# Patient Record
Sex: Female | Born: 1935 | Race: White | Hispanic: No | Smoking: Former smoker
Health system: Southern US, Community
[De-identification: ages and names within clinical notes are randomized; demographics above are authoritative.]

## PROBLEM LIST (undated history)

## (undated) DIAGNOSIS — Z9289 Personal history of other medical treatment: Secondary | ICD-10-CM

## (undated) DIAGNOSIS — G8929 Other chronic pain: Secondary | ICD-10-CM

## (undated) DIAGNOSIS — M1990 Unspecified osteoarthritis, unspecified site: Secondary | ICD-10-CM

## (undated) DIAGNOSIS — K219 Gastro-esophageal reflux disease without esophagitis: Secondary | ICD-10-CM

## (undated) DIAGNOSIS — I480 Paroxysmal atrial fibrillation: Secondary | ICD-10-CM

## (undated) DIAGNOSIS — I2510 Atherosclerotic heart disease of native coronary artery without angina pectoris: Secondary | ICD-10-CM

## (undated) DIAGNOSIS — A0472 Enterocolitis due to Clostridium difficile, not specified as recurrent: Secondary | ICD-10-CM

## (undated) DIAGNOSIS — Z9889 Other specified postprocedural states: Secondary | ICD-10-CM

## (undated) DIAGNOSIS — E785 Hyperlipidemia, unspecified: Secondary | ICD-10-CM

## (undated) DIAGNOSIS — J189 Pneumonia, unspecified organism: Secondary | ICD-10-CM

## (undated) DIAGNOSIS — M109 Gout, unspecified: Secondary | ICD-10-CM

## (undated) DIAGNOSIS — R112 Nausea with vomiting, unspecified: Secondary | ICD-10-CM

## (undated) DIAGNOSIS — D649 Anemia, unspecified: Secondary | ICD-10-CM

## (undated) DIAGNOSIS — I10 Essential (primary) hypertension: Secondary | ICD-10-CM

## (undated) DIAGNOSIS — I509 Heart failure, unspecified: Secondary | ICD-10-CM

## (undated) DIAGNOSIS — I6529 Occlusion and stenosis of unspecified carotid artery: Secondary | ICD-10-CM

## (undated) DIAGNOSIS — J449 Chronic obstructive pulmonary disease, unspecified: Secondary | ICD-10-CM

## (undated) DIAGNOSIS — A020 Salmonella enteritis: Secondary | ICD-10-CM

## (undated) DIAGNOSIS — Z8719 Personal history of other diseases of the digestive system: Secondary | ICD-10-CM

## (undated) DIAGNOSIS — I4891 Unspecified atrial fibrillation: Principal | ICD-10-CM

## (undated) DIAGNOSIS — N189 Chronic kidney disease, unspecified: Secondary | ICD-10-CM

## (undated) DIAGNOSIS — K759 Inflammatory liver disease, unspecified: Secondary | ICD-10-CM

## (undated) DIAGNOSIS — M549 Dorsalgia, unspecified: Secondary | ICD-10-CM

## (undated) DIAGNOSIS — M810 Age-related osteoporosis without current pathological fracture: Secondary | ICD-10-CM

## (undated) HISTORY — DX: Enterocolitis due to Clostridium difficile, not specified as recurrent: A04.72

## (undated) HISTORY — PX: CAROTID ENDARTERECTOMY: SUR193

## (undated) HISTORY — PX: EYE SURGERY: SHX253

## (undated) HISTORY — DX: Pneumonia, unspecified organism: J18.9

## (undated) HISTORY — DX: Paroxysmal atrial fibrillation: I48.0

## (undated) HISTORY — PX: CATARACT EXTRACTION, BILATERAL: SHX1313

## (undated) HISTORY — DX: Anemia, unspecified: D64.9

## (undated) HISTORY — DX: Occlusion and stenosis of unspecified carotid artery: I65.29

## (undated) HISTORY — DX: Salmonella enteritis: A02.0

## (undated) HISTORY — DX: Personal history of other medical treatment: Z92.89

## (undated) HISTORY — PX: OTHER SURGICAL HISTORY: SHX169

---

## 1998-03-09 ENCOUNTER — Encounter: Payer: Self-pay | Admitting: Emergency Medicine

## 1998-03-09 ENCOUNTER — Encounter: Payer: Self-pay | Admitting: Internal Medicine

## 1998-03-09 ENCOUNTER — Emergency Department (HOSPITAL_COMMUNITY): Admission: EM | Admit: 1998-03-09 | Discharge: 1998-03-09 | Payer: Self-pay | Admitting: Internal Medicine

## 1998-05-04 ENCOUNTER — Encounter: Admission: RE | Admit: 1998-05-04 | Discharge: 1998-05-26 | Payer: Self-pay | Admitting: Orthopaedic Surgery

## 1998-07-10 ENCOUNTER — Other Ambulatory Visit: Admission: RE | Admit: 1998-07-10 | Discharge: 1998-07-10 | Payer: Self-pay | Admitting: *Deleted

## 1999-01-12 ENCOUNTER — Ambulatory Visit (HOSPITAL_COMMUNITY): Admission: RE | Admit: 1999-01-12 | Discharge: 1999-01-12 | Payer: Self-pay | Admitting: *Deleted

## 1999-09-09 ENCOUNTER — Encounter: Payer: Self-pay | Admitting: *Deleted

## 1999-09-09 ENCOUNTER — Encounter: Admission: RE | Admit: 1999-09-09 | Discharge: 1999-09-09 | Payer: Self-pay | Admitting: *Deleted

## 1999-09-13 ENCOUNTER — Other Ambulatory Visit: Admission: RE | Admit: 1999-09-13 | Discharge: 1999-09-13 | Payer: Self-pay | Admitting: *Deleted

## 2000-04-12 ENCOUNTER — Encounter: Payer: Self-pay | Admitting: Internal Medicine

## 2000-04-12 ENCOUNTER — Encounter: Admission: RE | Admit: 2000-04-12 | Discharge: 2000-04-12 | Payer: Self-pay | Admitting: Internal Medicine

## 2001-02-16 ENCOUNTER — Ambulatory Visit (HOSPITAL_COMMUNITY): Admission: RE | Admit: 2001-02-16 | Discharge: 2001-02-16 | Payer: Self-pay | Admitting: Gastroenterology

## 2002-03-14 ENCOUNTER — Encounter: Payer: Self-pay | Admitting: Internal Medicine

## 2002-03-14 ENCOUNTER — Encounter: Admission: RE | Admit: 2002-03-14 | Discharge: 2002-03-14 | Payer: Self-pay | Admitting: Internal Medicine

## 2002-05-15 ENCOUNTER — Encounter: Admission: RE | Admit: 2002-05-15 | Discharge: 2002-05-15 | Payer: Self-pay | Admitting: Internal Medicine

## 2002-05-15 ENCOUNTER — Encounter: Payer: Self-pay | Admitting: Internal Medicine

## 2002-07-11 ENCOUNTER — Encounter: Admission: RE | Admit: 2002-07-11 | Discharge: 2002-07-11 | Payer: Self-pay | Admitting: Internal Medicine

## 2002-07-11 ENCOUNTER — Encounter: Payer: Self-pay | Admitting: Internal Medicine

## 2003-07-10 ENCOUNTER — Encounter: Admission: RE | Admit: 2003-07-10 | Discharge: 2003-07-10 | Payer: Self-pay | Admitting: Internal Medicine

## 2003-11-24 ENCOUNTER — Inpatient Hospital Stay (HOSPITAL_BASED_OUTPATIENT_CLINIC_OR_DEPARTMENT_OTHER): Admission: RE | Admit: 2003-11-24 | Discharge: 2003-11-24 | Payer: Self-pay | Admitting: Cardiology

## 2003-12-24 ENCOUNTER — Ambulatory Visit (HOSPITAL_COMMUNITY): Admission: RE | Admit: 2003-12-24 | Discharge: 2003-12-24 | Payer: Self-pay | Admitting: Internal Medicine

## 2004-04-07 ENCOUNTER — Encounter: Admission: RE | Admit: 2004-04-07 | Discharge: 2004-04-07 | Payer: Self-pay | Admitting: Internal Medicine

## 2004-04-10 ENCOUNTER — Encounter: Admission: RE | Admit: 2004-04-10 | Discharge: 2004-04-10 | Payer: Self-pay | Admitting: Internal Medicine

## 2004-04-21 ENCOUNTER — Inpatient Hospital Stay (HOSPITAL_COMMUNITY): Admission: RE | Admit: 2004-04-21 | Discharge: 2004-04-22 | Payer: Self-pay | Admitting: Otolaryngology

## 2004-04-22 ENCOUNTER — Encounter (INDEPENDENT_AMBULATORY_CARE_PROVIDER_SITE_OTHER): Payer: Self-pay | Admitting: Specialist

## 2004-12-14 ENCOUNTER — Encounter: Admission: RE | Admit: 2004-12-14 | Discharge: 2004-12-14 | Payer: Self-pay | Admitting: Internal Medicine

## 2005-05-20 ENCOUNTER — Encounter: Admission: RE | Admit: 2005-05-20 | Discharge: 2005-05-20 | Payer: Self-pay | Admitting: Internal Medicine

## 2006-03-07 HISTORY — PX: JOINT REPLACEMENT: SHX530

## 2007-01-04 ENCOUNTER — Ambulatory Visit: Payer: Self-pay | Admitting: *Deleted

## 2007-04-09 ENCOUNTER — Inpatient Hospital Stay (HOSPITAL_COMMUNITY): Admission: RE | Admit: 2007-04-09 | Discharge: 2007-04-13 | Payer: Self-pay | Admitting: Orthopedic Surgery

## 2007-04-18 ENCOUNTER — Emergency Department (HOSPITAL_COMMUNITY): Admission: EM | Admit: 2007-04-18 | Discharge: 2007-04-18 | Payer: Self-pay | Admitting: Emergency Medicine

## 2007-07-05 ENCOUNTER — Ambulatory Visit: Payer: Self-pay | Admitting: *Deleted

## 2007-12-27 ENCOUNTER — Ambulatory Visit: Payer: Self-pay | Admitting: *Deleted

## 2008-06-20 ENCOUNTER — Ambulatory Visit: Payer: Self-pay | Admitting: *Deleted

## 2008-12-25 ENCOUNTER — Ambulatory Visit: Payer: Self-pay | Admitting: Surgery

## 2008-12-29 ENCOUNTER — Ambulatory Visit: Payer: Self-pay | Admitting: Surgery

## 2009-01-06 ENCOUNTER — Ambulatory Visit: Payer: Self-pay | Admitting: Surgery

## 2009-01-06 ENCOUNTER — Ambulatory Visit (HOSPITAL_COMMUNITY): Admission: RE | Admit: 2009-01-06 | Discharge: 2009-01-06 | Payer: Self-pay | Admitting: Surgery

## 2009-01-19 ENCOUNTER — Ambulatory Visit: Payer: Self-pay | Admitting: Surgery

## 2009-02-06 ENCOUNTER — Inpatient Hospital Stay (HOSPITAL_COMMUNITY): Admission: RE | Admit: 2009-02-06 | Discharge: 2009-02-07 | Payer: Self-pay | Admitting: Surgery

## 2009-02-06 ENCOUNTER — Ambulatory Visit: Payer: Self-pay | Admitting: Surgery

## 2009-02-06 ENCOUNTER — Encounter: Payer: Self-pay | Admitting: Surgery

## 2009-02-23 ENCOUNTER — Ambulatory Visit: Payer: Self-pay | Admitting: Surgery

## 2009-08-31 ENCOUNTER — Ambulatory Visit: Payer: Self-pay | Admitting: Surgery

## 2010-02-15 ENCOUNTER — Ambulatory Visit: Payer: Self-pay | Admitting: Surgery

## 2010-06-08 LAB — TYPE AND SCREEN
ABO/RH(D): O POS
Antibody Screen: POSITIVE
DAT, IgG: NEGATIVE
Donor AG Type: NEGATIVE

## 2010-06-08 LAB — CBC
HCT: 28.3 % — ABNORMAL LOW (ref 36.0–46.0)
Hemoglobin: 11.4 g/dL — ABNORMAL LOW (ref 12.0–15.0)
Hemoglobin: 9.6 g/dL — ABNORMAL LOW (ref 12.0–15.0)
MCHC: 34.1 g/dL (ref 30.0–36.0)
MCV: 94.3 fL (ref 78.0–100.0)
RDW: 13.7 % (ref 11.5–15.5)
WBC: 7.8 10*3/uL (ref 4.0–10.5)

## 2010-06-08 LAB — COMPREHENSIVE METABOLIC PANEL
ALT: 12 U/L (ref 0–35)
AST: 25 U/L (ref 0–37)
Albumin: 3.6 g/dL (ref 3.5–5.2)
Alkaline Phosphatase: 69 U/L (ref 39–117)
Calcium: 9.1 mg/dL (ref 8.4–10.5)
GFR calc Af Amer: 47 mL/min — ABNORMAL LOW (ref >60)
Glucose, Bld: 142 mg/dL — ABNORMAL HIGH (ref 70–99)
Potassium: 4 mEq/L (ref 3.5–5.1)
Sodium: 143 mEq/L (ref 135–145)
Total Protein: 6.3 g/dL (ref 6.0–8.3)

## 2010-06-08 LAB — URINALYSIS, ROUTINE W REFLEX MICROSCOPIC
Glucose, UA: NEGATIVE mg/dL
Hgb urine dipstick: NEGATIVE
Specific Gravity, Urine: 1.013 (ref 1.005–1.030)
pH: 5 (ref 5.0–8.0)

## 2010-06-08 LAB — BLOOD GAS, ARTERIAL
Bicarbonate: 21.3 mEq/L (ref 20.0–24.0)
O2 Saturation: 95.5 %
Patient temperature: 98.6
TCO2: 22.1 mmol/L (ref 0–100)
pH, Arterial: 7.489 — ABNORMAL HIGH (ref 7.350–7.400)
pO2, Arterial: 81.5 mmHg (ref 80.0–100.0)

## 2010-06-08 LAB — BASIC METABOLIC PANEL
Chloride: 107 mEq/L (ref 96–112)
GFR calc non Af Amer: 41 mL/min — ABNORMAL LOW (ref >60)
Potassium: 4.6 mEq/L (ref 3.5–5.1)
Sodium: 137 mEq/L (ref 135–145)

## 2010-06-08 LAB — PROTIME-INR: Prothrombin Time: 12.9 seconds (ref 11.6–15.2)

## 2010-06-09 LAB — POCT I-STAT, CHEM 8
BUN: 24 mg/dL — ABNORMAL HIGH (ref 6–23)
Calcium, Ion: 1.18 mmol/L (ref 1.12–1.32)
Chloride: 114 mEq/L — ABNORMAL HIGH (ref 96–112)
Creatinine, Ser: 1.3 mg/dL — ABNORMAL HIGH (ref 0.4–1.2)
Glucose, Bld: 107 mg/dL — ABNORMAL HIGH (ref 70–99)
TCO2: 23 mmol/L (ref 0–100)

## 2010-07-20 NOTE — Procedures (Signed)
CAROTID DUPLEX EXAM   INDICATION:  Follow up carotid artery disease.   HISTORY:  Diabetes:  No.  Cardiac:  No.  Hypertension:  Yes.  Smoking:  No.  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  CV History:  Asymptomatic.  Amaurosis Fugax No, Paresthesias No, Hemiparesis No.                                       RIGHT             LEFT  Brachial systolic pressure:         168               170  Brachial Doppler waveforms:         Normal            Normal  Vertebral direction of flow:        Antegrade         Antegrade  DUPLEX VELOCITIES (cm/sec)  CCA peak systolic                   78                121  ECA peak systolic                   282               111  ICA peak systolic                   294               103  ICA end diastolic                   69                17  PLAQUE MORPHOLOGY:                  Mixed             Calcific  PLAQUE AMOUNT:                      Moderate/severe   Mild  PLAQUE LOCATION:                    ICA/ECA           ICA   IMPRESSION:  1. High-end 60-79% stenosis of the right internal carotid artery.  2. 1-39% stenosis of the left internal carotid artery.  3. No significant change noted when compared to the previous      examination on 07/05/2007.   ___________________________________________  P. Liliane Bade, M.D.   CH/MEDQ  D:  12/27/2007  T:  12/28/2007  Job:  [id number]

## 2010-07-20 NOTE — Procedures (Signed)
CAROTID DUPLEX EXAM   INDICATION:  Followup carotid artery disease.   HISTORY:  Diabetes:  No.  Cardiac:  No.  Hypertension:  Yes.  Smoking:  No.  Previous Surgery:  Bilateral carotid endarterectomies 1998, left neck  tumor removed February 2006.  CV History:  No.  Amaurosis Fugax No, Paresthesias No, Hemiparesis No                                       RIGHT               LEFT  Brachial systolic pressure:         180                 170  Brachial Doppler waveforms:         WNL                 WNL  Vertebral direction of flow:        Antegrade           Antegrade  DUPLEX VELOCITIES (cm/sec)  CCA peak systolic                   72                  97  ECA peak systolic                   521                 102  ICA peak systolic                   403                 100  ICA end diastolic                   101                 23  PLAQUE MORPHOLOGY:                  Mixed               Calcific  PLAQUE AMOUNT:                      Moderate/severe     Mild  PLAQUE LOCATION:                    ICA/CCA/bifurcation ICA   IMPRESSION:  1. Right ICA shows evidence of 60-79% stenosis (high end of range).  2. Velocities appear higher than previous study, however, no category      change.  3. Left ICA shows evidence of 1-39% stenosis.  4. Right ECA stenosis.   ___________________________________________  P. Liliane Bade, M.D.   AS/MEDQ  D:  06/20/2008  T:  06/20/2008  Job:  [id number]

## 2010-07-20 NOTE — Op Note (Signed)
NAME:  [name] [name], [name] [name]                ACCOUNT NO.:  [id number]   MEDICAL RECORD NO.:  [id number]          PATIENT TYPE:  INP   LOCATION:  NA                           FACILITY:  [facility]   PHYSICIAN:  Feliberto Gottron. Turner Daniels, M.D.   DATE OF BIRTH:  [birth year]   DATE OF PROCEDURE:  04/09/2007  DATE OF DISCHARGE:                               OPERATIVE REPORT   PREOPERATIVE DIAGNOSIS:  End-stage arthritis of right knee,  tricompartmental   POSTOPERATIVE DIAGNOSIS:  End-stage arthritis of right knee,  tricompartmental   PROCEDURE:  Right total knee arthroplasty, using DePuy Sigma RP  components, 2 femur, 3 tibia, 10 PF spacer, 35 mm patellar button, all  cemented, double batch of DePuy HV cement with 1500 mg of Zinacef.   SURGEON:  Feliberto Gottron.  Turner Daniels, M.D.   FIRST ASSISTANT:  Erskine Squibb B. Su Hilt, P.A.-C.   ANESTHETIC:  General endotracheal.   ESTIMATED BLOOD LOSS:  Minimal.   FLUID REPLACEMENT:  1 liter of crystalloid.   DRAINS PLACED:  Foley catheter, and 2 medium Hemovacs.   URINE OUTPUT:  200 mL.   TOURNIQUET TIME:  1 hour.   INDICATIONS FOR PROCEDURE:  An 77 year old woman with end-stage, bone-on-  bone arthritic changes of her right knee, who has failed conservative  treatment with antiinflammatory medicines, exercises, observation,  cortisone injections, and now desires elective total knee arthroplasty  to decrease pain and increase function.  Risks and benefits of surgery  were discussed, and questions answered.   DESCRIPTION OF PROCEDURE:  The patient was identified by armband and  taken to the operating room at [facility].  Appropriate  anesthetic monitors were attached, and general endotracheal anesthesia  was induced.  She did receive IV antibiotics in the preoperative area.  A Foley catheter was inserted.  Tourniquet applied high to the right  thigh.  Lateral post and foot positioner were applied to the table, and  the right lower extremity prepped and draped in the usual  sterile  fashion from the ankle to the tourniquet.  Limb wrapped with an Esmarch  bandage, tourniquet inflated to 350 mmHg.  We began the procedure by  making an anterior midline incision centered over the patella, starting  a handbreadth above the patella and going 1 cm medial to and 2 cm distal  to the tibial tubercle.  Small bleeders in the skin and subcutaneous  tissue were identified and cauterized, and a medial parapatellar  arthrotomy was accomplished.  The patella was everted.  The prepatellar  fat pad was resected.  Superficial medial collateral ligament was  elevated off the proximal tibia, going from anterior to posterior, and  leaving it intact distally about 5 cm down the metaphyseal flare.  The  knee was then hyperflexed, allowing [place] to remove the anterior one-half  of the menisci and to further peel the superficial medial collateral  ligament down back to the posteromedial corner.  At this point, a  posteromedial Z retractor was placed, a McHale through the notch and a  lateral Personal assistant.  This exposed the proximal tibia.  The  cruciate  ligaments were resected with the electrocautery, and we entered  the proximal tibia between the tibial spines with the DePuy step drill,  followed by the IM rod with a 0-degree posterior slope cutting guide.  This allowed resection of about 8-9 mm of bone medially and laterally,  since the arthritis was evenly distributed between the compartments,  using a 0-degree posterior slope cutting guide which was pinned in place  and protecting the posterior structures with the retractors.  We then  entered the distal femur with the step drill 2 mm anterior to the PCL  origin, followed by an IM rod and a 5-degree right distal femoral  cutting guide.  This was pinned in place along the epicondylar axis,  allowing [place] to remove 11 mm of the distal femur.  We then sized for a #2  femoral component using the posterior referencing cutting guide  and  pinned it in neutral rotation, and now applied the #2 chamfer cutting  guide and performed the anterior, posterior, and chamfer cuts without  difficulty, followed by the DePuy box cut.  The patella was measured at  24 mm and was felt to take either a 32 or a 35 button.  We set the  cutting guide at 16 and removed the posterior 8-8.5 mm of the patella  and drilled for a 35 button.  Once again, the knee was hyperflexed.  We  sized for a #3 tibial baseplate which was pinned into place, followed by  the smokestack reamer guide and the conical reaming.  We then hammered  into place the Delta fit keel and bullet tip and removed the pins from  the tibial baseplate trial.  A #2 right femoral component trial was then  hammered into place, and the lugs were drilled.  We inserted a 10 mm  sigma RP bearing, and the knee was reduced.  It came to full extension,  flexed to 130 degrees, with good ligamentous stability, and the patella  was noted to track with no thumb pressure whatsoever.  At this point,  the trial components were removed.  All bony surfaces were waterpiked  clean, dried with suction and sponges, and a double batch of DePuy HV  cement was mixed with 1500 mg Zinacef and applied to all bony and  metallic mating surfaces except for the posterior condyles of the femur  itself.  In order, we hammered into place a #3 tibial baseplate and  removed excess cement, a 2 right femoral component and removed excess  cement.  A 35 mm patellar button, and again removed excess cement.  The  10 mm PF spacer was then snapped into place and the knee once again  reduced, came to full extension, was held in 5 degrees of flexion, and  then pressure applied to the foot to compress the cement as it hardened.  Medium Hemovac drains were placed in the wound from a lateral approach.  and the wound was once again waterpiked clean.  After the cement had  cured, we took the knee through a range of motion with the  patella  reduced to make sure there were no tracking problems.  Again, no thumb  pressure was required.  The medial parapatellar arthrotomy was closed  with a running #1 Vicryl suture, the subcutaneous tissue with 0 and 2-0  undyed Vicryl suture, and the skin with skin staples.  A dressing of  Xeroform, 4 x 4 dressing, sponges, Webril, and Ace wrap were applied.  Tourniquet let down.  The patient was awakened and taken to the recovery  room without difficulty.      Feliberto Gottron. Turner Daniels, M.D.  Electronically Signed     FJR/MEDQ  D:  04/09/2007  T:  04/09/2007  Job:  [id number]

## 2010-07-20 NOTE — Procedures (Signed)
CAROTID DUPLEX EXAM   INDICATION:  Followup carotid artery disease.   HISTORY:  Diabetes:  No.  Cardiac:  No.  Hypertension:  Yes.  Smoking:  No.  Previous Surgery:  Right carotid endarterectomy in 1998, left carotid  endarterectomy in 1998, left neck tumor removed in February of 2006.  CV History:  No.  Amaurosis Fugax No, Paresthesias No, Hemiparesis No                                       RIGHT             LEFT  Brachial systolic pressure:         178               174  Brachial Doppler waveforms:         WNL               WNL  Vertebral direction of flow:        Antegrade         Antegrade  DUPLEX VELOCITIES (cm/sec)  CCA peak systolic                   64                77  ECA peak systolic                   182               75  ICA peak systolic                   358               84  ICA end diastolic                   92                21  PLAQUE MORPHOLOGY:                  Mixed             Calcific  PLAQUE AMOUNT:                      Large             Mild  PLAQUE LOCATION:                    ICA, ECA          ICA   IMPRESSION:  1. High end 60 to 79% stenosis of the right proximal ICA.  2. 20 to 39% stenosis of the left proximal ICA.  3. Right ECA stenosis noted.   ___________________________________________  P. Liliane Bade, M.D.   CH/MEDQ  D:  07/05/2007  T:  07/05/2007  Job:  [id number]

## 2010-07-20 NOTE — Procedures (Signed)
CAROTID DUPLEX EXAM   INDICATION:  Follow up carotid artery disease.   HISTORY:  Diabetes:  No.  Cardiac:  No.  Hypertension:  Yes.  Smoking:  No.  Previous Surgery: Redo  right carotid 02/06/2009. Bilateral carotid  endarterectomy in 1998.  Neck tumor removed February, 2006.  CV History:  No.  Amaurosis Fugax No, Paresthesias No, Hemiparesis No.                                       RIGHT             LEFT  Brachial systolic pressure:         128               130  Brachial Doppler waveforms:         WNL               WNL  Vertebral direction of flow:        Antegrade         Antegrade  DUPLEX VELOCITIES (cm/sec)  CCA peak systolic                   93                59  ECA peak systolic                   144               88  ICA peak systolic                   79                101  ICA end diastolic                   23                18  PLAQUE MORPHOLOGY:                                    Heterogenous  PLAQUE AMOUNT:                                        Mild  PLAQUE LOCATION:                                      ICA   IMPRESSION:  1. Patent right internal carotid artery status post carotid      endarterectomy with no evidence of restenosis.  2. Left internal carotid artery suggests 20% to 39% stenosis.  3. Antegrade flow in bilateral vertebrals.       ___________________________________________  V. Charlena Cross, MD   CB/MEDQ  D:  08/31/2009  T:  08/31/2009  Job:  [id number]

## 2010-07-20 NOTE — Assessment & Plan Note (Signed)
OFFICE VISIT   [name] [name], [name] [name]  DOB:  Jun 25, 1923                                       01/19/2009  ZOXWR#:[id number]   REASON FOR VISIT:  Follow up carotid stenosis.   HISTORY:  This is an 77 year old patient, former patient of Dr. Madilyn Fireman,  who is status post bilateral carotid endarterectomy in July and October  1998.  The right side required resection of a redundant internal carotid  artery.  Both arteries were repaired with a patch angioplasty, and they  were both done for asymptomatic disease.  She has had progression of  ultrasound on the right side.  She was taken for a carotid arteriogram  and found have greater than 90% stenosis on the right.  Her anatomy was  too tortuous to proceed with carotid stenting; therefore, she comes back  today for discussion of carotid endarterectomy.  She has had no new  neurological symptoms.   REVIEW OF SYSTEMS:  She does have shortness of breath with exertion,  pain in her legs with walking and some arthritis and joint pain as well  as wheezing.  Her other review of systems is negative.   PAST MEDICAL HISTORY:  Unchanged, including hypertension,  hypercholesterolemia, COPD.   SOCIAL HISTORY:  She continues to be a nonsmoker.  She quit 25 years  ago.   PHYSICAL EXAMINATION:  Heart rate 63, blood pressure 128/64, temperature  is 98.  She is well-appearing, in no distress.  Her neck incisions are  healed.  No carotid bruits.  Cardiovascular:  Regular rate and rhythm.  Abdomen is soft.  Neurologically she is intact without focal deficits.  Skin is without rash.  Psych:  She is alert and x3.  Musculoskeletal no  major deformities.   After again reviewing her carotid angiogram, we have had a long  discussion and have decided to proceed with a redo right carotid  endarterectomy.  We discussed the slightly-increased risk of nerve  injury and the risk of stroke with operation.  We have decided to  schedule this for  Friday, December 3.  All the patient's questions were  answered today.  She will continue her aspirin.  She is going to get a  Myoview for cardiac clearance.   Jorge Ny, MD  Electronically Signed   VWB/MEDQ  D:  01/19/2009  T:  01/20/2009  Job:  [id number]   cc:   Wilson Singer, M.D.  Dr. Ludwig Clarks

## 2010-07-20 NOTE — Assessment & Plan Note (Signed)
OFFICE VISIT   Bitton, Numa F  DOB:  September 06, 1923                                       02/23/2009  MVHQI#:[id number]   REASON FOR VISIT:  Followup.   HISTORY:  This is an 77 year old female who is status post bilateral  carotid endarterectomies in the late 1990s.  She has developed a  recurrence on the right side.  Based on her anatomy, she was not a  candidate for carotid stent.  Therefore, she underwent redo right  carotid endarterectomy with patch angioplasty.  The patient tolerated  her procedure well and was discharged home the following day.  She comes  back in today for follow-up.  She has not had any neurologic symptoms.   Her incision is well-healed.  The patient will be scheduled to come back  to the [facility] clinic in 6 months.  She will be placed back on our P scan  ultrasound protocol.  She has moderate disease on the left side, which  will need to be followed as well as the right.     Jorge Ny, MD  Electronically Signed   VWB/MEDQ  D:  02/23/2009  T:  02/25/2009  Job:  [id number]   cc:   Wilson Singer, M.D.  Dr. Ludwig Clarks

## 2010-07-20 NOTE — Procedures (Signed)
CAROTID DUPLEX EXAM   INDICATION:  Followup, carotid artery disease.   HISTORY:  Diabetes:  No.  Cardiac:  No.  Hypertension:  Yes.  Smoking:  No.  Previous Surgery:  Right carotid endarterectomy with DPA on 12/18/1996,  left carotid endarterectomy on 09/10/1996, both by Dr. Madilyn Fireman.  Patient  is also status post a removal of a left neck tumor in February, 2006.  CV History:  Amaurosis Fugax No, Paresthesias No, Hemiparesis No                                       RIGHT             LEFT  Brachial systolic pressure:         120               118  Brachial Doppler waveforms:         Biphasic          Biphasic  Vertebral direction of flow:        Antegrade         Antegrade  DUPLEX VELOCITIES (cm/sec)  CCA peak systolic                   64                95  ECA peak systolic                   218               86  ICA peak systolic                   402               99  ICA end diastolic                   89                26  PLAQUE MORPHOLOGY:                  Calcified         Calcified  PLAQUE AMOUNT:                      Large             Mild                      PLAQUE LOCATION:  ICA, ECA    ICA  Both ICAs are noted to be tortuous.   IMPRESSION:  1. 20-39% left internal carotid artery stenosis, status post      endarterectomy.  2. 60-79% right internal carotid artery stenosis (slight increase in      velocities from previous examination).  3. Right external carotid artery stenosis.  4. Study technically difficult due to tortuous internal carotid      arteries and high bifurcation bilaterally.   ___________________________________________  P. Liliane Bade, M.D.   DP/MEDQ  D:  01/04/2007  T:  01/04/2007  Job:  [id number]

## 2010-07-20 NOTE — Procedures (Signed)
CAROTID DUPLEX EXAM   INDICATION:  Followup carotid artery disease.   HISTORY:  Diabetes:  No  Cardiac:  No  Hypertension:  Yes  Smoking:  No  Previous Surgery:  Bilateral CEA 1998, neck tumor removed February 2006  CV History:  Asymptomatic  Amaurosis Fugax No, Paresthesias No, Hemiparesis No                                       RIGHT             LEFT  Brachial systolic pressure:         160               150  Brachial Doppler waveforms:         WNL               WNL  Vertebral direction of flow:        Antegrade         Antegrade  DUPLEX VELOCITIES (cm/sec)  CCA peak systolic                   38 (high resistance)                82  ECA peak systolic                   >558              93  ICA peak systolic                   >568              118  ICA end diastolic                   266               31  PLAQUE MORPHOLOGY:                  Mixed             Mixed  PLAQUE AMOUNT:                      Severe            Mild  PLAQUE LOCATION:                    ICA/ECA/CCA       ICA   IMPRESSION:  1. Right internal carotid artery shows evidence of 80% to 99%      stenosis, an increase from previous study.  2. Left internal carotid artery shows evidence of 40% to 59% stenosis.  3. Right external carotid artery high-grade stenosis.  4. I spoke with Dr. Edilia Bo concerning results.  An appointment was      scheduled to see Dr. Myra Gianotti for Monday.   ___________________________________________  V. Charlena Cross, MD   AS/MEDQ  D:  12/25/2008  T:  12/26/2008  Job:  [id number]

## 2010-07-20 NOTE — Procedures (Signed)
CAROTID DUPLEX EXAM   INDICATION:  Followup evaluation of carotid artery disease.   HISTORY:  Diabetes:  No  Cardiac:  No  Hypertension:  Yes  Smoking:  No  Previous Surgery:  Redo, right carotid 02/06/2009; bilateral carotid  endarterectomy in 1998  CV History:  No  Amaurosis Fugax No, Paresthesias No, Hemiparesis No                                       RIGHT             LEFT  Brachial systolic pressure:         133               127  Brachial Doppler waveforms:         Normal            Normal  Vertebral direction of flow:        Antegrade         Antegrade  DUPLEX VELOCITIES (cm/sec)  CCA peak systolic                   72                69  ECA peak systolic                   88                114  ICA peak systolic                   76                122  ICA end diastolic                   21                22  PLAQUE MORPHOLOGY:                                    Heterogeneous  PLAQUE AMOUNT:                      None              Mild  PLAQUE LOCATION:                                      Left ICA   IMPRESSION:  1. Patent right ICA status post carotid endarterectomy with no      evidence of restenosis.  2. Left ICA suggestive of a 1% to 39% stenosis.    ___________________________________________  V. Charlena Cross, MD   EM/MEDQ  D:  02/16/2010  T:  02/16/2010  Job:  [id number]

## 2010-07-20 NOTE — Assessment & Plan Note (Signed)
OFFICE VISIT   [name], [name] F  DOB:  October 13, 1923                                       12/29/2008  ZOXWR#:[id number]   REASON FOR VISIT:  Follow up carotid stenosis.   HISTORY:  This is an 77 year old seen.  She is a former patient of Dr.  Madilyn Fireman.  She has undergone bilateral carotid endarterectomy in July and  October of 1998.  The right side required resection of a redundant  internal carotid artery.  Both arteries were repaired with patch  angioplasty.  These were done in the setting of asymptomatic disease.  She recently had an ultrasound which showed progression.  She comes back  in today for discussion.  She remains asymptomatic.  She denies  numbness, weakness, amaurosis fugax, slurring of her speech.  She did  have an episode today of presyncope, which she described as vision in  both eyes going dark.  This lasted several seconds and then resolved.  Otherwise, she has been without incident since Dr. Madilyn Fireman last saw her.  She continues to suffer from hypertension, which is managed medically  and has been under good control.  Her cholesterol has been under good  control with medication.  She does have COPD.  This is from a history of  smoking.  She remains abstinent from cigarettes.   REVIEW OF SYSTEMS:  Positive for shortness breath with exertion,  wheezing, pain in her legs with walking, dizziness, blackouts,  arthritis, joint pain.  Otherwise all other review of systems is  negative as documented in the encounter form.   SOCIAL HISTORY:  She continues to be abstinent from cigarettes.   PHYSICAL EXAMINATION:  Heart rate 71, blood pressure 165/73,  respirations are 24.  General:  Well-appearing, well-nourished, in no  distress.  HEENT:  Normocephalic, atraumatic.  Pupils equal.  Sclerae  anicteric.  Neck:  Supple.  No JVD.  No carotid bruits.  Well healed  carotid incision.  Lungs are clear bilaterally.  Cardiovascular is a  regular rate and  rhythm.  Abdomen is soft, nontender.  Neurologic:  She  has a nonfocal exam.  Skin is without rash.  Psych:  She is alert and  oriented x3.   DIAGNOSTIC STUDIES:  Her carotid ultrasound was independently reviewed  by me.  Her right side has progressed to greater than 80% with peak  systolic pressure greater than 568 and end-diastolic velocities greater  than 66.  The left side shows 40% to 59% stenosis.   ASSESSMENT/PLAN:  Asymptomatic recurrent right carotid stenosis.  I had  a long discussion with the patient and her daughter about options at  this time.  We discussed that the data indicates that being greater than  80, she is not a favorable candidate for carotid stenting; however, she  is a redo candidate.  Therefore, this may be a more feasible option for  her.  Regardless, I think the next step of the management is to proceed  with a diagnostic cerebral angiogram.  This has been scheduled for  Tuesday, November 2.  Based on the findings of the arteriogram we will  make recommendations for the next step in management, whether that be  continued observation versus redo carotid endarterectomy versus carotid  stent.   Jorge Ny, MD  Electronically Signed   VWB/MEDQ  D:  12/29/2008  T:  12/30/2008  Job:  [id number]

## 2010-07-20 NOTE — Discharge Summary (Signed)
NAME:  [name] [name], [name] [name]                ACCOUNT NO.:  [id number]   MEDICAL RECORD NO.:  [id number]          PATIENT TYPE:  INP   LOCATION:  5036                         FACILITY:  [facility]   PHYSICIAN:  Feliberto Gottron. Turner Daniels, M.D.   DATE OF BIRTH:  February 27, 1924   DATE OF ADMISSION:  04/09/2007  DATE OF DISCHARGE:  04/13/2007                               DISCHARGE SUMMARY   The patient is an 77 year old woman who has had end-stage bone on bone  arthritic changes of her right knee, having failed conservative  treatment with anti-inflammatory medications, exercise, observation,  cortisone injections and physical therapy, now desires elective total  knee arthroplasty to decrease pain and increase function.  Risks and  benefits of the surgery were discussed and all questions were answered  prior to her admission.   ALLERGIES:  NO KNOWN DRUG ALLERGIES.   MEDICATIONS ON ADMISSION:  Evista, aspirin, Lipitor, Lasix, atenolol,  allopurinol, fish oil, folic acid, flax see and calcium.  She had been  taking Actonel until recently.   PAST MEDICAL HISTORY:  1. Usual childhood diseases.  2. Increased lipids.  3. Hypertension.  4. Peptic ulcer disease.  5. Osteoporosis.   PAST SURGICAL HISTORY:  Carotid endarterectomy.  No difficulties with  GET.   SOCIAL HISTORY:  No tobacco.  Positive ethanol.  One drink every six  months.  No IV drug abuse.  She is widowed and lives alone.   FAMILY HISTORY:  Mother died of hypertension.  Father's history unknown.   REVIEW OF SYSTEMS:  Positive for glasses, abnormal heart beat.  She  denies any shortness of breath, chest pain or recent illness.   PHYSICAL EXAMINATION:  VITAL SIGNS:  Temperature 97.6, pulse 56,  respirations 16, blood pressure 154/65.  GENERAL APPEARANCE:  Patient is a 5 feet, 380 pound female.  HEENT:  Head is normocephalic and atraumatic.  Ears:  TMs clear.  Eyes:  Pupils are equal, round, reactive to light and accommodation.  Nose  patent. Throat  benign.  NECK:  Supple.  Full range of motion.  LUNGS:  Clear to auscultation and percussion.  CARDIOVASCULAR:  Regular rate and rhythm.  ABDOMEN:  Soft and nontender.  EXTREMITIES:  Right knee range of motion 0-110 degrees, stable  ligamentously, 5 degrees of varus __________ .  Neurovascularly intact.  SKIN:  Intact.   X-rays show bone on bone changes with erosion of the medial compartment  of the right knee.   LABORATORY DATA:  Preoperative labs including CBC, CMET, chest x-ray,  EKG, PT and PTT were all within normal limits with the exception of  glucose of 129, BUN 24.  GPR of 48.  Of note, the patient also received  cardiac clearance, having undergone a cardiac catheterization in 2005  and updated with the physician to be considered at reasonable risk after  carotid Duplex exam as well.   HOSPITAL COURSE:  On the day of admission, the patient was taken to the  operating room at [facility] where she underwent a  right total knee arthroplasty using Dupuy Sigma RP components.  A #  3  tibia, 10 PF spacer, 35 mm patellar button all cemented with a double  batch of POHB cement with Zinacef imbedded.  Medium Hemovac double arm  was placed into the knee.  Foley catheter was placed perioperatively.  Patient was placed on postoperative Coumadin prophylaxis per pharmacy  protocol with Lovenox to bridge until she became therapeutic.  She was  placed on postoperative PCA Dilaudid for pain control.  Physical therapy  was begun in the recovery room with CPM.   On postoperative day #1, the patient was awake and alert, no nausea or  vomiting, taking p.o.'s well.  Vital signs were stable.  Hemoglobin was  9.5, wbc 8.  PT 13.5.  Dressing with scant drainage.  Hemovac was  quiescent and discontinued, otherwise stable.   On postoperative day #2, patient was complaining of moderate pain, T-max  99, INR 1.3.  Hemoglobin 8.9, wbc 7.1.  Dressing was changed.  Wound was  benign.   Physical therapy and occupational therapy evaluations felt the  patient would benefit from skilled rehab at a long-term facility so  skilled nursing facility bed was sought through proper channels.  PCA  was discontinued.   On postoperative day #3, patient was complaining of some itching.  She  was afebrile.  Vital signs were stable.  Hemoglobin 8.2 without any  symptoms or signs of anemia.  INR 1.6.  Dressing was dry.  Wound was  benign.  No obvious rashes but it was felt she might be irritated by the  Percocet so Vicodin was changed over for her pain medication with  apparently good results.   On postoperative day #4, the patient was without complaint.  She was  eating and drinking without difficulties, voiding without difficulties.  She was afebrile.  Vital signs were stable.  Hemoglobin 8.1, wbc 6.1.  INR 1.7.  Dressing was dry.  Wound was benign.  Calf was soft and  nontender.  She was otherwise medically stable and orthopedically  improved and was approved to be discharged to a skilled nursing bed at  [facility] for continued rehab.   ACTIVITY:  At the time of discharge her activities are weightbearing as  tolerated with total knee precautions using a walker.  She should  continue with CPM on current settings advancing by 10 degrees daily  until she is easily flexing to 90 degrees, no more than five hours per  day.   DISCHARGE MEDICATIONS:  1. Evista 60 mg one p.o. daily.  2. Lipitor 40 mg one p.o. daily.  3. Lasix 40 mg one p.o. daily.  4. Atenolol 50 mg one p.o. nightly.  5. Zyloprim 100 mg one p.o. daily.  6. Folic acid 0.5 mg one p.o. daily.  7. Os-Cal plus D one p.o. daily.  8. Pharmacy will continue Coumadin protocol with a target INR of 1.5-2      for two weeks postoperatively.  9. She may discontinue her Lovenox.  10.Colace 100 mg b.i.d.  11.Benadryl 25 mg p.o. p.r.n. itching.  12.Senokot or other laxative of choice as needed for constipation.   13.Fleets enema as needed.  14.Vicodin one to two q.4h. p.r.n. pain.  15.Tylenol one to two q.4h. p.r.n. temperature greater than 101.  16.Robaxin 500 mg one p.o. q.6h. p.r.n. spasm.  17.Restoril 15 mg one p.o. nightly p.r.n. sleep.   WOUND CARE:  Patient's dressing changes are daily or as needed with  simple gauze and Ace.   DIET:  Regular.   FOLLOW UP:  Patient should return to clinic in approximately one week's  time with Dr. Gean Birchwood, calling [phone] to make that appointment.  She should, of course, return sooner should she have any increased  drainage, any pain that is not well controlled by oral pain medication  or any temperature greater than 101.   DIAGNOSIS:  End-stage degenerative joint disease of the right knee.   PROCEDURE:  Right total knee arthroplasty.      Laural Benes. Jannet Mantis.      Feliberto Gottron. Turner Daniels, M.D.  Electronically Signed    JBR/MEDQ  D:  04/13/2007  T:  04/13/2007  Job:  [id number]

## 2010-07-23 NOTE — Cardiovascular Report (Signed)
NAMEELECTRA, PALADINO                          ACCOUNT NO.:  [id number]   MEDICAL RECORD NO.:  [id number]                   PATIENT TYPE:  OIB   LOCATION:  6501                                 FACILITY:  [facility]   PHYSICIAN:  Peter M. Swaziland, M.D.               DATE OF BIRTH:  03/29/1923   DATE OF PROCEDURE:  11/24/2003  DATE OF DISCHARGE:  11/24/2003                              CARDIAC CATHETERIZATION   INDICATION FOR PROCEDURE:  The patient is a 77 year old white female with  history of vascular disease status post bilateral carotid endarterectomy.  She has a history of hypertension, hyperlipidemia.  She presents with  atypical chest pain.  Adenosine Cardiolite study was suggestive of basal  lateral wall ischemia.   ACCESS:  Via the right femoral artery using standard Seldinger technique.   PROCEDURE:  Left heart catheterization, coronary and left ventricular  angiography.   EQUIPMENT:  4 French 4 cm right and left Judkins catheter, 4 French pigtail  catheter, 4 French arterial sheath.   MEDICATIONS:  Local anesthesia with 1% Xylocaine.   CONTRAST:  100 mL of Omnipaque.   HEMODYNAMIC DATA:  Aortic pressure 161/69 with mean of 105.  Left  ventricular pressure was 164 with EDP of 18 mmHg.   ANGIOGRAPHIC DATA:  The left coronary artery arises and distributes  normally.  The left main coronary artery is very short without significant  disease.   The left anterior descending artery has moderate disease proximally less  than 20%.  There is a small first diagonal branch which also has 20-30%  disease proximally.   The left circumflex coronary artery is normal.   The right coronary artery is a very large, dominant vessel.  At the ostium  there is focal severe calcification of the ostium with approximately 40-50%  ostial stenosis.  There is 20% narrowing in the proximal right coronary  artery.  The remainder of the vessel is without significant disease.   LEFT VENTRICULAR  ANGIOGRAPHY:  Performed in the RAO view demonstrates normal  left ventricular size and contractility with normal systolic function.  Ejection fraction is estimated at 60%.  There is no mitral regurgitation.   FINAL INTERPRETATION:  1.  Nonobstructive atherosclerotic coronary artery disease.  2.  Normal left ventricular function.   PLAN:  Would continue medical therapy and risk factor modification.      PMJ/MEDQ  D:  11/24/2003  T:  11/24/2003  Job:  [id number]   cc:   Wilson Singer, M.D.  [address]  [place]  [place] [address]  Fax: [phone]

## 2010-07-23 NOTE — H&P (Signed)
NAMELEATHIE, WEICH                          ACCOUNT NO.:  [id number]   MEDICAL RECORD NO.:  [id number]                   PATIENT TYPE:  AMB   LOCATION:                                       FACILITY:  [facility]   PHYSICIAN:  Peter M. Swaziland, M.D.               DATE OF BIRTH:  [birth year]   DATE OF ADMISSION:  11/24/2003  DATE OF DISCHARGE:                                HISTORY & PHYSICAL   HISTORY OF PRESENT ILLNESS:  [name] is a pleasant 77 year old white female  who presents with a 28-month history of progressive dyspnea.  She also  complains of a pain in her mid-back between her shoulder blades.  These  symptoms are exertional and are relieved with rest.  The patient has had  prior Cardiolite study in 2001 which was unremarkable.  She had a repeat  adenosine Cardiolite study on November 18, 2003.  This was abnormal,  showing evidence of lateral wall ischemia.  Her ejection fraction was normal  at 58%.  She is now admitted for cardiac catheterization.   PAST MEDICAL HISTORY:  1.  Hypertension.  2.  Hypercholesterolemia.  3.  Carotid arterial disease, status post bilateral carotid      endarterectomies.  4.  Osteopenia.  5.  Status post repair of rectal fistula.  6.  Status post wrist fracture.   ALLERGIES:  She has no known allergies.   MEDICATIONS:  Medications include:  1.  Atenolol daily.  2.  Lipitor 40 mg per day.  3.  Actonel weekly.  4.  Aspirin daily.   SOCIAL HISTORY:  The patient quit smoking 10 years ago.  She has 2 children.   FAMILY HISTORY:  One brother has had previous stent.  One sister died of  myocardial infarction.   REVIEW OF SYSTEMS:  Review of systems is as noted in HPI, otherwise,  negative.   PHYSICAL EXAM:  GENERAL:  On physical exam, the patient is an obese white  female in no apparent distress.  VITAL SIGNS:  Blood pressure is 130/70 and equal in both arms, pulse 72 and  regular, weight is 183, respirations are 20.  HEENT:  Pupils equal,  round, reactive to light and accommodation.  Extraocular movements are full.  Oropharynx is clear.  NECK:  Neck is supple without JVD, adenopathy or thyromegaly.  She has  bilateral carotid endarterectomy scars with bilateral carotid bruits.  She  also has a left subclavian bruit.  LUNGS:  Lungs are clear to auscultation and percussion.  CARDIAC:  Cardiac exam reveals regular rate and rhythm without murmur, rub,  gallop or click.  ABDOMEN:  Abdomen is soft, obese and nontender, without masses or  hepatosplenomegaly.  EXTREMITIES:  Femoral and pedal pulses are 2+ and symmetric.  There is no  edema.  NEUROLOGIC:  Exam is nonfocal.  SKIN:  Skin is warm and dry.   LABORATORY DATA:  A chest x-ray shows no active disease.   ECG is normal at rest.   Coagulations are normal.  White count is 5500, hemoglobin 11.8, hematocrit  36.4, platelets 207,000.  Sodium is 146, potassium 4.4, chloride 104, CO2  33, BUN is 18, creatinine 1.2, glucose of 116.   IMPRESSION:  1.  Progressive dyspnea.  2.  Abnormal Cardiolite study showing evidence of lateral wall ischemia.  3.  Status post bilateral carotid endarterectomies.  4.  Hypertension.  5.  Hypercholesterolemia.  6.  Family history of early coronary disease.   PLAN:  We will proceed with cardiac catheterization, with further therapy  pending these results.                                                 Peter M. Swaziland, M.D.    PMJ/MEDQ  D:  11/21/2003  T:  11/22/2003  Job:  [id number]   cc:   Wilson Singer, M.D.  [address]  [place]  [place] [address]  Fax: [phone]

## 2010-07-23 NOTE — Op Note (Signed)
NAME:  Feld, Marthann                ACCOUNT NO.:  [id number]   MEDICAL RECORD NO.:  [id number]          PATIENT TYPE:  INP   LOCATION:  NA                           FACILITY:  [facility]   PHYSICIAN:  Christopher E. Ezzard Standing, M.D.DATE OF BIRTH:  1923/08/01   DATE OF PROCEDURE:  04/21/2004  DATE OF DISCHARGE:                                 OPERATIVE REPORT   PREOPERATIVE DIAGNOSIS:  Left parotid mass with left neck lymph nodes.   POSTOPERATIVE DIAGNOSIS:  Left parotid mass with left neck lymph nodes.   OPERATION:  Left superficial parotidectomy with facial nerve dissection and  limited superior left neck dissection with frozen section.   SURGEON:  Kristine Garbe. Ezzard Standing, M.D.   ASSISTANT SURGEON:  Hermelinda Medicus, M.D.   ANESTHESIA:  General endotracheal anesthesia.   COMPLICATIONS:  None.   BRIEF CLINICAL NOTE:  [name] [name] is a 77 year old female who has noted  some nodules on the left side of her parotid region of left neck for the  last several months.  She had an MRI scan and on review of the MRI scan,  this was suggestive of a possible neoplasm with metastasis to left neck  nodes and paranormal invasion.  She is taken to the operating room at this  time for left parotidectomy, possible neck dissection, for diagnosis and  treatment of left parotid mass and left neck node enlargement.   DESCRIPTION OF PROCEDURE:  After adequate endotracheal anesthesia, left neck  was prepped with Betadine solution and draped out with sterile towels.  A  standard parotid incision was made which extended more inferiorly into the  neck.  the subcutaneous flaps were elevated over the parotid fascia and the  parotid nodularity.  There were several nodules within the parotid gland as  well as several nodules extended down into the upper jugular chain of lymph  nodes and over the submandibular region.  Prior to performing the neck  dissection, two of these nodules were removed and sent for frozen  section.  While awaiting the frozen section, dissection was then carried down to the  facial nerve trunk.  Frozen section reported findings consistent with  adenopathy or possible polymorphic  adenoma but no evidence of carcinoma.  A  separate frozen section was obtained from one of the lower nodules in the  neck and a similar diagnosis was obtained with a benign adenomatous lesion .  We subsequently dissected down the cartilage of the ear to find the trunk of  the facial nerve.  The trunk of the facial nerve was identified.  This was a  little bit on the small side and thin and dissection was carried out along  the trunk of the facial nerve out until it branched and the superficial lobe  of the parotid gland was dissected off the branches of the facial nerve.  Of  note, the marginal branch of the facial nerve was very thin and attenuated.  The superficial lobe of the parotid gland including the multiple nodules  were removed.  There were several nodules that appeared to be separate from  the parotid  gland that were likewise removed.  Dissection was then carried  down inferiorly in the submandibular region and in the region of the  superior jugular lymph nodes and these were dissected out and sent along  with the specimen of the parotid gland.  Hemostasis was obtained with  bipolar cautery and 3-0 silk ligatures.  Wound was irrigated with saline.  A  flat JP drain was brought through a separate stab incision.  After checking  for adequate hemostasis, wound was closed.  Of note, the facial nerve was  visually intact.  Wound was closed with 3-0 chromic sutures subcutaneously  and 5-0 nylon on the skin.  Patient was awoke from anesthesia and  transferred to the recovery room postoperatively doing well.  Of note, the  patient received 1 g Ancef IV preoperatively.   DISPOSITION:  Patient will be observed overnight in recovery in the hospital  and probable discharge home in the  morning.      CEN/MEDQ  D:  04/21/2004  T:  04/21/2004  Job:  [id number]   cc:   Hermelinda Medicus, M.D.  [address]  [place] [address]  Fax: [phone]   Wilson Singer, M.D.  [address]  [place]  [place] [address]  Fax: [phone]

## 2010-11-25 LAB — URINALYSIS, ROUTINE W REFLEX MICROSCOPIC
Bilirubin Urine: NEGATIVE
Glucose, UA: NEGATIVE
Hgb urine dipstick: NEGATIVE
Specific Gravity, Urine: 1.025
Urobilinogen, UA: 0.2
pH: 6

## 2010-11-25 LAB — CBC
MCHC: 33.7
RBC: 4.15
WBC: 6.8

## 2010-11-25 LAB — URINE MICROSCOPIC-ADD ON

## 2010-11-25 LAB — COMPREHENSIVE METABOLIC PANEL
ALT: 19
AST: 25
CO2: 27
Calcium: 9.5
GFR calc Af Amer: 58 — ABNORMAL LOW
GFR calc non Af Amer: 48 — ABNORMAL LOW
Potassium: 4.2
Sodium: 141
Total Protein: 7.2

## 2010-11-25 LAB — TYPE AND SCREEN
DAT, IgG: NEGATIVE
Donor AG Type: NEGATIVE

## 2010-11-25 LAB — DIFFERENTIAL
Eosinophils Absolute: 0.1
Eosinophils Relative: 1
Lymphs Abs: 2.4
Monocytes Relative: 8

## 2010-11-26 LAB — BASIC METABOLIC PANEL
Calcium: 8.8
Creatinine, Ser: 0.76
GFR calc Af Amer: >60
GFR calc non Af Amer: >60
Sodium: 139

## 2010-11-26 LAB — I-STAT 8, (EC8 V) (CONVERTED LAB)
Acid-Base Excess: 9 — ABNORMAL HIGH
BUN: 17
Bicarbonate: 34 — ABNORMAL HIGH
Bicarbonate: 34.1 — ABNORMAL HIGH
Chloride: 99
Glucose, Bld: 97
HCT: 26 — ABNORMAL LOW
HCT: 27 — ABNORMAL LOW
Operator id: 198171
TCO2: 36
pCO2, Ven: 51.8 — ABNORMAL HIGH
pCO2, Ven: 58.5 — ABNORMAL HIGH
pH, Ven: 7.373 — ABNORMAL HIGH
pH, Ven: 7.426 — ABNORMAL HIGH

## 2010-11-26 LAB — PROTIME-INR
INR: 1
INR: 1.3
INR: 1.7 — ABNORMAL HIGH
INR: 2.8 — ABNORMAL HIGH
Prothrombin Time: 13.5
Prothrombin Time: 19.7 — ABNORMAL HIGH
Prothrombin Time: 20.7 — ABNORMAL HIGH
Prothrombin Time: 30.8 — ABNORMAL HIGH

## 2010-11-26 LAB — CBC
HCT: 24.8 — ABNORMAL LOW
HCT: 26.4 — ABNORMAL LOW
Hemoglobin: 8.1 — ABNORMAL LOW
Hemoglobin: 8.9 — ABNORMAL LOW
Hemoglobin: 9.5 — ABNORMAL LOW
MCHC: 34.3
MCV: 89.2
Platelets: 161
Platelets: 161
RBC: 2.67 — ABNORMAL LOW
RBC: 2.78 — ABNORMAL LOW
RBC: 3.16 — ABNORMAL LOW
RDW: 14.5
WBC: 5.4
WBC: 6.1
WBC: 7.1
WBC: 8

## 2010-11-26 LAB — POCT I-STAT CREATININE
Creatinine, Ser: 1.5 — ABNORMAL HIGH
Operator id: 146091

## 2011-08-06 DIAGNOSIS — A020 Salmonella enteritis: Secondary | ICD-10-CM

## 2011-08-06 HISTORY — DX: Salmonella enteritis: A02.0

## 2011-08-30 ENCOUNTER — Emergency Department (HOSPITAL_COMMUNITY)
Admission: EM | Admit: 2011-08-30 | Discharge: 2011-08-31 | Disposition: A | Discharge status: Home / Self Care | Payer: Medicare Other | Attending: Emergency Medicine | Admitting: Emergency Medicine

## 2011-08-30 ENCOUNTER — Encounter (HOSPITAL_COMMUNITY): Payer: Self-pay | Admitting: *Deleted

## 2011-08-30 DIAGNOSIS — Z79899 Other long term (current) drug therapy: Secondary | ICD-10-CM | POA: Insufficient documentation

## 2011-08-30 DIAGNOSIS — E785 Hyperlipidemia, unspecified: Secondary | ICD-10-CM | POA: Insufficient documentation

## 2011-08-30 DIAGNOSIS — I10 Essential (primary) hypertension: Secondary | ICD-10-CM | POA: Insufficient documentation

## 2011-08-30 DIAGNOSIS — E860 Dehydration: Secondary | ICD-10-CM | POA: Insufficient documentation

## 2011-08-30 DIAGNOSIS — R197 Diarrhea, unspecified: Secondary | ICD-10-CM | POA: Insufficient documentation

## 2011-08-30 HISTORY — DX: Essential (primary) hypertension: I10

## 2011-08-30 HISTORY — DX: Hyperlipidemia, unspecified: E78.5

## 2011-08-30 LAB — URINALYSIS, ROUTINE W REFLEX MICROSCOPIC
Bilirubin Urine: NEGATIVE
Glucose, UA: NEGATIVE mg/dL
Hgb urine dipstick: NEGATIVE
Specific Gravity, Urine: 1.024 (ref 1.005–1.030)
pH: 5 (ref 5.0–8.0)

## 2011-08-30 LAB — CBC
Hemoglobin: 10.8 g/dL — ABNORMAL LOW (ref 12.0–15.0)
MCHC: 33.5 g/dL (ref 30.0–36.0)
RBC: 3.41 MIL/uL — ABNORMAL LOW (ref 3.87–5.11)
WBC: 10.4 10*3/uL (ref 4.0–10.5)

## 2011-08-30 LAB — COMPREHENSIVE METABOLIC PANEL
ALT: 13 U/L (ref 0–35)
AST: 18 U/L (ref 0–37)
Alkaline Phosphatase: 50 U/L (ref 39–117)
GFR calc Af Amer: 59 mL/min — ABNORMAL LOW (ref >90)
Glucose, Bld: 125 mg/dL — ABNORMAL HIGH (ref 70–99)
Potassium: 3.3 mEq/L — ABNORMAL LOW (ref 3.5–5.1)
Sodium: 135 mEq/L (ref 135–145)
Total Protein: 5.9 g/dL — ABNORMAL LOW (ref 6.0–8.3)

## 2011-08-30 LAB — DIFFERENTIAL
Basophils Relative: 0 % (ref 0–1)
Lymphocytes Relative: 4 % — ABNORMAL LOW (ref 12–46)
Monocytes Relative: 4 % (ref 3–12)
Neutro Abs: 9.5 10*3/uL — ABNORMAL HIGH (ref 1.7–7.7)
Neutrophils Relative %: 91 % — ABNORMAL HIGH (ref 43–77)

## 2011-08-30 LAB — URINE MICROSCOPIC-ADD ON

## 2011-08-30 MED ORDER — ONDANSETRON HCL 4 MG/2ML IJ SOLN
4.0000 mg | Freq: Once | INTRAMUSCULAR | Status: AC
  Administered 2011-08-30: 4 mg via INTRAVENOUS
  Filled 2011-08-30: qty 2

## 2011-08-30 MED ORDER — ONDANSETRON HCL 4 MG PO TABS: 4.0000 mg | ORAL_TABLET | Freq: Four times a day (QID) | ORAL | Status: AC

## 2011-08-30 MED ORDER — SODIUM CHLORIDE 0.9 % IV SOLN
Freq: Once | INTRAVENOUS | Status: AC
  Administered 2011-08-30: 21:00:00 via INTRAVENOUS
  Administered 2011-08-30: 500 mL via INTRAVENOUS

## 2011-08-30 MED ORDER — POTASSIUM CHLORIDE CRYS ER 20 MEQ PO TBCR
40.0000 meq | EXTENDED_RELEASE_TABLET | Freq: Once | ORAL | Status: AC
  Administered 2011-08-30: 40 meq via ORAL
  Filled 2011-08-30: qty 2

## 2011-08-30 MED ORDER — ACETAMINOPHEN 325 MG PO TABS
650.0000 mg | ORAL_TABLET | Freq: Once | ORAL | Status: AC
  Administered 2011-08-30: 650 mg via ORAL

## 2011-08-30 MED ORDER — DIPHENOXYLATE-ATROPINE 2.5-0.025 MG PO TABS: 1.0000 | ORAL_TABLET | Freq: Four times a day (QID) | ORAL | Status: AC | PRN

## 2011-08-30 MED ORDER — SODIUM CHLORIDE 0.9 % IV BOLUS (SEPSIS)
1000.0000 mL | Freq: Once | INTRAVENOUS | Status: AC
  Administered 2011-08-30: 1000 mL via INTRAVENOUS

## 2011-08-30 NOTE — ED Notes (Signed)
Per EMS: pt coming from home with c/o of n/v/d since this morning. Pt reports vomiting 4-5 times. Pt denies pain. VSS. Pt is a&o x4. Ambulatory to stretcher. Pt feels warm to the touch. No diaphoresis. Pt is in no apparent distress.

## 2011-08-30 NOTE — ED Provider Notes (Signed)
History     CSN: [id number]  Arrival date & time 08/30/11  1940   First MD Initiated Contact with Patient 08/30/11 2012      Chief Complaint  Patient presents with  . Nausea  . Emesis  . Diarrhea    (Consider location/radiation/quality/duration/timing/severity/associated sxs/prior treatment) Patient is a 78 y.o. female presenting with vomiting and diarrhea. The history is provided by the patient.  Emesis  Associated symptoms include diarrhea.  Diarrhea The primary symptoms include vomiting and diarrhea.   patient here with one-day history of nonbilious vomiting and loose stools. No recent fevers, abdominal pain, antibiotic use, travel. No known sick exposures. No meds taken prior to arrival. Nothing makes her symptoms better worse. No cough or dyspnea. Denies chest pain or rash  Past Medical History  Diagnosis Date  . Hypertension   . Hyperlipidemia     History reviewed. No pertinent past surgical history.  No family history on file.  History  Substance Use Topics  . Smoking status: Not on file  . Smokeless tobacco: Not on file  . Alcohol Use:     OB History    Grav Para Term Preterm Abortions TAB SAB Ect Mult Living                  Review of Systems  Gastrointestinal: Positive for vomiting and diarrhea.  All other systems reviewed and are negative.    Allergies  Demerol and Penicillins  Home Medications   Current Outpatient Rx  Name Route Sig Dispense Refill  . ALLOPURINOL 100 MG PO TABS Oral Take 100 mg by mouth 2 (two) times daily.    . ATENOLOL 50 MG PO TABS Oral Take 50 mg by mouth daily.    . ATORVASTATIN CALCIUM 40 MG PO TABS Oral Take 40 mg by mouth daily.    . COLESEVELAM HCL 625 MG PO TABS Oral Take 1,875 mg by mouth 2 (two) times daily with a meal.    . FUROSEMIDE 40 MG PO TABS Oral Take 40 mg by mouth daily.    . ADULT MULTIVITAMIN W/MINERALS CH Oral Take 1 tablet by mouth daily.    Marland Kitchen OLMESARTAN MEDOXOMIL 20 MG PO TABS Oral Take 20 mg by  mouth daily.    . OMEGA-3-ACID ETHYL ESTERS 1 G PO CAPS Oral Take 1 g by mouth 2 (two) times daily.    Marland Kitchen RALOXIFENE HCL 60 MG PO TABS Oral Take 60 mg by mouth daily.      BP 116/54  Pulse 89  Temp 101.6 F (38.7 C) (Rectal)  Resp 18  SpO2 94%  Physical Exam  Nursing note and vitals reviewed. Constitutional: She is oriented to person, place, and time. She appears well-developed and well-nourished.  Non-toxic appearance. No distress.  HENT:  Head: Normocephalic and atraumatic.  Eyes: Conjunctivae, EOM and lids are normal. Pupils are equal, round, and reactive to light.  Neck: Normal range of motion. Neck supple. No tracheal deviation present. No mass present.  Cardiovascular: Normal rate, regular rhythm and normal heart sounds.  Exam reveals no gallop.   No murmur heard. Pulmonary/Chest: Effort normal and breath sounds normal. No stridor. No respiratory distress. She has no decreased breath sounds. She has no wheezes. She has no rhonchi. She has no rales.  Abdominal: Soft. Normal appearance and bowel sounds are normal. She exhibits no distension. There is no tenderness. There is no rebound and no CVA tenderness.  Musculoskeletal: Normal range of motion. She exhibits no edema and no  tenderness.  Neurological: She is alert and oriented to person, place, and time. She has normal strength. No cranial nerve deficit or sensory deficit. GCS eye subscore is 4. GCS verbal subscore is 5. GCS motor subscore is 6.  Skin: Skin is warm and dry. No abrasion and no rash noted.  Psychiatric: She has a normal mood and affect. Her speech is normal and behavior is normal.    ED Course  Procedures (including critical care time)   Labs Reviewed  CBC  DIFFERENTIAL  COMPREHENSIVE METABOLIC PANEL  LIPASE, BLOOD  URINALYSIS, ROUTINE W REFLEX MICROSCOPIC  URINE CULTURE   No results found.   No diagnosis found.    MDM  Pt given iv fluids and feels better --suspect gastro, repeat abd exam benign,  pt stable for d/c        Toy Baker, MD 08/30/11 2319

## 2011-08-30 NOTE — Discharge Instructions (Signed)
Dehydration, Elderly  Dehydration is when you lose more fluids from the body than you take in. Vital organs such as the kidneys, brain, and heart cannot function without a proper amount of fluids and salt. Any loss of fluids from the body can cause dehydration.   Older adults are at a higher risk of dehydration than younger adults. As we age, our bodies are less able to conserve water and do not respond to temperature changes as well. Also, older adults do not become thirsty as easily or quickly. Because of this, older adults often do not realize they need to increase fluids to avoid dehydration.   CAUSES    Vomiting.   Diarrhea.   Excessive sweating.   Excessive urine output.   Fever.  SYMPTOMS   Mild dehydration   Thirst.   Dry lips.   Slightly dry mouth.  Moderate dehydration   Very dry mouth.   Sunken eyes.   Skin does not bounce back quickly when lightly pinched and released.   Dark urine and decreased urine production.   Decreased tear production.   Headache.  Severe dehydration   Very dry mouth.   Extreme thirst.   Rapid, weak pulse (more than 100 beats per minute at rest).   Cold hands and feet.   Not able to sweat in spite of heat.   Rapid breathing.   Blue lips.   Confusion and lethargy.   Difficulty being awakened.   Minimal urine production.   No tears.  DIAGNOSIS   Your caregiver will diagnose dehydration based on your symptoms and your exam. Blood and urine tests will help confirm the diagnosis. The diagnostic evaluation should also identify the cause of dehydration.  TREATMENT   Treatment of mild or moderate dehydration can often be done at home by increasing the amount of fluids that you drink. It is best to drink small amounts of fluid more often. Drinking too much at one time can make vomiting worse.   Severe dehydration needs to be treated at the hospital where you will probably be given intravenous (IV) fluids that contain water and electrolytes.  HOME CARE INSTRUCTIONS     Ask your caregiver about specific rehydration instructions.   Drink enough fluids to keep your urine clear or pale yellow.   Drink small amounts frequently if you have nausea and vomiting.   Eat as you normally do.   Avoid:   Foods or drinks high in sugar.   Carbonated drinks.   Juice.   Extremely hot or cold fluids.   Drinks with caffeine.   Fatty, greasy foods.   Alcohol.   Tobacco.   Overeating.   Gelatin desserts.   Wash your hands well to avoid spreading bacteria and viruses.   Only take over-the-counter or prescription medicines for pain, discomfort, or fever as directed by your caregiver.   Ask your caregiver if you should continue all prescribed and over-the-counter medicines.   Keep all follow-up appointments with your caregiver.  SEEK MEDICAL CARE IF:   You have abdominal pain and it increases or stays in one area (localizes).   You have a rash, stiff neck, or severe headache.   You are irritable, sleepy, or difficult to awaken.   You are weak, dizzy, or extremely thirsty.  SEEK IMMEDIATE MEDICAL CARE IF:    You are unable to keep fluids down, or you get worse despite treatment.   You have frequent episodes of vomiting or diarrhea.   You have blood or green   looks black and tarry.   You have not urinated in 6 to 8 hours, or you have only urinated a small amount of very dark urine.   You have a fever.   You faint.  MAKE SURE YOU:   Understand these instructions.   Will watch your condition.   Will get help right away if you are not doing well or get worse.  Document Released: 05/14/2003 Document Revised: 02/10/2011 Document Reviewed: 10/11/2010 [facility] Patient Information 2012 [place], [place]. Viral Gastroenteritis Viral gastroenteritis is also known as stomach flu. This condition affects the stomach and intestinal tract. It  can cause sudden diarrhea and vomiting. The illness typically lasts 3 to 8 days. Most people develop an immune response that eventually gets rid of the virus. While this natural response develops, the virus can make you quite ill. CAUSES  Many different viruses can cause gastroenteritis, such as rotavirus or noroviruses. You can catch one of these viruses by consuming contaminated food or water. You may also catch a virus by sharing utensils or other personal items with an infected person or by touching a contaminated surface. SYMPTOMS  The most common symptoms are diarrhea and vomiting. These problems can cause a severe loss of body fluids (dehydration) and a body salt (electrolyte) imbalance. Other symptoms may include:  Fever.   Headache.   Fatigue.   Abdominal pain.  DIAGNOSIS  Your caregiver can usually diagnose viral gastroenteritis based on your symptoms and a physical exam. A stool sample may also be taken to test for the presence of viruses or other infections. TREATMENT  This illness typically goes away on its own. Treatments are aimed at rehydration. The most serious cases of viral gastroenteritis involve vomiting so severely that you are not able to keep fluids down. In these cases, fluids must be given through an intravenous line (IV). HOME CARE INSTRUCTIONS   Drink enough fluids to keep your urine clear or pale yellow. Drink small amounts of fluids frequently and increase the amounts as tolerated.   Ask your caregiver for specific rehydration instructions.   Avoid:   Foods high in sugar.   Alcohol.   Carbonated drinks.   Tobacco.   Juice.   Caffeine drinks.   Extremely hot or cold fluids.   Fatty, greasy foods.   Too much intake of anything at one time.   Dairy products until 24 to 48 hours after diarrhea stops.   You may consume probiotics. Probiotics are active cultures of beneficial bacteria. They may lessen the amount and number of diarrheal stools in  adults. Probiotics can be found in yogurt with active cultures and in supplements.   Wash your hands well to avoid spreading the virus.   Only take over-the-counter or prescription medicines for pain, discomfort, or fever as directed by your caregiver. Do not give aspirin to children. Antidiarrheal medicines are not recommended.   Ask your caregiver if you should continue to take your regular prescribed and over-the-counter medicines.   Keep all follow-up appointments as directed by your caregiver.  SEEK IMMEDIATE MEDICAL CARE IF:   You are unable to keep fluids down.   You do not urinate at least once every 6 to 8 hours.   You develop shortness of breath.   You notice blood in your stool or vomit. This may look like coffee grounds.   You have abdominal pain that increases or is concentrated in one small area (localized).   You have persistent vomiting or diarrhea.   You have  a fever.   The patient is a child younger than 3 months, and he or she has a fever.   The patient is a child older than 3 months, and he or she has a fever and persistent symptoms.   The patient is a child older than 3 months, and he or she has a fever and symptoms suddenly get worse.   The patient is a baby, and he or she has no tears when crying.  MAKE SURE YOU:   Understand these instructions.   Will watch your condition.   Will get help right away if you are not doing well or get worse.  Document Released: 02/21/2005 Document Revised: 02/10/2011 Document Reviewed: 12/08/2010 [facility] [address] Patient Information 2012 [place], [place].

## 2011-08-30 NOTE — ED Notes (Signed)
Bed:WHALA<BR> Expected date:<BR> Expected time: 7:44 PM<BR> Means of arrival:<BR> Comments:<BR> M261 - 87yoF Flu s/s, *wheelchair-capable*

## 2011-08-30 NOTE — ED Notes (Signed)
ZOX:[id number]<AV> Expected date:08/30/11<BR> Expected time: 8:06 PM<BR> Means of arrival:<BR> Comments:<BR> From hallway A to 23

## 2011-08-31 ENCOUNTER — Inpatient Hospital Stay (HOSPITAL_COMMUNITY)
Admission: EM | Admit: 2011-08-31 | Discharge: 2011-09-15 | DRG: 871 | Disposition: A | Discharge status: Skilled Nursing Facility | Payer: Medicare Other | Attending: Internal Medicine | Admitting: Internal Medicine

## 2011-08-31 ENCOUNTER — Encounter (HOSPITAL_COMMUNITY): Payer: Self-pay | Admitting: Emergency Medicine

## 2011-08-31 ENCOUNTER — Emergency Department (HOSPITAL_COMMUNITY): Payer: Medicare Other

## 2011-08-31 DIAGNOSIS — E860 Dehydration: Secondary | ICD-10-CM

## 2011-08-31 DIAGNOSIS — J90 Pleural effusion, not elsewhere classified: Secondary | ICD-10-CM | POA: Diagnosis present

## 2011-08-31 DIAGNOSIS — R6521 Severe sepsis with septic shock: Secondary | ICD-10-CM | POA: Diagnosis not present

## 2011-08-31 DIAGNOSIS — K529 Noninfective gastroenteritis and colitis, unspecified: Secondary | ICD-10-CM | POA: Diagnosis present

## 2011-08-31 DIAGNOSIS — I959 Hypotension, unspecified: Secondary | ICD-10-CM

## 2011-08-31 DIAGNOSIS — J4489 Other specified chronic obstructive pulmonary disease: Secondary | ICD-10-CM | POA: Diagnosis present

## 2011-08-31 DIAGNOSIS — N179 Acute kidney failure, unspecified: Secondary | ICD-10-CM | POA: Diagnosis present

## 2011-08-31 DIAGNOSIS — E782 Mixed hyperlipidemia: Secondary | ICD-10-CM

## 2011-08-31 DIAGNOSIS — J189 Pneumonia, unspecified organism: Secondary | ICD-10-CM | POA: Diagnosis not present

## 2011-08-31 DIAGNOSIS — J449 Chronic obstructive pulmonary disease, unspecified: Secondary | ICD-10-CM | POA: Diagnosis present

## 2011-08-31 DIAGNOSIS — A0472 Enterocolitis due to Clostridium difficile, not specified as recurrent: Secondary | ICD-10-CM | POA: Diagnosis present

## 2011-08-31 DIAGNOSIS — N390 Urinary tract infection, site not specified: Secondary | ICD-10-CM | POA: Diagnosis present

## 2011-08-31 DIAGNOSIS — R0902 Hypoxemia: Secondary | ICD-10-CM | POA: Diagnosis present

## 2011-08-31 DIAGNOSIS — R1310 Dysphagia, unspecified: Secondary | ICD-10-CM | POA: Diagnosis present

## 2011-08-31 DIAGNOSIS — A020 Salmonella enteritis: Secondary | ICD-10-CM | POA: Diagnosis present

## 2011-08-31 DIAGNOSIS — I10 Essential (primary) hypertension: Secondary | ICD-10-CM | POA: Diagnosis present

## 2011-08-31 DIAGNOSIS — R6520 Severe sepsis without septic shock: Secondary | ICD-10-CM | POA: Diagnosis not present

## 2011-08-31 DIAGNOSIS — E785 Hyperlipidemia, unspecified: Secondary | ICD-10-CM | POA: Diagnosis present

## 2011-08-31 DIAGNOSIS — I4891 Unspecified atrial fibrillation: Secondary | ICD-10-CM | POA: Diagnosis present

## 2011-08-31 DIAGNOSIS — A419 Sepsis, unspecified organism: Principal | ICD-10-CM | POA: Diagnosis present

## 2011-08-31 DIAGNOSIS — R197 Diarrhea, unspecified: Secondary | ICD-10-CM

## 2011-08-31 DIAGNOSIS — R5383 Other fatigue: Secondary | ICD-10-CM

## 2011-08-31 DIAGNOSIS — R509 Fever, unspecified: Secondary | ICD-10-CM

## 2011-08-31 HISTORY — DX: Personal history of other diseases of the digestive system: Z87.19

## 2011-08-31 HISTORY — DX: Chronic obstructive pulmonary disease, unspecified: J44.9

## 2011-08-31 HISTORY — DX: Dorsalgia, unspecified: M54.9

## 2011-08-31 HISTORY — DX: Age-related osteoporosis without current pathological fracture: M81.0

## 2011-08-31 HISTORY — DX: Other chronic pain: G89.29

## 2011-08-31 HISTORY — DX: Unspecified osteoarthritis, unspecified site: M19.90

## 2011-08-31 LAB — URINALYSIS, ROUTINE W REFLEX MICROSCOPIC
Nitrite: POSITIVE — AB
Specific Gravity, Urine: 1.024 (ref 1.005–1.030)
Urobilinogen, UA: 0.2 mg/dL (ref 0.0–1.0)

## 2011-08-31 LAB — CBC WITH DIFFERENTIAL/PLATELET
Basophils Absolute: 0 10*3/uL (ref 0.0–0.1)
Basophils Relative: 1 % (ref 0–1)
HCT: 37.1 % (ref 36.0–46.0)
Hemoglobin: 12.1 g/dL (ref 12.0–15.0)
Lymphocytes Relative: 14 % (ref 12–46)
Monocytes Relative: 4 % (ref 3–12)
Neutro Abs: 3.3 10*3/uL (ref 1.7–7.7)
Neutrophils Relative %: 81 % — ABNORMAL HIGH (ref 43–77)
WBC: 4.1 10*3/uL (ref 4.0–10.5)

## 2011-08-31 LAB — TROPONIN I: Troponin I: <0.3 ng/mL (ref <0.30)

## 2011-08-31 LAB — URINE MICROSCOPIC-ADD ON

## 2011-08-31 LAB — COMPREHENSIVE METABOLIC PANEL
AST: 38 U/L — ABNORMAL HIGH (ref 0–37)
Calcium: 9.2 mg/dL (ref 8.4–10.5)
Chloride: 102 mEq/L (ref 96–112)
Creatinine, Ser: 1.67 mg/dL — ABNORMAL HIGH (ref 0.50–1.10)
GFR calc Af Amer: 31 mL/min — ABNORMAL LOW (ref >90)
GFR calc non Af Amer: 27 mL/min — ABNORMAL LOW (ref >90)
Glucose, Bld: 108 mg/dL — ABNORMAL HIGH (ref 70–99)
Potassium: 3.9 mEq/L (ref 3.5–5.1)
Sodium: 133 mEq/L — ABNORMAL LOW (ref 135–145)
Total Protein: 6.6 g/dL (ref 6.0–8.3)

## 2011-08-31 MED ORDER — SODIUM CHLORIDE 0.9 % IV BOLUS (SEPSIS): 1000.0000 mL | Freq: Once | INTRAVENOUS | Status: DC

## 2011-08-31 MED ORDER — ACETAMINOPHEN 650 MG RE SUPP
650.0000 mg | Freq: Once | RECTAL | Status: AC
Start: 2011-08-31 — End: 2011-08-31
  Administered 2011-08-31: 650 mg via RECTAL
  Filled 2011-08-31: qty 1

## 2011-08-31 MED ORDER — SODIUM CHLORIDE 0.9 % IV SOLN
INTRAVENOUS | Status: DC
  Administered 2011-08-31: 22:00:00 via INTRAVENOUS

## 2011-08-31 MED ORDER — SODIUM CHLORIDE 0.9 % IV BOLUS (SEPSIS)
500.0000 mL | Freq: Once | INTRAVENOUS | Status: AC
  Administered 2011-08-31: 500 mL via INTRAVENOUS

## 2011-08-31 MED ORDER — ONDANSETRON HCL 4 MG/2ML IJ SOLN
INTRAMUSCULAR | Status: AC
  Administered 2011-08-31: 4 mg via INTRAVENOUS
  Filled 2011-08-31: qty 2

## 2011-08-31 MED ORDER — ONDANSETRON HCL 4 MG/2ML IJ SOLN
4.0000 mg | Freq: Once | INTRAMUSCULAR | Status: AC
  Administered 2011-08-31: 4 mg via INTRAVENOUS

## 2011-08-31 MED ORDER — IOHEXOL 300 MG/ML  SOLN
100.0000 mL | Freq: Once | INTRAMUSCULAR | Status: AC | PRN
  Administered 2011-08-31: 100 mL via INTRAVENOUS

## 2011-08-31 MED ORDER — SODIUM CHLORIDE 0.9 % IV BOLUS (SEPSIS)
500.0000 mL | Freq: Once | INTRAVENOUS | Status: AC
  Administered 2011-08-31: 23:00:00 via INTRAVENOUS

## 2011-08-31 NOTE — ED Provider Notes (Addendum)
History     CSN: [id number]  Arrival date & time 08/31/11  2100   First MD Initiated Contact with Patient 08/31/11 2125      Chief Complaint  Patient presents with  . Nausea  . Emesis  . Diarrhea    HPI Pt was seen at 2145.  Per pt and family, c/o gradual onset and persistence of multiple intermittent episodes of N/V/D for the past 2 days.  Pt was eval in the ED yesterday for same, rx zofran and lomotil without relief.  States they called her PMD today, who rx imodium and phenergan.  Pt's family states pt became lethargic and "weaker" after taking phenergan today.  Has been unable to ambulate due to generalized weakness/lethargy. Pt describes her stools as "watery."  Denies black or blood in stools or emesis. Denies focal motor weakness, no tingling/numbness in extremities, no facial droop, no falls.  Denies home fevers, no abd pain, no rash, no back pain, no CP/SOB, no recent abx use, no sick contacts, no recent travel.      Past Medical History  Diagnosis Date  . Hypertension   . Hyperlipidemia   . COPD (chronic obstructive pulmonary disease)   . Osteoporosis   . Peptic ulcer disease     Past Surgical History  Procedure Date  . Carotid endarterectomy     bilateral    History  Substance Use Topics  . Smoking status: Former Games developer  . Smokeless tobacco: Not on file  . Alcohol Use: No    Review of Systems ROS: Statement: All systems negative except as marked or noted in the HPI; Constitutional: Negative for fever and chills. ; ; Eyes: Negative for eye pain, redness and discharge. ; ; ENMT: Negative for ear pain, hoarseness, nasal congestion, sinus pressure and sore throat. ; ; Cardiovascular: Negative for chest pain, palpitations, diaphoresis, dyspnea and peripheral edema. ; ; Respiratory: Negative for cough, wheezing and stridor. ; ; Gastrointestinal: +N/V/D. Negative for abdominal pain, blood in stool, hematemesis, jaundice and rectal bleeding. . ; ; Genitourinary: Negative  for dysuria, flank pain and hematuria. ; ; Musculoskeletal: Negative for back pain and neck pain. Negative for swelling and trauma.; ; Skin: Negative for pruritus, rash, abrasions, blisters, bruising and skin lesion.; ; Neuro: +generalized weakness/fatigue, lethargy.  Negative for headache, lightheadedness and neck stiffness. Negative for altered level of consciousness , altered mental status, extremity weakness, paresthesias, involuntary movement, seizure and syncope.     Allergies  Demerol and Penicillins  Home Medications   Current Outpatient Rx  Name Route Sig Dispense Refill  . ALLOPURINOL 100 MG PO TABS Oral Take 100 mg by mouth 2 (two) times daily.    . ATENOLOL 50 MG PO TABS Oral Take 50 mg by mouth daily.    . ATORVASTATIN CALCIUM 40 MG PO TABS Oral Take 40 mg by mouth daily.    . COLESEVELAM HCL 625 MG PO TABS Oral Take 1,875 mg by mouth 2 (two) times daily with a meal.    . DIPHENOXYLATE-ATROPINE 2.5-0.025 MG PO TABS Oral Take 1 tablet by mouth 4 (four) times daily as needed for diarrhea or loose stools. 30 tablet 0  . FUROSEMIDE 40 MG PO TABS Oral Take 40 mg by mouth daily.    . ADULT MULTIVITAMIN W/MINERALS CH Oral Take 1 tablet by mouth daily.    Marland Kitchen OLMESARTAN MEDOXOMIL 20 MG PO TABS Oral Take 20 mg by mouth daily.    . OMEGA-3-ACID ETHYL ESTERS 1 G PO CAPS Oral  Take 1 g by mouth 2 (two) times daily.    Marland Kitchen ONDANSETRON HCL 4 MG PO TABS Oral Take 1 tablet (4 mg total) by mouth every 6 (six) hours. 12 tablet 0  . RALOXIFENE HCL 60 MG PO TABS Oral Take 60 mg by mouth daily.      BP 80/60  Pulse 85  Temp 102.9 F (39.4 C) (Rectal)  Resp 24  SpO2 97%  Physical Exam 2145: Physical examination:  Nursing notes reviewed; Vital signs and O2 SAT reviewed;  Constitutional: Well developed, Well nourished, In no acute distress; Head:  Normocephalic, atraumatic; Eyes: EOMI, PERRL, No scleral icterus; ENMT: Mouth and pharynx normal, Mucous membranes dry; Neck: Supple, Full range of motion,  No lymphadenopathy; Cardiovascular: Regular rate and rhythm, No gallop; Respiratory: Breath sounds clear & equal bilaterally, No wheezes.  Speaking full sentences with ease, Normal respiratory effort/excursion; Chest: Nontender, Movement normal; Abdomen: Nontender, +softly distended and tympanitic to percussion, hyperactive bowel sounds;; Extremities: Pulses normal, No tenderness, No edema, No calf edema or asymmetry.; Neuro: Lethargic, but awakens easily to name, opens eyes and speaks with ED staff and family at bedside. Major CN grossly intact.  Speech slightly slurred.  No facial droop.  Moves all ext on stretcher without apparent gross focal motor deficits.; Skin: Color normal, Warm, Dry, no rash.    ED Course  Procedures   2150:  Pt lethargic, but arousable to A&O, resps easy.  Symptoms of lethargy and generalized weakness began shortly after taking phenergan; likely the cause.  No hx of falls per pt or family.  No focal neuro deficits.  Mildly acidotic on yesterday's labs, possibly due to N/V/D, but now having fever and distended abd.  Need to consider mesenteric ischemia, acute diverticulitis with possible perforation/abscess.  Also, since pt has no hx of previous abd surgeries, need to consider acute chole or appy.  EKG, CXR, UA/UC, BC x2, labs ordered.  IVF to be given for hypotension.  Will check CT A/P after today's Cr result comes back (+/- contrast/dose) to r/o acute abd process as cause for change in symptoms today.   2300:  Pt remains lethargic, but opens her eyes to her name and speaks with ED staff and family at bedside.  SBP improves with IVF, will continue.  APAP given for fever.  No N/V or stooling while in the ED.  Clinically appears dehydrated.  WBC and lactic acid normal.  No catastrophic findings on CT A/P (aorta normal, no FF or free air in abd, GB and appendix normal).  Possible UTI on Udip, UC pending.  BCx2 are pending.  Dx testing d/w pt and family.  Questions answered.  Verb  understanding, agreeable to admit.  T/C to [facility]. Conley Rolls, case discussed, including:  HPI, pertinent PM/SHx, VS/PE, dx testing, ED course and treatment:  Agreeable to admit, requests to obtain stepdown bed to team 1, also requests to hold abx at this time until he sees the pt.    MDM  MDM Reviewed: nursing note, vitals and previous chart Reviewed previous: labs and ECG Interpretation: labs, ECG, x-ray and CT scan Total time providing critical care: 30-74 minutes. This excludes time spent performing separately reportable procedures and services. Consults: admitting MD   CRITICAL CARE Performed by: Laray Anger Total critical care time: 35 Critical care time was exclusive of separately billable procedures and treating other patients. Critical care was necessary to treat or prevent imminent or life-threatening deterioration. Critical care was time spent personally by me on  the following activities: development of treatment plan with patient and/or surrogate as well as nursing, discussions with consultants, evaluation of patient's response to treatment, examination of patient, obtaining history from patient or surrogate, ordering and performing treatments and interventions, ordering and review of laboratory studies, ordering and review of radiographic studies, pulse oximetry and re-evaluation of patient's condition.      Date: 08/31/2011  Rate: 84  Rhythm: normal sinus rhythm and premature atrial contractions (PAC)  QRS Axis: normal  Intervals: normal  ST/T Wave abnormalities: normal  Conduction Disutrbances:none  Narrative Interpretation:   Old EKG Reviewed: unchanged; no significant changes from previous EKG dated 02/04/2009.   Results for orders placed during the hospital encounter of 08/31/11  CBC WITH DIFFERENTIAL      Component Value Range   WBC 4.1  4.0 - 10.5 K/uL   RBC 3.91  3.87 - 5.11 MIL/uL   Hemoglobin 12.1  12.0 - 15.0 g/dL   HCT 16.1  09.6 - 04.5 %   MCV 94.9   78.0 - 100.0 fL   MCH 30.9  26.0 - 34.0 pg   MCHC 32.6  30.0 - 36.0 g/dL   RDW 40.9  81.1 - 91.4 %   Platelets 132 (*) 150 - 400 K/uL   Neutrophils Relative 81 (*) 43 - 77 %   Lymphocytes Relative 14  12 - 46 %   Monocytes Relative 4  3 - 12 %   Eosinophils Relative 0  0 - 5 %   Basophils Relative 1  0 - 1 %   Neutro Abs 3.3  1.7 - 7.7 K/uL   Lymphs Abs 0.6 (*) 0.7 - 4.0 K/uL   Monocytes Absolute 0.2  0.1 - 1.0 K/uL   Eosinophils Absolute 0.0  0.0 - 0.7 K/uL   Basophils Absolute 0.0  0.0 - 0.1 K/uL   WBC Morphology INCREASED BANDS (>20% BANDS)    COMPREHENSIVE METABOLIC PANEL      Component Value Range   Sodium 133 (*) 135 - 145 mEq/L   Potassium 3.9  3.5 - 5.1 mEq/L   Chloride 102  96 - 112 mEq/L   CO2 16 (*) 19 - 32 mEq/L   Glucose, Bld 108 (*) 70 - 99 mg/dL   BUN 24 (*) 6 - 23 mg/dL   Creatinine, Ser 7.82 (*) 0.50 - 1.10 mg/dL   Calcium 9.2  8.4 - 95.6 mg/dL   Total Protein 6.6  6.0 - 8.3 g/dL   Albumin 3.1 (*) 3.5 - 5.2 g/dL   AST 38 (*) 0 - 37 U/L   ALT 16  0 - 35 U/L   Alkaline Phosphatase 52  39 - 117 U/L   Total Bilirubin 1.0  0.3 - 1.2 mg/dL   GFR calc non Af Amer 27 (*) >90 mL/min   GFR calc Af Amer 31 (*) >90 mL/min  URINALYSIS, ROUTINE W REFLEX MICROSCOPIC      Component Value Range   Color, Urine AMBER (*) YELLOW   APPearance CLOUDY (*) CLEAR   Specific Gravity, Urine 1.024  1.005 - 1.030   pH 5.5  5.0 - 8.0   Glucose, UA NEGATIVE  NEGATIVE mg/dL   Hgb urine dipstick LARGE (*) NEGATIVE   Bilirubin Urine SMALL (*) NEGATIVE   Ketones, ur TRACE (*) NEGATIVE mg/dL   Protein, ur 213 (*) NEGATIVE mg/dL   Urobilinogen, UA 0.2  0.0 - 1.0 mg/dL   Nitrite POSITIVE (*) NEGATIVE   Leukocytes, UA SMALL (*) NEGATIVE  TROPONIN I  Component Value Range   Troponin I <0.30  <0.30 ng/mL  LACTIC ACID, PLASMA      Component Value Range   Lactic Acid, Venous 1.8  0.5 - 2.2 mmol/L  LIPASE, BLOOD      Component Value Range   Lipase 15  11 - 59 U/L  URINE  MICROSCOPIC-ADD ON      Component Value Range   Squamous Epithelial / LPF FEW (*) RARE   WBC, UA 3-6  <3 WBC/hpf   RBC / HPF 0-2  <3 RBC/hpf   Bacteria, UA MANY (*) RARE   Ct Abdomen Pelvis W Contrast 08/31/2011  *RADIOLOGY REPORT*  Clinical Data: Lethargy, nausea, vomiting, diarrhea, and hypotension.  CT ABDOMEN AND PELVIS WITH CONTRAST  Technique:  Multidetector CT imaging of the abdomen and pelvis was performed following the standard protocol during bolus administration of intravenous contrast.  Contrast: OMNIPAQUE IOHEXOL 300 MG/ML  SOLN  Comparison: None.  Findings: Mild dependent atelectasis in the lung bases.  Probable cardiac enlargement.  The liver, spleen, gallbladder, pancreas, adrenal glands, kidneys, and retroperitoneal lymph nodes are unremarkable.  Diffuse calcification and tortuosity of the aorta without aneurysm.  Flow is demonstrated in the mesenteric and portal vessels.  There is a small amount of fluid around the posterior liver edge on the right. The stomach and small bowel are decompressed.  Prominent visceral adipose tissues.  No free fluid or free air in the abdomen.  The colon is not distended but is fluid-filled consistent with liquid stool.  There is suggestion of mild diffuse colonic wall thickening suggesting edema or colitis.  Infectious or inflammatory colitis could have this appearance.  Pelvis:  The uterus is anteverted.  Uterus and adnexal structures are not enlarged.  Minimal free fluid in the pelvis.  Foley catheter decompresses the bladder.  No loculated pelvic fluid collections.  Diverticula in the sigmoid colon without diverticulitis.  The appendix is normal.  Extensive degenerative changes throughout the lumbar spine with mild lumbar scoliosis. Mild anterior subluxation of L4 on L5.  IMPRESSION: Mild diffuse wall thickening and liquid stool in the colon suggesting infectious or inflammatory process.  Minimal fluid around the right liver edge and in the pelvis.  No  evidence of focal mass or abscess.  Original Report Authenticated By: Marlon Pel, M.D.   Dg Chest Port 1 View 08/31/2011  *RADIOLOGY REPORT*  Clinical Data: Cough.  Shortness of breath and weakness.  PORTABLE CHEST - 1 VIEW  Comparison: 02/04/2009  Findings: Shallow inspiration.  Cardiac enlargement with normal pulmonary vascularity.  No focal airspace consolidation or edema. No blunting of costophrenic angles.  No pneumothorax.  Surgical clips in the base of the neck.  Calcification of the aorta.  No significant change since previous study, allowing for technical differences.  IMPRESSION: Cardiac enlargement without significant vascular congestion or edema.  Original Report Authenticated By: Marlon Pel, M.D.       No midlevel was involved in the care of this patient. Neeko Pharo Allison Quarry, DO 09/02/11 1151

## 2011-08-31 NOTE — ED Notes (Signed)
Pt was seen here yesterday for same symptoms. Pt was given zofran and lomotil yesterday when discharged. Meds were not working so PCP prescribed phenergan over the phone and immodium. Family was concerned after pt took phenergan bc was weak and lethargic and still having nausea. CBG 105.

## 2011-08-31 NOTE — ED Notes (Signed)
ZOX:[id number]<AV> Expected date:08/31/11<BR> Expected time: 8:41 PM<BR> Means of arrival:Ambulance<BR> Comments:<BR> N/v/d

## 2011-09-01 ENCOUNTER — Encounter (HOSPITAL_COMMUNITY): Payer: Self-pay | Admitting: Internal Medicine

## 2011-09-01 DIAGNOSIS — I959 Hypotension, unspecified: Secondary | ICD-10-CM | POA: Insufficient documentation

## 2011-09-01 DIAGNOSIS — E785 Hyperlipidemia, unspecified: Secondary | ICD-10-CM | POA: Diagnosis present

## 2011-09-01 DIAGNOSIS — E782 Mixed hyperlipidemia: Secondary | ICD-10-CM

## 2011-09-01 DIAGNOSIS — R112 Nausea with vomiting, unspecified: Secondary | ICD-10-CM

## 2011-09-01 DIAGNOSIS — E860 Dehydration: Secondary | ICD-10-CM

## 2011-09-01 DIAGNOSIS — N179 Acute kidney failure, unspecified: Secondary | ICD-10-CM

## 2011-09-01 DIAGNOSIS — K529 Noninfective gastroenteritis and colitis, unspecified: Secondary | ICD-10-CM

## 2011-09-01 LAB — BASIC METABOLIC PANEL
Calcium: 8 mg/dL — ABNORMAL LOW (ref 8.4–10.5)
GFR calc Af Amer: 30 mL/min — ABNORMAL LOW (ref >90)
GFR calc non Af Amer: 26 mL/min — ABNORMAL LOW (ref >90)
Glucose, Bld: 111 mg/dL — ABNORMAL HIGH (ref 70–99)
Potassium: 4.1 mEq/L (ref 3.5–5.1)
Sodium: 134 mEq/L — ABNORMAL LOW (ref 135–145)

## 2011-09-01 LAB — CBC
Hemoglobin: 10.9 g/dL — ABNORMAL LOW (ref 12.0–15.0)
MCH: 31.5 pg (ref 26.0–34.0)
MCHC: 32.6 g/dL (ref 30.0–36.0)
Platelets: 136 10*3/uL — ABNORMAL LOW (ref 150–400)
RBC: 3.46 MIL/uL — ABNORMAL LOW (ref 3.87–5.11)

## 2011-09-01 LAB — LACTIC ACID, PLASMA: Lactic Acid, Venous: 1.3 mmol/L (ref 0.5–2.2)

## 2011-09-01 LAB — PROCALCITONIN: Procalcitonin: 5.54 ng/mL

## 2011-09-01 MED ORDER — ONDANSETRON HCL 4 MG PO TABS: 4.0000 mg | ORAL_TABLET | Freq: Four times a day (QID) | ORAL | Status: DC | PRN

## 2011-09-01 MED ORDER — CIPROFLOXACIN IN D5W 400 MG/200ML IV SOLN
400.0000 mg | Freq: Two times a day (BID) | INTRAVENOUS | Status: DC
  Administered 2011-09-01 – 2011-09-03 (×6): 400 mg via INTRAVENOUS
  Filled 2011-09-01 (×7): qty 200

## 2011-09-01 MED ORDER — SODIUM CHLORIDE 0.9 % IV BOLUS (SEPSIS)
500.0000 mL | Freq: Once | INTRAVENOUS | Status: AC
  Administered 2011-09-01: 500 mL via INTRAVENOUS

## 2011-09-01 MED ORDER — HYDROMORPHONE HCL PF 1 MG/ML IJ SOLN: 1.0000 mg | INTRAMUSCULAR | Status: DC | PRN

## 2011-09-01 MED ORDER — ATORVASTATIN CALCIUM 40 MG PO TABS
40.0000 mg | ORAL_TABLET | Freq: Every day | ORAL | Status: DC
  Administered 2011-09-01 – 2011-09-06 (×6): 40 mg via ORAL
  Filled 2011-09-01 (×7): qty 1

## 2011-09-01 MED ORDER — COLESEVELAM HCL 625 MG PO TABS
1875.0000 mg | ORAL_TABLET | Freq: Two times a day (BID) | ORAL | Status: DC
  Administered 2011-09-01 – 2011-09-06 (×11): 1875 mg via ORAL
  Filled 2011-09-01 (×15): qty 3

## 2011-09-01 MED ORDER — SODIUM CHLORIDE 0.9 % IV BOLUS (SEPSIS)
1000.0000 mL | Freq: Once | INTRAVENOUS | Status: AC
  Administered 2011-09-01: 1000 mL via INTRAVENOUS

## 2011-09-01 MED ORDER — METRONIDAZOLE IN NACL 5-0.79 MG/ML-% IV SOLN
500.0000 mg | Freq: Three times a day (TID) | INTRAVENOUS | Status: DC
  Administered 2011-09-01 – 2011-09-05 (×13): 500 mg via INTRAVENOUS
  Filled 2011-09-01 (×14): qty 100

## 2011-09-01 MED ORDER — ONDANSETRON HCL 4 MG/2ML IJ SOLN: 4.0000 mg | Freq: Four times a day (QID) | INTRAMUSCULAR | Status: DC | PRN

## 2011-09-01 MED ORDER — KCL IN DEXTROSE-NACL 20-5-0.9 MEQ/L-%-% IV SOLN
INTRAVENOUS | Status: DC
  Administered 2011-09-01 (×2): via INTRAVENOUS
  Administered 2011-09-02: 100 mL/h via INTRAVENOUS
  Administered 2011-09-02: 14:00:00 via INTRAVENOUS
  Administered 2011-09-02: 100 mL/h via INTRAVENOUS
  Filled 2011-09-01 (×8): qty 1000

## 2011-09-01 MED ORDER — ACETAMINOPHEN 325 MG PO TABS
650.0000 mg | ORAL_TABLET | Freq: Four times a day (QID) | ORAL | Status: DC | PRN
  Administered 2011-09-01 (×2): 650 mg via ORAL
  Filled 2011-09-01 (×4): qty 2

## 2011-09-01 MED ORDER — AMIODARONE IV BOLUS ONLY 150 MG/100ML
150.0000 mg | Freq: Once | INTRAVENOUS | Status: AC
  Administered 2011-09-02: 150 mg via INTRAVENOUS
  Filled 2011-09-01: qty 100

## 2011-09-01 MED ORDER — OMEGA-3-ACID ETHYL ESTERS 1 G PO CAPS
1.0000 g | ORAL_CAPSULE | Freq: Two times a day (BID) | ORAL | Status: DC
  Administered 2011-09-01 – 2011-09-06 (×12): 1 g via ORAL
  Filled 2011-09-01 (×14): qty 1

## 2011-09-01 MED ORDER — ADULT MULTIVITAMIN W/MINERALS CH
1.0000 | ORAL_TABLET | Freq: Every day | ORAL | Status: DC
  Administered 2011-09-01 – 2011-09-15 (×14): 1 via ORAL
  Filled 2011-09-01 (×15): qty 1

## 2011-09-01 MED ORDER — SODIUM CHLORIDE 0.9 % IJ SOLN
3.0000 mL | Freq: Two times a day (BID) | INTRAMUSCULAR | Status: DC
  Administered 2011-09-01 – 2011-09-06 (×8): 3 mL via INTRAVENOUS
  Administered 2011-09-06: 10:00:00 via INTRAVENOUS
  Administered 2011-09-07: 3 mL via INTRAVENOUS
  Administered 2011-09-07 (×2): 10 mL via INTRAVENOUS
  Administered 2011-09-08: 30 mL via INTRAVENOUS
  Administered 2011-09-08 – 2011-09-14 (×5): 3 mL via INTRAVENOUS

## 2011-09-01 MED ORDER — RALOXIFENE HCL 60 MG PO TABS
60.0000 mg | ORAL_TABLET | Freq: Every day | ORAL | Status: DC
  Administered 2011-09-01 – 2011-09-06 (×6): 60 mg via ORAL
  Filled 2011-09-01 (×7): qty 1

## 2011-09-01 NOTE — Progress Notes (Signed)
[facility] PROGRESS NOTE  [name] [name] ZOX:[id number] DOB: Oct 24, 1923 DOA: 08/31/2011 PCP: Henri Medal, MD  Assessment/Plan: 1. Diarrhea- infectious vs inflammatory on CT scan, GI consulted, cipro, flagyl started, c-diff and culture ordered 2. Hypotension- not responded to 4L IVF as well as I hoped, asked critical care to see, may need pressors for short time 3. AKI- IVF, ? From hypoperfusion with low BP, continue to trend 4. Dehydration- received 4L IVF 5. HLD  Code Status: full Family Communication:  Disposition Plan:   [name], D.O.  [facility] Pager [phone]  09/01/2011, 8:34 AM  LOS: 1 day    Consultants:  GI  Critical care   Subjective: Patient sleeping, easily awoken,  No CP, no SOB   Objective: Filed Vitals:   09/01/11 0530 09/01/11 0630 09/01/11 0715 09/01/11 0800  BP: 94/39 87/30 86/33    Pulse: 87 80 79   Temp: 101.3 F (38.5 C) 101.1 F (38.4 C) 100.6 F (38.1 C) 98.3 F (36.8 C)  TempSrc:    Oral  Resp: 21 23 23    Height:      Weight:      SpO2: 99% 96% 95%     Intake/Output Summary (Last 24 hours) at 09/01/11 0834 Last data filed at 09/01/11 0600  Gross per 24 hour  Intake 1553.33 ml  Output   1075 ml  Net 478.33 ml    Exam:  GEN: sleepy  HEENT: dry Mucous membranes PERRLA; EOM intact; no cervical lymphadenopathy nor thyromegaly or carotid bruit; no JVD;  CHEST: Normal respiration, clear to auscultation bilaterally  HEART: Regular rate and rhythm; no murmurs rubs or gallops  ABDOMEN: Obese, soft non-tender; no masses, no organomegaly, normal abdominal bowel sounds; no pannus; rectal foley inserted EXTREMITIES: No bone or joint deformity; age-appropriate arthropathy of the hands and knees; no edema; no ulcerations.  PULSES: 2+ and symmetric  SKIN:  no rash or ulceration  CNS: Cranial nerves 2-12 grossly intact no focal lateralizing neurologic deficit  Data Reviewed: Basic Metabolic  Panel:  Lab 09/01/11 0332 08/31/11 2207 08/30/11 2059  NA 134* 133* 135  K 4.1 3.9 --  CL 108 102 106  CO2 18* 16* 18*  GLUCOSE 111* 108* 125*  BUN 23 24* 21  CREATININE 1.70* 1.67* 0.99  CALCIUM 8.0* 9.2 8.5  MG -- -- --  PHOS -- -- --   Liver Function Tests:  Lab 08/31/11 2207 08/30/11 2059  AST 38* 18  ALT 16 13  ALKPHOS 52 50  BILITOT 1.0 0.7  PROT 6.6 5.9*  ALBUMIN 3.1* 3.1*    Lab 08/31/11 2207 08/30/11 2059  LIPASE 15 32  AMYLASE -- --   No results found for this basename: AMMONIA:5 in the last 168 hours CBC:  Lab 09/01/11 0332 08/31/11 2207 08/30/11 2059  WBC 4.3 4.1 10.4  NEUTROABS -- 3.3 9.5*  HGB 10.9* 12.1 10.8*  HCT 33.4* 37.1 32.2*  MCV 96.5 94.9 94.4  PLT 136* 132* 127*   Cardiac Enzymes:  Lab 08/31/11 2207  CKTOTAL --  CKMB --  CKMBINDEX --  TROPONINI <0.30   BNP: No components found with this basename: POCBNP:5 CBG: No results found for this basename: GLUCAP:5 in the last 168 hours  Recent Results (from the past 240 hour(s))  URINE CULTURE     Status: Normal (Preliminary result)   Collection Time   08/30/11  9:53 PM      Component Value Range Status Comment   Specimen Description URINE, CATHETERIZED  Final    Special Requests NONE   Final    Culture  Setup Time [id number]   Final    Colony Count PENDING   Incomplete    Culture Culture reincubated for better growth   Final    Report Status PENDING   Incomplete   MRSA PCR SCREENING     Status: Normal   Collection Time   09/01/11  1:49 AM      Component Value Range Status Comment   MRSA by PCR NEGATIVE  NEGATIVE Final      Studies: Ct Abdomen Pelvis W Contrast  08/31/2011  *RADIOLOGY REPORT*  Clinical Data: Lethargy, nausea, vomiting, diarrhea, and hypotension.  CT ABDOMEN AND PELVIS WITH CONTRAST  Technique:  Multidetector CT imaging of the abdomen and pelvis was performed following the standard protocol during bolus administration of intravenous contrast.  Contrast:  OMNIPAQUE IOHEXOL 300 MG/ML  SOLN  Comparison: None.  Findings: Mild dependent atelectasis in the lung bases.  Probable cardiac enlargement.  The liver, spleen, gallbladder, pancreas, adrenal glands, kidneys, and retroperitoneal lymph nodes are unremarkable.  Diffuse calcification and tortuosity of the aorta without aneurysm.  Flow is demonstrated in the mesenteric and portal vessels.  There is a small amount of fluid around the posterior liver edge on the right. The stomach and small bowel are decompressed.  Prominent visceral adipose tissues.  No free fluid or free air in the abdomen.  The colon is not distended but is fluid-filled consistent with liquid stool.  There is suggestion of mild diffuse colonic wall thickening suggesting edema or colitis.  Infectious or inflammatory colitis could have this appearance.  Pelvis:  The uterus is anteverted.  Uterus and adnexal structures are not enlarged.  Minimal free fluid in the pelvis.  Foley catheter decompresses the bladder.  No loculated pelvic fluid collections.  Diverticula in the sigmoid colon without diverticulitis.  The appendix is normal.  Extensive degenerative changes throughout the lumbar spine with mild lumbar scoliosis. Mild anterior subluxation of L4 on L5.  IMPRESSION: Mild diffuse wall thickening and liquid stool in the colon suggesting infectious or inflammatory process.  Minimal fluid around the right liver edge and in the pelvis.  No evidence of focal mass or abscess.  Original Report Authenticated By: Marlon Pel, M.D.   Dg Chest Port 1 View  08/31/2011  *RADIOLOGY REPORT*  Clinical Data: Cough.  Shortness of breath and weakness.  PORTABLE CHEST - 1 VIEW  Comparison: 02/04/2009  Findings: Shallow inspiration.  Cardiac enlargement with normal pulmonary vascularity.  No focal airspace consolidation or edema. No blunting of costophrenic angles.  No pneumothorax.  Surgical clips in the base of the neck.  Calcification of the aorta.  No  significant change since previous study, allowing for technical differences.  IMPRESSION: Cardiac enlargement without significant vascular congestion or edema.  Original Report Authenticated By: Marlon Pel, M.D.    Scheduled Meds:   . acetaminophen  650 mg Rectal Once  . atorvastatin  40 mg Oral Daily  . ciprofloxacin  400 mg Intravenous BID  . colesevelam  1,875 mg Oral BID WC  . metronidazole  500 mg Intravenous Q8H  . multivitamin with minerals  1 tablet Oral Daily  . omega-3 acid ethyl esters  1 g Oral BID  . raloxifene  60 mg Oral Daily  . sodium chloride  1,000 mL Intravenous Once  . sodium chloride  500 mL Intravenous Once  . sodium chloride  500 mL Intravenous Once  .  sodium chloride  500 mL Intravenous Once  . sodium chloride  3 mL Intravenous Q12H  . DISCONTD: sodium chloride  1,000 mL Intravenous Once   Continuous Infusions:   . dextrose 5 % and 0.9 % NaCl with KCl 20 mEq/L 100 mL/hr at 09/01/11 0600  . DISCONTD: sodium chloride 75 mL/hr at 08/31/11 2206

## 2011-09-01 NOTE — Progress Notes (Signed)
eLink Physician-Brief Progress Note Patient Name: [name] [name] DOB: Jan 19, 1924 MRN: [id number]  Date of Service  09/01/2011   HPI/Events of Note   Pt converted to A fib from NSR this pm, controlled rate. Suspect a response to hypovolemic shock state. No history of A fib per pt and notes. No indication to anticoagulate or rate control at this time but will follow.   eICU Interventions        Emylie Amster S. 09/01/2011, 10:02 PM

## 2011-09-01 NOTE — Progress Notes (Signed)
Name: [name] [name] MRN: [id number] DOB: 08-07-1923  ELECTRONIC ICU PHYSICIAN NOTE  Problem:  Shock   Lab 08/31/11 2207 08/30/11 2059  NA 133* 135  K 3.9 3.3*  CL 102 106  CO2 16* 18*  BUN 24* 21  CREATININE 1.67* 0.99  GLUCOSE 108* 125*    Lab 08/31/11 2207 08/30/11 2059  HGB 12.1 10.8*  HCT 37.1 32.2*  WBC 4.1 10.4  PLT 132* 127*    No intake or output data in the 24 hours ending 09/01/11 0337  Procalcitonin  5.54 6/26  Intervention:  One liter IV Fluid Check cortisol levels  Sandrea Hughs 09/01/2011, 3:36 AM

## 2011-09-01 NOTE — Consult Note (Signed)
Name: [name] [name] MRN: [id number] DOB: 1923-03-10    LOS: 1  Referring Provider:  Dr. Benjamine Mola / [facility]  Reason for Referral:  Hypotension / Concern for Sepsis  PULMONARY / CRITICAL CARE MEDICINE  HPI:  78 y/o F with PMH of HTN, HLD, PUD, COPD admitted to [facility] on 6/27 with a 4 day history of feeling weak, watery diarrhea, nausea and vomiting.  Had been seen by PCP and prescribed phenergan.  She was found lethargic and hypotensive after Phenergan.  Work up in ED demonstrated nml wbc, hbg, normal lipase / lactic acid.  Mild renal insufficiency, metabolic acidosis.  CT scan demonstrated diffuse colonic wall thickening consistent with colitis, either infectious or inflammatory. No local abscess.  BP responded to volume resuscitation.  PCCM consulted am of 6/27 for mild hypotension and concern for further decompensation.       Past Medical History  Diagnosis Date  . Hypertension   . Hyperlipidemia   . COPD (chronic obstructive pulmonary disease)   . Osteoporosis   . Peptic ulcer disease   . History of kidney stones   . Arthritis   . H/O hiatal hernia   . Chronic back pain    Past Surgical History  Procedure Date  . Carotid endarterectomy     bilateral  . Cataract extraction, bilateral   . Joint replacement 2008    Right Total Knee  . Anal fistula repair   . Left parotidectomy   . Eye surgery    Prior to Admission medications   Medication Sig Start Date End Date Taking? Authorizing Provider  allopurinol (ZYLOPRIM) 100 MG tablet Take 100 mg by mouth 2 (two) times daily.   Yes Historical Provider, MD  atenolol (TENORMIN) 50 MG tablet Take 50 mg by mouth daily.   Yes Historical Provider, MD  atorvastatin (LIPITOR) 40 MG tablet Take 40 mg by mouth daily.   Yes Historical Provider, MD  colesevelam (WELCHOL) 625 MG tablet Take 1,875 mg by mouth 2 (two) times daily with a meal.   Yes Historical Provider, MD  diphenoxylate-atropine (LOMOTIL) 2.5-0.025 MG per tablet Take 1 tablet by mouth 4  (four) times daily as needed for diarrhea or loose stools. 08/30/11 09/09/11 Yes Toy Baker, MD  furosemide (LASIX) 40 MG tablet Take 40 mg by mouth daily.   Yes Historical Provider, MD  Multiple Vitamin (MULTIVITAMIN WITH MINERALS) TABS Take 1 tablet by mouth daily.   Yes Historical Provider, MD  olmesartan (BENICAR) 20 MG tablet Take 20 mg by mouth daily.   Yes Historical Provider, MD  omega-3 acid ethyl esters (LOVAZA) 1 G capsule Take 1 g by mouth 2 (two) times daily.   Yes Historical Provider, MD  ondansetron (ZOFRAN) 4 MG tablet Take 1 tablet (4 mg total) by mouth every 6 (six) hours. 08/30/11 09/06/11 Yes Toy Baker, MD  raloxifene (EVISTA) 60 MG tablet Take 60 mg by mouth daily.   Yes Historical Provider, MD   Allergies Allergies  Allergen Reactions  . Demerol (Meperidine) Hives  . Penicillins Hives    Family History History reviewed. No pertinent family history. Social History  reports that she quit smoking about 28 years ago. Her smoking use included Cigarettes. She has a 20 pack-year smoking history. She has never used smokeless tobacco. She reports that she does not drink alcohol or use illicit drugs.  Review Of Systems:   Gen: Denies fever, chills, weight change, fatigue, night sweats HEENT: Denies blurred vision, double vision, hearing loss, tinnitus, sinus  congestion, rhinorrhea, sore throat, neck stiffness, dysphagia PULM: Denies shortness of breath, cough, sputum production, hemoptysis, wheezing CV: Denies chest pain, edema, orthopnea, paroxysmal nocturnal dyspnea, palpitations GI: Denies abdominal pain, hematochezia, melena, constipation, change in bowel habits.  Indicates nausea, vomiting, diarrhea.  GU: Denies dysuria, hematuria, polyuria, oliguria, urethral discharge Endocrine: Denies hot or cold intolerance, polyuria, polyphagia or appetite change Derm: Denies rash, dry skin, scaling or peeling skin change Heme: Denies easy bruising, bleeding, bleeding  gums Neuro: Denies headache, numbness, weakness, slurred speech, loss of memory or consciousness   Brief patient description:  78 y/o F admitted with watery diarrhea, nausea /vomiting.  Hypotensive in setting of volume depletion and phenergan.  BP responded to IVF.    Events Since Admission:   Current Status: Stable in ICU.  NAD.  Vital Signs: Temp:  [98.3 F (36.8 C)-102.9 F (39.4 C)] 101.5 F (38.6 C) (06/27 1300) Pulse Rate:  [32-89] 32  (06/27 1300) Resp:  [16-26] 20  (06/27 1300) BP: (62-110)/(27-60) 100/38 mmHg (06/27 1300) SpO2:  [85 %-100 %] 97 % (06/27 1300) Weight:  [187 lb 13.3 oz (85.2 kg)] 187 lb 13.3 oz (85.2 kg) (06/27 0200)  Physical Examination: General:  Chronically ill in NAD Neuro:  Awake/alert / oriented, speech clear, MAE HEENT:  Mm pink/moist, short neck Cardiovascular:  s1s2 rrr, no m/r/g Lungs:  resp's even/non-labored, lungs bilaterally clear Abdomen:   Obese / soft, bsx4 active Musculoskeletal:  No deformities Skin:  Warm/dry, no edema  Principal Problem:  *Colitis presumed infectious Active Problems:  Hypotension  Dehydration  Hyperlipidemia   ASSESSMENT AND PLAN  PULMONARY No results found for this basename: PHART:5,PCO2:5,PCO2ART:5,PO2ART:5,HCO3:5,O2SAT:5 in the last 168 hours Ventilator Settings:   CXR:  6/26 Cardiac enlargement without significant vascular congestion or edema. ETT:    A:   No acute process.  P:   -monitor / supportive care -pulmonary hygiene  CARDIOVASCULAR  Lab 08/31/11 2207  TROPONINI <0.30  LATICACIDVEN 1.8  PROBNP --   ECG:   Lines:   A:  Hypotension - resolving with IVF Hx of HTN Hx of Hypercholesterolemia  P:  -volume resuscitation -ICU admit -abx as below -hold home HTN regimen -if decompensates, will need TLC placement -continue lipitor, welchol  RENAL  Lab 09/01/11 0332 08/31/11 2207 08/30/11 2059  NA 134* 133* 135  K 4.1 3.9 --  CL 108 102 106  CO2 18* 16* 18*  BUN 23 24*  21  CREATININE 1.70* 1.67* 0.99  CALCIUM 8.0* 9.2 8.5  MG -- -- --  PHOS -- -- --   Intake/Output      06/26 0701 - 06/27 0700 06/27 0701 - 06/28 0700   I.V. (mL/kg) 353.3 (4.1) 600 (7)   IV Piggyback 1300 300   Total Intake(mL/kg) 1653.3 (19.4) 900 (10.6)   Urine (mL/kg/hr) 325 (0.2) 330 (0.5)   Stool 750    Total Output 1075 330   Net +578.3 +570         Foley:     A:   Acute renal failure P:   -trend sr cr -volume resuscitation -I/O's  GASTROINTESTINAL  Lab 08/31/11 2207 08/30/11 2059  AST 38* 18  ALT 16 13  ALKPHOS 52 50  BILITOT 1.0 0.7  PROT 6.6 5.9*  ALBUMIN 3.1* 3.1*    A:   Nausea / Vomiting CT concerning for Colitis Diarrhea  P:   -See ID section -PPI  HEMATOLOGIC  Lab 09/01/11 0332 08/31/11 2207 08/30/11 2059  HGB 10.9* 12.1 10.8*  HCT 33.4* 37.1 32.2*  PLT 136* 132* 127*  INR -- -- --  APTT -- -- --   A:   Mild Anemia Thrombocytopenia  P:  -no need for tx at this time -monitor h/h, platelets   - - PRBC for hgb </= 6.9gm%  As long as bp ok in gi bleed  - exceptions are   -  if ACS susepcted/confirmed then transfuse for hgb </= 8.0gm%,  or    -  If septic shock first 24h and scvo2 < 70% then transfuse for hgb </= 9.0gm%   - active bleeding with hemodynamic instability, then transfuse regardless of hemoglobin value   At at all times try to transfuse 1 unit prbc as possible with exception of active hemorrhage    INFECTIOUS  Lab 09/01/11 1620 09/01/11 0332 08/31/11 2207 08/30/11 2059  WBC -- 4.3 4.1 10.4  PROCALCITON 4.41 -- 5.54 --   Cultures: 6/27 MRSA PCR>>>neg 6/27 C-diff PCR>>>neg   Antibiotics: Flagyl 6/26>>> Cipro 6/26>>>  A:   Nausea / Vomiting Diarrhea Concern for Colitis r/o infectious process.   P:   -pan culture -abx as above -volume resuscitation -c-diff neg reassuring -1.5 liter positive, will give additional 1L bolus  -f/u lactate, PCT   ENDOCRINE No results found for this basename: GLUCAP:5 in  the last 168 hours A:   No acute issues  P:   -monitor glucose on BMP  NEUROLOGIC  A:   No acute issues  P:   -supportive care    BEST PRACTICE / DISPOSITION Level of Care:  ICU Primary Service:  [facility] Consultants:  PCCM Code Status:  Full Diet:  Per Primary DVT Px:  SCD's GI Px:  None indicated Skin Integrity:  Intact  Social / Family:  None available   Canary Brim, NP-C [facility] Pgr: [phone] or [phone]   09/01/2011, 2:36 PM   STAFF NOTE: I, Dr Lavinia Sharps have personally reviewed patient's available data, including medical history, events of note, physical examination and test results as part of my evaluation. I have discussed with resident/NP and other care providers such as pharmacist, RN and RRT.  In addition,  I personally evaluated patient and elicited key findings of  Colitis with resultant sepsis, hypotension and renal failure. Start aggressive volume resus, if not improving then needs pressors.  Rest per NP/medical resident whose note is outlined above and that I agree with  The patient is critically ill with multiple organ systems failure and requires high complexity decision making for assessment and support, frequent evaluation and titration of therapies, application of advanced monitoring technologies and extensive interpretation of multiple databases.   Critical Care Time devoted to patient care services described in this note is 31  Minutes.  Dr. Kalman Shan, M.D., [employer]C.P Pulmonary and Critical Care Medicine Staff Physician [facility] Pager: [phone], If no answer or between  15:00h - 7:00h: call [phone]  09/01/2011 5:32 PM

## 2011-09-01 NOTE — Consult Note (Signed)
Reason for Consult:Colitis Referring Physician: [facility]  [place] Ganc[place] HPI: This is an 78 year old female who is admitted with a diffuse colitis.  The patient reports that she was not feeling well and she was lethargic.  Her family members brought her into the ER and she was identified to have a diffuse colitis.  Accompanying her colitis was a complaint of diarrhea.  Her C. Diff PCR is negative and she denies any infectious contacts.  No abdominal pain.  She reports having a colonoscopy a few years back for routine purposes with negative results.  Past Medical History  Diagnosis Date  . Hypertension   . Hyperlipidemia   . COPD (chronic obstructive pulmonary disease)   . Osteoporosis   . Peptic ulcer disease   . History of kidney stones   . Arthritis   . H/O hiatal hernia   . Chronic back pain     Past Surgical History  Procedure Date  . Carotid endarterectomy     bilateral  . Cataract extraction, bilateral   . Joint replacement 2008    Right Total Knee  . Anal fistula repair   . Left parotidectomy   . Eye surgery     History reviewed. No pertinent family history.  Social History:  reports that she quit smoking about 28 years ago. Her smoking use included Cigarettes. She has a 20 pack-year smoking history. She has never used smokeless tobacco. She reports that she does not drink alcohol or use illicit drugs.  Allergies:  Allergies  Allergen Reactions  . Demerol (Meperidine) Hives  . Penicillins Hives    Medications:  Scheduled:   . acetaminophen  650 mg Rectal Once  . atorvastatin  40 mg Oral Daily  . ciprofloxacin  400 mg Intravenous BID  . colesevelam  1,875 mg Oral BID WC  . metronidazole  500 mg Intravenous Q8H  . multivitamin with minerals  1 tablet Oral Daily  . omega-3 acid ethyl esters  1 g Oral BID  . raloxifene  60 mg Oral Daily  . sodium chloride  1,000 mL Intravenous Once  . sodium chloride  1,000 mL Intravenous Once  . sodium chloride  500  mL Intravenous Once  . sodium chloride  500 mL Intravenous Once  . sodium chloride  500 mL Intravenous Once  . sodium chloride  3 mL Intravenous Q12H  . DISCONTD: sodium chloride  1,000 mL Intravenous Once   Continuous:   . dextrose 5 % and 0.9 % NaCl with KCl 20 mEq/L 100 mL/hr at 09/01/11 0600  . DISCONTD: sodium chloride 75 mL/hr at 08/31/11 2206    Results for orders placed during the hospital encounter of 08/31/11 (from the past 24 hour(s))  CBC WITH DIFFERENTIAL     Status: Abnormal   Collection Time   08/31/11 10:07 PM      Component Value Range   WBC 4.1  4.0 - 10.5 K/uL   RBC 3.91  3.87 - 5.11 MIL/uL   Hemoglobin 12.1  12.0 - 15.0 g/dL   HCT 96.0  45.4 - 09.8 %   MCV 94.9  78.0 - 100.0 fL   MCH 30.9  26.0 - 34.0 pg   MCHC 32.6  30.0 - 36.0 g/dL   RDW 11.9  14.7 - 82.9 %   Platelets 132 (*) 150 - 400 K/uL   Neutrophils Relative 81 (*) 43 - 77 %   Lymphocytes Relative 14  12 - 46 %   Monocytes Relative 4  3 - 12 %   Eosinophils Relative 0  0 - 5 %   Basophils Relative 1  0 - 1 %   Neutro Abs 3.3  1.7 - 7.7 K/uL   Lymphs Abs 0.6 (*) 0.7 - 4.0 K/uL   Monocytes Absolute 0.2  0.1 - 1.0 K/uL   Eosinophils Absolute 0.0  0.0 - 0.7 K/uL   Basophils Absolute 0.0  0.0 - 0.1 K/uL   WBC Morphology INCREASED BANDS (>20% BANDS)    COMPREHENSIVE METABOLIC PANEL     Status: Abnormal   Collection Time   08/31/11 10:07 PM      Component Value Range   Sodium 133 (*) 135 - 145 mEq/L   Potassium 3.9  3.5 - 5.1 mEq/L   Chloride 102  96 - 112 mEq/L   CO2 16 (*) 19 - 32 mEq/L   Glucose, Bld 108 (*) 70 - 99 mg/dL   BUN 24 (*) 6 - 23 mg/dL   Creatinine, Ser 1.61 (*) 0.50 - 1.10 mg/dL   Calcium 9.2  8.4 - 09.6 mg/dL   Total Protein 6.6  6.0 - 8.3 g/dL   Albumin 3.1 (*) 3.5 - 5.2 g/dL   AST 38 (*) 0 - 37 U/L   ALT 16  0 - 35 U/L   Alkaline Phosphatase 52  39 - 117 U/L   Total Bilirubin 1.0  0.3 - 1.2 mg/dL   GFR calc non Af Amer 27 (*) >90 mL/min   GFR calc Af Amer 31 (*) >90 mL/min    TROPONIN I     Status: Normal   Collection Time   08/31/11 10:07 PM      Component Value Range   Troponin I <0.30  <0.30 ng/mL  PROCALCITONIN     Status: Normal   Collection Time   08/31/11 10:07 PM      Component Value Range   Procalcitonin 5.54    LACTIC ACID, PLASMA     Status: Normal   Collection Time   08/31/11 10:07 PM      Component Value Range   Lactic Acid, Venous 1.8  0.5 - 2.2 mmol/L  LIPASE, BLOOD     Status: Normal   Collection Time   08/31/11 10:07 PM      Component Value Range   Lipase 15  11 - 59 U/L  CORTISOL     Status: Normal   Collection Time   08/31/11 10:07 PM      Component Value Range   Cortisol, Plasma 24.6    URINALYSIS, ROUTINE W REFLEX MICROSCOPIC     Status: Abnormal   Collection Time   08/31/11 10:37 PM      Component Value Range   Color, Urine AMBER (*) YELLOW   APPearance CLOUDY (*) CLEAR   Specific Gravity, Urine 1.024  1.005 - 1.030   pH 5.5  5.0 - 8.0   Glucose, UA NEGATIVE  NEGATIVE mg/dL   Hgb urine dipstick LARGE (*) NEGATIVE   Bilirubin Urine SMALL (*) NEGATIVE   Ketones, ur TRACE (*) NEGATIVE mg/dL   Protein, ur 045 (*) NEGATIVE mg/dL   Urobilinogen, UA 0.2  0.0 - 1.0 mg/dL   Nitrite POSITIVE (*) NEGATIVE   Leukocytes, UA SMALL (*) NEGATIVE  URINE MICROSCOPIC-ADD ON     Status: Abnormal   Collection Time   08/31/11 10:37 PM      Component Value Range   Squamous Epithelial / LPF FEW (*) RARE   WBC, UA 3-6  <3  WBC/hpf   RBC / HPF 0-2  <3 RBC/hpf   Bacteria, UA MANY (*) RARE  CLOSTRIDIUM DIFFICILE BY PCR     Status: Normal   Collection Time   09/01/11  1:49 AM      Component Value Range   C difficile by pcr NEGATIVE  NEGATIVE  MRSA PCR SCREENING     Status: Normal   Collection Time   09/01/11  1:49 AM      Component Value Range   MRSA by PCR NEGATIVE  NEGATIVE  BASIC METABOLIC PANEL     Status: Abnormal   Collection Time   09/01/11  3:32 AM      Component Value Range   Sodium 134 (*) 135 - 145 mEq/L   Potassium 4.1  3.5 -  5.1 mEq/L   Chloride 108  96 - 112 mEq/L   CO2 18 (*) 19 - 32 mEq/L   Glucose, Bld 111 (*) 70 - 99 mg/dL   BUN 23  6 - 23 mg/dL   Creatinine, Ser 6.21 (*) 0.50 - 1.10 mg/dL   Calcium 8.0 (*) 8.4 - 10.5 mg/dL   GFR calc non Af Amer 26 (*) >90 mL/min   GFR calc Af Amer 30 (*) >90 mL/min  CBC     Status: Abnormal   Collection Time   09/01/11  3:32 AM      Component Value Range   WBC 4.3  4.0 - 10.5 K/uL   RBC 3.46 (*) 3.87 - 5.11 MIL/uL   Hemoglobin 10.9 (*) 12.0 - 15.0 g/dL   HCT 30.8 (*) 65.7 - 84.6 %   MCV 96.5  78.0 - 100.0 fL   MCH 31.5  26.0 - 34.0 pg   MCHC 32.6  30.0 - 36.0 g/dL   RDW 96.2  95.2 - 84.1 %   Platelets 136 (*) 150 - 400 K/uL     Ct Abdomen Pelvis W Contrast  08/31/2011  *RADIOLOGY REPORT*  Clinical Data: Lethargy, nausea, vomiting, diarrhea, and hypotension.  CT ABDOMEN AND PELVIS WITH CONTRAST  Technique:  Multidetector CT imaging of the abdomen and pelvis was performed following the standard protocol during bolus administration of intravenous contrast.  Contrast: OMNIPAQUE IOHEXOL 300 MG/ML  SOLN  Comparison: None.  Findings: Mild dependent atelectasis in the lung bases.  Probable cardiac enlargement.  The liver, spleen, gallbladder, pancreas, adrenal glands, kidneys, and retroperitoneal lymph nodes are unremarkable.  Diffuse calcification and tortuosity of the aorta without aneurysm.  Flow is demonstrated in the mesenteric and portal vessels.  There is a small amount of fluid around the posterior liver edge on the right. The stomach and small bowel are decompressed.  Prominent visceral adipose tissues.  No free fluid or free air in the abdomen.  The colon is not distended but is fluid-filled consistent with liquid stool.  There is suggestion of mild diffuse colonic wall thickening suggesting edema or colitis.  Infectious or inflammatory colitis could have this appearance.  Pelvis:  The uterus is anteverted.  Uterus and adnexal structures are not enlarged.  Minimal  free fluid in the pelvis.  Foley catheter decompresses the bladder.  No loculated pelvic fluid collections.  Diverticula in the sigmoid colon without diverticulitis.  The appendix is normal.  Extensive degenerative changes throughout the lumbar spine with mild lumbar scoliosis. Mild anterior subluxation of L4 on L5.  IMPRESSION: Mild diffuse wall thickening and liquid stool in the colon suggesting infectious or inflammatory process.  Minimal fluid around the right liver  edge and in the pelvis.  No evidence of focal mass or abscess.  Original Report Authenticated By: Marlon Pel, M.D.   Dg Chest Port 1 View  08/31/2011  *RADIOLOGY REPORT*  Clinical Data: Cough.  Shortness of breath and weakness.  PORTABLE CHEST - 1 VIEW  Comparison: 02/04/2009  Findings: Shallow inspiration.  Cardiac enlargement with normal pulmonary vascularity.  No focal airspace consolidation or edema. No blunting of costophrenic angles.  No pneumothorax.  Surgical clips in the base of the neck.  Calcification of the aorta.  No significant change since previous study, allowing for technical differences.  IMPRESSION: Cardiac enlargement without significant vascular congestion or edema.  Original Report Authenticated By: Marlon Pel, M.D.    ROS:  As stated above in the HPI otherwise negative.  Blood pressure 100/38, pulse 32, temperature 101.5 F (38.6 C), temperature source Oral, resp. rate 20, height 5' (1.524 m), weight 85.2 kg (187 lb 13.3 oz), SpO2 97.00%.    PE: Gen: NAD, Alert and Oriented HEENT:  [place]/AT, EOMI Neck: Supple, no LAD Lungs: CTA Bilaterally CV: RRR without M/G/R ABM: Soft, NTND, +BS Ext: No C/C/E  Assessment/Plan: 1) Abnormal CT scan. 2) ? Colitis. 3) Diarrhea.   I reviewed the CT scan and further evaluation with a FFS is in order.    Plan: 1) FFS with biopsies tomorrow. 2) Continue with antibiotics for now.  Jaidon Sponsel D 09/01/2011, 3:18 PM

## 2011-09-01 NOTE — H&P (Signed)
PCP:   Henri Medal, MD   Chief Complaint: Persistent diarrhea, fever, hypotension   HPI: [name] [name] is an 78 y.o. female with history of hypertension on blood pressure medication and diuretic, hyperlipidemia on a statin, COPD, history of peptic ulcer disease, status post bilateral endarterectomy, return to the emergency room as she become more lethargic after taking the Phenergan prescribed by her PCP. She continued to have watery diarrhea, and has been feeling more weak.  She was found hypotensive with systolic blood pressure of 60-70. Further workup showed normal white count and hemoglobin, elevated BUN and creatinine to 24 and 1.67, with normal lipase and lactic acid. Her urinalysis was cloudy with bacteria and 3-6 WBCs. Her potassium is 3.6 and her serum sodium was 133. She has mild metabolic acidosis with bicarbonate of 16. She had a abdominal pelvic CT with contrast which showed diffuse colonic wall thickening consistent with colitis, either infectious or inflammatory. No local abscess. She was given IV fluid and did response. Hospitalist was asked to admit her for dehydration, hypotension, and colitis.  Rewiew of Systems:  The patient denies anorexia, fever, weight loss,, vision loss, decreased hearing, hoarseness, chest pain, syncope, dyspnea on exertion, peripheral edema, balance deficits, hemoptysis, abdominal pain, melena, hematochezia, severe indigestion/heartburn, hematuria, incontinence, genital sores, muscle weakness, suspicious skin lesions, transient blindness, difficulty walking, depression, unusual weight change, abnormal bleeding, enlarged lymph nodes, angioedema, and breast masses.   Past Medical History  Diagnosis Date  . Hypertension   . Hyperlipidemia   . COPD (chronic obstructive pulmonary disease)   . Osteoporosis   . Peptic ulcer disease     Past Surgical History  Procedure Date  . Carotid endarterectomy     bilateral    Medications:  HOME  MEDS: Prior to Admission medications   Medication Sig Start Date End Date Taking? Authorizing Provider  allopurinol (ZYLOPRIM) 100 MG tablet Take 100 mg by mouth 2 (two) times daily.   Yes Historical Provider, MD  atenolol (TENORMIN) 50 MG tablet Take 50 mg by mouth daily.   Yes Historical Provider, MD  atorvastatin (LIPITOR) 40 MG tablet Take 40 mg by mouth daily.   Yes Historical Provider, MD  colesevelam (WELCHOL) 625 MG tablet Take 1,875 mg by mouth 2 (two) times daily with a meal.   Yes Historical Provider, MD  diphenoxylate-atropine (LOMOTIL) 2.5-0.025 MG per tablet Take 1 tablet by mouth 4 (four) times daily as needed for diarrhea or loose stools. 08/30/11 09/09/11 Yes Toy Baker, MD  furosemide (LASIX) 40 MG tablet Take 40 mg by mouth daily.   Yes Historical Provider, MD  Multiple Vitamin (MULTIVITAMIN WITH MINERALS) TABS Take 1 tablet by mouth daily.   Yes Historical Provider, MD  olmesartan (BENICAR) 20 MG tablet Take 20 mg by mouth daily.   Yes Historical Provider, MD  omega-3 acid ethyl esters (LOVAZA) 1 G capsule Take 1 g by mouth 2 (two) times daily.   Yes Historical Provider, MD  ondansetron (ZOFRAN) 4 MG tablet Take 1 tablet (4 mg total) by mouth every 6 (six) hours. 08/30/11 09/06/11 Yes Toy Baker, MD  raloxifene (EVISTA) 60 MG tablet Take 60 mg by mouth daily.   Yes Historical Provider, MD     Allergies:  Allergies  Allergen Reactions  . Demerol (Meperidine) Hives  . Penicillins Hives    Social History:   reports that she has quit smoking. She does not have any smokeless tobacco history on file. She reports that she does not drink  alcohol or use illicit drugs.  Family History: History reviewed. No pertinent family history.   Physical Exam: Filed Vitals:   09/01/11 0045 09/01/11 0047 09/01/11 0052 09/01/11 0100  BP:  98/36 90/42 94/38   Pulse:  84  85  Temp: 101.1 F (38.4 C) 101 F (38.3 C)  100.8 F (38.2 C)  TempSrc:      Resp:  26  23  SpO2:  96%   93%   Blood pressure 94/38, pulse 85, temperature 100.8 F (38.2 C), temperature source Rectal, resp. rate 23, SpO2 93.00%.  GEN:  Pleasant  person lying in the stretcher in no acute distress; cooperative with exam PSYCH:  alert and oriented x4; does not appear anxious or depressed; affect is appropriate. HEENT: Mucous membranes pink and anicteric; PERRLA; EOM intact; no cervical lymphadenopathy nor thyromegaly or carotid bruit; no JVD; Breasts:: Not examined CHEST WALL: No tenderness CHEST: Normal respiration, clear to auscultation bilaterally HEART: Regular rate and rhythm; no murmurs rubs or gallops BACK: No kyphosis or scoliosis; no CVA tenderness ABDOMEN: Obese, soft non-tender; no masses, no organomegaly, normal abdominal bowel sounds; no pannus; no intertriginous candida. Rectal Exam: Not done EXTREMITIES: No bone or joint deformity; age-appropriate arthropathy of the hands and knees; no edema; no ulcerations. Genitalia: not examined PULSES: 2+ and symmetric SKIN: Normal hydration no rash or ulceration CNS: Cranial nerves 2-12 grossly intact no focal lateralizing neurologic deficit   Labs & Imaging Results for orders placed during the hospital encounter of 08/31/11 (from the past 48 hour(s))  CBC WITH DIFFERENTIAL     Status: Abnormal   Collection Time   08/31/11 10:07 PM      Component Value Range Comment   WBC 4.1  4.0 - 10.5 K/uL    RBC 3.91  3.87 - 5.11 MIL/uL    Hemoglobin 12.1  12.0 - 15.0 g/dL    HCT 16.1  09.6 - 04.5 %    MCV 94.9  78.0 - 100.0 fL    MCH 30.9  26.0 - 34.0 pg    MCHC 32.6  30.0 - 36.0 g/dL    RDW 40.9  81.1 - 91.4 %    Platelets 132 (*) 150 - 400 K/uL    Neutrophils Relative 81 (*) 43 - 77 %    Lymphocytes Relative 14  12 - 46 %    Monocytes Relative 4  3 - 12 %    Eosinophils Relative 0  0 - 5 %    Basophils Relative 1  0 - 1 %    Neutro Abs 3.3  1.7 - 7.7 K/uL    Lymphs Abs 0.6 (*) 0.7 - 4.0 K/uL    Monocytes Absolute 0.2  0.1 - 1.0 K/uL     Eosinophils Absolute 0.0  0.0 - 0.7 K/uL    Basophils Absolute 0.0  0.0 - 0.1 K/uL    WBC Morphology INCREASED BANDS (>20% BANDS)     COMPREHENSIVE METABOLIC PANEL     Status: Abnormal   Collection Time   08/31/11 10:07 PM      Component Value Range Comment   Sodium 133 (*) 135 - 145 mEq/L REPEATED TO VERIFY   Potassium 3.9  3.5 - 5.1 mEq/L REPEATED TO VERIFY   Chloride 102  96 - 112 mEq/L REPEATED TO VERIFY   CO2 16 (*) 19 - 32 mEq/L REPEATED TO VERIFY   Glucose, Bld 108 (*) 70 - 99 mg/dL REPEATED TO VERIFY   BUN 24 (*) 6 - 23 mg/dL  Creatinine, Ser 1.67 (*) 0.50 - 1.10 mg/dL    Calcium 9.2  8.4 - 40.9 mg/dL REPEATED TO VERIFY   Total Protein 6.6  6.0 - 8.3 g/dL REPEATED TO VERIFY   Albumin 3.1 (*) 3.5 - 5.2 g/dL REPEATED TO VERIFY   AST 38 (*) 0 - 37 U/L REPEATED TO VERIFY   ALT 16  0 - 35 U/L REPEATED TO VERIFY   Alkaline Phosphatase 52  39 - 117 U/L REPEATED TO VERIFY   Total Bilirubin 1.0  0.3 - 1.2 mg/dL REPEATED TO VERIFY   GFR calc non Af Amer 27 (*) >90 mL/min    GFR calc Af Amer 31 (*) >90 mL/min   TROPONIN I     Status: Normal   Collection Time   08/31/11 10:07 PM      Component Value Range Comment   Troponin I <0.30  <0.30 ng/mL   PROCALCITONIN     Status: Normal   Collection Time   08/31/11 10:07 PM      Component Value Range Comment   Procalcitonin 5.54     LACTIC ACID, PLASMA     Status: Normal   Collection Time   08/31/11 10:07 PM      Component Value Range Comment   Lactic Acid, Venous 1.8  0.5 - 2.2 mmol/L   LIPASE, BLOOD     Status: Normal   Collection Time   08/31/11 10:07 PM      Component Value Range Comment   Lipase 15  11 - 59 U/L REPEATED TO VERIFY  URINALYSIS, ROUTINE W REFLEX MICROSCOPIC     Status: Abnormal   Collection Time   08/31/11 10:37 PM      Component Value Range Comment   Color, Urine AMBER (*) YELLOW BIOCHEMICALS MAY BE AFFECTED BY COLOR   APPearance CLOUDY (*) CLEAR    Specific Gravity, Urine 1.024  1.005 - 1.030    pH 5.5  5.0 -  8.0    Glucose, UA NEGATIVE  NEGATIVE mg/dL    Hgb urine dipstick LARGE (*) NEGATIVE    Bilirubin Urine SMALL (*) NEGATIVE    Ketones, ur TRACE (*) NEGATIVE mg/dL    Protein, ur 811 (*) NEGATIVE mg/dL    Urobilinogen, UA 0.2  0.0 - 1.0 mg/dL    Nitrite POSITIVE (*) NEGATIVE    Leukocytes, UA SMALL (*) NEGATIVE   URINE MICROSCOPIC-ADD ON     Status: Abnormal   Collection Time   08/31/11 10:37 PM      Component Value Range Comment   Squamous Epithelial / LPF FEW (*) RARE    WBC, UA 3-6  <3 WBC/hpf    RBC / HPF 0-2  <3 RBC/hpf    Bacteria, UA MANY (*) RARE    Ct Abdomen Pelvis W Contrast  08/31/2011  *RADIOLOGY REPORT*  Clinical Data: Lethargy, nausea, vomiting, diarrhea, and hypotension.  CT ABDOMEN AND PELVIS WITH CONTRAST  Technique:  Multidetector CT imaging of the abdomen and pelvis was performed following the standard protocol during bolus administration of intravenous contrast.  Contrast: OMNIPAQUE IOHEXOL 300 MG/ML  SOLN  Comparison: None.  Findings: Mild dependent atelectasis in the lung bases.  Probable cardiac enlargement.  The liver, spleen, gallbladder, pancreas, adrenal glands, kidneys, and retroperitoneal lymph nodes are unremarkable.  Diffuse calcification and tortuosity of the aorta without aneurysm.  Flow is demonstrated in the mesenteric and portal vessels.  There is a small amount of fluid around the posterior liver edge on the right.  The stomach and small bowel are decompressed.  Prominent visceral adipose tissues.  No free fluid or free air in the abdomen.  The colon is not distended but is fluid-filled consistent with liquid stool.  There is suggestion of mild diffuse colonic wall thickening suggesting edema or colitis.  Infectious or inflammatory colitis could have this appearance.  Pelvis:  The uterus is anteverted.  Uterus and adnexal structures are not enlarged.  Minimal free fluid in the pelvis.  Foley catheter decompresses the bladder.  No loculated pelvic fluid  collections.  Diverticula in the sigmoid colon without diverticulitis.  The appendix is normal.  Extensive degenerative changes throughout the lumbar spine with mild lumbar scoliosis. Mild anterior subluxation of L4 on L5.  IMPRESSION: Mild diffuse wall thickening and liquid stool in the colon suggesting infectious or inflammatory process.  Minimal fluid around the right liver edge and in the pelvis.  No evidence of focal mass or abscess.  Original Report Authenticated By: Marlon Pel, M.D.   Dg Chest Port 1 View  08/31/2011  *RADIOLOGY REPORT*  Clinical Data: Cough.  Shortness of breath and weakness.  PORTABLE CHEST - 1 VIEW  Comparison: 02/04/2009  Findings: Shallow inspiration.  Cardiac enlargement with normal pulmonary vascularity.  No focal airspace consolidation or edema. No blunting of costophrenic angles.  No pneumothorax.  Surgical clips in the base of the neck.  Calcification of the aorta.  No significant change since previous study, allowing for technical differences.  IMPRESSION: Cardiac enlargement without significant vascular congestion or edema.  Original Report Authenticated By: Marlon Pel, M.D.      Assessment Present on Admission:  .Colitis presumed infectious .Hypotension .Dehydration .Hyperlipidemia Possible UTI Elevated BUN/creatinine  PLAN:  Patient presents with diarrhea and CT scan  is suggestive of colitis. I am suspicious that this may represent infectious colitis, but cannot exclude other causes. We'll send her stool for C. difficile and stool culture. I will start her on Flagyl and Cipro. This will cover her possible urinary tract infection as well. She is volume depleted with elevated BUN creatinine and hypotension, and she took her blood pressure pill and diuretic today as well. She would need IV fluid. The prior readings of her blood pressure may not be correct, as manually, it is markedly higher. She does have good radial pulses. Will continue her on IV  fluid. I will start her on clear liquid as she might need lower endoscopy. Please consult GI tomorrow morning. Her mild metabolic acidosis is most likely from her diarrhea. I was watching her snores, and I'm suspicious that she may have sleep apnea as well. I told her family that she should have a polysomnogram when she sees her primary care physician. She is full code, and will be admitted to step down under [facility] hospital[facility] service, because her blood pressure borderline. I suspect that they will be higher with IV fluids and holding of her antihypertensive medications.   Other plans as per orders.    [name] [name] 09/01/2011, 1:11 AM

## 2011-09-01 NOTE — ED Notes (Signed)
Pt's BP taken manually and BP cuff switched to other arm to obtain higher, more accurate reading. Dr. Conley Rolls notified.

## 2011-09-02 ENCOUNTER — Inpatient Hospital Stay (HOSPITAL_COMMUNITY): Payer: Medicare Other

## 2011-09-02 ENCOUNTER — Encounter (HOSPITAL_COMMUNITY): Payer: Self-pay | Admitting: *Deleted

## 2011-09-02 ENCOUNTER — Encounter (HOSPITAL_COMMUNITY)
Admission: EM | Disposition: A | Discharge status: Skilled Nursing Facility | Payer: Self-pay | Source: Home / Self Care | Attending: Internal Medicine

## 2011-09-02 DIAGNOSIS — E782 Mixed hyperlipidemia: Secondary | ICD-10-CM

## 2011-09-02 DIAGNOSIS — I959 Hypotension, unspecified: Secondary | ICD-10-CM

## 2011-09-02 DIAGNOSIS — A419 Sepsis, unspecified organism: Principal | ICD-10-CM

## 2011-09-02 DIAGNOSIS — E860 Dehydration: Secondary | ICD-10-CM

## 2011-09-02 DIAGNOSIS — I369 Nonrheumatic tricuspid valve disorder, unspecified: Secondary | ICD-10-CM

## 2011-09-02 DIAGNOSIS — K529 Noninfective gastroenteritis and colitis, unspecified: Secondary | ICD-10-CM

## 2011-09-02 LAB — BASIC METABOLIC PANEL
BUN: 21 mg/dL (ref 6–23)
Calcium: 7.4 mg/dL — ABNORMAL LOW (ref 8.4–10.5)
Creatinine, Ser: 1.57 mg/dL — ABNORMAL HIGH (ref 0.50–1.10)
GFR calc Af Amer: 35 mL/min — ABNORMAL LOW (ref >90)
GFR calc Af Amer: 34 mL/min — ABNORMAL LOW (ref >90)
GFR calc non Af Amer: 30 mL/min — ABNORMAL LOW (ref >90)
GFR calc non Af Amer: 29 mL/min — ABNORMAL LOW (ref >90)
Potassium: 3.8 mEq/L (ref 3.5–5.1)
Potassium: 3.6 mEq/L (ref 3.5–5.1)
Sodium: 133 mEq/L — ABNORMAL LOW (ref 135–145)

## 2011-09-02 LAB — CBC
MCH: 31.7 pg (ref 26.0–34.0)
MCHC: 32.7 g/dL (ref 30.0–36.0)
Platelets: 119 10*3/uL — ABNORMAL LOW (ref 150–400)
RDW: 14.5 % (ref 11.5–15.5)

## 2011-09-02 LAB — LACTIC ACID, PLASMA: Lactic Acid, Venous: 1.3 mmol/L (ref 0.5–2.2)

## 2011-09-02 LAB — CARDIAC PANEL(CRET KIN+CKTOT+MB+TROPI)
Relative Index: 0.6 (ref 0.0–2.5)
Total CK: 1745 U/L — ABNORMAL HIGH (ref 7–177)

## 2011-09-02 SURGERY — SIGMOIDOSCOPY, FLEXIBLE
Anesthesia: Moderate Sedation

## 2011-09-02 MED ORDER — SODIUM CHLORIDE 0.9 % IV BOLUS (SEPSIS): 1000.0000 mL | Freq: Once | INTRAVENOUS | Status: DC

## 2011-09-02 MED ORDER — SODIUM CHLORIDE 0.9 % IV BOLUS (SEPSIS)
1000.0000 mL | Freq: Once | INTRAVENOUS | Status: AC
  Administered 2011-09-02: 1000 mL via INTRAVENOUS

## 2011-09-02 MED ORDER — MAGNESIUM SULFATE 40 MG/ML IJ SOLN
2.0000 g | Freq: Once | INTRAMUSCULAR | Status: AC
  Administered 2011-09-02: 2 g via INTRAVENOUS
  Filled 2011-09-02: qty 50

## 2011-09-02 MED ORDER — FLEET ENEMA 7-19 GM/118ML RE ENEM
2.0000 | ENEMA | Freq: Once | RECTAL | Status: AC
  Administered 2011-09-02: 2 via RECTAL
  Filled 2011-09-02: qty 2

## 2011-09-02 MED ORDER — PHENYLEPHRINE HCL 10 MG/ML IJ SOLN
30.0000 ug/min | INTRAMUSCULAR | Status: DC
  Administered 2011-09-02: 75 ug/min via INTRAVENOUS
  Filled 2011-09-02: qty 4

## 2011-09-02 MED ORDER — SODIUM CHLORIDE 0.9 % IV SOLN
1.0000 g | Freq: Once | INTRAVENOUS | Status: AC
  Administered 2011-09-02: 1 g via INTRAVENOUS
  Filled 2011-09-02: qty 10

## 2011-09-02 MED ORDER — MAGNESIUM SULFATE 50 % IJ SOLN: 2.0000 g | Freq: Once | INTRAVENOUS | Status: DC

## 2011-09-02 MED ORDER — DEXTROSE 5 % IV SOLN
30.0000 ug/min | INTRAVENOUS | Status: DC
  Administered 2011-09-02: 75 ug/min via INTRAVENOUS
  Administered 2011-09-02: 50 ug/min via INTRAVENOUS
  Filled 2011-09-02 (×2): qty 1

## 2011-09-02 NOTE — Progress Notes (Signed)
CRITICAL VALUE ALERT  Critical value received:  CK-MB  Date of notification:  09/02/11  Time of notification:  0153  Critical value read back:yes  Nurse who received alert:  Daryl Eastern, RN  MD notified (1st page):  Elink  Time of first page:  0159  MD notified (2nd page):  Time of second page:  Responding MD:  Pola Corn MD, via [facility]   Time MD responded:  0159 via [facility]

## 2011-09-02 NOTE — Care Management Note (Unsigned)
    Page 1 of 2   09/12/2011     5:24:27 PM   CARE MANAGEMENT NOTE 09/12/2011  Patient:  [name] [name],[name] [name]   Account Number:  [id number]  Date Initiated:  09/02/2011  Documentation initiated by:  Lanier Clam  Subjective/Objective Assessment:   ADMITTED W/COLITIS     Action/Plan:   FROM HOME   Anticipated DC Date:  09/15/2011   Anticipated DC Plan:  SKILLED NURSING FACILITY  In-house referral  Clinical Social Worker      DC Planning Services  CM consult      Choice offered to / List presented to:             Status of service:  In process, will continue to follow Medicare Important Message given?   (If response is "NO", the following Medicare IM given date fields will be blank) Date Medicare IM given:   Date Additional Medicare IM given:    Discharge Disposition:    Per UR Regulation:  Reviewed for med. necessity/level of care/duration of stay  If discussed at Long Length of Stay Meetings, dates discussed:   09/07/2011    Comments:  09/12/11 Joel Mericle RN,BSN NCM [phone] [place],STOOLS SENT FOR STUDIES,?C DIFF.D/C PLANS SNF WHEN MED STABLE.  09/09/11 Lamarius Dirr RN,BSN  NCM [phone] TRANSFERRED TO TELE.D/C PLANS SNF.  09/07/11 Beryle Bagsby RN,BSN NCM [phone] CONTINUE TO MONITOR PROGRESS.  09/05/11 Channa Hazelett RN,BSN NCM [phone] CONTINUE TO MONITOR PROGRESS  09/02/11 Luella Gardenhire RN,BSN NCM [phone]

## 2011-09-02 NOTE — Consult Note (Signed)
Name: [name] [name] MRN: [id number] DOB: [birth year]    LOS: 2  Referring Provider:  Dr. Benjamine Mola / [facility]  Reason for Referral:  Hypotension / Concern for Sepsis  PULMONARY / CRITICAL CARE MEDICINE  HPI:  78 y/o F with PMH of HTN, HLD, PUD, COPD admitted to [facility] on 6/27 with a 4 day history of feeling weak, watery diarrhea, nausea and vomiting.  Had been seen by PCP and prescribed phenergan.  She was found lethargic and hypotensive after Phenergan.  Work up in ED demonstrated nml wbc, hbg, normal lipase / lactic acid.  Mild renal insufficiency, metabolic acidosis.  CT scan demonstrated diffuse colonic wall thickening consistent with colitis, either infectious or inflammatory. No local abscess.  BP responded to volume resuscitation.  PCCM consulted am of 6/27 for mild hypotension and concern for further decompensation.       Events Since Admission: 6/28 Remains hypotensive with good mentation (unclear if BP's accurate), continues to have watery diarrhea   Current Status:  Good mentation, low BP readings, watery diarrhea  Vital Signs: Temp:  [98.9 F (37.2 C)-101.5 F (38.6 C)] 100.2 F (37.9 C) (06/28 0800) Pulse Rate:  [28-111] 97  (06/28 0800) Resp:  [14-26] 22  (06/28 0800) BP: (70-100)/(29-52) 88/48 mmHg (06/28 1000) SpO2:  [97 %-100 %] 99 % (06/28 0800) Weight:  [205 lb 11 oz (93.3 kg)] 205 lb 11 oz (93.3 kg) (06/28 0109)  Physical Examination: General:  Chronically ill in NAD Neuro:  Awake/alert / oriented, speech clear, MAE HEENT:  Mm pink/moist, short neck Cardiovascular:  s1s2 rrr, no m/r/g Lungs:  resp's even/non-labored, lungs bilaterally clear Abdomen:   Obese / soft, bsx4 active Musculoskeletal:  No deformities Skin:  Warm/dry, no edema  Principal Problem:  *Colitis presumed infectious Active Problems:  Hypotension  Dehydration  Hyperlipidemia  Acute renal failure   ASSESSMENT AND PLAN  PULMONARY No results found for this basename:  PHART:5,PCO2:5,PCO2ART:5,PO2ART:5,HCO3:5,O2SAT:5 in the last 168 hours Ventilator Settings:   CXR:  6/26 Cardiac enlargement without significant vascular congestion or edema. ETT:    A:   No acute process.  P:   -monitor / supportive care -pulmonary hygiene  CARDIOVASCULAR  Lab 09/02/11 0015 09/01/11 1620 08/31/11 2207  TROPONINI <0.30 -- <0.30  LATICACIDVEN 1.3 1.3 1.8  PROBNP -- -- --    Lab 09/01/11 1620 08/31/11 2207  PROCALCITON 4.41 5.54     ECG:   Lines:   A:  Hypotension - initially mild improvement with IVF, now continues to have low bp and is in shock unclear if hypovolemic or septic Hx of HTN Hx of Hypercholesterolemia  P:  -volume resuscitation for cvp > 12 -place central line to assess cvp - goal >12 - , pressors for map > 65 - trend pct -abx as below -hold home HTN regimen -continue lipitor, welchol   RENAL  Lab 09/02/11 0333 09/02/11 0015 09/01/11 0332 08/31/11 2207 08/30/11 2059  NA 134* 133* 134* 133* 135  K 3.6 3.8 -- -- --  CL 116* 111 108 102 106  CO2 13* 13* 18* 16* 18*  BUN 21 23 23  24* 21  CREATININE 1.57* 1.52* 1.70* 1.67* 0.99  CALCIUM 6.8* 7.4* 8.0* 9.2 8.5  MG 1.5 1.6 -- -- --  PHOS -- -- -- -- --   Intake/Output      06/27 0701 - 06/28 0700 06/28 0701 - 06/29 0700   P.O. 60    I.V. (mL/kg) 2400 (25.7) 100 (1.1)   Other 1500  IV Piggyback 3801.7    Total Intake(mL/kg) 7761.7 (83.2) 100 (1.1)   Urine (mL/kg/hr) 795 (0.4) 20 (0)   Stool 1900    Total Output 2695 20   Net +5066.7 +80        Stool Occurrence 1 x     Foley:     A:   Acute renal failure Hypocalcemia Hypomagnesemia  P:   -trend sr cr -volume resuscitation -I/O's STAFF note: replete mag and calcium  GASTROINTESTINAL  Lab 08/31/11 2207 08/30/11 2059  AST 38* 18  ALT 16 13  ALKPHOS 52 50  BILITOT 1.0 0.7  PROT 6.6 5.9*  ALBUMIN 3.1* 3.1*    A:   Nausea / Vomiting CT concerning for Colitis Diarrhea  P:   -See ID  section -PPI  HEMATOLOGIC  Lab 09/02/11 0333 09/01/11 0332 08/31/11 2207 08/30/11 2059  HGB 9.9* 10.9* 12.1 10.8*  HCT 30.3* 33.4* 37.1 32.2*  PLT 119* 136* 132* 127*  INR -- -- -- --  APTT -- -- -- --   A:   Mild Anemia Thrombocytopenia  P:  -no need for tx at this time -monitor h/h, platelets   - - PRBC for hgb </= 6.9gm%  As long as bp ok in gi bleed  - exceptions are   -  if ACS susepcted/confirmed then transfuse for hgb </= 8.0gm%,  or    -  If septic shock first 24h and scvo2 < 70% then transfuse for hgb </= 9.0gm%   - active bleeding with hemodynamic instability, then transfuse regardless of hemoglobin value   At at all times try to transfuse 1 unit prbc as possible with exception of active hemorrhage    INFECTIOUS  Lab 09/02/11 0333 09/01/11 1620 09/01/11 0332 08/31/11 2207 08/30/11 2059  WBC 4.5 -- 4.3 4.1 10.4  PROCALCITON -- 4.41 -- 5.54 --   Cultures: 6/27 MRSA PCR>>>neg 6/27 C-diff PCR>>>neg   Antibiotics: Flagyl 6/26>>> Cipro 6/26>>>  A:   Nausea / Vomiting Diarrhea Concern for Colitis r/o infectious process -cdiff negative  P:   -pan culture -abx as above -volume resuscitation -c-diff neg reassuring -1.5 liter positive, will give additional 1L bolus  -f/u lactate, PCT   ENDOCRINE No results found for this basename: GLUCAP:5 in the last 168 hours A:   No acute issues  P:   -monitor glucose on BMP  NEUROLOGIC  A:   No acute issues  P:   -supportive care    BEST PRACTICE / DISPOSITION Level of Care:  ICU Primary Service:  [facility] Consultants:  PCCM Code Status:  Full Diet:  Per Primary DVT Px:  SCD's GI Px:  None indicated Skin Integrity:  Intact  Social / Family:  None available   Canary Brim, NP-C [facility] Pgr: [phone] or [phone]    09/02/2011 11:42 AM    STAFF NOTE: I, Dr Lavinia Sharps have personally reviewed patient's available data, including medical history, events of note,  physical examination and test results as part of my evaluation. I have discussed with resident/NP and other care providers such as pharmacist, RN and RRT.  In addition,  I personally evaluated patient and elicited key findings of shock due to hypovolemia v sepsis. Agree with cvl to guide Rx with fluids for cvp > 12 and pressors for map > 65. Will correct calcium and mag.  Rest per NP/medical resident whose note is outlined above and that I agree with  The patient is critically ill with multiple organ  systems failure and requires high complexity decision making for assessment and support, frequent evaluation and titration of therapies, application of advanced monitoring technologies and extensive interpretation of multiple databases.   Critical Care Time devoted to patient care services described in this note is  45  Minutes.  Dr. Kalman Shan, M.D., [facility].C.P Pulmonary and Critical Care Medicine Staff Physician [facility] [place] [facility] Pager: [phone], If no answer or between  15:00h - 7:00h: call [phone]  09/02/2011 7:03 PM

## 2011-09-02 NOTE — Progress Notes (Signed)
Bedside RN notified of low BP of 69.  Bedside RN said that manual cuff was usually more accurate, and tends to be higher than the NIBP cuff.  Critical care has also been consulted for continued low BP.  Will continue to monitor.

## 2011-09-02 NOTE — Progress Notes (Signed)
[facility] PROGRESS NOTE  [name] [name] NUU:[id number] DOB: [birth year] DOA: 08/31/2011 PCP: Henri Medal, MD  Assessment/Plan: 1. Diarrhea leading to sepsis- infectious vs inflammatory on CT scan, GI consulted- plan to do endoscopy, cipro, flagyl started, c-diff negtive and culture ordered 2. Hypotension- not responded to 4L IVF as well as I hoped, asked critical care to see, may need pressors for short time (septic shock) 3. AKI- IVF, ? From hypoperfusion with low BP, continue to trend 4. Dehydration- received 4L IVF 5. HLD 6. Increased CKs- IVF 7. UTI- on IV cipro- await cx results   Code Status: full Family Communication:  Disposition Plan: stillin ICU  [name], D.O.  [facility] Pager [phone]  09/02/2011, 8:31 AM  LOS: 2 days    Consultants:  GI  Critical care   Subjective: Patient sleeping, easily awoken,  Still having lots of output in rectal foley- more green sediment and fluid..    Objective: Filed Vitals:   09/02/11 0451 09/02/11 0500 09/02/11 0546 09/02/11 0600  BP: 88/42 78/37 92/46  81/43  Pulse: 92 93 100 102  Temp: 100.8 F (38.2 C) 100.8 F (38.2 C) 100.6 F (38.1 C) 100.4 F (38 C)  TempSrc:      Resp: 21 21 21 25   Height:      Weight:      SpO2: 99% 99% 100% 99%    Intake/Output Summary (Last 24 hours) at 09/02/11 0831 Last data filed at 09/02/11 0600  Gross per 24 hour  Intake 7561.67 ml  Output   2615 ml  Net 4946.67 ml    Exam:  GEN: sleepy  HEENT: dry Mucous membranes PERRLA; EOM intact; no cervical lymphadenopathy nor thyromegaly or carotid bruit; no JVD;  CHEST: Normal respiration, clear to auscultation bilaterally  HEART: Regular rate and rhythm; no murmurs rubs or gallops  ABDOMEN: Obese, soft non-tender; no masses, no organomegaly, normal abdominal bowel sounds; no pannus; rectal foley inserted EXTREMITIES: No bone or joint deformity; age-appropriate arthropathy of the hands and  knees; no edema; no ulcerations.  PULSES: 2+ and symmetric  SKIN:  no rash or ulceration  CNS: Cranial nerves 2-12 grossly intact no focal lateralizing neurologic deficit  Data Reviewed: Basic Metabolic Panel:  Lab 09/02/11 5638 09/02/11 0015 09/01/11 0332 08/31/11 2207 08/30/11 2059  NA 134* 133* 134* 133* 135  K 3.6 3.8 -- -- --  CL 116* 111 108 102 106  CO2 13* 13* 18* 16* 18*  GLUCOSE 156* 113* 111* 108* 125*  BUN 21 23 23  24* 21  CREATININE 1.57* 1.52* 1.70* 1.67* 0.99  CALCIUM 6.8* 7.4* 8.0* 9.2 8.5  MG 1.5 1.6 -- -- --  PHOS -- -- -- -- --   Liver Function Tests:  Lab 08/31/11 2207 08/30/11 2059  AST 38* 18  ALT 16 13  ALKPHOS 52 50  BILITOT 1.0 0.7  PROT 6.6 5.9*  ALBUMIN 3.1* 3.1*    Lab 08/31/11 2207 08/30/11 2059  LIPASE 15 32  AMYLASE -- --   No results found for this basename: AMMONIA:5 in the last 168 hours CBC:  Lab 09/02/11 0333 09/01/11 0332 08/31/11 2207 08/30/11 2059  WBC 4.5 4.3 4.1 10.4  NEUTROABS -- -- 3.3 9.5*  HGB 9.9* 10.9* 12.1 10.8*  HCT 30.3* 33.4* 37.1 32.2*  MCV 97.1 96.5 94.9 94.4  PLT 119* 136* 132* 127*   Cardiac Enzymes:  Lab 09/02/11 0015 08/31/11 2207  CKTOTAL 1745* --  CKMB 10.8* --  CKMBINDEX -- --  TROPONINI <0.30 <0.30   BNP: No components found with this basename: POCBNP:5 CBG: No results found for this basename: GLUCAP:5 in the last 168 hours  Recent Results (from the past 240 hour(s))  URINE CULTURE     Status: Normal (Preliminary result)   Collection Time   08/30/11  9:53 PM      Component Value Range Status Comment   Specimen Description URINE, CATHETERIZED   Final    Special Requests NONE   Final    Culture  Setup Time [id number]   Final    Colony Count >=100,000 COLONIES/ML   Final    Culture GRAM NEGATIVE RODS   Final    Report Status PENDING   Incomplete   URINE CULTURE     Status: Normal (Preliminary result)   Collection Time   08/31/11 10:37 PM      Component Value Range Status Comment    Specimen Description URINE, CATHETERIZED   Final    Special Requests NONE   Final    Culture  Setup Time [id number]   Final    Colony Count >=100,000 COLONIES/ML   Final    Culture GRAM NEGATIVE RODS   Final    Report Status PENDING   Incomplete   CULTURE, BLOOD (ROUTINE X 2)     Status: Normal (Preliminary result)   Collection Time   09/01/11 12:10 AM      Component Value Range Status Comment   Specimen Description BLOOD LEFT ANTECUBITAL   Final    Special Requests BOTTLES DRAWN AEROBIC AND ANAEROBIC 5CC   Final    Culture  Setup Time [id number]   Final    Culture     Final    Value:        BLOOD CULTURE RECEIVED NO GROWTH TO DATE CULTURE WILL BE HELD FOR 5 DAYS BEFORE ISSUING A FINAL NEGATIVE REPORT   Report Status PENDING   Incomplete   CULTURE, BLOOD (ROUTINE X 2)     Status: Normal (Preliminary result)   Collection Time   09/01/11 12:15 AM      Component Value Range Status Comment   Specimen Description BLOOD R FOOT   Final    Special Requests BOTTLES DRAWN AEROBIC AND ANAEROBIC   Final    Culture  Setup Time [id number]   Final    Culture     Final    Value:        BLOOD CULTURE RECEIVED NO GROWTH TO DATE CULTURE WILL BE HELD FOR 5 DAYS BEFORE ISSUING A FINAL NEGATIVE REPORT   Report Status PENDING   Incomplete   CLOSTRIDIUM DIFFICILE BY PCR     Status: Normal   Collection Time   09/01/11  1:49 AM      Component Value Range Status Comment   C difficile by pcr NEGATIVE  NEGATIVE Final   MRSA PCR SCREENING     Status: Normal   Collection Time   09/01/11  1:49 AM      Component Value Range Status Comment   MRSA by PCR NEGATIVE  NEGATIVE Final      Studies: Ct Abdomen Pelvis W Contrast  08/31/2011  *RADIOLOGY REPORT*  Clinical Data: Lethargy, nausea, vomiting, diarrhea, and hypotension.  CT ABDOMEN AND PELVIS WITH CONTRAST  Technique:  Multidetector CT imaging of the abdomen and pelvis was performed following the standard protocol during bolus administration of  intravenous contrast.  Contrast: OMNIPAQUE IOHEXOL 300 MG/ML  SOLN  Comparison: None.  Findings: Mild  dependent atelectasis in the lung bases.  Probable cardiac enlargement.  The liver, spleen, gallbladder, pancreas, adrenal glands, kidneys, and retroperitoneal lymph nodes are unremarkable.  Diffuse calcification and tortuosity of the aorta without aneurysm.  Flow is demonstrated in the mesenteric and portal vessels.  There is a small amount of fluid around the posterior liver edge on the right. The stomach and small bowel are decompressed.  Prominent visceral adipose tissues.  No free fluid or free air in the abdomen.  The colon is not distended but is fluid-filled consistent with liquid stool.  There is suggestion of mild diffuse colonic wall thickening suggesting edema or colitis.  Infectious or inflammatory colitis could have this appearance.  Pelvis:  The uterus is anteverted.  Uterus and adnexal structures are not enlarged.  Minimal free fluid in the pelvis.  Foley catheter decompresses the bladder.  No loculated pelvic fluid collections.  Diverticula in the sigmoid colon without diverticulitis.  The appendix is normal.  Extensive degenerative changes throughout the lumbar spine with mild lumbar scoliosis. Mild anterior subluxation of L4 on L5.  IMPRESSION: Mild diffuse wall thickening and liquid stool in the colon suggesting infectious or inflammatory process.  Minimal fluid around the right liver edge and in the pelvis.  No evidence of focal mass or abscess.  Original Report Authenticated By: Marlon Pel, M.D.   Dg Chest Port 1 View  08/31/2011  *RADIOLOGY REPORT*  Clinical Data: Cough.  Shortness of breath and weakness.  PORTABLE CHEST - 1 VIEW  Comparison: 02/04/2009  Findings: Shallow inspiration.  Cardiac enlargement with normal pulmonary vascularity.  No focal airspace consolidation or edema. No blunting of costophrenic angles.  No pneumothorax.  Surgical clips in the base of the neck.   Calcification of the aorta.  No significant change since previous study, allowing for technical differences.  IMPRESSION: Cardiac enlargement without significant vascular congestion or edema.  Original Report Authenticated By: Marlon Pel, M.D.    Scheduled Meds:    . amiodarone  150 mg Intravenous Once  . atorvastatin  40 mg Oral Daily  . ciprofloxacin  400 mg Intravenous BID  . colesevelam  1,875 mg Oral BID WC  . magnesium sulfate 1 - 4 g bolus IVPB  2 g Intravenous Once  . metronidazole  500 mg Intravenous Q8H  . multivitamin with minerals  1 tablet Oral Daily  . omega-3 acid ethyl esters  1 g Oral BID  . raloxifene  60 mg Oral Daily  . sodium chloride  1,000 mL Intravenous Once  . sodium chloride  1,000 mL Intravenous Once  . sodium chloride  1,000 mL Intravenous Once  . sodium chloride  1,000 mL Intravenous Once  . sodium chloride  500 mL Intravenous Once  . sodium chloride  3 mL Intravenous Q12H   Continuous Infusions:    . dextrose 5 % and 0.9 % NaCl with KCl 20 mEq/L 100 mL/hr (09/02/11 0249)

## 2011-09-02 NOTE — Progress Notes (Signed)
As the patient was being wheeled back into the endo unit her SBP was noted to be in the 70's.  Overall she appears to be stable.  Nursing informed me that she was to be placed on Levophed.  At this time I will cancel her FFS and perform it tomorrow at bedside in the interest of safety.

## 2011-09-02 NOTE — Progress Notes (Signed)
*  PRELIMINARY RESULTS* Echocardiogram 2D Echocardiogram has been performed.  Orange Hilligoss 09/02/2011, 8:43 AM

## 2011-09-03 ENCOUNTER — Inpatient Hospital Stay (HOSPITAL_COMMUNITY): Payer: Medicare Other

## 2011-09-03 ENCOUNTER — Encounter (HOSPITAL_COMMUNITY)
Admission: EM | Disposition: A | Discharge status: Skilled Nursing Facility | Payer: Self-pay | Source: Home / Self Care | Attending: Internal Medicine

## 2011-09-03 ENCOUNTER — Encounter (HOSPITAL_COMMUNITY): Payer: Self-pay | Admitting: *Deleted

## 2011-09-03 DIAGNOSIS — E860 Dehydration: Secondary | ICD-10-CM

## 2011-09-03 DIAGNOSIS — E782 Mixed hyperlipidemia: Secondary | ICD-10-CM

## 2011-09-03 DIAGNOSIS — A419 Sepsis, unspecified organism: Secondary | ICD-10-CM | POA: Diagnosis present

## 2011-09-03 DIAGNOSIS — I959 Hypotension, unspecified: Secondary | ICD-10-CM

## 2011-09-03 DIAGNOSIS — R6521 Severe sepsis with septic shock: Secondary | ICD-10-CM | POA: Diagnosis present

## 2011-09-03 DIAGNOSIS — K529 Noninfective gastroenteritis and colitis, unspecified: Secondary | ICD-10-CM

## 2011-09-03 HISTORY — PX: FLEXIBLE SIGMOIDOSCOPY: SHX5431

## 2011-09-03 LAB — BASIC METABOLIC PANEL
CO2: 13 mEq/L — ABNORMAL LOW (ref 19–32)
Chloride: 117 mEq/L — ABNORMAL HIGH (ref 96–112)
Creatinine, Ser: 2.2 mg/dL — ABNORMAL HIGH (ref 0.50–1.10)
Glucose, Bld: 126 mg/dL — ABNORMAL HIGH (ref 70–99)

## 2011-09-03 LAB — CBC
HCT: 32.3 % — ABNORMAL LOW (ref 36.0–46.0)
Hemoglobin: 10.5 g/dL — ABNORMAL LOW (ref 12.0–15.0)
MCH: 30.8 pg (ref 26.0–34.0)
MCV: 94.7 fL (ref 78.0–100.0)
Platelets: 167 10*3/uL (ref 150–400)
RBC: 3.41 MIL/uL — ABNORMAL LOW (ref 3.87–5.11)
WBC: 7.2 10*3/uL (ref 4.0–10.5)

## 2011-09-03 LAB — MAGNESIUM: Magnesium: 2.5 mg/dL (ref 1.5–2.5)

## 2011-09-03 LAB — PHOSPHORUS: Phosphorus: 2.3 mg/dL (ref 2.3–4.6)

## 2011-09-03 LAB — PROCALCITONIN: Procalcitonin: 2.33 ng/mL

## 2011-09-03 SURGERY — SIGMOIDOSCOPY, FLEXIBLE
Anesthesia: Moderate Sedation

## 2011-09-03 MED ORDER — BIOTENE DRY MOUTH MT LIQD
15.0000 mL | Freq: Two times a day (BID) | OROMUCOSAL | Status: DC
  Administered 2011-09-03 – 2011-09-06 (×7): 15 mL via OROMUCOSAL

## 2011-09-03 MED ORDER — HYDROCORTISONE SOD SUCCINATE 100 MG IJ SOLR
50.0000 mg | Freq: Four times a day (QID) | INTRAMUSCULAR | Status: DC
  Administered 2011-09-03 – 2011-09-04 (×4): 50 mg via INTRAVENOUS
  Filled 2011-09-03 (×3): qty 1
  Filled 2011-09-03 (×3): qty 2
  Filled 2011-09-03: qty 1
  Filled 2011-09-03: qty 2

## 2011-09-03 MED ORDER — FUROSEMIDE 10 MG/ML IJ SOLN
20.0000 mg | Freq: Once | INTRAMUSCULAR | Status: AC
  Administered 2011-09-03: 20 mg via INTRAVENOUS
  Filled 2011-09-03: qty 2

## 2011-09-03 MED ORDER — CIPROFLOXACIN IN D5W 400 MG/200ML IV SOLN
400.0000 mg | INTRAVENOUS | Status: DC
  Administered 2011-09-04 – 2011-09-05 (×2): 400 mg via INTRAVENOUS
  Filled 2011-09-03 (×3): qty 200

## 2011-09-03 MED ORDER — FENTANYL CITRATE 0.05 MG/ML IJ SOLN
INTRAMUSCULAR | Status: AC
  Filled 2011-09-03: qty 2

## 2011-09-03 MED ORDER — MIDAZOLAM HCL 10 MG/2ML IJ SOLN
INTRAMUSCULAR | Status: AC
  Filled 2011-09-03: qty 2

## 2011-09-03 MED ORDER — DIPHENHYDRAMINE HCL 50 MG/ML IJ SOLN
INTRAMUSCULAR | Status: AC
  Filled 2011-09-03: qty 1

## 2011-09-03 MED ORDER — LEVALBUTEROL HCL 0.63 MG/3ML IN NEBU
0.6300 mg | INHALATION_SOLUTION | Freq: Four times a day (QID) | RESPIRATORY_TRACT | Status: DC | PRN
  Administered 2011-09-03 (×2): 0.63 mg via RESPIRATORY_TRACT
  Filled 2011-09-03 (×2): qty 3

## 2011-09-03 NOTE — Progress Notes (Signed)
09/03/11 0250: Notified Gretchen ([facility]) of cortisol level <25.  Cortisol level 14.7.  Lab was drawn at 2000 09/02/11 from the patient's central line.  Daryl Eastern, RN

## 2011-09-03 NOTE — Progress Notes (Signed)
Pharmacy: Cipro  Cipro changed to Q24h dosing because on current CrCl < 30 ml/min.    Annis Lagoy, [place] Messing PharmD 2:11 PM 09/03/2011

## 2011-09-03 NOTE — Progress Notes (Signed)
Patient's A-line catheter pulled out, no waveform showing on monitor.  Notified MD.  Will D/C A-line as ordered.  Will continue to monitor.

## 2011-09-03 NOTE — H&P (View-Only) (Signed)
As the patient was being wheeled back into the endo unit her SBP was noted to be in the 70's.  Overall she appears to be stable.  Nursing informed me that she was to be placed on Levophed.  At this time I will cancel her FFS and perform it tomorrow at bedside in the interest of safety. 

## 2011-09-03 NOTE — Progress Notes (Signed)
[facility] PROGRESS NOTE  Kuulei Kleier Sondgeroth VOZ:[id number] DOB: January 16, 1924 DOA: 08/31/2011 PCP: Henri Medal, MD  Assessment/Plan: 1. Diarrhea leading to sepsis- infectious vs inflammatory on CT scan, GI consulted- flex sig done, biopsy s taken, cipro, flagyl started, c-diff negtive and culture ordered 2. Hypotension/septic shock- started on neo last PM- BP improved 3. AKI- IVF, ? From hypoperfusion with low BP, continue to trend 4. Wheezing- hold IVF for now, nebs ordered 5. Dehydration- received 4L IVF 6. HLD 7. Increased CKs- IVF 8. UTI- on IV cipro- e coli/enterobacter- sensitive to cipro   Code Status: full Family Communication:  Disposition Plan: still in ICU  [name], D.O.  [facility] Pager [phone]  09/03/2011, 10:00 AM  LOS: 3 days    Consultants:  GI  Critical care   Subjective: Patient sleeping, easily awoken,  Still having lots of output in rectal foley- more green sediment and fluid..    Objective: Filed Vitals:   09/03/11 0800 09/03/11 0830 09/03/11 0837 09/03/11 0900  BP: 159/51 173/67 183/59   Pulse: 97 92  99  Temp: 99 F (37.2 C)   97.7 F (36.5 C)  TempSrc: Core (Comment)     Resp: 20 16 24 11   Height:      Weight:      SpO2: 98% 99% 100% 99%    Intake/Output Summary (Last 24 hours) at 09/03/11 1000 Last data filed at 09/03/11 0900  Gross per 24 hour  Intake 3992.3 ml  Output   1710 ml  Net 2282.3 ml    Exam:  GEN: awake, getting bath CHEST: B/L wheezing, coarse BS HEART: tachy rate and rhythm; no murmurs rubs or gallops  ABDOMEN: Obese, soft non-tender; no masses, no organomegaly, normal abdominal bowel sounds; no pannus; rectal foley inserted EXTREMITIES: No bone or joint deformity; age-appropriate arthropathy of the hands and knees; no edema; no ulcerations.  PULSES: 2+ and symmetric  SKIN:  no rash or ulceration  CNS: Cranial nerves 2-12 grossly intact no focal lateralizing  neurologic deficit  Data Reviewed: Basic Metabolic Panel:  Lab 09/03/11 8756 09/02/11 0333 09/02/11 0015 09/01/11 0332 08/31/11 2207  NA 135 134* 133* 134* 133*  K 3.8 3.6 -- -- --  CL 117* 116* 111 108 102  CO2 13* 13* 13* 18* 16*  GLUCOSE 126* 156* 113* 111* 108*  BUN 23 21 23 23  24*  CREATININE 2.20* 1.57* 1.52* 1.70* 1.67*  CALCIUM 7.6* 6.8* 7.4* 8.0* 9.2  MG 2.5 1.5 1.6 -- --  PHOS 2.3 -- -- -- --   Liver Function Tests:  Lab 08/31/11 2207 08/30/11 2059  AST 38* 18  ALT 16 13  ALKPHOS 52 50  BILITOT 1.0 0.7  PROT 6.6 5.9*  ALBUMIN 3.1* 3.1*    Lab 08/31/11 2207 08/30/11 2059  LIPASE 15 32  AMYLASE -- --   No results found for this basename: AMMONIA:5 in the last 168 hours CBC:  Lab 09/03/11 0432 09/02/11 0333 09/01/11 0332 08/31/11 2207 08/30/11 2059  WBC 7.2 4.5 4.3 4.1 10.4  NEUTROABS -- -- -- 3.3 9.5*  HGB 10.5* 9.9* 10.9* 12.1 10.8*  HCT 32.3* 30.3* 33.4* 37.1 32.2*  MCV 94.7 97.1 96.5 94.9 94.4  PLT 167 119* 136* 132* 127*   Cardiac Enzymes:  Lab 09/02/11 0015 08/31/11 2207  CKTOTAL 1745* --  CKMB 10.8* --  CKMBINDEX -- --  TROPONINI <0.30 <0.30   BNP: No components found with this basename: POCBNP:5 CBG:  Lab 09/02/11 1620  GLUCAP 100*    Recent Results (from the past 240 hour(s))  URINE CULTURE     Status: Normal   Collection Time   08/30/11  9:53 PM      Component Value Range Status Comment   Specimen Description URINE, CATHETERIZED   Final    Special Requests NONE   Final    Culture  Setup Time 08/31/2011 02:49   Final    Colony Count >=100,000 COLONIES/ML   Final    Culture     Final    Value: ENTEROBACTER AEROGENES     ESCHERICHIA COLI   Report Status 09/03/2011 FINAL   Final    Organism ID, Bacteria ENTEROBACTER AEROGENES   Final    Organism ID, Bacteria ESCHERICHIA COLI   Final   URINE CULTURE     Status: Normal   Collection Time   08/31/11 10:37 PM      Component Value Range Status Comment   Specimen Description URINE,  CATHETERIZED   Final    Special Requests NONE   Final    Culture  Setup Time 09/01/2011 04:03   Final    Colony Count >=100,000 COLONIES/ML   Final    Culture ENTEROBACTER AEROGENES   Final    Report Status 09/03/2011 FINAL   Final    Organism ID, Bacteria ENTEROBACTER AEROGENES   Final   CULTURE, BLOOD (ROUTINE X 2)     Status: Normal (Preliminary result)   Collection Time   09/01/11 12:10 AM      Component Value Range Status Comment   Specimen Description BLOOD LEFT ANTECUBITAL   Final    Special Requests BOTTLES DRAWN AEROBIC AND ANAEROBIC 5CC   Final    Culture  Setup Time [id number]   Final    Culture     Final    Value:        BLOOD CULTURE RECEIVED NO GROWTH TO DATE CULTURE WILL BE HELD FOR 5 DAYS BEFORE ISSUING A FINAL NEGATIVE REPORT   Report Status PENDING   Incomplete   CULTURE, BLOOD (ROUTINE X 2)     Status: Normal (Preliminary result)   Collection Time   09/01/11 12:15 AM      Component Value Range Status Comment   Specimen Description BLOOD R FOOT   Final    Special Requests BOTTLES DRAWN AEROBIC AND ANAEROBIC   Final    Culture  Setup Time [id number]   Final    Culture     Final    Value:        BLOOD CULTURE RECEIVED NO GROWTH TO DATE CULTURE WILL BE HELD FOR 5 DAYS BEFORE ISSUING A FINAL NEGATIVE REPORT   Report Status PENDING   Incomplete   STOOL CULTURE     Status: Normal (Preliminary result)   Collection Time   09/01/11  1:48 AM      Component Value Range Status Comment   Specimen Description STOOL   Final    Special Requests NONE   Final    Culture Culture reincubated for better growth   Final    Report Status PENDING   Incomplete   CLOSTRIDIUM DIFFICILE BY PCR     Status: Normal   Collection Time   09/01/11  1:49 AM      Component Value Range Status Comment   C difficile by pcr NEGATIVE  NEGATIVE Final   MRSA PCR SCREENING     Status: Normal   Collection Time   09/01/11  1:49 AM  Component Value Range Status Comment   MRSA by PCR NEGATIVE   NEGATIVE Final      Studies: Ct Abdomen Pelvis W Contrast  08/31/2011  *RADIOLOGY REPORT*  Clinical Data: Lethargy, nausea, vomiting, diarrhea, and hypotension.  CT ABDOMEN AND PELVIS WITH CONTRAST  Technique:  Multidetector CT imaging of the abdomen and pelvis was performed following the standard protocol during bolus administration of intravenous contrast.  Contrast: OMNIPAQUE IOHEXOL 300 MG/ML  SOLN  Comparison: None.  Findings: Mild dependent atelectasis in the lung bases.  Probable cardiac enlargement.  The liver, spleen, gallbladder, pancreas, adrenal glands, kidneys, and retroperitoneal lymph nodes are unremarkable.  Diffuse calcification and tortuosity of the aorta without aneurysm.  Flow is demonstrated in the mesenteric and portal vessels.  There is a small amount of fluid around the posterior liver edge on the right. The stomach and small bowel are decompressed.  Prominent visceral adipose tissues.  No free fluid or free air in the abdomen.  The colon is not distended but is fluid-filled consistent with liquid stool.  There is suggestion of mild diffuse colonic wall thickening suggesting edema or colitis.  Infectious or inflammatory colitis could have this appearance.  Pelvis:  The uterus is anteverted.  Uterus and adnexal structures are not enlarged.  Minimal free fluid in the pelvis.  Foley catheter decompresses the bladder.  No loculated pelvic fluid collections.  Diverticula in the sigmoid colon without diverticulitis.  The appendix is normal.  Extensive degenerative changes throughout the lumbar spine with mild lumbar scoliosis. Mild anterior subluxation of L4 on L5.  IMPRESSION: Mild diffuse wall thickening and liquid stool in the colon suggesting infectious or inflammatory process.  Minimal fluid around the right liver edge and in the pelvis.  No evidence of focal mass or abscess.  Original Report Authenticated By: Marlon Pel, M.D.   Dg Chest Port 1 View  08/31/2011   *RADIOLOGY REPORT*  Clinical Data: Cough.  Shortness of breath and weakness.  PORTABLE CHEST - 1 VIEW  Comparison: 02/04/2009  Findings: Shallow inspiration.  Cardiac enlargement with normal pulmonary vascularity.  No focal airspace consolidation or edema. No blunting of costophrenic angles.  No pneumothorax.  Surgical clips in the base of the neck.  Calcification of the aorta.  No significant change since previous study, allowing for technical differences.  IMPRESSION: Cardiac enlargement without significant vascular congestion or edema.  Original Report Authenticated By: Marlon Pel, M.D.    Scheduled Meds:    . antiseptic oral rinse  15 mL Mouth Rinse BID  . atorvastatin  40 mg Oral Daily  . calcium gluconate  1 g Intravenous Once  . ciprofloxacin  400 mg Intravenous BID  . colesevelam  1,875 mg Oral BID WC  . hydrocortisone sod succinate (SOLU-CORTEF) injection  50 mg Intravenous Q6H  . magnesium sulfate 1 - 4 g bolus IVPB  2 g Intravenous Once  . magnesium sulfate 1 - 4 g bolus IVPB  2 g Intravenous Once  . metronidazole  500 mg Intravenous Q8H  . multivitamin with minerals  1 tablet Oral Daily  . omega-3 acid ethyl esters  1 g Oral BID  . raloxifene  60 mg Oral Daily  . sodium chloride  1,000 mL Intravenous Once  . sodium chloride  3 mL Intravenous Q12H  . sodium phosphate  2 enema Rectal Once  . DISCONTD: magnesium sulfate LVP 250-500 ml  2 g Intravenous Once  . DISCONTD: sodium chloride  1,000 mL Intravenous Once  Continuous Infusions:    . phenylephrine (NEO-SYNEPHRINE) Adult infusion 40 mcg/min (09/03/11 0900)  . DISCONTD: dextrose 5 % and 0.9 % NaCl with KCl 20 mEq/L 100 mL/hr (09/02/11 2329)  . DISCONTD: phenylephrine (NEO-SYNEPHRINE) Adult infusion 75 mcg/min (09/02/11 1919)

## 2011-09-03 NOTE — Op Note (Signed)
[facility] [address] [place], [place]  [address]  FLEXIBLE SIGMOIDOSCOPY PROCEDURE REPORT  PATIENT:  [name] [name], [name] [name]  MR#:  [id number] BIRTHDATE:  Mar 02, 1924, 87 yrs. old  GENDER:  female ENDOSCOPIST:  Jeani Hawking, MD PROCEDURE DATE:  09/03/2011 PROCEDURE:  Flexible Sigmoidoscopy with biopsy ASA CLASS:  Class III INDICATIONS:  abnormal imaging MEDICATIONS:  None  DESCRIPTION OF PROCEDURE:   After the risks benefits and alternatives of the procedure were thoroughly explained, informed consent was obtained.  Digital rectal exam was performed and revealed no abnormalities.   The  endoscope was introduced through the anus and advanced to the transverse colon, without limitations.  The quality of the prep was good.  The instrument was then slowly withdrawn as the mucosa was fully examined. <<PROCEDUREIMAGES>>  FINDINGS:  A mild colitis was identified in the mid transverse and proximal descending colon. Cold biospies were obtained. A few scattered sigmoid diverticula were identified. No other abnormalities noted.   Retroflexion was not performed.  The scope was then withdrawn from the patient and the procedure terminated.  COMPLICATIONS:  None  ENDOSCOPIC IMPRESSION: 1) Colitis 2) Diverticula RECOMMENDATIONS: 1) Continue with supportive care. 2) Await biopsy results.  ______________________________ Jeani Hawking, MD  n. Rosalie DoctorJeani Hawking at 09/03/2011 09:41 AM  Pall, Malena Catholic, [phone]

## 2011-09-03 NOTE — Progress Notes (Signed)
Name: [name] [name] MRN: [id number] DOB: February 01, 1924    LOS: 3  Referring Provider:  Dr. Benjamine Mola / [facility]  Reason for Referral:  Hypotension / Concern for Sepsis  PULMONARY / CRITICAL CARE MEDICINE  HPI:  78 y/o F with PMH of HTN, HLD, PUD, COPD admitted to [facility] on 6/27 with a 4 day history of feeling weak, watery diarrhea, nausea and vomiting.  Had been seen by PCP and prescribed phenergan.  She was found lethargic and hypotensive after Phenergan.  Work up in ED demonstrated nml wbc, hbg, normal lipase / lactic acid.  Mild renal insufficiency, metabolic acidosis.  CT scan demonstrated diffuse colonic wall thickening consistent with colitis, either infectious or inflammatory. No local abscess.  BP responded to volume resuscitation.  PCCM consulted am of 6/27 for mild hypotension and concern for further decompensation.       Events Since Admission: 6/28 Remains hypotensive with good mentation (unclear if BP's accurate), continues to have watery diarrhea   Current Status:    needing pressors, CVP 14, diarrhea +.  Ur OP 35cc/h   Vital Signs: Temp:  [97.7 F (36.5 C)-99.9 F (37.7 C)] 97.7 F (36.5 C) (06/29 0900) Pulse Rate:  [25-106] 99  (06/29 0900) Resp:  [11-25] 11  (06/29 0900) BP: (88-183)/(42-67) 183/59 mmHg (06/29 0837) SpO2:  [96 %-100 %] 99 % (06/29 0900) Arterial Line BP: (106-169)/(31-52) 169/52 mmHg (06/29 0900) Weight:  [94.9 kg (209 lb 3.5 oz)] 94.9 kg (209 lb 3.5 oz) (06/29 0439)  Physical Examination: General:  Chronically ill in NAD Neuro:  Awake/alert / oriented, speech clear, MAE - on 6/29 rounds sleeping  HEENT:  Mm pink/moist, short neck Cardiovascular:  s1s2 rrr, no m/r/g Lungs:  resp's even/non-labored, lungs bilaterally clear Abdomen:   Obese / soft, bsx4 active Musculoskeletal:  No deformities Skin:  Warm/dry, no edema  Principal Problem:  *Septic shock Active Problems:  Colitis presumed infectious  Acute renal failure  Hyperlipidemia   ASSESSMENT AND  PLAN  PULMONARY No results found for this basename: PHART:5,PCO2:5,PCO2ART:5,PO2ART:5,HCO3:5,O2SAT:5 in the last 168 hours Ventilator Settings:   CXR:  6/26 Cardiac enlargement without significant vascular congestion or edema. ETT:    A:   No acute process.  P:   -monitor / supportive care -pulmonary hygiene  CARDIOVASCULAR  Lab 09/02/11 0015 09/01/11 1620 08/31/11 2207  TROPONINI <0.30 -- <0.30  LATICACIDVEN 1.3 1.3 1.8  PROBNP -- -- --    Lab 09/03/11 0432 09/01/11 1620 08/31/11 2207  PROCALCITON 2.33 4.41 5.54     ECG:   Lines:   A:  Hypotension - initially mild improvement with IVF, now continues to have low bp and is in shock unclear if hypovolemic or septic Hx of HTN Hx of Hypercholesterolemia   -pn 6/29: pressor dependent despite CVP optimization. Cortisol 14.PCT high - all c.w septic shock  P:  -volume resuscitation for cvp > 12 - , pressors for map > 65 - start hydrocortison 6/29 x 5days - trend pct -abx as below -hold home HTN regimen -continue lipitor, welchol   RENAL  Lab 09/03/11 0432 09/02/11 0333 09/02/11 0015 09/01/11 0332 08/31/11 2207  NA 135 134* 133* 134* 133*  K 3.8 3.6 -- -- --  CL 117* 116* 111 108 102  CO2 13* 13* 13* 18* 16*  BUN 23 21 23 23  24*  CREATININE 2.20* 1.57* 1.52* 1.70* 1.67*  CALCIUM 7.6* 6.8* 7.4* 8.0* 9.2  MG 2.5 1.5 1.6 -- --  PHOS 2.3 -- -- -- --  Intake/Output      06/28 0701 - 06/29 0700 06/29 0701 - 06/30 0700   P.O. 240    I.V. (mL/kg) 3218.5 (33.9) 273.8 (2.9)   Other     IV Piggyback 860    Total Intake(mL/kg) 4318.5 (45.5) 273.8 (2.9)   Urine (mL/kg/hr) 755 (0.3) 100   Stool 875    Total Output 1630 100   Net +2688.5 +173.8         Foley:     A:   Acute renal failure - g   -on 6/29: ATN appears to be getting worse  P:   -trend sr cr -volume resuscitation with cvp and map goals -I/O's  GASTROINTESTINAL  Lab 08/31/11 2207 08/30/11 2059  AST 38* 18  ALT 16 13  ALKPHOS 52 50    BILITOT 1.0 0.7  PROT 6.6 5.9*  ALBUMIN 3.1* 3.1*    A:   Nausea / Vomiting CT concerning for Colitis Diarrhea  C diff negative    -on 6/29: still some diarrhea  P:   -See ID section -PPI  HEMATOLOGIC  Lab 09/03/11 0432 09/02/11 0333 09/01/11 0332 08/31/11 2207 08/30/11 2059  HGB 10.5* 9.9* 10.9* 12.1 10.8*  HCT 32.3* 30.3* 33.4* 37.1 32.2*  PLT 167 119* 136* 132* 127*  INR -- -- -- -- --  APTT -- -- -- -- --   A:   Mild Anemia Thrombocytopenia  P:  -no need for tx at this time -monitor h/h, platelets   - - PRBC for hgb </= 6.9gm%  As long as bp ok in gi bleed  - exceptions are   -  if ACS susepcted/confirmed then transfuse for hgb </= 8.0gm%,  or    -  If septic shock first 24h and scvo2 < 70% then transfuse for hgb </= 9.0gm%   - active bleeding with hemodynamic instability, then transfuse regardless of hemoglobin value   At at all times try to transfuse 1 unit prbc as possible with exception of active hemorrhage    INFECTIOUS  Lab 09/03/11 0432 09/02/11 0333 09/01/11 1620 09/01/11 0332 08/31/11 2207 08/30/11 2059  WBC 7.2 4.5 -- 4.3 4.1 10.4  PROCALCITON 2.33 -- 4.41 -- 5.54 --   Cultures: 6/27 MRSA PCR>>>neg 6/27 C-diff PCR>>>neg   Antibiotics: Flagyl 6/26>>> Cipro 6/26>>>  A:   Nausea / Vomiting Diarrhea Concern for Colitis r/o infectious process -cdiff negative  P:   --abx as above -volume resuscitation --f/u lactate, PCT   ENDOCRINE  Lab 09/02/11 1620  GLUCAP 100*   A:   No acute issues  P:   -monitor glucose on BMP  NEUROLOGIC  A:   No acute issues  P:   -supportive care    BEST PRACTICE / DISPOSITION Level of Care:  ICU Primary Service:  [facility] Consultants:  PCCM Code Status:  Full Diet:  Per Primary DVT Px:  SCD's GI Px:  None indicated Skin Integrity:  Intact  Social / Family:  None available     The patient is critically ill with multiple organ systems failure and requires high complexity decision making  for assessment and support, frequent evaluation and titration of therapies, application of advanced monitoring technologies and extensive interpretation of multiple databases.   Critical Care Time devoted to patient care services described in this note is  31  Minutes.  Dr. Kalman Shan, M.D., [facility].C.P Pulmonary and Critical Care Medicine Staff Physician [facility] Pager: [phone], If no answer  or between  15:00h - 7:00h: call [phone]  09/03/2011 9:39 AM

## 2011-09-03 NOTE — Interval H&P Note (Signed)
History and Physical Interval Note:  09/03/2011 8:15 AM  [name] [name]  has presented today for surgery, with the diagnosis of G I Bleeding  The various methods of treatment have been discussed with the patient and family. After consideration of risks, benefits and other options for treatment, the patient has consented to  Procedure(s) (LRB): FLEXIBLE SIGMOIDOSCOPY (N/A) as a surgical intervention .  The patient's history has been reviewed, patient examined, no change in status, stable for surgery.  I have reviewed the patients' chart and labs.  Questions were answered to the patient's satisfaction.     [name] D  Blood pressure much improved on Levophed.

## 2011-09-03 NOTE — Procedures (Signed)
Arterial Catheter Insertion Procedure Note [name] [name] [id number] [birth year]  Procedure: Insertion of Arterial Catheter  Indications: Blood pressure monitoring  Procedure Details Consent: Unable to obtain consent because of emergent medical necessity. Time Out: Verified patient identification, verified procedure, site/side was marked, verified correct patient position, special equipment/implants available, medications/allergies/relevent history reviewed, required imaging and test results available.  Performed  Maximum sterile technique was used including antiseptics, cap, gloves, gown, hand hygiene, mask and sheet. Skin prep: Chlorhexidine; local anesthetic administered 22 gauge catheter was inserted into right radial artery using the Seldinger technique.  Evaluation Blood flow good; BP tracing good. Complications: No apparent complications.   [name] [name] 09/03/2011

## 2011-09-04 DIAGNOSIS — K529 Noninfective gastroenteritis and colitis, unspecified: Secondary | ICD-10-CM

## 2011-09-04 DIAGNOSIS — I959 Hypotension, unspecified: Secondary | ICD-10-CM

## 2011-09-04 DIAGNOSIS — E782 Mixed hyperlipidemia: Secondary | ICD-10-CM

## 2011-09-04 DIAGNOSIS — E860 Dehydration: Secondary | ICD-10-CM

## 2011-09-04 LAB — BASIC METABOLIC PANEL
CO2: 13 mEq/L — ABNORMAL LOW (ref 19–32)
Calcium: 8.3 mg/dL — ABNORMAL LOW (ref 8.4–10.5)
Chloride: 116 mEq/L — ABNORMAL HIGH (ref 96–112)
GFR calc Af Amer: 16 mL/min — ABNORMAL LOW (ref >90)
Sodium: 137 mEq/L (ref 135–145)

## 2011-09-04 MED ORDER — PANTOPRAZOLE SODIUM 40 MG PO TBEC
40.0000 mg | DELAYED_RELEASE_TABLET | Freq: Every day | ORAL | Status: DC
  Administered 2011-09-04 – 2011-09-05 (×2): 40 mg via ORAL
  Filled 2011-09-04 (×4): qty 1

## 2011-09-04 MED ORDER — LEVALBUTEROL HCL 0.63 MG/3ML IN NEBU
0.6300 mg | INHALATION_SOLUTION | RESPIRATORY_TRACT | Status: DC | PRN
  Administered 2011-09-04: 0.63 mg via RESPIRATORY_TRACT
  Filled 2011-09-04: qty 3

## 2011-09-04 MED ORDER — LEVALBUTEROL HCL 0.63 MG/3ML IN NEBU
0.6300 mg | INHALATION_SOLUTION | Freq: Four times a day (QID) | RESPIRATORY_TRACT | Status: DC
  Administered 2011-09-04 – 2011-09-08 (×18): 0.63 mg via RESPIRATORY_TRACT
  Filled 2011-09-04 (×21): qty 3

## 2011-09-04 MED ORDER — FUROSEMIDE 20 MG PO TABS
20.0000 mg | ORAL_TABLET | Freq: Every day | ORAL | Status: DC
  Administered 2011-09-04 – 2011-09-05 (×2): 20 mg via ORAL
  Filled 2011-09-04 (×2): qty 1

## 2011-09-04 MED ORDER — METOPROLOL TARTRATE 12.5 MG HALF TABLET
12.5000 mg | ORAL_TABLET | Freq: Two times a day (BID) | ORAL | Status: DC
  Administered 2011-09-04 (×2): 12.5 mg via ORAL
  Filled 2011-09-04 (×4): qty 1

## 2011-09-04 NOTE — Progress Notes (Signed)
Name: [name] [name] MRN: [id number] DOB: 05-17-1923    LOS: 4  Referring Provider:  Dr. Benjamine Mola / [facility]  Reason for Referral:  Hypotension / Concern for Sepsis  PULMONARY / CRITICAL CARE MEDICINE  HPI:  78 y/o F with PMH of HTN, HLD, PUD, COPD admitted to [facility] on 6/27 with a 4 day history of feeling weak, watery diarrhea, nausea and vomiting.  Had been seen by PCP and prescribed phenergan.  She was found lethargic and hypotensive after Phenergan.  Work up in ED demonstrated nml wbc, hbg, normal lipase / lactic acid.  Mild renal insufficiency, metabolic acidosis.  CT scan demonstrated diffuse colonic wall thickening consistent with colitis, either infectious or inflammatory. No local abscess.  BP responded to volume resuscitation.  PCCM consulted am of 6/27 for mild hypotension and concern for further decompensation.       Events Since Admission: 6/28 Remains hypotensive with good mentation (unclear if BP's accurate), continues to have watery diarrhea 6/29 needing pressors, CVP 14, diarrhea +.  Ur OP 35cc/h   Current Status:     6/30 - off pressors, eating. GI show transverse and prox descending colon colitis.   RN says new A Fib since 6/27. Eating  Vital Signs: Temp:  [97.7 F (36.5 C)-99 F (37.2 C)] 98.4 F (36.9 C) (06/30 0800) Pulse Rate:  [53-120] 107  (06/30 0800) Resp:  [11-28] 22  (06/30 0800) BP: (90-143)/(34-96) 143/51 mmHg (06/30 0800) SpO2:  [94 %-100 %] 97 % (06/30 0800) Arterial Line BP: (102-174)/(45-97) 102/97 mmHg (06/29 1600) Weight:  [96.6 kg (212 lb 15.4 oz)] 96.6 kg (212 lb 15.4 oz) (06/30 0400)  Physical Examination: General:  Chronically ill in NAD Neuro:  Awake/alert / oriented, speech clear, HEENT:  Mm pink/moist, short neck Cardiovascular:  s1s2 rrr, no m/r/g Lungs:  resp's even/non-labored, lungs bilaterally clear Abdomen:   Obese / soft, bsx4 active Musculoskeletal:  No deformities Skin:  Warm/dry, no edema  Principal Problem:  *Septic shock Active  Problems:  Colitis presumed infectious  Acute renal failure  Hyperlipidemia   ASSESSMENT AND PLAN  PULMONARY No results found for this basename: PHART:5,PCO2:5,PCO2ART:5,PO2ART:5,HCO3:5,O2SAT:5 in the last 168 hours Ventilator Settings:   CXR:  6/26 Cardiac enlargement without significant vascular congestion or edema. ETT:    A:   No acute process.  P:   -monitor / supportive care -pulmonary hygiene  CARDIOVASCULAR  Lab 09/02/11 0015 09/01/11 1620 08/31/11 2207  TROPONINI <0.30 -- <0.30  LATICACIDVEN 1.3 1.3 1.8  PROBNP -- -- --    Lab 09/03/11 0432 09/01/11 1620 08/31/11 2207  PROCALCITON 2.33 4.41 5.54     ECG:   Lines:   A:  Hypotension - initially mild improvement with IVF, now continues to have low bp and is in shock unclear if hypovolemic or septic Hx of HTN Hx of Hypercholesterolemia   -pn 6/29: pressor dependent despite CVP optimization. Cortisol 14.PCT high - all c.w septic shock - on 6/30 - off pressors since hydrocort 6/29. EKG with new A Fib since 6/27  P:  - dc pressor orders  - dc hydrocort sarted 09/03/11 on 09/05/11 -dc cvp monitoring  -monitor MAP for goal > 65 -hold home HTN regimen -continue lipitor, welchol - [facility] to address A FIB  RENAL  Lab 09/03/11 0432 09/02/11 0333 09/02/11 0015 09/01/11 0332 08/31/11 2207  NA 135 134* 133* 134* 133*  K 3.8 3.6 -- -- --  CL 117* 116* 111 108 102  CO2 13* 13* 13* 18* 16*  BUN 23 21 23 23  24*  CREATININE 2.20* 1.57* 1.52* 1.70* 1.67*  CALCIUM 7.6* 6.8* 7.4* 8.0* 9.2  MG 2.5 1.5 1.6 -- --  PHOS 2.3 -- -- -- --   Intake/Output      06/29 0701 - 06/30 0700 06/30 0701 - 07/01 0700   P.O.     I.V. (mL/kg) 859.4 (8.9) 20 (0.2)   IV Piggyback 502    Total Intake(mL/kg) 1361.4 (14.1) 20 (0.2)   Urine (mL/kg/hr) 1160 (0.5) 60   Stool 0    Total Output 1160 60   Net +201.4 -40        Stool Occurrence      Foley:     A:   Acute renal failure -    -on 6/29: ATN appears to be getting  worse  P:   -trend sr cr; recheck 6/20 -volume resuscitation with cvp and map goals -I/O's  GASTROINTESTINAL  Lab 08/31/11 2207 08/30/11 2059  AST 38* 18  ALT 16 13  ALKPHOS 52 50  BILITOT 1.0 0.7  PROT 6.6 5.9*  ALBUMIN 3.1* 3.1*    A:   Nausea / Vomiting CT concerning for Colitis Diarrhea  C diff negative    -on 6/29: still some diarrhea. Scope showed transversed colon and prox descending colon colitis  - 6/30 - no more diarrhea  P:   -See ID section -PPI  HEMATOLOGIC  Lab 09/03/11 0432 09/02/11 0333 09/01/11 0332 08/31/11 2207 08/30/11 2059  HGB 10.5* 9.9* 10.9* 12.1 10.8*  HCT 32.3* 30.3* 33.4* 37.1 32.2*  PLT 167 119* 136* 132* 127*  INR -- -- -- -- --  APTT -- -- -- -- --   A:   Mild Anemia Thrombocytopenia - improved on 6/30  P:  -no need for tx at this time -monitor h/h, platelets   - - PRBC for hgb </= 6.9gm%  As long as bp ok in gi bleed  - exceptions are   -  if ACS susepcted/confirmed then transfuse for hgb </= 8.0gm%,  or    -  If septic shock first 24h and scvo2 < 70% then transfuse for hgb </= 9.0gm%   - active bleeding with hemodynamic instability, then transfuse regardless of hemoglobin value   At at all times try to transfuse 1 unit prbc as possible with exception of active hemorrhage    INFECTIOUS  Lab 09/03/11 0432 09/02/11 0333 09/01/11 1620 09/01/11 0332 08/31/11 2207 08/30/11 2059  WBC 7.2 4.5 -- 4.3 4.1 10.4  PROCALCITON 2.33 -- 4.41 -- 5.54 --   Cultures: 6/27 MRSA PCR>>>neg 6/27 C-diff PCR>>>neg   Antibiotics: Flagyl 6/26>>> Cipro 6/26>>>  A:   Nausea / Vomiting Diarrhea Concern for Colitis r/o infectious process -cdiff negative  P:   --abx as above -volume resuscitation --f/u lactate, PCT - [facility] to taper antibiptics   ENDOCRINE  Lab 09/02/11 1620  GLUCAP 100*   A:   No acute issues  P:   -monitor glucose on BMP  NEUROLOGIC  A:   No acute issues  P:   -supportive care    BEST PRACTICE /  DISPOSITION Level of Care:  ICU -> changed to SDU 09/04/11, OOB to chair Primary Service:  [facility] Consultants:  PCCM will sign off 09/04/11 Code Status:  Full Diet:  Per Primary DVT Px:  SCD's GI Px:  None indicated Skin Integrity:  Intact  Social / Family:  None available    Dr. Kalman Shan, M.D., [facility].C.P Pulmonary and Critical Care Medicine  Staff Physician [facility] [place] [facility] Pager: [phone], If no answer or between  15:00h - 7:00h: call [phone]  09/04/2011 8:58 AM

## 2011-09-04 NOTE — Progress Notes (Signed)
Subjective: No acute events.  No further diarrhea.  Objective: Vital signs in last 24 hours: Temp:  [97.7 F (36.5 C)-99 F (37.2 C)] 98.4 F (36.9 C) (06/30 0800) Pulse Rate:  [53-120] 107  (06/30 0800) Resp:  [11-28] 22  (06/30 0800) BP: (90-183)/(34-96) 143/51 mmHg (06/30 0800) SpO2:  [94 %-100 %] 97 % (06/30 0800) Arterial Line BP: (102-174)/(45-97) 102/97 mmHg (06/29 1600) Weight:  [96.6 kg (212 lb 15.4 oz)] 96.6 kg (212 lb 15.4 oz) (06/30 0400) Last BM Date: 09/03/11  Intake/Output from previous day: 06/29 0701 - 06/30 0700 In: 1361.4 [I.V.:859.4; IV Piggyback:502] Out: 1160 [Urine:1160] Intake/Output this shift: Total I/O In: 20 [I.V.:20] Out: 60 [Urine:60]  General appearance: alert and no distress GI: soft, non-tender; bowel sounds normal; no masses,  no organomegaly  Lab Results:  [facility] 09/03/11 0432 09/02/11 0333  WBC 7.2 4.5  HGB 10.5* 9.9*  HCT 32.3* 30.3*  PLT 167 119*   BMET  Basename 09/03/11 0432 09/02/11 0333 09/02/11 0015  NA 135 134* 133*  K 3.8 3.6 3.8  CL 117* 116* 111  CO2 13* 13* 13*  GLUCOSE 126* 156* 113*  BUN 23 21 23   CREATININE 2.20* 1.57* 1.52*  CALCIUM 7.6* 6.8* 7.4*   LFT No results found for this basename: PROT,ALBUMIN,AST,ALT,ALKPHOS,BILITOT,BILIDIR,IBILI in the last 72 hours PT/INR No results found for this basename: LABPROT:2,INR:2 in the last 72 hours Hepatitis Panel No results found for this basename: HEPBSAG,HCVAB,HEPAIGM,HEPBIGM in the last 72 hours C-Diff No results found for this basename: CDIFFTOX:3 in the last 72 hours Fecal Lactopherrin No results found for this basename: FECLLACTOFRN in the last 72 hours  Studies/Results: Dg Chest 1 View  09/03/2011  *RADIOLOGY REPORT*  Clinical Data: Shortness of breath, wheezing  CHEST - 1 VIEW  Comparison: 09/02/2011  Findings: Cardiomegaly.  Pulmonary vascular congestion without frank interstitial edema.  Mild bilateral lower lobe opacities, possibly atelectasis, left  lower lobe pneumonia not entirely excluded.  Stable right IJ venous catheter.  Surgical clips in the right neck.  IMPRESSION: Cardiomegaly.  Pulmonary vascular congestion without frank interstitial edema.  Mild bilateral lower lobe opacities, possibly atelectasis, left lower lobe pneumonia not entirely excluded.  Original Report Authenticated By: Charline Bills, M.D.   Dg Chest Port 1 View  09/02/2011  *RADIOLOGY REPORT*  Clinical Data: Central line placement.  PORTABLE CHEST - 1 VIEW  Comparison: 08/31/2011  Findings: Right central line tip at the cavoatrial junction.  No pneumothorax.  Cardiomegaly.  Left base atelectasis or infiltrate. Right lung is clear.  Heart is normal size.  No overt edema.  IMPRESSION: Right central line tip at the cavoatrial junction.  No pneumothorax.  Left lower lobe atelectasis or infiltrate.  Original Report Authenticated By: Cyndie Chime, M.D.    Medications:  Scheduled:   . antiseptic oral rinse  15 mL Mouth Rinse BID  . atorvastatin  40 mg Oral Daily  . ciprofloxacin  400 mg Intravenous Q24H  . colesevelam  1,875 mg Oral BID WC  . furosemide  20 mg Intravenous Once  . metronidazole  500 mg Intravenous Q8H  . multivitamin with minerals  1 tablet Oral Daily  . omega-3 acid ethyl esters  1 g Oral BID  . raloxifene  60 mg Oral Daily  . sodium chloride  1,000 mL Intravenous Once  . sodium chloride  3 mL Intravenous Q12H  . DISCONTD: ciprofloxacin  400 mg Intravenous BID  . DISCONTD: hydrocortisone sod succinate (SOLU-CORTEF) injection  50 mg Intravenous Q6H  Continuous:   . DISCONTD: dextrose 5 % and 0.9 % NaCl with KCl 20 mEq/L 100 mL/hr (09/02/11 2329)  . DISCONTD: phenylephrine (NEO-SYNEPHRINE) Adult infusion 30 mcg/min (09/03/11 1100)    Assessment/Plan: 1) Colitis of ? etiology. 2) Diarrhea - resolved at this time.   Since the FFS the patient's diarrhea has resolved.  Also she is off pressors and she was taken off of the medication during the  day yesterday.  Tolerating PO.  Plan: 1) Continue with supportive care. 2) Await biopsy results.  LOS: 4 days   [name] [name] D 09/04/2011, 8:33 AM

## 2011-09-04 NOTE — Progress Notes (Signed)
[facility] PROGRESS NOTE  [name] [name] WUJ:[id number] DOB: 1923-07-02 DOA: 08/31/2011 PCP: Henri Medal, MD  Assessment/Plan: 1. Diarrhea leading to sepsis/colitis- infectious vs inflammatory on CT scan, GI consulted- flex sig done, biopsy s taken, cipro/flagyl started, c-diff negtive and culture ordered- diarrhea resolved 2. Hypotension/septic shock- neo weaned off 3. A fib- daughter says patient has a history of this, start metoprolol 4. AKI- trend- consider renal if Cr not improving with lasix 5. Wheezing- lasix, nebs ordered change to ATC 6. Dehydration- received 4+L IVF 7. HLD 8. Increased CKs- IVF, recheck in AM 9. UTI- on IV cipro- e coli/enterobacter- sensitive to cipro   Code Status: full Family Communication: updated at bedside Disposition Plan: step down, PT consulted  [name], D.O.  [facility] Pager [phone]  09/04/2011, 9:31 AM  LOS: 4 days    Consultants:  GI  Critical care   Subjective: Patient eating a muffin.   SOB  Diarrhea resolved    Objective: Filed Vitals:   09/04/11 0600 09/04/11 0700 09/04/11 0800 09/04/11 0900  BP: 127/51 140/64 143/51 126/42  Pulse: 114 80 107 113  Temp: 98.6 F (37 C) 98.4 F (36.9 C) 98.4 F (36.9 C) 98.6 F (37 C)  TempSrc:   Core (Comment)   Resp: 19 19 22 19   Height:      Weight:      SpO2: 97% 97% 97% 98%    Intake/Output Summary (Last 24 hours) at 09/04/11 0931 Last data filed at 09/04/11 0900  Gross per 24 hour  Intake 1127.6 ml  Output   1120 ml  Net    7.6 ml    Exam:  GEN: awake, getting bath CHEST: B/L wheezing, coarse BS HEART: tachy rate and ir rhythm; no murmurs rubs or gallops  ABDOMEN: Obese, soft non-tender; no masses, no organomegaly, normal abdominal bowel sounds; no pannus; rectal foley inserted EXTREMITIES: No bone or joint deformity; age-appropriate arthropathy of the hands and knees; + edema; no ulcerations.  PULSES: 2+ and symmetric   SKIN:  no rash or ulceration  CNS: Cranial nerves 2-12 grossly intact no focal lateralizing neurologic deficit  Data Reviewed: Basic Metabolic Panel:  Lab 09/03/11 8657 09/02/11 0333 09/02/11 0015 09/01/11 0332 08/31/11 2207  NA 135 134* 133* 134* 133*  K 3.8 3.6 -- -- --  CL 117* 116* 111 108 102  CO2 13* 13* 13* 18* 16*  GLUCOSE 126* 156* 113* 111* 108*  BUN 23 21 23 23  24*  CREATININE 2.20* 1.57* 1.52* 1.70* 1.67*  CALCIUM 7.6* 6.8* 7.4* 8.0* 9.2  MG 2.5 1.5 1.6 -- --  PHOS 2.3 -- -- -- --   Liver Function Tests:  Lab 08/31/11 2207 08/30/11 2059  AST 38* 18  ALT 16 13  ALKPHOS 52 50  BILITOT 1.0 0.7  PROT 6.6 5.9*  ALBUMIN 3.1* 3.1*    Lab 08/31/11 2207 08/30/11 2059  LIPASE 15 32  AMYLASE -- --   No results found for this basename: AMMONIA:5 in the last 168 hours CBC:  Lab 09/03/11 0432 09/02/11 0333 09/01/11 0332 08/31/11 2207 08/30/11 2059  WBC 7.2 4.5 4.3 4.1 10.4  NEUTROABS -- -- -- 3.3 9.5*  HGB 10.5* 9.9* 10.9* 12.1 10.8*  HCT 32.3* 30.3* 33.4* 37.1 32.2*  MCV 94.7 97.1 96.5 94.9 94.4  PLT 167 119* 136* 132* 127*   Cardiac Enzymes:  Lab 09/02/11 0015 08/31/11 2207  CKTOTAL 1745* --  CKMB 10.8* --  CKMBINDEX -- --  TROPONINI <0.30 <  0.30   BNP: No components found with this basename: POCBNP:5 CBG:  Lab 09/02/11 1620  GLUCAP 100*    Recent Results (from the past 240 hour(s))  URINE CULTURE     Status: Normal   Collection Time   08/30/11  9:53 PM      Component Value Range Status Comment   Specimen Description URINE, CATHETERIZED   Final    Special Requests NONE   Final    Culture  Setup Time 08/31/2011 02:49   Final    Colony Count >=100,000 COLONIES/ML   Final    Culture     Final    Value: ENTEROBACTER AEROGENES     ESCHERICHIA COLI   Report Status 09/03/2011 FINAL   Final    Organism ID, Bacteria ENTEROBACTER AEROGENES   Final    Organism ID, Bacteria ESCHERICHIA COLI   Final   URINE CULTURE     Status: Normal   Collection Time    08/31/11 10:37 PM      Component Value Range Status Comment   Specimen Description URINE, CATHETERIZED   Final    Special Requests NONE   Final    Culture  Setup Time 09/01/2011 04:03   Final    Colony Count >=100,000 COLONIES/ML   Final    Culture ENTEROBACTER AEROGENES   Final    Report Status 09/03/2011 FINAL   Final    Organism ID, Bacteria ENTEROBACTER AEROGENES   Final   CULTURE, BLOOD (ROUTINE X 2)     Status: Normal (Preliminary result)   Collection Time   09/01/11 12:10 AM      Component Value Range Status Comment   Specimen Description BLOOD LEFT ANTECUBITAL   Final    Special Requests BOTTLES DRAWN AEROBIC AND ANAEROBIC 5CC   Final    Culture  Setup Time [id number]   Final    Culture     Final    Value:        BLOOD CULTURE RECEIVED NO GROWTH TO DATE CULTURE WILL BE HELD FOR 5 DAYS BEFORE ISSUING A FINAL NEGATIVE REPORT   Report Status PENDING   Incomplete   CULTURE, BLOOD (ROUTINE X 2)     Status: Normal (Preliminary result)   Collection Time   09/01/11 12:15 AM      Component Value Range Status Comment   Specimen Description BLOOD R FOOT   Final    Special Requests BOTTLES DRAWN AEROBIC AND ANAEROBIC   Final    Culture  Setup Time [id number]   Final    Culture     Final    Value:        BLOOD CULTURE RECEIVED NO GROWTH TO DATE CULTURE WILL BE HELD FOR 5 DAYS BEFORE ISSUING A FINAL NEGATIVE REPORT   Report Status PENDING   Incomplete   STOOL CULTURE     Status: Normal (Preliminary result)   Collection Time   09/01/11  1:48 AM      Component Value Range Status Comment   Specimen Description STOOL   Final    Special Requests NONE   Final    Culture Culture reincubated for better growth   Final    Report Status PENDING   Incomplete   CLOSTRIDIUM DIFFICILE BY PCR     Status: Normal   Collection Time   09/01/11  1:49 AM      Component Value Range Status Comment   C difficile by pcr NEGATIVE  NEGATIVE Final   MRSA PCR  SCREENING     Status: Normal   Collection Time     09/01/11  1:49 AM      Component Value Range Status Comment   MRSA by PCR NEGATIVE  NEGATIVE Final      Studies: Ct Abdomen Pelvis W Contrast  08/31/2011  *RADIOLOGY REPORT*  Clinical Data: Lethargy, nausea, vomiting, diarrhea, and hypotension.  CT ABDOMEN AND PELVIS WITH CONTRAST  Technique:  Multidetector CT imaging of the abdomen and pelvis was performed following the standard protocol during bolus administration of intravenous contrast.  Contrast: OMNIPAQUE IOHEXOL 300 MG/ML  SOLN  Comparison: None.  Findings: Mild dependent atelectasis in the lung bases.  Probable cardiac enlargement.  The liver, spleen, gallbladder, pancreas, adrenal glands, kidneys, and retroperitoneal lymph nodes are unremarkable.  Diffuse calcification and tortuosity of the aorta without aneurysm.  Flow is demonstrated in the mesenteric and portal vessels.  There is a small amount of fluid around the posterior liver edge on the right. The stomach and small bowel are decompressed.  Prominent visceral adipose tissues.  No free fluid or free air in the abdomen.  The colon is not distended but is fluid-filled consistent with liquid stool.  There is suggestion of mild diffuse colonic wall thickening suggesting edema or colitis.  Infectious or inflammatory colitis could have this appearance.  Pelvis:  The uterus is anteverted.  Uterus and adnexal structures are not enlarged.  Minimal free fluid in the pelvis.  Foley catheter decompresses the bladder.  No loculated pelvic fluid collections.  Diverticula in the sigmoid colon without diverticulitis.  The appendix is normal.  Extensive degenerative changes throughout the lumbar spine with mild lumbar scoliosis. Mild anterior subluxation of L4 on L5.  IMPRESSION: Mild diffuse wall thickening and liquid stool in the colon suggesting infectious or inflammatory process.  Minimal fluid around the right liver edge and in the pelvis.  No evidence of focal mass or abscess.  Original Report  Authenticated By: Marlon Pel, M.D.   Dg Chest Port 1 View  08/31/2011  *RADIOLOGY REPORT*  Clinical Data: Cough.  Shortness of breath and weakness.  PORTABLE CHEST - 1 VIEW  Comparison: 02/04/2009  Findings: Shallow inspiration.  Cardiac enlargement with normal pulmonary vascularity.  No focal airspace consolidation or edema. No blunting of costophrenic angles.  No pneumothorax.  Surgical clips in the base of the neck.  Calcification of the aorta.  No significant change since previous study, allowing for technical differences.  IMPRESSION: Cardiac enlargement without significant vascular congestion or edema.  Original Report Authenticated By: Marlon Pel, M.D.    Scheduled Meds:    . antiseptic oral rinse  15 mL Mouth Rinse BID  . atorvastatin  40 mg Oral Daily  . ciprofloxacin  400 mg Intravenous Q24H  . colesevelam  1,875 mg Oral BID WC  . furosemide  20 mg Intravenous Once  . furosemide  20 mg Oral Daily  . levalbuterol  0.63 mg Nebulization Q6H  . metoprolol tartrate  12.5 mg Oral BID  . metronidazole  500 mg Intravenous Q8H  . multivitamin with minerals  1 tablet Oral Daily  . omega-3 acid ethyl esters  1 g Oral BID  . pantoprazole  40 mg Oral Q1200  . raloxifene  60 mg Oral Daily  . sodium chloride  1,000 mL Intravenous Once  . sodium chloride  3 mL Intravenous Q12H  . DISCONTD: ciprofloxacin  400 mg Intravenous BID  . DISCONTD: hydrocortisone sod succinate (SOLU-CORTEF) injection  50 mg  Intravenous Q6H   Continuous Infusions:    . DISCONTD: dextrose 5 % and 0.9 % NaCl with KCl 20 mEq/L 100 mL/hr (09/02/11 2329)  . DISCONTD: phenylephrine (NEO-SYNEPHRINE) Adult infusion 30 mcg/min (09/03/11 1100)

## 2011-09-04 NOTE — Progress Notes (Signed)
Patient's stool positive for Salmonella per [employer].  MD notified.  Infection prevention notified as well, and they will notify the Health Dept.  Patient has had no diarrhea since yesterday morning prior to flex sig procedure.  Instructed that patient does not need special precautions at this time, universal precautions continued.  Will continue to monitor.

## 2011-09-04 NOTE — Progress Notes (Signed)
CRITICAL VALUE ALERT  Critical value received:  Stool positive for Salmonella  Date of notification:  09/04/11  Time of notification:  1730  Critical value read back: yes  Nurse who received alert:  [place] Ouch  MD notified (1st page):  Dr. Benjamine Mola  Time of first page:  1745  MD notified (2nd page):  Time of second page:  Responding MD:  Dr. Benjamine Mola  Time MD responded:  [phone]

## 2011-09-04 NOTE — Evaluation (Signed)
Physical Therapy Evaluation Patient Details Name: [name] [name] MRN: [id number] DOB: 1923-03-21 Today's Date: 09/04/2011 Time: 4401-0272 PT Time Calculation (min): 29 min  PT Assessment / Plan / Recommendation Clinical Impression  pt presents with Sepsis.  pt globally deconditioned and DOE.  pt was very independent PTA per RN.  pt would benefit from CIR at D/C to maximize Independence prior to D/C home.      PT Assessment  Patient needs continued PT services    Follow Up Recommendations  Inpatient Rehab    Barriers to Discharge None      Equipment Recommendations  Defer to next venue    Recommendations for Other Services OT consult;Rehab consult   Frequency Min 3X/week    Precautions / Restrictions Precautions Precautions: Fall (HR) Restrictions Weight Bearing Restrictions: No   Pertinent Vitals/Pain No C/O pain.        Mobility  Bed Mobility Bed Mobility: Supine to Sit;Sitting - Scoot to Edge of Bed Supine to Sit: 1: +2 Total assist Supine to Sit: Patient Percentage: 30% Sitting - Scoot to Edge of Bed: 1: +2 Total assist Sitting - Scoot to Edge of Bed: Patient Percentage: 10% Details for Bed Mobility Assistance: cues for safe technique, step-by-step through mobility, cues to attend to task.   Transfers Transfers: Sit to Stand;Stand to Sit;Stand Pivot Transfers Sit to Stand: 1: +2 Total assist;With upper extremity assist;From bed Sit to Stand: Patient Percentage: 50% Stand to Sit: 1: +2 Total assist;With upper extremity assist;With armrests;To chair/3-in-1 Stand to Sit: Patient Percentage: 60% Stand Pivot Transfers: 1: +2 Total assist Stand Pivot Transfers: Patient Percentage: 60% Details for Transfer Assistance: cues for step-by-step through transfer, attending to task, use of RW, A to move RW.  pt's HR increased to 130's nonsustained during transfer.  pt fatigues quickly.   Ambulation/Gait Ambulation/Gait Assistance: Not tested (comment) Stairs: No Wheelchair  Mobility Wheelchair Mobility: No    Exercises     PT Diagnosis: Difficulty walking  PT Problem List: Decreased strength;Decreased activity tolerance;Decreased balance;Decreased mobility;Decreased cognition;Decreased knowledge of use of DME;Decreased safety awareness;Cardiopulmonary status limiting activity PT Treatment Interventions: DME instruction;Gait training;Stair training;Functional mobility training;Therapeutic activities;Therapeutic exercise;Balance training;Neuromuscular re-education;Cognitive remediation;Patient/family education   PT Goals Acute Rehab PT Goals PT Goal Formulation: Patient unable to participate in goal setting Time For Goal Achievement: 09/18/11 Potential to Achieve Goals: Good Pt will go Supine/Side to Sit: Independently PT Goal: Supine/Side to Sit - Progress: Goal set today Pt will go Sit to Supine/Side: Independently PT Goal: Sit to Supine/Side - Progress: Goal set today Pt will go Sit to Stand: with modified independence PT Goal: Sit to Stand - Progress: Goal set today Pt will go Stand to Sit: with modified independence PT Goal: Stand to Sit - Progress: Goal set today Pt will Ambulate: >150 feet;with modified independence;with least restrictive assistive device PT Goal: Ambulate - Progress: Goal set today  Visit Information  Last PT Received On: 09/04/11 Assistance Needed: +2    Subjective Data  Subjective: pt with minimal verbalizations. Patient Stated Goal: None Stated.     Prior Functioning  Home Living Lives With: Alone Available Help at Discharge: Family (Potentially near 24hr.  ) Type of Home:  (pt just moving into townhome close to Dtr's home.  ) Additional Comments: Unclear home set-up as pt needs max cueing to respond to questions and at times inconsistent responses.   Prior Function Level of Independence: Independent Able to Take Stairs?: Yes Driving: Yes Vocation: Retired Comments: Per [employer] fund[employer] pt was very Independent  and walks at the  [place] regularly.   Communication Communication: No difficulties    Cognition  Overall Cognitive Status: Impaired Area of Impairment: Attention;Memory;Following commands;Safety/judgement;Awareness of deficits;Problem solving Arousal/Alertness:  (Flat Affect, Drowsy) Orientation Level: Disoriented to;Place;Time;Situation Behavior During Session: Flat affect Current Attention Level: Sustained Memory Deficits: pt stating "I don't know" to some home questions and orientation questions. Following Commands: Follows one step commands inconsistently Safety/Judgement: Decreased safety judgement for tasks assessed;Decreased awareness of need for assistance Problem Solving: pt slow to process and requires frequent cues to attend to task.      Extremity/Trunk Assessment Right Lower Extremity Assessment RLE ROM/Strength/Tone: Deficits;Unable to fully assess;Due to impaired cognition RLE ROM/Strength/Tone Deficits: AROM appears grossly WFL, strength unable to assess secondary to attention.   RLE Sensation: WFL - Light Touch Left Lower Extremity Assessment LLE ROM/Strength/Tone: Deficits;Unable to fully assess;Due to impaired cognition LLE ROM/Strength/Tone Deficits: AROM appears grossly WFL, however unable to assess strength secondary to decreased attention.   LLE Sensation: WFL - Light Touch   Balance Balance Balance Assessed: No  End of Session PT - End of Session Equipment Utilized During Treatment: Gait belt;Oxygen Activity Tolerance: Patient limited by fatigue (Limited by cognition.  ) Patient left: in chair;with call bell/phone within reach Nurse Communication: Mobility status  GP     Sunny Schlein, [place] [phone] 09/04/2011, 2:51 PM

## 2011-09-05 ENCOUNTER — Inpatient Hospital Stay (HOSPITAL_COMMUNITY): Payer: Medicare Other

## 2011-09-05 ENCOUNTER — Encounter (HOSPITAL_COMMUNITY): Payer: Self-pay | Admitting: Gastroenterology

## 2011-09-05 DIAGNOSIS — A4189 Other specified sepsis: Secondary | ICD-10-CM

## 2011-09-05 DIAGNOSIS — A020 Salmonella enteritis: Secondary | ICD-10-CM | POA: Diagnosis present

## 2011-09-05 DIAGNOSIS — R5381 Other malaise: Secondary | ICD-10-CM

## 2011-09-05 LAB — URINALYSIS, ROUTINE W REFLEX MICROSCOPIC
Glucose, UA: NEGATIVE mg/dL
Protein, ur: 30 mg/dL — AB
Specific Gravity, Urine: 1.017 (ref 1.005–1.030)
pH: 5 (ref 5.0–8.0)

## 2011-09-05 LAB — BASIC METABOLIC PANEL
CO2: 14 mEq/L — ABNORMAL LOW (ref 19–32)
Chloride: 117 mEq/L — ABNORMAL HIGH (ref 96–112)
Creatinine, Ser: 3.25 mg/dL — ABNORMAL HIGH (ref 0.50–1.10)
GFR calc Af Amer: 14 mL/min — ABNORMAL LOW (ref >90)
Potassium: 3.7 mEq/L (ref 3.5–5.1)
Sodium: 137 mEq/L (ref 135–145)

## 2011-09-05 LAB — CBC
MCV: 94.3 fL (ref 78.0–100.0)
Platelets: 172 10*3/uL (ref 150–400)
RBC: 3.35 MIL/uL — ABNORMAL LOW (ref 3.87–5.11)
RDW: 15 % (ref 11.5–15.5)
WBC: 11.5 10*3/uL — ABNORMAL HIGH (ref 4.0–10.5)

## 2011-09-05 LAB — URINE MICROSCOPIC-ADD ON

## 2011-09-05 LAB — CK: Total CK: 160 U/L (ref 7–177)

## 2011-09-05 MED ORDER — ASPIRIN 325 MG PO TABS
325.0000 mg | ORAL_TABLET | Freq: Every day | ORAL | Status: DC
  Administered 2011-09-05 – 2011-09-15 (×11): 325 mg via ORAL
  Filled 2011-09-05 (×11): qty 1

## 2011-09-05 MED ORDER — FUROSEMIDE 10 MG/ML IJ SOLN
80.0000 mg | Freq: Three times a day (TID) | INTRAMUSCULAR | Status: DC
  Administered 2011-09-05 – 2011-09-07 (×4): 80 mg via INTRAVENOUS
  Filled 2011-09-05 (×8): qty 8

## 2011-09-05 MED ORDER — METOPROLOL TARTRATE 1 MG/ML IV SOLN
5.0000 mg | Freq: Four times a day (QID) | INTRAVENOUS | Status: DC | PRN
  Administered 2011-09-07: 5 mg via INTRAVENOUS
  Filled 2011-09-05: qty 5

## 2011-09-05 MED ORDER — METOPROLOL TARTRATE 25 MG PO TABS
25.0000 mg | ORAL_TABLET | Freq: Two times a day (BID) | ORAL | Status: DC
  Administered 2011-09-05: 25 mg via ORAL
  Filled 2011-09-05 (×2): qty 1

## 2011-09-05 MED ORDER — METOPROLOL TARTRATE 50 MG PO TABS
50.0000 mg | ORAL_TABLET | Freq: Two times a day (BID) | ORAL | Status: DC
  Administered 2011-09-05 – 2011-09-15 (×19): 50 mg via ORAL
  Filled 2011-09-05 (×21): qty 1

## 2011-09-05 NOTE — Progress Notes (Addendum)
Name: [name] [name] MRN: [id number] DOB: 1924-01-12    LOS: 5  Referring Provider:  Dr. Benjamine Mola / [facility]  Reason for Referral:  Hypotension / Concern for Sepsis  PULMONARY / CRITICAL CARE MEDICINE  HPI:   78 yo female admitted on 08/31/2011 with weakness, waterry diarrhea, nausea/vomiting, lethargy, hypotension.  Found to have diffuse colitis on CT abd/pelvis.  PCCM consulted 6/27 due to hypotension and concern for sepsis.  Stool reported to be positive for Salmonella.  Also with Enterobacter in urine. PMH of HTN, HLD, PUD, COPD     Events Since Admission: 6/28 Remains hypotensive with good mentation (unclear if BP's accurate), continues to have watery diarrhea 6/29 needing pressors, CVP 14, diarrhea +.  Ur OP 35cc/h  Current Status:   Sleepy, but arousable.  Has some wheezing.  Denies chest pain, abdominal pain.  No further episodes of diarrhea.  Vital Signs: Temp:  [98.2 F (36.8 C)-99 F (37.2 C)] 98.6 F (37 C) (07/01 1000) Pulse Rate:  [68-131] 78  (07/01 1000) Resp:  [17-27] 19  (07/01 1000) BP: (75-151)/(39-97) 124/63 mmHg (07/01 1000) SpO2:  [96 %-100 %] 97 % (07/01 1000) Weight:  [214 lb 4.6 oz (97.2 kg)] 214 lb 4.6 oz (97.2 kg) (07/01 0000)   Intake/Output Summary (Last 24 hours) at 09/05/11 1024 Last data filed at 09/05/11 1000  Gross per 24 hour  Intake   1323 ml  Output    745 ml  Net    578 ml    Physical Examination: General: Ill appearing Neuro: Somnolent, easily arousable, follows commands HEENT:  No sinus tenderness Cardiovascular:  s1s2 regular, no murmur Lungs:  Faint wheeze over lung bases, and anterior neck Abdomen:  Soft, nontender Musculoskeletal:  1+ edema Skin: no rashes  Dg Chest 1 View  09/03/2011  *RADIOLOGY REPORT*  Clinical Data: Shortness of breath, wheezing  CHEST - 1 VIEW  Comparison: 09/02/2011  Findings: Cardiomegaly.  Pulmonary vascular congestion without frank interstitial edema.  Mild bilateral lower lobe opacities, possibly  atelectasis, left lower lobe pneumonia not entirely excluded.  Stable right IJ venous catheter.  Surgical clips in the right neck.  IMPRESSION: Cardiomegaly.  Pulmonary vascular congestion without frank interstitial edema.  Mild bilateral lower lobe opacities, possibly atelectasis, left lower lobe pneumonia not entirely excluded.  Original Report Authenticated By: Charline Bills, M.D.     ASSESSMENT AND PLAN  PULMONARY   A:  Mild dyspnea 7/01 likely from volume overload.  Hx of COPD. P:   Agree with diuresis per primary team Continue xopenex Titrate oxygen to keep SpO2 > 92%  CARDIOVASCULAR  Lines: Rt IJ CVL 6/28>>  A: Hypotension 2nd to diarrhea, and volume depletion>>resolved.  A fib with RVR.  Hx of HTN, hypercholesterolemia P:  Per primary team   RENAL  Lab 09/05/11 0518 09/04/11 1039 09/03/11 0432 09/02/11 0333 09/02/11 0015  NA 137 137 135 134* 133*  K 3.7 4.2 -- -- --  CL 117* 116* 117* 116* 111  CO2 14* 13* 13* 13* 13*  BUN 38* 32* 23 21 23   CREATININE 3.25* 2.86* 2.20* 1.57* 1.52*  CALCIUM 8.6 8.3* 7.6* 6.8* 7.4*  MG -- -- 2.5 1.5 1.6  PHOS -- -- 2.3 -- --    A:  Acute renal failure 2nd to volume depletion. P:   Monitor renal function while getting lasix   GASTROINTESTINAL  Lab 08/31/11 2207 08/30/11 2059  AST 38* 18  ALT 16 13  ALKPHOS 52 50  BILITOT 1.0 0.7  PROT 6.6 5.9*  ALBUMIN 3.1* 3.1*    A:  Colitis 2nd to Salmonella P:   GI signed off 7/01  HEMATOLOGIC  Lab 09/05/11 0518 09/03/11 0432 09/02/11 0333 09/01/11 0332 08/31/11 2207  HGB 10.5* 10.5* 9.9* 10.9* 12.1  HCT 31.6* 32.3* 30.3* 33.4* 37.1  PLT 172 167 119* 136* 132*  INR -- -- -- -- --  APTT -- -- -- -- --   A:  Mild Anemia.  Thrombocytopenia - improved on 6/30 P:  F/u CBC   INFECTIOUS  Lab 09/05/11 0518 09/03/11 0432 09/02/11 0333 09/01/11 1620 09/01/11 0332 08/31/11 2207  WBC 11.5* 7.2 4.5 -- 4.3 4.1  PROCALCITON -- 2.33 -- 4.41 -- 5.54   Cultures: 6/27 MRSA  PCR>>>neg 6/27 C-diff PCR>>>neg 6/27 Blood>>> 6/27 Urine>>>Enterobacter 6/27 Stool>>>Salmonella  Antibiotics: Flagyl 6/26>>> Cipro 6/26>>>  A:  Salmonella colitis.  Enterobacter UTI.  Both sensitive to cipro. P:   Abx per primary team  ENDOCRINE  Lab 09/02/11 1620  GLUCAP 100*   A:  No acute issues P:   -monitor glucose on BMP  NEUROLOGIC  A:  No acute issues P:   -supportive care    BEST PRACTICE / DISPOSITION  DVT Px:  SCD's GI Px:  Protonix  PCCM will sign off.  Please call if additional help needed.  Coralyn Helling, MD [facility] Pulmonary/Critical Care 09/05/2011, 10:20 AM Pager:  [phone] After 3pm call: [phone]

## 2011-09-05 NOTE — Progress Notes (Signed)
Subjective: I hurt all.  No diarrhea.  Objective: Vital signs in last 24 hours: Temp:  [98.2 F (36.8 C)-99 F (37.2 C)] 98.6 F (37 C) (07/01 0800) Pulse Rate:  [68-131] 115  (07/01 0800) Resp:  [17-27] 21  (07/01 0800) BP: (75-151)/(39-97) 127/48 mmHg (07/01 0800) SpO2:  [96 %-100 %] 98 % (07/01 0800) Weight:  [97.2 kg (214 lb 4.6 oz)] 97.2 kg (214 lb 4.6 oz) (07/01 0000) Last BM Date: 09/03/11  Intake/Output from previous day: 06/30 0701 - 07/01 0700 In: 1243 [P.O.:300; I.V.:443; IV Piggyback:500] Out: 645 [Urine:645] Intake/Output this shift:    General appearance: moderate distress GI: soft, non-tender; bowel sounds normal; no masses,  no organomegaly  Lab Results:  Basename 09/05/11 0518 09/03/11 0432  WBC 11.5* 7.2  HGB 10.5* 10.5*  HCT 31.6* 32.3*  PLT 172 167   BMET  Basename 09/05/11 0518 09/04/11 1039 09/03/11 0432  NA 137 137 135  K 3.7 4.2 3.8  CL 117* 116* 117*  CO2 14* 13* 13*  GLUCOSE 120* 159* 126*  BUN 38* 32* 23  CREATININE 3.25* 2.86* 2.20*  CALCIUM 8.6 8.3* 7.6*   LFT No results found for this basename: PROT,ALBUMIN,AST,ALT,ALKPHOS,BILITOT,BILIDIR,IBILI in the last 72 hours PT/INR No results found for this basename: LABPROT:2,INR:2 in the last 72 hours Hepatitis Panel No results found for this basename: HEPBSAG,HCVAB,HEPAIGM,HEPBIGM in the last 72 hours C-Diff No results found for this basename: CDIFFTOX:3 in the last 72 hours Fecal Lactopherrin No results found for this basename: FECLLACTOFRN in the last 72 hours  Studies/Results: Dg Chest 1 View  09/03/2011  *RADIOLOGY REPORT*  Clinical Data: Shortness of breath, wheezing  CHEST - 1 VIEW  Comparison: 09/02/2011  Findings: Cardiomegaly.  Pulmonary vascular congestion without frank interstitial edema.  Mild bilateral lower lobe opacities, possibly atelectasis, left lower lobe pneumonia not entirely excluded.  Stable right IJ venous catheter.  Surgical clips in the right neck.   IMPRESSION: Cardiomegaly.  Pulmonary vascular congestion without frank interstitial edema.  Mild bilateral lower lobe opacities, possibly atelectasis, left lower lobe pneumonia not entirely excluded.  Original Report Authenticated By: Charline Bills, M.D.    Medications:  Scheduled:   . antiseptic oral rinse  15 mL Mouth Rinse BID  . aspirin  325 mg Oral Daily  . atorvastatin  40 mg Oral Daily  . ciprofloxacin  400 mg Intravenous Q24H  . colesevelam  1,875 mg Oral BID WC  . furosemide  20 mg Oral Daily  . levalbuterol  0.63 mg Nebulization Q6H  . metoprolol tartrate  25 mg Oral BID  . metronidazole  500 mg Intravenous Q8H  . multivitamin with minerals  1 tablet Oral Daily  . omega-3 acid ethyl esters  1 g Oral BID  . pantoprazole  40 mg Oral Q1200  . raloxifene  60 mg Oral Daily  . sodium chloride  1,000 mL Intravenous Once  . sodium chloride  3 mL Intravenous Q12H  . DISCONTD: metoprolol tartrate  12.5 mg Oral BID   Continuous:   Assessment/Plan: 1) Salmonella colitis.   Dr. Benjamine Mola relayed to me that she has salmonella colitis.  At this time, since her diarrhea has stopped and she appears stable from the GI standpoint antibiotics can be stopped.  At least Flagyl can be stopped as it is not effective for Salmonella.  Plan: 1) Continue supportive care. 2) Signing off.  LOS: 5 days   Jaydis Duchene D 09/05/2011, 8:17 AM

## 2011-09-05 NOTE — Consult Note (Signed)
Physical Medicine and Rehabilitation Consult Reason for Consult: Deconditioning/septic shock Referring Physician: Dr. [name]   HPI: [name] [name] is a 78 y.o. right-handed female with history of hypertension and COPD. Admitted 09/01/2011 with persistent diarrhea, fever and hypotension. Workup revealed mildly elevated creatinine 1.67 and urinalysis with WBCs 3-6. Abdominal pelvic CT scan showed diffuse colonic wall thickening consistent with colitis. She was placed on intravenous fluids. Gastroenterology consulted Dr. Elnoria Howard. Underwent flexible sigmoidoscopy 09/03/2011 showing mild colitis in the mid transverse and proximal descending colon and advised conservative care. C. difficile specimen negative. Blood pressures have responded nicely to intravenous fluids. Close monitoring of renal function elevated to 3.25 with followup per critical care medicine. Physical therapy evaluation completed 09/04/2011 with recommendations for physical medicine rehabilitation consult due to deconditioning to consider inpatient rehabilitation services   Review of Systems  Constitutional: Positive for malaise/fatigue.  Gastrointestinal: Positive for diarrhea.  Neurological: Positive for weakness.  All other systems reviewed and are negative.   Past Medical History  Diagnosis Date  . Hypertension   . Hyperlipidemia   . COPD (chronic obstructive pulmonary disease)   . Osteoporosis   . Peptic ulcer disease   . History of kidney stones   . Arthritis   . H/O hiatal hernia   . Chronic back pain    Past Surgical History  Procedure Date  . Carotid endarterectomy     bilateral  . Cataract extraction, bilateral   . Joint replacement 2008    Right Total Knee  . Anal fistula repair   . Left parotidectomy   . Eye surgery   . Flexible sigmoidoscopy 09/02/2011    Procedure: FLEXIBLE SIGMOIDOSCOPY;  Surgeon: Theda Belfast, MD;  Location: [facility];  Service: Endoscopy;  Laterality: N/A;   History  reviewed. No pertinent family history. Social History:  reports that she quit smoking about 28 years ago. Her smoking use included Cigarettes. She has a 20 pack-year smoking history. She has never used smokeless tobacco. She reports that she does not drink alcohol or use illicit drugs. Allergies:  Allergies  Allergen Reactions  . Demerol (Meperidine) Hives  . Penicillins Hives   Medications Prior to Admission  Medication Sig Dispense Refill  . allopurinol (ZYLOPRIM) 100 MG tablet Take 100 mg by mouth 2 (two) times daily.      Marland Kitchen atenolol (TENORMIN) 50 MG tablet Take 50 mg by mouth daily.      Marland Kitchen atorvastatin (LIPITOR) 40 MG tablet Take 40 mg by mouth daily.      . colesevelam (WELCHOL) 625 MG tablet Take 1,875 mg by mouth 2 (two) times daily with a meal.      . diphenoxylate-atropine (LOMOTIL) 2.5-0.025 MG per tablet Take 1 tablet by mouth 4 (four) times daily as needed for diarrhea or loose stools.  30 tablet  0  . furosemide (LASIX) 40 MG tablet Take 40 mg by mouth daily.      . Multiple Vitamin (MULTIVITAMIN WITH MINERALS) TABS Take 1 tablet by mouth daily.      Marland Kitchen olmesartan (BENICAR) 20 MG tablet Take 20 mg by mouth daily.      Marland Kitchen omega-3 acid ethyl esters (LOVAZA) 1 G capsule Take 1 g by mouth 2 (two) times daily.      . ondansetron (ZOFRAN) 4 MG tablet Take 1 tablet (4 mg total) by mouth every 6 (six) hours.  12 tablet  0  . raloxifene (EVISTA) 60 MG tablet Take 60 mg by mouth daily.  Home: Home Living Lives With: Alone Available Help at Discharge: Family (Potentially near 24hr.  ) Type of Home:  (pt just moving into townhome close to Dtr's home.  ) Additional Comments: Unclear home set-up as pt needs max cueing to respond to questions and at times inconsistent responses.    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   Functional Status:   Mobility: Bed Mobility Bed Mobility: Supine to Sit;Sitting - Scoot to Edge of Bed Supine to Sit: 1: +2 Total assist Supine to Sit: Patient Percentage: 30% Sitting - Scoot to Edge of Bed: 1: +2 Total assist Sitting - Scoot to Edge of Bed: Patient Percentage: 10% Transfers Transfers: Sit to Stand;Stand to [employer] Transfers Sit to Stand: 1: +2 Total assist;With upper extremity assist;From bed Sit to Stand: Patient Percentage: 50% Stand to Sit: 1: +2 Total assist;With upper extremity assist;With armrests;To chair/3-in-1 Stand to Sit: Patient Percentage: 60% Stand Pivot Transfers: 1: +2 Total assist Stand Pivot Transfers: Patient Percentage: 60% Ambulation/Gait Ambulation/Gait Assistance: Not tested (comment) Stairs: No Wheelchair Mobility Wheelchair Mobility: No  ADL:    Cognition: Cognition Arousal/Alertness:  (Flat Affect, Drowsy) Orientation Level: Oriented to person;Oriented to place;Oriented to time Cognition Overall Cognitive Status: Impaired Area of Impairment: Attention;Memory;Following commands;Safety/judgement;Awareness of deficits;Problem solving Arousal/Alertness:  (Flat Affect, Drowsy) Orientation Level: Disoriented to;Place;Time;Situation Behavior During Session: Flat affect Current Attention Level: Sustained Memory Deficits: pt stating "I don't know" to some home questions and orientation questions. Following Commands: Follows one step commands inconsistently Safety/Judgement: Decreased safety judgement for tasks assessed;Decreased awareness of need for assistance Problem Solving: pt slow to process and requires frequent cues to attend to task.    Blood pressure 113/39, pulse 118, temperature 98.6 F (37 C), temperature source Core (Comment), resp. rate 21, height 5' (1.524 m), weight 97.2 kg (214 lb 4.6 oz), SpO2 96.00%. Physical Exam  Vitals reviewed. Constitutional:       Frail-appearing elderly lady.  HENT:  Head: Normocephalic.  Neck: Neck  supple. No thyromegaly present.  Cardiovascular: Normal rate and regular rhythm.   Pulmonary/Chest: Breath sounds normal. She has no wheezes.  Abdominal: Bowel sounds are normal. She exhibits no distension. There is no tenderness.  Musculoskeletal: She exhibits no edema.  Neurological:  Reflex Scores:      Tricep reflexes are 1+ on the right side and 1+ on the left side.      Bicep reflexes are 1+ on the right side.      Brachioradialis reflexes are 1+ on the right side and 1+ on the left side.      Patellar reflexes are 1+ on the right side and 1+ on the left side.      Achilles reflexes are 1+ on the right side and 1+ on the left side.      Patient is alert and appears to be somewhat hard of hearing. She is appropriate for stating her age but with some delayed response. She needed some cueing for her living situation. She could not name the year without cues. She followed simple commands. Moves all 4 limbs grossly 3/5 proximally to 3/5 distally. No gross sensory deficits. Phonation.  Skin: Skin is warm and dry.    Results for orders placed during the hospital encounter of 08/31/11 (from the past 24 hour(s))  BASIC METABOLIC PANEL     Status: Abnormal   Collection Time   09/04/11 10:39  AM      Component Value Range   Sodium 137  135 - 145 mEq/L   Potassium 4.2  3.5 - 5.1 mEq/L   Chloride 116 (*) 96 - 112 mEq/L   CO2 13 (*) 19 - 32 mEq/L   Glucose, Bld 159 (*) 70 - 99 mg/dL   BUN 32 (*) 6 - 23 mg/dL   Creatinine, Ser 4.09 (*) 0.50 - 1.10 mg/dL   Calcium 8.3 (*) 8.4 - 10.5 mg/dL   GFR calc non Af Amer 14 (*) >90 mL/min   GFR calc Af Amer 16 (*) >90 mL/min  CBC     Status: Abnormal   Collection Time   09/05/11  5:18 AM      Component Value Range   WBC 11.5 (*) 4.0 - 10.5 K/uL   RBC 3.35 (*) 3.87 - 5.11 MIL/uL   Hemoglobin 10.5 (*) 12.0 - 15.0 g/dL   HCT 81.1 (*) 91.4 - 78.2 %   MCV 94.3  78.0 - 100.0 fL   MCH 31.3  26.0 - 34.0 pg   MCHC 33.2  30.0 - 36.0 g/dL   RDW 95.6  21.3 -  08.6 %   Platelets 172  150 - 400 K/uL  BASIC METABOLIC PANEL     Status: Abnormal   Collection Time   09/05/11  5:18 AM      Component Value Range   Sodium 137  135 - 145 mEq/L   Potassium 3.7  3.5 - 5.1 mEq/L   Chloride 117 (*) 96 - 112 mEq/L   CO2 14 (*) 19 - 32 mEq/L   Glucose, Bld 120 (*) 70 - 99 mg/dL   BUN 38 (*) 6 - 23 mg/dL   Creatinine, Ser 5.78 (*) 0.50 - 1.10 mg/dL   Calcium 8.6  8.4 - 46.9 mg/dL   GFR calc non Af Amer 12 (*) >90 mL/min   GFR calc Af Amer 14 (*) >90 mL/min  CK     Status: Normal   Collection Time   09/05/11  5:18 AM      Component Value Range   Total CK 160  7 - 177 U/L   Dg Chest 1 View  09/03/2011  *RADIOLOGY REPORT*  Clinical Data: Shortness of breath, wheezing  CHEST - 1 VIEW  Comparison: 09/02/2011  Findings: Cardiomegaly.  Pulmonary vascular congestion without frank interstitial edema.  Mild bilateral lower lobe opacities, possibly atelectasis, left lower lobe pneumonia not entirely excluded.  Stable right IJ venous catheter.  Surgical clips in the right neck.  IMPRESSION: Cardiomegaly.  Pulmonary vascular congestion without frank interstitial edema.  Mild bilateral lower lobe opacities, possibly atelectasis, left lower lobe pneumonia not entirely excluded.  Original Report Authenticated By: Charline Bills, M.D.    Assessment/Plan: Diagnosis: Colitis/sepsis/deconditioning 1. Does the need for close, 24 hr/day medical supervision in concert with the patient's rehab needs make it unreasonable for this patient to be served in a less intensive setting? Potentially 2. Co-Morbidities requiring supervision/potential complications: Hypotension, acute renal failure 3. Due to bladder management, bowel management, safety, skin/wound care, disease management, medication administration, pain management and patient education, does the patient require 24 hr/day rehab nursing? Potentially 4. Does the patient require coordinated care of a physician, rehab nurse, PT (1-2  hrs/day, 5 days/week) and OT (1-2 hrs/day, 5 days/week) to address physical and functional deficits in the context of the above medical diagnosis(es)? Potentially Addressing deficits in the following areas: balance, endurance, locomotion, strength, transferring, bowel/bladder control, bathing, dressing, feeding,  grooming and psychosocial support 5. Can the patient actively participate in an intensive therapy program of at least 3 hrs of therapy per day at least 5 days per week? Potentially 6. The potential for patient to make measurable gains while on inpatient rehab is good and fair 7. Anticipated functional outcomes upon discharge from inpatient rehab are supervision to minimal assistance with PT, supervision to moderate assist with OT, . 8. Estimated rehab length of stay to reach the above functional goals is: To be determined 9. Does the patient have adequate social supports to accommodate these discharge functional goals? No 10. Anticipated D/C setting: SNF 11. Anticipated post D/C treatments: HH therapy 12. Overall Rehab/Functional Prognosis: fair  RECOMMENDATIONS: This patient's condition is appropriate for continued rehabilitative care in the following setting: SNF Patient has agreed to participate in recommended program. Potentially Note that insurance prior authorization may be required for reimbursement for recommended care.  Comment: All of her functional and medical gains. Given her social support system and current physical level I don't see inpatient rehabilitation being a good fit, but I will rule it out.  Ranelle Oyster M.D.  09/05/2011

## 2011-09-05 NOTE — Progress Notes (Addendum)
[facility] PROGRESS NOTE  Annamary Buschman Matthew NWG:[id number] DOB: 07/28/1923 DOA: 08/31/2011 PCP: Henri Medal, MD   Assessment/Plan: 1. Diarrhea leading to sepsis/colitis found to be secondary to salmonella- GI consulted- flex sig done, biopsy s taken- pending, cipro/flagyl started, c-diff negtive  2. Hypotension/septic shock- neo weaned off 3. A fib- daughter says patient has a history of this, start metoprolol for rate control, and increase, and an ASA 4. AKI- prob ATN (hypotension) trend- consult renal, renal u/s ordered, UOP good 5. Wheezing- lasix, nebs ordered change to ATC- improved- check am chst xray 6. Dehydration secondary to diarrhea- received 4+L IVF 7. HLD 8. Increased CKs- IVF, recheck in AM- resolved 9. UTI- on IV cipro- e coli/enterobacter- sensitive to cipro day #5-d/c recheck U/a 10. Generalized weakness- PT- consult CIR  Code Status: full Family Communication: updated at bedside-daughter Disposition Plan: step down, PT consulted- recommend CIR   Patient is very sick  Still- poor overall prognosis   [name], D.O.  [facility] Pager [phone]  09/05/2011, 8:00 AM  LOS: 5 days    Consultants:  GI  Critical care   Subjective:  Diarrhea resolved Still with occasional SOB     Objective: Filed Vitals:   09/05/11 0407 09/05/11 0500 09/05/11 0600 09/05/11 0700  BP: 126/48 135/46 134/54 113/39  Pulse: 118 131 112 118  Temp: 98.8 F (37.1 C) 98.8 F (37.1 C) 98.6 F (37 C) 98.6 F (37 C)  TempSrc:    Core (Comment)  Resp: 23 21 20 21   Height:      Weight:      SpO2: 97% 97% 97% 96%    Intake/Output Summary (Last 24 hours) at 09/05/11 0800 Last data filed at 09/05/11 0700  Gross per 24 hour  Intake   1223 ml  Output    585 ml  Net    638 ml    Exam:  GEN: awake, ill appearing CHEST: B/L wheezing, coarse BS HEART: tachy rate and ir rhythm; no murmurs rubs or gallops  ABDOMEN: Obese, soft  non-tender; no masses, no organomegaly, normal abdominal bowel sounds; no pannus EXTREMITIES: No bone or joint deformity; age-appropriate arthropathy of the hands and knees; + edema hands > legs; no ulcerations.  PULSES: 2+ and symmetric  SKIN:  no rash or ulceration  CNS: Cranial nerves 2-12 grossly intact no focal lateralizing neurologic deficit- but generalized weakness  Data Reviewed: Basic Metabolic Panel:  Lab 09/05/11 2952 09/04/11 1039 09/03/11 0432 09/02/11 0333 09/02/11 0015  NA 137 137 135 134* 133*  K 3.7 4.2 -- -- --  CL 117* 116* 117* 116* 111  CO2 14* 13* 13* 13* 13*  GLUCOSE 120* 159* 126* 156* 113*  BUN 38* 32* 23 21 23   CREATININE 3.25* 2.86* 2.20* 1.57* 1.52*  CALCIUM 8.6 8.3* 7.6* 6.8* 7.4*  MG -- -- 2.5 1.5 1.6  PHOS -- -- 2.3 -- --   Liver Function Tests:  Lab 08/31/11 2207 08/30/11 2059  AST 38* 18  ALT 16 13  ALKPHOS 52 50  BILITOT 1.0 0.7  PROT 6.6 5.9*  ALBUMIN 3.1* 3.1*    Lab 08/31/11 2207 08/30/11 2059  LIPASE 15 32  AMYLASE -- --   No results found for this basename: AMMONIA:5 in the last 168 hours CBC:  Lab 09/05/11 0518 09/03/11 0432 09/02/11 0333 09/01/11 0332 08/31/11 2207 08/30/11 2059  WBC 11.5* 7.2 4.5 4.3 4.1 --  NEUTROABS -- -- -- -- 3.3 9.5*  HGB 10.5* 10.5* 9.9*  10.9* 12.1 --  HCT 31.6* 32.3* 30.3* 33.4* 37.1 --  MCV 94.3 94.7 97.1 96.5 94.9 --  PLT 172 167 119* 136* 132* --   Cardiac Enzymes:  Lab 09/05/11 0518 09/02/11 0015 08/31/11 2207  CKTOTAL 160 1745* --  CKMB -- 10.8* --  CKMBINDEX -- -- --  TROPONINI -- <0.30 <0.30   BNP: No components found with this basename: POCBNP:5 CBG:  Lab 09/02/11 1620  GLUCAP 100*    Recent Results (from the past 240 hour(s))  URINE CULTURE     Status: Normal   Collection Time   08/30/11  9:53 PM      Component Value Range Status Comment   Specimen Description URINE, CATHETERIZED   Final    Special Requests NONE   Final    Culture  Setup Time 08/31/2011 02:49   Final     Colony Count >=100,000 COLONIES/ML   Final    Culture     Final    Value: ENTEROBACTER AEROGENES     ESCHERICHIA COLI   Report Status 09/03/2011 FINAL   Final    Organism ID, Bacteria ENTEROBACTER AEROGENES   Final    Organism ID, Bacteria ESCHERICHIA COLI   Final   URINE CULTURE     Status: Normal   Collection Time   08/31/11 10:37 PM      Component Value Range Status Comment   Specimen Description URINE, CATHETERIZED   Final    Special Requests NONE   Final    Culture  Setup Time 09/01/2011 04:03   Final    Colony Count >=100,000 COLONIES/ML   Final    Culture ENTEROBACTER AEROGENES   Final    Report Status 09/03/2011 FINAL   Final    Organism ID, Bacteria ENTEROBACTER AEROGENES   Final   CULTURE, BLOOD (ROUTINE X 2)     Status: Normal (Preliminary result)   Collection Time   09/01/11 12:10 AM      Component Value Range Status Comment   Specimen Description BLOOD LEFT ANTECUBITAL   Final    Special Requests BOTTLES DRAWN AEROBIC AND ANAEROBIC 5CC   Final    Culture  Setup Time [id number]   Final    Culture     Final    Value:        BLOOD CULTURE RECEIVED NO GROWTH TO DATE CULTURE WILL BE HELD FOR 5 DAYS BEFORE ISSUING A FINAL NEGATIVE REPORT   Report Status PENDING   Incomplete   CULTURE, BLOOD (ROUTINE X 2)     Status: Normal (Preliminary result)   Collection Time   09/01/11 12:15 AM      Component Value Range Status Comment   Specimen Description BLOOD R FOOT   Final    Special Requests BOTTLES DRAWN AEROBIC AND ANAEROBIC   Final    Culture  Setup Time [id number]   Final    Culture     Final    Value:        BLOOD CULTURE RECEIVED NO GROWTH TO DATE CULTURE WILL BE HELD FOR 5 DAYS BEFORE ISSUING A FINAL NEGATIVE REPORT   Report Status PENDING   Incomplete   STOOL CULTURE     Status: Normal (Preliminary result)   Collection Time   09/01/11  1:48 AM      Component Value Range Status Comment   Specimen Description STOOL   Final    Special Requests NONE   Final     Culture Culture reincubated for better  growth   Final    Report Status PENDING   Incomplete   CLOSTRIDIUM DIFFICILE BY PCR     Status: Normal   Collection Time   09/01/11  1:49 AM      Component Value Range Status Comment   C difficile by pcr NEGATIVE  NEGATIVE Final   MRSA PCR SCREENING     Status: Normal   Collection Time   09/01/11  1:49 AM      Component Value Range Status Comment   MRSA by PCR NEGATIVE  NEGATIVE Final      Studies: Ct Abdomen Pelvis W Contrast  08/31/2011  *RADIOLOGY REPORT*  Clinical Data: Lethargy, nausea, vomiting, diarrhea, and hypotension.  CT ABDOMEN AND PELVIS WITH CONTRAST  Technique:  Multidetector CT imaging of the abdomen and pelvis was performed following the standard protocol during bolus administration of intravenous contrast.  Contrast: OMNIPAQUE IOHEXOL 300 MG/ML  SOLN  Comparison: None.  Findings: Mild dependent atelectasis in the lung bases.  Probable cardiac enlargement.  The liver, spleen, gallbladder, pancreas, adrenal glands, kidneys, and retroperitoneal lymph nodes are unremarkable.  Diffuse calcification and tortuosity of the aorta without aneurysm.  Flow is demonstrated in the mesenteric and portal vessels.  There is a small amount of fluid around the posterior liver edge on the right. The stomach and small bowel are decompressed.  Prominent visceral adipose tissues.  No free fluid or free air in the abdomen.  The colon is not distended but is fluid-filled consistent with liquid stool.  There is suggestion of mild diffuse colonic wall thickening suggesting edema or colitis.  Infectious or inflammatory colitis could have this appearance.  Pelvis:  The uterus is anteverted.  Uterus and adnexal structures are not enlarged.  Minimal free fluid in the pelvis.  Foley catheter decompresses the bladder.  No loculated pelvic fluid collections.  Diverticula in the sigmoid colon without diverticulitis.  The appendix is normal.  Extensive degenerative changes  throughout the lumbar spine with mild lumbar scoliosis. Mild anterior subluxation of L4 on L5.  IMPRESSION: Mild diffuse wall thickening and liquid stool in the colon suggesting infectious or inflammatory process.  Minimal fluid around the right liver edge and in the pelvis.  No evidence of focal mass or abscess.  Original Report Authenticated By: Marlon Pel, M.D.   Dg Chest Port 1 View  08/31/2011  *RADIOLOGY REPORT*  Clinical Data: Cough.  Shortness of breath and weakness.  PORTABLE CHEST - 1 VIEW  Comparison: 02/04/2009  Findings: Shallow inspiration.  Cardiac enlargement with normal pulmonary vascularity.  No focal airspace consolidation or edema. No blunting of costophrenic angles.  No pneumothorax.  Surgical clips in the base of the neck.  Calcification of the aorta.  No significant change since previous study, allowing for technical differences.  IMPRESSION: Cardiac enlargement without significant vascular congestion or edema.  Original Report Authenticated By: Marlon Pel, M.D.    Scheduled Meds:    . antiseptic oral rinse  15 mL Mouth Rinse BID  . aspirin  325 mg Oral Daily  . atorvastatin  40 mg Oral Daily  . ciprofloxacin  400 mg Intravenous Q24H  . colesevelam  1,875 mg Oral BID WC  . furosemide  20 mg Oral Daily  . levalbuterol  0.63 mg Nebulization Q6H  . metoprolol tartrate  25 mg Oral BID  . metronidazole  500 mg Intravenous Q8H  . multivitamin with minerals  1 tablet Oral Daily  . omega-3 acid ethyl esters  1  g Oral BID  . pantoprazole  40 mg Oral Q1200  . raloxifene  60 mg Oral Daily  . sodium chloride  1,000 mL Intravenous Once  . sodium chloride  3 mL Intravenous Q12H  . DISCONTD: metoprolol tartrate  12.5 mg Oral BID   Continuous Infusions:

## 2011-09-05 NOTE — Consult Note (Signed)
[name] [name] 09/05/2011 [name] [name] D Requesting Physician:  Dr. [name]  Reason for Consult:  AKI HPI: The patient is a 78 y.o. year-old w hx of HTN, HL, COPD, PUD, ASCVD w bilat CEA presented 09/01/11 with watery diarrhea, gen weakness and hypotension. BP was 60-70's systolic in ED.  Urine was cloudy. CT of abdomen showed diffuse colonic wall thickening c/w colitis. Pt was admitted and treated w empiric IV cipro and flagyl. Creatinine was 0.99 on admission 6/25 , but rose to 1.70 on on 6/26. Then down to 1.57 on 6/27, and up to 2.2 yest and 3.25 today.  She did receive IV contrast on 6/26, 100 mL x 1.  BCx;s have been negative, stool + for salmonella, urine + for enterobacter and stool neg for Cdif.     Patient is lethargic and provides no history. Daughter says she has been getting worse the last 2-3 days, increasing lethargy, can't cough up her phlegm, weak.  Not eating much. Weight is up 85 > 97 kg (= 12kg increase). UOP [phone] cc/d on 20 iv or po lasix the last 3 days.   She was on an ARB (olmesartan) at home but this was stopped on admission.  No ACEI or NSAID's.    Date  Creat 2009  0.7-1.5 2010  1.2-1.3 Admit 6/26 0.99 >> 3.25 today (7/1)  UA- 100 prot, large hgb, 3-6wbc, 0-2rbc, many bact UCx 6/26  + enterobacter aerogenes Stool cx 6/27 + salmonella species Blood cx's 6/27 neg x 2 Stool Cdif (-) CXR 6/26, 6/27 w CM only. 6/29 cxr w new vasc congestion and LLL opacity ECHO- normal EF  AbxMeds Cipro 6/27 > 7/1 stopped Flagyl 6/27 > 7/1 stopped Protonix 6/30 > current Omnipaque (iohexol) 6/26 x 1   Creatinine, Ser  Date/Time Value Range Status  09/05/2011  5:18 AM 3.25* 0.50 - 1.10 mg/dL Final  06/13/8117 14:78 AM 2.86* 0.50 - 1.10 mg/dL Final  2/95/6213  0:86 AM 2.20* 0.50 - 1.10 mg/dL Final  5/78/4696  2:95 AM 1.57* 0.50 - 1.10 mg/dL Final  2/84/1324 40:10 AM 1.52* 0.50 - 1.10 mg/dL Final  2/72/5366  4:40 AM 1.70* 0.50 - 1.10 mg/dL Final  3/47/4259 56:38  PM 1.67* 0.50 - 1.10 mg/dL Final     REPEATED TO VERIFY     DELTA CHECK NOTED     RESULT CHECKED  08/30/2011  8:59 PM 0.99  0.50 - 1.10 mg/dL Final  75/08/4330  9:51 AM 1.24* 0.4 - 1.2 mg/dL Final  88/06/1658 63:01 PM 1.30* 0.4 - 1.2 mg/dL Final  60/03/930  3:55 AM 1.3* 0.4 - 1.2 mg/dL Final  7/32/2025 42:70 PM 1.5*  Final  04/10/2007  4:25 AM 0.76   Final  04/03/2007  5:27 PM 1.09   Final    Past Medical History:  Past Medical History  Diagnosis Date  . Hypertension   . Hyperlipidemia   . COPD (chronic obstructive pulmonary disease)   . Osteoporosis   . Peptic ulcer disease   . History of kidney stones   . Arthritis   . H/O hiatal hernia   . Chronic back pain     Past Surgical History:  Past Surgical History  Procedure Date  . Carotid endarterectomy     bilateral  . Cataract extraction, bilateral   . Joint replacement 2008    Right Total Knee  . Anal fistula repair   . Left parotidectomy   . Eye surgery   . Flexible sigmoidoscopy 09/02/2011  Procedure: FLEXIBLE SIGMOIDOSCOPY;  Surgeon: Theda Belfast, MD;  Location: [facility];  Service: Endoscopy;  Laterality: N/A;  . Flexible sigmoidoscopy 09/03/2011    Procedure: FLEXIBLE SIGMOIDOSCOPY;  Surgeon: Theda Belfast, MD;  Location: [facility];  Service: Endoscopy;  Laterality: N/A;    Family History: History reviewed. No pertinent family history. Social History:  reports that she quit smoking about 28 years ago. Her smoking use included Cigarettes. She has a 20 pack-year smoking history. She has never used smokeless tobacco. She reports that she does not drink alcohol or use illicit drugs.  Allergies:  Allergies  Allergen Reactions  . Demerol (Meperidine) Hives  . Penicillins Hives    Home medications: Prior to Admission medications   Medication Sig Start Date End Date Taking? Authorizing Provider  allopurinol (ZYLOPRIM) 100 MG tablet Take 100 mg by mouth 2 (two) times daily.   Yes Historical Provider, MD    atenolol (TENORMIN) 50 MG tablet Take 50 mg by mouth daily.   Yes Historical Provider, MD  atorvastatin (LIPITOR) 40 MG tablet Take 40 mg by mouth daily.   Yes Historical Provider, MD  colesevelam (WELCHOL) 625 MG tablet Take 1,875 mg by mouth 2 (two) times daily with a meal.   Yes Historical Provider, MD  diphenoxylate-atropine (LOMOTIL) 2.5-0.025 MG per tablet Take 1 tablet by mouth 4 (four) times daily as needed for diarrhea or loose stools. 08/30/11 09/09/11 Yes Toy Baker, MD  furosemide (LASIX) 40 MG tablet Take 40 mg by mouth daily.   Yes Historical Provider, MD  Multiple Vitamin (MULTIVITAMIN WITH MINERALS) TABS Take 1 tablet by mouth daily.   Yes Historical Provider, MD  olmesartan (BENICAR) 20 MG tablet Take 20 mg by mouth daily.   Yes Historical Provider, MD  omega-3 acid ethyl esters (LOVAZA) 1 G capsule Take 1 g by mouth 2 (two) times daily.   Yes Historical Provider, MD  ondansetron (ZOFRAN) 4 MG tablet Take 1 tablet (4 mg total) by mouth every 6 (six) hours. 08/30/11 09/06/11 Yes Toy Baker, MD  raloxifene (EVISTA) 60 MG tablet Take 60 mg by mouth daily.   Yes Historical Provider, MD    Inpatient medications:    . antiseptic oral rinse  15 mL Mouth Rinse BID  . aspirin  325 mg Oral Daily  . atorvastatin  40 mg Oral Daily  . colesevelam  1,875 mg Oral BID WC  . furosemide  20 mg Oral Daily  . levalbuterol  0.63 mg Nebulization Q6H  . metoprolol tartrate  50 mg Oral BID  . multivitamin with minerals  1 tablet Oral Daily  . omega-3 acid ethyl esters  1 g Oral BID  . pantoprazole  40 mg Oral Q1200  . raloxifene  60 mg Oral Daily  . sodium chloride  1,000 mL Intravenous Once  . sodium chloride  3 mL Intravenous Q12H  . DISCONTD: ciprofloxacin  400 mg Intravenous Q24H  . DISCONTD: metoprolol tartrate  12.5 mg Oral BID  . DISCONTD: metoprolol tartrate  25 mg Oral BID  . DISCONTD: metronidazole  500 mg Intravenous Q8H    Review of Systems Not possible due to  AMS  Labs: Basic Metabolic Panel:  Lab 09/05/11 1610 09/04/11 1039 09/03/11 0432 09/02/11 0333 09/02/11 0015 09/01/11 0332 08/31/11 2207  NA 137 137 135 134* 133* 134* 133*  K 3.7 4.2 3.8 3.6 3.8 4.1 3.9  CL 117* 116* 117* 116* 111 108 102  CO2 14* 13* 13* 13* 13* 18* 16*  GLUCOSE 120* 159* 126* 156* 113* 111* 108*  BUN 38* 32* 23 21 23 23  24*  CREATININE 3.25* 2.86* 2.20* 1.57* 1.52* 1.70* 1.67*  ALB -- -- -- -- -- -- --  CALCIUM 8.6 8.3* 7.6* 6.8* 7.4* 8.0* 9.2  PHOS -- -- 2.3 -- -- -- --   Liver Function Tests:  Lab 08/31/11 2207 08/30/11 2059  AST 38* 18  ALT 16 13  ALKPHOS 52 50  BILITOT 1.0 0.7  PROT 6.6 5.9*  ALBUMIN 3.1* 3.1*    Lab 08/31/11 2207 08/30/11 2059  LIPASE 15 32  AMYLASE -- --   No results found for this basename: AMMONIA:3 in the last 168 hours CBC:  Lab 09/05/11 0518 09/03/11 0432 09/02/11 0333 09/01/11 0332 08/31/11 2207 08/30/11 2059  WBC 11.5* 7.2 4.5 4.3 -- --  NEUTROABS -- -- -- -- 3.3 9.5*  HGB 10.5* 10.5* 9.9* 10.9* -- --  HCT 31.6* 32.3* 30.3* 33.4* -- --  MCV 94.3 94.7 97.1 96.5 -- --  PLT 172 167 119* 136* -- --   PT/INR: @labrcntip (inr:5) Cardiac Enzymes:  Lab 09/05/11 0518 09/02/11 0015 08/31/11 2207  CKTOTAL 160 1745* --  CKMB -- 10.8* --  CKMBINDEX -- -- --  TROPONINI -- <0.30 <0.30   CBG:  Lab 09/02/11 1620  GLUCAP 100*    Iron Studies: No results found for this basename: IRON:30,TIBC:30,TRANSFERRIN:30,FERRITIN:30 in the last 168 hours  Xrays/Other Studies: No results found.  Physical Exam:  Blood pressure 140/48, pulse 110, temperature 98.8 F (37.1 C), temperature source Core (Comment), resp. rate 18, height 5' (1.524 m), weight 97.2 kg (214 lb 4.6 oz), SpO2 97.00%.  Gen: good color, lethargic [name], arouseable, +occ gurgling lung sounds grossly heard Skin: no rash, cyanosis HEENT:  EOMI, sclera anicteric, throat clear Neck: + JVD, no bruits or LAN Chest: faint bibasilar crackles Heart: regular, no rub  or gallop, no murmur Abdomen: soft, obese, nontender, no HSM, no ascites Ext: 2-3+ pitting edema of LE's and UE's bilat Neuro: does not answer questions well, no focal deficit, no asterixis,  Heme/Lymph: as above  Impression 1. Acute kidney injury in patient with diarrhea and salmonella enterocolitis - due to ATN from IV contrast exposure on 6/26. Creatinine is rising, may need RRT if not improving soon. Another possibility is AIN due to meds (cipro, PPI). Less likely.  No acute indication for HD.  2. Volume - vol overloaded w diffuse edema, will start tid IV lasix at 80mg . Up 12kg by weight. Suspect she already has early pulm edema.  3. AMS - could be early uremia. Not getting any narcotics for pain 4. HTN- olmesartan 20/d, atenolo 50/d and lasix 40/d at home.  All of these are on hold currently. 5. Hx COPD 6. Hx HL- on statin and Welchol  Thank you, will follow.   Vinson Moselle  MD [facility] [phone] pgr    [phone] cell 09/05/2011, 5:49 PM

## 2011-09-06 ENCOUNTER — Inpatient Hospital Stay (HOSPITAL_COMMUNITY): Payer: Medicare Other

## 2011-09-06 DIAGNOSIS — R5383 Other fatigue: Secondary | ICD-10-CM

## 2011-09-06 LAB — CBC
HCT: 31.3 % — ABNORMAL LOW (ref 36.0–46.0)
Hemoglobin: 10.4 g/dL — ABNORMAL LOW (ref 12.0–15.0)
RBC: 3.38 MIL/uL — ABNORMAL LOW (ref 3.87–5.11)
WBC: 9.4 10*3/uL (ref 4.0–10.5)

## 2011-09-06 LAB — RENAL FUNCTION PANEL
BUN: 45 mg/dL — ABNORMAL HIGH (ref 6–23)
Calcium: 8.7 mg/dL (ref 8.4–10.5)
Chloride: 114 mEq/L — ABNORMAL HIGH (ref 96–112)
Glucose, Bld: 97 mg/dL (ref 70–99)
Phosphorus: 4.6 mg/dL (ref 2.3–4.6)
Sodium: 138 mEq/L (ref 135–145)

## 2011-09-06 LAB — BLOOD GAS, ARTERIAL
Bicarbonate: 14.4 mEq/L — ABNORMAL LOW (ref 20.0–24.0)
TCO2: 13.8 mmol/L (ref 0–100)
pCO2 arterial: 35.7 mmHg (ref 35.0–45.0)
pH, Arterial: 7.229 — ABNORMAL LOW (ref 7.350–7.400)

## 2011-09-06 LAB — CK: Total CK: 70 U/L (ref 7–177)

## 2011-09-06 LAB — BASIC METABOLIC PANEL
Chloride: 112 mEq/L (ref 96–112)
Creatinine, Ser: 3.41 mg/dL — ABNORMAL HIGH (ref 0.50–1.10)
GFR calc Af Amer: 13 mL/min — ABNORMAL LOW (ref >90)
Potassium: 3.5 mEq/L (ref 3.5–5.1)
Sodium: 140 mEq/L (ref 135–145)

## 2011-09-06 MED ORDER — PANTOPRAZOLE SODIUM 40 MG IV SOLR
40.0000 mg | Freq: Two times a day (BID) | INTRAVENOUS | Status: DC
  Administered 2011-09-06: 40 mg via INTRAVENOUS
  Filled 2011-09-06: qty 40

## 2011-09-06 MED ORDER — BIOTENE DRY MOUTH MT LIQD
15.0000 mL | Freq: Two times a day (BID) | OROMUCOSAL | Status: DC
  Administered 2011-09-07 – 2011-09-14 (×16): 15 mL via OROMUCOSAL

## 2011-09-06 MED ORDER — SODIUM CHLORIDE 0.9 % IV BOLUS (SEPSIS)
250.0000 mL | Freq: Once | INTRAVENOUS | Status: AC
  Administered 2011-09-06: 250 mL via INTRAVENOUS

## 2011-09-06 MED ORDER — CHLORHEXIDINE GLUCONATE 0.12 % MT SOLN
15.0000 mL | Freq: Two times a day (BID) | OROMUCOSAL | Status: DC
  Administered 2011-09-06 – 2011-09-15 (×18): 15 mL via OROMUCOSAL
  Filled 2011-09-06 (×22): qty 15

## 2011-09-06 NOTE — Progress Notes (Signed)
[facility] PROGRESS NOTE  Channel [name] [name] ZOX:[id number] DOB: 1923-08-06 DOA: 08/31/2011 PCP: [name] Medal, MD   Assessment/Plan: 1. Diarrhea leading to sepsis/colitis found to be secondary to salmonella- GI consulted- flex sig done, biopsy s taken- pending, cipro/flagyl started- now d/c'd, c-diff negtive  2. Hypotension/septic shock- neo weaned off 3. A fib- daughter says patient has a history of this, start metoprolol for rate control, and increase, and an ASA 4. AKI- prob ATN (hypotension) trend- consult renal, renal u/s ordered, UOP good, IV lasix 5. Wheezing- lasix, nebs ordered change to ATC- improved-  am chst xray show pulm edema (IV lasix, c-pap) 6. Dehydration secondary to diarrhea- received 4+L IVF 7. HLD 8. Increased CKs- IVF, recheck in AM- resolved 9. UTI- on IV cipro- e coli/enterobacter- sensitive to cipro day #5-d/c recheck U/a ok 10. Generalized weakness- PT- will need SNF 11. Increase work of breathing/osa suspicion- get ABG and add cpap for now  Code Status: full Family Communication: updated at bedside-daughter Disposition Plan: step down   Patient is very sick  Still- poor overall prognosis   [name] [name], D.O.  [facility] Pager [phone]  09/06/2011, 11:30 AM  LOS: 6 days    Consultants:  GI  Critical care  Interim note 78 yo admitted with profuse diarrhea- salmonella.  Septic shock- off pressors but now fluid overload secondary to resuscitation efforts.  AKI. Still very ill.     Subjective:  Diarrhea resolved Seems more lethargic today +SOB     Objective: Filed Vitals:   09/06/11 0000 09/06/11 0135 09/06/11 0400 09/06/11 0939  BP:   127/56 133/74  Pulse:   100 102  Temp:   99 F (37.2 C)   TempSrc: Core (Comment)  Core (Comment)   Resp:   21   Height:      Weight: 95.2 kg (209 lb 14.1 oz)     SpO2:  98% 98%     Intake/Output Summary (Last 24 hours) at 09/06/11 1130 Last data filed at  09/06/11 0400  Gross per 24 hour  Intake    260 ml  Output   3225 ml  Net  -2965 ml    Exam:  GEN: awake, ill appearing CHEST: B/L wheezing, coarse BS HEART: tachy rate and ir rhythm; no murmurs rubs or gallops  ABDOMEN: Obese, soft non-tender; no masses, no organomegaly, normal abdominal bowel sounds; no pannus EXTREMITIES: No bone or joint deformity; age-appropriate arthropathy of the hands and knees; + edema hands > legs; no ulcerations.  PULSES: 2+ and symmetric  SKIN:  no rash or ulceration  CNS: Cranial nerves 2-12 grossly intact no focal lateralizing neurologic deficit- but generalized weakness  Data Reviewed: Basic Metabolic Panel:  Lab 09/06/11 8295 09/05/11 0518 09/04/11 1039 09/03/11 0432 09/02/11 0333 09/02/11 0015  NA 138 137 137 135 134* --  K 3.6 3.7 -- -- -- --  CL 114* 117* 116* 117* 116* --  CO2 15* 14* 13* 13* 13* --  GLUCOSE 97 120* 159* 126* 156* --  BUN 45* 38* 32* 23 21 --  CREATININE 3.45* 3.25* 2.86* 2.20* 1.57* --  CALCIUM 8.7 8.6 8.3* 7.6* 6.8* --  MG -- -- -- 2.5 1.5 1.6  PHOS 4.6 -- -- 2.3 -- --   Liver Function Tests:  Lab 09/06/11 0530 08/31/11 2207 08/30/11 2059  AST -- 38* 18  ALT -- 16 13  ALKPHOS -- 52 50  BILITOT -- 1.0 0.7  PROT -- 6.6 5.9*  ALBUMIN 2.4* 3.1*  3.1*    Lab 08/31/11 2207 08/30/11 2059  LIPASE 15 32  AMYLASE -- --   No results found for this basename: AMMONIA:5 in the last 168 hours CBC:  Lab 09/06/11 0530 09/05/11 0518 09/03/11 0432 09/02/11 0333 09/01/11 0332 08/31/11 2207 08/30/11 2059  WBC 9.4 11.5* 7.2 4.5 4.3 -- --  NEUTROABS -- -- -- -- -- 3.3 9.5*  HGB 10.4* 10.5* 10.5* 9.9* 10.9* -- --  HCT 31.3* 31.6* 32.3* 30.3* 33.4* -- --  MCV 92.6 94.3 94.7 97.1 96.5 -- --  PLT 165 172 167 119* 136* -- --   Cardiac Enzymes:  Lab 09/06/11 0530 09/05/11 0518 09/02/11 0015 08/31/11 2207  CKTOTAL 70 160 1745* --  CKMB -- -- 10.8* --  CKMBINDEX -- -- -- --  TROPONINI -- -- <0.30 <0.30   BNP: No components  found with this basename: POCBNP:5 CBG:  Lab 09/02/11 1620  GLUCAP 100*    Recent Results (from the past 240 hour(s))  URINE CULTURE     Status: Normal   Collection Time   08/30/11  9:53 PM      Component Value Range Status Comment   Specimen Description URINE, CATHETERIZED   Final    Special Requests NONE   Final    Culture  Setup Time 08/31/2011 02:49   Final    Colony Count >=100,000 COLONIES/ML   Final    Culture     Final    Value: ENTEROBACTER AEROGENES     ESCHERICHIA COLI   Report Status 09/03/2011 FINAL   Final    Organism ID, Bacteria ENTEROBACTER AEROGENES   Final    Organism ID, Bacteria ESCHERICHIA COLI   Final   URINE CULTURE     Status: Normal   Collection Time   08/31/11 10:37 PM      Component Value Range Status Comment   Specimen Description URINE, CATHETERIZED   Final    Special Requests NONE   Final    Culture  Setup Time 09/01/2011 04:03   Final    Colony Count >=100,000 COLONIES/ML   Final    Culture ENTEROBACTER AEROGENES   Final    Report Status 09/03/2011 FINAL   Final    Organism ID, Bacteria ENTEROBACTER AEROGENES   Final   CULTURE, BLOOD (ROUTINE X 2)     Status: Normal (Preliminary result)   Collection Time   09/01/11 12:10 AM      Component Value Range Status Comment   Specimen Description BLOOD LEFT ANTECUBITAL   Final    Special Requests BOTTLES DRAWN AEROBIC AND ANAEROBIC 5CC   Final    Culture  Setup Time [id number]   Final    Culture     Final    Value:        BLOOD CULTURE RECEIVED NO GROWTH TO DATE CULTURE WILL BE HELD FOR 5 DAYS BEFORE ISSUING A FINAL NEGATIVE REPORT   Report Status PENDING   Incomplete   CULTURE, BLOOD (ROUTINE X 2)     Status: Normal (Preliminary result)   Collection Time   09/01/11 12:15 AM      Component Value Range Status Comment   Specimen Description BLOOD R FOOT   Final    Special Requests BOTTLES DRAWN AEROBIC AND ANAEROBIC   Final    Culture  Setup Time [id number]   Final    Culture     Final     Value:        BLOOD CULTURE RECEIVED NO  GROWTH TO DATE CULTURE WILL BE HELD FOR 5 DAYS BEFORE ISSUING A FINAL NEGATIVE REPORT   Report Status PENDING   Incomplete   STOOL CULTURE     Status: Normal (Preliminary result)   Collection Time   09/01/11  1:48 AM      Component Value Range Status Comment   Specimen Description STOOL   Final    Special Requests NONE   Final    Culture SALMONELLA SPECIES   Final    Report Status PENDING   Incomplete    Organism ID, Bacteria SALMONELLA SPECIES   Final   CLOSTRIDIUM DIFFICILE BY PCR     Status: Normal   Collection Time   09/01/11  1:49 AM      Component Value Range Status Comment   C difficile by pcr NEGATIVE  NEGATIVE Final   MRSA PCR SCREENING     Status: Normal   Collection Time   09/01/11  1:49 AM      Component Value Range Status Comment   MRSA by PCR NEGATIVE  NEGATIVE Final      Studies: Ct Abdomen Pelvis W Contrast  08/31/2011  *RADIOLOGY REPORT*  Clinical Data: Lethargy, nausea, vomiting, diarrhea, and hypotension.  CT ABDOMEN AND PELVIS WITH CONTRAST  Technique:  Multidetector CT imaging of the abdomen and pelvis was performed following the standard protocol during bolus administration of intravenous contrast.  Contrast: OMNIPAQUE IOHEXOL 300 MG/ML  SOLN  Comparison: None.  Findings: Mild dependent atelectasis in the lung bases.  Probable cardiac enlargement.  The liver, spleen, gallbladder, pancreas, adrenal glands, kidneys, and retroperitoneal lymph nodes are unremarkable.  Diffuse calcification and tortuosity of the aorta without aneurysm.  Flow is demonstrated in the mesenteric and portal vessels.  There is a small amount of fluid around the posterior liver edge on the right. The stomach and small bowel are decompressed.  Prominent visceral adipose tissues.  No free fluid or free air in the abdomen.  The colon is not distended but is fluid-filled consistent with liquid stool.  There is suggestion of mild diffuse colonic wall thickening  suggesting edema or colitis.  Infectious or inflammatory colitis could have this appearance.  Pelvis:  The uterus is anteverted.  Uterus and adnexal structures are not enlarged.  Minimal free fluid in the pelvis.  Foley catheter decompresses the bladder.  No loculated pelvic fluid collections.  Diverticula in the sigmoid colon without diverticulitis.  The appendix is normal.  Extensive degenerative changes throughout the lumbar spine with mild lumbar scoliosis. Mild anterior subluxation of L4 on L5.  IMPRESSION: Mild diffuse wall thickening and liquid stool in the colon suggesting infectious or inflammatory process.  Minimal fluid around the right liver edge and in the pelvis.  No evidence of focal mass or abscess.  Original Report Authenticated By: Marlon Pel, M.D.   Dg Chest Port 1 View  08/31/2011  *RADIOLOGY REPORT*  Clinical Data: Cough.  Shortness of breath and weakness.  PORTABLE CHEST - 1 VIEW  Comparison: 02/04/2009  Findings: Shallow inspiration.  Cardiac enlargement with normal pulmonary vascularity.  No focal airspace consolidation or edema. No blunting of costophrenic angles.  No pneumothorax.  Surgical clips in the base of the neck.  Calcification of the aorta.  No significant change since previous study, allowing for technical differences.  IMPRESSION: Cardiac enlargement without significant vascular congestion or edema.  Original Report Authenticated By: Marlon Pel, M.D.    Scheduled Meds:    . antiseptic oral rinse  15 mL Mouth Rinse BID  . aspirin  325 mg Oral Daily  . atorvastatin  40 mg Oral Daily  . colesevelam  1,875 mg Oral BID WC  . furosemide  80 mg Intravenous TID  . levalbuterol  0.63 mg Nebulization Q6H  . metoprolol tartrate  50 mg Oral BID  . multivitamin with minerals  1 tablet Oral Daily  . omega-3 acid ethyl esters  1 g Oral BID  . pantoprazole (PROTONIX) IV  40 mg Intravenous Q12H  . raloxifene  60 mg Oral Daily  . sodium chloride  1,000 mL  Intravenous Once  . sodium chloride  3 mL Intravenous Q12H  . DISCONTD: ciprofloxacin  400 mg Intravenous Q24H  . DISCONTD: furosemide  20 mg Oral Daily  . DISCONTD: metoprolol tartrate  25 mg Oral BID  . DISCONTD: pantoprazole  40 mg Oral Q1200   Continuous Infusions:

## 2011-09-06 NOTE — Progress Notes (Signed)
PT Cancellation Note  ___Treatment cancelled today due to medical issues with patient which prohibited   therapy  ___ Treatment cancelled today due to patient receiving procedure or test   ___ Treatment cancelled today due to patient's refusal to participate   _x__ Treatment cancelled today due to respiratory issues/placed on BiPAP.

## 2011-09-06 NOTE — Progress Notes (Signed)
Rehab admissions - Evaluated for possible admission.  Please see rehab consult done 07/01 recommending SNF.  Likely patient cannot tolerate 3 hours of therapy a day.  Additional issue is that patient has [employer] and they are unlikely to approve an inpatient rehab stay based on current diagnosis.  Recommend SNF level therapies.  Call me for questions.  #[phone]

## 2011-09-06 NOTE — Evaluation (Signed)
Clinical/Bedside Swallow Evaluation Patient Details  Name: [name] [name] MRN: [id number] Date of Birth: December 13, 1923  Today's Date: 09/06/2011 Time: 9147-8295 SLP Time Calculation (min): 34 min  Past Medical History:  Past Medical History  Diagnosis Date  . Hypertension   . Hyperlipidemia   . COPD (chronic obstructive pulmonary disease)   . Osteoporosis   . Peptic ulcer disease   . History of kidney stones   . Arthritis   . H/O hiatal hernia   . Chronic back pain    Past Surgical History:  Past Surgical History  Procedure Date  . Carotid endarterectomy     bilateral  . Cataract extraction, bilateral   . Joint replacement 2008    Right Total Knee  . Anal fistula repair   . Left parotidectomy   . Eye surgery   . Flexible sigmoidoscopy 09/02/2011    Procedure: FLEXIBLE SIGMOIDOSCOPY;  Surgeon: Theda Belfast, MD;  Location: [facility];  Service: Endoscopy;  Laterality: N/A;  . Flexible sigmoidoscopy 09/03/2011    Procedure: FLEXIBLE SIGMOIDOSCOPY;  Surgeon: Theda Belfast, MD;  Location: [facility];  Service: Endoscopy;  Laterality: N/A;   HPI:  78 yo female adm to [facility] 08/31/11 with diarrhea and nausea, diagnosed with colitis - salmonella with sepsis.  Pt PMH + for COPD, kidney stones, HTN, HLD, chronic pain, HH, PUD, h/o neck mass s/p parotidectomy.  Pt has been coughing during meals in the last two days.  Daughter reports pt with h/o LPR causing chronic throat clearing but denies pt overtly coughing with intake prior to admission.  SLP heard pt from outside of the room consuming breakfast with overt coughing after two swallows of grits with expectoration of secretions.     Assessment / Plan / Recommendation Clinical Impression  Pt currently is lethargic requiring maximum verbal stimulation to awaken to participate in bedside swallow evaluation that is likely biggest factor for asp risk.  Upon questioning daughter reports pt with LPR prior to admission resulting in chronic  throat clearing, but denies pt overtly choking during meals.  ? focal facial nerve deficit CN 7 - slight right labial droop - daughter states may be new. but uncertain.    There were no clinical s/s of aspiration with thin water, icecream and swallow appeared timely.  Decr labial closure on straw noted, which daughter states noted in the middle of night - ? d/t lethargy and /or component of weakness.  Did not test solids due to asp risk.  At this time, educated daughter to multiple risk factors for aspiration and asp pna and recommend to defer MBS to when pt with consistent improved LOA ot obtain clinical value from MBS.    Daughter reported desire for pt to continue full liquid diet with accepted asp risks and strategies in place to maximize airway protection.   Do not recommend patient consume grits or solids.  SlP to follow up next date.  Thanks for the order.     Aspiration Risk  Severe primarily d/t poor LOA   Diet Recommendation     Liquid Administration via: Cup;Straw Medication Administration: Crushed with puree (whole if small) Supervision: Staff feed patient Compensations: Slow rate;Small sips/bites Postural Changes and/or Swallow Maneuvers: Upright 30-60 min after meal;Seated upright 90 degrees    Other  Recommendations Recommended Consults: MBS (when consistently alert) Oral Care Recommendations: Oral care QID   Follow Up Recommendations  Other (comment) (TBD)    Frequency and Duration min 2x/week  2 weeks   Pertinent  Vitals/Pain Febrile to 99 max, congested    SLP Swallow Goals Patient will utilize recommended strategies during swallow to increase swallowing safety with: Total assistance   Swallow Study Prior Functional Status       General Date of Onset: 09/06/11 HPI: 78 yo female adm to [facility] 08/31/11 with diarrhea and nausea, diagnosed with colitis - salmonella with sepsis.  Pt PMH + for COPD, kidney stones, HTN, HLD, chronic pain, HH, PUD, h/o neck mass s/p  parotidectomy.  Pt has been coughing during meals in the last two days.  Daughter reports pt with h/o LPR causing chronic throat clearing but denies pt overtly coughing with intake prior to admission.  SLP heard pt from outside of the room consuming breakfast with overt coughing after two swallows of grits with expectoration of secretions.   Previous Swallow Assessment: none Diet Prior to this Study: Thin liquids;Other (Comment) (full liquid) Temperature Spikes Noted: Yes Respiratory Status: Supplemental O2 delivered via (comment) (2 liters) Behavior/Cognition: Lethargic;Distractible;Requires cueing;Doesn't follow directions;Decreased sustained attention Oral Cavity - Dentition: Adequate natural dentition Self-Feeding Abilities: Total assist Patient Positioning: Upright in bed Baseline Vocal Quality: Low vocal intensity Volitional Cough: Congested (productive after presumed aspiration of grits) Volitional Swallow: Unable to elicit    Oral/Motor/Sensory Function Overall Oral Motor/Sensory Function: Impaired (gross weakness) Labial ROM: Reduced right Labial Strength: Reduced Lingual Strength: Reduced Facial Symmetry: Right droop Velum: Within Functional Limits Mandible:  (DNT)   Ice Chips Ice chips: Impaired Oral Phase Functional Implications: Prolonged oral transit Pharyngeal Phase Impairments: Suspected delayed Swallow   Thin Liquid Thin Liquid: Impaired Oral Phase Impairments: Reduced labial seal (difficulty forming suction on straw d/t gross weakness)    Nectar Thick Nectar Thick Liquid: Not tested   Honey Thick Honey Thick Liquid: Not tested   Puree Presentation: Spoon Other Comments: icecream:  tsp   Solid Solid: Not tested Other Comments: due to aspiration risk    Donavan Burnet, MS [facility] SLP [phone]

## 2011-09-06 NOTE — Progress Notes (Signed)
**Note De-Identified  Obfuscation** Patient placed on BIPAP 12/6 rr of 10 and 40%; ABG done pre BIPAP, patient tolerating BIPAP well. VS WNL, RT to cont. To monitor

## 2011-09-06 NOTE — Clinical Social Work Psychosocial (Signed)
Clinical Social Work Department BRIEF PSYCHOSOCIAL ASSESSMENT 09/06/2011  Patient:  [name] [name],[name] [name]     Account Number:  [id number]     Admit date:  08/31/2011  Clinical Social Worker:  Jodelle Red  Date/Time:  09/06/2011 11:57 AM  Referred by:  CSW  Date Referred:  09/06/2011 Referred for  Other - See comment   Other Referral:   ADJUSTMENT TO ILLNESS, POSSIBLE SNF   Interview type:  Family Other interview type:   CHART REVIEW, DISCUSSION WITH MD AND RN    PSYCHOSOCIAL DATA Living Status:  ALONE Admitted from facility:   Level of care:   Primary support name:  [name] [name] Primary support relationship to patient:  CHILD, ADULT Degree of support available:   GOOD FROM DAUGHTER    CURRENT CONCERNS Current Concerns  Adjustment to Illness  Post-Acute Placement   Other Concerns:    SOCIAL WORK ASSESSMENT / PLAN CSW SPOKE WITH DAUGHTER TO OFFER SUPPORT AND TO CHECK IN AS PT WILL MOST LIKELY NEED SNF IF PT SURVIVES HOSPITALIZATION. CIR ASSESSED PT AND PT IS NOT A CANDIDATE. DAUGHTER EXPLAINED SHE HAD JUST MOVED PT TO AN INDEPENDENT APARTMENT AT A SENIOR COMMUNITY. THIS COMMUNITY DOES NOT HAVE ANY RESOURCES FOR THERAPIES. DAUGHTER APPEARS TO UNDERSTAND THAT HER MOTHER WILL NEED MORE THAN HER APT AT D/C AND THAT "SHE MAY HAVE A NEW BASELINE". DAUGHTER STRUGGLING WITH LENGTHY HOSPITALIZATION, BUT HAS GOOD SUPPORTS TO ASSIST HER.   Assessment/plan status:  Psychosocial Support/Ongoing Assessment of Needs Other assessment/ plan:   SNF PLACEMENT DEPENDING ON OUTCOMES   Information/referral to community resources:    PATIENT'S/FAMILY'S RESPONSE TO PLAN OF CARE: DAUGHTER COPING ADEQUATELY. HAS GOOD SUPPORTS. CSW WILL FOLLOW FOR POSSIBLE PLACEMENT AND TO ASSIST WITH COPING. DAUGHTER APPRECIATIVE. CSW TO FOLLOW.   Vennie Homans, [place] 09/06/2011 12:07 PM [phone]

## 2011-09-06 NOTE — Progress Notes (Signed)
Subjective: bipap started this afternoon, excellent response to IV lasix, 3700 UOP yest, so far 12 hrs 2500 cc out today  Objective Vital signs in last 24 hours: Filed Vitals:   09/06/11 1000 09/06/11 1200 09/06/11 1400 09/06/11 1401  BP: 151/78 136/75 125/63 145/68  Pulse: 109 95 96 98  Temp: 99.1 F (37.3 C) 99.5 F (37.5 C) 99.5 F (37.5 C)   TempSrc:      Resp: 24 22 23 22   Height:      Weight:      SpO2: 98% 96% 95% 96%   Weight change: -2 kg (-4 lb 6.5 oz)  Intake/Output Summary (Last 24 hours) at 09/06/11 1551 Last data filed at 09/06/11 1500  Gross per 24 hour  Intake    376 ml  Output   5775 ml  Net  -5399 ml   Labs: Basic Metabolic Panel:  Lab 09/06/11 4098 09/05/11 0518 09/04/11 1039 09/03/11 0432 09/02/11 0333 09/02/11 0015 09/01/11 0332  NA 138 137 137 135 134* 133* 134*  K 3.6 3.7 4.2 3.8 3.6 3.8 4.1  CL 114* 117* 116* 117* 116* 111 108  CO2 15* 14* 13* 13* 13* 13* 18*  GLUCOSE 97 120* 159* 126* 156* 113* 111*  BUN 45* 38* 32* 23 21 23 23   CREATININE 3.45* 3.25* 2.86* 2.20* 1.57* 1.52* 1.70*  ALB -- -- -- -- -- -- --  CALCIUM 8.7 8.6 8.3* 7.6* 6.8* 7.4* 8.0*  PHOS 4.6 -- -- 2.3 -- -- --   Liver Function Tests:  Lab 09/06/11 0530 08/31/11 2207 08/30/11 2059  AST -- 38* 18  ALT -- 16 13  ALKPHOS -- 52 50  BILITOT -- 1.0 0.7  PROT -- 6.6 5.9*  ALBUMIN 2.4* 3.1* 3.1*    Lab 08/31/11 2207 08/30/11 2059  LIPASE 15 32  AMYLASE -- --   No results found for this basename: AMMONIA:3 in the last 168 hours CBC:  Lab 09/06/11 0530 09/05/11 0518 09/03/11 0432 09/02/11 0333 08/31/11 2207 08/30/11 2059  WBC 9.4 11.5* 7.2 4.5 -- --  NEUTROABS -- -- -- -- 3.3 9.5*  HGB 10.4* 10.5* 10.5* 9.9* -- --  HCT 31.3* 31.6* 32.3* 30.3* -- --  MCV 92.6 94.3 94.7 97.1 -- --  PLT 165 172 167 119* -- --   PT/INR: @labrcntip (inr:5) Cardiac Enzymes:  Lab 09/06/11 0530 09/05/11 0518 09/02/11 0015 08/31/11 2207  CKTOTAL 70 160 1745* --  CKMB -- -- 10.8* --    CKMBINDEX -- -- -- --  TROPONINI -- -- <0.30 <0.30   CBG:  Lab 09/02/11 1620  GLUCAP 100*    Iron Studies: No results found for this basename: IRON:30,TIBC:30,TRANSFERRIN:30,FERRITIN:30 in the last 168 hours  Physical Exam:  Blood pressure 145/68, pulse 98, temperature 99.5 F (37.5 C), temperature source Core (Comment), resp. rate 22, height 5' (1.524 m), weight 95.2 kg (209 lb 14.1 oz), SpO2 96.00%.  Gen: on bipap, more alert than yesterday Skin: no rash, cyanosis  HEENT: EOMI, sclera anicteric, throat clear  Neck: + JVD, no bruits or LAN  Chest: faint bibasilar crackles  Heart: regular, no rub or gallop, no murmur  Abdomen: soft, obese, nontender, no HSM, no ascites  Ext: 2+ pitting edema of LE's and UE's bilat  Neuro: does not answer questions well, no focal deficit, no asterixis,  Heme/Lymph: as above   Date   Creat  2009   0.7-1.5  2010   1.2-1.3  Admit 6/26  0.99 >> 3.25 on 7/1  UA- 100 prot, large hgb, 3-6wbc, 0-2rbc, many bact  UCx 6/26 + enterobacter aerogenes  Stool cx 6/27 + salmonella species  Blood cx's 6/27 neg x 2  Stool Cdif (-)  CXR 6/26, 6/27 w CM only. 6/29 cxr w new vasc congestion and LLL opacity  ECHO- normal EF   AbxMeds  Cipro 6/27 > 7/1 stopped  Flagyl 6/27 > 7/1 stopped  Protonix 6/30 > current  Omnipaque (iohexol) 6/26 x 1  Impression  1. AKI-  due to ATN from IV contrast exposure on 6/26. Another possibility is AIN due to meds (cipro, PPI- both d/c'd). Creat up again today (3.4) but not as much. Suspect she may be starting to recover.  Excellent UOP with IV lasix.  Will continue IV lasix until pulm edema has resolved.    2. Salmonella enterocolitis 3. Enterobacter UTI 4. Pulm edema/hypoxemia- on bipap now, would continue.  I think we can effect the resp situation positively with continuing current diuretics, given what she has put out in the last  18 hrs.   5. AMS - could be early uremia. Not on any sedating meds. Observe.   6. HTN- olmesartan 20/d, atenolo 50/d and lasix 40/d at home. All of these are on hold currently. 7. Hx COPD 8. Hx HL- on statin and Memory Dance  MD [facility] [phone] pgr    [phone] cell 09/06/2011, 3:51 PM

## 2011-09-07 LAB — BLOOD GAS, ARTERIAL
Acid-base deficit: 6.5 mmol/L — ABNORMAL HIGH (ref 0.0–2.0)
Bicarbonate: 18.5 meq/L — ABNORMAL LOW (ref 20.0–24.0)
Drawn by: 103701
O2 Content: 2 L/min
O2 Saturation: 96.4 %
Patient temperature: 98.6
Patient temperature: 98.6
TCO2: 15.3 mmol/L (ref 0–100)
TCO2: 17 mmol/L (ref 0–100)
pCO2 arterial: 37.1 mmHg (ref 35.0–45.0)
pH, Arterial: 7.283 — ABNORMAL LOW (ref 7.350–7.400)
pH, Arterial: 7.318 — ABNORMAL LOW (ref 7.350–7.400)
pO2, Arterial: 85.7 mmHg (ref 80.0–100.0)

## 2011-09-07 LAB — MAGNESIUM: Magnesium: 2.3 mg/dL (ref 1.5–2.5)

## 2011-09-07 LAB — RENAL FUNCTION PANEL
Albumin: 2.5 g/dL — ABNORMAL LOW (ref 3.5–5.2)
BUN: 53 mg/dL — ABNORMAL HIGH (ref 6–23)
CO2: 18 meq/L — ABNORMAL LOW (ref 19–32)
Calcium: 8.8 mg/dL (ref 8.4–10.5)
Chloride: 111 meq/L (ref 96–112)
Creatinine, Ser: 3.28 mg/dL — ABNORMAL HIGH (ref 0.50–1.10)
GFR calc Af Amer: 14 mL/min — ABNORMAL LOW
GFR calc non Af Amer: 12 mL/min — ABNORMAL LOW
Glucose, Bld: 93 mg/dL (ref 70–99)
Phosphorus: 5 mg/dL — ABNORMAL HIGH (ref 2.3–4.6)
Potassium: 3 meq/L — ABNORMAL LOW (ref 3.5–5.1)
Sodium: 139 meq/L (ref 135–145)

## 2011-09-07 MED ORDER — POTASSIUM CHLORIDE CRYS ER 20 MEQ PO TBCR
20.0000 meq | EXTENDED_RELEASE_TABLET | Freq: Two times a day (BID) | ORAL | Status: AC
  Administered 2011-09-07 (×2): 20 meq via ORAL
  Filled 2011-09-07 (×2): qty 1

## 2011-09-07 MED ORDER — POTASSIUM CHLORIDE 10 MEQ/100ML IV SOLN
10.0000 meq | INTRAVENOUS | Status: AC
  Administered 2011-09-07 (×4): 10 meq via INTRAVENOUS
  Filled 2011-09-07 (×4): qty 100

## 2011-09-07 MED ORDER — SODIUM CHLORIDE 0.9 % IV BOLUS (SEPSIS)
500.0000 mL | Freq: Once | INTRAVENOUS | Status: AC
  Administered 2011-09-07: 500 mL via INTRAVENOUS

## 2011-09-07 NOTE — Progress Notes (Signed)
Physical Therapy Treatment Patient Details Name: [name] [name] MRN: [id number] DOB: 11/02/1923 Today's Date: 09/07/2011 Time: 4098-1191 PT Time Calculation (min): 24 min  PT Assessment / Plan / Recommendation Comments on Treatment Session  pt aroused w/ stimulation for mobility and was able to participate w/ stand pivot transfer and a second trial to stand from chair w/ 2 person asssit. Pt did not appear to have any respiratory distress. RN in room.    Follow Up Recommendations  Skilled nursing facility    Barriers to Discharge        Equipment Recommendations  Defer to next venue    Recommendations for Other Services    Frequency Min 3X/week   Plan Discharge plan remains appropriate;Frequency remains appropriate    Precautions / Restrictions Precautions Precautions: Fall Precaution Comments: monitor VS   Pertinent Vitals/Pain VS PRE HR92 sats 100% 2 l  RR 20 -- during RX.sats 94% 2 l HR 104  RR 27. No acute distress noted    Mobility  Bed Mobility Supine to Sit: 1: +2 Total assist Supine to Sit: Patient Percentage: 20% Sitting - Scoot to Edge of Bed: 1: +2 Total assist Sitting - Scoot to Edge of Bed: Patient Percentage: 20% Details for Bed Mobility Assistance: TC to reach w/ UE, attempt to pull self over. Transfers Transfers: Sit to Stand;Stand to Sit Sit to Stand: 1: +2 Total assist;From chair/3-in-1;From bed Sit to Stand: Patient Percentage: 40% Stand to Sit: 1: +2 Total assist Stand to Sit: Patient Percentage: 40% Stand Pivot Transfers: 1: +2 Total assist Stand Pivot Transfers: Patient Percentage: 40% Details for Transfer Assistance: pt did get weight on Legs but did not attempt to take a step. pt stood a second time w/ +2 for bottom to be cleaned.    Exercises     PT Diagnosis:    PT Problem List:   PT Treatment Interventions:     PT Goals Acute Rehab PT Goals Pt will go Supine/Side to Sit: Independently PT Goal: Supine/Side to Sit - Progress: Progressing  toward goal Pt will go Sit to Stand: with modified independence PT Goal: Sit to Stand - Progress: Progressing toward goal Pt will go Stand to Sit: with modified independence PT Goal: Stand to Sit - Progress: Progressing toward goal Pt will Transfer Bed to Chair/Chair to Bed: with min assist PT Transfer Goal: Bed to Chair/Chair to Bed - Progress: Goal set today  Visit Information  Last PT Received On: 09/07/11 Assistance Needed: +2    Subjective Data  Subjective: i'm OK   Cognition  Overall Cognitive Status: Difficult to assess Area of Impairment: Attention Arousal/Alertness: Lethargic Orientation Level: Other (comment);Place;Time;Situation (oriented to "[place]") Behavior During Session: Flat affect Current Attention Level: Alternating Following Commands: Follows one step commands inconsistently Cognition - Other Comments: pt was very asleep initially. w/ stimulation of sitting up, pt stayed awake. sluggish w/ movements.    Balance  Balance Balance Assessed: Yes Static Sitting Balance Static Sitting - Balance Support: Bilateral upper extremity supported (arms tend to slip ogg of bed from support) Static Sitting - Level of Assistance: 3: Mod assist Static Sitting - Comment/# of Minutes: sat x 5 minutes w/ support on bed the sat x 4 minutes w/ supervision on edge of chair  End of Session PT - End of Session Activity Tolerance: Patient limited by fatigue Patient left: in chair;with call bell/phone within reach;with nursing in room Nurse Communication: Mobility status;Need for lift equipment   GP     Ahlaya Ende,  Jobe Igo 09/07/2011, 2:44 PM

## 2011-09-07 NOTE — Progress Notes (Addendum)
Subjective: Weaned off BiPAP   Objective: Vital signs in last 24 hours: Filed Vitals:   09/07/11 0600 09/07/11 0800 09/07/11 0900 09/07/11 1025  BP: 150/62  132/64 105/64  Pulse: 101 55 61 127  Temp: 98.4 F (36.9 C) 98.6 F (37 C) 99 F (37.2 C)   TempSrc:      Resp: 20 21 21    Height:      Weight:      SpO2: 96% 92% 98%     Intake/Output Summary (Last 24 hours) at 09/07/11 1121 Last data filed at 09/07/11 0836  Gross per 24 hour  Intake    980 ml  Output   6061 ml  Net  -5081 ml    Weight change: -6 kg (-13 lb 3.6 oz)  Gen: on bipap, more alert than yesterday  Skin: no rash, cyanosis  HEENT: EOMI, sclera anicteric, throat clear  Neck: + JVD, no bruits or LAN  Chest: faint bibasilar crackles  Heart: regular, no rub or gallop, no murmur  Abdomen: soft, obese, nontender, no HSM, no ascites  Ext: 2+ pitting edema of LE's and UE's bilat  Neuro: does not answer questions well, no focal deficit, no asterixis,  Heme/Lymph: as above    Lab Results: Results for orders placed during the hospital encounter of 08/31/11 (from the past 24 hour(s))  BLOOD GAS, ARTERIAL     Status: Abnormal   Collection Time   09/06/11  1:50 PM      Component Value Range   FIO2 0.50     Delivery systems V     pH, Arterial 7.229 (*) 7.350 - 7.400   pCO2 arterial 35.7  35.0 - 45.0 mmHg   pO2, Arterial 62.8 (*) 80.0 - 100.0 mmHg   Bicarbonate 14.4 (*) 20.0 - 24.0 mEq/L   TCO2 13.8  0 - 100 mmol/L   Acid-base deficit 12.0 (*) 0.0 - 2.0 mmol/L   O2 Saturation 91.7     Patient temperature 98.6     Collection site RIGHT RADIAL     Drawn by [phone]     Sample type ARTERIAL     Allens test (pass/fail) PASS  PASS  BASIC METABOLIC PANEL     Status: Abnormal   Collection Time   09/06/11  8:46 PM      Component Value Range   Sodium 140  135 - 145 mEq/L   Potassium 3.5  3.5 - 5.1 mEq/L   Chloride 112  96 - 112 mEq/L   CO2 17 (*) 19 - 32 mEq/L   Glucose, Bld 98  70 - 99 mg/dL   BUN 50 (*) 6 - 23  mg/dL   Creatinine, Ser 1.91 (*) 0.50 - 1.10 mg/dL   Calcium 9.1  8.4 - 47.8 mg/dL   GFR calc non Af Amer 11 (*) >90 mL/min   GFR calc Af Amer 13 (*) >90 mL/min  RENAL FUNCTION PANEL     Status: Abnormal   Collection Time   09/07/11  5:00 AM      Component Value Range   Sodium 139  135 - 145 mEq/L   Potassium 3.0 (*) 3.5 - 5.1 mEq/L   Chloride 111  96 - 112 mEq/L   CO2 18 (*) 19 - 32 mEq/L   Glucose, Bld 93  70 - 99 mg/dL   BUN 53 (*) 6 - 23 mg/dL   Creatinine, Ser 2.95 (*) 0.50 - 1.10 mg/dL   Calcium 8.8  8.4 - 62.1 mg/dL  Phosphorus 5.0 (*) 2.3 - 4.6 mg/dL   Albumin 2.5 (*) 3.5 - 5.2 g/dL   GFR calc non Af Amer 12 (*) >90 mL/min   GFR calc Af Amer 14 (*) >90 mL/min  MAGNESIUM     Status: Normal   Collection Time   09/07/11  5:00 AM      Component Value Range   Magnesium 2.3  1.5 - 2.5 mg/dL  BLOOD GAS, ARTERIAL     Status: Abnormal   Collection Time   09/07/11  8:36 AM      Component Value Range   FIO2 0.21     Delivery systems ROOM AIR     pH, Arterial 7.283 (*) 7.350 - 7.400   pCO2 arterial 35.4  35.0 - 45.0 mmHg   pO2, Arterial 68.3 (*) 80.0 - 100.0 mmHg   Bicarbonate 16.2 (*) 20.0 - 24.0 mEq/L   TCO2 15.3  0 - 100 mmol/L   Acid-base deficit 9.2 (*) 0.0 - 2.0 mmol/L   O2 Saturation 92.8     Patient temperature 98.6     Collection site BRACHIAL ARTERY     Drawn by [id number]     Sample type ARTERIAL       Micro: Recent Results (from the past 240 hour(s))  URINE CULTURE     Status: Normal   Collection Time   08/30/11  9:53 PM      Component Value Range Status Comment   Specimen Description URINE, CATHETERIZED   Final    Special Requests NONE   Final    Culture  Setup Time 08/31/2011 02:49   Final    Colony Count >=100,000 COLONIES/ML   Final    Culture     Final    Value: ENTEROBACTER AEROGENES     ESCHERICHIA COLI   Report Status 09/03/2011 FINAL   Final    Organism ID, Bacteria ENTEROBACTER AEROGENES   Final    Organism ID, Bacteria ESCHERICHIA COLI   Final     URINE CULTURE     Status: Normal   Collection Time   08/31/11 10:37 PM      Component Value Range Status Comment   Specimen Description URINE, CATHETERIZED   Final    Special Requests NONE   Final    Culture  Setup Time 09/01/2011 04:03   Final    Colony Count >=100,000 COLONIES/ML   Final    Culture ENTEROBACTER AEROGENES   Final    Report Status 09/03/2011 FINAL   Final    Organism ID, Bacteria ENTEROBACTER AEROGENES   Final   CULTURE, BLOOD (ROUTINE X 2)     Status: Normal   Collection Time   09/01/11 12:10 AM      Component Value Range Status Comment   Specimen Description BLOOD LEFT ANTECUBITAL   Final    Special Requests BOTTLES DRAWN AEROBIC AND ANAEROBIC 5CC   Final    Culture  Setup Time 09/01/2011 04:01   Final    Culture NO GROWTH 5 DAYS   Final    Report Status 09/07/2011 FINAL   Final   CULTURE, BLOOD (ROUTINE X 2)     Status: Normal   Collection Time   09/01/11 12:15 AM      Component Value Range Status Comment   Specimen Description BLOOD R FOOT   Final    Special Requests BOTTLES DRAWN AEROBIC AND ANAEROBIC   Final    Culture  Setup Time 09/01/2011 04:01   Final  Culture NO GROWTH 5 DAYS   Final    Report Status 09/07/2011 FINAL   Final   STOOL CULTURE     Status: Normal (Preliminary result)   Collection Time   09/01/11  1:48 AM      Component Value Range Status Comment   Specimen Description STOOL   Final    Special Requests NONE   Final    Culture SALMONELLA SPECIES   Final    Report Status PENDING   Incomplete    Organism ID, Bacteria SALMONELLA SPECIES   Final   CLOSTRIDIUM DIFFICILE BY PCR     Status: Normal   Collection Time   09/01/11  1:49 AM      Component Value Range Status Comment   C difficile by pcr NEGATIVE  NEGATIVE Final   MRSA PCR SCREENING     Status: Normal   Collection Time   09/01/11  1:49 AM      Component Value Range Status Comment   MRSA by PCR NEGATIVE  NEGATIVE Final     Studies/Results: US Renal Port  09/05/2011   *RADIOLOGY REPORT*  Clinical Data:  Acute renal insufficiency  RENAL/URINARY TRACT ULTRASOUND COMPLETE  Comparison:  08/31/2011  Findings:  Right Kidney:  Measures 10.4 cm. Normal echogenicity.  There is mild scarring and volume loss involving the inferior pole.  No evidence of mass or hydronephrosis.  Left Kidney:  Measures 11.1 cm.  Normal in size and parenchymal echogenicity.  No evidence of mass or hydronephrosis.  Bladder:  Appears normal for degree of bladder distention.  IMPRESSION:  1.  No evidence for obstructive uropathy. 2.  Right renal scarring  Original Report Authenticated By: Rosealee Albee, M.D.   Dg Chest Port 1 View  09/06/2011  *RADIOLOGY REPORT*  Clinical Data: Congestive heart failure  PORTABLE CHEST - 1 VIEW  Comparison: Chest radiograph 09/03/2011  Findings: Right central venous line is in place.  Stable cardiac silhouette.  Central venous pulmonary congestion is increased compared to prior. Interstitial edema is present.  Lung bases are excluded.  No pneumothorax.  IMPRESSION: Increased central venous congestion and interstitial edema.  Original Report Authenticated By: Genevive Bi, M.D.    Medications:  Scheduled Meds:   . antiseptic oral rinse  15 mL Mouth Rinse q12n4p  . aspirin  325 mg Oral Daily  . chlorhexidine  15 mL Mouth Rinse BID  . furosemide  80 mg Intravenous TID  . levalbuterol  0.63 mg Nebulization Q6H  . metoprolol tartrate  50 mg Oral BID  . multivitamin with minerals  1 tablet Oral Daily  . potassium chloride  10 mEq Intravenous Q1 Hr x 4  . potassium chloride  20 mEq Oral BID  . sodium chloride  1,000 mL Intravenous Once  . sodium chloride  250 mL Intravenous Once  . sodium chloride  500 mL Intravenous Once  . sodium chloride  3 mL Intravenous Q12H  . DISCONTD: antiseptic oral rinse  15 mL Mouth Rinse BID  . DISCONTD: atorvastatin  40 mg Oral Daily  . DISCONTD: colesevelam  1,875 mg Oral BID WC  . DISCONTD: omega-3 acid ethyl esters  1 g Oral  BID  . DISCONTD: pantoprazole  40 mg Oral Q1200  . DISCONTD: pantoprazole (PROTONIX) IV  40 mg Intravenous Q12H  . DISCONTD: raloxifene  60 mg Oral Daily   Continuous Infusions:  PRN Meds:.acetaminophen, metoprolol   Assessment: Principal Problem:  *Septic shock Active Problems:  Colitis presumed infectious  Hyperlipidemia  Acute renal failure  Salmonella enteritis   Plan:  1. AKI- due to ATN from IV contrast exposure on 6/26. Another possibility is AIN due to meds (cipro, PPI- both d/c'd). Creat up again today (3.4) but not as much. Creatinine is improving Excellent UOP with IV lasix. Will continue IV lasix until pulm edema has resolved.  2. Salmonella enterocolitis status post flexible sigmoidoscopy, biopsies taken pending at this time, ciprofloxacin and Flagyl have been discontinued 3. Enterobacter UTI completed a 5 day course of ciprofloxacin 4. Pulm edema/hypoxemia- of bipap now, would continue. Stable on nasal cannula. Wheezing has improved. AMS - could be early uremia. Not on any sedating meds. Observe.  5. HTN- olmesartan 20/d, atenolo 50/d and lasix 40/d at home. All of these are on hold currently. 6. Hx COPD 7. Hx HL- on statin and Welchol 8. Hypotension/septic shock- neo weaned off   LOS: 7 days   Morey Andonian 09/07/2011, 11:21 AM

## 2011-09-07 NOTE — Progress Notes (Signed)
Subjective: I/O negative 5.7 liters yesterday, off of BiPap and creatinine down at 3.28 today.  Getting out of bed w assistance.   Objective Vital signs in last 24 hours: Filed Vitals:   09/07/11 0800 09/07/11 0900 09/07/11 1025 09/07/11 1200  BP:  132/64 105/64 128/69  Pulse: 55 61 127 74  Temp: 98.6 F (37 C) 99 F (37.2 C)  99.1 F (37.3 C)  TempSrc:      Resp: 21 21  19   Height:      Weight:      SpO2: 92% 98%  100%   Weight change: -6 kg (-13 lb 3.6 oz)  Intake/Output Summary (Last 24 hours) at 09/07/11 1416 Last data filed at 09/07/11 1232  Gross per 24 hour  Intake   1280 ml  Output   5711 ml  Net  -4431 ml   Labs: Basic Metabolic Panel:  Lab 09/07/11 1610 09/06/11 2046 09/06/11 0530 09/05/11 0518 09/04/11 1039 09/03/11 0432 09/02/11 0333  NA 139 140 138 137 137 135 134*  K 3.0* 3.5 3.6 3.7 4.2 3.8 3.6  CL 111 112 114* 117* 116* 117* 116*  CO2 18* 17* 15* 14* 13* 13* 13*  GLUCOSE 93 98 97 120* 159* 126* 156*  BUN 53* 50* 45* 38* 32* 23 21  CREATININE 3.28* 3.41* 3.45* 3.25* 2.86* 2.20* 1.57*  ALB -- -- -- -- -- -- --  CALCIUM 8.8 9.1 8.7 8.6 8.3* 7.6* 6.8*  PHOS 5.0* -- 4.6 -- -- 2.3 --   Liver Function Tests:  Lab 09/07/11 0500 09/06/11 0530 08/31/11 2207  AST -- -- 38*  ALT -- -- 16  ALKPHOS -- -- 52  BILITOT -- -- 1.0  PROT -- -- 6.6  ALBUMIN 2.5* 2.4* 3.1*    Lab 08/31/11 2207  LIPASE 15  AMYLASE --   No results found for this basename: AMMONIA:3 in the last 168 hours CBC:  Lab 09/06/11 0530 09/05/11 0518 09/03/11 0432 09/02/11 0333 08/31/11 2207  WBC 9.4 11.5* 7.2 4.5 --  NEUTROABS -- -- -- -- 3.3  HGB 10.4* 10.5* 10.5* 9.9* --  HCT 31.3* 31.6* 32.3* 30.3* --  MCV 92.6 94.3 94.7 97.1 --  PLT 165 172 167 119* --   PT/INR: @labrcntip (inr:5) Cardiac Enzymes:  Lab 09/06/11 0530 09/05/11 0518 09/02/11 0015 08/31/11 2207  CKTOTAL 70 160 1745* --  CKMB -- -- 10.8* --  CKMBINDEX -- -- -- --  TROPONINI -- -- <0.30 <0.30   CBG:  Lab  09/02/11 1620  GLUCAP 100*    Iron Studies: No results found for this basename: IRON:30,TIBC:30,TRANSFERRIN:30,FERRITIN:30 in the last 168 hours  Physical Exam:  Blood pressure 128/69, pulse 74, temperature 99.1 F (37.3 C), temperature source Core (Comment), resp. rate 19, height 5' (1.524 m), weight 89.2 kg (196 lb 10.4 oz), SpO2 100.00%.  Gen: on nasal cannula oxygen, much more alert. Sitting up on side of bed with assistance. Skin: no rash, cyanosis  HEENT: EOMI, sclera anicteric, throat clear  Neck: + JVD, no bruits or LAN  Chest: clear Heart: regular, no rub or gallop, no murmur  Abdomen: soft, obese, nontender, no HSM, no ascites  Ext: improved edema of extremities Neuro: nonfocal  Date   Creat  2009   0.7-1.5  2010   1.2-1.3  Admit 6/26  0.99 >> 3.25 on 7/1   UA- 100 prot, large hgb, 3-6wbc, 0-2rbc, many bact  UCx 6/26 + enterobacter aerogenes  Stool cx 6/27 + salmonella species  Blood cx's  6/27 neg x 2  Stool Cdif (-)  CXR 6/26, 6/27 w CM only. 6/29 cxr w new vasc congestion and LLL opacity  ECHO- normal EF   Impression  1. AKI due to contrast nephropathy- resolving. Creat down today first time.  2. Pulm edema- much better, resolved. 3. AMS- much better.  4. Salmonella enterocolitis/Enterobacter UTI- per primary 5. HTN- olmesartan 20/d, atenolo 50/d and lasix 40/d at home. On metoprolol 50 bid here  Plan- Stop lasix, watch creatinine, d/c fluid restriction.    Vinson Moselle  MD [facility] [employer] [phone] pgr    [phone] cell 09/07/2011, 2:16 PM

## 2011-09-07 NOTE — Progress Notes (Signed)
SLP Cancellation Note    Treatment cancelled today due to pt currently working with PT.  Will follow up 09/08/11 to assess tolerance and aid in dietary recommendations.   Per discussion with RN, pt took down medicine with icecream and had a subtle throat clear after swallow.  Per RN pt remains lethargic and therefore this clinician does not recommend advancement to solids due to asp risk. Note pt is on fluid restriction.   SLP to return next date.  Thanks.     Donavan Burnet, MS [facility] SLP [phone]

## 2011-09-08 LAB — RENAL FUNCTION PANEL
BUN: 52 mg/dL — ABNORMAL HIGH (ref 6–23)
CO2: 22 mEq/L (ref 19–32)
Chloride: 110 mEq/L (ref 96–112)
Creatinine, Ser: 2.7 mg/dL — ABNORMAL HIGH (ref 0.50–1.10)
Glucose, Bld: 95 mg/dL (ref 70–99)

## 2011-09-08 LAB — BLOOD GAS, ARTERIAL
Bicarbonate: 20.9 mEq/L (ref 20.0–24.0)
pCO2 arterial: 39.8 mmHg (ref 35.0–45.0)
pH, Arterial: 7.34 — ABNORMAL LOW (ref 7.350–7.450)
pO2, Arterial: 71 mmHg — ABNORMAL LOW (ref 80.0–100.0)

## 2011-09-08 MED ORDER — LEVALBUTEROL HCL 0.63 MG/3ML IN NEBU
0.6300 mg | INHALATION_SOLUTION | Freq: Four times a day (QID) | RESPIRATORY_TRACT | Status: DC
  Administered 2011-09-09 – 2011-09-15 (×22): 0.63 mg via RESPIRATORY_TRACT
  Filled 2011-09-08 (×29): qty 3

## 2011-09-08 MED ORDER — POTASSIUM CHLORIDE 10 MEQ/100ML IV SOLN
10.0000 meq | INTRAVENOUS | Status: AC
  Administered 2011-09-08 (×4): 10 meq via INTRAVENOUS
  Filled 2011-09-08 (×4): qty 100

## 2011-09-08 MED ORDER — POTASSIUM CHLORIDE CRYS ER 20 MEQ PO TBCR
40.0000 meq | EXTENDED_RELEASE_TABLET | Freq: Two times a day (BID) | ORAL | Status: AC
  Administered 2011-09-08 (×2): 40 meq via ORAL
  Filled 2011-09-08: qty 1
  Filled 2011-09-08: qty 2
  Filled 2011-09-08: qty 1

## 2011-09-08 MED ORDER — SODIUM CHLORIDE 0.9 % IJ SOLN
10.0000 mL | INTRAMUSCULAR | Status: DC | PRN
  Administered 2011-09-09 (×3): 10 mL
  Administered 2011-09-10 – 2011-09-11 (×3): 30 mL
  Administered 2011-09-11 – 2011-09-14 (×5): 10 mL

## 2011-09-08 MED ORDER — POTASSIUM CHLORIDE CRYS ER 20 MEQ PO TBCR: 20.0000 meq | EXTENDED_RELEASE_TABLET | Freq: Two times a day (BID) | ORAL | Status: DC

## 2011-09-08 MED ORDER — DILTIAZEM HCL ER COATED BEADS 240 MG PO CP24
240.0000 mg | ORAL_CAPSULE | Freq: Every day | ORAL | Status: DC
  Administered 2011-09-08 – 2011-09-15 (×8): 240 mg via ORAL
  Filled 2011-09-08 (×8): qty 1

## 2011-09-08 NOTE — Progress Notes (Signed)
Pt arrived to 1416 in stable condition.  Alert and oriented, denies pain or concerns at this time.  Oriented to floor, staff, heart monitor and room equipment.  Dinner tray accompanied pt to floor from stepdown - pt set up for meal, reminded of aspiration precautions.  Currently eating dinner.  Will monitor.  Ardyth Gal, RN 09/08/2011

## 2011-09-08 NOTE — Progress Notes (Signed)
Subjective: Feels much better, on medical floor now, up in chair, no SOB. Good UOP, off lasix - 3450 last 24 hrs. Creat down 2.7 Objective Vital signs in last 24 hours: Filed Vitals:   09/08/11 1430 09/08/11 1500 09/08/11 1700 09/08/11 1755  BP:  124/54 114/52 108/66  Pulse:  101 91 98  Temp:  99.9 F (37.7 C) 100 F (37.8 C) 98 F (36.7 C)  TempSrc:    Oral  Resp:  24 36 20  Height:      Weight:      SpO2: 99% 100% 100% 95%   Weight change: -3.9 kg (-8 lb 9.6 oz)  Intake/Output Summary (Last 24 hours) at 09/08/11 1832 Last data filed at 09/08/11 1813  Gross per 24 hour  Intake   1853 ml  Output   1975 ml  Net   -122 ml   Labs: Basic Metabolic Panel:  Lab 09/08/11 9562 09/07/11 0500 09/06/11 2046 09/06/11 0530 09/05/11 0518 09/04/11 1039 09/03/11 0432  NA 141 139 140 138 137 137 135  K 3.3* 3.0* 3.5 3.6 3.7 4.2 3.8  CL 110 111 112 114* 117* 116* 117*  CO2 22 18* 17* 15* 14* 13* 13*  GLUCOSE 95 93 98 97 120* 159* 126*  BUN 52* 53* 50* 45* 38* 32* 23  CREATININE 2.70* 3.28* 3.41* 3.45* 3.25* 2.86* 2.20*  ALB -- -- -- -- -- -- --  CALCIUM 8.4 8.8 9.1 8.7 8.6 8.3* 7.6*  PHOS 3.8 5.0* -- 4.6 -- -- 2.3   Liver Function Tests:  Lab 09/08/11 0518 09/07/11 0500 09/06/11 0530  AST -- -- --  ALT -- -- --  ALKPHOS -- -- --  BILITOT -- -- --  PROT -- -- --  ALBUMIN 2.5* 2.5* 2.4*   No results found for this basename: LIPASE:3,AMYLASE:3 in the last 168 hours No results found for this basename: AMMONIA:3 in the last 168 hours CBC:  Lab 09/06/11 0530 09/05/11 0518 09/03/11 0432 09/02/11 0333  WBC 9.4 11.5* 7.2 4.5  NEUTROABS -- -- -- --  HGB 10.4* 10.5* 10.5* 9.9*  HCT 31.3* 31.6* 32.3* 30.3*  MCV 92.6 94.3 94.7 97.1  PLT 165 172 167 119*   PT/INR: @labrcntip (inr:5) Cardiac Enzymes:  Lab 09/06/11 0530 09/05/11 0518 09/02/11 0015  CKTOTAL 70 160 1745*  CKMB -- -- 10.8*  CKMBINDEX -- -- --  TROPONINI -- -- <0.30   CBG:  Lab 09/02/11 1620  GLUCAP 100*     Iron Studies: No results found for this basename: IRON:30,TIBC:30,TRANSFERRIN:30,FERRITIN:30 in the last 168 hours  Physical Exam:  Blood pressure 108/66, pulse 98, temperature 98 F (36.7 C), temperature source Oral, resp. rate 20, height 5' (1.524 m), weight 85.3 kg (188 lb 0.8 oz), SpO2 95.00%.  Gen: on nasal cannula oxygen, much more alert. Sitting up on side of bed with assistance. Skin: no rash, cyanosis  HEENT: EOMI, sclera anicteric, throat clear  Neck: + JVD, no bruits or LAN  Chest: clear Heart: regular, no rub or gallop, no murmur  Abdomen: soft, obese, nontender, no HSM, no ascites  Ext: improved edema of extremities Neuro: nonfocal  Date   Creat  2009   0.7-1.5  2010   1.2-1.3  Admit 6/26  0.99 >> 3.25 on 7/1   UA- 100 prot, large hgb, 3-6wbc, 0-2rbc, many bact  UCx 6/26 + enterobacter aerogenes  Stool cx 6/27 + salmonella species  Blood cx's 6/27 neg x 2  Stool Cdif (-)  CXR 6/26, 6/27 w  CM only. 6/29 cxr w new vasc congestion and LLL opacity  ECHO- normal EF   Impression  1. AKI due to contrast nephropathy- resolving, creat down 2.7 (0.9 on admission). Expect further improvement. Volume excess resolved. 2. Pulm edema- resolved. 3. AMS- resolved 4. Salmonella enterocolitis/Enterobacter UTI- per primary; better.  5. HTN- olmesartan 20/d, atenolo 50/d and lasix 40/d at home. On metoprolol 50 bid here. Would not restart ARB until creatinine back to baseline (prob as outpt).   Plan- Continue to hold diuretics, reintroduce BP meds as needed (except ARB), expect continued improvement in renal function. No other suggestions, will sign off. Please call as needed.    [name] Moselle  MD [facility] [phone] pgr    [phone] cell 09/08/2011, 6:32 PM

## 2011-09-08 NOTE — Progress Notes (Addendum)
Subjective: Soft blood pressure overnight However hemodynamically stable otherwise Urine output 3450 cc in the last 24 hours   Objective: Vital signs in last 24 hours: Filed Vitals:   09/08/11 0106 09/08/11 0200 09/08/11 0300 09/08/11 0500  BP:   109/74 118/53  Pulse: 111 101 107 105  Temp:  99 F (37.2 C) 99 F (37.2 C) 99 F (37.2 C)  TempSrc:      Resp:  21 23 22   Height:      Weight:    85.3 kg (188 lb 0.8 oz)  SpO2:  95% 92% 98%    Intake/Output Summary (Last 24 hours) at 09/08/11 0745 Last data filed at 09/08/11 0523  Gross per 24 hour  Intake   1563 ml  Output   3450 ml  Net  -1887 ml    Weight change: -3.9 kg (-8 lb 9.6 oz)  Gen: on nasal cannula oxygen, much more alert. Sitting up on side of bed with assistance.  Skin: no rash, cyanosis  HEENT: EOMI, sclera anicteric, throat clear  Neck: + JVD, no bruits or LAN  Chest: clear  Heart: regular, no rub or gallop, no murmur  Abdomen: soft, obese, nontender, no HSM, no ascites  Ext: improved edema of extremities  Neuro: nonfocal   Lab Results: Results for orders placed during the hospital encounter of 08/31/11 (from the past 24 hour(s))  BLOOD GAS, ARTERIAL     Status: Abnormal   Collection Time   09/07/11  8:36 AM      Component Value Range   FIO2 0.21     Delivery systems ROOM AIR     pH, Arterial 7.283 (*) 7.350 - 7.400   pCO2 arterial 35.4  35.0 - 45.0 mmHg   pO2, Arterial 68.3 (*) 80.0 - 100.0 mmHg   Bicarbonate 16.2 (*) 20.0 - 24.0 mEq/L   TCO2 15.3  0 - 100 mmol/L   Acid-base deficit 9.2 (*) 0.0 - 2.0 mmol/L   O2 Saturation 92.8     Patient temperature 98.6     Collection site BRACHIAL ARTERY     Drawn by [id number]     Sample type ARTERIAL    BLOOD GAS, ARTERIAL     Status: Abnormal   Collection Time   09/07/11  3:25 PM      Component Value Range   O2 Content 2.0     Delivery systems NASAL CANNULA     pH, Arterial 7.318 (*) 7.350 - 7.400   pCO2 arterial 37.1  35.0 - 45.0 mmHg   pO2, Arterial  85.7  80.0 - 100.0 mmHg   Bicarbonate 18.5 (*) 20.0 - 24.0 mEq/L   TCO2 17.0  0 - 100 mmol/L   Acid-base deficit 6.5 (*) 0.0 - 2.0 mmol/L   O2 Saturation 96.4     Patient temperature 98.6     Collection site BRACHIAL ARTERY     Drawn by [id number]     Sample type ARTERIAL    RENAL FUNCTION PANEL     Status: Abnormal   Collection Time   09/08/11  5:18 AM      Component Value Range   Sodium 141  135 - 145 mEq/L   Potassium 3.3 (*) 3.5 - 5.1 mEq/L   Chloride 110  96 - 112 mEq/L   CO2 22  19 - 32 mEq/L   Glucose, Bld 95  70 - 99 mg/dL   BUN 52 (*) 6 - 23 mg/dL   Creatinine, Ser 9.56 (*) 0.50 -  1.10 mg/dL   Calcium 8.4  8.4 - 16.1 mg/dL   Phosphorus 3.8  2.3 - 4.6 mg/dL   Albumin 2.5 (*) 3.5 - 5.2 g/dL   GFR calc non Af Amer 15 (*) >90 mL/min   GFR calc Af Amer 17 (*) >90 mL/min     Micro: Recent Results (from the past 240 hour(s))  URINE CULTURE     Status: Normal   Collection Time   08/30/11  9:53 PM      Component Value Range Status Comment   Specimen Description URINE, CATHETERIZED   Final    Special Requests NONE   Final    Culture  Setup Time 08/31/2011 02:49   Final    Colony Count >=100,000 COLONIES/ML   Final    Culture     Final    Value: ENTEROBACTER AEROGENES     ESCHERICHIA COLI   Report Status 09/03/2011 FINAL   Final    Organism ID, Bacteria ENTEROBACTER AEROGENES   Final    Organism ID, Bacteria ESCHERICHIA COLI   Final   URINE CULTURE     Status: Normal   Collection Time   08/31/11 10:37 PM      Component Value Range Status Comment   Specimen Description URINE, CATHETERIZED   Final    Special Requests NONE   Final    Culture  Setup Time 09/01/2011 04:03   Final    Colony Count >=100,000 COLONIES/ML   Final    Culture ENTEROBACTER AEROGENES   Final    Report Status 09/03/2011 FINAL   Final    Organism ID, Bacteria ENTEROBACTER AEROGENES   Final   CULTURE, BLOOD (ROUTINE X 2)     Status: Normal   Collection Time   09/01/11 12:10 AM      Component Value Range  Status Comment   Specimen Description BLOOD LEFT ANTECUBITAL   Final    Special Requests BOTTLES DRAWN AEROBIC AND ANAEROBIC 5CC   Final    Culture  Setup Time 09/01/2011 04:01   Final    Culture NO GROWTH 5 DAYS   Final    Report Status 09/07/2011 FINAL   Final   CULTURE, BLOOD (ROUTINE X 2)     Status: Normal   Collection Time   09/01/11 12:15 AM      Component Value Range Status Comment   Specimen Description BLOOD R FOOT   Final    Special Requests BOTTLES DRAWN AEROBIC AND ANAEROBIC   Final    Culture  Setup Time 09/01/2011 04:01   Final    Culture NO GROWTH 5 DAYS   Final    Report Status 09/07/2011 FINAL   Final   STOOL CULTURE     Status: Normal (Preliminary result)   Collection Time   09/01/11  1:48 AM      Component Value Range Status Comment   Specimen Description STOOL   Final    Special Requests NONE   Final    Culture     Final    Value: SALMONELLA SPECIES     Note: Referred to [facility] in [place] Ser[place]. CRITICAL RESULT CALLED TO, READ BACK BY AND VERIFIED WITH: STEPHANIE RN@ 5:30pm [id number] BY VINCJ FAXED TO [facility] BETTY ROGERS [id number] BY LYONK   Report Status PENDING   Incomplete    Organism ID, Bacteria SALMONELLA SPECIES   Final   CLOSTRIDIUM DIFFICILE BY PCR     Status: Normal  Collection Time   09/01/11  1:49 AM      Component Value Range Status Comment   C difficile by pcr NEGATIVE  NEGATIVE Final   MRSA PCR SCREENING     Status: Normal   Collection Time   09/01/11  1:49 AM      Component Value Range Status Comment   MRSA by PCR NEGATIVE  NEGATIVE Final     Studies/Results: No results found.  Medications: Scheduled Meds:   . antiseptic oral rinse  15 mL Mouth Rinse q12n4p  . aspirin  325 mg Oral Daily  . chlorhexidine  15 mL Mouth Rinse BID  . levalbuterol  0.63 mg Nebulization Q6H  . metoprolol tartrate  50 mg Oral BID  . multivitamin with minerals  1 tablet Oral Daily  . potassium chloride   10 mEq Intravenous Q1 Hr x 4  . potassium chloride  10 mEq Intravenous Q1 Hr x 4  . potassium chloride  20 mEq Oral BID  . potassium chloride  20 mEq Oral BID  . sodium chloride  1,000 mL Intravenous Once  . sodium chloride  3 mL Intravenous Q12H  . DISCONTD: atorvastatin  40 mg Oral Daily  . DISCONTD: colesevelam  1,875 mg Oral BID WC  . DISCONTD: furosemide  80 mg Intravenous TID  . DISCONTD: omega-3 acid ethyl esters  1 g Oral BID  . DISCONTD: raloxifene  60 mg Oral Daily   Continuous Infusions:  PRN Meds:.acetaminophen, metoprolol   Assessment: Principal Problem:  *Septic shock Active Problems:  Colitis presumed infectious  Hyperlipidemia  Acute renal failure  Salmonella enteritis   Plan: 1. AKI- due to ATN from IV contrast exposure on 6/26. Another possibility is AIN due to meds (cipro, PPI- both d/c'd). Creatinine is improving down to 2.7, Excellent UOP with IV lasix. Per nephrology discontinue Lasix and monitor 2. Salmonella enterocolitis status post flexible sigmoidoscopy, biopsies taken pending at this time, ciprofloxacin and Flagyl have been discontinued, stool culture shows that the Salmonella is pansensitive to ampicillin, Rocephin, ciprofloxacin, Bactrim 3. Enterobacter UTI completed a 5 day course of ciprofloxacin 4. Pulm edema/hypoxemia- of bipap now, would continue. Stable on nasal cannula. Wheezing has improved. AMS - could be early uremia. Not on any sedating meds. Observe.  5. HTN- olmesartan 20/d, atenolo 50/d and lasix 40/d at home. All of these are on hold currently. 6. Hx COPD 7. Hx HL- on statin and Welchol, discontinue these for now 8. Hypotension/septic shock- neo weaned off 9. A fib uncontrolled ,add dialtiazem, continue metoprolol    LOS: 8 days   [facility] 09/08/2011, 7:45 AM

## 2011-09-08 NOTE — Progress Notes (Signed)
Clinical Social Work Department CLINICAL SOCIAL WORK PLACEMENT NOTE 09/08/2011  Patient:  [name] [name],[name] [name]  Account Number:  [id number] Admit date:  08/31/2011  Clinical Social Worker:  Orpah Greek  Date/time:  09/08/2011 09:55 AM  Clinical Social Work is seeking post-discharge placement for this patient at the following level of care:   SKILLED NURSING   (*CSW will update this form in Epic as items are completed)   09/08/2011  Patient/family provided with [facility] Department of Clinical Social Work's list of facilities offering this level of care within the geographic area requested by the patient (or if unable, by the patient's family).  09/08/2011  Patient/family informed of their freedom to choose among providers that offer the needed level of care, that participate in Medicare, Medicaid or managed care program needed by the patient, have an available bed and are willing to accept the patient.  09/08/2011  Patient/family informed of [facility]' ownership interest in [facility], as well as of the fact that they are under no obligation to receive care at this facility.  PASARR submitted to EDS on 09/08/2011 PASARR number received from EDS on 09/08/2011  FL2 transmitted to all facilities in geographic area requested by pt/family on  09/08/2011 FL2 transmitted to all facilities within larger geographic area on   Patient informed that his/her managed care company has contracts with or will negotiate with  certain facilities, including the following:     Patient/family informed of bed offers received:   Patient chooses bed at  Physician recommends and patient chooses bed at    Patient to be transferred to  on   Patient to be transferred to facility by   The following physician request were entered in Epic:   Additional Comments:    Unice Bailey, LCSW [facility] Clinical Social Worker cell #: [phone]

## 2011-09-08 NOTE — Progress Notes (Signed)
Speech Language Pathology Dysphagia Treatment Patient Details Name: [name] [name] MRN: [id number] DOB: 06-27-1923 Today's Date: 09/08/2011 Time: 7829-5621 SLP Time Calculation (min): 32 min  Assessment / Plan / Recommendation Clinical Impression  Much improved LOA today, pt actually self feeding when SLP places cracker, icecream spoon in hand.  Intermittent throat clearing continues but pt does have known h/o LPR and this may be source.  Pt reports swallow function to be currently at baseline.  SLP did note increased work of breathing with po intake.  Educated family and pt  to aspiration precautions.  Rec soft diet with thin liquid and strict aspiration precautions.      Diet Recommendation  Initiate / Change Diet: Dysphagia 3 (mechanical soft);Thin liquid    SLP Plan Continue with current plan of care   Pertinent Vitals/Pain Afebrile, decreased lung sounds   Swallowing Goals  SLP Swallowing Goals Swallow Study Goal #2 - Progress: Progressing toward goal  General Temperature Spikes Noted: Yes Respiratory Status: Supplemental O2 delivered via (comment) (2 liters) Behavior/Cognition: Alert;Cooperative Oral Cavity - Dentition: Adequate natural dentition  Oral Cavity - Oral Hygiene     Dysphagia Treatment Treatment focused on: Skilled observation of diet tolerance;Upgraded PO texture trials Treatment Methods/Modalities: Skilled observation Feeding: Needs assist (self fed cracker and icecream when placed in her hand) Liquids provided via: Straw Pharyngeal Phase Signs & Symptoms: Delayed throat clear (pt with known LPR) Type of cueing: Verbal Amount of cueing: Moderate (to take small single boluses)      Donavan Burnet, MS [facility] SLP [phone]

## 2011-09-09 ENCOUNTER — Inpatient Hospital Stay (HOSPITAL_COMMUNITY): Payer: Medicare Other

## 2011-09-09 LAB — CBC
HCT: 29 % — ABNORMAL LOW (ref 36.0–46.0)
Hemoglobin: 9.7 g/dL — ABNORMAL LOW (ref 12.0–15.0)
MCV: 92.7 fL (ref 78.0–100.0)
RDW: 14.2 % (ref 11.5–15.5)
WBC: 15.3 10*3/uL — ABNORMAL HIGH (ref 4.0–10.5)

## 2011-09-09 LAB — COMPREHENSIVE METABOLIC PANEL
ALT: 14 U/L (ref 0–35)
Albumin: 2.4 g/dL — ABNORMAL LOW (ref 3.5–5.2)
Alkaline Phosphatase: 55 U/L (ref 39–117)
BUN: 40 mg/dL — ABNORMAL HIGH (ref 6–23)
Calcium: 8.4 mg/dL (ref 8.4–10.5)
Potassium: 4.2 mEq/L (ref 3.5–5.1)
Sodium: 140 mEq/L (ref 135–145)
Total Protein: 5.5 g/dL — ABNORMAL LOW (ref 6.0–8.3)

## 2011-09-09 NOTE — Progress Notes (Signed)
Speech Language Pathology Dysphagia Treatment Patient Details Name: [name] [name] MRN: [id number] DOB: 27-Feb-1924 Today's Date: 09/09/2011 Time: 4098-1191 SLP Time Calculation (min): 21 min  Assessment / Plan / Recommendation Clinical Impression  Pt continues to demonstrate improvement with swallowing, LOA, ability to self feed today.  PT/OT helped pt to chair and pt was feeding herself breakfast.  Pt required mild verbal cues to take small bites/sips but did not demonstrate s/s of aspiration.  Intermittent throat clearing after swallow noted, which daughter had previously stated was chronic secondary to LPR.  SLP to follow up next week, one more time to assure tolerance and education completed.  Set up oral suction, provided tissues, and written precautions to pt.      Diet Recommendation  Continue with Current Diet: Thin liquid;Dysphagia 3 (mechanical soft)    SLP Plan     Pertinent Vitals/Pain Afebrile, decr lung sounds, intake of dinner 50% last pm   Swallowing Goals  SLP Swallowing Goals Swallow Study Goal #2 - Progress: Progressing toward goal  General Respiratory Status: Supplemental O2 delivered via (comment) (2 liters) Behavior/Cognition: Alert;Cooperative Oral Cavity - Dentition: Adequate natural dentition Patient Positioning: Upright in chair  Oral Cavity - Oral Hygiene   Oral cavity clean  Dysphagia Treatment Treatment focused on: Skilled observation of diet tolerance;Patient/family/caregiver education Family/Caregiver Educated: pt Treatment Methods/Modalities: Skilled observation Feeding: Able to feed self Liquids provided via: Straw Oral Phase Signs & Symptoms: Prolonged oral phase Pharyngeal Phase Signs & Symptoms: Delayed throat clear (daughter states pt has LPR and throat clear is chronic) Type of cueing: Verbal Amount of cueing: Minimal (to take small boluses)   Donavan Burnet, MS [facility] SLP [phone]

## 2011-09-09 NOTE — Evaluation (Signed)
Occupational Therapy Evaluation Patient Details Name: [name] [name] MRN: [id number] DOB: 1924-02-17 Today's Date: 09/09/2011 Time: 4098-1191 OT Time Calculation (min): 16 min  OT Assessment / Plan / Recommendation Clinical Impression  Pt is an 78 yo female who presents with septic shock. Skilled OT indicated to maximize I w/BADLs to min-mod A level in prep for d/c to next venue of care.    OT Assessment  Patient needs continued OT Services    Follow Up Recommendations  Skilled nursing facility;Inpatient Rehab    Barriers to Discharge Inaccessible home environment;Decreased caregiver support    Equipment Recommendations  Defer to next venue    Recommendations for Other Services Rehab consult  Frequency  Min 2X/week    Precautions / Restrictions Precautions Precautions: Fall Precaution Comments: monitor O2 sats Restrictions Weight Bearing Restrictions: No   Pertinent Vitals/Pain O2 saturation 96% on 2L following activity.    ADL  Grooming: Simulated;Set up Where Assessed - Grooming: Unsupported sitting Upper Body Bathing: Simulated;Minimal assistance Where Assessed - Upper Body Bathing: Unsupported sitting Lower Body Bathing: Simulated;+1 Total assistance Where Assessed - Lower Body Bathing: Supported sit to stand Upper Body Dressing: Simulated;Minimal assistance Where Assessed - Upper Body Dressing: Unsupported sitting Lower Body Dressing: Simulated;+1 Total assistance Where Assessed - Lower Body Dressing: Supported sit to stand Toilet Transfer: Simulated;+2 Total assistance Toilet Transfer: Patient Percentage: 60% Statistician Method: Sit to Barista: Other (comment) Nurse, children's) Toileting - Clothing Manipulation and Hygiene: Simulated;+1 Total assistance Where Assessed - Glass blower/designer Manipulation and Hygiene: Standing Equipment Used: Rolling walker;Gait belt ADL Comments: Pt fatigues very quickly. Max time and effort needed to completed  all functional tasks.    OT Diagnosis: Generalized weakness  OT Problem List: Decreased strength;Decreased cognition;Decreased activity tolerance;Decreased safety awareness;Decreased knowledge of use of DME or AE;Impaired balance (sitting and/or standing) OT Treatment Interventions: Self-care/ADL training;Therapeutic activities;DME and/or AE instruction;Patient/family education   OT Goals Acute Rehab OT Goals OT Goal Formulation: With patient Time For Goal Achievement: 09/23/11 Potential to Achieve Goals: Good ADL Goals Pt Will Perform Grooming: with min assist;Sitting, edge of bed;Sitting, chair;Unsupported ADL Goal: Grooming - Progress: Goal set today Pt Will Perform Upper Body Bathing: with set-up;Sitting, edge of bed;Sitting, chair;Unsupported ADL Goal: Upper Body Bathing - Progress: Goal set today Pt Will Perform Lower Body Bathing: with mod assist;Sit to stand from chair;Sit to stand from bed ADL Goal: Lower Body Bathing - Progress: Goal set today Pt Will Perform Upper Body Dressing: with set-up;Sitting, bed;Sitting, chair;Unsupported ADL Goal: Upper Body Dressing - Progress: Goal set today Pt Will Perform Lower Body Dressing: with mod assist;Sit to stand from bed;Sit to stand from chair ADL Goal: Lower Body Dressing - Progress: Goal set today Pt Will Transfer to Toilet: with min assist;3-in-1;Stand pivot transfer;Ambulation;Comfort height toilet ADL Goal: Toilet Transfer - Progress: Goal set today Pt Will Perform Toileting - Clothing Manipulation: with min assist;Standing ADL Goal: Toileting - Clothing Manipulation - Progress: Goal set today Pt Will Perform Toileting - Hygiene: with min assist;Sit to stand from 3-in-1/toilet ADL Goal: Toileting - Hygiene - Progress: Goal set today  Visit Information  Last OT Received On: 09/09/11 Assistance Needed: +2 PT/OT Co-Evaluation/Treatment: Yes    Subjective Data  Subjective: I would like to eat my breakfast sitting up.  Patient  Stated Goal: Not asked.   Prior Functioning  Home Living Lives With: Alone Available Help at Discharge: Family Type of Home: Apartment Bathroom Shower/Tub: Walk-in Soil scientist Toilet: Handicapped height Home Adaptive Equipment: Grab bars  around toilet;Grab bars in shower;Shower chair with back Prior Function Level of Independence: Independent Able to Take Stairs?: Yes Driving: Yes Vocation: Retired Musician: No difficulties Dominant Hand: Right    Cognition  Overall Cognitive Status: No family/caregiver present to determine baseline cognitive functioning Area of Impairment: Safety/judgement;Awareness of errors Arousal/Alertness: Awake/alert Orientation Level: Disoriented to;Time Behavior During Session: [facility] for tasks performed Current Attention Level: Alternating Following Commands: Follows multi-step commands inconsistently Safety/Judgement: Decreased awareness of safety precautions;Decreased safety judgement for tasks assessed;Impulsive Awareness of Errors: Assistance required to identify errors made;Assistance required to correct errors made Problem Solving: pt slow to process info, questions and safety cues.    Extremity/Trunk Assessment Right Upper Extremity Assessment RUE ROM/Strength/Tone: Deficits RUE ROM/Strength/Tone Deficits: ROM WFL Strength 4-/5 grossly Left Upper Extremity Assessment LUE ROM/Strength/Tone: Deficits LUE ROM/Strength/Tone Deficits: ROM WFL Strength 4-/5 grossly   Mobility Bed Mobility Bed Mobility: Supine to Sit;Sitting - Scoot to Edge of Bed Supine to Sit: 1: +2 Total assist Supine to Sit: Patient Percentage: 30% Sitting - Scoot to Edge of Bed: 1: +2 Total assist Sitting - Scoot to Edge of Bed: Patient Percentage: 50% Details for Bed Mobility Assistance: Physical A needed for trunk and LEs. max cues for hand placement and technique. Transfers Sit to Stand: 1: +2 Total assist;From bed;With upper extremity assist Sit to  Stand: Patient Percentage: 60% Stand to Sit: 1: +2 Total assist;To chair/3-in-1;With armrests;With upper extremity assist Stand to Sit: Patient Percentage: 60% Details for Transfer Assistance: Pt slightly impulsive, attempting to sit prematurely. Max cues for hand placement, technique.   Exercise    Balance Static Sitting Balance Static Sitting - Balance Support: Bilateral upper extremity supported;Feet supported Static Sitting - Level of Assistance: 4: Min assist  End of Session OT - End of Session Equipment Utilized During Treatment: Gait belt Activity Tolerance: Patient limited by fatigue Patient left: in chair;with call bell/phone within reach  GO     Chelesea Weiand A OTR/L [phone] 09/09/2011, 9:50 AM

## 2011-09-09 NOTE — Progress Notes (Signed)
CSW met with patient and family at bedside. CSW gave list of bed offers. Patient will discuss with family and get back to CSW.  [name] [name] C. [name] [name] MSW, LCSW [phone]

## 2011-09-09 NOTE — Progress Notes (Signed)
Subjective: Hemodynamically stable overnight Blood pressure soft but asymptomatic Urine output of 1475 cc  Objective: Vital signs in last 24 hours: Filed Vitals:   09/08/11 1755 09/08/11 1953 09/08/11 2055 09/09/11 0457  BP: 108/66  103/69 98/64  Pulse: 98  100 103  Temp: 98 F (36.7 C)  97.5 F (36.4 C) 98.1 F (36.7 C)  TempSrc: Oral  Oral Oral  Resp: 20  20 20   Height:      Weight:    83.689 kg (184 lb 8 oz)  SpO2: 95% 97% 96% 92%    Intake/Output Summary (Last 24 hours) at 09/09/11 0916 Last data filed at 09/09/11 0458  Gross per 24 hour  Intake   1450 ml  Output   1475 ml  Net    -25 ml    Weight change: -1.611 kg (-3 lb 8.8 oz)  Gen: on nasal cannula oxygen, much more alert. Sitting up on side of bed with assistance.  Skin: no rash, cyanosis  HEENT: EOMI, sclera anicteric, throat clear  Neck: + JVD, no bruits or LAN  Chest: clear  Heart: regular, no rub or gallop, no murmur  Abdomen: soft, obese, nontender, no HSM, no ascites  Ext: improved edema of extremities  Neuro: nonfocal      Lab Results: Results for orders placed during the hospital encounter of 08/31/11 (from the past 24 hour(s))  TSH     Status: Normal   Collection Time   09/08/11 10:44 AM      Component Value Range   TSH 1.110  0.350 - 4.500 uIU/mL  BLOOD GAS, ARTERIAL     Status: Abnormal   Collection Time   09/08/11 10:48 AM      Component Value Range   O2 Content 2.0     Delivery systems NASAL CANNULA     pH, Arterial 7.340 (*) 7.350 - 7.450   pCO2 arterial 39.8  35.0 - 45.0 mmHg   pO2, Arterial 71.0 (*) 80.0 - 100.0 mmHg   Bicarbonate 20.9  20.0 - 24.0 mEq/L   TCO2 19.5  0 - 100 mmol/L   Acid-base deficit 4.0 (*) 0.0 - 2.0 mmol/L   O2 Saturation 94.4     Patient temperature 98.6     Collection site LEFT RADIAL     Drawn by COLLECTED BY RT     Sample type ARTERIAL DRAW     Allens test (pass/fail) PASS  PASS  COMPREHENSIVE METABOLIC PANEL     Status: Abnormal   Collection Time   09/09/11  5:05 AM      Component Value Range   Sodium 140  135 - 145 mEq/L   Potassium 4.2  3.5 - 5.1 mEq/L   Chloride 110  96 - 112 mEq/L   CO2 23  19 - 32 mEq/L   Glucose, Bld 111 (*) 70 - 99 mg/dL   BUN 40 (*) 6 - 23 mg/dL   Creatinine, Ser 9.14 (*) 0.50 - 1.10 mg/dL   Calcium 8.4  8.4 - 78.2 mg/dL   Total Protein 5.5 (*) 6.0 - 8.3 g/dL   Albumin 2.4 (*) 3.5 - 5.2 g/dL   AST 15  0 - 37 U/L   ALT 14  0 - 35 U/L   Alkaline Phosphatase 55  39 - 117 U/L   Total Bilirubin 0.5  0.3 - 1.2 mg/dL   GFR calc non Af Amer 23 (*) >90 mL/min   GFR calc Af Amer 27 (*) >90 mL/min  CBC  Status: Abnormal   Collection Time   09/09/11  5:05 AM      Component Value Range   WBC 15.3 (*) 4.0 - 10.5 K/uL   RBC 3.13 (*) 3.87 - 5.11 MIL/uL   Hemoglobin 9.7 (*) 12.0 - 15.0 g/dL   HCT 16.1 (*) 09.6 - 04.5 %   MCV 92.7  78.0 - 100.0 fL   MCH 31.0  26.0 - 34.0 pg   MCHC 33.4  30.0 - 36.0 g/dL   RDW 40.9  81.1 - 91.4 %   Platelets 209  150 - 400 K/uL     Micro: Recent Results (from the past 240 hour(s))  URINE CULTURE     Status: Normal   Collection Time   08/30/11  9:53 PM      Component Value Range Status Comment   Specimen Description URINE, CATHETERIZED   Final    Special Requests NONE   Final    Culture  Setup Time 08/31/2011 02:49   Final    Colony Count >=100,000 COLONIES/ML   Final    Culture     Final    Value: ENTEROBACTER AEROGENES     ESCHERICHIA COLI   Report Status 09/03/2011 FINAL   Final    Organism ID, Bacteria ENTEROBACTER AEROGENES   Final    Organism ID, Bacteria ESCHERICHIA COLI   Final   URINE CULTURE     Status: Normal   Collection Time   08/31/11 10:37 PM      Component Value Range Status Comment   Specimen Description URINE, CATHETERIZED   Final    Special Requests NONE   Final    Culture  Setup Time 09/01/2011 04:03   Final    Colony Count >=100,000 COLONIES/ML   Final    Culture ENTEROBACTER AEROGENES   Final    Report Status 09/03/2011 FINAL   Final     Organism ID, Bacteria ENTEROBACTER AEROGENES   Final   CULTURE, BLOOD (ROUTINE X 2)     Status: Normal   Collection Time   09/01/11 12:10 AM      Component Value Range Status Comment   Specimen Description BLOOD LEFT ANTECUBITAL   Final    Special Requests BOTTLES DRAWN AEROBIC AND ANAEROBIC 5CC   Final    Culture  Setup Time 09/01/2011 04:01   Final    Culture NO GROWTH 5 DAYS   Final    Report Status 09/07/2011 FINAL   Final   CULTURE, BLOOD (ROUTINE X 2)     Status: Normal   Collection Time   09/01/11 12:15 AM      Component Value Range Status Comment   Specimen Description BLOOD R FOOT   Final    Special Requests BOTTLES DRAWN AEROBIC AND ANAEROBIC   Final    Culture  Setup Time 09/01/2011 04:01   Final    Culture NO GROWTH 5 DAYS   Final    Report Status 09/07/2011 FINAL   Final   STOOL CULTURE     Status: Normal (Preliminary result)   Collection Time   09/01/11  1:48 AM      Component Value Range Status Comment   Specimen Description STOOL   Final    Special Requests NONE   Final    Culture     Final    Value: SALMONELLA SPECIES     Note: Referred to [facility] in [place] for Ser[place]. CRITICAL RESULT CALLED TO, READ BACK BY  AND VERIFIED WITH: STEPHANIE RN@ 5:30pm [id number] BY VINCJ FAXED TO [facility] ROGERS [id number] BY LYONK   Report Status PENDING   Incomplete    Organism ID, Bacteria SALMONELLA SPECIES   Final   CLOSTRIDIUM DIFFICILE BY PCR     Status: Normal   Collection Time   09/01/11  1:49 AM      Component Value Range Status Comment   C difficile by pcr NEGATIVE  NEGATIVE Final   MRSA PCR SCREENING     Status: Normal   Collection Time   09/01/11  1:49 AM      Component Value Range Status Comment   MRSA by PCR NEGATIVE  NEGATIVE Final     Studies/Results: No results found.  Medications:  Scheduled Meds:   . antiseptic oral rinse  15 mL Mouth Rinse q12n4p  . aspirin  325 mg Oral Daily  . chlorhexidine  15 mL  Mouth Rinse BID  . diltiazem  240 mg Oral Daily  . levalbuterol  0.63 mg Nebulization Q6H WA  . metoprolol tartrate  50 mg Oral BID  . multivitamin with minerals  1 tablet Oral Daily  . potassium chloride  10 mEq Intravenous Q1 Hr x 4  . potassium chloride  40 mEq Oral BID  . sodium chloride  1,000 mL Intravenous Once  . sodium chloride  3 mL Intravenous Q12H  . DISCONTD: levalbuterol  0.63 mg Nebulization Q6H  . DISCONTD: potassium chloride  20 mEq Oral BID   Continuous Infusions:  PRN Meds:.acetaminophen, metoprolol, sodium chloride   Assessment: Principal Problem:  *Septic shock Active Problems:  Colitis presumed infectious  Hyperlipidemia  Acute renal failure  Salmonella enteritis   Plan: 1. AKI- due to ATN from IV contrast exposure on 6/26. Another possibility is AIN due to meds (cipro, PPI- both d/c'd). Creatinine is improving down to 1.87, Excellent UOP with IV lasix. Per nephrology discontinue Lasix and monitor 2. Salmonella enterocolitis status post flexible sigmoidoscopy, biopsies taken pending at this time, ciprofloxacin and Flagyl have been discontinued, stool culture shows that the Salmonella is pansensitive to ampicillin, Rocephin, ciprofloxacin, Bactrim 3. Enterobacter UTI completed a 5 day course of ciprofloxacin 4. Pulm edema/hypoxemia- of bipap now, would continue. Stable on nasal cannula. Wheezing has improved. AMS - could be early uremia. Not on any sedating meds. Observe.  5. HTN- olmesartan 20/d, atenolo 50/d and lasix 40/d at home. All of these are on hold currently. 6. Hx COPD 7. Hx HL- on statin and Welchol, discontinue these for now 8. Hypotension/septic shock- neo weaned off  9. A fib uncontrolled ,add dialtiazem, continue metoprolol Disposition dc to snf ,social work   LOS: 9 days   [facility] 09/09/2011, 9:16 AM

## 2011-09-09 NOTE — Progress Notes (Signed)
Physical Therapy Treatment Patient Details Name: [name] [name] MRN: [id number] DOB: [birth year] Today's Date: 09/09/2011 Time: 9147-8295 PT Time Calculation (min): 16 min  PT Assessment / Plan / Recommendation Comments on Treatment Session  Pt continues to require +2 assist, however progressing with most mobility.  Very hesitant about ambulating and wants to sit impulsively, however provided max encouragement to ambulate.     Follow Up Recommendations  Skilled nursing facility;Inpatient Rehab    Barriers to Discharge        Equipment Recommendations  Defer to next venue    Recommendations for Other Services Rehab consult  Frequency Min 3X/week   Plan Discharge plan remains appropriate;Frequency remains appropriate    Precautions / Restrictions Precautions Precautions: Fall Precaution Comments: monitor O2 sats Restrictions Weight Bearing Restrictions: No   Pertinent Vitals/Pain     Mobility  Bed Mobility Bed Mobility: Supine to Sit;Sitting - Scoot to Edge of Bed Supine to Sit: 1: +2 Total assist Supine to Sit: Patient Percentage: 30% Sitting - Scoot to Edge of Bed: 1: +2 Total assist Sitting - Scoot to Edge of Bed: Patient Percentage: 50% Details for Bed Mobility Assistance: Physical A needed for trunk and LEs. max cues for hand placement and technique. Transfers Transfers: Sit to Stand;Stand to Sit Sit to Stand: 1: +2 Total assist;From bed;With upper extremity assist Sit to Stand: Patient Percentage: 60% Stand to Sit: 1: +2 Total assist;To chair/3-in-1;With armrests;With upper extremity assist Stand to Sit: Patient Percentage: 60% Stand Pivot Transfers: 1: +2 Total assist Stand Pivot Transfers: Patient Percentage: 60% Details for Transfer Assistance: Pt slightly impulsive, attempting to sit prematurely. Max cues for hand placement, technique. Ambulation/Gait Ambulation/Gait Assistance: 1: +2 Total assist Ambulation/Gait: Patient Percentage: 60% Ambulation Distance  (Feet): 4 Feet Assistive device: Rolling walker Ambulation/Gait Assistance Details: Requires manual facilitation for weight shifting in order to advance LE (esp RLE).  Also requires cues for sequencing/technique with RW.  Pt very hesitant to take steps, provided max encouragement to ambulate.  Gait Pattern: Step-to pattern;Decreased stride length;Decreased step length - right;Decreased weight shift to right;Decreased weight shift to left;Narrow base of support;Trunk flexed    Exercises     PT Diagnosis:    PT Problem List:   PT Treatment Interventions:     PT Goals Acute Rehab PT Goals PT Goal Formulation: Patient unable to participate in goal setting Time For Goal Achievement: 09/18/11 Potential to Achieve Goals: Good Pt will go Supine/Side to Sit: Independently PT Goal: Supine/Side to Sit - Progress: Progressing toward goal Pt will go Sit to Stand: with modified independence PT Goal: Sit to Stand - Progress: Progressing toward goal Pt will go Stand to Sit: with modified independence PT Goal: Stand to Sit - Progress: Progressing toward goal Pt will Transfer Bed to Chair/Chair to Bed: with min assist PT Transfer Goal: Bed to Chair/Chair to Bed - Progress: Progressing toward goal Pt will Ambulate: >150 feet;with modified independence;with least restrictive assistive device PT Goal: Ambulate - Progress: Progressing toward goal  Visit Information  Last PT Received On: 09/09/11 Assistance Needed: +2    Subjective Data  Subjective: I'm doing alright Patient Stated Goal: None Stated.     Cognition  Overall Cognitive Status: No family/caregiver present to determine baseline cognitive functioning Area of Impairment: Safety/judgement;Awareness of errors Arousal/Alertness: Awake/alert Orientation Level: Disoriented to;Time Behavior During Session: [facility] for tasks performed Current Attention Level: Alternating Following Commands: Follows multi-step commands  inconsistently Safety/Judgement: Decreased awareness of safety precautions;Decreased safety judgement for tasks assessed;Impulsive Awareness of  Errors: Assistance required to identify errors made;Assistance required to correct errors made Problem Solving: pt slow to process info, questions and safety cues.    Balance  Static Sitting Balance Static Sitting - Balance Support: Bilateral upper extremity supported;Feet supported Static Sitting - Level of Assistance: 4: Min assist  End of Session PT - End of Session Equipment Utilized During Treatment: Oxygen Activity Tolerance: Patient limited by fatigue Patient left: in chair;with call bell/phone within reach Nurse Communication: Mobility status   GP     Page, Meribeth Mattes 09/09/2011, 9:37 AM

## 2011-09-10 LAB — BASIC METABOLIC PANEL
BUN: 33 mg/dL — ABNORMAL HIGH (ref 6–23)
CO2: 24 mEq/L (ref 19–32)
Calcium: 8.3 mg/dL — ABNORMAL LOW (ref 8.4–10.5)
Chloride: 108 mEq/L (ref 96–112)
Chloride: 107 mEq/L (ref 96–112)
Creatinine, Ser: 1.64 mg/dL — ABNORMAL HIGH (ref 0.50–1.10)
Creatinine, Ser: 1.57 mg/dL — ABNORMAL HIGH (ref 0.50–1.10)
GFR calc Af Amer: 32 mL/min — ABNORMAL LOW (ref >90)
GFR calc Af Amer: 34 mL/min — ABNORMAL LOW (ref >90)
GFR calc non Af Amer: 27 mL/min — ABNORMAL LOW (ref >90)
GFR calc non Af Amer: 29 mL/min — ABNORMAL LOW (ref >90)
Glucose, Bld: 118 mg/dL — ABNORMAL HIGH (ref 70–99)
Potassium: 4.1 mEq/L (ref 3.5–5.1)
Sodium: 137 mEq/L (ref 135–145)

## 2011-09-10 LAB — CBC
HCT: 28 % — ABNORMAL LOW (ref 36.0–46.0)
Platelets: 193 10*3/uL (ref 150–400)
RDW: 14.2 % (ref 11.5–15.5)
WBC: 12.9 10*3/uL — ABNORMAL HIGH (ref 4.0–10.5)

## 2011-09-10 MED ORDER — MOXIFLOXACIN HCL IN NACL 400 MG/250ML IV SOLN
400.0000 mg | INTRAVENOUS | Status: DC
  Administered 2011-09-10 – 2011-09-11 (×2): 400 mg via INTRAVENOUS
  Filled 2011-09-10 (×3): qty 250

## 2011-09-10 NOTE — Progress Notes (Signed)
CSW spoke with [facility] to inform facility that Pt has accepted bed offer and is waiting availability of female bed.   No weekend d/c is possible.  Pt to d/c Monday depending on bed availability.  Weekday CSW to f/u.  Leron Croak, LCSWA [employer] [phone]

## 2011-09-10 NOTE — Progress Notes (Signed)
Pt has met criteria for tele monitoring. MD notified and asked if pt could be D/Cd. New order given to do same.

## 2011-09-10 NOTE — Progress Notes (Addendum)
Subjective: Had a mild cough yesterday Chest x-ray suggestive of possible atelectasis versus infiltrate Patient has been afebrile Speech therapy has been following the patient   Objective: Vital signs in last 24 hours: Filed Vitals:   09/09/11 1440 09/09/11 2105 09/10/11 0603 09/10/11 0749  BP: 119/71 129/68 117/64   Pulse: 88 66 89   Temp: 97.7 F (36.5 C) 98.1 F (36.7 C) 97.9 F (36.6 C)   TempSrc: Oral Oral Oral   Resp: 21 20 20    Height:      Weight:   82.2 kg (181 lb 3.5 oz)   SpO2: 96% 97% 97% 96%    Intake/Output Summary (Last 24 hours) at 09/10/11 0906 Last data filed at 09/10/11 0826  Gross per 24 hour  Intake    600 ml  Output   1175 ml  Net   -575 ml    Weight change: -1.489 kg (-3 lb 4.5 oz)  Gen: on nasal cannula oxygen, much more alert. Sitting up on side of bed with assistance.  Skin: no rash, cyanosis  HEENT: EOMI, sclera anicteric, throat clear  Neck: + JVD, no bruits or LAN  Chest: clear  Heart: regular, no rub or gallop, no murmur  Abdomen: soft, obese, nontender, no HSM, no ascites  Ext: improved edema of extremities  Neuro: nonfocal      Lab Results: Results for orders placed during the hospital encounter of 08/31/11 (from the past 24 hour(s))  BASIC METABOLIC PANEL     Status: Abnormal   Collection Time   09/10/11  4:15 AM      Component Value Range   Sodium 139  135 - 145 mEq/L   Potassium 4.1  3.5 - 5.1 mEq/L   Chloride 108  96 - 112 mEq/L   CO2 23  19 - 32 mEq/L   Glucose, Bld 99  70 - 99 mg/dL   BUN 34 (*) 6 - 23 mg/dL   Creatinine, Ser 9.60 (*) 0.50 - 1.10 mg/dL   Calcium 8.1 (*) 8.4 - 10.5 mg/dL   GFR calc non Af Amer 27 (*) >90 mL/min   GFR calc Af Amer 32 (*) >90 mL/min  CBC     Status: Abnormal   Collection Time   09/10/11  4:15 AM      Component Value Range   WBC 12.9 (*) 4.0 - 10.5 K/uL   RBC 2.99 (*) 3.87 - 5.11 MIL/uL   Hemoglobin 9.2 (*) 12.0 - 15.0 g/dL   HCT 45.4 (*) 09.8 - 11.9 %   MCV 93.6  78.0 - 100.0 fL   MCH 30.8  26.0 - 34.0 pg   MCHC 32.9  30.0 - 36.0 g/dL   RDW 14.7  82.9 - 56.2 %   Platelets 193  150 - 400 K/uL    Micro: Recent Results (from the past 240 hour(s))  URINE CULTURE     Status: Normal   Collection Time   08/31/11 10:37 PM      Component Value Range Status Comment   Specimen Description URINE, CATHETERIZED   Final    Special Requests NONE   Final    Culture  Setup Time 09/01/2011 04:03   Final    Colony Count >=100,000 COLONIES/ML   Final    Culture ENTEROBACTER AEROGENES   Final    Report Status 09/03/2011 FINAL   Final    Organism ID, Bacteria ENTEROBACTER AEROGENES   Final   CULTURE, BLOOD (ROUTINE X 2)     Status:  Normal   Collection Time   09/01/11 12:10 AM      Component Value Range Status Comment   Specimen Description BLOOD LEFT ANTECUBITAL   Final    Special Requests BOTTLES DRAWN AEROBIC AND ANAEROBIC 5CC   Final    Culture  Setup Time 09/01/2011 04:01   Final    Culture NO GROWTH 5 DAYS   Final    Report Status 09/07/2011 FINAL   Final   CULTURE, BLOOD (ROUTINE X 2)     Status: Normal   Collection Time   09/01/11 12:15 AM      Component Value Range Status Comment   Specimen Description BLOOD R FOOT   Final    Special Requests BOTTLES DRAWN AEROBIC AND ANAEROBIC   Final    Culture  Setup Time 09/01/2011 04:01   Final    Culture NO GROWTH 5 DAYS   Final    Report Status 09/07/2011 FINAL   Final   STOOL CULTURE     Status: Normal (Preliminary result)   Collection Time   09/01/11  1:48 AM      Component Value Range Status Comment   Specimen Description STOOL   Final    Special Requests NONE   Final    Culture     Final    Value: SALMONELLA SPECIES     Note: Referred to [facility] in [place], [place] for Ser[place]. CRITICAL RESULT CALLED TO, READ BACK BY AND VERIFIED WITH: STEPHANIE RN@ 5:30pm [id number] BY VINCJ FAXED TO [facility] ROGERS [id number] BY LYONK   Report Status PENDING   Incomplete    Organism ID,  Bacteria SALMONELLA SPECIES   Final   CLOSTRIDIUM DIFFICILE BY PCR     Status: Normal   Collection Time   09/01/11  1:49 AM      Component Value Range Status Comment   C difficile by pcr NEGATIVE  NEGATIVE Final   MRSA PCR SCREENING     Status: Normal   Collection Time   09/01/11  1:49 AM      Component Value Range Status Comment   MRSA by PCR NEGATIVE  NEGATIVE Final     Studies/Results: Dg Chest 1 View  09/09/2011  *RADIOLOGY REPORT*  Clinical Data: Cough.  Possible aspiration.  CHEST - 1 VIEW  Comparison: 09/06/2011.  09/03/2011.  Findings: Artifact overlies chest.  Right internal jugular central line has its tip in the SVC just above the right atrium.  There is patchy density in both lower lobes left worse than right.  This could be atelectasis or pneumonia.  Findings are similar to the study of 06/29.  Upper lobes remain clear.  IMPRESSION: Patchy atelectasis and/or infiltrate at both lung bases left more than right.  Similar appearance to the study of 06/29.  Original Report Authenticated By: Thomasenia Sales, M.D.    Medications: Scheduled Meds:   . antiseptic oral rinse  15 mL Mouth Rinse q12n4p  . aspirin  325 mg Oral Daily  . chlorhexidine  15 mL Mouth Rinse BID  . diltiazem  240 mg Oral Daily  . levalbuterol  0.63 mg Nebulization Q6H WA  . metoprolol tartrate  50 mg Oral BID  . moxifloxacin  400 mg Intravenous Q24H  . multivitamin with minerals  1 tablet Oral Daily  . sodium chloride  1,000 mL Intravenous Once  . sodium chloride  3 mL Intravenous Q12H   Continuous Infusions:  PRN Meds:.acetaminophen, metoprolol, sodium chloride  Assessment: Principal Problem:  *Septic shock Active Problems:  Colitis presumed infectious  Hyperlipidemia  Acute renal failure  Salmonella enteritis   Plan: #1 acute kidney injury continues to improve #2 possible pneumonia. Given the patient's elevated white count and coughing. It is quite possible the patient has developed a  pneumonia, given side effects of medications and concern about interstitial nephritis. Will simply use Avelox for 5 days #3 enterococcal UTI completed a five-day course of ciprofloxacin #4 hypoxemia/pulmonary edema resolved, stable on nasal cannula #5 hypertension hemodynamically stable, holding olmesartan, atenolol, Lasix #6 atrial fibrillation on diltiazem metoprolol stable, not a candidate for long-term anticoagulation given multiple comorbidities and fall risk #7 disposition anticipate discharge on Monday to SNF #8 dysphagia H. therapy following continue dysphagia 3 diet   LOS: 10 days   Toney Lizaola 09/10/2011, 9:06 AM

## 2011-09-10 NOTE — Progress Notes (Signed)
Occupational Therapy Treatment Patient Details Name: AIVY AKTER MRN: [id number] DOB: 03-29-1923 Today's Date: 09/10/2011 Time: 1015-1029 OT Time Calculation (min): 14 min  OT Assessment / Plan / Recommendation    Follow Up Recommendations  Skilled nursing facility                Plan Discharge plan remains appropriate    Precautions / Restrictions Precautions Precautions: Fall       ADL  Grooming: Performed;Brushing hair;Set up;Minimal assistance (encouraged getting to back of hair and using both hands) Where Assessed - Grooming: Supported sitting Upper Body Bathing: Simulated;Maximal assistance Where Assessed - Upper Body Bathing: Unsupported sitting Lower Body Bathing: Simulated;Maximal assistance Where Assessed - Lower Body Bathing: Supported sit to stand Upper Body Dressing: Simulated;Moderate assistance Where Assessed - Upper Body Dressing: Supported sit to stand Lower Body Dressing: Maximal assistance Toilet Transfer: Simulated;Maximal assistance;Other (comment) (bed to chair) Toilet Transfer Method: Sit to stand;Stand pivot Toileting - Clothing Manipulation and Hygiene: Simulated;Maximal assistance Where Assessed - Toileting Clothing Manipulation and Hygiene: Standing Transfers/Ambulation Related to ADLs: Pt very unsteadt on feel. REccoemended nurse use BSC for toileting, rather than walking to BR.      OT Goals ADL Goals ADL Goal: Grooming - Progress: Progressing toward goals ADL Goal: Toilet Transfer - Progress: Progressing toward goals  Visit Information  Last OT Received On: 09/10/11    Subjective Data  Subjective: i would like to get up as my foley is taken out      Cognition  Overall Cognitive Status: Appears within functional limits for tasks assessed/performed    Mobility Bed Mobility Bed Mobility: Supine to Sit Supine to Sit: 2: Max assist Sitting - Scoot to [place] of Bed: 2: Max assist Transfers Transfers: Sit to Stand;Stand to [employer] to  Stand: 2: Max assist;With upper extremity assist Stand to Sit: With upper extremity assist;2: Max assist         End of Session OT - End of Session Activity Tolerance: Patient tolerated treatment well Patient left: in chair Nurse Communication: Mobility status       Alba Cory 09/10/2011, 10:36 AM

## 2011-09-11 MED ORDER — IPRATROPIUM BROMIDE 0.02 % IN SOLN
0.5000 mg | Freq: Four times a day (QID) | RESPIRATORY_TRACT | Status: DC
  Administered 2011-09-11 – 2011-09-13 (×7): 0.5 mg via RESPIRATORY_TRACT
  Filled 2011-09-11 (×7): qty 2.5

## 2011-09-11 MED ORDER — IPRATROPIUM BROMIDE 0.02 % IN SOLN: 0.5000 mg | Freq: Four times a day (QID) | RESPIRATORY_TRACT | Status: DC

## 2011-09-11 MED ORDER — FLUTICASONE-SALMETEROL 250-50 MCG/DOSE IN AEPB
1.0000 | INHALATION_SPRAY | Freq: Two times a day (BID) | RESPIRATORY_TRACT | Status: DC
  Administered 2011-09-11 – 2011-09-15 (×9): 1 via RESPIRATORY_TRACT
  Filled 2011-09-11: qty 14

## 2011-09-11 MED ORDER — PREDNISONE 20 MG PO TABS
20.0000 mg | ORAL_TABLET | Freq: Every day | ORAL | Status: DC
  Administered 2011-09-11 – 2011-09-12 (×2): 20 mg via ORAL
  Filled 2011-09-11 (×3): qty 1

## 2011-09-11 NOTE — Progress Notes (Signed)
Subjective: No complaints Resting comfortably  Objective: Vital signs in last 24 hours: Filed Vitals:   09/10/11 1944 09/10/11 2116 09/11/11 0445 09/11/11 0900  BP:  119/69 125/73   Pulse:  100 104   Temp:  97.8 F (36.6 C) 97.4 F (36.3 C)   TempSrc:  Oral Oral   Resp:  20 20   Height:      Weight:   84.006 kg (185 lb 3.2 oz)   SpO2: 93% 95% 97% 92%    Intake/Output Summary (Last 24 hours) at 09/11/11 0905 Last data filed at 09/11/11 0800  Gross per 24 hour  Intake 965.55 ml  Output    800 ml  Net 165.55 ml    Weight change: 1.806 kg (3 lb 15.7 oz)   Gen: on nasal cannula oxygen, much more alert. Sitting up on side of bed with assistance.  Skin: no rash, cyanosis  HEENT: EOMI, sclera anicteric, throat clear  Neck: + JVD, no bruits or LAN  Chest: clear  Heart: regular, no rub or gallop, no murmur  Abdomen: soft, obese, nontender, no HSM, no ascites  Ext: improved edema of extremities  Neuro: nonfocal   Lab Results: Results for orders placed during the hospital encounter of 08/31/11 (from the past 24 hour(s))  BASIC METABOLIC PANEL     Status: Abnormal   Collection Time   09/10/11 10:20 AM      Component Value Range   Sodium 137  135 - 145 mEq/L   Potassium 4.1  3.5 - 5.1 mEq/L   Chloride 107  96 - 112 mEq/L   CO2 24  19 - 32 mEq/L   Glucose, Bld 118 (*) 70 - 99 mg/dL   BUN 33 (*) 6 - 23 mg/dL   Creatinine, Ser 5.78 (*) 0.50 - 1.10 mg/dL   Calcium 8.3 (*) 8.4 - 10.5 mg/dL   GFR calc non Af Amer 29 (*) >90 mL/min   GFR calc Af Amer 34 (*) >90 mL/min     Micro: No results found for this or any previous visit (from the past 240 hour(s)).  Studies/Results: Dg Chest 1 View  09/09/2011  *RADIOLOGY REPORT*  Clinical Data: Cough.  Possible aspiration.  CHEST - 1 VIEW  Comparison: 09/06/2011.  09/03/2011.  Findings: Artifact overlies chest.  Right internal jugular central line has its tip in the SVC just above the right atrium.  There is patchy density in both  lower lobes left worse than right.  This could be atelectasis or pneumonia.  Findings are similar to the study of 06/29.  Upper lobes remain clear.  IMPRESSION: Patchy atelectasis and/or infiltrate at both lung bases left more than right.  Similar appearance to the study of 06/29.  Original Report Authenticated By: Thomasenia Sales, M.D.    Medications: Scheduled Meds:   . antiseptic oral rinse  15 mL Mouth Rinse q12n4p  . aspirin  325 mg Oral Daily  . chlorhexidine  15 mL Mouth Rinse BID  . diltiazem  240 mg Oral Daily  . levalbuterol  0.63 mg Nebulization Q6H WA  . metoprolol tartrate  50 mg Oral BID  . moxifloxacin  400 mg Intravenous Q24H  . multivitamin with minerals  1 tablet Oral Daily  . sodium chloride  1,000 mL Intravenous Once  . sodium chloride  3 mL Intravenous Q12H   Continuous Infusions:  PRN Meds:.acetaminophen, metoprolol, sodium chloride   Assessment: Principal Problem:  *Septic shock Active Problems:  Colitis presumed infectious  Hyperlipidemia  Acute renal failure  Salmonella enteritis   Plan:  #1 acute kidney injury continues to improve  #2 possible pneumonia. Given the patient's elevated white count and coughing. It is quite possible the patient has developed a pneumonia, given side effects of medications and concern about interstitial nephritis. Will simply use Avelox for 5 days , continue #3 enterococcal UTI completed a five-day course of ciprofloxacin , currently stable #4 hypoxemia/pulmonary edema resolved, stable on nasal cannula , continue to wean oxygen #5 hypertension hemodynamically stable, holding olmesartan, atenolol, Lasix  #6 atrial fibrillation on diltiazem metoprolol stable, not a candidate for long-term anticoagulation given multiple comorbidities and fall risk , discontinue telemetry #7 disposition anticipate discharge on Monday to SNF  #8 dysphagia H. therapy following continue dysphagia 3 diet   LOS: 11 days   [name] [name] 09/11/2011,  9:05 AM

## 2011-09-11 NOTE — Progress Notes (Signed)
O2 sat decreased to 87% on R/A after amb. A short distance.  O2 placed back on at 2.5L N/C with sats increasing back to 93-94%

## 2011-09-12 ENCOUNTER — Inpatient Hospital Stay (HOSPITAL_COMMUNITY): Payer: Medicare Other

## 2011-09-12 LAB — CBC
HCT: 28.4 % — ABNORMAL LOW (ref 36.0–46.0)
Hemoglobin: 9.4 g/dL — ABNORMAL LOW (ref 12.0–15.0)
RBC: 3.01 MIL/uL — ABNORMAL LOW (ref 3.87–5.11)
WBC: 27.7 10*3/uL — ABNORMAL HIGH (ref 4.0–10.5)

## 2011-09-12 MED ORDER — ENOXAPARIN SODIUM 40 MG/0.4ML ~~LOC~~ SOLN
40.0000 mg | SUBCUTANEOUS | Status: DC
  Administered 2011-09-12 – 2011-09-14 (×3): 40 mg via SUBCUTANEOUS
  Filled 2011-09-12 (×4): qty 0.4

## 2011-09-12 MED ORDER — METRONIDAZOLE 500 MG PO TABS
500.0000 mg | ORAL_TABLET | Freq: Three times a day (TID) | ORAL | Status: DC
  Administered 2011-09-12 – 2011-09-15 (×10): 500 mg via ORAL
  Filled 2011-09-12 (×12): qty 1

## 2011-09-12 MED ORDER — SACCHAROMYCES BOULARDII 250 MG PO CAPS
250.0000 mg | ORAL_CAPSULE | Freq: Two times a day (BID) | ORAL | Status: DC
  Administered 2011-09-12 – 2011-09-15 (×7): 250 mg via ORAL
  Filled 2011-09-12 (×8): qty 1

## 2011-09-12 MED ORDER — LEVOFLOXACIN IN D5W 750 MG/150ML IV SOLN
750.0000 mg | INTRAVENOUS | Status: DC
  Administered 2011-09-12 – 2011-09-14 (×2): 750 mg via INTRAVENOUS
  Filled 2011-09-12 (×2): qty 150

## 2011-09-12 MED ORDER — PREDNISONE 20 MG PO TABS
40.0000 mg | ORAL_TABLET | Freq: Every day | ORAL | Status: DC
  Administered 2011-09-13 – 2011-09-15 (×3): 40 mg via ORAL
  Filled 2011-09-12 (×4): qty 2

## 2011-09-12 NOTE — Progress Notes (Signed)
Physical Therapy Treatment Patient Details Name: [name] [name] MRN: [id number] DOB: 31-Jan-1924 Today's Date: 09/12/2011 Time: 0802-0828 PT Time Calculation (min): 26 min  PT Assessment / Plan / Recommendation Comments on Treatment Session  pt presents with Septic Shock.  pt moving much better today and very motivated to improve mobility.  pt and Dtr noting that they anticipate D/C to SNF for rehab later today.      Follow Up Recommendations  Skilled nursing facility    Barriers to Discharge        Equipment Recommendations  Defer to next venue    Recommendations for Other Services    Frequency Min 3X/week   Plan Discharge plan remains appropriate;Frequency remains appropriate    Precautions / Restrictions Precautions Precautions: Fall Precaution Comments: monitor O2 sats Restrictions Weight Bearing Restrictions: No   Pertinent Vitals/Pain Denies pain, but notes stiffness in L hip.      Mobility  Bed Mobility Bed Mobility: Supine to Sit;Sitting - Scoot to Edge of Bed Supine to Sit: 3: Mod assist;With rails Sitting - Scoot to Edge of Bed: 2: Max assist Details for Bed Mobility Assistance: A needed for trunk and cueing for safe technique.   Transfers Transfers: Sit to Stand;Stand to Sit;Stand Pivot Transfers Sit to Stand: 3: Mod assist;With upper extremity assist;From bed Stand to Sit: 3: Mod assist;With upper extremity assist;With armrests;To chair/3-in-1 Stand Pivot Transfers: 3: Mod assist;With armrests Details for Transfer Assistance: pt able to SPT with ModA and no AD.  Mildly impulsive as pt starting to initiate transfer and didn't wait for PT to grab RW.  Cues for use of UEs and to get closer to chair prior to sitting.   Ambulation/Gait Ambulation/Gait Assistance: 4: Min assist Ambulation Distance (Feet): 35 Feet Assistive device: Rolling walker Ambulation/Gait Assistance Details: cues for use of RW, positioning in RW, upright posture.   Gait Pattern: Step-through  pattern;Decreased stride length;Trunk flexed Stairs: No Wheelchair Mobility Wheelchair Mobility: No    Exercises     PT Diagnosis:    PT Problem List:   PT Treatment Interventions:     PT Goals Acute Rehab PT Goals Time For Goal Achievement: 09/18/11 PT Goal: Supine/Side to Sit - Progress: Progressing toward goal PT Goal: Sit to Stand - Progress: Progressing toward goal PT Goal: Stand to Sit - Progress: Progressing toward goal PT Transfer Goal: Bed to Chair/Chair to Bed - Progress: Progressing toward goal PT Goal: Ambulate - Progress: Progressing toward goal  Visit Information  Last PT Received On: 09/12/11 Assistance Needed: +2 (helpful, but not necessary.  )    Subjective Data  Subjective: I really have to use the bathroom.     Cognition  Overall Cognitive Status: Impaired Area of Impairment: Safety/judgement;Awareness of deficits;Problem solving Arousal/Alertness: Awake/alert Orientation Level: Disoriented to;Time Behavior During Session: WFL for tasks performed Safety/Judgement: Decreased safety judgement for tasks assessed;Impulsive;Decreased awareness of need for assistance Awareness of Deficits: pt unaware of balance deficits and thinks she would be fine at home by herself.   Problem Solving: pt slow to respond and at times needs repeated question or cue.      Balance  Balance Balance Assessed: Yes Static Standing Balance Static Standing - Balance Support: Bilateral upper extremity supported;During functional activity Static Standing - Level of Assistance: 4: Min assist Static Standing - Comment/# of Minutes: pt able to stand with Bil UE supported during peri hygiene.    End of Session PT - End of Session Equipment Utilized During Treatment: Gait belt Activity Tolerance:  Patient tolerated treatment well Patient left: in chair;with call bell/phone within reach;with family/visitor present Nurse Communication: Mobility status   GP     Sunny Schlein,   [phone] 09/12/2011, 8:35 AM

## 2011-09-12 NOTE — Progress Notes (Signed)
Speech Language Pathology Dysphagia Treatment Patient Details Name: [name] [name] MRN: [id number] DOB: June 24, 1923 Today's Date: 09/12/2011 Time: 4098-1191 SLP Time Calculation (min): 35 min  Assessment / Plan / Recommendation Clinical Impression  Pt's swallow appears to be the same as when seen on 09/09/11, but note results of her CXR with ongoing right lung collapse - suspicious for pna.  Pt has chronically been clearing her throat but it does appear more prominent during po intake.  Daughter has reported pt has known LPR  during initial evalaution.    Given  CXR continuing to be worrisome for pna, MBS may be helpful to rule out overt aspiration.  Although SLP does suspect this pt may episodically aspirate given her COPD, known PUD, hiatal hernia & LPR.    MD please order MBS if you agree.      Diet Recommendation  Continue with Current Diet: Dysphagia 3 (mechanical soft);Thin liquid (consider MBS to r/o overt aspiration)    SLP Plan MBS   Pertinent Vitals/Pain WBC increasing, 2 liters oxygen, afebrile, congested lung sounds   Swallowing Goals  SLP Swallowing Goals Swallow Study Goal #2 - Progress: Progressing toward goal Goal #3: Pt, family and MD will determine if desire for pt to have MBS study to instrumentally evaluate swallow function and rule out overt silent aspiration.   General Temperature Spikes Noted: No (but WBC elevated) Respiratory Status: Supplemental O2 delivered via (comment) (2 liters) Behavior/Cognition: Alert;Cooperative Oral Cavity - Dentition: Adequate natural dentition Patient Positioning: Upright in bed  Dysphagia Treatment Treatment focused on: Skilled observation of diet tolerance;Patient/family/caregiver education Family/Caregiver Educated: pt Treatment Methods/Modalities: Skilled observation Patient observed directly with PO's: Yes Type of PO's observed: Dysphagia 3 (soft);Thin liquids Feeding: Able to feed self Oral Phase Signs & Symptoms: Prolonged  mastication Pharyngeal Phase Signs & Symptoms: Delayed throat clear;Delayed cough (pt with known LPR), throat clearing most noted with thin liquids Type of cueing: Verbal Amount of cueing: Moderate (to stop intake if dyspneic and consume water with meals)       Donavan Burnet, MS [facility] SLP [phone]

## 2011-09-12 NOTE — Progress Notes (Signed)
Subjective: Per hour and had some diarrhea overnight Patient had 4 BMs this morning.  Objective: Vital signs in last 24 hours: Filed Vitals:   09/11/11 2018 09/11/11 2143 09/12/11 0520 09/12/11 0845  BP:  126/68 127/69   Pulse:  109 106   Temp:  97.9 F (36.6 C) 98.1 F (36.7 C)   TempSrc:  Oral Oral   Resp:  16 16   Height:      Weight:   84.1 kg (185 lb 6.5 oz)   SpO2: 92% 94% 88% 87%    Intake/Output Summary (Last 24 hours) at 09/12/11 0851 Last data filed at 09/11/11 2214  Gross per 24 hour  Intake    970 ml  Output    602 ml  Net    368 ml    Weight change: 0.094 kg (3.3 oz)  Gen: on nasal cannula oxygen, much more alert. Sitting up on side of bed with assistance.  Skin: no rash, cyanosis  HEENT: EOMI, sclera anicteric, throat clear  Neck: + JVD, no bruits or LAN  Chest: clear  Heart: regular, no rub or gallop, no murmur  Abdomen: soft, obese, nontender, no HSM, no ascites  Ext: improved edema of extremities  Neuro: nonfocal    Lab Results: Results for orders placed during the hospital encounter of 08/31/11 (from the past 24 hour(s))  CBC     Status: Abnormal   Collection Time   09/12/11  5:05 AM      Component Value Range   WBC 27.7 (*) 4.0 - 10.5 K/uL   RBC 3.01 (*) 3.87 - 5.11 MIL/uL   Hemoglobin 9.4 (*) 12.0 - 15.0 g/dL   HCT 13.0 (*) 86.5 - 78.4 %   MCV 94.4  78.0 - 100.0 fL   MCH 31.2  26.0 - 34.0 pg   MCHC 33.1  30.0 - 36.0 g/dL   RDW 69.6  29.5 - 28.4 %   Platelets 257  150 - 400 K/uL     Micro: No results found for this or any previous visit (from the past 240 hour(s)).  Studies/Results: No results found.  Medications:  Scheduled Meds:   . antiseptic oral rinse  15 mL Mouth Rinse q12n4p  . aspirin  325 mg Oral Daily  . chlorhexidine  15 mL Mouth Rinse BID  . diltiazem  240 mg Oral Daily  . Fluticasone-Salmeterol  1 puff Inhalation BID  . ipratropium  0.5 mg Nebulization Q6H  . levalbuterol  0.63 mg Nebulization Q6H WA  .  metoprolol tartrate  50 mg Oral BID  . metroNIDAZOLE  500 mg Oral Q8H  . multivitamin with minerals  1 tablet Oral Daily  . predniSONE  20 mg Oral Q breakfast  . sodium chloride  1,000 mL Intravenous Once  . sodium chloride  3 mL Intravenous Q12H  . DISCONTD: ipratropium  0.5 mg Nebulization QID  . DISCONTD: moxifloxacin  400 mg Intravenous Q24H   Continuous Infusions:  PRN Meds:.acetaminophen, metoprolol, sodium chloride   Assessment: Principal Problem:  *Septic shock Active Problems:  Colitis presumed infectious  Hyperlipidemia  Acute renal failure  Salmonella enteritis   Plan: #1 acute kidney injury continues to improve   #2 possible pneumonia Will discontinue Avelox today because of patient diarrhea, concern for C. difficile, this was discussed with the patient and family. It is quite possible the patient has developed a pneumonia, given side effects of medications and concern about interstitial nephritis. She is on received 3 days of  Avelox   #  3 enterococcal UTI completed a five-day course of ciprofloxacin , currently stable   #4 hypoxemia/pulmonary edema/wheezing resolved, stable on nasal cannula , continue to wean oxygen , improved after diuresing as well as initiation of Advair/Atrovent/by mouth prednisone  #5 hypertension hemodynamically stable, holding olmesartan, atenolol, Lasix  #6 atrial fibrillation on diltiazem metoprolol stable, not a candidate for long-term anticoagulation given multiple comorbidities and fall risk , discontinue telemetry   #7 disposition anticipate discharge tomorrow to SNF, if C. difficile is ruled out  #8 dysphagia H. therapy following continue dysphagia 3 diet    #9 leukocytosis patient's elevated white count could be secondary to C. difficile versus initiation of steroids for her wheezing,  #10 diarrhea C. difficile PCR pending at this time, empiric Flagyl to see the diarrhea improves   LOS: 12 days   [facility] 09/12/2011, 8:51  AM

## 2011-09-13 ENCOUNTER — Inpatient Hospital Stay (HOSPITAL_COMMUNITY): Payer: Medicare Other

## 2011-09-13 LAB — CBC
HCT: 27.2 % — ABNORMAL LOW (ref 36.0–46.0)
Hemoglobin: 8.8 g/dL — ABNORMAL LOW (ref 12.0–15.0)
MCV: 95.1 fL (ref 78.0–100.0)
RBC: 2.86 MIL/uL — ABNORMAL LOW (ref 3.87–5.11)
WBC: 16.6 10*3/uL — ABNORMAL HIGH (ref 4.0–10.5)

## 2011-09-13 LAB — BASIC METABOLIC PANEL
BUN: 24 mg/dL — ABNORMAL HIGH (ref 6–23)
CO2: 23 mEq/L (ref 19–32)
Chloride: 105 mEq/L (ref 96–112)
Creatinine, Ser: 1.28 mg/dL — ABNORMAL HIGH (ref 0.50–1.10)
GFR calc Af Amer: 43 mL/min — ABNORMAL LOW (ref >90)
Potassium: 4.4 mEq/L (ref 3.5–5.1)

## 2011-09-13 MED ORDER — IPRATROPIUM BROMIDE 0.02 % IN SOLN
0.5000 mg | Freq: Four times a day (QID) | RESPIRATORY_TRACT | Status: DC
  Administered 2011-09-13 – 2011-09-15 (×8): 0.5 mg via RESPIRATORY_TRACT
  Filled 2011-09-13 (×9): qty 2.5

## 2011-09-13 MED ORDER — FUROSEMIDE 40 MG PO TABS
40.0000 mg | ORAL_TABLET | Freq: Every day | ORAL | Status: DC
  Administered 2011-09-13 – 2011-09-15 (×3): 40 mg via ORAL
  Filled 2011-09-13 (×3): qty 1

## 2011-09-13 MED ORDER — LEVALBUTEROL HCL 0.63 MG/3ML IN NEBU
0.6300 mg | INHALATION_SOLUTION | Freq: Four times a day (QID) | RESPIRATORY_TRACT | Status: DC | PRN
  Filled 2011-09-13: qty 3

## 2011-09-13 NOTE — Progress Notes (Signed)
Subjective: Diarrhea has improved Still somewhat short of breath  78 yo female admitted on 08/31/2011 with weakness, waterry diarrhea, nausea/vomiting, lethargy, hypotension. Found to have diffuse colitis on CT abd/pelvis. PCCM consulted 6/27 due to hypotension and concern for sepsis. Stool reported to be positive for Salmonella. Also with Enterobacter in urine.  PMH of HTN, HLD, PUD, COPD   Patient was transferred to the ICU for sepsis/enterocolitis/developed hypotension requiring vasopressors. Had a right-sided central venous catheter placed on the 28th. Subsequently developed atrial fibrillation with rapid ventricular response. She also developed acute renal failure with a creatinine of 3.25. Nephrology was consulted. Her renal failure was thought to be a combination of ATN from IV contrast exposure versus acute interstitial nephritis due to ciprofloxacin/Protonix. Creatinine has improved steadily.  Patient also developed pulmonary edema/profound hypoxemia/metabolic acidosis requiring BiPAP support. This resolved and the patient was moved out of step down 4 days ago  Since the patient has been on the floor she has improved steadily however she was noted to be more short of breath. And wheezing. Chest x-ray confirmed bilateral pleural effusions possible right lung base collapse/consolidation  Objective: Vital signs in last 24 hours: Filed Vitals:   09/13/11 0500 09/13/11 0515 09/13/11 0907 09/13/11 1056  BP:  110/74  110/72  Pulse:  116  132  Temp:  98 F (36.7 C)    TempSrc:  Oral    Resp:  16    Height:      Weight: 84.3 kg (185 lb 13.6 oz)     SpO2:  94% 93%     Intake/Output Summary (Last 24 hours) at 09/13/11 1110 Last data filed at 09/13/11 0900  Gross per 24 hour  Intake    950 ml  Output    100 ml  Net    850 ml    Weight change: 0.2 kg (7.1 oz)  Gen: on nasal cannula oxygen, much more alert. Sitting up on side of bed with assistance.  Skin: no rash, cyanosis  HEENT:  EOMI, sclera anicteric, throat clear  Neck: + JVD, no bruits or LAN  Chest: clear  Heart: regular, no rub or gallop, no murmur  Abdomen: soft, obese, nontender, no HSM, no ascites  Ext: improved edema of extremities  Neuro: nonfocal      Lab Results: Results for orders placed during the hospital encounter of 08/31/11 (from the past 24 hour(s))  CBC     Status: Abnormal   Collection Time   09/13/11  3:00 AM      Component Value Range   WBC 16.6 (*) 4.0 - 10.5 K/uL   RBC 2.86 (*) 3.87 - 5.11 MIL/uL   Hemoglobin 8.8 (*) 12.0 - 15.0 g/dL   HCT 19.1 (*) 47.8 - 29.5 %   MCV 95.1  78.0 - 100.0 fL   MCH 30.8  26.0 - 34.0 pg   MCHC 32.4  30.0 - 36.0 g/dL   RDW 62.1  30.8 - 65.7 %   Platelets 266  150 - 400 K/uL  BASIC METABOLIC PANEL     Status: Abnormal   Collection Time   09/13/11  3:00 AM      Component Value Range   Sodium 137  135 - 145 mEq/L   Potassium 4.4  3.5 - 5.1 mEq/L   Chloride 105  96 - 112 mEq/L   CO2 23  19 - 32 mEq/L   Glucose, Bld 88  70 - 99 mg/dL   BUN 24 (*) 6 - 23 mg/dL  Creatinine, Ser 1.28 (*) 0.50 - 1.10 mg/dL   Calcium 8.0 (*) 8.4 - 10.5 mg/dL   GFR calc non Af Amer 37 (*) >90 mL/min   GFR calc Af Amer 43 (*) >90 mL/min     Micro: No results found for this or any previous visit (from the past 240 hour(s)).  Studies/Results: Dg Chest 2 View  09/12/2011  *RADIOLOGY REPORT*  Clinical Data: 78 year old female with cough.  Renal failure, septic shock.  CHEST - 2 VIEW  Comparison: 09/09/2011 and earlier.  Findings: Upright AP and lateral views of the chest.  Stable right IJ central line.  Stable right neck surgical clips.  Stable lung volumes.  Small to moderate bilateral pleural effusions.  Continued volume loss at the right base and confluent right retrocardiac opacity.  No pneumothorax or edema.  Stable cardiac size and mediastinal contours.  Stable visualized osseous structures.  IMPRESSION: Bilateral pleural effusions and ongoing right lung base collapse or  consolidation.  Appearance suspicious for right lower lobe pneumonia.  Original Report Authenticated By: Harley Hallmark, M.D.   Ct Chest Wo Contrast  09/13/2011  *RADIOLOGY REPORT*  Clinical Data: Bilateral pleural effusions and right lung collapse or consolidation on chest radiograph yesterday.  CT CHEST WITHOUT CONTRAST  Technique:  Multidetector CT imaging of the chest was performed following the standard protocol without IV contrast.  Comparison: Chest radiographs obtained yesterday.  Findings: Right jugular catheter tip at the junction of the superior vena cava and right atrium.  Small to moderate-sized right pleural effusion.  Adjacent right lower lobe airspace consolidation with an appearance most compatible with atelectasis.  Minimal left pleural effusion with adjacent left lower lobe atelectasis. Enlarged heart.  Coronary artery calcifications.  Thoracic spine degenerative changes.  Unremarkable upper abdomen.  IMPRESSION:  1.  Bilateral pleural effusions with associated bilateral lower lobe atelectasis, greater on the right. 2.  Cardiomegaly. 3.  Atheromatous coronary artery calcifications.  Original Report Authenticated By: Darrol Angel, M.D.    Medications:  Scheduled Meds:   . antiseptic oral rinse  15 mL Mouth Rinse q12n4p  . aspirin  325 mg Oral Daily  . chlorhexidine  15 mL Mouth Rinse BID  . diltiazem  240 mg Oral Daily  . enoxaparin (LOVENOX) injection  40 mg Subcutaneous Q24H  . Fluticasone-Salmeterol  1 puff Inhalation BID  . ipratropium  0.5 mg Nebulization Q6H WA  . levalbuterol  0.63 mg Nebulization Q6H WA  . levofloxacin (LEVAQUIN) IV  750 mg Intravenous Q48H  . metoprolol tartrate  50 mg Oral BID  . metroNIDAZOLE  500 mg Oral Q8H  . multivitamin with minerals  1 tablet Oral Daily  . predniSONE  40 mg Oral Q breakfast  . saccharomyces boulardii  250 mg Oral BID  . sodium chloride  1,000 mL Intravenous Once  . sodium chloride  3 mL Intravenous Q12H  . DISCONTD:  ipratropium  0.5 mg Nebulization Q6H   Continuous Infusions:  PRN Meds:.acetaminophen, levalbuterol, metoprolol, sodium chloride   Assessment: Principal Problem:  *Septic shock Active Problems:  Colitis presumed infectious  Hyperlipidemia  Acute renal failure  Salmonella enteritis   Plan: #1 acute kidney injury continues to improve   #2 shortness of breath, multifactorial due to a possible pneumonia/bilateral pleural effusions  The patient was treated with Avelox for 3 days. Then discontinued due to concern for C. difficile, this was discussed with the patient and family. However because of confirmation on the chest x-ray, CT scan patient was reinitiated  on Levaquin IV for right lower lobe pneumonia, the patient also has bilateral pleural effusions in addition to right lower lobe consolidation. She will be initiated on low dose Lasix, with close monitoring of her kidney function  #3 enterococcal UTI completed a five-day course of ciprofloxacin , currently stable   #4 hypoxemia/pulmonary edema/wheezing resolved, stable on nasal cannula , continue to wean oxygen , improved after diuresing as well as initiation of Advair/Atrovent/by mouth prednisone   #5 hypertension hemodynamically stable, holding olmesartan, atenolol, and reinitiate Lasix   #6 atrial fibrillation on diltiazem metoprolol stable, not a candidate for long-term anticoagulation given multiple comorbidities and fall risk , discontinue telemetry   #7 disposition anticipate discharge tomorrow to SNF, if C. difficile is ruled out   #8 dysphagia H. therapy following continue dysphagia 3 diet   #9 leukocytosis patient's elevated white count could be secondary to C. difficile versus initiation of steroids for her wheezing, patient's stool more formed, however given elevated white count will rule out C. difficile prior to discharge   #10 diarrhea C. difficile PCR pending at this time, empiric Flagyl to be continued as her  diarrhea has improved      LOS: 13 days   Trichelle Lehan 09/13/2011, 11:10 AM

## 2011-09-13 NOTE — Progress Notes (Signed)
Pt had two Bowel Movements during night shift. BM's were soft and brown/greenish in color and had no offensive odor. Unfortunately, despite best efforts, sample unable to be sent d/t contamination with urine. Will continue to use "hats" and try to obtain usable sample.  Earnest Conroy. Clelia Croft, RN

## 2011-09-13 NOTE — Progress Notes (Signed)
Day-shft RN and IV RN unable to insert peripheral IV, d/t poor veins. Right IJ left in for access at this time.   Earnest Conroy. Clelia Croft, RN

## 2011-09-14 LAB — CLOSTRIDIUM DIFFICILE BY PCR: Toxigenic C. Difficile by PCR: POSITIVE — AB

## 2011-09-14 LAB — BASIC METABOLIC PANEL
CO2: 25 mEq/L (ref 19–32)
Calcium: 8.4 mg/dL (ref 8.4–10.5)
Chloride: 104 mEq/L (ref 96–112)
Creatinine, Ser: 1.35 mg/dL — ABNORMAL HIGH (ref 0.50–1.10)
Glucose, Bld: 104 mg/dL — ABNORMAL HIGH (ref 70–99)

## 2011-09-14 NOTE — Progress Notes (Signed)
SLP Cancellation Note  Treatment cancelled today due to pt receiving breathing tx.  Note pt now Cdif +.  SLP to follow up next date for family ed and diet tolerance and will sign off.  Thanks.  Donavan Burnet, MS [facility] SLP [phone]

## 2011-09-14 NOTE — Progress Notes (Signed)
OT Cancellation Note  Treatment cancelled today due to patient's refusal to participate. Will check back another time.  Devanee Pomplun A OTR/L [phone] 09/14/2011, 2:34 PM

## 2011-09-14 NOTE — Progress Notes (Signed)
Physical Therapy Treatment Patient Details Name: [name] [name] MRN: [id number] DOB: 04-29-1923 Today's Date: 09/14/2011 Time: 4098-1191 PT Time Calculation (min): 15 min  PT Assessment / Plan / Recommendation Comments on Treatment Session  Pt wants to go home vs CIR/SNF and plans to have 24/7 assist/care with her.    Follow Up Recommendations  Supervision/Assistance - 24 hour;Skilled nursing facility    Barriers to Discharge        Equipment Recommendations  Rolling walker with 5" wheels    Recommendations for Other Services    Frequency Min 3X/week   Plan Discharge plan remains appropriate    Precautions / Restrictions     Pertinent Vitals/Pain RA during amb 89 - 94%    Mobility  Bed Mobility Bed Mobility: Not assessed Details for Bed Mobility Assistance: Pt OOB in recliner Transfers Transfers: Sit to Stand;Stand to Sit Sit to Stand: 4: Min assist;From chair/3-in-1;With armrests Stand to Sit: 4: Min assist;To chair/3-in-1;With armrests Details for Transfer Assistance: 25% VC's on proper tech using B UE to push up from chair and reach back proir to sit Ambulation/Gait Ambulation/Gait Assistance: 4: Min assist Ambulation Distance (Feet): 65 Feet Assistive device: Rolling walker Ambulation/Gait Assistance Details: 25% VC's to increase posture and proper walker to self distance.  Amb on RA range 89 - 94% encouraging deep breathing and coughing.  Pt c/o med fatigue after activity and demon 3/4 DOE. Gait Pattern: Step-through pattern;Decreased stride length;Trunk flexed Gait velocity: decreased Stairs: No     PT Goals   progressing    Visit Information  Last PT Received On: 09/14/11 Assistance Needed: +1                   End of Session PT - End of Session Equipment Utilized During Treatment: Gait belt Activity Tolerance: Patient limited by fatigue Patient left: in chair;with call bell/phone within reach;with family/visitor present   Felecia Shelling  PTA [facility]   Acute  Rehab Pager     [phone]

## 2011-09-14 NOTE — Progress Notes (Signed)
[facility] PROGRESS NOTE  [name] [name] WRU:[id number] DOB: [birth year] DOA: 08/31/2011 PCP: Henri Medal, MD  Assessment/Plan: Principal Problem:  *Septic shock Active Problems:  Colitis presumed infectious  Hyperlipidemia  Acute renal failure  Salmonella enteritis  #1 acute kidney injury - Cr beginning to trend up slightly on her outpt lasix, will recheck in am and if continuing upward trend will hold lasix and follow and restart at lower dose  #2 shortness of breath, multifactorial due to a possible pneumonia/bilateral pleural effusions  The patient was treated with Avelox for 3 days. Then discontinued due to concern for C. difficile, this was discussed with the patient and family. However because of confirmation on the chest x-ray, CT scan patient was reinitiated on Levaquin IV for right lower lobe pneumonia, the patient also has bilateral pleural effusions in addition to right lower lobe consolidation. -as above slight upward trend in Cr noted- follow as above -continue levaquin - obtain O2 sats on room air in am #3 enterococcal UTI completed a five-day course of ciprofloxacin , currently stable  #4 hypoxemia/pulmonary edema/wheezing resolved, stable on nasal cannula , continue to wean oxygen , improved after diuresing as well as initiation of Advair/Atrovent/by mouth prednisone-follow and taper  -on sat on RA in am, continue abx #5 hypertension hemodynamically stable, holding olmesartan, atenolol, and lasix  #6 atrial fibrillation on diltiazem metoprolol stable, not a candidate for long-term anticoagulation given multiple comorbidities and fall risk #7 C. difficile -continue flagly and florastor #8 dysphagia H. therapy following continue dysphagia 3 diet   #9 leukocytosis patient's elevated white count could be secondary to C. difficile versus initiation of steroids for her wheezing, patient's stool more formed  Code Status: full code Brief narrative: Pt is an 78 yo  female admitted on 08/31/2011 with weakness, waterry diarrhea, nausea/vomiting, lethargy, hypotension. Found to have diffuse colitis on CT abd/pelvis. PCCM consulted 6/27 due to hypotension and concern for sepsis. Stool reported to be positive for Salmonella. Also with Enterobacter in urine.  PMH of HTN, HLD, PUD, COPD  Patient was transferred to the ICU for sepsis/enterocolitis/developed hypotension requiring vasopressors. Had a right-sided central venous catheter placed on the 28th. Subsequently developed atrial fibrillation with rapid ventricular response. She also developed acute renal failure with a creatinine of 3.25. Nephrology was consulted. Her renal failure was thought to be a combination of ATN from IV contrast exposure versus acute interstitial nephritis due to ciprofloxacin/Protonix. Creatinine has improved steadily.  Patient also developed pulmonary edema/profound hypoxemia/metabolic acidosis requiring BiPAP support. This resolved and the patient was moved out of step down on 7/4. Since the patient has been on the floor she has improved steadily however she was noted to be more short of breath. And wheezing. Chest x-ray confirmed bilateral pleural effusions possible right lung base collapse/consolidation and empiric abx for PNA were started   Consultants:  GI - Dr Elnoria Howard  Renal - Dr Arlean Hopping  Procedures:  Flex sig on 6/29 - colitis and diverticular  Antibiotics:  levaquin - started 7/8  flagyl - started 7/8  HPI/Subjective: Pt  Has had 2 loose BMs so far this am, tolerating po well. Per daughter at bedside pt no longer wants to go to SNF and she will stay with her initially. Chart reviewed  Objective: Filed Vitals:   09/14/11 0213 09/14/11 0515 09/14/11 0951 09/14/11 1340  BP:  123/76  106/64  Pulse:  105  125  Temp:  98.4 F (36.9 C)  97.9 F (36.6 C)  TempSrc:  Oral  Oral  Resp:  19  20  Height:      Weight:  83 kg (182 lb 15.7 oz)    SpO2: 96% 94% 94% 95%     Intake/Output Summary (Last 24 hours) at 09/14/11 1507 Last data filed at 09/14/11 1300  Gross per 24 hour  Intake    680 ml  Output    800 ml  Net   -120 ml    Exam:   General:  [name], sitting up in   Chair, in NAD with [place] o2 on  Cardiovascular: RRR, nl S1 S2  Respiratory: decreased BS at bases, no wheezes, no crackles  Abdomen: soft, BS present NT/ND  EXT: no cyanosis, no edema  Data Reviewed: Basic Metabolic Panel:  Lab 09/14/11 7829 09/13/11 0300 09/10/11 1020 09/10/11 0415 09/09/11 0505 09/08/11 0518  NA 138 137 137 139 140 --  K 4.0 4.4 4.1 4.1 4.2 --  CL 104 105 107 108 110 --  CO2 25 23 24 23 23  --  GLUCOSE 104* 88 118* 99 111* --  BUN 27* 24* 33* 34* 40* --  CREATININE 1.35* 1.28* 1.57* 1.64* 1.87* --  CALCIUM 8.4 8.0* 8.3* 8.1* 8.4 --  MG -- -- -- -- -- --  PHOS -- -- -- -- -- 3.8   Liver Function Tests:  Lab 09/09/11 0505 09/08/11 0518  AST 15 --  ALT 14 --  ALKPHOS 55 --  BILITOT 0.5 --  PROT 5.5* --  ALBUMIN 2.4* 2.5*   No results found for this basename: LIPASE:5,AMYLASE:5 in the last 168 hours No results found for this basename: AMMONIA:5 in the last 168 hours CBC:  Lab 09/13/11 0300 09/12/11 0505 09/10/11 0415 09/09/11 0505  WBC 16.6* 27.7* 12.9* 15.3*  NEUTROABS -- -- -- --  HGB 8.8* 9.4* 9.2* 9.7*  HCT 27.2* 28.4* 28.0* 29.0*  MCV 95.1 94.4 93.6 92.7  PLT 266 257 193 209   Cardiac Enzymes: No results found for this basename: CKTOTAL:5,CKMB:5,CKMBINDEX:5,TROPONINI:5 in the last 168 hours BNP (last 3 results) No results found for this basename: PROBNP:3 in the last 8760 hours CBG: No results found for this basename: GLUCAP:5 in the last 168 hours  Recent Results (from the past 240 hour(s))  CLOSTRIDIUM DIFFICILE BY PCR     Status: Abnormal   Collection Time   09/13/11 10:05 PM      Component Value Range Status Comment   C difficile by pcr POSITIVE (*) NEGATIVE Final      Studies: Dg Chest 1 View  09/09/2011  *RADIOLOGY  REPORT*  Clinical Data: Cough.  Possible aspiration.  CHEST - 1 VIEW  Comparison: 09/06/2011.  09/03/2011.  Findings: Artifact overlies chest.  Right internal jugular central line has its tip in the SVC just above the right atrium.  There is patchy density in both lower lobes left worse than right.  This could be atelectasis or pneumonia.  Findings are similar to the study of 06/29.  Upper lobes remain clear.  IMPRESSION: Patchy atelectasis and/or infiltrate at both lung bases left more than right.  Similar appearance to the study of 06/29.  Original Report Authenticated By: Thomasenia Sales, M.D.   Dg Chest 1 View  09/03/2011  *RADIOLOGY REPORT*  Clinical Data: Shortness of breath, wheezing  CHEST - 1 VIEW  Comparison: 09/02/2011  Findings: Cardiomegaly.  Pulmonary vascular congestion without frank interstitial edema.  Mild bilateral lower lobe opacities, possibly atelectasis, left lower lobe pneumonia not entirely excluded.  Stable  right IJ venous catheter.  Surgical clips in the right neck.  IMPRESSION: Cardiomegaly.  Pulmonary vascular congestion without frank interstitial edema.  Mild bilateral lower lobe opacities, possibly atelectasis, left lower lobe pneumonia not entirely excluded.  Original Report Authenticated By: Charline Bills, M.D.   Dg Chest 2 View  09/12/2011  *RADIOLOGY REPORT*  Clinical Data: 78 year old female with cough.  Renal failure, septic shock.  CHEST - 2 VIEW  Comparison: 09/09/2011 and earlier.  Findings: Upright AP and lateral views of the chest.  Stable right IJ central line.  Stable right neck surgical clips.  Stable lung volumes.  Small to moderate bilateral pleural effusions.  Continued volume loss at the right base and confluent right retrocardiac opacity.  No pneumothorax or edema.  Stable cardiac size and mediastinal contours.  Stable visualized osseous structures.  IMPRESSION: Bilateral pleural effusions and ongoing right lung base collapse or consolidation.  Appearance  suspicious for right lower lobe pneumonia.  Original Report Authenticated By: Harley Hallmark, M.D.   Ct Chest Wo Contrast  09/13/2011  *RADIOLOGY REPORT*  Clinical Data: Bilateral pleural effusions and right lung collapse or consolidation on chest radiograph yesterday.  CT CHEST WITHOUT CONTRAST  Technique:  Multidetector CT imaging of the chest was performed following the standard protocol without IV contrast.  Comparison: Chest radiographs obtained yesterday.  Findings: Right jugular catheter tip at the junction of the superior vena cava and right atrium.  Small to moderate-sized right pleural effusion.  Adjacent right lower lobe airspace consolidation with an appearance most compatible with atelectasis.  Minimal left pleural effusion with adjacent left lower lobe atelectasis. Enlarged heart.  Coronary artery calcifications.  Thoracic spine degenerative changes.  Unremarkable upper abdomen.  IMPRESSION:  1.  Bilateral pleural effusions with associated bilateral lower lobe atelectasis, greater on the right. 2.  Cardiomegaly. 3.  Atheromatous coronary artery calcifications.  Original Report Authenticated By: Darrol Angel, M.D.   Ct Abdomen Pelvis W Contrast  08/31/2011  *RADIOLOGY REPORT*  Clinical Data: Lethargy, nausea, vomiting, diarrhea, and hypotension.  CT ABDOMEN AND PELVIS WITH CONTRAST  Technique:  Multidetector CT imaging of the abdomen and pelvis was performed following the standard protocol during bolus administration of intravenous contrast.  Contrast: OMNIPAQUE IOHEXOL 300 MG/ML  SOLN  Comparison: None.  Findings: Mild dependent atelectasis in the lung bases.  Probable cardiac enlargement.  The liver, spleen, gallbladder, pancreas, adrenal glands, kidneys, and retroperitoneal lymph nodes are unremarkable.  Diffuse calcification and tortuosity of the aorta without aneurysm.  Flow is demonstrated in the mesenteric and portal vessels.  There is a small amount of fluid around the posterior  liver edge on the right. The stomach and small bowel are decompressed.  Prominent visceral adipose tissues.  No free fluid or free air in the abdomen.  The colon is not distended but is fluid-filled consistent with liquid stool.  There is suggestion of mild diffuse colonic wall thickening suggesting edema or colitis.  Infectious or inflammatory colitis could have this appearance.  Pelvis:  The uterus is anteverted.  Uterus and adnexal structures are not enlarged.  Minimal free fluid in the pelvis.  Foley catheter decompresses the bladder.  No loculated pelvic fluid collections.  Diverticula in the sigmoid colon without diverticulitis.  The appendix is normal.  Extensive degenerative changes throughout the lumbar spine with mild lumbar scoliosis. Mild anterior subluxation of L4 on L5.  IMPRESSION: Mild diffuse wall thickening and liquid stool in the colon suggesting infectious or inflammatory process.  Minimal fluid  around the right liver edge and in the pelvis.  No evidence of focal mass or abscess.  Original Report Authenticated By: Marlon Pel, M.D.   US Renal Port  09/05/2011  *RADIOLOGY REPORT*  Clinical Data:  Acute renal insufficiency  RENAL/URINARY TRACT ULTRASOUND COMPLETE  Comparison:  08/31/2011  Findings:  Right Kidney:  Measures 10.4 cm. Normal echogenicity.  There is mild scarring and volume loss involving the inferior pole.  No evidence of mass or hydronephrosis.  Left Kidney:  Measures 11.1 cm.  Normal in size and parenchymal echogenicity.  No evidence of mass or hydronephrosis.  Bladder:  Appears normal for degree of bladder distention.  IMPRESSION:  1.  No evidence for obstructive uropathy. 2.  Right renal scarring  Original Report Authenticated By: Rosealee Albee, M.D.   Dg Chest Port 1 View  09/06/2011  *RADIOLOGY REPORT*  Clinical Data: Congestive heart failure  PORTABLE CHEST - 1 VIEW  Comparison: Chest radiograph 09/03/2011  Findings: Right central venous line is in place.  Stable  cardiac silhouette.  Central venous pulmonary congestion is increased compared to prior. Interstitial edema is present.  Lung bases are excluded.  No pneumothorax.  IMPRESSION: Increased central venous congestion and interstitial edema.  Original Report Authenticated By: Genevive Bi, M.D.   Dg Chest Port 1 View  09/02/2011  *RADIOLOGY REPORT*  Clinical Data: Central line placement.  PORTABLE CHEST - 1 VIEW  Comparison: 08/31/2011  Findings: Right central line tip at the cavoatrial junction.  No pneumothorax.  Cardiomegaly.  Left base atelectasis or infiltrate. Right lung is clear.  Heart is normal size.  No overt edema.  IMPRESSION: Right central line tip at the cavoatrial junction.  No pneumothorax.  Left lower lobe atelectasis or infiltrate.  Original Report Authenticated By: Cyndie Chime, M.D.   Dg Chest Port 1 View  08/31/2011  *RADIOLOGY REPORT*  Clinical Data: Cough.  Shortness of breath and weakness.  PORTABLE CHEST - 1 VIEW  Comparison: 02/04/2009  Findings: Shallow inspiration.  Cardiac enlargement with normal pulmonary vascularity.  No focal airspace consolidation or edema. No blunting of costophrenic angles.  No pneumothorax.  Surgical clips in the base of the neck.  Calcification of the aorta.  No significant change since previous study, allowing for technical differences.  IMPRESSION: Cardiac enlargement without significant vascular congestion or edema.  Original Report Authenticated By: Marlon Pel, M.D.    Scheduled Meds:   . antiseptic oral rinse  15 mL Mouth Rinse q12n4p  . aspirin  325 mg Oral Daily  . chlorhexidine  15 mL Mouth Rinse BID  . diltiazem  240 mg Oral Daily  . enoxaparin (LOVENOX) injection  40 mg Subcutaneous Q24H  . Fluticasone-Salmeterol  1 puff Inhalation BID  . furosemide  40 mg Oral Daily  . ipratropium  0.5 mg Nebulization Q6H WA  . levalbuterol  0.63 mg Nebulization Q6H WA  . levofloxacin (LEVAQUIN) IV  750 mg Intravenous Q48H  . metoprolol  tartrate  50 mg Oral BID  . metroNIDAZOLE  500 mg Oral Q8H  . multivitamin with minerals  1 tablet Oral Daily  . predniSONE  40 mg Oral Q breakfast  . saccharomyces boulardii  250 mg Oral BID  . sodium chloride  1,000 mL Intravenous Once  . sodium chloride  3 mL Intravenous Q12H   Continuous Infusions:    Kela Millin [facility] Pager [phone]  Dispo - pt/family now want to go home when dc'ed- not to SNF, will plan d/c  soon if diarrhea continues to improve(more formed and less frquent)  If 7PM-7AM, please contact night-coverage [email or web address] Password [email or web address] 09/14/2011, 3:07 PM   LOS: 14 days

## 2011-09-14 NOTE — Clinical Documentation Improvement (Signed)
RESPIRATORY FAILURE DOCUMENTATION CLARIFICATION QUERY   THIS DOCUMENT IS NOT A PERMANENT PART OF THE MEDICAL RECORD  TO RESPOND TO THE THIS QUERY, FOLLOW THE INSTRUCTIONS BELOW:  1. If needed, update documentation for the patient's encounter via the notes activity.  2. Access this query again and click edit on the In [employer].  3. After updating, or not, click F2 to complete all highlighted (required) fields concerning your review. Select "additional documentation in the medical record" OR "no additional documentation provided".  4. Click Sign note button.  5. The deficiency will fall out of your In Basket *Please let us know if you are not able to complete this workflow by phone or e-mail (listed below).  Please update your documentation within the medical record to reflect your response to this query.                                                                                    09/14/11  Dear Dr. Suanne Marker, A or Richarda Overlie,  Marton Redwood, Re-assigned to Dr. Suanne Marker  In a better effort to capture your patient's severity of illness, reflect appropriate length of stay and utilization of resources, a review of the patient medical record has revealed the following indicators.    Based on your clinical judgment, please clarify and document in a progress note and/or discharge summary the clinical condition associated with the following supporting information:  In responding to this query please exercise your independent judgment.  The fact that a query is asked, does not imply that any particular answer is desired or expected.   Pt with Sepsis, enterocolitis  According to pn on 09/13/11 pt with pulmonary edema and profound hypoxemia requiring BIPAP support in setting of sepsis/enterocolitis   Please clarify if the pulmonary edema and profound hypoxemia can qualify as one of the diagnoses listed below and document in pn or d/c summary.     Possible Clinical Conditions? (Plese  document all diagnoses that apply)  _______Acute Respiratory Failure _______Acute on Chronic Respiratory Failure _______Acute Pulmonary Edema  _______Other Condition________________ _______Cannot Clinically Determine    Supporting Information:  Risk Factors: Sepsis, Salmonella enteritis, enterococcal UTI, mild metabolic acidosis,  Signs&Symptoms: PN 09/09/11 Wheezing has improved. AMS - could be early uremia PN 09/12/11 hypoxemia/pulmonary edema/wheezing resolved, stable on nasal cannula , continue to wean oxygen , improved after diuresing as well as initiation of Advair/Atrovent/by mouth prednisone PN 7//9/13 Since the patient has been on the floor she has improved steadily however she was noted to be more short of breath. And wheezing. Chest x-ray confirmed bilateral pleural effusions possible right lung base collapse/consolidation  PN 09/13/11 Patient also developed pulmonary edema/profound hypoxemia/metabolic acidosis requiring BiPAP support. This resolved and the patient was moved out of step down 4 days ago   Diagnostics:  Lab:  Radiology: 08/31/11 IMPRESSION:  Cardiac enlargement without significant vascular congestion or  edema.  Treatment: Oxygen: 2L/[place]  Treatment:  Monitoring     BiPAP levalbuterol (XOPENEX) nebulizer solution 0.63 mg               You may use possible, probable, or suspect with inpatient documentation. possible, probable, suspected diagnoses MUST be documented  at the time of discharge  Reviewed:  no additional documentation provided ljh  Thank You,  Enis Slipper  RN, BSN, CCDS Clinical Documentation Specialist [facility] Dept Pager: [phone] / E-mail: [email or web address]  Health Information Management [place]

## 2011-09-15 LAB — BASIC METABOLIC PANEL
BUN: 27 mg/dL — ABNORMAL HIGH (ref 6–23)
Calcium: 8.5 mg/dL (ref 8.4–10.5)
GFR calc Af Amer: 42 mL/min — ABNORMAL LOW (ref >90)
GFR calc non Af Amer: 36 mL/min — ABNORMAL LOW (ref >90)
Potassium: 3.4 mEq/L — ABNORMAL LOW (ref 3.5–5.1)

## 2011-09-15 LAB — CBC
Hemoglobin: 9.5 g/dL — ABNORMAL LOW (ref 12.0–15.0)
MCHC: 32.8 g/dL (ref 30.0–36.0)
Platelets: 297 10*3/uL (ref 150–400)
RDW: 13.8 % (ref 11.5–15.5)

## 2011-09-15 MED ORDER — POTASSIUM CHLORIDE CRYS ER 20 MEQ PO TBCR
40.0000 meq | EXTENDED_RELEASE_TABLET | Freq: Once | ORAL | Status: DC
  Filled 2011-09-15: qty 2

## 2011-09-15 MED ORDER — PREDNISONE 20 MG PO TABS: 40.0000 mg | ORAL_TABLET | Freq: Every day | ORAL | Status: AC

## 2011-09-15 MED ORDER — LEVALBUTEROL HCL 0.63 MG/3ML IN NEBU
0.6300 mg | INHALATION_SOLUTION | Freq: Three times a day (TID) | RESPIRATORY_TRACT | Status: DC
  Administered 2011-09-15: 0.63 mg via RESPIRATORY_TRACT
  Filled 2011-09-15 (×3): qty 3

## 2011-09-15 MED ORDER — IPRATROPIUM BROMIDE 0.02 % IN SOLN
0.5000 mg | Freq: Three times a day (TID) | RESPIRATORY_TRACT | Status: DC
  Administered 2011-09-15: 0.5 mg via RESPIRATORY_TRACT
  Filled 2011-09-15: qty 2.5

## 2011-09-15 MED ORDER — LEVALBUTEROL TARTRATE 45 MCG/ACT IN AERO: 1.0000 | INHALATION_SPRAY | RESPIRATORY_TRACT | Status: DC | PRN

## 2011-09-15 MED ORDER — FLUTICASONE-SALMETEROL 250-50 MCG/DOSE IN AEPB: 1.0000 | INHALATION_SPRAY | Freq: Two times a day (BID) | RESPIRATORY_TRACT | Status: DC

## 2011-09-15 MED ORDER — DILTIAZEM HCL ER COATED BEADS 240 MG PO CP24: 240.0000 mg | ORAL_CAPSULE | Freq: Every day | ORAL | Status: DC

## 2011-09-15 MED ORDER — METRONIDAZOLE 500 MG PO TABS: 500.0000 mg | ORAL_TABLET | Freq: Three times a day (TID) | ORAL | Status: AC

## 2011-09-15 MED ORDER — SACCHAROMYCES BOULARDII 250 MG PO CAPS: 250.0000 mg | ORAL_CAPSULE | Freq: Two times a day (BID) | ORAL | Status: AC

## 2011-09-15 MED ORDER — LEVOFLOXACIN 500 MG PO TABS: 500.0000 mg | ORAL_TABLET | Freq: Every day | ORAL | Status: AC

## 2011-09-15 NOTE — Discharge Summary (Addendum)
Physician Discharge Summary  [name] [name] ZOX:[id number] DOB: 1923/05/02 DOA: 08/31/2011  PCP: Henri Medal, MD  Admit date: 08/31/2011 Discharge date: 09/15/2011  Recommendations for Outpatient Follow-up:  Follow-up Information    Follow up with Henri Medal, MD. (in 1-2weeks, call for appt upon discharge)    Contact information:   [facility] [address] [place] [address] [phone]          Discharge Diagnoses:  Principal Problem:  *Septic shock Active Problems:  Colitis presumed infectious  Hyperlipidemia  Acute renal failure  Salmonella enteritis clostridium difficile Pneumonia Atrial fibrillation Enterococcus UTI Dysphagia   Discharge Condition: improved/stable  Diet recommendation: low sodium heart healthy  History of present illness:  The pt is an 78 y.o. female with history of hypertension on blood pressure medication and diuretic, hyperlipidemia on a statin, COPD, history of peptic ulcer disease, status post bilateral endarterectomy, returned to the emergency room as she became more lethargic after taking the Phenergan prescribed by her PCP. She continued to have watery diarrhea, and has been feeling more weak. She was found hypotensive with systolic blood pressure of 60-70. Further workup showed normal white count and hemoglobin, elevated BUN and creatinine to 24 and 1.67, with normal lipase and lactic acid. Her urinalysis was cloudy with bacteria and 3-6 WBCs, mild metabolic acidosis with bicarbonate of 16. She had a abdominal pelvic CT with contrast which showed diffuse colonic wall thickening consistent with colitis, either infectious or inflammatory. No local abscess. She was given IV fluid and did respond. She was admitted to the Hospitalist was service further evaluation and management.  Hospital Course by problem list:  #1 septic shock As discussed above pt was admitted on 08/31/2011 with weakness, watery  diarrhea, nausea/vomiting, lethargy, hypotension. Found to have diffuse colitis on CT abd/pelvis. PCCM consulted 6/27 due to hypotension and concern for sepsis. She was transferred to the ICU for sepsis/enterocolitis/developed required vasopressors for the hypotension. Pt was also treated with empiric stress dose steroids x 5days and then tapered off. She was placed on appropriate empiric anitbiotics.  Subsequently Stool was reported to be positive for Salmonella; The urine grew  Enterobacter and both were sensitive to cipro and her antibiotics were changed accordingly. GI was consulted and Dr Elnoria Howard saw pt and flex sig was done on 6/29 and shoved colitis and diverticular. Subsequently  Pt developed atrial fibrillation with rapid ventricular response and was treated with metoprolol and cardizem, and  pt was noted not to be a candidate for longterm anticoagulation due to fall risk  Patient also developed pulmonary edema and profound hypoxemia/metabolic acidosis requiring BiPAP support. This resolved and the patient was moved out of step down on 7/4.   Following transfer to the floor she seemed to be improving initially however she was noted to be more short of breath and with wheezing and cough. Chest x-ray confirmed bilateral pleural effusions possible right lung base collapse/consolidation and empiric abx for PNA were started. She is clinically improved at this time , breathing better and hemdynamically stable and will be discharged on oral levaquin to complete the antibiotic course  #2 C. difficile - Pt subsequently had diarrhea again following transfer to floor and C. Diff done on 7/9  Came back positive for c.diff.  She was treated with flagly and florastor. She is clinically improved with stools more formed at this time and she will be dicharged on flagyl and is to follow up outpt.  #3 enterococcal UTI  -as above  this was sensitive to cipro and she completed adequate antibiotics course for this prior to  d/c #4 acute kidney injury -  She also developed acute renal failure with a creatinine of 3.25. Nephrology was consulted. Her renal failure was thought to be a combination of ATN from IV contrast exposure versus acute interstitial nephritis due to ciprofloxacin. Creatinine has improved steadily, nephrotoxins were avoided or dosed as renally appropriate. With these interventions her cr improved to 1.3 prior to discharge.  #5shortness of breath, multifactorial due to a possible pneumonia/bilateral pleural effusions/hypoxemia/pulmonary edema/  As discussed above chest x-ray showed findings c/w pneumonia and CT scan right lower lobe airspace consolidation and appropriate antibiotics were initiated. She was also diuresed with lasix, and received bronchodilators, and clinically improved as above.  #6 atrial fibrillation on diltiazem metoprolol stable, not a candidate for long-term anticoagulation given multiple comorbidities and fall risk  #7dysphagia : speech therapy followed pt and recommended continuing continue dysphagia 3 diet.   #8leukocytosis patient's elevated white count could be secondary to above infections as discussed versus steroids. It improved with treatment With abx as above to WBC of 13 prior to discharge and she was clinically improved, hemodynamically stable .  Consultants:  GI - Dr Elnoria Howard  Renal - Dr Arlean Hopping Procedures:  Flex sig on 6/29 - colitis and diverticular Antibiotics:  levaquin - started 7/8  flagyl - started 7/8  Discharge Exam: Filed Vitals:   09/15/11 0611  BP: 137/81  Pulse: 124  Temp: 97.7 F (36.5 C)  Resp: 18   Filed Vitals:   09/15/11 0611 09/15/11 0912 09/15/11 1227 09/15/11 1311  BP: 137/81     Pulse: 124     Temp: 97.7 F (36.5 C)     TempSrc: Oral     Resp: 18     Height:      Weight:      SpO2: 94% 95% 96% 93%   Exam:  General: [name], sitting up in Chair, in NAD with [place] o2 on  Cardiovascular: RRR, nl S1 S2  Respiratory: decreased BS at  bases, no wheezes, no crackles  Abdomen: soft, BS present NT/ND  EXT: no cyanosis, no edema  Discharge Instructions  Discharge Orders    Future Orders Please Complete By Expires   DIET DYS 3      Increase activity slowly        Medication List  As of 09/15/2011  3:03 PM   STOP taking these medications         diphenoxylate-atropine 2.5-0.025 MG per tablet      olmesartan 20 MG tablet      ondansetron 4 MG tablet         TAKE these medications         allopurinol 100 MG tablet   Commonly known as: ZYLOPRIM   Take 100 mg by mouth 2 (two) times daily.      atenolol 50 MG tablet   Commonly known as: TENORMIN   Take 50 mg by mouth daily.      atorvastatin 40 MG tablet   Commonly known as: LIPITOR   Take 40 mg by mouth daily.      colesevelam 625 MG tablet   Commonly known as: WELCHOL   Take 1,875 mg by mouth 2 (two) times daily with a meal.      diltiazem 240 MG 24 hr capsule   Commonly known as: CARDIZEM CD   Take 1 capsule (240 mg total) by mouth daily.  Fluticasone-Salmeterol 250-50 MCG/DOSE Aepb   Commonly known as: ADVAIR   Inhale 1 puff into the lungs 2 (two) times daily.      furosemide 40 MG tablet   Commonly known as: LASIX   Take 40 mg by mouth daily.      levalbuterol 45 MCG/ACT inhaler   Commonly known as: XOPENEX HFA   Inhale 1-2 puffs into the lungs every 4 (four) hours as needed for wheezing.      levofloxacin 500 MG tablet   Commonly known as: LEVAQUIN   Take 1 tablet (500 mg total) by mouth daily.      metroNIDAZOLE 500 MG tablet   Commonly known as: FLAGYL   Take 1 tablet (500 mg total) by mouth every 8 (eight) hours.      multivitamin with minerals Tabs   Take 1 tablet by mouth daily.      omega-3 acid ethyl esters 1 G capsule   Commonly known as: LOVAZA   Take 1 g by mouth 2 (two) times daily.      predniSONE 20 MG tablet   Commonly known as: DELTASONE   Take 2 tablets (40 mg total) by mouth daily with breakfast.       raloxifene 60 MG tablet   Commonly known as: EVISTA   Take 60 mg by mouth daily.      saccharomyces boulardii 250 MG capsule   Commonly known as: FLORASTOR   Take 1 capsule (250 mg total) by mouth 2 (two) times daily.           Follow-up Information    Follow up with Henri Medal, MD. (in 1-2weeks, call for appt upon discharge)    Contact information:   [facility] [address] [place] [address] [phone]           The results of significant diagnostics from this hospitalization (including imaging, microbiology, ancillary and laboratory) are listed below for reference.    Significant Diagnostic Studies: Dg Chest 1 View  09/09/2011  *RADIOLOGY REPORT*  Clinical Data: Cough.  Possible aspiration.  CHEST - 1 VIEW  Comparison: 09/06/2011.  09/03/2011.  Findings: Artifact overlies chest.  Right internal jugular central line has its tip in the SVC just above the right atrium.  There is patchy density in both lower lobes left worse than right.  This could be atelectasis or pneumonia.  Findings are similar to the study of 06/29.  Upper lobes remain clear.  IMPRESSION: Patchy atelectasis and/or infiltrate at both lung bases left more than right.  Similar appearance to the study of 06/29.  Original Report Authenticated By: Thomasenia Sales, M.D.   Dg Chest 1 View  09/03/2011  *RADIOLOGY REPORT*  Clinical Data: Shortness of breath, wheezing  CHEST - 1 VIEW  Comparison: 09/02/2011  Findings: Cardiomegaly.  Pulmonary vascular congestion without frank interstitial edema.  Mild bilateral lower lobe opacities, possibly atelectasis, left lower lobe pneumonia not entirely excluded.  Stable right IJ venous catheter.  Surgical clips in the right neck.  IMPRESSION: Cardiomegaly.  Pulmonary vascular congestion without frank interstitial edema.  Mild bilateral lower lobe opacities, possibly atelectasis, left lower lobe pneumonia not entirely excluded.  Original  Report Authenticated By: Charline Bills, M.D.   Dg Chest 2 View  09/12/2011  *RADIOLOGY REPORT*  Clinical Data: 78 year old female with cough.  Renal failure, septic shock.  CHEST - 2 VIEW  Comparison: 09/09/2011 and earlier.  Findings: Upright AP and lateral views of the chest.  Stable  right IJ central line.  Stable right neck surgical clips.  Stable lung volumes.  Small to moderate bilateral pleural effusions.  Continued volume loss at the right base and confluent right retrocardiac opacity.  No pneumothorax or edema.  Stable cardiac size and mediastinal contours.  Stable visualized osseous structures.  IMPRESSION: Bilateral pleural effusions and ongoing right lung base collapse or consolidation.  Appearance suspicious for right lower lobe pneumonia.  Original Report Authenticated By: Harley Hallmark, M.D.   Ct Chest Wo Contrast  09/13/2011  *RADIOLOGY REPORT*  Clinical Data: Bilateral pleural effusions and right lung collapse or consolidation on chest radiograph yesterday.  CT CHEST WITHOUT CONTRAST  Technique:  Multidetector CT imaging of the chest was performed following the standard protocol without IV contrast.  Comparison: Chest radiographs obtained yesterday.  Findings: Right jugular catheter tip at the junction of the superior vena cava and right atrium.  Small to moderate-sized right pleural effusion.  Adjacent right lower lobe airspace consolidation with an appearance most compatible with atelectasis.  Minimal left pleural effusion with adjacent left lower lobe atelectasis. Enlarged heart.  Coronary artery calcifications.  Thoracic spine degenerative changes.  Unremarkable upper abdomen.  IMPRESSION:  1.  Bilateral pleural effusions with associated bilateral lower lobe atelectasis, greater on the right. 2.  Cardiomegaly. 3.  Atheromatous coronary artery calcifications.  Original Report Authenticated By: Darrol Angel, M.D.   Ct Abdomen Pelvis W Contrast  08/31/2011  *RADIOLOGY REPORT*  Clinical  Data: Lethargy, nausea, vomiting, diarrhea, and hypotension.  CT ABDOMEN AND PELVIS WITH CONTRAST  Technique:  Multidetector CT imaging of the abdomen and pelvis was performed following the standard protocol during bolus administration of intravenous contrast.  Contrast: OMNIPAQUE IOHEXOL 300 MG/ML  SOLN  Comparison: None.  Findings: Mild dependent atelectasis in the lung bases.  Probable cardiac enlargement.  The liver, spleen, gallbladder, pancreas, adrenal glands, kidneys, and retroperitoneal lymph nodes are unremarkable.  Diffuse calcification and tortuosity of the aorta without aneurysm.  Flow is demonstrated in the mesenteric and portal vessels.  There is a small amount of fluid around the posterior liver edge on the right. The stomach and small bowel are decompressed.  Prominent visceral adipose tissues.  No free fluid or free air in the abdomen.  The colon is not distended but is fluid-filled consistent with liquid stool.  There is suggestion of mild diffuse colonic wall thickening suggesting edema or colitis.  Infectious or inflammatory colitis could have this appearance.  Pelvis:  The uterus is anteverted.  Uterus and adnexal structures are not enlarged.  Minimal free fluid in the pelvis.  Foley catheter decompresses the bladder.  No loculated pelvic fluid collections.  Diverticula in the sigmoid colon without diverticulitis.  The appendix is normal.  Extensive degenerative changes throughout the lumbar spine with mild lumbar scoliosis. Mild anterior subluxation of L4 on L5.  IMPRESSION: Mild diffuse wall thickening and liquid stool in the colon suggesting infectious or inflammatory process.  Minimal fluid around the right liver edge and in the pelvis.  No evidence of focal mass or abscess.  Original Report Authenticated By: Marlon Pel, M.D.   US Renal Port  09/05/2011  *RADIOLOGY REPORT*  Clinical Data:  Acute renal insufficiency  RENAL/URINARY TRACT ULTRASOUND COMPLETE  Comparison:   08/31/2011  Findings:  Right Kidney:  Measures 10.4 cm. Normal echogenicity.  There is mild scarring and volume loss involving the inferior pole.  No evidence of mass or hydronephrosis.  Left Kidney:  Measures 11.1 cm.  Normal in size and parenchymal echogenicity.  No evidence of mass or hydronephrosis.  Bladder:  Appears normal for degree of bladder distention.  IMPRESSION:  1.  No evidence for obstructive uropathy. 2.  Right renal scarring  Original Report Authenticated By: Rosealee Albee, M.D.   Dg Chest Port 1 View  09/06/2011  *RADIOLOGY REPORT*  Clinical Data: Congestive heart failure  PORTABLE CHEST - 1 VIEW  Comparison: Chest radiograph 09/03/2011  Findings: Right central venous line is in place.  Stable cardiac silhouette.  Central venous pulmonary congestion is increased compared to prior. Interstitial edema is present.  Lung bases are excluded.  No pneumothorax.  IMPRESSION: Increased central venous congestion and interstitial edema.  Original Report Authenticated By: Genevive Bi, M.D.   Dg Chest Port 1 View  09/02/2011  *RADIOLOGY REPORT*  Clinical Data: Central line placement.  PORTABLE CHEST - 1 VIEW  Comparison: 08/31/2011  Findings: Right central line tip at the cavoatrial junction.  No pneumothorax.  Cardiomegaly.  Left base atelectasis or infiltrate. Right lung is clear.  Heart is normal size.  No overt edema.  IMPRESSION: Right central line tip at the cavoatrial junction.  No pneumothorax.  Left lower lobe atelectasis or infiltrate.  Original Report Authenticated By: Cyndie Chime, M.D.   Dg Chest Port 1 View  08/31/2011  *RADIOLOGY REPORT*  Clinical Data: Cough.  Shortness of breath and weakness.  PORTABLE CHEST - 1 VIEW  Comparison: 02/04/2009  Findings: Shallow inspiration.  Cardiac enlargement with normal pulmonary vascularity.  No focal airspace consolidation or edema. No blunting of costophrenic angles.  No pneumothorax.  Surgical clips in the base of the neck.  Calcification  of the aorta.  No significant change since previous study, allowing for technical differences.  IMPRESSION: Cardiac enlargement without significant vascular congestion or edema.  Original Report Authenticated By: Marlon Pel, M.D.    Microbiology: Recent Results (from the past 240 hour(s))  CLOSTRIDIUM DIFFICILE BY PCR     Status: Abnormal   Collection Time   09/13/11 10:05 PM      Component Value Range Status Comment   C difficile by pcr POSITIVE (*) NEGATIVE Final      Labs: Basic Metabolic Panel:  Lab 09/15/11 1610 09/14/11 0540 09/13/11 0300 09/10/11 1020 09/10/11 0415  NA 137 138 137 137 139  K 3.4* 4.0 4.4 4.1 4.1  CL 102 104 105 107 108  CO2 27 25 23 24 23   GLUCOSE 91 104* 88 118* 99  BUN 27* 27* 24* 33* 34*  CREATININE 1.30* 1.35* 1.28* 1.57* 1.64*  CALCIUM 8.5 8.4 8.0* 8.3* 8.1*  MG -- -- -- -- --  PHOS -- -- -- -- --   Liver Function Tests:  Lab 09/09/11 0505  AST 15  ALT 14  ALKPHOS 55  BILITOT 0.5  PROT 5.5*  ALBUMIN 2.4*   No results found for this basename: LIPASE:5,AMYLASE:5 in the last 168 hours No results found for this basename: AMMONIA:5 in the last 168 hours CBC:  Lab 09/15/11 0530 09/13/11 0300 09/12/11 0505 09/10/11 0415 09/09/11 0505  WBC 13.1* 16.6* 27.7* 12.9* 15.3*  NEUTROABS -- -- -- -- --  HGB 9.5* 8.8* 9.4* 9.2* 9.7*  HCT 29.0* 27.2* 28.4* 28.0* 29.0*  MCV 94.8 95.1 94.4 93.6 92.7  PLT 297 266 257 193 209   Cardiac Enzymes: No results found for this basename: CKTOTAL:5,CKMB:5,CKMBINDEX:5,TROPONINI:5 in the last 168 hours BNP: BNP (last 3 results) No results found for this basename: PROBNP:3 in the  last 8760 hours CBG: No results found for this basename: GLUCAP:5 in the last 168 hours  Time coordinating discharge: >76mins  Signed:  Coline Calkin C  [facility] 09/15/2011, 3:03 PM

## 2011-09-15 NOTE — Progress Notes (Signed)
Speech Language Pathology Dysphagia Treatment Patient Details Name: [name] [name] MRN: [id number] DOB: 06/13/1923 Today's Date: 09/15/2011 Time: 4098-1191 SLP Time Calculation (min): 14 min  Assessment / Plan / Recommendation Clinical Impression  As SLP walked into room, pt stated "I have been doing what you told me, taking small bites/sips and drinking water."  SLP reviewed aspiration precautions with pt/daughter especially with pt's COPD and provided in writing with SLP contact number.   Also provided written reflux/LPR precautions per daughter request.  Pt's swallow ability appears back to baseline.    No further slp indicated at this point as all education completed and pt is following precautions.     Diet Recommendation  Continue with Current Diet: Dysphagia 3 (mechanical soft);Thin liquid (water with meals)    SLP Plan All goals met   Pertinent Vitals/Pain Afebrile, decreased   Swallowing Goals  SLP Swallowing Goals Swallow Study Goal #2 - Progress: Met Swallow Study Goal #3 - Progress: Met  General Temperature Spikes Noted: No Respiratory Status: Room air Behavior/Cognition: Alert;Cooperative;Pleasant mood Oral Cavity - Dentition: Adequate natural dentition Patient Positioning: Upright in chair   Dysphagia Treatment Treatment focused on: Patient/family/caregiver education Family/Caregiver Educated: daughter and pt Treatment Methods/Modalities: Other (comment) Patient observed directly with PO's: Yes (with oral rinse to swish and swallow) Type of PO's observed:  (thin medicine to swish and swallow) Feeding: Able to feed self Liquids provided via: Cup Pharyngeal Phase Signs & Symptoms: Delayed throat clear Type of cueing: Verbal Amount of cueing: Minimal    Donavan Burnet, MS [facility] SLP [phone]

## 2011-09-15 NOTE — Progress Notes (Signed)
Physical Therapy Treatment Patient Details Name: [name] [name] MRN: [id number] DOB: October 19, 1923 Today's Date: 09/15/2011 Time: 4098-1191 PT Time Calculation (min): 25 min  PT Assessment / Plan / Recommendation Comments on Treatment Session  Pt hopeful to go home after hospital stay vs SNF.     Follow Up Recommendations  Supervision/Assistance - 24 hour;Skilled nursing facility    Barriers to Discharge        Equipment Recommendations  Rolling walker with 5" wheels    Recommendations for Other Services    Frequency Min 3X/week   Plan Discharge plan remains appropriate    Precautions / Restrictions     Pertinent Vitals/Pain     Mobility  Bed Mobility Bed Mobility: Not assessed Details for Bed Mobility Assistance: Pt OOB in recliner Transfers Transfers: Sit to Stand;Stand to Sit Sit to Stand: 4: Min guard;From chair/3-in-1;With armrests Stand to Sit: 4: Min guard;To chair/3-in-1;With armrests Details for Transfer Assistance: 25% VC's on proper tech using B UE to push up from chair and reach back proir to sit  Ambulation/Gait Ambulation/Gait Assistance: 4: Min guard Ambulation Distance (Feet): 185 Feet Assistive device: Rolling walker Ambulation/Gait Assistance Details: 25% VC's to increase posture and safety during turns and negociating around obsticles.  RA range 91 - 93%.  HR with act highest 144 with good recover to low 100's with rest. Gait Pattern: Step-through pattern;Decreased stride length;Trunk flexed     PT Goals  progressing    Visit Information  Last PT Received On: 09/15/11 Assistance Needed: +1                   End of Session PT - End of Session Equipment Utilized During Treatment: Gait belt Activity Tolerance: Patient tolerated treatment well Patient left: in chair;with call bell/phone within reach   Felecia Shelling  PTA [facility]  Acute  Rehab Pager     [phone]

## 2011-09-28 ENCOUNTER — Emergency Department (HOSPITAL_COMMUNITY): Payer: Medicare Other

## 2011-09-28 ENCOUNTER — Inpatient Hospital Stay (HOSPITAL_COMMUNITY)
Admission: EM | Admit: 2011-09-28 | Discharge: 2011-10-06 | DRG: 308 | Disposition: A | Discharge status: Home Health | Payer: Medicare Other | Attending: Internal Medicine | Admitting: Internal Medicine

## 2011-09-28 ENCOUNTER — Encounter (HOSPITAL_COMMUNITY): Payer: Self-pay | Admitting: Emergency Medicine

## 2011-09-28 DIAGNOSIS — I509 Heart failure, unspecified: Secondary | ICD-10-CM | POA: Diagnosis present

## 2011-09-28 DIAGNOSIS — J449 Chronic obstructive pulmonary disease, unspecified: Secondary | ICD-10-CM | POA: Diagnosis present

## 2011-09-28 DIAGNOSIS — D72829 Elevated white blood cell count, unspecified: Secondary | ICD-10-CM | POA: Diagnosis present

## 2011-09-28 DIAGNOSIS — D638 Anemia in other chronic diseases classified elsewhere: Secondary | ICD-10-CM | POA: Diagnosis present

## 2011-09-28 DIAGNOSIS — Z87442 Personal history of urinary calculi: Secondary | ICD-10-CM

## 2011-09-28 DIAGNOSIS — R0602 Shortness of breath: Secondary | ICD-10-CM | POA: Diagnosis present

## 2011-09-28 DIAGNOSIS — E860 Dehydration: Secondary | ICD-10-CM

## 2011-09-28 DIAGNOSIS — I10 Essential (primary) hypertension: Secondary | ICD-10-CM | POA: Diagnosis present

## 2011-09-28 DIAGNOSIS — L02419 Cutaneous abscess of limb, unspecified: Secondary | ICD-10-CM | POA: Diagnosis present

## 2011-09-28 DIAGNOSIS — N179 Acute kidney failure, unspecified: Secondary | ICD-10-CM | POA: Diagnosis present

## 2011-09-28 DIAGNOSIS — J4489 Other specified chronic obstructive pulmonary disease: Secondary | ICD-10-CM | POA: Diagnosis present

## 2011-09-28 DIAGNOSIS — L03119 Cellulitis of unspecified part of limb: Secondary | ICD-10-CM | POA: Diagnosis present

## 2011-09-28 DIAGNOSIS — Z8711 Personal history of peptic ulcer disease: Secondary | ICD-10-CM

## 2011-09-28 DIAGNOSIS — I959 Hypotension, unspecified: Secondary | ICD-10-CM

## 2011-09-28 DIAGNOSIS — M810 Age-related osteoporosis without current pathological fracture: Secondary | ICD-10-CM | POA: Diagnosis present

## 2011-09-28 DIAGNOSIS — Z79899 Other long term (current) drug therapy: Secondary | ICD-10-CM

## 2011-09-28 DIAGNOSIS — I4891 Unspecified atrial fibrillation: Principal | ICD-10-CM | POA: Diagnosis present

## 2011-09-28 DIAGNOSIS — I2510 Atherosclerotic heart disease of native coronary artery without angina pectoris: Secondary | ICD-10-CM | POA: Diagnosis present

## 2011-09-28 DIAGNOSIS — L03116 Cellulitis of left lower limb: Secondary | ICD-10-CM | POA: Diagnosis present

## 2011-09-28 DIAGNOSIS — E785 Hyperlipidemia, unspecified: Secondary | ICD-10-CM | POA: Diagnosis present

## 2011-09-28 DIAGNOSIS — M7989 Other specified soft tissue disorders: Secondary | ICD-10-CM

## 2011-09-28 DIAGNOSIS — E8770 Fluid overload, unspecified: Secondary | ICD-10-CM

## 2011-09-28 DIAGNOSIS — R7309 Other abnormal glucose: Secondary | ICD-10-CM | POA: Diagnosis present

## 2011-09-28 DIAGNOSIS — I5042 Chronic combined systolic (congestive) and diastolic (congestive) heart failure: Secondary | ICD-10-CM | POA: Diagnosis present

## 2011-09-28 DIAGNOSIS — I5043 Acute on chronic combined systolic (congestive) and diastolic (congestive) heart failure: Secondary | ICD-10-CM | POA: Diagnosis present

## 2011-09-28 DIAGNOSIS — Z96659 Presence of unspecified artificial knee joint: Secondary | ICD-10-CM

## 2011-09-28 DIAGNOSIS — E876 Hypokalemia: Secondary | ICD-10-CM | POA: Diagnosis present

## 2011-09-28 HISTORY — DX: Unspecified atrial fibrillation: I48.91

## 2011-09-28 HISTORY — DX: Atherosclerotic heart disease of native coronary artery without angina pectoris: I25.10

## 2011-09-28 LAB — PRO B NATRIURETIC PEPTIDE: Pro B Natriuretic peptide (BNP): 2255 pg/mL — ABNORMAL HIGH (ref 0–450)

## 2011-09-28 LAB — CBC
Platelets: 211 10*3/uL (ref 150–400)
RBC: 2.96 MIL/uL — ABNORMAL LOW (ref 3.87–5.11)
RDW: 14.5 % (ref 11.5–15.5)
WBC: 12.3 10*3/uL — ABNORMAL HIGH (ref 4.0–10.5)

## 2011-09-28 LAB — POCT I-STAT TROPONIN I: Troponin i, poc: 0.02 ng/mL (ref 0.00–0.08)

## 2011-09-28 LAB — BASIC METABOLIC PANEL
CO2: 25 mEq/L (ref 19–32)
Chloride: 101 mEq/L (ref 96–112)
GFR calc Af Amer: 49 mL/min — ABNORMAL LOW (ref >90)
Potassium: 3.2 mEq/L — ABNORMAL LOW (ref 3.5–5.1)
Sodium: 136 mEq/L (ref 135–145)

## 2011-09-28 MED ORDER — FUROSEMIDE 40 MG PO TABS
40.0000 mg | ORAL_TABLET | Freq: Every day | ORAL | Status: DC
  Administered 2011-09-29: 40 mg via ORAL
  Filled 2011-09-28: qty 1

## 2011-09-28 MED ORDER — ONDANSETRON HCL 4 MG/2ML IJ SOLN: 4.0000 mg | Freq: Four times a day (QID) | INTRAMUSCULAR | Status: DC | PRN

## 2011-09-28 MED ORDER — DILTIAZEM HCL 50 MG/10ML IV SOLN: 10.0000 mg | Freq: Once | INTRAVENOUS | Status: DC

## 2011-09-28 MED ORDER — FLUTICASONE-SALMETEROL 250-50 MCG/DOSE IN AEPB
1.0000 | INHALATION_SPRAY | Freq: Two times a day (BID) | RESPIRATORY_TRACT | Status: DC
  Administered 2011-09-29 – 2011-10-05 (×13): 1 via RESPIRATORY_TRACT
  Filled 2011-09-28: qty 14

## 2011-09-28 MED ORDER — COLESEVELAM HCL 625 MG PO TABS
1875.0000 mg | ORAL_TABLET | Freq: Two times a day (BID) | ORAL | Status: DC
  Administered 2011-09-29 – 2011-10-06 (×14): 1875 mg via ORAL
  Filled 2011-09-28 (×17): qty 3

## 2011-09-28 MED ORDER — RALOXIFENE HCL 60 MG PO TABS
60.0000 mg | ORAL_TABLET | Freq: Every day | ORAL | Status: DC
  Administered 2011-09-29 – 2011-10-06 (×7): 60 mg via ORAL
  Filled 2011-09-28 (×9): qty 1

## 2011-09-28 MED ORDER — ENOXAPARIN SODIUM 30 MG/0.3ML ~~LOC~~ SOLN
30.0000 mg | SUBCUTANEOUS | Status: DC
  Administered 2011-09-29: 30 mg via SUBCUTANEOUS
  Filled 2011-09-28: qty 0.3

## 2011-09-28 MED ORDER — TECHNETIUM TO 99M ALBUMIN AGGREGATED
3.3000 | Freq: Once | INTRAVENOUS | Status: AC | PRN
  Administered 2011-09-28: 3 via INTRAVENOUS

## 2011-09-28 MED ORDER — ATORVASTATIN CALCIUM 40 MG PO TABS
40.0000 mg | ORAL_TABLET | Freq: Every day | ORAL | Status: DC
  Administered 2011-09-29 – 2011-10-05 (×7): 40 mg via ORAL
  Filled 2011-09-28 (×8): qty 1

## 2011-09-28 MED ORDER — LEVALBUTEROL TARTRATE 45 MCG/ACT IN AERO: 1.0000 | INHALATION_SPRAY | RESPIRATORY_TRACT | Status: DC | PRN

## 2011-09-28 MED ORDER — DILTIAZEM HCL 100 MG IV SOLR
5.0000 mg/h | INTRAVENOUS | Status: DC
  Administered 2011-09-29: 5 mg/h via INTRAVENOUS
  Filled 2011-09-28: qty 100

## 2011-09-28 MED ORDER — DILTIAZEM HCL ER COATED BEADS 240 MG PO CP24
240.0000 mg | ORAL_CAPSULE | Freq: Every day | ORAL | Status: DC
  Administered 2011-09-29: 240 mg via ORAL
  Filled 2011-09-28 (×2): qty 1

## 2011-09-28 MED ORDER — DILTIAZEM LOAD VIA INFUSION
10.0000 mg | Freq: Once | INTRAVENOUS | Status: AC
  Administered 2011-09-29: 10 mg via INTRAVENOUS
  Filled 2011-09-28: qty 10

## 2011-09-28 MED ORDER — ALLOPURINOL 100 MG PO TABS
100.0000 mg | ORAL_TABLET | Freq: Every day | ORAL | Status: DC
  Administered 2011-09-29 – 2011-10-06 (×7): 100 mg via ORAL
  Filled 2011-09-28 (×9): qty 1

## 2011-09-28 MED ORDER — TRAZODONE 25 MG HALF TABLET
25.0000 mg | ORAL_TABLET | Freq: Every day | ORAL | Status: DC
  Administered 2011-09-29 – 2011-10-05 (×7): 25 mg via ORAL
  Filled 2011-09-28 (×9): qty 1

## 2011-09-28 MED ORDER — HYDROCODONE-ACETAMINOPHEN 5-325 MG PO TABS
1.0000 | ORAL_TABLET | ORAL | Status: DC | PRN
  Administered 2011-10-04: 2 via ORAL
  Filled 2011-09-28: qty 2

## 2011-09-28 MED ORDER — ONDANSETRON HCL 4 MG PO TABS: 4.0000 mg | ORAL_TABLET | Freq: Four times a day (QID) | ORAL | Status: DC | PRN

## 2011-09-28 NOTE — ED Notes (Signed)
Unsuccessfully attempted to obtain blood for labs x2.RN Taquita made aware 

## 2011-09-28 NOTE — ED Provider Notes (Signed)
History     CSN: [id number]  Arrival date & time 09/28/11  1537   First MD Initiated Contact with Patient 09/28/11 1658      Chief Complaint  Patient presents with  . Shortness of Breath    HPI This patient with multiple medical complaints, and a recent hospitalization for salmonella complicated by renal failure now presents with dyspnea, lower extremity edema, generalized discomfort and palpitations.  She notes that since discharge 2 weeks ago she was initially improving.  These new symptoms are gradually began several days ago, and are now prominent, uncomfortable.  She denies focal chest pain, vomiting, abdominal pain, but does have mild intermittent diarrhea.  She is also mildly nauseous. With these new symptoms patient saw her primary care physician, had blood work performed.  An ECG there was treated atrial fibrillation.  Labs were notable for a positive d-dimer. Past Medical History  Diagnosis Date  . Hypertension   . Hyperlipidemia   . COPD (chronic obstructive pulmonary disease)   . Osteoporosis   . Peptic ulcer disease   . History of kidney stones   . Arthritis   . H/O hiatal hernia   . Chronic back pain     Past Surgical History  Procedure Date  . Carotid endarterectomy     bilateral  . Cataract extraction, bilateral   . Joint replacement 2008    Right Total Knee  . Anal fistula repair   . Left parotidectomy   . Eye surgery   . Flexible sigmoidoscopy 09/02/2011    Procedure: FLEXIBLE SIGMOIDOSCOPY;  Surgeon: Theda Belfast, MD;  Location: [facility];  Service: Endoscopy;  Laterality: N/A;  . Flexible sigmoidoscopy 09/03/2011    Procedure: FLEXIBLE SIGMOIDOSCOPY;  Surgeon: Theda Belfast, MD;  Location: [facility];  Service: Endoscopy;  Laterality: N/A;    No family history on file.  History  Substance Use Topics  . Smoking status: Former Smoker -- 1.0 packs/day for 20 years    Types: Cigarettes    Quit date: 09/01/1983  . Smokeless tobacco: Never Used    . Alcohol Use: No    OB History    Grav Para Term Preterm Abortions TAB SAB Ect Mult Living                  Review of Systems  Constitutional:       HPI  HENT:       HPI otherwise negative  Eyes: Negative.   Respiratory:       HPI, otherwise negative  Cardiovascular:       HPI, otherwise nmegative  Gastrointestinal: Negative for vomiting.  Genitourinary:       HPI, otherwise negative  Musculoskeletal:       HPI, otherwise negative  Skin: Negative.   Neurological: Negative for syncope.    Allergies  Demerol and Penicillins  Home Medications   Current Outpatient Rx  Name Route Sig Dispense Refill  . ALLOPURINOL 100 MG PO TABS Oral Take 100 mg by mouth daily.     . ATORVASTATIN CALCIUM 40 MG PO TABS Oral Take 40 mg by mouth daily.    . COLESEVELAM HCL 625 MG PO TABS Oral Take 1,875 mg by mouth 2 (two) times daily with a meal.    . DILTIAZEM HCL ER COATED BEADS 240 MG PO CP24 Oral Take 1 capsule (240 mg total) by mouth daily. 30 capsule 0  . FLUTICASONE-SALMETEROL 250-50 MCG/DOSE IN AEPB Inhalation Inhale 1 puff into the lungs 2 (two)  times daily. 60 each 0  . FUROSEMIDE 40 MG PO TABS Oral Take 40 mg by mouth daily.    Marland Kitchen LEVALBUTEROL TARTRATE 45 MCG/ACT IN AERO Inhalation Inhale 1-2 puffs into the lungs every 4 (four) hours as needed for wheezing. 1 Inhaler 12  . OMEGA-3-ACID ETHYL ESTERS 1 G PO CAPS Oral Take 1 g by mouth 2 (two) times daily.    Marland Kitchen RALOXIFENE HCL 60 MG PO TABS Oral Take 60 mg by mouth daily.    . TRAZODONE HCL 50 MG PO TABS Oral Take 25 mg by mouth at bedtime. Take 1/2 tablet at bedtime for sleep      BP 119/63  Pulse 80  Temp 99.4 F (37.4 C) (Oral)  Resp 18  SpO2 94%  Physical Exam  Nursing note and vitals reviewed. Constitutional: She is oriented to person, place, and time. She appears well-developed and well-nourished. No distress.  HENT:  Head: Normocephalic and atraumatic.  Eyes: Conjunctivae and EOM are normal.  Cardiovascular: Normal  rate and regular rhythm.   Pulmonary/Chest: Effort normal. No stridor. No respiratory distress. She has decreased breath sounds.  Abdominal: She exhibits no distension.  Musculoskeletal: She exhibits no edema.       Lower extremity edema, nonpitting, left greater than right. Pulses are 2+ in both distal extremities. And no upper extremity lesions.   Neurological: She is alert and oriented to person, place, and time. No cranial nerve deficit.  Skin: Skin is warm and dry.  Psychiatric: She has a normal mood and affect.    ED Course  Procedures (including critical care time)   Labs Reviewed  CBC  BASIC METABOLIC PANEL   No results found.   No diagnosis found.  Cardiac 80 sinus rhythm normal Pulse ox 94% on room air abnormal   Date: 09/28/2011  Rate: 111  Rhythm: atrial fibrillation  QRS Axis: normal  Intervals: AFIB  ST/T Wave abnormalities: nonspecific ST/T changes  Conduction Disutrbances:none  Narrative Interpretation:   Old EKG Reviewed: changes noted  AFIB, abnormal   12:00 AM The patient has been intermittently sleeping, but notes that she generally better.  She remains tachycardic into the 120/130 range  Angiocath insertion Performed by: Gerhard Munch  Consent: Verbal consent obtained. Risks and benefits: risks, benefits and alternatives were discussed Time out: Immediately prior to procedure a "time out" was called to verify the correct patient, procedure, equipment, support staff and site/side marked as required.  Preparation: Patient was prepped and draped in the usual sterile fashion.  Vein Location: Right a.c.  Yes Ultrasound Guided  Gauge: 20  Normal blood return and flush without difficulty Patient tolerance: Patient tolerated the procedure well with no immediate complications.  A review of the patient's recent hospitalization demonstrates echocardiogram one month ago with appropriate ejection fraction. She was noted to have intermittent  episodes of atrial fibrillation during that hospitalization  MDM  This pleasant elderly female presents after a recent hospitalization for salmonella bacteremia, now with dyspnea, lower extremity edema.  Notably, prior to arrival the patient had seen her primary care physician, had labs sent.  These were notable for a positive D. dimer.  Given this finding, the patient's endorsement of new dyspnea, new extremity edema, there suspicion for ongoing thromboembolic process.  Additionally, the patient was in atrial fibrillation on initial exam.  The patient's rib x-rays did not demonstrate blood clot, though there was mild suggestion of pulmonary congestion.  The patient also has a newly elevated BNP.  Given the persistent  A. fib with RVR she required initiation of a Cardizem drip.  This was dilated to to lack of available equipment in the emergency department, but the patient remained otherwise hemodynamically stable awaiting this intervention. The patient was admitted for further evaluation and management  CRITICAL CARE Performed by: Gerhard Munch   Total critical care time: 35  Critical care time was exclusive of separately billable procedures and treating other patients.  Critical care was necessary to treat or prevent imminent or life-threatening deterioration.  Critical care was time spent personally by me on the following activities: development of treatment plan with patient and/or surrogate as well as nursing, discussions with consultants, evaluation of patient's response to treatment, examination of patient, obtaining history from patient or surrogate, ordering and performing treatments and interventions, ordering and review of laboratory studies, ordering and review of radiographic studies, pulse oximetry and re-evaluation of patient's condition.   Gerhard Munch, MD 09/29/11 0003

## 2011-09-28 NOTE — ED Notes (Addendum)
Pt presenting to ed with c/o shortness of breath and swelling to extremities. Pt states she has also been tachycardic. Pt daughter states that she had blood work and ekg done yesterday and received a call from her doctor that she had a positive D-dimer and she needed to come to the er. Per daughter pt became dizzy this morning and almost fell. Pt denies chest pain at this time. Pt states some nausea no vomiting

## 2011-09-28 NOTE — ED Notes (Signed)
Respiratory unsuccessful at arterial blood gas.

## 2011-09-28 NOTE — H&P (Signed)
[facility] History and Physical  [name] [name] BJY:[id number] DOB: 12/14/1923 DOA: 09/28/2011  Referring physician: ED physician PCP: [name], MD   Chief Complaint: Shortness of breath  HPI:  Patient is 78 yo female who presents with main concern of progressively worsening shortness of breath that started several days prior to admission and have been associated with poor oral intake, generalized fatigue, lower extremity swelling.Pt saw her PCP one day prior to admission for the symptoms and blood work done showed slightly worsening renal failure and elevated d- dimer, as well atrial fibrillation on 12 lead EKG. Pt denies any chest pain at this time, no specific associated abdominal or urinary concerns,   Assessment and Plan:  Principal Problem:  *Shortness of breath - unclear etiology at this time and possibly related to atrial fibrillation - will admit the pt to step down unit for closer observation and will start her in cardizem drip - will also continue home medication regimen with Cardizem and may need to readjust the PO dose as indicated - follow upon 12 lead EKG, CE's - check TSH  Active Problems:  Atrial fibrillation - pt has no history of atrial fib - family would like for pt to get established with cardiologist - continue cardizem drip   Leukocytosis - unclear etiology but possibly related to left lower extremity cellulitis - will start clindamycin IV - change to PO once pt clinically improving   Anemia due to chronic illness - Hg and Hct are at pt's baseline   Hypokalemia - will supplement - BMP in AM   Hyperlipidemia - continue statin   Acute renal failure - hold of on IVF - creatinine is around pt's baseline - BMP in AM   Code Status: Full Family Communication: Pt at bedside Disposition Plan: PT evaluation    Review of Systems:  Constitutional: Negative for fever, chills and malaise/fatigue. Negative for diaphoresis.  HENT: Negative for  hearing loss, ear pain, nosebleeds, congestion, sore throat, neck pain, tinnitus and ear discharge.   Eyes: Negative for blurred vision, double vision, photophobia, pain, discharge and redness.  Respiratory: Negative for cough, hemoptysis, sputum production, positive for shortness of breath, negative for wheezing and stridor.   Cardiovascular: Negative for chest pain, palpitations, orthopnea, claudication and positive for left leg swelling.  Gastrointestinal: Negative for nausea, vomiting and abdominal pain. Negative for heartburn, constipation, blood in stool and melena.  Genitourinary: Negative for dysuria, urgency, frequency, hematuria and flank pain.  Musculoskeletal: Negative for myalgias, back pain, joint pain and falls.  Skin: Negative for itching and rash.  Neurological: Negative for dizziness and weakness. Negative for tingling, tremors, sensory change, speech change, focal weakness, loss of consciousness and headaches.  Endo/Heme/Allergies: Negative for environmental allergies and polydipsia. Does not bruise/bleed easily.  Psychiatric/Behavioral: Negative for suicidal ideas. The patient is not nervous/anxious.      Past Medical History  Diagnosis Date  . Hypertension   . Hyperlipidemia   . COPD (chronic obstructive pulmonary disease)   . Osteoporosis   . Peptic ulcer disease   . History of kidney stones   . Arthritis   . H/O hiatal hernia   . Chronic back pain     Past Surgical History  Procedure Date  . Carotid endarterectomy     bilateral  . Cataract extraction, bilateral   . Joint replacement 2008    Right Total Knee  . Anal fistula repair   . Left parotidectomy   . Eye surgery   . Flexible sigmoidoscopy 09/02/2011  Procedure: FLEXIBLE SIGMOIDOSCOPY;  Surgeon: Theda Belfast, MD;  Location: [facility];  Service: Endoscopy;  Laterality: N/A;  . Flexible sigmoidoscopy 09/03/2011    Procedure: FLEXIBLE SIGMOIDOSCOPY;  Surgeon: Theda Belfast, MD;  Location: [facility];  Service: Endoscopy;  Laterality: N/A;    Social History:  reports that she quit smoking about 28 years ago. Her smoking use included Cigarettes. She has a 20 pack-year smoking history. She has never used smokeless tobacco. She reports that she does not drink alcohol or use illicit drugs.  Allergies  Allergen Reactions  . Demerol (Meperidine) Hives  . Penicillins Hives    Family history: No history of cancers and no history of heart diseases  Prior to Admission medications   Medication Sig Start Date End Date Taking? Authorizing Provider  allopurinol (ZYLOPRIM) 100 MG tablet Take 100 mg by mouth daily.    Yes Historical Provider, MD  atorvastatin (LIPITOR) 40 MG tablet Take 40 mg by mouth daily.   Yes Historical Provider, MD  colesevelam (WELCHOL) 625 MG tablet Take 1,875 mg by mouth 2 (two) times daily with a meal.   Yes Historical Provider, MD  diltiazem (CARDIZEM CD) 240 MG 24 hr capsule Take 1 capsule (240 mg total) by mouth daily. 09/15/11 09/14/12 Yes Adeline C Viyuoh, MD  Fluticasone-Salmeterol (ADVAIR) 250-50 MCG/DOSE AEPB Inhale 1 puff into the lungs 2 (two) times daily. 09/15/11  Yes Adeline C Viyuoh, MD  furosemide (LASIX) 40 MG tablet Take 40 mg by mouth daily.   Yes Historical Provider, MD  levalbuterol [facility] HFA) 45 MCG/ACT inhaler Inhale 1-2 puffs into the lungs every 4 (four) hours as needed for wheezing. 09/15/11 09/14/12 Yes Adeline Joselyn Glassman, MD  omega-3 acid ethyl esters (LOVAZA) 1 G capsule Take 1 g by mouth 2 (two) times daily.   Yes Historical Provider, MD  raloxifene (EVISTA) 60 MG tablet Take 60 mg by mouth daily.   Yes Historical Provider, MD  traZODone (DESYREL) 50 MG tablet Take 25 mg by mouth at bedtime. Take 1/2 tablet at bedtime for sleep   Yes Historical Provider, MD    Physical Exam: Filed Vitals:   09/28/11 1644 09/28/11 1815  BP: 119/63 131/72  Pulse: 80 145  Temp: 99.4 F (37.4 C)   TempSrc: Oral   Resp: 18 28  SpO2: 94% 93%     Physical Exam  Constitutional: Appears well-developed and well-nourished. No distress.  HENT: Normocephalic. External right and left ear normal. Oropharynx is clear and moist.  Eyes: Conjunctivae and EOM are normal. PERRLA, no scleral icterus.  Neck: Normal ROM. Neck supple. No JVD. No tracheal deviation. No thyromegaly.  CVS: IRRR, no murmurs, no gallops, no carotid bruit.  Pulmonary: Effort and breath sounds normal, no stridor, rhonchi, wheezes, rales.  Abdominal: Soft. BS +,  no distension, tenderness, rebound or guarding.  Musculoskeletal: Normal range of motion. Left lower extremity swelling and tenderness to palpation with warmth to touch  Lymphadenopathy: No lymphadenopathy noted, cervical, inguinal. Neuro: Alert. Normal reflexes, muscle tone coordination. No cranial nerve deficit. Skin: Skin is warm and dry. No rash noted. Not diaphoretic. No erythema. No pallor.  Psychiatric: Normal mood and affect. Behavior, judgment, thought content normal.   Labs on Admission:  Basic Metabolic Panel:  Lab 09/28/11 4540  NA 136  K 3.2*  CL 101  CO2 25  GLUCOSE 93  BUN 10  CREATININE 1.14*  CALCIUM 9.2  MG --  PHOS --   CBC:  Lab 09/28/11 2200  WBC  12.3*  NEUTROABS --  HGB 8.9*  HCT 27.8*  MCV 93.9  PLT 211    Radiological Exams on Admission: Dg Chest 2 View  09/28/2011  *RADIOLOGY REPORT*  Clinical Data: Shortness of breath and bilateral lower extremity edema.  CHEST - 2 VIEW  Comparison: 09/12/2011  Findings: There is a component of chronic lung disease.  Mild interstitial edema may be present.  Small bilateral pleural effusions are present which appears smaller than on the chest x-ray on July 8.  Stable cardiomegaly.  No focal pulmonary consolidation.  IMPRESSION: Chronic lung disease with potential overlying mild interstitial edema.  Decrease in bilateral pleural effusions since the prior chest x-ray.  Original Report Authenticated By: Reola Calkins, M.D.   Nm  Pulmonary Per & Vent  09/28/2011  *RADIOLOGY REPORT*  Clinical Data:  Short of breath.  Rule out PE.  NUCLEAR MEDICINE PERFUSION LUNG SCAN  Technique:  Perfusion images were obtained in multiple projections after intravenous injection of radiopharmaceutical.  Radiopharmaceutical:  3.3 mCi technetium 80m MAA mCi Tc-32m MAA.  Comparison:  None.  Findings: Cardiomegaly is noted.  There is slight patchy perfusion within the left lung.  No definite segmental perfusion defects are identified.  IMPRESSION: No segmental perfusion defects.  The study is negative for pulmonary thromboembolism.  Original Report Authenticated By: Donavan Burnet, M.D.    EKG: Normal sinus rhythm, no ST/T wave changes  Debbora Presto, MD  [facility] Pager [phone]  If 7PM-7AM, please contact night-coverage [email or web address] Password [facility] 09/28/2011, 11:38 PM

## 2011-09-28 NOTE — ED Notes (Signed)
Pt in Afib HR 120s-140s. MD notified.

## 2011-09-28 NOTE — ED Notes (Signed)
Unable to start medication. Pump and channel not available. Charge, MD and pt and family made aware. Will continue to monitor pt.

## 2011-09-28 NOTE — ED Notes (Signed)
Venous study lower bilateral is negative per [place].

## 2011-09-28 NOTE — Progress Notes (Signed)
VASCULAR LAB PRELIMINARY  PRELIMINARY  PRELIMINARY  PRELIMINARY  Bilateral lower extremity venous duplex completed.    Preliminary report:  Bilateral:  No evidence of DVT, superficial thrombosis, or Baker's Cyst.   Debborah Alonge, RVS 09/28/2011, 7:20 PM

## 2011-09-29 ENCOUNTER — Encounter (HOSPITAL_COMMUNITY): Payer: Self-pay | Admitting: Cardiovascular Disease

## 2011-09-29 DIAGNOSIS — E785 Hyperlipidemia, unspecified: Secondary | ICD-10-CM

## 2011-09-29 DIAGNOSIS — I4891 Unspecified atrial fibrillation: Principal | ICD-10-CM

## 2011-09-29 DIAGNOSIS — E8770 Fluid overload, unspecified: Secondary | ICD-10-CM | POA: Insufficient documentation

## 2011-09-29 DIAGNOSIS — E8779 Other fluid overload: Secondary | ICD-10-CM

## 2011-09-29 DIAGNOSIS — I359 Nonrheumatic aortic valve disorder, unspecified: Secondary | ICD-10-CM

## 2011-09-29 DIAGNOSIS — I2510 Atherosclerotic heart disease of native coronary artery without angina pectoris: Secondary | ICD-10-CM | POA: Diagnosis present

## 2011-09-29 LAB — GLUCOSE, CAPILLARY: Glucose-Capillary: 85 mg/dL (ref 70–99)

## 2011-09-29 LAB — PRO B NATRIURETIC PEPTIDE: Pro B Natriuretic peptide (BNP): 2283 pg/mL — ABNORMAL HIGH (ref 0–450)

## 2011-09-29 LAB — BASIC METABOLIC PANEL
BUN: 10 mg/dL (ref 6–23)
CO2: 26 mEq/L (ref 19–32)
Chloride: 107 mEq/L (ref 96–112)
GFR calc non Af Amer: 43 mL/min — ABNORMAL LOW (ref >90)
Glucose, Bld: 91 mg/dL (ref 70–99)
Potassium: 4.2 mEq/L (ref 3.5–5.1)

## 2011-09-29 LAB — CBC
HCT: 27.4 % — ABNORMAL LOW (ref 36.0–46.0)
Hemoglobin: 9 g/dL — ABNORMAL LOW (ref 12.0–15.0)
MCH: 31.1 pg (ref 26.0–34.0)
MCHC: 32.8 g/dL (ref 30.0–36.0)
MCV: 94.8 fL (ref 78.0–100.0)

## 2011-09-29 LAB — URINE CULTURE: Colony Count: >=100000

## 2011-09-29 LAB — STOOL CULTURE

## 2011-09-29 LAB — CARDIAC PANEL(CRET KIN+CKTOT+MB+TROPI)
CK, MB: 2.4 ng/mL (ref 0.3–4.0)
CK, MB: 2.5 ng/mL (ref 0.3–4.0)
Relative Index: INVALID (ref 0.0–2.5)
Relative Index: INVALID (ref 0.0–2.5)
Total CK: 33 U/L (ref 7–177)

## 2011-09-29 LAB — MRSA PCR SCREENING: MRSA by PCR: NEGATIVE

## 2011-09-29 LAB — CULTURE, BLOOD (ROUTINE X 2)

## 2011-09-29 LAB — CORTISOL
Cortisol, Plasma: 18.4 ug/dL
Cortisol, Plasma: 24.6 ug/dL

## 2011-09-29 LAB — MAGNESIUM: Magnesium: 1.6 mg/dL (ref 1.5–2.5)

## 2011-09-29 LAB — SODIUM, URINE, RANDOM: Sodium, Ur: 59 mEq/L

## 2011-09-29 MED ORDER — RIVAROXABAN 15 MG PO TABS
15.0000 mg | ORAL_TABLET | Freq: Every day | ORAL | Status: DC
  Administered 2011-09-29 – 2011-09-30 (×2): 15 mg via ORAL
  Filled 2011-09-29 (×3): qty 1

## 2011-09-29 MED ORDER — ENOXAPARIN SODIUM 40 MG/0.4ML ~~LOC~~ SOLN: 40.0000 mg | Freq: Every day | SUBCUTANEOUS | Status: DC

## 2011-09-29 MED ORDER — POTASSIUM CHLORIDE CRYS ER 20 MEQ PO TBCR
40.0000 meq | EXTENDED_RELEASE_TABLET | Freq: Once | ORAL | Status: AC
  Administered 2011-09-29: 40 meq via ORAL
  Filled 2011-09-29: qty 2

## 2011-09-29 MED ORDER — BIOTENE DRY MOUTH MT LIQD
15.0000 mL | Freq: Two times a day (BID) | OROMUCOSAL | Status: DC
  Administered 2011-09-29 – 2011-10-06 (×11): 15 mL via OROMUCOSAL

## 2011-09-29 MED ORDER — FUROSEMIDE 10 MG/ML IJ SOLN
40.0000 mg | Freq: Once | INTRAMUSCULAR | Status: AC
  Administered 2011-09-29: 40 mg via INTRAVENOUS
  Filled 2011-09-29: qty 4

## 2011-09-29 MED ORDER — CLINDAMYCIN PHOSPHATE 300 MG/50ML IV SOLN
300.0000 mg | Freq: Two times a day (BID) | INTRAVENOUS | Status: DC
  Administered 2011-09-29 – 2011-10-04 (×11): 300 mg via INTRAVENOUS
  Filled 2011-09-29 (×16): qty 50

## 2011-09-29 MED ORDER — IPRATROPIUM BROMIDE 0.02 % IN SOLN: 0.5000 mg | Freq: Four times a day (QID) | RESPIRATORY_TRACT | Status: DC | PRN

## 2011-09-29 MED ORDER — DILTIAZEM HCL 100 MG IV SOLR
5.0000 mg/h | INTRAVENOUS | Status: DC
  Administered 2011-09-29 – 2011-09-30 (×2): 5 mg/h via INTRAVENOUS
  Filled 2011-09-29: qty 100

## 2011-09-29 MED ORDER — BIOTENE DRY MOUTH MT LIQD
15.0000 mL | Freq: Two times a day (BID) | OROMUCOSAL | Status: DC
  Administered 2011-09-29 – 2011-10-06 (×14): 15 mL via OROMUCOSAL

## 2011-09-29 NOTE — Consult Note (Addendum)
Patient ID: EDY MCBANE MRN: [id number] DOB/AGE: [id number] 76 y.o.  Admit date: 09/28/2011 Primary Physician: Ludwig Clarks Primary Cardiologist: She has been seen in the past by Dr. Peter Sw[place]and, cath per Dr. Swaziland 2005 Reason for Consultation:  Atrial fib with RVR  HPI:  78 yo female with history of CAD, HTN, HLD, COPD, bilateral carotid artery disease s/p bilateral CEA,  who is admitted with c/o progressively worsening shortness of breath that started several days prior to admission, poor oral intake, generalized fatigue, lower extremity swelling. She was found to be in atrial fibrillation. She was seen by primary care the day before admission for the symptoms and blood work done showed slightly worsening renal failure and elevated d- dimer, as well atrial fibrillation. V/Q scan 09/28/11 negative for PE. She has been managed with IV Cardizem. Heart rate this am is 115-120 with continued atrial fibrillation. Renal function is stable. Cardiac cath in 2005 per Dr. Peter [place]aziland with mild to moderate CAD. She had a recent prolonged hospitalization June 2013 for salmonella enteritis with septic shock.   She tells me that she feels better today. She is on IV cardizem at 5mg /hour. She has been given her home dose of Lasix 40 mg po Qdaily. She notes LE edema at home for one week. Dyspnea worsened over last week. NO chest pain or awareness of palpitations.   Tele: A. Fib with rate 110-120   Past Medical History  Diagnosis Date  . Hypertension   . Hyperlipidemia   . COPD (chronic obstructive pulmonary disease)   . Osteoporosis   . Arthritis   . H/O hiatal hernia   . Chronic back pain   . CAD (coronary artery disease)     Cath 2005 with moderate CAD, Dr. Swaziland  . Atrial fibrillation     No family history on file.  History   Social History  . Marital Status: Widowed    Spouse Name: N/A    Number of Children: N/A  . Years of Education: N/A   Occupational History  . Not on file.    Social History Main Topics  . Smoking status: Former Smoker -- 1.0 packs/day for 20 years    Types: Cigarettes    Quit date: 09/01/1983  . Smokeless tobacco: Never Used  . Alcohol Use: No  . Drug Use: No  . Sexually Active: No   Other Topics Concern  . Not on file   Social History Narrative  . No narrative on file    Past Surgical History  Procedure Date  . Carotid endarterectomy     bilateral  . Cataract extraction, bilateral   . Joint replacement 2008    Right Total Knee  . Anal fistula repair   . Left parotidectomy   . Eye surgery   . Flexible sigmoidoscopy 09/02/2011    Procedure: FLEXIBLE SIGMOIDOSCOPY;  Surgeon: Theda Belfast, MD;  Location: [facility];  Service: Endoscopy;  Laterality: N/A;  . Flexible sigmoidoscopy 09/03/2011    Procedure: FLEXIBLE SIGMOIDOSCOPY;  Surgeon: Theda Belfast, MD;  Location: [facility];  Service: Endoscopy;  Laterality: N/A;    Allergies  Allergen Reactions  . Demerol (Meperidine) Hives  . Penicillins Hives    Prescriptions prior to admission  Medication Sig Dispense Refill  . allopurinol (ZYLOPRIM) 100 MG tablet Take 100 mg by mouth daily.       Marland Kitchen atorvastatin (LIPITOR) 40 MG tablet Take 40 mg by mouth daily.      . colesevelam [facility]) 625  MG tablet Take 1,875 mg by mouth 2 (two) times daily with a meal.      . diltiazem (CARDIZEM CD) 240 MG 24 hr capsule Take 1 capsule (240 mg total) by mouth daily.  30 capsule  0  . Fluticasone-Salmeterol (ADVAIR) 250-50 MCG/DOSE AEPB Inhale 1 puff into the lungs 2 (two) times daily.  60 each  0  . furosemide (LASIX) 40 MG tablet Take 40 mg by mouth daily.      Marland Kitchen levalbuterol (XOPENEX HFA) 45 MCG/ACT inhaler Inhale 1-2 puffs into the lungs every 4 (four) hours as needed for wheezing.  1 Inhaler  12  . omega-3 acid ethyl esters (LOVAZA) 1 G capsule Take 1 g by mouth 2 (two) times daily.      . raloxifene (EVISTA) 60 MG tablet Take 60 mg by mouth daily.      . traZODone (DESYREL) 50 MG  tablet Take 25 mg by mouth at bedtime. Take 1/2 tablet at bedtime for sleep        Review of systems complete and found to be negative unless listed above    Physical Exam: Blood pressure 100/53, pulse 99, temperature 97.6 F (36.4 C), temperature source Oral, resp. rate 20, height 5\' 1"  (1.549 m), weight 181 lb 10.5 oz (82.4 kg), SpO2 98.00%.    General: Well developed, well nourished, NAD  HEENT: OP clear, mucus membranes moist  SKIN: warm, dry. No rashes.  Neuro: No focal deficits  Musculoskeletal: Muscle strength 5/5 all ext  Psychiatric: Mood and affect normal  Neck: No JVD, no carotid bruits, no thyromegaly, no lymphadenopathy.  Lungs:Clear bilaterally, no wheezes, rhonci, crackles  Cardiovascular: Irregular, tachy No murmurs, gallops or rubs.  Abdomen:Soft. Bowel sounds present. Non-tender.  Extremities: No lower extremity edema. Pulses are 2 + in the bilateral DP/PT.   Labs:   Lab Results  Component Value Date   WBC 8.4 09/29/2011   HGB 9.0* 09/29/2011   HCT 27.4* 09/29/2011   MCV 94.8 09/29/2011   PLT 200 09/29/2011    Lab 09/29/11 0818  NA 139  K 4.2  CL 107  CO2 26  BUN 10  CREATININE 1.13*  CALCIUM 8.7  PROT --  BILITOT --  ALKPHOS --  ALT --  AST --  GLUCOSE 91   Lab Results  Component Value Date   CKTOTAL 33 09/29/2011   CKMB 2.5 09/29/2011   TROPONINI <0.30 09/29/2011       Radiology: CXR 09/28/11:  Chronic lung disease with potential overlying mild interstitial  edema. Decrease in bilateral pleural effusions since the prior  chest x-ray.  EKG: Atrial fib, rate 129 bpm. NSSTTW changes  Cardiac Cath 11/24/03: Dr. Swaziland  The left coronary artery arises and distributes  normally.  The left main coronary artery is very short without significant  disease.   The left anterior descending artery has moderate disease proximally less  than 20%.  There is a small first diagonal branch which also has 20-30%  disease proximally.   The left circumflex  coronary artery is normal.   The right coronary artery is a very large, dominant vessel.  At the ostium  there is focal severe calcification of the ostium with approximately 40-50%  ostial stenosis.  There is 20% narrowing in the proximal right coronary  artery.  The remainder of the vessel is without significant disease.   LEFT VENTRICULAR ANGIOGRAPHY:  Performed in the RAO view demonstrates normal  left ventricular size and contractility with normal systolic function.  Ejection fraction is estimated at 60%.  There is no mitral regurgitation.  ASSESSMENT AND PLAN:   1. Atrial fibrillation with RVR: She has been switched to po Cardizem this am by primary team. Heart rate is still 120. Would continue IV cardizem as well. If her heart is consistently below 100, can d/c drip.  She seems to be a good candidate for long term anti-coagulation as she is very functional. Have discussed anti-coagulation with the patient and her daughter and they are in agreement. Will start Xarelto 15 mg po QDaily. (Creatinine clearance is 27)  2. Volume overload: ? CHF. Mild pulmonary edema on CXR yesterday. BNP elevated. No JVD. Trace to 1+ bilateral lower extremity edema. Will give one dose of IV lasix today and reassess in am. Check echo today to assess LV function. LVEF normal by cath in 2005.   3. Anemia: At baseline.   4. CAD: stable.  We will follow with you and will arrange f/u with Dr. Swaziland after discharge.    Arlan Birks  09/29/2011, 10:30 AM

## 2011-09-29 NOTE — Progress Notes (Signed)
CARE MANAGEMENT NOTE 09/29/2011  Patient:  [name] [name],[name] [name]   Account Number:  [id number]  Date Initiated:  09/29/2011  Documentation initiated by:  Torre Pikus  Subjective/Objective Assessment:   pt with new onset of a. fib. iv cardizem drip     Action/Plan:   from home   Anticipated DC Date:  10/02/2011   Anticipated DC Plan:  HOME/SELF CARE  In-house referral  NA      DC Planning Services  NA      [facility] Choice  NA   Choice offered to / List presented to:  NA   DME arranged  NA      DME agency  NA     HH arranged  NA      HH agency  NA   Status of service:  In process, will continue to follow Medicare Important Message given?  NA - LOS <3 / Initial given by admissions (If response is "NO", the following Medicare IM given date fields will be blank) Date Medicare IM given:   Date Additional Medicare IM given:    Discharge Disposition:    Per UR Regulation:  Reviewed for med. necessity/level of care/duration of stay  If discussed at Long Length of Stay Meetings, dates discussed:    Comments:  [id number]/Tyannah Sane Earlene Plater, RN, BSN, CCM: CHART REVIEWED AND UPDATED. NO DISCHARGE NEEDS PRESENT AT THIS TIME. CASE MANAGEMENT [phone]

## 2011-09-29 NOTE — Progress Notes (Signed)
  Echocardiogram 2D Echocardiogram has been performed.  [name] [name] 09/29/2011, 2:44 PM

## 2011-09-29 NOTE — Progress Notes (Signed)
Patient ID: [name] [name], female   DOB: 13-Jun-1923, 78 y.o.   MRN: [id number]  [facility] PROGRESS NOTE  [name] [name] [id number]:[id number] DOB: 07/16/1923 DOA: 09/28/2011 PCP: [name] Ok, MD  Brief narrative: Patient is 78 yo female who presented with main concern of progressively worsening shortness of breath that started several days prior to admission and have been associated with poor oral intake, generalized fatigue, lower extremity swelling. She was admitted 09/28/2011 for evaluation and management of shortness of breath and new onset atrial fibrillation . Assessment and Plan:  Principal Problem:  *Shortness of breath  - unclear etiology at this time and possibly related to atrial fibrillation in combination to mild diastolic CHF exacerbation (last 2D ECHO 08/2011 -> grade I diastolic CHF) - pt clinically improving and is maintaining oxygen saturation > 99% on 2L [place] - still need cardizem drip as HR above the target goal, plan on consulting cardiology today - will also continue home medication regimen with Cardizem PO - CE's x 2 sets are within normal limits, and TSH is also within normal limits  Active Problems:  Atrial fibrillation  - pt has no history of atrial fib  - continue cardizem drip  - cardiology consultation for further management  Leukocytosis  - unclear etiology but possibly related to left lower extremity cellulitis  - clinically improving - will continue clindamycin IV  - change to PO today or in AM  Anemia due to chronic illness  - Hg and Hct are at pt's baseline   Hypokalemia  - supplemented and within normal limits this AM - BMP in AM   Hyperlipidemia  - continue statin   Acute kidney injury - hold of on IVF ad mild congestion noted on CXR, pt had recent 2 d ECHO done 08/2011, diastolic grade I CHF - creatinine is around pt's baseline, please note upon recent discharge 07/11 creatinine was ~ 1.3 - This is likely pt's new baseline, 1.1 - 1.3 -  BMP in AM    Hyperglycemia - A1C 6.5 - dietary restrictions discussed  Consultants:  Cardiology   Procedures/Studies:  Dg Chest 2 View 09/28/2011   IMPRESSION:  Chronic lung disease with potential overlying mild interstitial edema.   Decrease in bilateral pleural effusions since the prior chest x-ray.     Nm Pulmonary Per & Vent 09/28/2011   IMPRESSION:  No segmental perfusion defects.   The study is negative for pulmonary thromboembolism.    Antibiotics:  Clindamycin IV 09/29/2011 -->  Code Status: Full Family Communication: Pt at bedside Disposition Plan: Home when medically stable  HPI/Subjective: No events overnight. Patient still feels some palpitations but overall feels better.  Objective: Filed Vitals:   09/29/11 0630 09/29/11 0645 09/29/11 0711 09/29/11 0800  BP: 109/45 95/52 95/51  100/53  Pulse: 118 105 89 99  Temp:    97.6 F (36.4 C)  TempSrc:    Oral  Resp: 18 21 23 20   Height:      Weight:      SpO2: 99% 99% 99% 98%    Intake/Output Summary (Last 24 hours) at 09/29/11 0950 Last data filed at 09/29/11 0600  Gross per 24 hour  Intake 311.59 ml  Output      0 ml  Net 311.59 ml    Exam:   General:  Pt is alert, follows commands appropriately, not in acute distress  Cardiovascular: Irregular rate and rhythm, tachycardic, no murmurs, no rubs, no gallops  Respiratory: Clear to auscultation bilaterally but slightly diminished sounds  at bases, no wheezing, no crackles, no rhonchi  Abdomen: Soft, non tender, non distended, bowel sounds present, no guarding  Extremities: No edema in the right lower extremity, pulses DP and PT palpable bilaterally, left lower extremity erythema and edema much improved and no warmth tot touch  Neuro: Grossly nonfocal  Data Reviewed: Basic Metabolic Panel:  Lab 09/29/11 0865 09/29/11 0008 09/28/11 2200  NA 139 -- 136  K 4.2 -- 3.2*  CL 107 -- 101  CO2 26 -- 25  GLUCOSE 91 -- 93  BUN 10 -- 10  CREATININE  1.13* -- 1.14*  CALCIUM 8.7 -- 9.2  MG -- 1.6 --  PHOS -- 3.5 --   CBC:  Lab 09/29/11 0818 09/28/11 2200  WBC 8.4 12.3*  NEUTROABS -- --  HGB 9.0* 8.9*  HCT 27.4* 27.8*  MCV 94.8 93.9  PLT 200 211   Cardiac Enzymes:  Lab 09/29/11 0818 09/29/11 0008  CKTOTAL 33 42  CKMB 2.5 2.4  CKMBINDEX -- --  TROPONINI <0.30 <0.30   CBG:  Lab 09/29/11 0731  GLUCAP 85    Recent Results (from the past 240 hour(s))  MRSA PCR SCREENING     Status: Normal   Collection Time   09/29/11  3:28 AM      Component Value Range Status Comment   MRSA by PCR NEGATIVE  NEGATIVE Final      Scheduled Meds:   . allopurinol  100 mg Oral Daily  . atorvastatin  40 mg Oral Daily  . Clindamycin IV  300 mg Intravenous Q12H  . colesevelam  1,875 mg Oral BID WC  . diltiazem  240 mg Oral Daily  . diltiazem  10 mg Intravenous Once  . enoxaparINE injection  30 mg Subcutaneous Q24H  . Fluticasone-Salmeterol  1 puff Inhalation BID  . furosemide  40 mg Oral Daily  . potassium chloride  40 mEq Oral Once  . raloxifene  60 mg Oral Daily  . traZODone  25 mg Oral QHS   Continuous Infusions:   . diltiazem (CARDIZEM) infusion 5 mg/hr (09/29/11 0800)     Manson Passey, MD  [facility] Pager [phone]  If 7PM-7AM, please contact night-coverage [email or web address] Password [email or web address] 09/29/2011, 9:50 AM   LOS: 1 day

## 2011-09-30 DIAGNOSIS — E876 Hypokalemia: Secondary | ICD-10-CM

## 2011-09-30 LAB — GLUCOSE, CAPILLARY: Glucose-Capillary: 79 mg/dL (ref 70–99)

## 2011-09-30 MED ORDER — DIPHENHYDRAMINE-ZINC ACETATE 2-0.1 % EX CREA
TOPICAL_CREAM | Freq: Two times a day (BID) | CUTANEOUS | Status: DC | PRN
  Administered 2011-09-30: 1 via TOPICAL
  Filled 2011-09-30: qty 28

## 2011-09-30 MED ORDER — DILTIAZEM HCL ER COATED BEADS 300 MG PO CP24
300.0000 mg | ORAL_CAPSULE | Freq: Every day | ORAL | Status: DC
  Administered 2011-09-30 – 2011-10-06 (×7): 300 mg via ORAL
  Filled 2011-09-30 (×8): qty 1

## 2011-09-30 NOTE — Evaluation (Signed)
Physical Therapy Evaluation Patient Details Name: [name] [name] MRN: [id number] DOB: [birth year] Today's Date: 09/30/2011 Time: 7829-5621 PT Time Calculation (min): 17 min  PT Assessment / Plan / Recommendation Clinical Impression  78 yo female admitted with Afib with RVR, SOB. HR 115-147 with ativity. Plan is for pt to return to daughter's home at discharge. Recommend pt resume HHPT at discharge.    PT Assessment  Patient needs continued PT services    Follow Up Recommendations  Home health PT    Barriers to Discharge        Equipment Recommendations  None recommended by PT    Recommendations for Other Services OT consult   Frequency Min 3X/week    Precautions / Restrictions Precautions Precautions: Fall Restrictions Weight Bearing Restrictions: No   Pertinent Vitals/Pain       Mobility  Bed Mobility Bed Mobility: Not assessed Transfers Transfers: Sit to Stand;Stand to Sit Sit to Stand: 4: Min guard;With upper extremity assist;From chair/3-in-1;With armrests Stand to Sit: 4: Min guard;With armrests;With upper extremity assist Details for Transfer Assistance: VCs safety, technique, hand placement. Assist to rise, stabilize, control descent Ambulation/Gait Ambulation/Gait Assistance: 4: Min guard Ambulation Distance (Feet): 100 Feet Assistive device: Rolling walker Ambulation/Gait Assistance Details: HR between 115-147 during ambulation. Gait Pattern: Step-through pattern    Exercises     PT Diagnosis: Difficulty walking  PT Problem List: Decreased activity tolerance;Decreased mobility;Cardiopulmonary status limiting activity PT Treatment Interventions: Gait training;Functional mobility training;Therapeutic activities;Patient/family education   PT Goals Acute Rehab PT Goals PT Goal Formulation: With patient/family Time For Goal Achievement: 10/07/11 Potential to Achieve Goals: Good Pt will go Supine/Side to Sit: with modified independence PT Goal:  Supine/Side to Sit - Progress: Goal set today Pt will go Sit to Supine/Side: with modified independence PT Goal: Sit to Supine/Side - Progress: Goal set today Pt will go Sit to Stand: with modified independence PT Goal: Sit to Stand - Progress: Goal set today Pt will Ambulate: 51 - 150 feet;with supervision;with least restrictive assistive device PT Goal: Ambulate - Progress: Goal set today  Visit Information  Last PT Received On: 09/30/11 Assistance Needed: +1    Subjective Data  Subjective: "I'm ready to walk" Patient Stated Goal: Back home.    Prior Functioning  Home Living Lives With: Daughter Available Help at Discharge: Family Type of Home: House Home Access: Stairs to enter Secretary/administrator of Steps: 4 Home Layout: One level Home Adaptive Equipment: Walker - rolling;Straight cane;Bedside commode/3-in-1;Shower chair with back Prior Function Level of Independence: Needs assistance Needs Assistance: Bathing;Dressing Bath: Minimal Dressing: Minimal Able to Take Stairs?: Yes Communication Communication: No difficulties    Cognition  Overall Cognitive Status: Appears within functional limits for tasks assessed/performed Arousal/Alertness: Awake/alert Orientation Level: Appears intact for tasks assessed Behavior During Session: [facility] for tasks performed    Extremity/Trunk Assessment Right Lower Extremity Assessment RLE ROM/Strength/Tone: [facility] for tasks assessed Left Lower Extremity Assessment LLE ROM/Strength/Tone: [facility] for tasks assessed   Balance    End of Session PT - End of Session Equipment Utilized During Treatment: Gait belt;Oxygen Activity Tolerance: Patient tolerated treatment well Patient left: in chair;with call bell/phone within reach;with family/visitor present  GP     Rebeca Alert [facility] 09/30/2011, 1:32 PM [phone]

## 2011-09-30 NOTE — Progress Notes (Signed)
While sleeping, sats drop to 90-91% on 3L [place]

## 2011-09-30 NOTE — Progress Notes (Signed)
Patient ID: [name] [name], female   DOB: 1923/10/11, 78 y.o.   MRN: [id number]  [facility] PROGRESS NOTE  [name] [name] [id number] DOB: 06-06-1923 DOA: 09/28/2011 PCP: [name], MD  Brief narrative:  Patient is 78 yo female who presented with main concern of progressively worsening shortness of breath that started several days prior to admission and have been associated with poor oral intake, generalized fatigue, lower extremity swelling. She was admitted 09/28/2011 for evaluation and management of shortness of breath and new onset atrial fibrillation. .  Assessment and Plan:  Principal Problem:  *Shortness of breath, secondary to atrial fibrillation and acute on chronic systolic and diastolic dysfunction - related to atrial fibrillation in combination to mild diastolic CHF exacerbation (last 2D ECHO 08/2011 -> grade I diastolic CHF)  - new 2 D ECHO indicative of slightly lower EF ~ 45 - 50% - pt clinically improving and is maintaining oxygen saturation > 99% on 2L [place]  - appreciate cardiology input, will plant to transfer to telemetry today if able to taper Cardizem drip - increase the dose of Cardizem PO to 30 mg PO - CE's x 3 sets are within normal limits, and TSH is also within normal limits   Active Problems:  Atrial fibrillation  - pt has no history of atrial fib  - plan to transition off cardizem drip and increase the PO dose to 300 mg - transfer to telemetry  Leukocytosis  - unclear etiology but possibly related to left lower extremity cellulitis  - clinically improving  - will continue clindamycin IV   Anemia due to chronic illness  - Hg and Hct are at pt's baseline  - CBC in AM  Hypokalemia  - supplemented and within normal limits this AM  - BMP in AM   Hyperlipidemia  - continue statin   Acute kidney injury  - hold of on IVF ad mild congestion noted on CXR, pt had recent 2 d ECHO done 08/2011, diastolic grade I CHF - creatinine is around pt's  baseline, please note upon recent discharge 07/11 creatinine was ~ 1.3  - This is likely pt's new baseline, 1.1 - 1.3  - pt maintaining good urine output - BMP in AM   Hyperglycemia  - A1C 6.5  - dietary restrictions discussed   Consultants:  Cardiology   Procedures/Studies:   Dg Chest 2 View  09/28/2011   IMPRESSION:   Chronic lung disease with potential overlying mild interstitial edema.   Decrease in bilateral pleural effusions since the prior chest x-ray.   Nm Pulmonary Per & Vent  09/28/2011   IMPRESSION:   No segmental perfusion defects.   The study is negative for pulmonary thromboembolism.    2 D ECHO 09/28/2011 -->  Normal LV size with mild LV hypertrophy.   EF 45-50% with mild global hypokinesis.   Mild to moderate AS. .  Mild pulmonary hypertension.  The subcostal images raise concern for a small secundum ASD. Would consider TEE to confirm or refute.  Antibiotics:  Clindamycin IV 09/29/2011 -->  Code Status: Full  Family Communication: Updated daughter [name] at bedside 09/29/2011 Disposition Plan: Home when medically stable   HPI/Subjective:  No events overnight. Patient still feels some palpitations but overall feels better.  Objective: Filed Vitals:   09/30/11 0500 09/30/11 0600 09/30/11 0800 09/30/11 0813  BP:  101/48 108/54   Pulse:  112 108   Temp:   97.9 F (36.6 C)   TempSrc:   Oral   Resp:  20 24   Height:      Weight: 180 lb 5.4 oz (81.8 kg)     SpO2:  99% 99% 98%    Intake/Output Summary (Last 24 hours) at 09/30/11 1020 Last data filed at 09/30/11 0800  Gross per 24 hour  Intake    635 ml  Output   2002 ml  Net  -1367 ml    Exam:   General:  Pt is alert, follows commands appropriately, not in acute distress  Cardiovascular: Irregular rate and rhythm, soft SEM, no rubs, no gallops  Respiratory: Clear to auscultation bilaterally, no wheezing, no crackles, no rhonchi  Abdomen: Soft, non tender, non distended, bowel sounds  present, no guarding  Extremities: trace bilateral pitting edema, pulses DP and PT palpable bilaterally  Neuro: Grossly nonfocal  Data Reviewed: Basic Metabolic Panel:  Lab 09/29/11 4540 09/29/11 0008  NA 139 --  K 4.2 --  CL 107 --  CO2 26 --  GLUCOSE 91 --  BUN 10 --  CREATININE 1.13* --  CALCIUM 8.7 --  MG -- 1.6  PHOS -- 3.5   CBC:  Lab 09/29/11 0818 09/28/11 2200  WBC 8.4 12.3*  NEUTROABS -- --  HGB 9.0* 8.9*  HCT 27.4* 27.8*  MCV 94.8 93.9  PLT 200 211   Cardiac Enzymes:  Lab 09/29/11 1620 09/29/11 0818 09/29/11 0008  CKTOTAL 32 33 42  CKMB 2.4 2.5 2.4  CKMBINDEX -- -- --  TROPONINI <0.30 <0.30 <0.30   CBG:  Lab 09/30/11 0735 09/29/11 0731  GLUCAP 79 85    Recent Results (from the past 240 hour(s))  MRSA PCR SCREENING     Status: Normal   Collection Time   09/29/11  3:28 AM      Component Value Range Status Comment   MRSA by PCR NEGATIVE  NEGATIVE Final      Scheduled Meds:   . allopurinol  100 mg Oral Daily  . atorvastatin  40 mg Oral Daily  . clindamycin IV  300 mg Intravenous Q12H  . colesevelam  1,875 mg Oral BID WC  . diltiazem  300 mg Oral Daily  . Fluticasone-Salmeterol  1 puff Inhalation BID  . furosemide  40 mg Intravenous Once  . raloxifene  60 mg Oral Daily  . rivaroxaban  15 mg Oral Q supper  . traZODone  25 mg Oral QHS   Continuous Infusions:   . diltiazem (CARDIZEM) infusion 5 mg (09/30/11 0800)     Manson Passey, MD  [facility] Pager [phone]  If 7PM-7AM, please contact night-coverage [email or web address] Password [email or web address] 09/30/2011, 10:20 AM   LOS: 2 days

## 2011-09-30 NOTE — Progress Notes (Signed)
Subjective:  Patient feels better this am.  No chest pain or dyspnea. Edema is resolved. Remains in atrial fib with VR not adequately controlled yet.  Objective:  Vital Signs in the last 24 hours: Temp:  [97.5 F (36.4 C)-98.8 F (37.1 C)] 97.5 F (36.4 C) (07/26 0400) Pulse Rate:  [60-131] 112  (07/26 0600) Resp:  [18-29] 20  (07/26 0600) BP: (78-115)/(41-72) 101/48 mmHg (07/26 0600) SpO2:  [95 %-100 %] 99 % (07/26 0600) FiO2 (%):  [4 %] 4 % (07/25 1400) Weight:  [180 lb 5.4 oz (81.8 kg)] 180 lb 5.4 oz (81.8 kg) (07/26 0500)  Intake/Output from previous day: 07/25 0701 - 07/26 0700 In: 320 [P.O.:100; I.V.:120; IV Piggyback:100] Out: 1702 [Urine:1701; Stool:1] Intake/Output from this shift:       . allopurinol  100 mg Oral Daily  . antiseptic oral rinse  15 mL Mouth Rinse q12n4p  . antiseptic oral rinse  15 mL Mouth Rinse BID  . atorvastatin  40 mg Oral Daily  . clindamycin (CLEOCIN) IV  300 mg Intravenous Q12H  . colesevelam  1,875 mg Oral BID WC  . diltiazem  240 mg Oral Daily  . Fluticasone-Salmeterol  1 puff Inhalation BID  . furosemide  40 mg Intravenous Once  . raloxifene  60 mg Oral Daily  . rivaroxaban  15 mg Oral Q supper  . traZODone  25 mg Oral QHS  . DISCONTD: enoxaparin (LOVENOX) injection  30 mg Subcutaneous Q24H  . DISCONTD: enoxaparin (LOVENOX) injection  40 mg Subcutaneous Daily  . DISCONTD: furosemide  40 mg Oral Daily      . diltiazem (CARDIZEM) infusion 5 mg/hr (09/30/11 9604)  . DISCONTD: diltiazem (CARDIZEM) infusion 5 mg/hr (09/29/11 0800)    Physical Exam: The patient appears to be in no distress.  Head and neck exam reveals that the pupils are equal and reactive.  The extraocular movements are full.  There is no scleral icterus.  Mouth and pharynx are benign.  No lymphadenopathy.  No carotid bruits.  The jugular venous pressure is normal.  Thyroid is not enlarged or tender.  Chest is clear to percussion and auscultation.  No rales or  rhonchi.  Expansion of the chest is symmetrical.  Heart reveals soft basilar systolic murmur. No diastolic murmur or gallop.  The abdomen is soft and nontender.  Bowel sounds are normoactive.  There is no hepatosplenomegaly or mass.  There are no abdominal bruits.  Extremities reveal no phlebitis or edema.  Pedal pulses are good.  There is no cyanosis or clubbing.  Neurologic exam is normal strength and no lateralizing weakness.  No sensory deficits.  Integument reveals no rash  Lab Results:  Basename 09/29/11 0818 09/28/11 2200  WBC 8.4 12.3*  HGB 9.0* 8.9*  PLT 200 211    Basename 09/29/11 0818 09/28/11 2200  NA 139 136  K 4.2 3.2*  CL 107 101  CO2 26 25  GLUCOSE 91 93  BUN 10 10  CREATININE 1.13* 1.14*    Basename 09/29/11 1620 09/29/11 0818  TROPONINI <0.30 <0.30   Hepatic Function Panel No results found for this basename: PROT,ALBUMIN,AST,ALT,ALKPHOS,BILITOT,BILIDIR,IBILI in the last 72 hours No results found for this basename: CHOL in the last 72 hours No results found for this basename: PROTIME in the last 72 hours  Imaging: Dg Chest 2 View  09/28/2011  *RADIOLOGY REPORT*  Clinical Data: Shortness of breath and bilateral lower extremity edema.  CHEST - 2 VIEW  Comparison: 09/12/2011  Findings: There  is a component of chronic lung disease.  Mild interstitial edema may be present.  Small bilateral pleural effusions are present which appears smaller than on the chest x-ray on July 8.  Stable cardiomegaly.  No focal pulmonary consolidation.  IMPRESSION: Chronic lung disease with potential overlying mild interstitial edema.  Decrease in bilateral pleural effusions since the prior chest x-ray.  Original Report Authenticated By: Reola Calkins, M.D.   Nm Pulmonary Per & Vent  09/28/2011  *RADIOLOGY REPORT*  Clinical Data:  Short of breath.  Rule out PE.  NUCLEAR MEDICINE PERFUSION LUNG SCAN  Technique:  Perfusion images were obtained in multiple projections after  intravenous injection of radiopharmaceutical.  Radiopharmaceutical:  3.3 mCi technetium 21m MAA mCi Tc-41m MAA.  Comparison:  None.  Findings: Cardiomegaly is noted.  There is slight patchy perfusion within the left lung.  No definite segmental perfusion defects are identified.  IMPRESSION: No segmental perfusion defects.  The study is negative for pulmonary thromboembolism.  Original Report Authenticated By: Donavan Burnet, M.D.    Cardiac Studies: 2D echo shows EF 45-50 % with mild diffuse hypokinesis. Mild to moderate AS. Bi-atrial enlargement. Telemetry shows atrial fib with rates 115 Assessment/Plan:   Atrial fibrillation (09/28/2011)   Assessment: Heart rate not adequately controlled yet.  Plan will be rate control and anticoagulation with Xarelto.  Will increase diltiazem to 300 mg daily. Should be able to DC IV cardizem later today after oral dose kicks in. OK to transfer to tele from cardiology standpoint       LOS: 2 days    Cassell Clement 09/30/2011, 7:28 AM

## 2011-10-01 ENCOUNTER — Inpatient Hospital Stay (HOSPITAL_COMMUNITY): Payer: Medicare Other

## 2011-10-01 LAB — CBC
HCT: 25.9 % — ABNORMAL LOW (ref 36.0–46.0)
MCH: 30.3 pg (ref 26.0–34.0)
MCHC: 32.1 g/dL (ref 30.0–36.0)
MCV: 94.5 fL (ref 78.0–100.0)
MCV: 95.7 fL (ref 78.0–100.0)
Platelets: 221 10*3/uL (ref 150–400)
Platelets: 265 10*3/uL (ref 150–400)
RDW: 14.3 % (ref 11.5–15.5)
RDW: 14.2 % (ref 11.5–15.5)
WBC: 8.6 10*3/uL (ref 4.0–10.5)

## 2011-10-01 LAB — BASIC METABOLIC PANEL
BUN: 9 mg/dL (ref 6–23)
CO2: 25 mEq/L (ref 19–32)
Calcium: 8.2 mg/dL — ABNORMAL LOW (ref 8.4–10.5)
Creatinine, Ser: 1.01 mg/dL (ref 0.50–1.10)
GFR calc Af Amer: 57 mL/min — ABNORMAL LOW (ref >90)

## 2011-10-01 LAB — OCCULT BLOOD X 1 CARD TO LAB, STOOL: Fecal Occult Bld: NEGATIVE

## 2011-10-01 LAB — PRO B NATRIURETIC PEPTIDE: Pro B Natriuretic peptide (BNP): 1563 pg/mL — ABNORMAL HIGH (ref 0–450)

## 2011-10-01 LAB — GLUCOSE, CAPILLARY

## 2011-10-01 LAB — TYPE AND SCREEN

## 2011-10-01 MED ORDER — SALINE SPRAY 0.65 % NA SOLN
1.0000 | NASAL | Status: DC | PRN
  Filled 2011-10-01: qty 44

## 2011-10-01 MED ORDER — FUROSEMIDE 10 MG/ML IJ SOLN
40.0000 mg | Freq: Every day | INTRAMUSCULAR | Status: DC
  Administered 2011-10-01 – 2011-10-03 (×3): 40 mg via INTRAVENOUS
  Filled 2011-10-01 (×3): qty 4

## 2011-10-01 MED ORDER — DIGOXIN 250 MCG PO TABS
0.2500 mg | ORAL_TABLET | Freq: Two times a day (BID) | ORAL | Status: AC
  Administered 2011-10-01 (×2): 0.25 mg via ORAL
  Filled 2011-10-01 (×2): qty 1

## 2011-10-01 MED ORDER — VITAMINS A & D EX OINT
TOPICAL_OINTMENT | CUTANEOUS | Status: AC
  Administered 2011-10-01: 5
  Filled 2011-10-01: qty 5

## 2011-10-01 MED ORDER — DIGOXIN 250 MCG PO TABS
0.2500 mg | ORAL_TABLET | Freq: Every day | ORAL | Status: AC
  Administered 2011-10-02: 0.25 mg via ORAL
  Filled 2011-10-01: qty 1

## 2011-10-01 MED ORDER — DIGOXIN 125 MCG PO TABS
0.1250 mg | ORAL_TABLET | Freq: Every day | ORAL | Status: DC
  Administered 2011-10-03 – 2011-10-04 (×2): 0.125 mg via ORAL
  Filled 2011-10-01 (×3): qty 1

## 2011-10-01 NOTE — Progress Notes (Signed)
SUBJECTIVE:  Feels ok.  HR increased this AM around 7:30  OBJECTIVE:   Vitals:   Filed Vitals:   09/30/11 2122 09/30/11 2327 10/01/11 0628 10/01/11 0800  BP:  134/66 115/74 126/84  Pulse:   120 125  Temp:  97.9 F (36.6 C) 97.5 F (36.4 C)   TempSrc:  Oral Oral   Resp: 22  20 22   Height:      Weight:   82.8 kg (182 lb 8.7 oz)   SpO2:  97% 96% 98%   I&O's:   Intake/Output Summary (Last 24 hours) at 10/01/11 0847 Last data filed at 10/01/11 0600  Gross per 24 hour  Intake    845 ml  Output      0 ml  Net    845 ml   TELEMETRY: Reviewed telemetry pt in AFib with RVR:     PHYSICAL EXAM General: Well developed, well nourished, in no acute distress Head:   Normal cephalic and atramatic  Lungs:   Clear bilaterally to auscultation Heart:   Tachycardic, irregularly irregular             Abdomen: obese Msk:  Back normal, normal gait. Normal strength and tone for age. Extremities:   No edema.   Neuro: Alert and oriented X 3. Psych:  Normal affect, responds appropriately   LABS: Basic Metabolic Panel:  Basename 10/01/11 0718 09/29/11 0818 09/29/11 0008  NA 137 139 --  K 4.3 4.2 --  CL 105 107 --  CO2 25 26 --  GLUCOSE 93 91 --  BUN 9 10 --  CREATININE 1.01 1.13* --  CALCIUM 8.2* 8.7 --  MG -- -- 1.6  PHOS -- -- 3.5   Liver Function Tests: No results found for this basename: AST:2,ALT:2,ALKPHOS:2,BILITOT:2,PROT:2,ALBUMIN:2 in the last 72 hours No results found for this basename: LIPASE:2,AMYLASE:2 in the last 72 hours CBC:  Basename 10/01/11 0718 09/29/11 0818  WBC 6.4 8.4  NEUTROABS -- --  HGB 8.3* 9.0*  HCT 25.9* 27.4*  MCV 94.5 94.8  PLT 221 200   Cardiac Enzymes:  Basename 09/29/11 1620 09/29/11 0818 09/29/11 0008  CKTOTAL 32 33 42  CKMB 2.4 2.5 2.4  CKMBINDEX -- -- --  TROPONINI <0.30 <0.30 <0.30   BNP: No components found with this basename: POCBNP:3 D-Dimer: No results found for this basename: DDIMER:2 in the last 72 hours Hemoglobin  A1C:  Basename 09/29/11 0008  HGBA1C 6.4*   Fasting Lipid Panel: No results found for this basename: CHOL,HDL,LDLCALC,TRIG,CHOLHDL,LDLDIRECT in the last 72 hours Thyroid Function Tests:  Basename 09/29/11 0008  TSH 1.026  T4TOTAL --  T3FREE --  THYROIDAB --   Anemia Panel: No results found for this basename: VITAMINB12,FOLATE,FERRITIN,TIBC,IRON,RETICCTPCT in the last 72 hours Coag Panel:   Lab Results  Component Value Date   INR 0.98 02/04/2009   INR 2.8* 04/18/2007   INR 1.7* 04/13/2007    RADIOLOGY: Dg Chest 1 View  09/09/2011  *RADIOLOGY REPORT*  Clinical Data: Cough.  Possible aspiration.  CHEST - 1 VIEW  Comparison: 09/06/2011.  09/03/2011.  Findings: Artifact overlies chest.  Right internal jugular central line has its tip in the SVC just above the right atrium.  There is patchy density in both lower lobes left worse than right.  This could be atelectasis or pneumonia.  Findings are similar to the study of 06/29.  Upper lobes remain clear.  IMPRESSION: Patchy atelectasis and/or infiltrate at both lung bases left more than right.  Similar appearance to the study of  06/29.  Original Report Authenticated By: Thomasenia Sales, M.D.   Dg Chest 1 View  09/03/2011  *RADIOLOGY REPORT*  Clinical Data: Shortness of breath, wheezing  CHEST - 1 VIEW  Comparison: 09/02/2011  Findings: Cardiomegaly.  Pulmonary vascular congestion without frank interstitial edema.  Mild bilateral lower lobe opacities, possibly atelectasis, left lower lobe pneumonia not entirely excluded.  Stable right IJ venous catheter.  Surgical clips in the right neck.  IMPRESSION: Cardiomegaly.  Pulmonary vascular congestion without frank interstitial edema.  Mild bilateral lower lobe opacities, possibly atelectasis, left lower lobe pneumonia not entirely excluded.  Original Report Authenticated By: Charline Bills, M.D.   Dg Chest 2 View  09/28/2011  *RADIOLOGY REPORT*  Clinical Data: Shortness of breath and bilateral lower  extremity edema.  CHEST - 2 VIEW  Comparison: 09/12/2011  Findings: There is a component of chronic lung disease.  Mild interstitial edema may be present.  Small bilateral pleural effusions are present which appears smaller than on the chest x-ray on July 8.  Stable cardiomegaly.  No focal pulmonary consolidation.  IMPRESSION: Chronic lung disease with potential overlying mild interstitial edema.  Decrease in bilateral pleural effusions since the prior chest x-ray.  Original Report Authenticated By: Reola Calkins, M.D.   Dg Chest 2 View  09/12/2011  *RADIOLOGY REPORT*  Clinical Data: 78 year old female with cough.  Renal failure, septic shock.  CHEST - 2 VIEW  Comparison: 09/09/2011 and earlier.  Findings: Upright AP and lateral views of the chest.  Stable right IJ central line.  Stable right neck surgical clips.  Stable lung volumes.  Small to moderate bilateral pleural effusions.  Continued volume loss at the right base and confluent right retrocardiac opacity.  No pneumothorax or edema.  Stable cardiac size and mediastinal contours.  Stable visualized osseous structures.  IMPRESSION: Bilateral pleural effusions and ongoing right lung base collapse or consolidation.  Appearance suspicious for right lower lobe pneumonia.  Original Report Authenticated By: Harley Hallmark, M.D.   Ct Chest Wo Contrast  09/13/2011  *RADIOLOGY REPORT*  Clinical Data: Bilateral pleural effusions and right lung collapse or consolidation on chest radiograph yesterday.  CT CHEST WITHOUT CONTRAST  Technique:  Multidetector CT imaging of the chest was performed following the standard protocol without IV contrast.  Comparison: Chest radiographs obtained yesterday.  Findings: Right jugular catheter tip at the junction of the superior vena cava and right atrium.  Small to moderate-sized right pleural effusion.  Adjacent right lower lobe airspace consolidation with an appearance most compatible with atelectasis.  Minimal left pleural  effusion with adjacent left lower lobe atelectasis. Enlarged heart.  Coronary artery calcifications.  Thoracic spine degenerative changes.  Unremarkable upper abdomen.  IMPRESSION:  1.  Bilateral pleural effusions with associated bilateral lower lobe atelectasis, greater on the right. 2.  Cardiomegaly. 3.  Atheromatous coronary artery calcifications.  Original Report Authenticated By: Darrol Angel, M.D.   Nm Pulmonary Per & Vent  09/28/2011  *RADIOLOGY REPORT*  Clinical Data:  Short of breath.  Rule out PE.  NUCLEAR MEDICINE PERFUSION LUNG SCAN  Technique:  Perfusion images were obtained in multiple projections after intravenous injection of radiopharmaceutical.  Radiopharmaceutical:  3.3 mCi technetium 86m MAA mCi Tc-60m MAA.  Comparison:  None.  Findings: Cardiomegaly is noted.  There is slight patchy perfusion within the left lung.  No definite segmental perfusion defects are identified.  IMPRESSION: No segmental perfusion defects.  The study is negative for pulmonary thromboembolism.  Original Report Authenticated By: Marlowe Aschoff  HOSS, M.D.   US Renal Port  09/05/2011  *RADIOLOGY REPORT*  Clinical Data:  Acute renal insufficiency  RENAL/URINARY TRACT ULTRASOUND COMPLETE  Comparison:  08/31/2011  Findings:  Right Kidney:  Measures 10.4 cm. Normal echogenicity.  There is mild scarring and volume loss involving the inferior pole.  No evidence of mass or hydronephrosis.  Left Kidney:  Measures 11.1 cm.  Normal in size and parenchymal echogenicity.  No evidence of mass or hydronephrosis.  Bladder:  Appears normal for degree of bladder distention.  IMPRESSION:  1.  No evidence for obstructive uropathy. 2.  Right renal scarring  Original Report Authenticated By: Rosealee Albee, M.D.   Dg Chest Port 1 View  09/06/2011  *RADIOLOGY REPORT*  Clinical Data: Congestive heart failure  PORTABLE CHEST - 1 VIEW  Comparison: Chest radiograph 09/03/2011  Findings: Right central venous line is in place.  Stable cardiac  silhouette.  Central venous pulmonary congestion is increased compared to prior. Interstitial edema is present.  Lung bases are excluded.  No pneumothorax.  IMPRESSION: Increased central venous congestion and interstitial edema.  Original Report Authenticated By: Genevive Bi, M.D.   Dg Chest Port 1 View  09/02/2011  *RADIOLOGY REPORT*  Clinical Data: Central line placement.  PORTABLE CHEST - 1 VIEW  Comparison: 08/31/2011  Findings: Right central line tip at the cavoatrial junction.  No pneumothorax.  Cardiomegaly.  Left base atelectasis or infiltrate. Right lung is clear.  Heart is normal size.  No overt edema.  IMPRESSION: Right central line tip at the cavoatrial junction.  No pneumothorax.  Left lower lobe atelectasis or infiltrate.  Original Report Authenticated By: Cyndie Chime, M.D.      ASSESSMENT: AFib with RVR, anemia  PLAN:  HR has increased this AM despite Cardizem.  HR likely exacerbated by worsening anemia.  She reports some dark stool.  Check for blood.  Stop Xarelto at this time until blood count stabilizes.  Could consider digoxin in addition to cardizem for rate control.    Mild LV dysfunction.  WIll have to watch for pulmonary edema with fast heart rate in the setting of lower EF.  Corky Crafts., MD  10/01/2011  8:47 AM

## 2011-10-01 NOTE — Progress Notes (Signed)
Patient heart rate  fluctuating between120-150s, patient denies any chest pain/distress, in bed resting. Patient has 7beats of V-tach last night.  Vitals 126/84 HR-125 resp.22, Dr Eldridge Dace notified on the unit to assess patient, Dr. Elisabeth Pigeon informed as well. Will continue to asses patient.

## 2011-10-01 NOTE — Progress Notes (Signed)
[facility] PROGRESS NOTE  OMEGA DURANTE ZOX:[id number] DOB: [birth year] DOA: 09/28/2011 PCP: [name], MD  Brief narrative:  Patient is 78 yo female who presented with main concern of progressively worsening shortness of breath that started several days prior to admission and have been associated with poor oral intake, generalized fatigue, lower extremity swelling. She was admitted 09/28/2011 for evaluation and management of shortness of breath and new onset atrial fibrillation.  .  Assessment and Plan:   Principal Problem:  *Shortness of breath, secondary to atrial fibrillation and acute on chronic systolic and diastolic dysfunction  - related to atrial fibrillation in combination to diastolic and systolic CHF, acute on chronic - based on this admission's 2 D ECHO EF 40-45% and elevated BNP - will start lasix 40 mg IV daily - daily weigth and strict intake and output - CE's x 3 sets are within normal limits, and TSH is also within normal limits  - due to more pronounced shortness of breath today we have obtained CXR with findings of possible interstitial edema; as mentioned above we will start daily IV lasix; monitor renal function  Active Problems:  Atrial fibrillation  - pt has no history of atrial fib  - now solely on cardizem 300 mg PO daily, per cardiology recommendation patient may require digoxin for better rate control  Leukocytosis  - secondary to left lower extremity cellulitis  - clinically improving  - continue clindamycin IV   Anemia due to chronic illness  - Hgb dropped to 8.3 today which is lower than patient's baseline - while this drop does not necessitate blood transfusion we will D/C anticoagulation (Xarelto) until blood count improves  Hypokalemia  - supplemented  - WNL  Hyperlipidemia  - continue statin   Acute kidney injury  - this admission 2 D ECHO still with reduced EF 45-50% so we will hold of on fluids - BMP this morning shows creatinine  to be WNL at 1.01 - we will start lasix IV 40 mg daily due to elevated BNP - daily weight and strict intake and output  Hyperglycemia  - A1C 6.5  - dietary restrictions discussed with the patient and patient's daughter  Consultants:  Cardiology Corinda Gubler)  Procedures/Studies:   Dg Chest 2 View 09/28/2011   Chronic lung disease with potential overlying mild interstitial edema.  Decrease in bilateral pleural effusions since the prior chest x-ray.   Nm Pulmonary Per & Vent 09/28/2011  No segmental perfusion defects.  The study is negative for pulmonary thromboembolism.   2 D ECHO 09/28/2011  Normal LV size with mild LV hypertrophy.  EF 45-50% with mild global hypokinesis.  Mild to moderate AS. .  Mild pulmonary hypertension. The subcostal images raise concern for a small secundum ASD. Would consider TEE to confirm or refute.  Antibiotics:  Clindamycin IV 09/29/2011 -->  Code Status: Full  Family Communication: Updated daughter [name] at bedside 09/29/2011 and 10/01/2011 Disposition Plan: Home when medically stable    Manson Passey, MD  [facility] Pager [phone]  If 7PM-7AM, please contact night-coverage [email or web address] Password [facility] 10/01/2011, 7:35 AM   LOS: 3 days   HPI/Subjective: No acute evens overnight. Patient reports feeling better overall however does say she feels more short of breath since admission.  Objective: Filed Vitals:   09/30/11 1848 09/30/11 2122 09/30/11 2327 10/01/11 0628  BP: 144/81  134/66 115/74  Pulse: 109   120  Temp: 97.8 F (36.6 C)  97.9 F (36.6 C) 97.5 F (36.4 C)  TempSrc: Oral  Oral Oral  Resp: 20 22  20   Height: 5\' 1"  (1.549 m)     Weight: 182 lb 15.7 oz (83 kg)   182 lb 8.7 oz (82.8 kg)  SpO2: 93%  97% 96%    Intake/Output Summary (Last 24 hours) at 10/01/11 0735 Last data filed at 09/30/11 2330  Gross per 24 hour  Intake   1060 ml  Output    300 ml  Net    760 ml    Exam:   General:  Pt is alert,  follows commands appropriately, appears short of breath  Cardiovascular: tachycardic, irregular rhythm, S1/S2, no murmurs, no rubs, no gallops  Respiratory: Clear to auscultation bilaterally,basilar crackles bilaterally appreciated  Abdomen: Soft, non tender, non distended, bowel sounds present, no guarding  Extremities: (+1) left lower extremity edema and trace right lower extremity pitting edema, pulses DP and PT palpable bilaterally  Neuro: Grossly non focal, sensation intact throughout  Data Reviewed: Basic Metabolic Panel:  Lab 09/29/11 1610 09/29/11 0008 09/28/11 2200  NA 139 -- 136  K 4.2 -- 3.2*  CL 107 -- 101  CO2 26 -- 25  GLUCOSE 91 -- 93  BUN 10 -- 10  CREATININE 1.13* -- 1.14*  CALCIUM 8.7 -- 9.2  MG -- 1.6 --  PHOS -- 3.5 --   Liver Function Tests: No results found for this basename: AST:5,ALT:5,ALKPHOS:5,BILITOT:5,PROT:5,ALBUMIN:5 in the last 168 hours No results found for this basename: LIPASE:5,AMYLASE:5 in the last 168 hours No results found for this basename: AMMONIA:5 in the last 168 hours CBC:  Lab 10/01/11 0718 09/29/11 0818 09/28/11 2200  WBC 6.4 8.4 12.3*  NEUTROABS -- -- --  HGB 8.3* 9.0* 8.9*  HCT 25.9* 27.4* 27.8*  MCV 94.5 94.8 93.9  PLT 221 200 211   Cardiac Enzymes:  Lab 09/29/11 1620 09/29/11 0818 09/29/11 0008  CKTOTAL 32 33 42  CKMB 2.4 2.5 2.4  TROPONINI <0.30 <0.30 <0.30   BNP: No components found with this basename: POCBNP:5 CBG:  Lab 09/30/11 0735 09/29/11 0731  GLUCAP 79 85    Recent Results (from the past 240 hour(s))  MRSA PCR SCREENING     Status: Normal   Collection Time   09/29/11  3:28 AM      Component Value Range Status Comment   MRSA by PCR NEGATIVE  NEGATIVE Final      Studies: No results found.  Scheduled Meds:   . allopurinol  100 mg Oral Daily  . antiseptic oral rinse  15 mL Mouth Rinse q12n4p  . antiseptic oral rinse  15 mL Mouth Rinse BID  . atorvastatin  40 mg Oral Daily  . clindamycin  (CLEOCIN) IV  300 mg Intravenous Q12H  . colesevelam  1,875 mg Oral BID WC  . diltiazem  300 mg Oral Daily  . Fluticasone-Salmeterol  1 puff Inhalation BID  . raloxifene  60 mg Oral Daily  . rivaroxaban  15 mg Oral Q supper  . traZODone  25 mg Oral QHS  . DISCONTD: diltiazem  240 mg Oral Daily   Continuous Infusions:   . diltiazem (CARDIZEM) infusion Stopped (09/30/11 1923)

## 2011-10-02 LAB — GLUCOSE, CAPILLARY

## 2011-10-02 MED ORDER — RIVAROXABAN 15 MG PO TABS
15.0000 mg | ORAL_TABLET | Freq: Every day | ORAL | Status: DC
  Administered 2011-10-02 – 2011-10-03 (×2): 15 mg via ORAL
  Filled 2011-10-02 (×2): qty 1

## 2011-10-02 NOTE — Progress Notes (Signed)
SUBJECTIVE:  Feels ok.  HR slightly decreased from yesterday.  OBJECTIVE:   Vitals:   Filed Vitals:   10/01/11 2020 10/01/11 2334 10/02/11 0506 10/02/11 0834  BP:  117/59 115/67   Pulse:  112 110   Temp:  97.7 F (36.5 C) 98.2 F (36.8 C)   TempSrc:  Oral Oral   Resp:  20 18   Height:      Weight:   80.6 kg (177 lb 11.1 oz)   SpO2: 95% 94% 95% 96%   I&O's:    Intake/Output Summary (Last 24 hours) at 10/02/11 0840 Last data filed at 10/02/11 0500  Gross per 24 hour  Intake    250 ml  Output   1050 ml  Net   -800 ml   TELEMETRY: Reviewed telemetry pt in AFib with RVR:     PHYSICAL EXAM General: Well developed, well nourished, in no acute distress Head:   Normal cephalic and atramatic  Lungs:   Clear bilaterally to auscultation Heart:   Tachycardic, irregularly irregular             Abdomen: obese Msk:  Back normal, Normal strength and tone for age. Extremities:   No edema.   Neuro: Alert and oriented X 3. Psych:  Normal affect, responds appropriately   LABS: Basic Metabolic Panel:  Basename 10/01/11 0718  NA 137  K 4.3  CL 105  CO2 25  GLUCOSE 93  BUN 9  CREATININE 1.01  CALCIUM 8.2*  MG --  PHOS --   Liver Function Tests: No results found for this basename: AST:2,ALT:2,ALKPHOS:2,BILITOT:2,PROT:2,ALBUMIN:2 in the last 72 hours No results found for this basename: LIPASE:2,AMYLASE:2 in the last 72 hours CBC:  Basename 10/01/11 1755 10/01/11 0718  WBC 8.6 6.4  NEUTROABS -- --  HGB 9.2* 8.3*  HCT 28.7* 25.9*  MCV 95.7 94.5  PLT 265 221   Cardiac Enzymes:  Basename 09/29/11 1620  CKTOTAL 32  CKMB 2.4  CKMBINDEX --  TROPONINI <0.30   BNP: No components found with this basename: POCBNP:3 D-Dimer: No results found for this basename: DDIMER:2 in the last 72 hours Hemoglobin A1C: No results found for this basename: HGBA1C in the last 72 hours Fasting Lipid Panel: No results found for this basename: CHOL,HDL,LDLCALC,TRIG,CHOLHDL,LDLDIRECT in  the last 72 hours Thyroid Function Tests: No results found for this basename: TSH,T4TOTAL,FREET3,T3FREE,THYROIDAB in the last 72 hours Anemia Panel: No results found for this basename: VITAMINB12,FOLATE,FERRITIN,TIBC,IRON,RETICCTPCT in the last 72 hours Coag Panel:   Lab Results  Component Value Date   INR 0.98 02/04/2009   INR 2.8* 04/18/2007   INR 1.7* 04/13/2007    RADIOLOGY: Dg Chest 1 View  09/09/2011  *RADIOLOGY REPORT*  Clinical Data: Cough.  Possible aspiration.  CHEST - 1 VIEW  Comparison: 09/06/2011.  09/03/2011.  Findings: Artifact overlies chest.  Right internal jugular central line has its tip in the SVC just above the right atrium.  There is patchy density in both lower lobes left worse than right.  This could be atelectasis or pneumonia.  Findings are similar to the study of 06/29.  Upper lobes remain clear.  IMPRESSION: Patchy atelectasis and/or infiltrate at both lung bases left more than right.  Similar appearance to the study of 06/29.  Original Report Authenticated By: Thomasenia Sales, M.D.   Dg Chest 1 View  09/03/2011  *RADIOLOGY REPORT*  Clinical Data: Shortness of breath, wheezing  CHEST - 1 VIEW  Comparison: 09/02/2011  Findings: Cardiomegaly.  Pulmonary vascular congestion without frank  interstitial edema.  Mild bilateral lower lobe opacities, possibly atelectasis, left lower lobe pneumonia not entirely excluded.  Stable right IJ venous catheter.  Surgical clips in the right neck.  IMPRESSION: Cardiomegaly.  Pulmonary vascular congestion without frank interstitial edema.  Mild bilateral lower lobe opacities, possibly atelectasis, left lower lobe pneumonia not entirely excluded.  Original Report Authenticated By: Charline Bills, M.D.   Dg Chest 2 View  09/28/2011  *RADIOLOGY REPORT*  Clinical Data: Shortness of breath and bilateral lower extremity edema.  CHEST - 2 VIEW  Comparison: 09/12/2011  Findings: There is a component of chronic lung disease.  Mild interstitial edema  may be present.  Small bilateral pleural effusions are present which appears smaller than on the chest x-ray on July 8.  Stable cardiomegaly.  No focal pulmonary consolidation.  IMPRESSION: Chronic lung disease with potential overlying mild interstitial edema.  Decrease in bilateral pleural effusions since the prior chest x-ray.  Original Report Authenticated By: Reola Calkins, M.D.   Dg Chest 2 View  09/12/2011  *RADIOLOGY REPORT*  Clinical Data: 78 year old female with cough.  Renal failure, septic shock.  CHEST - 2 VIEW  Comparison: 09/09/2011 and earlier.  Findings: Upright AP and lateral views of the chest.  Stable right IJ central line.  Stable right neck surgical clips.  Stable lung volumes.  Small to moderate bilateral pleural effusions.  Continued volume loss at the right base and confluent right retrocardiac opacity.  No pneumothorax or edema.  Stable cardiac size and mediastinal contours.  Stable visualized osseous structures.  IMPRESSION: Bilateral pleural effusions and ongoing right lung base collapse or consolidation.  Appearance suspicious for right lower lobe pneumonia.  Original Report Authenticated By: Harley Hallmark, M.D.   Ct Chest Wo Contrast  09/13/2011  *RADIOLOGY REPORT*  Clinical Data: Bilateral pleural effusions and right lung collapse or consolidation on chest radiograph yesterday.  CT CHEST WITHOUT CONTRAST  Technique:  Multidetector CT imaging of the chest was performed following the standard protocol without IV contrast.  Comparison: Chest radiographs obtained yesterday.  Findings: Right jugular catheter tip at the junction of the superior vena cava and right atrium.  Small to moderate-sized right pleural effusion.  Adjacent right lower lobe airspace consolidation with an appearance most compatible with atelectasis.  Minimal left pleural effusion with adjacent left lower lobe atelectasis. Enlarged heart.  Coronary artery calcifications.  Thoracic spine degenerative changes.   Unremarkable upper abdomen.  IMPRESSION:  1.  Bilateral pleural effusions with associated bilateral lower lobe atelectasis, greater on the right. 2.  Cardiomegaly. 3.  Atheromatous coronary artery calcifications.  Original Report Authenticated By: Darrol Angel, M.D.   Nm Pulmonary Per & Vent  09/28/2011  *RADIOLOGY REPORT*  Clinical Data:  Short of breath.  Rule out PE.  NUCLEAR MEDICINE PERFUSION LUNG SCAN  Technique:  Perfusion images were obtained in multiple projections after intravenous injection of radiopharmaceutical.  Radiopharmaceutical:  3.3 mCi technetium 109m MAA mCi Tc-70m MAA.  Comparison:  None.  Findings: Cardiomegaly is noted.  There is slight patchy perfusion within the left lung.  No definite segmental perfusion defects are identified.  IMPRESSION: No segmental perfusion defects.  The study is negative for pulmonary thromboembolism.  Original Report Authenticated By: Donavan Burnet, M.D.   US Renal Port  09/05/2011  *RADIOLOGY REPORT*  Clinical Data:  Acute renal insufficiency  RENAL/URINARY TRACT ULTRASOUND COMPLETE  Comparison:  08/31/2011  Findings:  Right Kidney:  Measures 10.4 cm. Normal echogenicity.  There is mild scarring and  volume loss involving the inferior pole.  No evidence of mass or hydronephrosis.  Left Kidney:  Measures 11.1 cm.  Normal in size and parenchymal echogenicity.  No evidence of mass or hydronephrosis.  Bladder:  Appears normal for degree of bladder distention.  IMPRESSION:  1.  No evidence for obstructive uropathy. 2.  Right renal scarring  Original Report Authenticated By: Rosealee Albee, M.D.   Dg Chest Port 1 View  09/06/2011  *RADIOLOGY REPORT*  Clinical Data: Congestive heart failure  PORTABLE CHEST - 1 VIEW  Comparison: Chest radiograph 09/03/2011  Findings: Right central venous line is in place.  Stable cardiac silhouette.  Central venous pulmonary congestion is increased compared to prior. Interstitial edema is present.  Lung bases are excluded.  No  pneumothorax.  IMPRESSION: Increased central venous congestion and interstitial edema.  Original Report Authenticated By: Genevive Bi, M.D.   Dg Chest Port 1 View  09/02/2011  *RADIOLOGY REPORT*  Clinical Data: Central line placement.  PORTABLE CHEST - 1 VIEW  Comparison: 08/31/2011  Findings: Right central line tip at the cavoatrial junction.  No pneumothorax.  Cardiomegaly.  Left base atelectasis or infiltrate. Right lung is clear.  Heart is normal size.  No overt edema.  IMPRESSION: Right central line tip at the cavoatrial junction.  No pneumothorax.  Left lower lobe atelectasis or infiltrate.  Original Report Authenticated By: Cyndie Chime, M.D.      ASSESSMENT: AFib with RVR, anemia  PLAN:  HR has increased despite Cardizem.  Digoxin started yesterday.  Slow load. If HR does not improve, would add low dose beta blocker.  She does have COPD and uses advair, so would have to watch for wheezing.  Anemia. Repeat CBC yesterday better.  Hemoccult negative. Restart Xarelto for stroke prevention.    Mild LV dysfunction.  WIll have to watch for pulmonary edema with fast heart rate in the setting of lower EF.  Corky Crafts., MD  10/02/2011  8:40 AM

## 2011-10-02 NOTE — Progress Notes (Signed)
[facility] PROGRESS NOTE  GRIER CZERWINSKI ZOX:[id number] DOB: [birth year] DOA: 09/28/2011 PCP: [name], MD  Brief narrative:  Patient is 78 yo female who presented with main concern of progressively worsening shortness of breath that started several days prior to admission and have been associated with poor oral intake, generalized fatigue, lower extremity swelling. She was admitted 09/28/2011 for evaluation and management of new onset atrial fibrillation.   Assessment and Plan:   Principal Problem:  *Shortness of breath, secondary to atrial fibrillation and acute on chronic systolic and diastolic dysfunction  - related to atrial fibrillation in combination to acute on chronic diastolic and systolic CHF   - 2 D ECHO EF 40-45% and elevated BNP 1,563 - continue lasix 40 mg daily IV - daily weigth and strict intake and output  - CE's x 3 sets are within normal limits, and TSH is also within normal limits   Active Problems:  Atrial fibrillation  - pt has no history of atrial fib  - now on cardizem 300 mg PO daily and digoxin (started 10/01/2011) - HR still elevated at 110  Leukocytosis  - secondary to left lower extremity cellulitis  - improving  - continue clindamycin IV   Anemia due to chronic illness  - Hgb 9.2 today  - Xarelto started again today 10/02/2011   Hypokalemia  - supplemented  - WNL   Hyperlipidemia  - continue statin   Acute kidney injury  - 2 D ECHO on this admission EF 45-50%  - on 10/01/2011 Creatinine within normal limits - continue lasix 40 mg IV daily - in past 24 hours ~ 800 cc negative fluid balance - daily weight and strict intake and output   Hyperglycemia  - dietary restrictions discussed with the patient and patient's daughter   Consultants:  Cardiology Corinda Gubler)  Procedures/Studies:  Dg Chest 2 View 09/28/2011  Chronic lung disease with potential overlying mild interstitial edema.  Decrease in bilateral pleural effusions since the  prior chest x-ray.  Nm Pulmonary Per & Vent 09/28/2011  No segmental perfusion defects.  The study is negative for pulmonary thromboembolism.  2 D ECHO 09/28/2011  Normal LV size with mild LV hypertrophy.  EF 45-50% with mild global hypokinesis.  Mild to moderate AS. .  Mild pulmonary hypertension. The subcostal images raise concern for a small secundum ASD. Would consider TEE to confirm or refute.  Antibiotics:  Clindamycin IV 09/29/2011 -->  Code Status: Full  Family Communication: Updated daughter [name] at bedside 09/29/2011 and 10/01/2011  Disposition Plan: Home when medically stable   Manson Passey, MD  [facility] Pager [phone]  If 7PM-7AM, please contact night-coverage [email or web address] Password [email or web address] 10/02/2011, 9:08 AM   LOS: 4 days   HPI/Subjective: No acute events overnight. Patient reports she still feels short of breath but improved since yesterday  Objective: Filed Vitals:   10/01/11 2020 10/01/11 2334 10/02/11 0506 10/02/11 0834  BP:  117/59 115/67   Pulse:  112 110   Temp:  97.7 F (36.5 C) 98.2 F (36.8 C)   TempSrc:  Oral Oral   Resp:  20 18   Height:      Weight:   177 lb 11.1 oz (80.6 kg)   SpO2: 95% 94% 95% 96%    Intake/Output Summary (Last 24 hours) at 10/02/11 0908 Last data filed at 10/02/11 0500  Gross per 24 hour  Intake    250 ml  Output   1050 ml  Net   -800 ml  Exam:   General:  Pt is alert, follows commands appropriately, not in acute distress  Cardiovascular: irregular rhythm, tachycardic, S1/S2, no murmurs, no rubs, no gallops  Respiratory: Clear to auscultation bilaterally, no wheezing, no crackles, no rhonchi  Abdomen: Soft, non tender, non distended, bowel sounds present, no guarding  Extremities: LLE (+1) edema, pulses DP and PT palpable bilaterally  Neuro: Grossly nonfocal  Data Reviewed: Basic Metabolic Panel:  Lab 10/01/11 1610 09/29/11 0818 09/28/11 2200  NA 137 139 136  K 4.3 4.2 3.2*  CL  105 107 101  CO2 25 26 25   GLUCOSE 93 91 93  BUN 9 10 10   CREATININE 1.01 1.13* 1.14*  CALCIUM 8.2* 8.7 9.2   CBC:  Lab 10/01/11 1755 10/01/11 0718 09/29/11 0818 09/28/11 2200  WBC 8.6 6.4 8.4 12.3*  NEUTROABS -- -- -- --  HGB 9.2* 8.3* 9.0* 8.9*  HCT 28.7* 25.9* 27.4* 27.8*  MCV 95.7 94.5 94.8 93.9  PLT 265 221 200 211   Cardiac Enzymes:  Lab 09/29/11 1620 09/29/11 0818 09/29/11 0008  CKTOTAL 32 33 42  CKMB 2.4 2.5 2.4  CKMBINDEX -- -- --  TROPONINI <0.30 <0.30 <0.30   CBG:  Lab 10/02/11 0736 10/01/11 0818 09/30/11 0735 09/29/11 0731  GLUCAP 84 92 79 85    Recent Results (from the past 240 hour(s))  MRSA PCR SCREENING     Status: Normal   Collection Time   09/29/11  3:28 AM      Component Value Range Status Comment   MRSA by PCR NEGATIVE  NEGATIVE Final      Studies: Dg Chest Port 1 View 10/01/2011  * IMPRESSION:  Probable small left pleural effusion with left basilar atelectasis or infiltrate.  Slight worsening interstitial prominence. Superimposed early interstitial edema on chronic interstitial lung disease cannot be excluded.    Scheduled Meds:   . allopurinol  100 mg Oral Daily  . atorvastatin  40 mg Oral Daily  . clindamycin (CLEOCIN) IV  300 mg Intravenous Q12H  . colesevelam  1,875 mg Oral BID WC  . digoxin  0.125 mg Oral Daily  . diltiazem  300 mg Oral Daily  . Fluticasone-Salmeterol  1 puff Inhalation BID  . furosemide  40 mg Intravenous Daily  . raloxifene  60 mg Oral Daily  . rivaroxaban  15 mg Oral Daily  . traZODone  25 mg Oral QHS  . vitamin A & D       Continuous Infusions:   . diltiazem (CARDIZEM) infusion Stopped (09/30/11 1923)

## 2011-10-03 ENCOUNTER — Inpatient Hospital Stay (HOSPITAL_COMMUNITY): Payer: Medicare Other

## 2011-10-03 DIAGNOSIS — R0602 Shortness of breath: Secondary | ICD-10-CM

## 2011-10-03 DIAGNOSIS — E860 Dehydration: Secondary | ICD-10-CM

## 2011-10-03 LAB — CBC
HCT: 29.3 % — ABNORMAL LOW (ref 36.0–46.0)
Hemoglobin: 9.3 g/dL — ABNORMAL LOW (ref 12.0–15.0)
RBC: 3.05 MIL/uL — ABNORMAL LOW (ref 3.87–5.11)
WBC: 5.9 10*3/uL (ref 4.0–10.5)

## 2011-10-03 LAB — BASIC METABOLIC PANEL
BUN: 10 mg/dL (ref 6–23)
CO2: 27 mEq/L (ref 19–32)
Chloride: 103 mEq/L (ref 96–112)
GFR calc non Af Amer: 40 mL/min — ABNORMAL LOW (ref >90)
Glucose, Bld: 103 mg/dL — ABNORMAL HIGH (ref 70–99)
Potassium: 4 mEq/L (ref 3.5–5.1)
Sodium: 140 mEq/L (ref 135–145)

## 2011-10-03 MED ORDER — FUROSEMIDE 40 MG PO TABS
40.0000 mg | ORAL_TABLET | Freq: Every day | ORAL | Status: DC
  Administered 2011-10-03 – 2011-10-06 (×3): 40 mg via ORAL
  Filled 2011-10-03 (×4): qty 1

## 2011-10-03 MED ORDER — SODIUM CHLORIDE 0.9 % IJ SOLN
10.0000 mL | INTRAMUSCULAR | Status: DC | PRN
  Administered 2011-10-04: 10 mL

## 2011-10-03 MED ORDER — RIVAROXABAN 20 MG PO TABS
20.0000 mg | ORAL_TABLET | Freq: Every day | ORAL | Status: DC
  Administered 2011-10-04 – 2011-10-06 (×3): 20 mg via ORAL
  Filled 2011-10-03 (×3): qty 1

## 2011-10-03 MED ORDER — DM-GUAIFENESIN ER 30-600 MG PO TB12
1.0000 | ORAL_TABLET | Freq: Two times a day (BID) | ORAL | Status: DC
  Administered 2011-10-03 – 2011-10-06 (×6): 1 via ORAL
  Filled 2011-10-03 (×8): qty 1

## 2011-10-03 NOTE — Progress Notes (Signed)
Patient ID: [name] [name], female   DOB: [birth year], 78 y.o.   MRN: [id number]  [facility] PROGRESS NOTE  Salima Rumer Kaffenberger OZH:[id number] DOB: 16-May-1923 DOA: 09/28/2011 PCP: [name], MD  Brief narrative:  Patient is 78 yo female who presented with main concern of progressively worsening shortness of breath that started several days prior to admission and have been associated with poor oral intake, generalized fatigue, lower extremity swelling. She was admitted 09/28/2011 for evaluation and management of new onset atrial fibrillation. Plan is to proceed with TEE cardioversion 09/24/2011 as per cardiology recommendations.   Assessment and Plan:  Principal Problem:  *Shortness of breath, secondary to atrial fibrillation and acute on chronic systolic and diastolic dysfunction  - related to atrial fibrillation in combination to acute on chronic diastolic and systolic CHF  - 2 D ECHO EF 40-45% and elevated BNP 1,563  - daily weigths and strict intake and output  - CE's x 3 sets are within normal limits, and TSH is also within normal limits   Active Problems:  Atrial fibrillation  - now on cardizem 300 mg PO daily and digoxin (started 10/01/2011)  - HR still rather difficult to control - plan is to proceed with TEE 07/30 per cardio recommendations - Amiodarone will be started today 10/03/2011  Leukocytosis  - secondary to left lower extremity cellulitis  - improving and WBC within normal limits this AM - continue clindamycin IV   Anemia due to chronic illness  - Hgb 9.3 today  - Xarelto started 10/02/2011  - CBC in AM  Hypokalemia  - supplemented  - WNL   Hyperlipidemia  - continue statin   Acute kidney injury  - 2 D ECHO on this admission EF 45-50%  - on 10/01/2011 Creatinine within normal limits  - in past 24 hours ~ 800 cc negative fluid balance  - daily weight and strict intake and output  - BMP in AM  Hyperglycemia  - dietary restrictions discussed with the  patient and patient's daughter   Consultants:  Cardiology Corinda Gubler)  Procedures/Studies:  Dg Chest 2 View 09/28/2011  Chronic lung disease with potential overlying mild interstitial edema.  Decrease in bilateral pleural effusions since the prior chest x-ray.   Nm Pulmonary Per & Vent 09/28/2011  No segmental perfusion defects.  The study is negative for pulmonary thromboembolism.   2 D ECHO 09/28/2011  Normal LV size with mild LV hypertrophy.  EF 45-50% with mild global hypokinesis.  Mild to moderate AS. .  Mild pulmonary hypertension. The subcostal images raise concern for a small secundum ASD. Would consider TEE to confirm or refute.  Antibiotics:  Clindamycin IV 09/29/2011 -->  Code Status: Full  Family Communication: Updated daughter [name] at bedside 09/29/2011 and 10/01/2011  Disposition Plan: Home when medically stable   HPI/Subjective: No events overnight.   Objective: Filed Vitals:   10/02/11 1434 10/02/11 2052 10/03/11 0545 10/03/11 0920  BP: 109/76 101/58 110/66   Pulse: 90 104 102   Temp: 98.5 F (36.9 C) 98.6 F (37 C) 98.4 F (36.9 C)   TempSrc: Oral Oral Oral   Resp: 20 20 18    Height:      Weight:   177 lb 11.1 oz (80.6 kg)   SpO2: 96% 96% 96% 97%    Intake/Output Summary (Last 24 hours) at 10/03/11 1124 Last data filed at 10/03/11 0500  Gross per 24 hour  Intake    290 ml  Output    540 ml  Net   -  250 ml    Exam:   General:  Pt is alert, follows commands appropriately, not in acute distress  Cardiovascular: Irregular rate and rhythm, S1/S2, no murmurs, no rubs, no gallops  Respiratory: Clear to auscultation bilaterally, no wheezing, no crackles, no rhonchi  Abdomen: Soft, non tender, non distended, bowel sounds present, no guarding  Extremities: No edema, pulses DP and PT palpable bilaterally  Neuro: Grossly nonfocal  Data Reviewed: Basic Metabolic Panel:  Lab 10/03/11 5621 10/01/11 0718 09/29/11 0818 09/29/11 0008 09/28/11 2200    NA 140 137 139 -- 136  K 4.0 4.3 4.2 -- 3.2*  CL 103 105 107 -- 101  CO2 27 25 26  -- 25  GLUCOSE 103* 93 91 -- 93  BUN 10 9 10  -- 10  CREATININE 1.19* 1.01 1.13* -- 1.14*  CALCIUM 8.5 8.2* 8.7 -- 9.2  MG -- -- -- 1.6 --  PHOS -- -- -- 3.5 --   CBC:  Lab 10/03/11 0815 10/01/11 1755 10/01/11 0718 09/29/11 0818 09/28/11 2200  WBC 5.9 8.6 6.4 8.4 12.3*  NEUTROABS -- -- -- -- --  HGB 9.3* 9.2* 8.3* 9.0* 8.9*  HCT 29.3* 28.7* 25.9* 27.4* 27.8*  MCV 96.1 95.7 94.5 94.8 93.9  PLT 282 265 221 200 211   Cardiac Enzymes:  Lab 09/29/11 1620 09/29/11 0818 09/29/11 0008  CKTOTAL 32 33 42  CKMB 2.4 2.5 2.4  CKMBINDEX -- -- --  TROPONINI <0.30 <0.30 <0.30   BNP: No components found with this basename: POCBNP:5 CBG:  Lab 10/03/11 0739 10/02/11 0736 10/01/11 0818 09/30/11 0735 09/29/11 0731  GLUCAP 86 84 92 79 85    Recent Results (from the past 240 hour(s))  MRSA PCR SCREENING     Status: Normal   Collection Time   09/29/11  3:28 AM      Component Value Range Status Comment   MRSA by PCR NEGATIVE  NEGATIVE Final      Scheduled Meds:   . allopurinol  100 mg Oral Daily  . antiseptic oral rinse  15 mL Mouth Rinse q12n4p  . antiseptic oral rinse  15 mL Mouth Rinse BID  . atorvastatin  40 mg Oral Daily  . clindamycin (CLEOCIN) IV  300 mg Intravenous Q12H  . colesevelam  1,875 mg Oral BID WC  . dextromethorphan-guaiFENesin  1 tablet Oral BID  . digoxin  0.125 mg Oral Daily  . diltiazem  300 mg Oral Daily  . Fluticasone-Salmeterol  1 puff Inhalation BID  . furosemide  40 mg Intravenous Daily  . raloxifene  60 mg Oral Daily  . rivaroxaban  20 mg Oral Daily  . traZODone  25 mg Oral QHS  . DISCONTD: rivaroxaban  15 mg Oral Daily   Continuous Infusions:   . diltiazem (CARDIZEM) infusion Stopped (09/30/11 1923)     Manson Passey, MD  [facility] Pager [phone]  If 7PM-7AM, please contact night-coverage [email or web address] Password [email or web address] 10/03/2011, 11:24 AM    LOS: 5 days

## 2011-10-03 NOTE — Care Management Note (Unsigned)
    Page 1 of 2   10/04/2011     2:42:56 PM   CARE MANAGEMENT NOTE 10/04/2011  Patient:  Moultrie,Shanequa F   Account Number:  [id number]  Date Initiated:  09/29/2011  Documentation initiated by:  DAVIS,RHONDA  Subjective/Objective Assessment:   pt with new onset of a. fib. iv cardizem drip     Action/Plan:   from home   Anticipated DC Date:  10/07/2011   Anticipated DC Plan:  HOME W HOME HEALTH SERVICES  In-house referral  NA         [facility] Choice  Resumption Of Svcs/PTA Provider   Choice offered to / List presented to:     DME arranged  NA      DME agency  NA     HH arranged  NA      HH agency  NA   Status of service:  In process, will continue to follow Medicare Important Message given?  NA - LOS <3 / Initial given by admissions (If response is "NO", the following Medicare IM given date fields will be blank) Date Medicare IM given:   Date Additional Medicare IM given:    Discharge Disposition:    Per UR Regulation:  Reviewed for med. necessity/level of care/duration of stay  If discussed at Long Length of Stay Meetings, dates discussed:   10/04/2011    Comments:  10/04/11 Lleyton Byers RN,BSN NCM [phone] ACTIVE W/[facility]-HHRN/PT/OT/NURSE'S AIDE,[facility] INFOMRED & FOLLOWING.IF MD AGREE PLEASE PUT IN [facility] ORDERS FOR RESUMPTION OF SERVICES.  10/03/11 Peaches Vanoverbeke RN,BSN NCM [phone] ACTIVE W/[facility] CONTACTED [facility] TO FOLLOW FOR RESUMPTION OF HHC.  [id number] Earlene Plater, RN, BSN, CCM: CHART REVIEWED AND UPDATED. NO DISCHARGE NEEDS PRESENT AT THIS TIME. CASE MANAGEMENT [phone]

## 2011-10-03 NOTE — Progress Notes (Signed)
@   Subjective:  Denies CP; dyspnea improving   Objective:  Filed Vitals:   10/02/11 1051 10/02/11 1434 10/02/11 2052 10/03/11 0545  BP: 106/69 109/76 101/58 110/66  Pulse:  90 104 102  Temp:  98.5 F (36.9 C) 98.6 F (37 C) 98.4 F (36.9 C)  TempSrc:  Oral Oral Oral  Resp:  20 20 18   Height:      Weight:    80.6 kg (177 lb 11.1 oz)  SpO2:  96% 96% 96%    Intake/Output from previous day:  Intake/Output Summary (Last 24 hours) at 10/03/11 0655 Last data filed at 10/03/11 0500  Gross per 24 hour  Intake    340 ml  Output    540 ml  Net   -200 ml    Physical Exam: Physical exam: Well-developed well-nourished in no acute distress.  Skin is warm and dry.  HEENT is normal.  Neck is supple.  Chest is clear to auscultation with normal expansion.  Cardiovascular exam is irregular Abdominal exam nontender or distended. No masses palpated. Extremities show no edema. neuro grossly intact    Lab Results: Basic Metabolic Panel:  [facility] 10/01/11 0718  NA 137  K 4.3  CL 105  CO2 25  GLUCOSE 93  BUN 9  CREATININE 1.01  CALCIUM 8.2*  MG --  PHOS --   CBC:  Basename 10/01/11 1755 10/01/11 0718  WBC 8.6 6.4  NEUTROABS -- --  HGB 9.2* 8.3*  HCT 28.7* 25.9*  MCV 95.7 94.5  PLT 265 221     Assessment/Plan:  #1-atrial fibrillation-the patient remains in atrial fibrillation and her heart rate is elevated. Will continue Cardizem and digoxin. Her blood pressure is borderline and we therefore cannot advance her rate controlling medications. Given difficulties with rate control and the fact that the patient is symptomatic presenting with CHF, feel reestablishing sinus rhythm would be beneficial. Plan to continue xeralto (GFR > 50 now; change to 20 mg po daily). Proceed with TEE guided cardioversion on July 30. Add amiodarone at that time once she is in sinus rhythm and left atrial appendage thrombus excluded. It should be noted that she had atrial fibrillation during her  last hospitalization as well. However she was in sinus rhythm when she presented in June. Continue gentle diuresis. Follow renal function. #2-recent colitis-continue to follow hemoglobin. No evidence of active bleeding. #3-question atrial septal defect-this can be evaluated at time of TEE. #4-hyperlipidemia-continue statin.  Olga Millers 10/03/2011, 6:55 AM

## 2011-10-03 NOTE — Progress Notes (Signed)
Physical Therapy Treatment Patient Details Name: ALETHA ALLEBACH MRN: [id number] DOB: Jul 19, 1923 Today's Date: 10/03/2011 Time: 9629-5284 PT Time Calculation (min): 13 min  PT Assessment / Plan / Recommendation Comments on Treatment Session  Pt/daughter state she will be going over to [facility] tomorrow for cardioversion procedure then back to [facility].  Tolerated ambulation well with RW, however noted that HR was 109-135 after 80' amb and then 102-116 following rest once back in room.  O2 sats at 90-91% on RA following 80' amb and 93% once in recliner in room.     Follow Up Recommendations  Home health PT    Barriers to Discharge        Equipment Recommendations  None recommended by PT    Recommendations for Other Services    Frequency Min 3X/week   Plan Discharge plan remains appropriate    Precautions / Restrictions Precautions Precautions: Fall Precaution Comments: monitor O2 sats and HR Restrictions Weight Bearing Restrictions: No   Pertinent Vitals/Pain No pain    Mobility  Bed Mobility Bed Mobility: Not assessed Transfers Transfers: Sit to Stand;Stand to Sit Sit to Stand: 5: Supervision;With armrests;From chair/3-in-1 Stand to Sit: 5: Supervision;With armrests;To chair/3-in-1 Details for Transfer Assistance: Supervision and cues for safety and hand placement.  Ambulation/Gait Ambulation/Gait Assistance: 4: Min guard Ambulation Distance (Feet): 80 Feet (then another 80') Assistive device: Rolling walker Ambulation/Gait Assistance Details: Min/guard for safety with no noted LOB.  Cues for safety with gait speed.  Gait Pattern: Step-through pattern Gait velocity: decreased Stairs: No    Exercises     PT Diagnosis:    PT Problem List:   PT Treatment Interventions:     PT Goals Acute Rehab PT Goals PT Goal Formulation: With patient/family Time For Goal Achievement: 10/07/11 Potential to Achieve Goals: Good Pt will go Sit to Stand: with modified independence PT Goal: Sit  to Stand - Progress: Progressing toward goal Pt will go Stand to Sit: with modified independence PT Goal: Stand to Sit - Progress: Progressing toward goal Pt will Ambulate: 51 - 150 feet;with supervision;with least restrictive assistive device PT Goal: Ambulate - Progress: Progressing toward goal  Visit Information  Last PT Received On: 10/03/11 Assistance Needed: +1    Subjective Data  Subjective: I don't think I'm supposed to walk Patient Stated Goal: Back home.    Cognition  Overall Cognitive Status: Appears within functional limits for tasks assessed/performed Arousal/Alertness: Awake/alert Orientation Level: Appears intact for tasks assessed Behavior During Session: [facility] for tasks performed    Balance     End of Session PT - End of Session Activity Tolerance: Patient tolerated treatment well Patient left: in chair;with call bell/phone within reach;with family/visitor present   GP     Page, Meribeth Mattes 10/03/2011, 11:44 AM

## 2011-10-03 NOTE — Progress Notes (Signed)
Peripherally Inserted Central Catheter/Midline Placement  The IV Nurse has discussed with the patient and/or persons authorized to consent for the patient, the purpose of this procedure and the potential benefits and risks involved with this procedure.  The benefits include less needle sticks, lab draws from the catheter and patient may be discharged home with the catheter.  Risks include, but not limited to, infection, bleeding, blood clot (thrombus formation), and puncture of an artery; nerve damage and irregular heat beat.  Alternatives to this procedure were also discussed.  PICC/Midline Placement Documentation        Mellissa Kohut 10/03/2011, 2:25 PM

## 2011-10-04 ENCOUNTER — Encounter (HOSPITAL_COMMUNITY): Payer: Self-pay | Admitting: Anesthesiology

## 2011-10-04 ENCOUNTER — Inpatient Hospital Stay (HOSPITAL_COMMUNITY): Payer: Medicare Other | Admitting: Anesthesiology

## 2011-10-04 ENCOUNTER — Encounter (HOSPITAL_COMMUNITY): Payer: Self-pay | Admitting: Gastroenterology

## 2011-10-04 ENCOUNTER — Encounter (HOSPITAL_COMMUNITY)
Admission: EM | Disposition: A | Discharge status: Home Health | Payer: Self-pay | Source: Home / Self Care | Attending: Internal Medicine

## 2011-10-04 DIAGNOSIS — I959 Hypotension, unspecified: Secondary | ICD-10-CM

## 2011-10-04 DIAGNOSIS — I4891 Unspecified atrial fibrillation: Secondary | ICD-10-CM

## 2011-10-04 HISTORY — PX: CARDIOVERSION: SHX1299

## 2011-10-04 HISTORY — PX: TEE WITHOUT CARDIOVERSION: SHX5443

## 2011-10-04 LAB — BASIC METABOLIC PANEL
CO2: 29 mEq/L (ref 19–32)
Chloride: 103 mEq/L (ref 96–112)
GFR calc non Af Amer: 47 mL/min — ABNORMAL LOW (ref >90)
Glucose, Bld: 98 mg/dL (ref 70–99)
Potassium: 3.6 mEq/L (ref 3.5–5.1)
Sodium: 140 mEq/L (ref 135–145)

## 2011-10-04 LAB — CBC
HCT: 25.9 % — ABNORMAL LOW (ref 36.0–46.0)
Hemoglobin: 8.3 g/dL — ABNORMAL LOW (ref 12.0–15.0)
MCH: 30.4 pg (ref 26.0–34.0)
RBC: 2.73 MIL/uL — ABNORMAL LOW (ref 3.87–5.11)

## 2011-10-04 LAB — MAGNESIUM: Magnesium: 1.5 mg/dL (ref 1.5–2.5)

## 2011-10-04 LAB — GLUCOSE, CAPILLARY: Glucose-Capillary: 83 mg/dL (ref 70–99)

## 2011-10-04 SURGERY — ECHOCARDIOGRAM, TRANSESOPHAGEAL
Anesthesia: Moderate Sedation

## 2011-10-04 MED ORDER — DOXYCYCLINE HYCLATE 100 MG PO CAPS
100.0000 mg | ORAL_CAPSULE | Freq: Two times a day (BID) | ORAL | Status: DC
  Administered 2011-10-04 – 2011-10-06 (×4): 100 mg via ORAL
  Filled 2011-10-04 (×5): qty 1

## 2011-10-04 MED ORDER — SODIUM CHLORIDE 0.9 % IV SOLN
250.0000 mL | INTRAVENOUS | Status: DC | PRN
  Administered 2011-10-04: 250 mL via INTRAVENOUS

## 2011-10-04 MED ORDER — FENTANYL CITRATE 0.05 MG/ML IJ SOLN
INTRAMUSCULAR | Status: DC | PRN
  Administered 2011-10-04: 25 ug via INTRAVENOUS

## 2011-10-04 MED ORDER — AMIODARONE HCL 200 MG PO TABS
400.0000 mg | ORAL_TABLET | Freq: Two times a day (BID) | ORAL | Status: DC
  Administered 2011-10-04 – 2011-10-06 (×4): 400 mg via ORAL
  Filled 2011-10-04 (×6): qty 2

## 2011-10-04 MED ORDER — PROPOFOL 10 MG/ML IV EMUL
INTRAVENOUS | Status: DC | PRN
  Administered 2011-10-04: 40 mg via INTRAVENOUS

## 2011-10-04 MED ORDER — MIDAZOLAM HCL 10 MG/2ML IJ SOLN
INTRAMUSCULAR | Status: AC
  Filled 2011-10-04: qty 2

## 2011-10-04 MED ORDER — BUTAMBEN-TETRACAINE-BENZOCAINE 2-2-14 % EX AERO
INHALATION_SPRAY | CUTANEOUS | Status: DC | PRN
  Administered 2011-10-04: 2 via TOPICAL

## 2011-10-04 MED ORDER — SODIUM CHLORIDE 0.9 % IJ SOLN: 3.0000 mL | Freq: Two times a day (BID) | INTRAMUSCULAR | Status: DC

## 2011-10-04 MED ORDER — DOXYCYCLINE HYCLATE 100 MG PO TABS
100.0000 mg | ORAL_TABLET | Freq: Two times a day (BID) | ORAL | Status: DC
  Filled 2011-10-04 (×3): qty 1

## 2011-10-04 MED ORDER — FLUMAZENIL 0.5 MG/5ML IV SOLN
INTRAVENOUS | Status: AC
  Filled 2011-10-04: qty 5

## 2011-10-04 MED ORDER — SODIUM CHLORIDE 0.9 % IJ SOLN: 3.0000 mL | INTRAMUSCULAR | Status: DC | PRN

## 2011-10-04 MED ORDER — SODIUM CHLORIDE 0.45 % IV SOLN: INTRAVENOUS | Status: DC

## 2011-10-04 MED ORDER — FENTANYL CITRATE 0.05 MG/ML IJ SOLN
INTRAMUSCULAR | Status: AC
  Filled 2011-10-04: qty 2

## 2011-10-04 MED ORDER — MIDAZOLAM HCL 10 MG/2ML IJ SOLN
INTRAMUSCULAR | Status: DC | PRN
  Administered 2011-10-04: 2 mg via INTRAVENOUS

## 2011-10-04 NOTE — Procedures (Addendum)
Electrical Cardioversion Procedure Note JAMEKA IVIE [id number] [birth year]  Procedure: Electrical Cardioversion Indications:  Atrial Fibrillation. She has had Xeralto for 3 doses prior to presentation.  TEE showed no LAA appendage thrombus.   Procedure Details Consent: Risks of procedure as well as the alternatives and risks of each were explained to the (patient/caregiver).  Consent for procedure obtained. Time Out: Verified patient identification, verified procedure, site/side was marked, verified correct patient position, special equipment/implants available, medications/allergies/relevent history reviewed, required imaging and test results available.  Performed  Patient placed on cardiac monitor, pulse oximetry, supplemental oxygen as necessary.  Sedation given: Propofol 40 mg IV Pacer pads placed anterior and posterior chest.  Cardioverted 1 time(s).  Cardioverted at 200J.  Evaluation Findings: Post procedure EKG shows: NSR Complications: None Patient did tolerate procedure well. Amiodarone 400 mg bid started.   Marca Ancona 10/04/2011, 2:59 PM

## 2011-10-04 NOTE — Progress Notes (Signed)
  Echocardiogram Echocardiogram Transesophageal has been performed.  Enijah Furr 10/04/2011, 3:04 PM

## 2011-10-04 NOTE — Progress Notes (Signed)
[facility] PROGRESS NOTE  [name] [name] AVW:[id number] DOB: 02-Jun-1923 DOA: 09/28/2011 PCP: [name] Ok, MD  Brief narrative:  Patient is 78 yo very pleasant female who presented 09/28/2011 with main concern of progressively worsening shortness of breath started 3 days prior to this admission. Patient was found to be in Atrial Fibrillation (of note - patient was in A fib even on previous admissions but on her last admission she was in sinus rhythm). Per cardiology, due to her CHF it was felt her heart rate should be better controlled and while definitely better since the admission it was still inadequately controlled despite being on Cardizem 300 mg daily and digoxin. Plan is for TEE today and then amiodarone PO 400 mg BID once in sinus rhythm. Additionally, patient is also on xarelto.  Assessment and Plan:   Principal Problem:  *Shortness of breath, secondary to atrial fibrillation and acute on chronic systolic and diastolic dysfunction  - related to atrial fibrillation in combination to acute on chronic diastolic and systolic CHF  - plan for TEE today and once in sinus rhythm to start amiodarone 400 mg PO BID - 2 D ECHO on this admission EF 40-45% and elevated BNP 1,563  - daily weigths and strict intake and output  - lasix 40 mg IV daily is now changed to PO regimen 40 mg PO daily - CE's x 3 sets are within normal limits, and TSH is also within normal limits  - appreciate cardiology following  Active Problems:  Atrial fibrillation  - continue cardizem 300 mg PO daily and digoxin (started 10/01/2011)  - HR in past 24 hours 94-104 - plan is to proceed with TEE 07/30 per cardio recommendations  - Amiodarone will be started once patient is sinus rhythm  Leukocytosis  - secondary to left lower extremity cellulitis; almost completely resolved - WBC count within normal limits -  Will D/C clindamycin and start doxycycline po for additional 5 days  Anemia due to chronic illness  -  Hgb 8.3 today - on xarelto; please watch for bleed - follow up  CBC in AM   Hypokalemia  - supplemented  - WNL   Hyperlipidemia  - continue statin   Acute kidney injury  - 2 D ECHO on this admission EF 45-50%  - creatinine today within normal limits  Hyperglycemia  - dietary restrictions discussed with the patient and patient's daughter [name] [name]  Medical Consultants:   Cardiology Corinda Gubler)  Other consultants:  Physical therapy  Procedures/Studies:   TEE (10/04/2011)  2 D ECHO 09/28/2011 - Normal LV size with mild LV hypertrophy  Antibiotics:  Clindamycin IV 09/29/2011 -->10/04/2011 Doxycycline 100 mg Q 12 hours for next 5 days  Code Status: Full  Family Communication: Updated daughter [name] [name] at bedside on daily basis; both patient and daughter are aware of plan of care Disposition Plan: Home when medically stable    Manson Passey, MD  [facility] Pager [phone]  If 7PM-7AM, please contact night-coverage [email or web address] Password [email or web address] 10/04/2011, 10:36 AM   LOS: 6 days   HPI/Subjective: No acute overnight events. Patient reports feeling bette today, says she is prepared for TEE planned for today.  Objective: Filed Vitals:   10/03/11 1347 10/03/11 2045 10/04/11 0544 10/04/11 0917  BP: 95/57 130/81 120/67   Pulse: 94 96 104   Temp: 98.1 F (36.7 C) 98.4 F (36.9 C) 98.1 F (36.7 C)   TempSrc: Oral Oral Oral   Resp: 20 18 18    Height:  Weight:      SpO2: 93%  92% 93%    Intake/Output Summary (Last 24 hours) at 10/04/11 1036 Last data filed at 10/04/11 0545  Gross per 24 hour  Intake    240 ml  Output   1700 ml  Net  -1460 ml    Exam:   General:  Pt is alert, follows commands appropriately, not in acute distress  Cardiovascular: tachycardic, irregular rhythm  Respiratory: Clear to auscultation bilaterally, no wheezing, no crackles, no rhonchi  Abdomen: Soft, non tender, non distended, bowel sounds present, no  guarding  Extremities: Left lower extremity edema edema, pulses DP and PT palpable bilaterally  Neuro: Grossly nonfocal  Data Reviewed: Basic Metabolic Panel:  Lab 10/04/11 9562 10/03/11 0815 10/01/11 0718 09/29/11 0818 09/28/11 2200  NA 140 140 137 139 136  K 3.6 4.0 4.3 4.2 3.2*  CL 103 103 105 107 101  CO2 29 27 25 26 25   GLUCOSE 98 103* 93 91 93  BUN 9 10 9 10 10   CREATININE 1.06 1.19* 1.01 1.13* 1.14*  CALCIUM 8.5 8.5 8.2* 8.7 9.2   CBC:  Lab 10/04/11 0350 10/03/11 0815 10/01/11 1755 10/01/11 0718 09/29/11 0818  WBC 5.7 5.9 8.6 6.4 8.4  HGB 8.3* 9.3* 9.2* 8.3* 9.0*  HCT 25.9* 29.3* 28.7* 25.9* 27.4*  MCV 94.9 96.1 95.7 94.5 94.8  PLT 258 282 265 221 200   Cardiac Enzymes:  Lab 09/29/11 1620 09/29/11 0818 09/29/11 0008  CKTOTAL 32 33 42  CKMB 2.4 2.5 2.4  CKMBINDEX -- -- --  TROPONINI <0.30 <0.30 <0.30   BNP: No components found with this basename: POCBNP:5 CBG:  Lab 10/04/11 0743 10/03/11 0739 10/02/11 0736 10/01/11 0818 09/30/11 0735  GLUCAP 83 86 84 92 79    Recent Results (from the past 240 hour(s))  MRSA PCR SCREENING     Status: Normal   Collection Time   09/29/11  3:28 AM      Component Value Range Status Comment   MRSA by PCR NEGATIVE  NEGATIVE Final      Studies: Dg Chest Port 1 View 10/03/2011   IMPRESSION: 1.  Right upper extremity approach PICC line tip overlies the superior aspect of the right atrium, approximately 2.2 cm caudal to the superior cavoatrial junction.  2. Grossly unchanged findings most suggestive of mild pulmonary edema     Scheduled Meds:   . allopurinol  100 mg Oral Daily  . atorvastatin  40 mg Oral Daily  . colesevelam  1,875 mg Oral BID WC  . mucinex  1 tablet Oral BID  . digoxin  0.125 mg Oral Daily  . diltiazem  300 mg Oral Daily  . Advair  1 puff Inh BID  . furosemide  40 mg Oral Daily  . raloxifene  60 mg Oral Daily  . rivaroxaban  20 mg Oral Daily  . traZODone  25 mg Oral QHS

## 2011-10-04 NOTE — CV Procedure (Signed)
Procedure: TEE  Indication: Atrial fibrillation, pre-cardioversion.  She has been on Xarelto for 3 doses.   Sedation: Versed 2 mg IV, Fentanyl 25 mcg  Findings: Please see echo section of chart for full report.  Normal LV size with mild LV hypertrophy.  EF 55% (45-50% on TTE but looks normal range on today's study).  Mild MR.  No LAA thrombus.  Normal RV size and systolic function.  No ASD or PFO.  Grade IV plaque in descending thoracic aorta.   No complications.   May proceed with DCCV.   Marca Ancona 10/04/2011 2:52 PM

## 2011-10-04 NOTE — Preoperative (Signed)
Beta Blockers   Reason not to administer Beta Blockers:Not Applicable 

## 2011-10-04 NOTE — H&P (View-Only) (Signed)
@   Subjective:  Denies CP; dyspnea improved   Objective:  Filed Vitals:   10/03/11 0920 10/03/11 1347 10/03/11 2045 10/04/11 0544  BP:  95/57 130/81 120/67  Pulse:  94 96 104  Temp:  98.1 F (36.7 C) 98.4 F (36.9 C) 98.1 F (36.7 C)  TempSrc:  Oral Oral Oral  Resp:  20 18 18   Height:      Weight:      SpO2: 97% 93%  92%    Intake/Output from previous day:  Intake/Output Summary (Last 24 hours) at 10/04/11 0865 Last data filed at 10/04/11 0545  Gross per 24 hour  Intake    240 ml  Output   1700 ml  Net  -1460 ml    Physical Exam: Physical exam: Well-developed well-nourished in no acute distress.  Skin is warm and dry.  HEENT is normal.  Neck is supple.  Chest is clear to auscultation with normal expansion.  Cardiovascular exam is irregular, 2/6 systolic murmur Abdominal exam nontender or distended. No masses palpated. Extremities show no edema. neuro grossly intact    Lab Results: Basic Metabolic Panel:  Basename 10/04/11 0350 10/03/11 0815  NA 140 140  K 3.6 4.0  CL 103 103  CO2 29 27  GLUCOSE 98 103*  BUN 9 10  CREATININE 1.06 1.19*  CALCIUM 8.5 8.5  MG 1.5 --  PHOS -- --   CBC:  Basename 10/04/11 0350 10/03/11 0815  WBC 5.7 5.9  NEUTROABS -- --  HGB 8.3* 9.3*  HCT 25.9* 29.3*  MCV 94.9 96.1  PLT 258 282     Assessment/Plan:  #1-atrial fibrillation-the patient remains in atrial fibrillation and her heart rate is mildly elevated. Will continue Cardizem and digoxin. Her blood pressure has been borderline and we therefore will not advance her rate controlling medications. Given difficulties with rate control and the fact that the patient is symptomatic presenting with CHF, feel reestablishing sinus rhythm would be beneficial. Plan to continue xeralto (patient will receive third dose today). Proceed with TEE guided cardioversion today. Add amiodarone (400 Mg PO BID) once she is in sinus rhythm and left atrial appendage thrombus excluded. It should  be noted that she had atrial fibrillation during her last hospitalization as well. However she was in sinus rhythm when she presented in June. Continue gentle diuresis. Follow renal function. #2-recent colitis-continue to follow hemoglobin. No evidence of active bleeding. #3-question atrial septal defect-this can be evaluated at time of TEE. #4-hyperlipidemia-continue statin.  [name] [name] 10/04/2011, 6:21 AM

## 2011-10-04 NOTE — Progress Notes (Signed)
Cardioversion performed 200 joules x 1 at 1459. Converted to NSR.

## 2011-10-04 NOTE — Transfer of Care (Signed)
Immediate Anesthesia Transfer of Care Note  Patient: [name] [name]  Procedure(s) Performed: Procedure(s) (LRB): TRANSESOPHAGEAL ECHOCARDIOGRAM (TEE) (N/A) CARDIOVERSION (N/A)  Patient Location: Endoscopy Unit  Anesthesia Type: General  Level of Consciousness: awake, alert  and oriented  Airway & Oxygen Therapy: Patient Spontanous Breathing and Patient connected to nasal cannula oxygen  Post-op Assessment: Report given to PACU RN and Post -op Vital signs reviewed and stable  Post vital signs: Reviewed and stable  Complications: No apparent anesthesia complications

## 2011-10-04 NOTE — Anesthesia Preprocedure Evaluation (Addendum)
Anesthesia Evaluation  Patient identified by MRN, date of birth, ID band Patient awake    Reviewed: Allergy & Precautions, H&P , NPO status , Patient's Chart, lab work & pertinent test results  History of Anesthesia Complications Negative for: history of anesthetic complications  Airway Mallampati: I TM Distance: >3 FB Neck ROM: full    Dental   Pulmonary shortness of breath and at rest, COPD         Cardiovascular hypertension, Pt. on medications + CAD + dysrhythmias Atrial Fibrillation Rhythm:irregular Rate:Normal     Neuro/Psych    GI/Hepatic Neg liver ROS, hiatal hernia,   Endo/Other  negative endocrine ROS  Renal/GU negative Renal ROS     Musculoskeletal negative musculoskeletal ROS (+)   Abdominal   Peds  Hematology negative hematology ROS (+)   Anesthesia Other Findings   Reproductive/Obstetrics negative OB ROS                          Anesthesia Physical Anesthesia Plan  ASA: III  Anesthesia Plan: General   Post-op Pain Management:    Induction: Intravenous  Airway Management Planned: Mask  Additional Equipment:   Intra-op Plan:   Post-operative Plan:   Informed Consent: I have reviewed the patients History and Physical, chart, labs and discussed the procedure including the risks, benefits and alternatives for the proposed anesthesia with the patient or authorized representative who has indicated his/her understanding and acceptance.     Plan Discussed with: CRNA, Anesthesiologist and Surgeon  Anesthesia Plan Comments:         Anesthesia Quick Evaluation

## 2011-10-04 NOTE — Progress Notes (Signed)
@   Subjective:  Denies CP; dyspnea improved   Objective:  Filed Vitals:   10/03/11 0920 10/03/11 1347 10/03/11 2045 10/04/11 0544  BP:  95/57 130/81 120/67  Pulse:  94 96 104  Temp:  98.1 F (36.7 C) 98.4 F (36.9 C) 98.1 F (36.7 C)  TempSrc:  Oral Oral Oral  Resp:  20 18 18  Height:      Weight:      SpO2: 97% 93%  92%    Intake/Output from previous day:  Intake/Output Summary (Last 24 hours) at 10/04/11 0621 Last data filed at 10/04/11 0545  Gross per 24 hour  Intake    240 ml  Output   1700 ml  Net  -1460 ml    Physical Exam: Physical exam: Well-developed well-nourished in no acute distress.  Skin is warm and dry.  HEENT is normal.  Neck is supple.  Chest is clear to auscultation with normal expansion.  Cardiovascular exam is irregular, 2/6 systolic murmur Abdominal exam nontender or distended. No masses palpated. Extremities show no edema. neuro grossly intact    Lab Results: Basic Metabolic Panel:  Basename 10/04/11 0350 10/03/11 0815  NA 140 140  K 3.6 4.0  CL 103 103  CO2 29 27  GLUCOSE 98 103*  BUN 9 10  CREATININE 1.06 1.19*  CALCIUM 8.5 8.5  MG 1.5 --  PHOS -- --   CBC:  Basename 10/04/11 0350 10/03/11 0815  WBC 5.7 5.9  NEUTROABS -- --  HGB 8.3* 9.3*  HCT 25.9* 29.3*  MCV 94.9 96.1  PLT 258 282     Assessment/Plan:  #1-atrial fibrillation-the patient remains in atrial fibrillation and her heart rate is mildly elevated. [name] continue Cardizem and digoxin. Her blood pressure has been borderline and we therefore [name] not advance her rate controlling medications. Given difficulties with rate control and the fact that the patient is symptomatic presenting with CHF, feel reestablishing sinus rhythm would be beneficial. Plan to continue xeralto (patient [name] receive third dose today). Proceed with TEE guided cardioversion today. Add amiodarone (400 Mg PO BID) once she is in sinus rhythm and left atrial appendage thrombus excluded. It should  be noted that she had atrial fibrillation during her last hospitalization as well. However she was in sinus rhythm when she presented in June. Continue gentle diuresis. Follow renal function. #2-recent colitis-continue to follow hemoglobin. No evidence of active bleeding. #3-question atrial septal defect-this can be evaluated at time of TEE. #4-hyperlipidemia-continue statin.  [name] [name] 10/04/2011, 6:21 AM    

## 2011-10-04 NOTE — Interval H&P Note (Signed)
History and Physical Interval Note:  10/04/2011 2:34 PM  [name] [name]  has presented today for surgery, with the diagnosis of a-fib  The various methods of treatment have been discussed with the patient and family. After consideration of risks, benefits and other options for treatment, the patient has consented to  Procedure(s) (LRB): TRANSESOPHAGEAL ECHOCARDIOGRAM (TEE) (N/A) CARDIOVERSION (N/A) as a surgical intervention .  The patient's history has been reviewed, patient examined, no change in status, stable for surgery.  I have reviewed the patient's chart and labs.  Questions were answered to the patient's satisfaction.     Cindia Hustead [employer]

## 2011-10-04 NOTE — Anesthesia Postprocedure Evaluation (Signed)
  Anesthesia Post-op Note  Patient: [name] [name]  Procedure(s) Performed: Procedure(s) (LRB): TRANSESOPHAGEAL ECHOCARDIOGRAM (TEE) (N/A) CARDIOVERSION (N/A)  Patient Location: PACU  Anesthesia Type: General  Level of Consciousness: awake, alert , oriented and patient cooperative  Airway and Oxygen Therapy: Patient Spontanous Breathing and Patient connected to nasal cannula oxygen  Post-op Pain: none  Post-op Assessment: Post-op Vital signs reviewed, Patient's Cardiovascular Status Stable, Respiratory Function Stable, Patent Airway and No signs of Nausea or vomiting  Post-op Vital Signs: stable  Complications: No apparent anesthesia complications

## 2011-10-05 ENCOUNTER — Encounter (HOSPITAL_COMMUNITY): Payer: Self-pay | Admitting: Cardiology

## 2011-10-05 LAB — CBC
HCT: 25 % — ABNORMAL LOW (ref 36.0–46.0)
MCH: 30.8 pg (ref 26.0–34.0)
MCHC: 32 g/dL (ref 30.0–36.0)
MCV: 96.2 fL (ref 78.0–100.0)
Platelets: 272 10*3/uL (ref 150–400)
RDW: 14 % (ref 11.5–15.5)

## 2011-10-05 LAB — BASIC METABOLIC PANEL
BUN: 8 mg/dL (ref 6–23)
CO2: 29 mEq/L (ref 19–32)
Calcium: 8.5 mg/dL (ref 8.4–10.5)
Chloride: 103 mEq/L (ref 96–112)
Creatinine, Ser: 1.01 mg/dL (ref 0.50–1.10)

## 2011-10-05 MED ORDER — ONDANSETRON HCL 4 MG/2ML IJ SOLN: 4.0000 mg | Freq: Once | INTRAMUSCULAR | Status: AC | PRN

## 2011-10-05 MED ORDER — HYDROMORPHONE HCL PF 1 MG/ML IJ SOLN: 0.2500 mg | INTRAMUSCULAR | Status: DC | PRN

## 2011-10-05 NOTE — Progress Notes (Signed)
@   Subjective:  Denies CP or dyspnea   Objective:  Filed Vitals:   10/04/11 1641 10/04/11 1943 10/04/11 2102 10/05/11 0513  BP: 130/69  99/53 105/67  Pulse: 102  84 85  Temp: 98.2 F (36.8 C)  98.6 F (37 C) 98.6 F (37 C)  TempSrc: Oral  Oral Oral  Resp: 20  21 20   Height:      Weight:    81.1 kg (178 lb 12.7 oz)  SpO2: 94% 94% 97% 94%    Intake/Output from previous day:  Intake/Output Summary (Last 24 hours) at 10/05/11 0727 Last data filed at 10/05/11 0500  Gross per 24 hour  Intake 1173.33 ml  Output   1076 ml  Net  97.33 ml    Physical Exam: Physical exam: Well-developed well-nourished in no acute distress.  Skin is warm and dry.  HEENT is normal.  Neck is supple.  Chest is clear to auscultation with normal expansion.  Cardiovascular exam is irregular, 2/6 systolic murmur Abdominal exam nontender or distended. No masses palpated. Extremities show no edema. neuro grossly intact    Lab Results: Basic Metabolic Panel:  Basename 10/05/11 0420 10/04/11 0350  NA 139 140  K 3.8 3.6  CL 103 103  CO2 29 29  GLUCOSE 89 98  BUN 8 9  CREATININE 1.01 1.06  CALCIUM 8.5 8.5  MG -- 1.5  PHOS -- --   CBC:  Basename 10/05/11 0420 10/04/11 0350  WBC 6.3 5.7  NEUTROABS -- --  HGB 8.0* 8.3*  HCT 25.0* 25.9*  MCV 96.2 94.9  PLT 272 258     Assessment/Plan:  #1-atrial fibrillation-Patient is s/p TEE guided DCCV but reverted to atrial fibrillation; HR is improved; would DC O2 and ambulate; watch HR; continue cardizem but DC digoxin given potential interaction with amiodarone. If HR controlled, could DC on cardizem and amiodarone (400 mg PO BID for 1 week, then 400 mg po daily for additional 3 weeks and then 200 mg po daily). Continue xeralto. Repeat DCCV in 4 weeks after amiodarone loaded which hopefully would help patient maintain sinus rhythm. If rate not controlled, would continue amiodarone load in hosptial. Patient should FU with Dr Clifton James 1 week after DC.  If patient has any dyspnea or elevated HR, would keep in hospital until amiodarone more fully loaded. #2-recent colitis-patient remains anemic and stool heme + most likely from recent colitis. Would repeat CBC in AM in office if she is DCed today. Would check BMET and CBC in one week as well. Note GFR is 53. Continue xeralto at 20 mg daily; if GFR decreases to less than 50, decrease to 15 mg po daily. #3-hyperlipidemia-continue statin.  Olga Millers 10/05/2011, 7:27 AM

## 2011-10-05 NOTE — Progress Notes (Signed)
10-05-11 0215  NSG:  Was reported to me at change of shift  report at 1930 that pt came back from cardio-conversion at 1630 and had been SR c pvc and pacs --- but as look into it further pt returned from procedure was placed back on monitor at 1630 and was SR pac/pvc for a couple of minutes but after 1633 pt converted back  to Afib rate controlled 80's and 90's and has been asymptomatic for me.  Will continue to monitor

## 2011-10-05 NOTE — Progress Notes (Signed)
10-05-11 nsg:  Guiac of stool reveals stool is POSITIVE for Hemacult - pt is on xeralto - JC in pharmacy notified he states dose had been decreased b/c of renal funcion.  Of note.... hemaglobin has been low, pt sob, and in afib.

## 2011-10-05 NOTE — Progress Notes (Signed)
10-05-11 NSG:  Pt had a soft small medium brown bowel movement for me and it tested GUIAC POSITIVE +

## 2011-10-05 NOTE — Progress Notes (Signed)
[facility] PROGRESS NOTE  [name] [name] ZOX:[id number] DOB: [birth year] DOA: 09/28/2011 PCP: [name], MD   Brief narrative: 78 yo  female who presented 09/28/2011 with  progressively worsening shortness of breath started 3 days prior to this admission. Patient was found to be in Atrial Fibrillation.  Patient placed on cardizem and digoxin. Patient had TEE with cardioversion on 7/30, and loaded wth amiodarone and placed on xeralto. patient back to afib but rate controlled.   Assessment/Plan:  Principal Problem:   *Shortness of breath, secondary to atrial fibrillation and acute on chronic systolic and diastolic dysfunction  - related to atrial fibrillation in combination to acute on chronic diastolic and systolic CHF  -TEE with cardioversion done on 7/30.patient back to afib. Rate controlled - 2 D ECHO on this admission EF 40-45% and elevated BNP 1,563  - daily weigths and strict intake and output  - lasix  40 mg PO daily  -  TSH is also within normal limits  - appreciate cardiology recs  Active Problems:  Atrial fibrillation  - continue cardizem 300 mg PO daily , dced digoxin - HR in past 24 hours in high 90s - TEE with DCCV on 7/30, patient back to afib but rate controlled. Cardiology recommends amiodarone (400 mg PO BID for 1 week, then 400 mg po daily for additional 3 weeks and then 200 mg po daily). Continue xeralto. Plan to Repeat DCCV in 4 weeks after amiodarone loaded .  Leukocytosis  - secondary to left lower extremity cellulitis; resolved  - WBC count within normal limits  -D/C clindamycin and ondoxycycline po for additional 5 days   Anemia due to chronic illness  - Hgb 8. today  - on xarelto; monitor in am  Hypokalemia  - supplemented  -  Hyperlipidemia  - continue statin   Acute kidney injury  - creatinine today within normal limits   Hyperglycemia  - dietary restrictions discussed   Code Status: full  Family Communication: none  Disposition  Plan: home with PT      Consultants:  [facility]  Procedures:  TEE on 7/30  Antibiotics:  On doxycycline  HPI/Subjective: Patient feels better today .feels her breathing to be improved.  Objective: Filed Vitals:   10/05/11 0513 10/05/11 0852 10/05/11 0957 10/05/11 1409  BP: 105/67  115/66 106/64  Pulse: 85   83  Temp: 98.6 F (37 C)   97.9 F (36.6 C)  TempSrc: Oral   Oral  Resp: 20   18  Height:      Weight: 81.1 kg (178 lb 12.7 oz)     SpO2: 94% 94%  93%    Intake/Output Summary (Last 24 hours) at 10/05/11 1728 Last data filed at 10/05/11 1700  Gross per 24 hour  Intake   1080 ml  Output   1400 ml  Net   -320 ml    Exam:   Elderly female in NAD  HEENT: no pallor, moist oral mucosa  Chest; clear b/l , no added sounds  CVS: S1 &S2 irregular, non focal  Abd: soft, NT, ND, BS+  EXT: warm, no edema  CNS: AAOX 3 , non focal  Data Reviewed: Basic Metabolic Panel:  Lab 10/05/11 8119 10/04/11 0350 10/03/11 0815 10/01/11 0718 09/29/11 0818 09/29/11 0008  NA 139 140 140 137 139 --  K 3.8 3.6 4.0 4.3 4.2 --  CL 103 103 103 105 107 --  CO2 29 29 27 25 26  --  GLUCOSE 89 98 103* 93 91 --  BUN 8 9 10 9 10  --  CREATININE 1.01 1.06 1.19* 1.01 1.13* --  CALCIUM 8.5 8.5 8.5 8.2* 8.7 --  MG -- 1.5 -- -- -- 1.6  PHOS -- -- -- -- -- 3.5   Liver Function Tests: No results found for this basename: AST:5,ALT:5,ALKPHOS:5,BILITOT:5,PROT:5,ALBUMIN:5 in the last 168 hours No results found for this basename: LIPASE:5,AMYLASE:5 in the last 168 hours No results found for this basename: AMMONIA:5 in the last 168 hours CBC:  Lab 10/05/11 0420 10/04/11 0350 10/03/11 0815 10/01/11 1755 10/01/11 0718  WBC 6.3 5.7 5.9 8.6 6.4  NEUTROABS -- -- -- -- --  HGB 8.0* 8.3* 9.3* 9.2* 8.3*  HCT 25.0* 25.9* 29.3* 28.7* 25.9*  MCV 96.2 94.9 96.1 95.7 94.5  PLT 272 258 282 265 221   Cardiac Enzymes:  Lab 09/29/11 1620 09/29/11 0818 09/29/11 0008  CKTOTAL 32 33 42    CKMB 2.4 2.5 2.4  CKMBINDEX -- -- --  TROPONINI <0.30 <0.30 <0.30   BNP (last 3 results)  Basename 10/01/11 0718 09/29/11 0008 09/28/11 2200  PROBNP 1563.0* 2283.0* 2255.0*   CBG:  Lab 10/05/11 0727 10/04/11 0743 10/03/11 0739 10/02/11 0736 10/01/11 0818  GLUCAP 89 83 86 84 92    Recent Results (from the past 240 hour(s))  MRSA PCR SCREENING     Status: Normal   Collection Time   09/29/11  3:28 AM      Component Value Range Status Comment   MRSA by PCR NEGATIVE  NEGATIVE Final      Studies: Dg Chest 1 View  09/09/2011  *RADIOLOGY REPORT*  Clinical Data: Cough.  Possible aspiration.  CHEST - 1 VIEW  Comparison: 09/06/2011.  09/03/2011.  Findings: Artifact overlies chest.  Right internal jugular central line has its tip in the SVC just above the right atrium.  There is patchy density in both lower lobes left worse than right.  This could be atelectasis or pneumonia.  Findings are similar to the study of 06/29.  Upper lobes remain clear.  IMPRESSION: Patchy atelectasis and/or infiltrate at both lung bases left more than right.  Similar appearance to the study of 06/29.  Original Report Authenticated By: Thomasenia Sales, M.D.   Dg Chest 2 View  09/28/2011  *RADIOLOGY REPORT*  Clinical Data: Shortness of breath and bilateral lower extremity edema.  CHEST - 2 VIEW  Comparison: 09/12/2011  Findings: There is a component of chronic lung disease.  Mild interstitial edema may be present.  Small bilateral pleural effusions are present which appears smaller than on the chest x-ray on July 8.  Stable cardiomegaly.  No focal pulmonary consolidation.  IMPRESSION: Chronic lung disease with potential overlying mild interstitial edema.  Decrease in bilateral pleural effusions since the prior chest x-ray.  Original Report Authenticated By: Reola Calkins, M.D.   Dg Chest 2 View  09/12/2011  *RADIOLOGY REPORT*  Clinical Data: 78 year old female with cough.  Renal failure, septic shock.  CHEST - 2 VIEW   Comparison: 09/09/2011 and earlier.  Findings: Upright AP and lateral views of the chest.  Stable right IJ central line.  Stable right neck surgical clips.  Stable lung volumes.  Small to moderate bilateral pleural effusions.  Continued volume loss at the right base and confluent right retrocardiac opacity.  No pneumothorax or edema.  Stable cardiac size and mediastinal contours.  Stable visualized osseous structures.  IMPRESSION: Bilateral pleural effusions and ongoing right lung base collapse or consolidation.  Appearance suspicious for right lower lobe pneumonia.  Original Report Authenticated By: Harley Hallmark, M.D.   Ct Chest Wo Contrast  09/13/2011  *RADIOLOGY REPORT*  Clinical Data: Bilateral pleural effusions and right lung collapse or consolidation on chest radiograph yesterday.  CT CHEST WITHOUT CONTRAST  Technique:  Multidetector CT imaging of the chest was performed following the standard protocol without IV contrast.  Comparison: Chest radiographs obtained yesterday.  Findings: Right jugular catheter tip at the junction of the superior vena cava and right atrium.  Small to moderate-sized right pleural effusion.  Adjacent right lower lobe airspace consolidation with an appearance most compatible with atelectasis.  Minimal left pleural effusion with adjacent left lower lobe atelectasis. Enlarged heart.  Coronary artery calcifications.  Thoracic spine degenerative changes.  Unremarkable upper abdomen.  IMPRESSION:  1.  Bilateral pleural effusions with associated bilateral lower lobe atelectasis, greater on the right. 2.  Cardiomegaly. 3.  Atheromatous coronary artery calcifications.  Original Report Authenticated By: Darrol Angel, M.D.   Nm Pulmonary Per & Vent  09/28/2011  *RADIOLOGY REPORT*  Clinical Data:  Short of breath.  Rule out PE.  NUCLEAR MEDICINE PERFUSION LUNG SCAN  Technique:  Perfusion images were obtained in multiple projections after intravenous injection of radiopharmaceutical.   Radiopharmaceutical:  3.3 mCi technetium 71m MAA mCi Tc-69m MAA.  Comparison:  None.  Findings: Cardiomegaly is noted.  There is slight patchy perfusion within the left lung.  No definite segmental perfusion defects are identified.  IMPRESSION: No segmental perfusion defects.  The study is negative for pulmonary thromboembolism.  Original Report Authenticated By: Donavan Burnet, M.D.   US Renal Port  09/05/2011  *RADIOLOGY REPORT*  Clinical Data:  Acute renal insufficiency  RENAL/URINARY TRACT ULTRASOUND COMPLETE  Comparison:  08/31/2011  Findings:  Right Kidney:  Measures 10.4 cm. Normal echogenicity.  There is mild scarring and volume loss involving the inferior pole.  No evidence of mass or hydronephrosis.  Left Kidney:  Measures 11.1 cm.  Normal in size and parenchymal echogenicity.  No evidence of mass or hydronephrosis.  Bladder:  Appears normal for degree of bladder distention.  IMPRESSION:  1.  No evidence for obstructive uropathy. 2.  Right renal scarring  Original Report Authenticated By: Rosealee Albee, M.D.   Dg Chest Port 1 View  10/03/2011  *RADIOLOGY REPORT*  Clinical Data: Evaluate PICC line placement  PORTABLE CHEST - 1 VIEW  Comparison: 10/01/2011; 09/28/2011; 09/12/2011; chest CT - 09/13/2011  Findings: Grossly unchanged enlarged cardiac silhouette and mediastinal contours.  Interval placement of right upper extremity approach PICC line with tip projecting over the superior aspect of the right atrium, approximately 2.2 cm caudal to the superior cavoatrial junction.  There is grossly unchanged mild diffuse thickening of the pulmonary interstitium.  No new focal airspace opacities.  No definite pleural effusion or pneumothorax.  Grossly unchanged bones.  Surgical clips overlying the inferior aspect of the right neck.  IMPRESSION: 1.  Right upper extremity approach PICC line tip overlies the superior aspect of the right atrium, approximately 2.2 cm caudal to the superior cavoatrial junction.   2. Grossly unchanged findings most suggestive of mild pulmonary edema  Original Report Authenticated By: Waynard Reeds, M.D.   Dg Chest Port 1 View  10/01/2011  *RADIOLOGY REPORT*  Clinical Data: Congestive heart failure  PORTABLE CHEST - 1 VIEW  Comparison: 09/28/2011  Findings: Cardiomegaly again noted.  Probable small left pleural effusion with left basilar atelectasis or infiltrate.  Slight worsening interstitial prominence.  Superimposed early interstitial edema on  chronic interstitial lung disease cannot be excluded.  IMPRESSION:  Probable small left pleural effusion with left basilar atelectasis or infiltrate.  Slight worsening interstitial prominence. Superimposed early interstitial edema on chronic interstitial lung disease cannot be excluded.  Original Report Authenticated By: Natasha Mead, M.D.   Dg Chest Port 1 View  09/06/2011  *RADIOLOGY REPORT*  Clinical Data: Congestive heart failure  PORTABLE CHEST - 1 VIEW  Comparison: Chest radiograph 09/03/2011  Findings: Right central venous line is in place.  Stable cardiac silhouette.  Central venous pulmonary congestion is increased compared to prior. Interstitial edema is present.  Lung bases are excluded.  No pneumothorax.  IMPRESSION: Increased central venous congestion and interstitial edema.  Original Report Authenticated By: Genevive Bi, M.D.    Scheduled Meds:   . allopurinol  100 mg Oral Daily  . amiodarone  400 mg Oral BID  . antiseptic oral rinse  15 mL Mouth Rinse q12n4p  . antiseptic oral rinse  15 mL Mouth Rinse BID  . atorvastatin  40 mg Oral Daily  . colesevelam  1,875 mg Oral BID WC  . dextromethorphan-guaiFENesin  1 tablet Oral BID  . diltiazem  300 mg Oral Daily  . doxycycline  100 mg Oral Q12H  . Fluticasone-Salmeterol  1 puff Inhalation BID  . furosemide  40 mg Oral Daily  . raloxifene  60 mg Oral Daily  . rivaroxaban  20 mg Oral Daily  . sodium chloride  3 mL Intravenous Q12H  . traZODone  25 mg Oral QHS  .  DISCONTD: digoxin  0.125 mg Oral Daily  . DISCONTD: doxycycline  100 mg Oral Q12H   Continuous Infusions:   . sodium chloride    . DISCONTD: diltiazem (CARDIZEM) infusion Stopped (09/30/11 1923)       Time spent: 35 mins    [name] [name]  [facility] Pager [phone]. If 8PM-8AM, please contact night-coverage at [email or web address], password [facility] 10/05/2011, 5:28 PM  LOS: 7 days

## 2011-10-05 NOTE — Progress Notes (Signed)
Physical Therapy Treatment Patient Details Name: [name] [name] MRN: [id number] DOB: 30-Dec-1923 Today's Date: 10/05/2011 Time: 8469-6295 PT Time Calculation (min): 13 min  PT Assessment / Plan / Recommendation Comments on Treatment Session  Pt. ambulated without O2 w. SATS ranging from 90% to 94%. HR max 115 per monitor clerk. Pt states she may DC to home. Recommend HHPT.    Follow Up Recommendations  Home health PT    Barriers to Discharge        Equipment Recommendations  None recommended by PT    Recommendations for Other Services    Frequency Min 3X/week   Plan Discharge plan remains appropriate;Frequency remains appropriate    Precautions / Restrictions Precautions Precaution Comments: Monitor HR/ sats   Pertinent Vitals/Pain PRE HR89: sats 98%RA During 115: sats 90-94% ra    Mobility  Transfers Sit to Stand: 5: Supervision;With upper extremity assist Ambulation/Gait Ambulation/Gait Assistance: 4: Min guard Ambulation Distance (Feet): 90 Feet Assistive device: Rolling walker Ambulation/Gait Assistance Details: mild dyspnea. Gait Pattern: Step-through pattern Gait velocity: decreased as  became more sob, instructed in pursed lip breathing    Exercises     PT Diagnosis:    PT Problem List:   PT Treatment Interventions:     PT Goals Acute Rehab PT Goals Pt will go Sit to Stand: with modified independence PT Goal: Sit to Stand - Progress: Progressing toward goal Pt will go Stand to Sit: with modified independence PT Goal: Stand to Sit - Progress: Progressing toward goal Pt will Ambulate: 51 - 150 feet;with supervision;with least restrictive assistive device PT Goal: Ambulate - Progress: Progressing toward goal  Visit Information  Last PT Received On: 10/05/11 Assistance Needed: +1    Subjective Data  Subjective: I am supposed to walk w/o O2. It didn't work yesterday.   Cognition  Overall Cognitive Status: Appears within functional limits for tasks  assessed/performed    Balance     End of Session PT - End of Session Activity Tolerance: Patient tolerated treatment well Patient left: with nursing in room (going into BR)   GP     Rada Hay 10/05/2011, 9:43 AM [phone]

## 2011-10-06 DIAGNOSIS — I509 Heart failure, unspecified: Secondary | ICD-10-CM

## 2011-10-06 DIAGNOSIS — L03119 Cellulitis of unspecified part of limb: Secondary | ICD-10-CM

## 2011-10-06 DIAGNOSIS — L03116 Cellulitis of left lower limb: Secondary | ICD-10-CM | POA: Diagnosis present

## 2011-10-06 DIAGNOSIS — I5042 Chronic combined systolic (congestive) and diastolic (congestive) heart failure: Secondary | ICD-10-CM | POA: Diagnosis present

## 2011-10-06 LAB — CBC
HCT: 25.3 % — ABNORMAL LOW (ref 36.0–46.0)
Hemoglobin: 8 g/dL — ABNORMAL LOW (ref 12.0–15.0)
MCH: 30.4 pg (ref 26.0–34.0)
MCHC: 31.6 g/dL (ref 30.0–36.0)
MCV: 96.2 fL (ref 78.0–100.0)
RDW: 13.9 % (ref 11.5–15.5)

## 2011-10-06 MED ORDER — AMIODARONE HCL 400 MG PO TABS: 400.0000 mg | ORAL_TABLET | Freq: Two times a day (BID) | ORAL | Status: DC

## 2011-10-06 MED ORDER — DM-GUAIFENESIN ER 30-600 MG PO TB12: 1.0000 | ORAL_TABLET | Freq: Two times a day (BID) | ORAL | Status: AC

## 2011-10-06 MED ORDER — RIVAROXABAN 20 MG PO TABS: 20.0000 mg | ORAL_TABLET | Freq: Every day | ORAL | Status: DC

## 2011-10-06 NOTE — Progress Notes (Signed)
@   Subjective:  Denies CP or dyspnea   Objective:  Filed Vitals:   10/05/11 2038 10/05/11 2044 10/06/11 0500 10/06/11 0510  BP:  120/64  108/66  Pulse:  92  85  Temp:  98 F (36.7 C)  98.2 F (36.8 C)  TempSrc:  Oral  Oral  Resp:  18  18  Height:      Weight:   79.2 kg (174 lb 9.7 oz)   SpO2: 93% 93%  91%    Intake/Output from previous day:  Intake/Output Summary (Last 24 hours) at 10/06/11 0700 Last data filed at 10/06/11 1610  Gross per 24 hour  Intake    120 ml  Output   1650 ml  Net  -1530 ml    Physical Exam: Physical exam: Well-developed well-nourished in no acute distress.  Skin is warm and dry.  HEENT is normal.  Neck is supple.  Chest is clear to auscultation with normal expansion.  Cardiovascular exam is irregular, 2/6 systolic murmur Abdominal exam nontender or distended. No masses palpated. Extremities show no edema. neuro grossly intact    Lab Results: Basic Metabolic Panel:  Basename 10/05/11 0420 10/04/11 0350  NA 139 140  K 3.8 3.6  CL 103 103  CO2 29 29  GLUCOSE 89 98  BUN 8 9  CREATININE 1.01 1.06  CALCIUM 8.5 8.5  MG -- 1.5  PHOS -- --   CBC:  Basename 10/06/11 0427 10/05/11 0420  WBC 4.7 6.3  NEUTROABS -- --  HGB 8.0* 8.0*  HCT 25.3* 25.0*  MCV 96.2 96.2  PLT 281 272     Assessment/Plan:  #1-atrial fibrillation-Patient is s/p TEE guided DCCV but reverted to atrial fibrillation; HR is improved; would DC on cardizem and amiodarone (400 mg PO BID for 1 week, then 400 mg po daily for additional 3 weeks and then 200 mg po daily). Continue xeralto. Repeat DCCV in 4 weeks after amiodarone loaded which hopefully would help patient maintain sinus rhythm. Patient should FU with Dr Clifton James 1 week after DC.  #2-recent colitis-patient remains anemic and stool heme + most likely from recent colitis. Blood draws also most likely contributing to anemia; no active bleeding and HGB unchanged since yesterday. Continue xeralto at 20 mg daily; if  GFR decreases to less than 50, decrease to 15 mg po daily. Would check CBC and BMET 8/5. #3-hyperlipidemia-continue statin. We will sign off. Please call with questions Olga Millers 10/06/2011, 7:00 AM

## 2011-10-06 NOTE — Discharge Summary (Signed)
Physician Discharge Summary  [name] [name] NWG:[id number] DOB: [birth year] DOA: 09/28/2011  PCP: [name], MD  Admit date: 09/28/2011 Discharge date: 10/06/2011  Recommendations for Outpatient Follow-up:  1. Needs cbc and BMET to check for renal function early next week at PCP office. If GFR <50, her xeralto dose should be reduced to 15 mg daily. 2. 2. Follow up scheduled with Dr Mortimer Fries at [facility] cards on 8/9 at 11:30 am  Discharge Diagnoses:   Principal Problem:  acute CHF ( SYSTOLIC AND DIASTOLIC) Afib with RVR  Active Problems:  Acute renal failure  Anemia due to chronic illness and recent ischemic colitis  Cellulitis of leg, left  Hyperlipidemia  Leukocytosis  Shortness of breath  Hypokalemia  CAD (coronary artery disease)   Discharge Condition: fair  Diet recommendation: cardiac  Wt Readings from Last 3 Encounters:  10/06/11 79.2 kg (174 lb 9.7 oz)  10/06/11 79.2 kg (174 lb 9.7 oz)  09/15/11 82.8 kg (182 lb 8.7 oz)    History of present illness:   78 yo female who presented 09/28/2011 with progressively worsening shortness of breath started 3 days prior to this admission. Patient was found to be in Atrial Fibrillation. Patient placed on cardizem and digoxin. Patient had TEE with cardioversion on 7/30, and loaded wth amiodarone and placed on xeralto. patient back to afib but rate controlled.  Hospital Course:   *Shortness of breath, secondary to atrial fibrillation and acute on chronic systolic and diastolic dysfunction  - related to atrial fibrillation in combination to acute on chronic diastolic and systolic CHF  -TEE with cardioversion done on 7/30.patient back to afib. Rate controlled  - 2 D ECHO on this admission EF 40-45% and elevated BNP 1,563  - daily weigths and strict intake and output  - lasix 40 mg PO daily  - TSH is also within normal limits  -patient now euvolemic. Will continue po lasix on discharge. She should be started on low dose ACEi if  renal function and BP stable on follow up as outpatient given her systolic HF.   Atrial fibrillation with RVR -patient placed on cardizem drip and digoxin  - TEE with DCCV on 7/30, patient back to afib but rate controlled. Cardiology recommends amiodarone (400 mg PO BID for 1 week, then 400 mg po daily for additional 3 weeks and then 200 mg po daily). Continue xeralto. Plan to Repeat DCCV in 4 weeks after amiodarone loaded .  Repeat BMET in PCP office next week . If GFR less than 50 , dose of xetralto to be reduced to 15 mg daily. -she still has irregular rhythm but rate well controlled.   left lower extremity cellulitis;  - WBC count within normal limits  -completed antibiotics course and now resolved  Anemia due to chronic illness  - Hgb 8.on discharge. Her FOBT is positive. Likely in the setting of recent ischemic colitis  - patient on xarelto and needs repeat cbc as outpatient.   Hypokalemia  - supplemented  -  Hyperlipidemia  - continue statin   Acute kidney injury  mild on presentation and resolved subsequently. No clear etiology ( possibly related to diuretics) - creatinine today within normal limits     Procedures:  TEE with DC cardioversion on 7/31  Consultations:  [facility])  Discharge Exam: Filed Vitals:   10/06/11 0510  BP: 108/66  Pulse: 85  Temp: 98.2 F (36.8 C)  Resp: 18   Filed Vitals:   10/05/11 2038 10/05/11 2044 10/06/11 0500 10/06/11  0510  BP:  120/64  108/66  Pulse:  92  85  Temp:  98 F (36.7 C)  98.2 F (36.8 C)  TempSrc:  Oral  Oral  Resp:  18  18  Height:      Weight:   79.2 kg (174 lb 9.7 oz)   SpO2: 93% 93%  91%    Elderly female in NAD  HEENT: no pallor, moist oral mucosa  Chest; clear b/l , no added sounds  CVS: S1 &S2 irregular, non focal  Abd: soft, NT, ND, BS+  EXT: warm, no edema , cellulitis over rt leg resolved CNS: AAOX 3 , non focal   Discharge Instructions  Discharge Orders    Future  Appointments: Provider: Department: Dept Phone: Center:   10/14/2011 11:30 AM Beatrice Lecher, PA [facility] [phone] [facility]     Medication List  As of 10/06/2011 10:53 AM   TAKE these medications         allopurinol 100 MG tablet   Commonly known as: ZYLOPRIM   Take 100 mg by mouth daily.      amiodarone 400 MG tablet   Commonly known as: PACERONE   Take 1 tablet (400 mg total) by mouth 2 (two) times daily.      atorvastatin 40 MG tablet   Commonly known as: LIPITOR   Take 40 mg by mouth daily.      colesevelam 625 MG tablet   Commonly known as: WELCHOL   Take 1,875 mg by mouth 2 (two) times daily with a meal.      dextromethorphan-guaiFENesin 30-600 MG per 12 hr tablet   Commonly known as: MUCINEX DM   Take 1 tablet by mouth 2 (two) times daily.      diltiazem 240 MG 24 hr capsule   Commonly known as: CARDIZEM CD   Take 1 capsule (240 mg total) by mouth daily.      Fluticasone-Salmeterol 250-50 MCG/DOSE Aepb   Commonly known as: ADVAIR   Inhale 1 puff into the lungs 2 (two) times daily.      furosemide 40 MG tablet   Commonly known as: LASIX   Take 40 mg by mouth daily.      levalbuterol 45 MCG/ACT inhaler   Commonly known as: XOPENEX HFA   Inhale 1-2 puffs into the lungs every 4 (four) hours as needed for wheezing.      omega-3 acid ethyl esters 1 G capsule   Commonly known as: LOVAZA   Take 1 g by mouth 2 (two) times daily.      raloxifene 60 MG tablet   Commonly known as: EVISTA   Take 60 mg by mouth daily.      Rivaroxaban 20 MG Tabs   Commonly known as: XARELTO   Take 1 tablet (20 mg total) by mouth daily.      traZODone 50 MG tablet   Commonly known as: DESYREL   Take 25 mg by mouth at bedtime. Take 1/2 tablet at bedtime for sleep           Follow-up Information    Follow up with [name], MD. (please check cbc and BMET to evalaute renal function on 8/5. if GFR less than 50, the dose of xeralto should be reduced to 15 mg daily)      Contact information:   [address] [place] [address] [phone]       Follow up with Verne Carrow, MD on 10/14/2011. (at 11:30 am)  Contact information:   [facility] [address] [place] [address] [phone]           The results of significant diagnostics from this hospitalization (including imaging, microbiology, ancillary and laboratory) are listed below for reference.    Significant Diagnostic Studies: Dg Chest 1 View  09/09/2011  *RADIOLOGY REPORT*  Clinical Data: Cough.  Possible aspiration.  CHEST - 1 VIEW  Comparison: 09/06/2011.  09/03/2011.  Findings: Artifact overlies chest.  Right internal jugular central line has its tip in the SVC just above the right atrium.  There is patchy density in both lower lobes left worse than right.  This could be atelectasis or pneumonia.  Findings are similar to the study of 06/29.  Upper lobes remain clear.  IMPRESSION: Patchy atelectasis and/or infiltrate at both lung bases left more than right.  Similar appearance to the study of 06/29.  Original Report Authenticated By: Thomasenia Sales, M.D.   Dg Chest 2 View  09/28/2011  *RADIOLOGY REPORT*  Clinical Data: Shortness of breath and bilateral lower extremity edema.  CHEST - 2 VIEW  Comparison: 09/12/2011  Findings: There is a component of chronic lung disease.  Mild interstitial edema may be present.  Small bilateral pleural effusions are present which appears smaller than on the chest x-ray on July 8.  Stable cardiomegaly.  No focal pulmonary consolidation.  IMPRESSION: Chronic lung disease with potential overlying mild interstitial edema.  Decrease in bilateral pleural effusions since the prior chest x-ray.  Original Report Authenticated By: Reola Calkins, M.D.   Dg Chest 2 View  09/12/2011  *RADIOLOGY REPORT*  Clinical Data: 78 year old female with cough.  Renal failure, septic shock.  CHEST - 2 VIEW  Comparison:  09/09/2011 and earlier.  Findings: Upright AP and lateral views of the chest.  Stable right IJ central line.  Stable right neck surgical clips.  Stable lung volumes.  Small to moderate bilateral pleural effusions.  Continued volume loss at the right base and confluent right retrocardiac opacity.  No pneumothorax or edema.  Stable cardiac size and mediastinal contours.  Stable visualized osseous structures.  IMPRESSION: Bilateral pleural effusions and ongoing right lung base collapse or consolidation.  Appearance suspicious for right lower lobe pneumonia.  Original Report Authenticated By: Harley Hallmark, M.D.   Ct Chest Wo Contrast  09/13/2011  *RADIOLOGY REPORT*  Clinical Data: Bilateral pleural effusions and right lung collapse or consolidation on chest radiograph yesterday.  CT CHEST WITHOUT CONTRAST  Technique:  Multidetector CT imaging of the chest was performed following the standard protocol without IV contrast.  Comparison: Chest radiographs obtained yesterday.  Findings: Right jugular catheter tip at the junction of the superior vena cava and right atrium.  Small to moderate-sized right pleural effusion.  Adjacent right lower lobe airspace consolidation with an appearance most compatible with atelectasis.  Minimal left pleural effusion with adjacent left lower lobe atelectasis. Enlarged heart.  Coronary artery calcifications.  Thoracic spine degenerative changes.  Unremarkable upper abdomen.  IMPRESSION:  1.  Bilateral pleural effusions with associated bilateral lower lobe atelectasis, greater on the right. 2.  Cardiomegaly. 3.  Atheromatous coronary artery calcifications.  Original Report Authenticated By: Darrol Angel, M.D.   Nm Pulmonary Per & Vent  09/28/2011  *RADIOLOGY REPORT*  Clinical Data:  Short of breath.  Rule out PE.  NUCLEAR MEDICINE PERFUSION LUNG SCAN  Technique:  Perfusion images were obtained in multiple projections after intravenous injection of radiopharmaceutical.   Radiopharmaceutical:  3.3 mCi technetium 26m MAA mCi Tc-53m MAA.  Comparison:  None.  Findings: Cardiomegaly is noted.  There is slight patchy perfusion within the left lung.  No definite segmental perfusion defects are identified.  IMPRESSION: No segmental perfusion defects.  The study is negative for pulmonary thromboembolism.  Original Report Authenticated By: Donavan Burnet, M.D.   Dg Chest Port 1 View  10/03/2011  *RADIOLOGY REPORT*  Clinical Data: Evaluate PICC line placement  PORTABLE CHEST - 1 VIEW  Comparison: 10/01/2011; 09/28/2011; 09/12/2011; chest CT - 09/13/2011  Findings: Grossly unchanged enlarged cardiac silhouette and mediastinal contours.  Interval placement of right upper extremity approach PICC line with tip projecting over the superior aspect of the right atrium, approximately 2.2 cm caudal to the superior cavoatrial junction.  There is grossly unchanged mild diffuse thickening of the pulmonary interstitium.  No new focal airspace opacities.  No definite pleural effusion or pneumothorax.  Grossly unchanged bones.  Surgical clips overlying the inferior aspect of the right neck.  IMPRESSION: 1.  Right upper extremity approach PICC line tip overlies the superior aspect of the right atrium, approximately 2.2 cm caudal to the superior cavoatrial junction.  2. Grossly unchanged findings most suggestive of mild pulmonary edema  Original Report Authenticated By: Waynard Reeds, M.D.   Dg Chest Port 1 View  10/01/2011  *RADIOLOGY REPORT*  Clinical Data: Congestive heart failure  PORTABLE CHEST - 1 VIEW  Comparison: 09/28/2011  Findings: Cardiomegaly again noted.  Probable small left pleural effusion with left basilar atelectasis or infiltrate.  Slight worsening interstitial prominence.  Superimposed early interstitial edema on chronic interstitial lung disease cannot be excluded.  IMPRESSION:  Probable small left pleural effusion with left basilar atelectasis or infiltrate.  Slight worsening  interstitial prominence. Superimposed early interstitial edema on chronic interstitial lung disease cannot be excluded.  Original Report Authenticated By: Natasha Mead, M.D.    Microbiology: Recent Results (from the past 240 hour(s))  MRSA PCR SCREENING     Status: Normal   Collection Time   09/29/11  3:28 AM      Component Value Range Status Comment   MRSA by PCR NEGATIVE  NEGATIVE Final      Labs: Basic Metabolic Panel:  Lab 10/05/11 1610 10/04/11 0350 10/03/11 0815 10/01/11 0718  NA 139 140 140 137  K 3.8 3.6 4.0 4.3  CL 103 103 103 105  CO2 29 29 27 25   GLUCOSE 89 98 103* 93  BUN 8 9 10 9   CREATININE 1.01 1.06 1.19* 1.01  CALCIUM 8.5 8.5 8.5 8.2*  MG -- 1.5 -- --  PHOS -- -- -- --   Liver Function Tests: No results found for this basename: AST:5,ALT:5,ALKPHOS:5,BILITOT:5,PROT:5,ALBUMIN:5 in the last 168 hours No results found for this basename: LIPASE:5,AMYLASE:5 in the last 168 hours No results found for this basename: AMMONIA:5 in the last 168 hours CBC:  Lab 10/06/11 0427 10/05/11 0420 10/04/11 0350 10/03/11 0815 10/01/11 1755  WBC 4.7 6.3 5.7 5.9 8.6  NEUTROABS -- -- -- -- --  HGB 8.0* 8.0* 8.3* 9.3* 9.2*  HCT 25.3* 25.0* 25.9* 29.3* 28.7*  MCV 96.2 96.2 94.9 96.1 95.7  PLT 281 272 258 282 265   Cardiac Enzymes:  Lab 09/29/11 1620  CKTOTAL 32  CKMB 2.4  CKMBINDEX --  TROPONINI <0.30   BNP: BNP (last 3 results)  Basename 10/01/11 0718 09/29/11 0008 09/28/11 2200  PROBNP 1563.0* 2283.0* 2255.0*   CBG:  Lab 10/06/11 0759 10/05/11 0727 10/04/11 0743 10/03/11  0739 10/02/11 0736  GLUCAP 102* 89 83 86 84    Time coordinating discharge: 45 minutes  Signed:  Stephonie Wilcoxen  [facility] 10/06/2011, 10:53 AM

## 2011-10-14 ENCOUNTER — Ambulatory Visit (INDEPENDENT_AMBULATORY_CARE_PROVIDER_SITE_OTHER): Payer: Medicare Other | Admitting: Physician Assistant

## 2011-10-14 ENCOUNTER — Encounter: Payer: Self-pay | Admitting: Physician Assistant

## 2011-10-14 VITALS — BP 127/77 | HR 82 | Ht 61.0 in | Wt 172.0 lb

## 2011-10-14 DIAGNOSIS — R42 Dizziness and giddiness: Secondary | ICD-10-CM

## 2011-10-14 DIAGNOSIS — I2510 Atherosclerotic heart disease of native coronary artery without angina pectoris: Secondary | ICD-10-CM

## 2011-10-14 DIAGNOSIS — I5032 Chronic diastolic (congestive) heart failure: Secondary | ICD-10-CM

## 2011-10-14 DIAGNOSIS — I10 Essential (primary) hypertension: Secondary | ICD-10-CM

## 2011-10-14 DIAGNOSIS — I4891 Unspecified atrial fibrillation: Secondary | ICD-10-CM

## 2011-10-14 LAB — BASIC METABOLIC PANEL
CO2: 26 mEq/L (ref 19–32)
Chloride: 102 mEq/L (ref 96–112)
Creatinine, Ser: 1.2 mg/dL (ref 0.4–1.2)
Glucose, Bld: 90 mg/dL (ref 70–99)

## 2011-10-14 LAB — CBC WITH DIFFERENTIAL/PLATELET
Basophils Relative: 0.7 % (ref 0.0–3.0)
Eosinophils Absolute: 0.1 10*3/uL (ref 0.0–0.7)
Eosinophils Relative: 1.4 % (ref 0.0–5.0)
HCT: 28.1 % — ABNORMAL LOW (ref 36.0–46.0)
Lymphs Abs: 1.4 10*3/uL (ref 0.7–4.0)
MCHC: 32.5 g/dL (ref 30.0–36.0)
MCV: 94.9 fl (ref 78.0–100.0)
Monocytes Absolute: 0.6 10*3/uL (ref 0.1–1.0)
Platelets: 288 10*3/uL (ref 150.0–400.0)
RBC: 2.97 Mil/uL — ABNORMAL LOW (ref 3.87–5.11)
WBC: 7.2 10*3/uL (ref 4.5–10.5)

## 2011-10-14 NOTE — Patient Instructions (Addendum)
Your physician recommends that you schedule a follow-up appointment in: 3 WEEKS WITH DR. Clifton James, IF NOT AVAILABLE THEN WITH SCOTT WEAVER, PAC SAME DAY DR. Clifton James IS IN THE OFFICE   Your physician recommends that you return for lab work in: TODAY BMET, CBC W/DIFF, MAG

## 2011-10-14 NOTE — Progress Notes (Addendum)
[address] [place], [place]  [address] Phone: [phone] Fax:  [phone]  Date:  10/14/2011   Name:  [name] [name]   DOB:  08-Nov-1923   MRN:  [id number]  PCP:  [name], MD  Primary Cardiologist:  Dr. Verne Carrow  Primary Electrophysiologist:  None    History of Present Illness: [name] [name] is a 78 y.o. female who returns for post hospital followup. She is a history of CAD, HTN, HL, COPD, carotid stenosis, status post bilateral CEA. LHC 9/05: Proximal LAD less than 20%, D1 20-30%, ostial RCA 40-50%, proximal RCA 20%, EF 60%.   She was admitted 7/24-8/1 with dyspnea and noted to have atrial fibrillation with RVR. V/Q scan was negative for pulmonary embolism. Of note, she had recently been discharged after a prolonged hospitalization in 6/13 for Salmonella enteritis with septic shock. Xarelto was started for anticoagulation. She had signs of volume overload and was treated with IV Lasix.   Echocardiogram 09/29/11: Mild LVH, EF 45-50%, diffuse HK, mild to moderate aortic stenosis, mean gradient 12 mmHg, moderate MAC, mild MR, moderate LAE, moderate RAE, question small secundum ASD with left to right flow not seen in 4 chamber view, PASP 35-39 (mild pulmonary hypertension).   She was placed on amiodarone and underwent TEE-DCCV. However, she reverted back to atrial fibrillation. Amiodarone was continued. It was felt that she could undergo repeat DCCV after fully loaded with amiodarone.   Of note, TEE on 10/04/11: Mild LVH, EF 55%, mild MR, no defect or PFO-echo contrast study showed no right-to-left atrial level shunt.  She reports symptoms of dizziness.  Described as spinning.  Occurs with standing.  Notes good days and bad days.  Thinks it may be related to poor sleep.  No syncope or near syncope.  No chest pain.  No dyspnea.  No orthopnea, PND.  She has LE edema but this seems more dependent than anything.     Wt Readings from Last 3 Encounters:    10/14/11 172 lb (78.019 kg)  10/06/11 174 lb 9.7 oz (79.2 kg)  10/06/11 174 lb 9.7 oz (79.2 kg)     Past Medical History  Diagnosis Date  . Hypertension   . Hyperlipidemia   . COPD (chronic obstructive pulmonary disease)   . Osteoporosis   . Arthritis   . H/O hiatal hernia   . Chronic back pain   . CAD (coronary artery disease)     Cath 2005 with moderate CAD, Dr. [place]  . Atrial fibrillation     Current Outpatient Prescriptions  Medication Sig Dispense Refill  . allopurinol (ZYLOPRIM) 100 MG tablet Take 100 mg by mouth daily.       Marland Kitchen amiodarone (PACERONE) 400 MG tablet Take 1 tablet (400 mg total) by mouth 2 (two) times daily.  60 tablet  0  . atorvastatin (LIPITOR) 40 MG tablet Take 40 mg by mouth daily.      . colesevelam (WELCHOL) 625 MG tablet Take 1,875 mg by mouth 2 (two) times daily with a meal.      . dexlansoprazole (DEXILANT) 60 MG capsule Take 60 mg by mouth daily.      Marland Kitchen dextromethorphan-guaiFENesin (MUCINEX DM) 30-600 MG per 12 hr tablet Take 1 tablet by mouth 2 (two) times daily.  20 tablet  0  . diltiazem (CARDIZEM CD) 240 MG 24 hr capsule Take 1 capsule (240 mg total) by mouth daily.  30 capsule  0  . fish oil-omega-3 fatty acids 1000  MG capsule Take 2 g by mouth daily.      . Fluticasone-Salmeterol (ADVAIR) 250-50 MCG/DOSE AEPB Inhale 1 puff into the lungs 2 (two) times daily.  60 each  0  . furosemide (LASIX) 40 MG tablet Take 40 mg by mouth daily.      Marland Kitchen levalbuterol (XOPENEX HFA) 45 MCG/ACT inhaler Inhale 1-2 puffs into the lungs every 4 (four) hours as needed for wheezing.  1 Inhaler  12  . raloxifene (EVISTA) 60 MG tablet Take 60 mg by mouth daily.      . Rivaroxaban (XARELTO) 20 MG TABS Take 1 tablet (20 mg total) by mouth daily.  30 tablet  0  . traZODone (DESYREL) 50 MG tablet Take 25 mg by mouth at bedtime. Take 1/2 tablet at bedtime for sleep        Allergies: Allergies  Allergen Reactions  . Demerol (Meperidine) Hives  . Penicillins Hives     History  Substance Use Topics  . Smoking status: Former Smoker -- 1.0 packs/day for 20 years    Types: Cigarettes    Quit date: 09/01/1983  . Smokeless tobacco: Never Used  . Alcohol Use: No     ROS:  Please see the history of present illness.   No melena, hematochezia, hematuria, hemoptysis.  All other systems reviewed and negative.   PHYSICAL EXAM: VS:  BP 127/77  Pulse 82  Ht 5\' 1"  (1.549 m)  Wt 172 lb (78.019 kg)  BMI 32.50 kg/m2  Well nourished, well developed, in no acute distress HEENT: normal Neck: no JVD at 90 Cardiac:  normal S1, S2; irregularly irregular rhythm; no murmur Lungs:  clear to auscultation bilaterally, no wheezing, rhonchi or rales Abd: soft, nontender Ext: Trace bilateral LE edema Skin: warm and dry Neuro:  CNs 2-12 intact, no focal abnormalities noted  EKG:  Atrial fibrillation, heart rate 99, frequent PVCs  ASSESSMENT AND PLAN:  1. Atrial Fibrillation Rate is fairly well controlled. She is now on amiodarone 400 mg daily. She remains on Xarelto.  She does complain of dizziness. Question if this is related to her amiodarone. She does have some orthostatic symptoms. I will check some orthostatic vital signs today. Plan followup in 3 weeks. Check a basic metabolic panel and CBC today. If she remains in atrial fibrillation at followup, plan repeat cardioversion.  2. Chronic Diastolic CHF She had signs of volume overload in the hospital in the setting of atrial fibrillation with RVR. Volume appears stable today. Check a basic metabolic panel.  3. Dizziness Possibly related to amiodarone. If her orthostatic vital signs demonstrated significant drop, I will decrease her Lasix dose.  4. Hypertension Controlled  5. Coronary Artery Disease No angina. Continue Lipitor. She is not on aspirin as she is on Xarelto.  Signed, Tereso Newcomer, PA-C  12:13 PM 10/14/2011

## 2011-10-19 ENCOUNTER — Telehealth: Payer: Self-pay | Admitting: *Deleted

## 2011-10-19 NOTE — Telephone Encounter (Signed)
Message copied by Tarri Fuller on Wed Oct 19, 2011 10:07 AM ------      Message from: [place], [place] T      Created: Mon Oct 17, 2011  8:22 AM       Renal fxn stable      Hgb stable      Tereso Newcomer, [place]  8:22 AM 10/17/2011

## 2011-10-19 NOTE — Telephone Encounter (Signed)
s/w pt's daughter [name] and she is aware of lab results and gave me verbal understanding today

## 2011-10-21 ENCOUNTER — Telehealth: Payer: Self-pay | Admitting: Cardiovascular Disease

## 2011-10-21 NOTE — Telephone Encounter (Signed)
Spoke with pt's daughter who reports pt missed doses of Xarelto on August 11 and 12.  She resumed on October 18, 2011 and has not missed any doses since then.  She is scheduled to see Dr. Clifton James on November 08, 2011 to discuss possible cardioversion.  I told daughter I would make Dr. Clifton James aware.

## 2011-10-21 NOTE — Telephone Encounter (Signed)
Reviewed thanks! 

## 2011-10-21 NOTE — Telephone Encounter (Signed)
New msg Pt's daughter called and said she missed xarelto or amiodarone. She is concerned. Please call

## 2011-10-31 ENCOUNTER — Other Ambulatory Visit: Payer: Self-pay | Admitting: Internal Medicine

## 2011-10-31 ENCOUNTER — Ambulatory Visit
Admission: RE | Admit: 2011-10-31 | Discharge: 2011-10-31 | Disposition: A | Discharge status: Home / Self Care | Payer: Medicare Other | Source: Ambulatory Visit | Attending: Internal Medicine | Admitting: Internal Medicine

## 2011-10-31 DIAGNOSIS — R05 Cough: Secondary | ICD-10-CM

## 2011-11-02 ENCOUNTER — Emergency Department (HOSPITAL_COMMUNITY): Payer: Medicare Other

## 2011-11-02 ENCOUNTER — Encounter (HOSPITAL_COMMUNITY): Payer: Self-pay | Admitting: Nurse Practitioner

## 2011-11-02 ENCOUNTER — Inpatient Hospital Stay (HOSPITAL_COMMUNITY)
Admission: EM | Admit: 2011-11-02 | Discharge: 2011-11-04 | DRG: 372 | Disposition: A | Discharge status: Home / Self Care | Payer: Medicare Other | Attending: Internal Medicine | Admitting: Internal Medicine

## 2011-11-02 DIAGNOSIS — N189 Chronic kidney disease, unspecified: Secondary | ICD-10-CM | POA: Diagnosis present

## 2011-11-02 DIAGNOSIS — K529 Noninfective gastroenteritis and colitis, unspecified: Secondary | ICD-10-CM

## 2011-11-02 DIAGNOSIS — D72829 Elevated white blood cell count, unspecified: Secondary | ICD-10-CM

## 2011-11-02 DIAGNOSIS — D638 Anemia in other chronic diseases classified elsewhere: Secondary | ICD-10-CM

## 2011-11-02 DIAGNOSIS — E669 Obesity, unspecified: Secondary | ICD-10-CM | POA: Diagnosis present

## 2011-11-02 DIAGNOSIS — I4891 Unspecified atrial fibrillation: Secondary | ICD-10-CM

## 2011-11-02 DIAGNOSIS — E876 Hypokalemia: Secondary | ICD-10-CM

## 2011-11-02 DIAGNOSIS — J4489 Other specified chronic obstructive pulmonary disease: Secondary | ICD-10-CM | POA: Diagnosis present

## 2011-11-02 DIAGNOSIS — R197 Diarrhea, unspecified: Secondary | ICD-10-CM

## 2011-11-02 DIAGNOSIS — E785 Hyperlipidemia, unspecified: Secondary | ICD-10-CM | POA: Diagnosis present

## 2011-11-02 DIAGNOSIS — Z8619 Personal history of other infectious and parasitic diseases: Secondary | ICD-10-CM

## 2011-11-02 DIAGNOSIS — R531 Weakness: Secondary | ICD-10-CM

## 2011-11-02 DIAGNOSIS — A0472 Enterocolitis due to Clostridium difficile, not specified as recurrent: Principal | ICD-10-CM

## 2011-11-02 DIAGNOSIS — N179 Acute kidney failure, unspecified: Secondary | ICD-10-CM

## 2011-11-02 DIAGNOSIS — I959 Hypotension, unspecified: Secondary | ICD-10-CM

## 2011-11-02 DIAGNOSIS — J449 Chronic obstructive pulmonary disease, unspecified: Secondary | ICD-10-CM | POA: Diagnosis present

## 2011-11-02 DIAGNOSIS — D631 Anemia in chronic kidney disease: Secondary | ICD-10-CM | POA: Diagnosis present

## 2011-11-02 HISTORY — DX: Enterocolitis due to Clostridium difficile, not specified as recurrent: A04.72

## 2011-11-02 LAB — COMPREHENSIVE METABOLIC PANEL
ALT: 9 U/L (ref 0–35)
AST: 11 U/L (ref 0–37)
Albumin: 2.8 g/dL — ABNORMAL LOW (ref 3.5–5.2)
Calcium: 8.7 mg/dL (ref 8.4–10.5)
GFR calc Af Amer: 46 mL/min — ABNORMAL LOW (ref >90)
Glucose, Bld: 119 mg/dL — ABNORMAL HIGH (ref 70–99)
Sodium: 134 mEq/L — ABNORMAL LOW (ref 135–145)
Total Protein: 6.3 g/dL (ref 6.0–8.3)

## 2011-11-02 LAB — CBC WITH DIFFERENTIAL/PLATELET
Basophils Absolute: 0 10*3/uL (ref 0.0–0.1)
Basophils Relative: 0 % (ref 0–1)
Eosinophils Absolute: 0 10*3/uL (ref 0.0–0.7)
Eosinophils Relative: 0 % (ref 0–5)
MCH: 30.4 pg (ref 26.0–34.0)
MCHC: 32.1 g/dL (ref 30.0–36.0)
MCV: 94.5 fL (ref 78.0–100.0)
Platelets: 213 10*3/uL (ref 150–400)
RDW: 14.6 % (ref 11.5–15.5)

## 2011-11-02 LAB — URINALYSIS, ROUTINE W REFLEX MICROSCOPIC
Nitrite: NEGATIVE
Specific Gravity, Urine: 1.024 (ref 1.005–1.030)
pH: 5.5 (ref 5.0–8.0)

## 2011-11-02 LAB — URINE MICROSCOPIC-ADD ON

## 2011-11-02 MED ORDER — HYDROMORPHONE HCL PF 1 MG/ML IJ SOLN: 1.0000 mg | Freq: Once | INTRAMUSCULAR | Status: DC

## 2011-11-02 MED ORDER — ONDANSETRON HCL 4 MG/2ML IJ SOLN: 4.0000 mg | Freq: Once | INTRAMUSCULAR | Status: DC

## 2011-11-02 MED ORDER — METRONIDAZOLE IN NACL 5-0.79 MG/ML-% IV SOLN
500.0000 mg | Freq: Once | INTRAVENOUS | Status: AC
  Administered 2011-11-03: 500 mg via INTRAVENOUS
  Filled 2011-11-02: qty 100

## 2011-11-02 MED ORDER — SODIUM CHLORIDE 0.9 % IV SOLN
Freq: Once | INTRAVENOUS | Status: AC
  Administered 2011-11-02: 21:00:00 via INTRAVENOUS

## 2011-11-02 NOTE — ED Notes (Signed)
Pt and family at bedside report weakness and diarrhea x3 days, pt w/ nausea and some dry heaves however denies any actual vomiting. Pt recently placed on Omnicef for bronchitis and pneumonia. Pt w/ hx of admission for salmonella, c-diff and hosp acquired pneumonia. Pt also states her physician notified her that her hgb was 8.0.

## 2011-11-02 NOTE — ED Notes (Signed)
Patient transported to X-ray 

## 2011-11-02 NOTE — ED Notes (Signed)
Notified pt that urine sample was needed.

## 2011-11-02 NOTE — ED Notes (Signed)
Pt. Treated with antibiotics from PCP. Pt. Has had severe diarrhea and dry heaves for the last two days. Pt has moderate intermittent lower abdominal pain. Denies nausea. Pt. Has decreased appetite. Tenting noted. Capillary refill <3 seconds.

## 2011-11-02 NOTE — ED Notes (Signed)
Pt and family report pt is very difficutly PIV start, pt has had to have PICC and EJ for access during previous admission - IV team paged for IV start.

## 2011-11-02 NOTE — ED Provider Notes (Signed)
History     CSN: [id number]  Arrival date & time 11/02/11  1819   First MD Initiated Contact with Patient 11/02/11 2007      Chief Complaint  Patient presents with  . Diarrhea  . Weakness    (Consider location/radiation/quality/duration/timing/severity/associated sxs/prior treatment) Patient is a 78 y.o. female presenting with diarrhea. The history is provided by the patient.  Diarrhea The primary symptoms include fatigue, abdominal pain, nausea, vomiting and diarrhea. Primary symptoms do not include fever, melena or dysuria. The illness began 3 to 5 days ago. The onset was gradual.  The illness does not include chills. Associated symptoms comments: She has had multiple previous admissions since June: 6/26 to 7/11 for Sepsis; Salmonella, then later C. Diff.; A-fib; acute renal failure secondary to CT CM and Cipro.   7/24 to 8/1 for SOB secondary to A-fib with RVR; renal failure (resolved on discharge).  She was started back on antibiotics (Omnicef) 3 days ago by her primary care physician for PNA and has started having diarrhea, having 3-5 bowel movements daily without bleeding. No fever, "I've felt warm". She has had nausea with vomiting and complains of abdominal pain. She reports feeling generally weak without falls, syncope or near syncope. Per daughter who is at bedside, she received a call from the patient's physician today who reported that her hemoglobin was 8.0. She has a remote history of anemia requiring transfusion (x 20 years). .    Past Medical History  Diagnosis Date  . Hypertension   . Hyperlipidemia   . COPD (chronic obstructive pulmonary disease)   . Osteoporosis   . Arthritis   . H/O hiatal hernia   . Chronic back pain   . CAD (coronary artery disease)     Cath 2005 with moderate CAD, Dr. [place]  . Atrial fibrillation   . C. difficile diarrhea     Past Surgical History  Procedure Date  . Carotid endarterectomy     bilateral  . Cataract extraction,  bilateral   . Joint replacement 2008    Right Total Knee  . Anal fistula repair   . Left parotidectomy   . Eye surgery   . Flexible sigmoidoscopy 09/02/2011    Procedure: FLEXIBLE SIGMOIDOSCOPY;  Surgeon: Theda Belfast, MD;  Location: [facility] ENDOSCOPY;  Service: Endoscopy;  Laterality: N/A;  . Flexible sigmoidoscopy 09/03/2011    Procedure: FLEXIBLE SIGMOIDOSCOPY;  Surgeon: Theda Belfast, MD;  Location: [facility] ENDOSCOPY;  Service: Endoscopy;  Laterality: N/A;  . Tee without cardioversion 10/04/2011    Procedure: TRANSESOPHAGEAL ECHOCARDIOGRAM (TEE);  Surgeon: Laurey Morale, MD;  Location: [facility] ENDOSCOPY;  Service: Cardiovascular;  Laterality: N/A;  to be carelinked here by 1230-verified 7/29/dl  . Cardioversion 10/04/2011    Procedure: CARDIOVERSION;  Surgeon: Laurey Morale, MD;  Location: [facility] ENDOSCOPY;  Service: Cardiovascular;  Laterality: N/A;    History reviewed. No pertinent family history.  History  Substance Use Topics  . Smoking status: Former Smoker -- 1.0 packs/day for 20 years    Types: Cigarettes    Quit date: 09/01/1983  . Smokeless tobacco: Never Used  . Alcohol Use: No    OB History    Grav Para Term Preterm Abortions TAB SAB Ect Mult Living                  Review of Systems  Constitutional: Positive for fatigue. Negative for fever and chills.  HENT: Negative.   Respiratory: Negative.  Negative for cough and shortness of breath.  Cardiovascular: Negative.  Negative for chest pain.  Gastrointestinal: Positive for nausea, vomiting, abdominal pain and diarrhea. Negative for melena.  Genitourinary: Negative for dysuria.  Musculoskeletal: Negative.   Skin: Negative.  Negative for pallor.  Neurological: Positive for weakness.  Psychiatric/Behavioral: Negative for confusion.    Allergies  Baby powder; Demerol; and Penicillins  Home Medications   Current Outpatient Rx  Name Route Sig Dispense Refill  . ALLOPURINOL 100 MG PO TABS Oral Take 100 mg by mouth daily.      . AMIODARONE HCL 200 MG PO TABS Oral Take 200 mg by mouth 2 (two) times daily.    . ATORVASTATIN CALCIUM 40 MG PO TABS Oral Take 40 mg by mouth daily.    Marland Kitchen CEFDINIR 300 MG PO CAPS Oral Take 300 mg by mouth 2 (two) times daily.    . COLESEVELAM HCL 625 MG PO TABS Oral Take 1,875 mg by mouth 2 (two) times daily with a meal.    . DEXLANSOPRAZOLE 60 MG PO CPDR Oral Take 60 mg by mouth daily.    Marland Kitchen DILTIAZEM HCL ER COATED BEADS 240 MG PO CP24 Oral Take 1 capsule (240 mg total) by mouth daily. 30 capsule 0  . OMEGA-3 FATTY ACIDS 1000 MG PO CAPS Oral Take 1 g by mouth 2 (two) times daily.     Marland Kitchen FLUTICASONE-SALMETEROL 250-50 MCG/DOSE IN AEPB Inhalation Inhale 1 puff into the lungs 2 (two) times daily. 60 each 0  . FUROSEMIDE 40 MG PO TABS Oral Take 40 mg by mouth daily.    Marland Kitchen LEVALBUTEROL TARTRATE 45 MCG/ACT IN AERO Inhalation Inhale 1-2 puffs into the lungs every 4 (four) hours as needed for wheezing. 1 Inhaler 12  . RALOXIFENE HCL 60 MG PO TABS Oral Take 60 mg by mouth daily.    Marland Kitchen RIVAROXABAN 20 MG PO TABS Oral Take 1 tablet (20 mg total) by mouth daily. 30 tablet 0    BP 125/61  Pulse 66  Temp 99.2 F (37.3 C) (Oral)  Resp 23  SpO2 93%  Physical Exam  Constitutional: She is oriented to person, place, and time. She appears well-developed and well-nourished. No distress.  HENT:  Head: Normocephalic.  Mouth/Throat: Oropharynx is clear and moist.  Eyes:       Mild conjunctival pallor.   Neck: Normal range of motion. Neck supple.  Cardiovascular: Normal rate and regular rhythm.   No murmur heard. Pulmonary/Chest: Effort normal and breath sounds normal.  Abdominal: Soft. Bowel sounds are normal. She exhibits mass. She exhibits no distension. There is no tenderness. There is no rebound and no guarding.  Musculoskeletal: Normal range of motion. She exhibits no edema.  Neurological: She is alert and oriented to person, place, and time.  Skin: Skin is warm and dry. No rash noted.  Psychiatric:  She has a normal mood and affect.    ED Course  Procedures (including critical care time)  Labs Reviewed  CBC WITH DIFFERENTIAL - Abnormal; Notable for the following:    WBC 17.2 (*)     RBC 2.93 (*)     Hemoglobin 8.9 (*)     HCT 27.7 (*)     Neutrophils Relative 90 (*)     Neutro Abs 15.4 (*)     Lymphocytes Relative 5 (*)     All other components within normal limits  COMPREHENSIVE METABOLIC PANEL - Abnormal; Notable for the following:    Sodium 134 (*)     Glucose, Bld 119 (*)  Creatinine, Ser 1.20 (*)     Albumin 2.8 (*)     GFR calc non Af Amer 40 (*)     GFR calc Af Amer 46 (*)     All other components within normal limits  URINALYSIS, ROUTINE W REFLEX MICROSCOPIC  .No results found.  Date: 11/02/2011  Rate: 97  Rhythm: atrial fibrillation  QRS Axis: normal  Intervals: normal  ST/T Wave abnormalities: normal  Conduction Disutrbances:none  Narrative Interpretation:   Old EKG Reviewed: unchanged   Results for orders placed during the hospital encounter of 11/02/11  CBC WITH DIFFERENTIAL      Component Value Range   WBC 17.2 (*) 4.0 - 10.5 K/uL   RBC 2.93 (*) 3.87 - 5.11 MIL/uL   Hemoglobin 8.9 (*) 12.0 - 15.0 g/dL   HCT 78.2 (*) 95.6 - 21.3 %   MCV 94.5  78.0 - 100.0 fL   MCH 30.4  26.0 - 34.0 pg   MCHC 32.1  30.0 - 36.0 g/dL   RDW 08.6  57.8 - 46.9 %   Platelets 213  150 - 400 K/uL   Neutrophils Relative 90 (*) 43 - 77 %   Neutro Abs 15.4 (*) 1.7 - 7.7 K/uL   Lymphocytes Relative 5 (*) 12 - 46 %   Lymphs Abs 0.8  0.7 - 4.0 K/uL   Monocytes Relative 5  3 - 12 %   Monocytes Absolute 0.9  0.1 - 1.0 K/uL   Eosinophils Relative 0  0 - 5 %   Eosinophils Absolute 0.0  0.0 - 0.7 K/uL   Basophils Relative 0  0 - 1 %   Basophils Absolute 0.0  0.0 - 0.1 K/uL  COMPREHENSIVE METABOLIC PANEL      Component Value Range   Sodium 134 (*) 135 - 145 mEq/L   Potassium 3.9  3.5 - 5.1 mEq/L   Chloride 102  96 - 112 mEq/L   CO2 23  19 - 32 mEq/L   Glucose, Bld 119  (*) 70 - 99 mg/dL   BUN 14  6 - 23 mg/dL   Creatinine, Ser 6.29 (*) 0.50 - 1.10 mg/dL   Calcium 8.7  8.4 - 52.8 mg/dL   Total Protein 6.3  6.0 - 8.3 g/dL   Albumin 2.8 (*) 3.5 - 5.2 g/dL   AST 11  0 - 37 U/L   ALT 9  0 - 35 U/L   Alkaline Phosphatase 69  39 - 117 U/L   Total Bilirubin 0.8  0.3 - 1.2 mg/dL   GFR calc non Af Amer 40 (*) >90 mL/min   GFR calc Af Amer 46 (*) >90 mL/min  URINALYSIS, ROUTINE W REFLEX MICROSCOPIC      Component Value Range   Color, Urine YELLOW  YELLOW   APPearance CLOUDY (*) CLEAR   Specific Gravity, Urine 1.024  1.005 - 1.030   pH 5.5  5.0 - 8.0   Glucose, UA NEGATIVE  NEGATIVE mg/dL   Hgb urine dipstick NEGATIVE  NEGATIVE   Bilirubin Urine SMALL (*) NEGATIVE   Ketones, ur TRACE (*) NEGATIVE mg/dL   Protein, ur 30 (*) NEGATIVE mg/dL   Urobilinogen, UA 0.2  0.0 - 1.0 mg/dL   Nitrite NEGATIVE  NEGATIVE   Leukocytes, UA SMALL (*) NEGATIVE  URINE MICROSCOPIC-ADD ON      Component Value Range   Squamous Epithelial / LPF MANY (*) RARE   WBC, UA 7-10  <3 WBC/hpf   RBC / HPF 0-2  <3  RBC/hpf   Bacteria, UA MANY (*) RARE   Casts HYALINE CASTS (*) NEGATIVE   Urine-Other MUCOUS PRESENT     Dg Chest 2 View  10/31/2011  *RADIOLOGY REPORT*  Clinical Data: Cough and congestion  CHEST - 2 VIEW  Comparison: 10/03/2011  Findings: The cardiac shadow is again mildly enlarged.  Previously seen right-sided PICC line has been removed in the interval.  The lungs are well-aerated bilaterally with evidence of right basilar infiltrate and likely small effusion.  IMPRESSION: Right basilar infiltrate.   Original Report Authenticated By: Phillips Odor, M.D.    Dg Abd Acute W/chest  11/02/2011  *RADIOLOGY REPORT*  Clinical Data: Abdominal pain.  Recent surgery.  Nausea.  ACUTE ABDOMEN SERIES (ABDOMEN 2 VIEW & CHEST 1 VIEW)  Comparison: Two-view chest 10/31/2011.  CT abdomen pelvis 08/31/2011.  Findings: The heart is enlarged.  Bilateral pleural effusions are worse right than  left.  Bibasilar airspace disease persists.  Supine and upright views the abdomen demonstrate a nonspecific bowel gas pattern.  There is no evidence for obstruction or free air.  Rightward scoliosis of the lumbar spine is stable.  IMPRESSION:  1.  Cardiomegaly and bilateral pleural effusions. 2.  Mild bibasilar airspace disease likely reflects atelectasis. 3.  No acute abnormality of the abdomen. 4.  Scoliosis.   Original Report Authenticated By: Jamesetta Orleans. MATTERN, M.D.    No diagnosis found. 1. Diarrhea 2. History of C. Diff Colitis 3. Weakness 4. Leukocytosis   MDM  Patient comfortable, declines any medication.   Dr. Silverio Lay in to see patient. Will obtain CT scan to r/o abscess. Plan to admit. Discussed with Dr. Elisabeth Pigeon with [facility].  Rodena Medin, PA-C 11/03/11 0001

## 2011-11-02 NOTE — ED Notes (Signed)
[id number]<WG> Expected date:<BR> Expected time:<BR> Means of arrival:<BR> Comments:<BR> Hold triage

## 2011-11-03 ENCOUNTER — Encounter (HOSPITAL_COMMUNITY): Payer: Self-pay | Admitting: Radiology

## 2011-11-03 DIAGNOSIS — A0472 Enterocolitis due to Clostridium difficile, not specified as recurrent: Secondary | ICD-10-CM | POA: Diagnosis present

## 2011-11-03 DIAGNOSIS — E876 Hypokalemia: Secondary | ICD-10-CM

## 2011-11-03 DIAGNOSIS — R197 Diarrhea, unspecified: Secondary | ICD-10-CM | POA: Diagnosis present

## 2011-11-03 DIAGNOSIS — I959 Hypotension, unspecified: Secondary | ICD-10-CM

## 2011-11-03 LAB — GLUCOSE, CAPILLARY: Glucose-Capillary: 82 mg/dL (ref 70–99)

## 2011-11-03 LAB — COMPREHENSIVE METABOLIC PANEL
ALT: 8 U/L (ref 0–35)
AST: 10 U/L (ref 0–37)
Albumin: 2.4 g/dL — ABNORMAL LOW (ref 3.5–5.2)
Alkaline Phosphatase: 69 U/L (ref 39–117)
CO2: 24 mEq/L (ref 19–32)
Chloride: 104 mEq/L (ref 96–112)
GFR calc non Af Amer: 41 mL/min — ABNORMAL LOW (ref >90)
Potassium: 3.3 mEq/L — ABNORMAL LOW (ref 3.5–5.1)
Sodium: 135 mEq/L (ref 135–145)
Total Bilirubin: 0.6 mg/dL (ref 0.3–1.2)

## 2011-11-03 LAB — CBC
MCH: 30.5 pg (ref 26.0–34.0)
MCHC: 32.3 g/dL (ref 30.0–36.0)
MCV: 94.4 fL (ref 78.0–100.0)
Platelets: 163 10*3/uL (ref 150–400)

## 2011-11-03 LAB — LACTIC ACID, PLASMA: Lactic Acid, Venous: 0.4 mmol/L — ABNORMAL LOW (ref 0.5–2.2)

## 2011-11-03 LAB — PROCALCITONIN: Procalcitonin: 0.33 ng/mL

## 2011-11-03 MED ORDER — ONDANSETRON HCL 4 MG/2ML IJ SOLN: 4.0000 mg | Freq: Four times a day (QID) | INTRAMUSCULAR | Status: DC | PRN

## 2011-11-03 MED ORDER — RALOXIFENE HCL 60 MG PO TABS
60.0000 mg | ORAL_TABLET | Freq: Every day | ORAL | Status: DC
  Administered 2011-11-03 – 2011-11-04 (×2): 60 mg via ORAL
  Filled 2011-11-03 (×2): qty 1

## 2011-11-03 MED ORDER — HYDROCODONE-ACETAMINOPHEN 5-325 MG PO TABS: 1.0000 | ORAL_TABLET | ORAL | Status: DC | PRN

## 2011-11-03 MED ORDER — AMIODARONE HCL 200 MG PO TABS
200.0000 mg | ORAL_TABLET | Freq: Two times a day (BID) | ORAL | Status: DC
  Administered 2011-11-03 – 2011-11-04 (×4): 200 mg via ORAL
  Filled 2011-11-03 (×5): qty 1

## 2011-11-03 MED ORDER — FLUTICASONE-SALMETEROL 250-50 MCG/DOSE IN AEPB
1.0000 | INHALATION_SPRAY | Freq: Two times a day (BID) | RESPIRATORY_TRACT | Status: DC
  Administered 2011-11-03 – 2011-11-04 (×2): 1 via RESPIRATORY_TRACT
  Filled 2011-11-03: qty 14

## 2011-11-03 MED ORDER — SACCHAROMYCES BOULARDII 250 MG PO CAPS
250.0000 mg | ORAL_CAPSULE | Freq: Two times a day (BID) | ORAL | Status: DC
  Administered 2011-11-03 – 2011-11-04 (×2): 250 mg via ORAL
  Filled 2011-11-03 (×4): qty 1

## 2011-11-03 MED ORDER — SODIUM CHLORIDE 0.9 % IJ SOLN
3.0000 mL | Freq: Two times a day (BID) | INTRAMUSCULAR | Status: DC
  Administered 2011-11-03 – 2011-11-04 (×3): 3 mL via INTRAVENOUS

## 2011-11-03 MED ORDER — POTASSIUM CHLORIDE CRYS ER 20 MEQ PO TBCR
40.0000 meq | EXTENDED_RELEASE_TABLET | Freq: Once | ORAL | Status: AC
  Administered 2011-11-03: 40 meq via ORAL
  Filled 2011-11-03 (×2): qty 2

## 2011-11-03 MED ORDER — DILTIAZEM HCL ER COATED BEADS 240 MG PO CP24
240.0000 mg | ORAL_CAPSULE | Freq: Every day | ORAL | Status: DC
  Administered 2011-11-03 – 2011-11-04 (×2): 240 mg via ORAL
  Filled 2011-11-03 (×2): qty 1

## 2011-11-03 MED ORDER — ACETAMINOPHEN 325 MG PO TABS: 650.0000 mg | ORAL_TABLET | Freq: Four times a day (QID) | ORAL | Status: DC | PRN

## 2011-11-03 MED ORDER — RIVAROXABAN 15 MG PO TABS
15.0000 mg | ORAL_TABLET | Freq: Every day | ORAL | Status: DC
  Administered 2011-11-04: 15 mg via ORAL
  Filled 2011-11-03 (×2): qty 1

## 2011-11-03 MED ORDER — ONDANSETRON HCL 4 MG PO TABS: 4.0000 mg | ORAL_TABLET | Freq: Four times a day (QID) | ORAL | Status: DC | PRN

## 2011-11-03 MED ORDER — COLESEVELAM HCL 625 MG PO TABS
1875.0000 mg | ORAL_TABLET | Freq: Two times a day (BID) | ORAL | Status: DC
  Administered 2011-11-03 – 2011-11-04 (×3): 1875 mg via ORAL
  Filled 2011-11-03 (×6): qty 3

## 2011-11-03 MED ORDER — RIVAROXABAN 20 MG PO TABS
20.0000 mg | ORAL_TABLET | Freq: Every day | ORAL | Status: DC
  Administered 2011-11-03: 20 mg via ORAL
  Filled 2011-11-03: qty 1

## 2011-11-03 MED ORDER — PANTOPRAZOLE SODIUM 40 MG PO TBEC
40.0000 mg | DELAYED_RELEASE_TABLET | Freq: Every day | ORAL | Status: DC
  Administered 2011-11-03: 40 mg via ORAL
  Filled 2011-11-03: qty 1

## 2011-11-03 MED ORDER — ATORVASTATIN CALCIUM 40 MG PO TABS
40.0000 mg | ORAL_TABLET | Freq: Every day | ORAL | Status: DC
  Administered 2011-11-03: 40 mg via ORAL
  Filled 2011-11-03 (×2): qty 1

## 2011-11-03 MED ORDER — ALLOPURINOL 100 MG PO TABS
100.0000 mg | ORAL_TABLET | Freq: Every day | ORAL | Status: DC
  Administered 2011-11-03 – 2011-11-04 (×2): 100 mg via ORAL
  Filled 2011-11-03 (×2): qty 1

## 2011-11-03 MED ORDER — SODIUM CHLORIDE 0.9 % IV SOLN
INTRAVENOUS | Status: AC
  Administered 2011-11-03: 05:00:00 via INTRAVENOUS

## 2011-11-03 MED ORDER — FAMOTIDINE 20 MG PO TABS
20.0000 mg | ORAL_TABLET | Freq: Two times a day (BID) | ORAL | Status: DC
  Administered 2011-11-04: 20 mg via ORAL
  Filled 2011-11-03 (×2): qty 1

## 2011-11-03 MED ORDER — LEVALBUTEROL TARTRATE 45 MCG/ACT IN AERO
1.0000 | INHALATION_SPRAY | RESPIRATORY_TRACT | Status: DC | PRN
  Filled 2011-11-03: qty 15

## 2011-11-03 MED ORDER — METRONIDAZOLE IN NACL 5-0.79 MG/ML-% IV SOLN
500.0000 mg | Freq: Three times a day (TID) | INTRAVENOUS | Status: DC
  Administered 2011-11-03 – 2011-11-04 (×3): 500 mg via INTRAVENOUS
  Filled 2011-11-03 (×4): qty 100

## 2011-11-03 MED ORDER — ACETAMINOPHEN 650 MG RE SUPP: 650.0000 mg | Freq: Four times a day (QID) | RECTAL | Status: DC | PRN

## 2011-11-03 NOTE — ED Notes (Signed)
Awaiting pt to have CT before transfer to floor.

## 2011-11-03 NOTE — ED Provider Notes (Signed)
Medical screening examination/treatment/procedure(s) were conducted as a shared visit with non-physician practitioner(s) and myself.  I personally evaluated the patient during the encounter  [name] [name] is a 78 y.o. female hx of C diff with diarrhea. Was recently on omnicef. Exam showed a tender lower abdomen. Labs showed WBC 26. CT ab/pel ordered. Patient given flagyl empirically for C diff. Hospitalist will f/u ct ab/pel.    Richardean Canal, MD 11/03/11 0005

## 2011-11-03 NOTE — H&P (Signed)
[facility] History and Physical  [name] [name] IHK:[id number] DOB: [birth year] DOA: 11/02/2011  Referring physician: ER physician PCP: [name], MD   Chief Complaint: diarrhea  HPI:  78 year old female with history of recently diagnosed atrial fibrillation (on xarelto), COPD, HTN, dyslipidemia who presented to ED with intractable diarrhea ever since initiating antibiotics for bronchitis (omnicef). Patient reports lower abdominal pain, 6/10 in intensity, mostly on left side, non radiating. She has not taken any pain relieving medication but reports diarrhea is aggravating the pain. No reports of blood in stool or urine, no chest pain, no palpitations, no shortness of breath, no cough. No lightheadedness or dizziness or loss of consciousness.  Assessment and Plan:  Principal Problem:  *Diarrhea - possible C.diff colitis due to recent antibiotic intake - follow up stool for C.diff - may continue IV fluids for another 12 hours, NS @ 50 cc/hr - follow up CT abdomen/pelvis results  Active Problems:  Hypotension - hold off on BP meds - gentle IV hydration for next 12 hours - check procalcitonin and lactic acid level   Hyperlipidemia - continue statin   Leukocytosis - perhaps due to colitis - management asa above   Atrial fibrillation - rate controlled with amiodarone - continue xarelto - continue Cardizem   Acute on chronic kidney disease - this may be due to lasix - will hold lasix for now due to hypotension   Anemia due to chronic illness - hemoglobin 8.9 on admission - no signs of active bleed - repeat CBC in am and transfuse PRN  Code Status: Full Family Communication: Daughter [name] updated at bedside Disposition Plan: Admit for further evaluation to telemetry floor  Manson Passey, MD  [facility] Pager [phone]  If 7PM-7AM, please contact night-coverage [email or web address] Password [email or web address] 11/03/2011, 12:25 AM  Review of Systems:    Constitutional: Negative for fever, chills and malaise/fatigue. Negative for diaphoresis.  HENT: Negative for hearing loss, ear pain, nosebleeds, congestion, sore throat, neck pain, tinnitus and ear discharge.   Eyes: Negative for blurred vision, double vision, photophobia, pain, discharge and redness.  Respiratory: Negative for cough, hemoptysis, sputum production, shortness of breath, wheezing and stridor.   Cardiovascular: Negative for chest pain, palpitations, orthopnea, claudication and leg swelling.  Gastrointestinal: per hpi  Genitourinary: Negative for dysuria, urgency, frequency, hematuria and flank pain.  Musculoskeletal: Negative for myalgias, back pain, joint pain and falls.  Skin: Negative for itching and rash.  Neurological: Negative for dizziness and weakness. Negative for tingling, tremors, sensory change, speech change, focal weakness, loss of consciousness and headaches.  Endo/Heme/Allergies: Negative for environmental allergies and polydipsia. Does not bruise/bleed easily.  Psychiatric/Behavioral: Negative for suicidal ideas. The patient is not nervous/anxious.      Past Medical History  Diagnosis Date  . Hypertension   . Hyperlipidemia   . COPD (chronic obstructive pulmonary disease)   . Osteoporosis   . Arthritis   . H/O hiatal hernia   . Chronic back pain   . CAD (coronary artery disease)     Cath 2005 with moderate CAD, Dr. [place]  . Atrial fibrillation   . C. difficile diarrhea    Past Surgical History  Procedure Date  . Carotid endarterectomy     bilateral  . Cataract extraction, bilateral   . Joint replacement 2008    Right Total Knee  . Anal fistula repair   . Left parotidectomy   . Eye surgery   . Flexible sigmoidoscopy 09/02/2011    Procedure: FLEXIBLE SIGMOIDOSCOPY;  Surgeon: Theda Belfast, MD;  Location: [facility] ENDOSCOPY;  Service: Endoscopy;  Laterality: N/A;  . Flexible sigmoidoscopy 09/03/2011    Procedure: FLEXIBLE SIGMOIDOSCOPY;  Surgeon:  Theda Belfast, MD;  Location: [facility] ENDOSCOPY;  Service: Endoscopy;  Laterality: N/A;  . Tee without cardioversion 10/04/2011    Procedure: TRANSESOPHAGEAL ECHOCARDIOGRAM (TEE);  Surgeon: Laurey Morale, MD;  Location: [facility] ENDOSCOPY;  Service: Cardiovascular;  Laterality: N/A;  to be carelinked here by 1230-verified 7/29/dl  . Cardioversion 10/04/2011    Procedure: CARDIOVERSION;  Surgeon: Laurey Morale, MD;  Location: [facility] ENDOSCOPY;  Service: Cardiovascular;  Laterality: N/A;   Social History:  reports that she quit smoking about 28 years ago. Her smoking use included Cigarettes. She has a 20 pack-year smoking history. She has never used smokeless tobacco. She reports that she does not drink alcohol or use illicit drugs.  Allergies  Allergen Reactions  . Baby Powder (Methylbenzethonium) Shortness Of Breath  . Demerol (Meperidine) Hives  . Penicillins Hives    Family History: hypothyroidism in mother, htn in both parents  Prior to Admission medications   Medication Sig Start Date End Date Taking? Authorizing Provider  allopurinol (ZYLOPRIM) 100 MG tablet Take 100 mg by mouth daily.    Yes Historical Provider, MD  amiodarone (PACERONE) 200 MG tablet Take 200 mg by mouth 2 (two) times daily.   Yes Historical Provider, MD  atorvastatin (LIPITOR) 40 MG tablet Take 40 mg by mouth daily.   Yes Historical Provider, MD  cefdinir (OMNICEF) 300 MG capsule Take 300 mg by mouth 2 (two) times daily.   Yes Historical Provider, MD  colesevelam (WELCHOL) 625 MG tablet Take 1,875 mg by mouth 2 (two) times daily with a meal.   Yes Historical Provider, MD  dexlansoprazole (DEXILANT) 60 MG capsule Take 60 mg by mouth daily.   Yes Historical Provider, MD  diltiazem (CARDIZEM CD) 240 MG 24 hr capsule Take 1 capsule (240 mg total) by mouth daily. 09/15/11 09/14/12 Yes Adeline Joselyn Glassman, MD  fish oil-omega-3 fatty acids 1000 MG capsule Take 1 g by mouth 2 (two) times daily.    Yes Historical Provider, MD    Fluticasone-Salmeterol (ADVAIR) 250-50 MCG/DOSE AEPB Inhale 1 puff into the lungs 2 (two) times daily. 09/15/11  Yes Adeline C Viyuoh, MD  furosemide (LASIX) 40 MG tablet Take 40 mg by mouth daily.   Yes Historical Provider, MD  levalbuterol [facility] HFA) 45 MCG/ACT inhaler Inhale 1-2 puffs into the lungs every 4 (four) hours as needed for wheezing. 09/15/11 09/14/12 Yes Adeline Joselyn Glassman, MD  raloxifene (EVISTA) 60 MG tablet Take 60 mg by mouth daily.   Yes Historical Provider, MD  Rivaroxaban (XARELTO) 20 MG TABS Take 1 tablet (20 mg total) by mouth daily. 10/06/11  Yes Eddie North, MD   Physical Exam: Filed Vitals:   11/02/11 1826 11/02/11 2135 11/02/11 2259  BP: 125/61  98/49  Pulse: 66    Temp: 99.2 F (37.3 C) 100.7 F (38.2 C)   TempSrc: Oral Rectal   Resp: 23  27  SpO2: 93%  95%    Physical Exam  Constitutional: Appears well-developed and well-nourished. No distress.  HENT: Normocephalic. External right and left ear normal. Oropharynx is clear and moist.  Eyes: Conjunctivae and EOM are normal. PERRLA, no scleral icterus.  Neck: Normal ROM. Neck supple. No JVD. No tracheal deviation. No thyromegaly.  CVS: irregular rhythm, rate controlled, S1/S2 +, no murmurs, no gallops, no carotid bruit.  Pulmonary: Effort and breath  sounds normal, no stridor, rhonchi, wheezes, rales.  Abdominal: Soft. BS +,  no distension, appreciated tenderness in lower left abdomen, rebound or guarding.  Musculoskeletal: Normal range of motion. No edema and no tenderness.  Lymphadenopathy: No lymphadenopathy noted, cervical, inguinal. Neuro: Alert. Normal reflexes, muscle tone coordination. No cranial nerve deficit. Skin: Skin is warm and dry. No rash noted. Not diaphoretic. No erythema. No pallor.  Psychiatric: Normal mood and affect. Behavior, judgment, thought content normal.   Labs on Admission:  Basic Metabolic Panel:  Lab 11/02/11 1610  NA 134*  K 3.9  CL 102  CO2 23  GLUCOSE 119*  BUN 14   CREATININE 1.20*  CALCIUM 8.7  MG --  PHOS --   Liver Function Tests:  Lab 11/02/11 1929  AST 11  ALT 9  ALKPHOS 69  BILITOT 0.8  PROT 6.3  ALBUMIN 2.8*   CBC:  Lab 11/02/11 1929  WBC 17.2*  NEUTROABS 15.4*  HGB 8.9*  HCT 27.7*  MCV 94.5  PLT 213   Radiological Exams on Admission: Dg Abd Acute W/chest 11/02/2011  *.  IMPRESSION:  1.  Cardiomegaly and bilateral pleural effusions. 2.  Mild bibasilar airspace disease likely reflects atelectasis. 3.  No acute abnormality of the abdomen. 4.  Scoliosis.     EKG: Not done on admission

## 2011-11-03 NOTE — Progress Notes (Signed)
   CARE MANAGEMENT NOTE 11/03/2011  Patient:  Pinkhasov,Aritha F   Account Number:  [id number]  Date Initiated:  11/03/2011  Documentation initiated by:  Jiles Crocker  Subjective/Objective Assessment:   ADMITTED WITH NAUSEA/ VOMITING, DIARRHEA, POSSIBLE C DIFF     Action/Plan:   PCP: [name], MD  LIVES AT HOME ALONE, IS ACTIVE WITH [facility] FOR HHRN/PT   Anticipated DC Date:  11/10/2011   Anticipated DC Plan:  HOME W HOME HEALTH SERVICES      DC Planning Services  CM consult              Status of service:  In process, will continue to follow Medicare Important Message given?  NA - LOS <3 / Initial given by admissions (If response is "NO", the following Medicare IM given date fields will be blank)  Per UR Regulation:  Reviewed for med. necessity/level of care/duration of stay  Comments:  11/03/2011- B Ellanore Vanhook RN, BSN, MHA

## 2011-11-03 NOTE — Progress Notes (Signed)
[facility] PROGRESS NOTE  Tekisha Darcey Luba YNW:[id number] DOB: 1923/12/28 DOA: 11/02/2011 PCP: [name], MD  Assessment/Plan: Principal Problem:  *Diarrhea Active Problems:  Colitis presumed infectious  Hypotension  Hyperlipidemia  Leukocytosis  Atrial fibrillation  Anemia due to chronic illness  1. C. difficile colitis: This is her second episode. Avoid antibiotics and PPIs in the future unless absolutely indicated. Continue metronidazole and add probiotics. Improving. 2. Hypotension: Soft blood pressures but asymptomatic. 3. Hypokalemia: Replete and follow BMP in a.m. 4. Anemia: Stable. Follow CBCs in a.m. 5. Atrial fibrillation: Rate controlled. Continue Cardizem, Xarelto and amiodarone. 6. Chronic kidney disease: Stable creatinines.  Code Status: Full Family Communication: Discussed with patient's daughter at bedside, updated care and answered questions. Disposition Plan: Discharge home when diarrhea is consistently improved.   Brief narrative: 78 year old female with history of recently diagnosed atrial fibrillation (on xarelto), COPD, HTN, dyslipidemia who presented to ED with intractable diarrhea ever since initiating antibiotics for bronchitis (omnicef). She had been treated for C. difficile colitis in June and had resolved.   Consultants:  None.  Procedures:  None.  Antibiotics:  None.  HPI/Subjective: Patient says she's feeling significantly better. She feels stronger. Diarrhea is improved and did not have any BM this morning.  Objective: Filed Vitals:   11/03/11 0229 11/03/11 0500 11/03/11 0544 11/03/11 1406  BP: 99/54  110/58 99/56  Pulse: 90  104 83  Temp: 98.1 F (36.7 C)  97.8 F (36.6 C) 98.2 F (36.8 C)  TempSrc: Oral  Oral Oral  Resp: 24  22 18   Height: 5\' 1"  (1.549 m)     Weight: 76.4 kg (168 lb 6.9 oz) 77.4 kg (170 lb 10.2 oz)    SpO2: 96%  98% 98%    Intake/Output Summary (Last 24 hours) at 11/03/11 1855 Last data filed at  11/03/11 1652  Gross per 24 hour  Intake 859.17 ml  Output    150 ml  Net 709.17 ml   Filed Weights   11/03/11 0229 11/03/11 0500  Weight: 76.4 kg (168 lb 6.9 oz) 77.4 kg (170 lb 10.2 oz)    Exam:  General exam: Obese lady, sitting comfortably on chair and in no obvious distress. Respiratory system: Clear. No increased work of breathing. Cardiovascular system: S1 and S2 heard, irregularly irregular. No JVD, murmurs he had Gastrointestinal system: Abdomen is obese but soft and nontender. No organomegaly or masses appreciated. Normal bowel sounds heard. Central nervous system: Alert and oriented. No focal neurological deficits. Extremities: Symmetric 5 x 5 power.  Data Reviewed: Basic Metabolic Panel:  Lab 11/03/11 6578 11/02/11 1929  NA 135 134*  K 3.3* 3.9  CL 104 102  CO2 24 23  GLUCOSE 90 119*  BUN 14 14  CREATININE 1.18* 1.20*  CALCIUM 8.3* 8.7  MG -- --  PHOS -- --   Liver Function Tests:  Lab 11/03/11 0430 11/02/11 1929  AST 10 11  ALT 8 9  ALKPHOS 69 69  BILITOT 0.6 0.8  PROT 5.5* 6.3  ALBUMIN 2.4* 2.8*   No results found for this basename: LIPASE:5,AMYLASE:5 in the last 168 hours No results found for this basename: AMMONIA:5 in the last 168 hours CBC:  Lab 11/03/11 0430 11/02/11 1929  WBC 13.5* 17.2*  NEUTROABS -- 15.4*  HGB 8.1* 8.9*  HCT 25.1* 27.7*  MCV 94.4 94.5  PLT 163 213   Cardiac Enzymes: No results found for this basename: CKTOTAL:5,CKMB:5,CKMBINDEX:5,TROPONINI:5 in the last 168 hours BNP (last 3 results)  Basename  10/01/11 0718 09/29/11 0008 09/28/11 2200  PROBNP 1563.0* 2283.0* 2255.0*   CBG:  Lab 11/03/11 0750  GLUCAP 82    Recent Results (from the past 240 hour(s))  CLOSTRIDIUM DIFFICILE BY PCR     Status: Abnormal   Collection Time   11/03/11  1:35 AM      Component Value Range Status Comment   C difficile by pcr POSITIVE (*) NEGATIVE Final      Studies: Ct Abdomen Pelvis Wo Contrast  11/03/2011  *RADIOLOGY REPORT*   Clinical Data: Diarrhea, weakness.  CT ABDOMEN AND PELVIS WITHOUT CONTRAST  Technique:  Multidetector CT imaging of the abdomen and pelvis was performed following the standard protocol without intravenous contrast.  Comparison: 11/02/2011 radiograph, 08/31/2011 CT  Findings: Cardiomegaly.  Low attenuation of the blood pool suggests anemia.  Aortic valve, mitral annular, and coronary artery calcifications.  Bilateral pleural effusions with associated compressive atelectasis.  Trace pericardial effusion.  Organ abnormality/lesion detection is limited in the absence of intravenous contrast. Within this limitation, unremarkable liver, biliary system, spleen, pancreas, adrenal glands.  Lobular renal contours.  No hydronephrosis or hydroureter.  Circumferential rectosigmoid wall thickening with mild pericolonic fat stranding.  Normal appendix.  No free intraperitoneal air. Small amount of free fluid.  No lymphadenopathy. Small duodenal diverticulum.  Advanced atherosclerosis of the aorta and branch vessels.  Further vascular evaluation is not possible in the absence of intravenous contrast.  Circumaortic left renal vein.  Decompressed bladder.  Nonspecific CT appearance to the uterus and adnexa.  Multilevel degenerative changes.  No acute osseous finding.  IMPRESSION: Circumferential rectosigmoid colonic wall thickening with mild pericolonic fat stranding. In keeping with a nonspecific colitis; infectious, inflammatory, and ischemic considerations. Pseudomembranous colitis also on the differential. The more proximal colon has a normal appearance.  Small amount of free fluid is nonspecific.  Small bilateral pleural effusions with associated atelectasis.  Cardiomegaly and trace pericardial fluid.  Coronary artery calcification.   Original Report Authenticated By: Waneta Martins, M.D.    Dg Chest 2 View  10/31/2011  *RADIOLOGY REPORT*  Clinical Data: Cough and congestion  CHEST - 2 VIEW  Comparison: 10/03/2011   Findings: The cardiac shadow is again mildly enlarged.  Previously seen right-sided PICC line has been removed in the interval.  The lungs are well-aerated bilaterally with evidence of right basilar infiltrate and likely small effusion.  IMPRESSION: Right basilar infiltrate.   Original Report Authenticated By: Phillips Odor, M.D.    Dg Abd Acute W/chest  11/02/2011  *RADIOLOGY REPORT*  Clinical Data: Abdominal pain.  Recent surgery.  Nausea.  ACUTE ABDOMEN SERIES (ABDOMEN 2 VIEW & CHEST 1 VIEW)  Comparison: Two-view chest 10/31/2011.  CT abdomen pelvis 08/31/2011.  Findings: The heart is enlarged.  Bilateral pleural effusions are worse right than left.  Bibasilar airspace disease persists.  Supine and upright views the abdomen demonstrate a nonspecific bowel gas pattern.  There is no evidence for obstruction or free air.  Rightward scoliosis of the lumbar spine is stable.  IMPRESSION:  1.  Cardiomegaly and bilateral pleural effusions. 2.  Mild bibasilar airspace disease likely reflects atelectasis. 3.  No acute abnormality of the abdomen. 4.  Scoliosis.   Original Report Authenticated By: Jamesetta Orleans. MATTERN, M.D.     Scheduled Meds:    . sodium chloride   Intravenous Once  . allopurinol  100 mg Oral Daily  . amiodarone  200 mg Oral BID  . atorvastatin  40 mg Oral Daily  . colesevelam  1,875 mg Oral BID WC  . diltiazem  240 mg Oral Daily  . famotidine  20 mg Oral BID  . Fluticasone-Salmeterol  1 puff Inhalation BID  . metronidazole  500 mg Intravenous Once  . metronidazole  500 mg Intravenous Q8H  . raloxifene  60 mg Oral Daily  . Rivaroxaban  15 mg Oral Q breakfast  . sodium chloride  3 mL Intravenous Q12H  . DISCONTD:  HYDROmorphone (DILAUDID) injection  1 mg Intravenous Once  . DISCONTD: ondansetron (ZOFRAN) IV  4 mg Intravenous Once  . DISCONTD: pantoprazole  40 mg Oral Q1200  . DISCONTD: Rivaroxaban  20 mg Oral Daily   Continuous Infusions:    . sodium chloride 50 mL/hr at  11/03/11 0437    Time spent: 20 minutes.    [facility]  [facility] Pager [phone].   If 8PM-8AM, please contact night-coverage at [email or web address], password [facility] 11/03/2011, 6:55 PM  LOS: 1 day

## 2011-11-03 NOTE — Evaluation (Signed)
Physical Therapy Evaluation Patient Details Name: [name] [name] MRN: [id number] DOB: [birth year] Today's Date: 11/03/2011 Time: 1308-6578 PT Time Calculation (min): 21 min  PT Assessment / Plan / Recommendation Clinical Impression  78 yo female admitted with abd pain, n/v, diarrhea. Pt states she was living in her apartment before this admisssion. She thinks she will d/c to her daughter's home for a few days after this hospital stay. Recommend HHPT.     PT Assessment  Patient needs continued PT services    Follow Up Recommendations  Home health PT;Supervision for mobility/OOB initially   Barriers to Discharge        Equipment Recommendations  None recommended by PT    Recommendations for Other Services     Frequency Min 3X/week    Precautions / Restrictions Precautions Precautions: Fall Restrictions Weight Bearing Restrictions: No   Pertinent Vitals/Pain       Mobility  Bed Mobility Bed Mobility: Not assessed Transfers Transfers: Sit to Stand;Stand to Sit Sit to Stand: 4: Min guard;With upper extremity assist;From chair/3-in-1;With armrests Stand to Sit: 4: Min guard;With upper extremity assist;To chair/3-in-1;With armrests Details for Transfer Assistance: VCs safety, technique, hand placement.  Ambulation/Gait Ambulation/Gait Assistance: 4: Min guard Ambulation Distance (Feet): 150 Feet Assistive device: Rolling walker Ambulation/Gait Assistance Details: VCs safety. Good gait speed.  Gait Pattern: Step-through pattern    Exercises     PT Diagnosis: Difficulty walking  PT Problem List: Decreased strength;Decreased balance;Decreased mobility PT Treatment Interventions: DME instruction;Gait training;Functional mobility training;Therapeutic activities;Therapeutic exercise;Patient/family education   PT Goals Acute Rehab PT Goals PT Goal Formulation: With patient Time For Goal Achievement: 11/10/11 Potential to Achieve Goals: Good Pt will go Supine/Side to Sit:  with modified independence PT Goal: Supine/Side to Sit - Progress: Goal set today Pt will go Sit to Supine/Side: with modified independence PT Goal: Sit to Supine/Side - Progress: Goal set today Pt will go Sit to Stand: with supervision PT Goal: Sit to Stand - Progress: Goal set today Pt will Ambulate: 51 - 150 feet;with supervision;with least restrictive assistive device PT Goal: Ambulate - Progress: Goal set today  Visit Information  Last PT Received On: 11/03/11 Assistance Needed: +1    Subjective Data  Subjective: "I think I'm going back to my daughter's, but I wanna go to my apt" Patient Stated Goal: Home   Prior Functioning  Home Living Lives With: Alone Available Help at Discharge: Family Type of Home: Apartment Home Access: Level entry Home Layout: One level Bathroom Shower/Tub: Health visitor: Standard Home Adaptive Equipment: Built-in shower seat;Walker - rolling;Walker - four wheeled;Bedside commode/3-in-1;Straight cane Additional Comments: However, pt is planning to d/c to daughter's home Prior Function Level of Independence: Independent with assistive device(s) Able to Take Stairs?: Yes Vocation: Retired Musician: No difficulties    Cognition  Overall Cognitive Status: Appears within functional limits for tasks assessed/performed Arousal/Alertness: Awake/alert Orientation Level: Appears intact for tasks assessed Behavior During Session: [facility] for tasks performed    Extremity/Trunk Assessment Right Lower Extremity Assessment RLE ROM/Strength/Tone: [facility] for tasks assessed Left Lower Extremity Assessment LLE ROM/Strength/Tone: [facility] for tasks assessed   Balance    End of Session PT - End of Session Equipment Utilized During Treatment: Gait belt Activity Tolerance: Patient tolerated treatment well Patient left: in chair;with call bell/phone within reach  GP     Rebeca Alert [facility] 11/03/2011, 2:56 PM [phone]

## 2011-11-04 DIAGNOSIS — N179 Acute kidney failure, unspecified: Secondary | ICD-10-CM

## 2011-11-04 LAB — CBC
HCT: 23.9 % — ABNORMAL LOW (ref 36.0–46.0)
Hemoglobin: 7.6 g/dL — ABNORMAL LOW (ref 12.0–15.0)
MCH: 30 pg (ref 26.0–34.0)
MCHC: 31.8 g/dL (ref 30.0–36.0)
RDW: 14.4 % (ref 11.5–15.5)

## 2011-11-04 LAB — BASIC METABOLIC PANEL
BUN: 19 mg/dL (ref 6–23)
Calcium: 8.5 mg/dL (ref 8.4–10.5)
GFR calc Af Amer: 44 mL/min — ABNORMAL LOW (ref >90)
GFR calc non Af Amer: 38 mL/min — ABNORMAL LOW (ref >90)
Glucose, Bld: 116 mg/dL — ABNORMAL HIGH (ref 70–99)
Potassium: 3.5 mEq/L (ref 3.5–5.1)
Sodium: 136 mEq/L (ref 135–145)

## 2011-11-04 LAB — PREPARE RBC (CROSSMATCH)

## 2011-11-04 MED ORDER — SACCHAROMYCES BOULARDII 250 MG PO CAPS: 250.0000 mg | ORAL_CAPSULE | Freq: Two times a day (BID) | ORAL | Status: AC

## 2011-11-04 MED ORDER — VANCOMYCIN 50 MG/ML ORAL SOLUTION
125.0000 mg | Freq: Four times a day (QID) | ORAL | Status: DC
  Administered 2011-11-04: 125 mg via ORAL
  Filled 2011-11-04 (×4): qty 2.5

## 2011-11-04 MED ORDER — VANCOMYCIN 50 MG/ML ORAL SOLUTION: 125.0000 mg | Freq: Four times a day (QID) | ORAL | Status: DC

## 2011-11-04 MED ORDER — FIDAXOMICIN 200 MG PO TABS: 200.0000 mg | ORAL_TABLET | Freq: Two times a day (BID) | ORAL | Status: DC

## 2011-11-04 NOTE — Progress Notes (Signed)
Prescription faxed to [facility] [address]; the prescription was later faxed to [facility] [phone]- [address]) by [facility]. [facility] has filled the prescription for vancomycin and the copay is $5.00. Information given to the patient and daughter - they will go by [facility] and pick up the medication at discharge.

## 2011-11-04 NOTE — Progress Notes (Signed)
ANTIBIOTIC CONSULT NOTE - INITIAL  Pharmacy Consult for Clostridium difficile treatment  Allergies  Allergen Reactions  . Baby Powder (Methylbenzethonium) Shortness Of Breath  . Demerol (Meperidine) Hives  . Penicillins Hives    Patient Measurements: Height: 5\' 1"  (154.9 cm) Weight: 173 lb 11.6 oz (78.8 kg) IBW/kg (Calculated) : 47.8   Vital Signs: Temp: 98.1 F (36.7 C) (08/30 0604) Temp src: Oral (08/30 0604) BP: 105/58 mmHg (08/30 0604) Pulse Rate: 90  (08/30 0604) Intake/Output from previous day: 08/29 0701 - 08/30 0700 In: 1080 [P.O.:480; I.V.:400; IV Piggyback:200] Out: 451 [Urine:450; Stool:1] Intake/Output from this shift:    Labs:  Basename 11/04/11 0435 11/03/11 0430 11/02/11 1929  WBC 8.8 13.5* 17.2*  HGB 7.6* 8.1* 8.9*  PLT 175 163 213  LABCREA -- -- --  CREATININE 1.24* 1.18* 1.20*   Estimated Creatinine Clearance: 30.9 ml/min (by C-G formula based on Cr of 1.24).   Microbiology: Recent Results (from the past 720 hour(s))  CLOSTRIDIUM DIFFICILE BY PCR     Status: Abnormal   Collection Time   11/03/11  1:35 AM      Component Value Range Status Comment   C difficile by pcr POSITIVE (*) NEGATIVE Final     Medical History: Past Medical History  Diagnosis Date  . Hypertension   . Hyperlipidemia   . COPD (chronic obstructive pulmonary disease)   . Osteoporosis   . Arthritis   . H/O hiatal hernia   . Chronic back pain   . CAD (coronary artery disease)     Cath 2005 with moderate CAD, Dr. [place]  . Atrial fibrillation   . C. difficile diarrhea     Assessment:  69 YOF with her first recurrent episode of C.diff (recurrent within 8 weeks, first episode in July).  Patient had received clindamycin during July admission, increasing risk of cdiff.  Upon admission, patient's WBC was elevated (>15) and SCr is mildly elevated from baseline as well so will treat as severe 1st recurrent episode.  Patient has received 3 doses of IV Flagyl 8/29.  Protonix  has already been switched to Pepcid.  Goal of Therapy:  eradication of infection  Plan:  Begin vancomycin oral solution 125 mg q6h x 14 days.  Will d/c IV Flagyl. D/c Welchol.  Per cdiff protocol, bile acid sequestrants should be held during cdiff treatment.  Clance Boll 11/04/2011,9:24 AM

## 2011-11-04 NOTE — Progress Notes (Signed)
Patient to discharge home possibly on oral vancomycin 125 mg q6hrs; Talked to patient with daughter present, patient has private insurance [employer] and [employer] as secondary insurance; The [facility] [address] [phone] talked to Cameron[facility] pharmacist and was informed that they do have the medication in stock. The pharmacist was unable to quote a cost and stated that when the prescription is faxed over, she could tell me how much it would cost the patient. Cameron/pharmacist stated " its a very expensive medication".

## 2011-11-04 NOTE — Discharge Summary (Signed)
Physician Discharge Summary  [name] [name] ZOX:[id number] DOB: 01/09/1924 DOA: 11/02/2011  PCP: [name], MD  Admit date: 11/02/2011 Discharge date: 11/04/2011  Recommendations for Outpatient Follow-up:  Home health RN and PT. 2. Follow up with PCP in 1 week. Needs H&h and  renal function checked . Resume lasix if renal function improved.   Discharge Diagnoses:  Principal Problem:  *C. difficile colitis ( 1st relapse)   Active Problems:  Atrial fibrillation  Anemia due to chronic illness  Acute kidney injury  Hypotension  Hyperlipidemia  Leukocytosis  Diarrhea  Hypokalemia   Discharge Condition: fair  Diet recommendation: cardiac  Filed Weights   11/03/11 0229 11/03/11 0500 11/04/11 0604  Weight: 76.4 kg (168 lb 6.9 oz) 77.4 kg (170 lb 10.2 oz) 78.8 kg (173 lb 11.6 oz)    History of present illness:  78 year old female with history of recently diagnosed atrial fibrillation (on xarelto), COPD, HTN, dyslipidemia who presented to ED with intractable diarrhea ever since initiating antibiotics for bronchitis (omnicef). Patient reports lower abdominal pain, 6/10 in intensity, mostly on left side, non radiating. She has not taken any pain relieving medication but reports diarrhea is aggravating the pain. No reports of blood in stool or urine, no chest pain, no palpitations, no shortness of breath, no cough. No lightheadedness or dizziness or loss of consciousness.   Hospital Course:  1. C. difficile colitis: This is her second episode. Her last episode was in [place] and treated with a course of  flagyl . Daughter informs she completed the course of antibiotics. Avoid antibiotics and PPIs in the future unless absolutely indicated. Patient was started on flagyl but given recurrence i have switched her to oral vancomycin for a 14 day course.  Initially planned for discharge on dificid for a 10 day course, unfortunately pharmacies that were contacted do not have it in stock. Her diarrhea  has now resolved and she is stable for discharge home. 2.  Hypotension: BP low normal at times, but asymptomatic. 3. Hypokalemia: Replete and follow BMP in a.m. 4. Anemia: has anemia of chr disease, hb usually 8.5-10. Noted for drop this am to 7.6. asymptomatic. Will transfuse with 1 U PRBC prior to discharge. recheck H&H as outpt in 1 week. 5. Atrial fibrillation: Rate controlled. Continue Cardizem, Xarelto and amiodarone. 6. AKI on Chronic kidney disease: mild with creatinine of 1.24 this am. possibly with c diff and mid dehydration. Will hold lasix until repeat labs checked in 1 week and lasix can be resumed if creatinine stable.    Procedures:  none  Consultations:  none  Discharge Exam: Filed Vitals:   11/04/11 0604  BP: 105/58  Pulse: 90  Temp: 98.1 F (36.7 C)  Resp: 18   Filed Vitals:   11/03/11 2048 11/03/11 2051 11/04/11 0604 11/04/11 0900  BP:  124/61 105/58   Pulse:  113 90   Temp:  98 F (36.7 C) 98.1 F (36.7 C)   TempSrc:  Oral Oral   Resp:  22 18   Height:      Weight:   78.8 kg (173 lb 11.6 oz)   SpO2: 98% 99% 96% 96%   General exam: Obese elderly female in NAD Respiratory system: Clear, no added sounds Cardiovascular system: S1 and S2 heard, irregularly irregular. No JVD, murmurs Gastrointestinal system: soft and nontender. No organomegaly or masses appreciated. Normal bowel sounds heard.  Central nervous system: Alert and oriented. No focal neurological deficits.  Extremities: warm, no edema  Discharge Instructions  Discharge Orders    Future Appointments: Provider: Department: Dept Phone: Center:   11/08/2011 4:15 PM Kathleene Hazel, MD [facility] [phone] [facility]     Medication List  As of 11/04/2011 10:19 AM   STOP taking these medications         cefdinir 300 MG capsule      furosemide 40 MG tablet colesevelam 625 MG tablet  Commonly known as: WELCHOL  1,875 mg by mouth 2 (two) times daily  ( holding while  patient on cdiff treatment)           TAKE these medications         allopurinol 100 MG tablet   Commonly known as: ZYLOPRIM   Take 100 mg by mouth daily.      amiodarone 200 MG tablet   Commonly known as: PACERONE   Take 200 mg by mouth 2 (two) times daily.      atorvastatin 40 MG tablet   Commonly known as: LIPITOR   Take 40 mg by mouth daily.       Oral vancomycin 125 mg every 6 hrs ( 2.5 ml per 125 mg) for 13 days            dexlansoprazole 60 MG capsule   Commonly known as: DEXILANT   Take 60 mg by mouth daily.      diltiazem 240 MG 24 hr capsule   Commonly known as: CARDIZEM CD   Take 1 capsule (240 mg total) by mouth daily.                  fish oil-omega-3 fatty acids 1000 MG capsule   Take 1 g by mouth 2 (two) times daily.      Fluticasone-Salmeterol 250-50 MCG/DOSE Aepb   Commonly known as: ADVAIR   Inhale 1 puff into the lungs 2 (two) times daily.      levalbuterol 45 MCG/ACT inhaler   Commonly known as: XOPENEX HFA   Inhale 1-2 puffs into the lungs every 4 (four) hours as needed for wheezing.      raloxifene 60 MG tablet   Commonly known as: EVISTA   Take 60 mg by mouth daily.      Rivaroxaban 20 MG Tabs   Commonly known as: XARELTO   Take 1 tablet (20 mg total) by mouth daily.      saccharomyces boulardii 250 MG capsule   Commonly known as: FLORASTOR   Take 1 capsule (250 mg total) by mouth 2 (two) times daily.           Follow-up Information    Follow up with [name], MD in 1 week.   Contact information:   [address] [place] [address] [phone]           The results of significant diagnostics from this hospitalization (including imaging, microbiology, ancillary and laboratory) are listed below for reference.    Significant Diagnostic Studies: Ct Abdomen Pelvis Wo Contrast  11/03/2011  *RADIOLOGY REPORT*  Clinical Data: Diarrhea, weakness.  CT ABDOMEN AND PELVIS WITHOUT CONTRAST  Technique:   Multidetector CT imaging of the abdomen and pelvis was performed following the standard protocol without intravenous contrast.  Comparison: 11/02/2011 radiograph, 08/31/2011 CT  Findings: Cardiomegaly.  Low attenuation of the blood pool suggests anemia.  Aortic valve, mitral annular, and coronary artery calcifications.  Bilateral pleural effusions with associated compressive atelectasis.  Trace pericardial effusion.  Organ abnormality/lesion detection is limited in the absence of  intravenous contrast. Within this limitation, unremarkable liver, biliary system, spleen, pancreas, adrenal glands.  Lobular renal contours.  No hydronephrosis or hydroureter.  Circumferential rectosigmoid wall thickening with mild pericolonic fat stranding.  Normal appendix.  No free intraperitoneal air. Small amount of free fluid.  No lymphadenopathy. Small duodenal diverticulum.  Advanced atherosclerosis of the aorta and branch vessels.  Further vascular evaluation is not possible in the absence of intravenous contrast.  Circumaortic left renal vein.  Decompressed bladder.  Nonspecific CT appearance to the uterus and adnexa.  Multilevel degenerative changes.  No acute osseous finding.  IMPRESSION: Circumferential rectosigmoid colonic wall thickening with mild pericolonic fat stranding. In keeping with a nonspecific colitis; infectious, inflammatory, and ischemic considerations. Pseudomembranous colitis also on the differential. The more proximal colon has a normal appearance.  Small amount of free fluid is nonspecific.  Small bilateral pleural effusions with associated atelectasis.  Cardiomegaly and trace pericardial fluid.  Coronary artery calcification.   Original Report Authenticated By: Waneta Martins, M.D.    Dg Chest 2 View  10/31/2011  *RADIOLOGY REPORT*  Clinical Data: Cough and congestion  CHEST - 2 VIEW  Comparison: 10/03/2011  Findings: The cardiac shadow is again mildly enlarged.  Previously seen right-sided PICC line  has been removed in the interval.  The lungs are well-aerated bilaterally with evidence of right basilar infiltrate and likely small effusion.  IMPRESSION: Right basilar infiltrate.   Original Report Authenticated By: Phillips Odor, M.D.    Dg Abd Acute W/chest  11/02/2011  *RADIOLOGY REPORT*  Clinical Data: Abdominal pain.  Recent surgery.  Nausea.  ACUTE ABDOMEN SERIES (ABDOMEN 2 VIEW & CHEST 1 VIEW)  Comparison: Two-view chest 10/31/2011.  CT abdomen pelvis 08/31/2011.  Findings: The heart is enlarged.  Bilateral pleural effusions are worse right than left.  Bibasilar airspace disease persists.  Supine and upright views the abdomen demonstrate a nonspecific bowel gas pattern.  There is no evidence for obstruction or free air.  Rightward scoliosis of the lumbar spine is stable.  IMPRESSION:  1.  Cardiomegaly and bilateral pleural effusions. 2.  Mild bibasilar airspace disease likely reflects atelectasis. 3.  No acute abnormality of the abdomen. 4.  Scoliosis.   Original Report Authenticated By: Jamesetta Orleans. MATTERN, M.D.     Microbiology: Recent Results (from the past 240 hour(s))  CLOSTRIDIUM DIFFICILE BY PCR     Status: Abnormal   Collection Time   11/03/11  1:35 AM      Component Value Range Status Comment   C difficile by pcr POSITIVE (*) NEGATIVE Final      Labs: Basic Metabolic Panel:  Lab 11/04/11 7829 11/03/11 0430 11/02/11 1929  NA 136 135 134*  K 3.5 3.3* 3.9  CL 106 104 102  CO2 22 24 23   GLUCOSE 116* 90 119*  BUN 19 14 14   CREATININE 1.24* 1.18* 1.20*  CALCIUM 8.5 8.3* 8.7  MG -- -- --  PHOS -- -- --   Liver Function Tests:  Lab 11/03/11 0430 11/02/11 1929  AST 10 11  ALT 8 9  ALKPHOS 69 69  BILITOT 0.6 0.8  PROT 5.5* 6.3  ALBUMIN 2.4* 2.8*   No results found for this basename: LIPASE:5,AMYLASE:5 in the last 168 hours No results found for this basename: AMMONIA:5 in the last 168 hours CBC:  Lab 11/04/11 0435 11/03/11 0430 11/02/11 1929  WBC 8.8 13.5* 17.2*   NEUTROABS -- -- 15.4*  HGB 7.6* 8.1* 8.9*  HCT 23.9* 25.1* 27.7*  MCV 94.5  94.4 94.5  PLT 175 163 213   Cardiac Enzymes: No results found for this basename: CKTOTAL:5,CKMB:5,CKMBINDEX:5,TROPONINI:5 in the last 168 hours BNP: BNP (last 3 results)  Basename 10/01/11 0718 09/29/11 0008 09/28/11 2200  PROBNP 1563.0* 2283.0* 2255.0*   CBG:  Lab 11/04/11 0804 11/03/11 0750  GLUCAP 100* 82    Time coordinating discharge: 40  minutes  Signed:  Eddie North  [facility] 11/04/2011, 10:19 AM

## 2011-11-07 LAB — STOOL CULTURE

## 2011-11-08 ENCOUNTER — Ambulatory Visit (INDEPENDENT_AMBULATORY_CARE_PROVIDER_SITE_OTHER): Payer: Medicare Other | Admitting: Cardiovascular Disease

## 2011-11-08 ENCOUNTER — Encounter: Payer: Self-pay | Admitting: Cardiovascular Disease

## 2011-11-08 VITALS — BP 136/74 | HR 60 | Ht 61.0 in | Wt 176.0 lb

## 2011-11-08 DIAGNOSIS — I509 Heart failure, unspecified: Secondary | ICD-10-CM

## 2011-11-08 DIAGNOSIS — I25810 Atherosclerosis of coronary artery bypass graft(s) without angina pectoris: Secondary | ICD-10-CM

## 2011-11-08 DIAGNOSIS — D638 Anemia in other chronic diseases classified elsewhere: Secondary | ICD-10-CM

## 2011-11-08 DIAGNOSIS — I4891 Unspecified atrial fibrillation: Secondary | ICD-10-CM

## 2011-11-08 DIAGNOSIS — I5032 Chronic diastolic (congestive) heart failure: Secondary | ICD-10-CM

## 2011-11-08 LAB — TYPE AND SCREEN
Donor AG Type: NEGATIVE
Unit division: 0
Unit division: 0

## 2011-11-08 MED ORDER — RIVAROXABAN 20 MG PO TABS: 20.0000 mg | ORAL_TABLET | Freq: Every day | ORAL | Status: DC

## 2011-11-08 NOTE — Patient Instructions (Signed)
Your physician recommends that you schedule a follow-up appointment in: 3 weeks--November 30, 2011 at 2:30

## 2011-11-08 NOTE — Progress Notes (Signed)
History of Present Illness: 78 yo WF with history of non-obstructive CAD, atrial fibrillation,  HTN, HLD, COPD, carotid artery stenosis s/p bilateral CEA here today for cardiac follow up. Last cath September 2005 with proximal LAD less than 20%, D1 20-30%, ostial RCA 40-50%, proximal RCA 20%, EF 60%.  She was admitted 7/24-8/1 with dyspnea and noted to have atrial fibrillation with RVR. V/Q scan was negative for pulmonary embolism. Of note, she had recently been discharged after a prolonged hospitalization in 6/13 for Salmonella enteritis with septic shock. Xarelto was started for anticoagulation. She had signs of volume overload and was treated with IV Lasix. Echocardiogram 09/29/11: Mild LVH, EF 45-50%, diffuse HK, mild to moderate aortic stenosis, mean gradient 12 mmHg, moderate MAC, mild MR, moderate LAE, moderate RAE, question small secundum ASD with left to right flow not seen in 4 chamber view, PASP 35-39 (mild pulmonary hypertension). She was placed on amiodarone and underwent TEE-DCCV. However, she reverted back to atrial fibrillation. Amiodarone was continued. It was felt that she could undergo repeat DCCV after fully loaded with amiodarone. TEE on 10/04/11: Mild LVH, EF 55%, mild MR, no defect or PFO-echo contrast study showed no right-to-left atrial level shunt. She saw Tereso Newcomer, PA-C in our office 10/14/11 and was feeling weak. Anemia was stable and BMET was ok. She developed diarrhea and was readmitted 8/28/013-11/04/11 and found to have recurrent C. Difficile infection which has been treated with Flagyl. Her diarrhea resolved before discharge but her anemia worsened and she was given 1 unit pRBC.   She is here today for follow up.  No syncope or near syncope. No chest pain. No dyspnea. No orthopnea, PND. She has LE edema. She still feels weak. Diarrhea still present but improving. Cough still present but improving.   Primary Care Physician: [name]  Past Medical History  Diagnosis Date   . Hypertension   . Hyperlipidemia   . COPD (chronic obstructive pulmonary disease)   . Osteoporosis   . Arthritis   . H/O hiatal hernia   . Chronic back pain   . CAD (coronary artery disease)     Cath 2005 with moderate CAD, Dr. [place]  . Atrial fibrillation   . C. difficile diarrhea     Past Surgical History  Procedure Date  . Carotid endarterectomy     bilateral  . Cataract extraction, bilateral   . Joint replacement 2008    Right Total Knee  . Anal fistula repair   . Left parotidectomy   . Eye surgery   . Flexible sigmoidoscopy 09/02/2011    Procedure: FLEXIBLE SIGMOIDOSCOPY;  Surgeon: Theda Belfast, MD;  Location: [facility] ENDOSCOPY;  Service: Endoscopy;  Laterality: N/A;  . Flexible sigmoidoscopy 09/03/2011    Procedure: FLEXIBLE SIGMOIDOSCOPY;  Surgeon: Theda Belfast, MD;  Location: [facility] ENDOSCOPY;  Service: Endoscopy;  Laterality: N/A;  . Tee without cardioversion 10/04/2011    Procedure: TRANSESOPHAGEAL ECHOCARDIOGRAM (TEE);  Surgeon: Laurey Morale, MD;  Location: [facility] ENDOSCOPY;  Service: Cardiovascular;  Laterality: N/A;  to be carelinked here by 1230-verified 7/29/dl  . Cardioversion 10/04/2011    Procedure: CARDIOVERSION;  Surgeon: Laurey Morale, MD;  Location: [facility] ENDOSCOPY;  Service: Cardiovascular;  Laterality: N/A;    Current Outpatient Prescriptions  Medication Sig Dispense Refill  . allopurinol (ZYLOPRIM) 100 MG tablet Take 100 mg by mouth daily.       Marland Kitchen amiodarone (PACERONE) 200 MG tablet Take 200 mg by mouth 2 (two) times daily.      Marland Kitchen  atorvastatin (LIPITOR) 40 MG tablet Take 40 mg by mouth daily.      Marland Kitchen diltiazem (CARDIZEM CD) 240 MG 24 hr capsule Take 1 capsule (240 mg total) by mouth daily.  30 capsule  0  . fish oil-omega-3 fatty acids 1000 MG capsule Take 1 g by mouth 2 (two) times daily.       . Fluticasone-Salmeterol (ADVAIR) 250-50 MCG/DOSE AEPB Inhale 1 puff into the lungs 2 (two) times daily.  60 each  0  . levalbuterol (XOPENEX HFA) 45 MCG/ACT inhaler  Inhale 1-2 puffs into the lungs every 4 (four) hours as needed for wheezing.  1 Inhaler  12  . raloxifene (EVISTA) 60 MG tablet Take 60 mg by mouth daily.      . Rivaroxaban (XARELTO) 20 MG TABS Take 1 tablet (20 mg total) by mouth daily.  30 tablet  0  . saccharomyces boulardii (FLORASTOR) 250 MG capsule Take 1 capsule (250 mg total) by mouth 2 (two) times daily.  20 capsule  0  . vancomycin (VANCOCIN) 50 mg/mL oral solution Take 2.5 mLs (125 mg total) by mouth every 6 (six) hours.  130 mL  0    Allergies  Allergen Reactions  . Baby Powder (Methylbenzethonium) Shortness Of Breath  . Demerol (Meperidine) Hives  . Penicillins Hives    History   Social History  . Marital Status: Widowed    Spouse Name: N/A    Number of Children: N/A  . Years of Education: N/A   Occupational History  . Not on file.   Social History Main Topics  . Smoking status: Former Smoker -- 1.0 packs/day for 20 years    Types: Cigarettes    Quit date: 09/01/1983  . Smokeless tobacco: Never Used  . Alcohol Use: No  . Drug Use: No  . Sexually Active: No   Other Topics Concern  . Not on file   Social History Narrative  . No narrative on file    No family history on file.  Review of Systems:  As stated in the HPI and otherwise negative.   BP 136/74  Pulse 60  Ht 5\' 1"  (1.549 m)  Wt 176 lb (79.833 kg)  BMI 33.25 kg/m2  SpO2 89%  Physical Examination: General: Well developed, well nourished, NAD HEENT: OP clear, mucus membranes moist SKIN: warm, dry. No rashes. Neuro: No focal deficits Musculoskeletal: Muscle strength 5/5 all ext Psychiatric: Mood and affect normal Neck: No JVD, no carotid bruits, no thyromegaly, no lymphadenopathy. Lungs:Clear bilaterally, no wheezes, rhonci, crackles Cardiovascular: Irregular rate and rhythm. No murmurs, gallops or rubs. Abdomen:Soft. Bowel sounds present. Non-tender.  Extremities: Trace bilateral lower extremity edema. Pulses are 2 + in the bilateral  DP/PT.  EKG: Atrial fibrillation, rate 96 bpm. PVCs. Possible septal infarct. T wave abormalities.   Echo 09/29/11: Left ventricle: The cavity size was normal. Wall thickness was increased in a pattern of mild LVH. Systolic function was mildly reduced. The estimated ejection fraction was in the range of 45% to 50%. Diffuse hypokinesis. - Aortic valve: Trileaflet; severely calcified leaflets. Mild to moderate aortic stenosis. Mild by gradient, moderate by valve area. Mean gradient: 12mm Hg (S). Peak gradient: 20mm Hg (S). Valve area: 1.29cm^2(VTI). - Mitral valve: Moderately calcified annulus. Mild regurgitation. - Left atrium: The atrium was moderately dilated. - Right ventricle: The cavity size was normal. Systolic function was normal. - Right atrium: The atrium was moderately dilated. - Atrial septum: The subcostal images raise concern for a small secundum ASD  with left to right flow. This is not seen in the four chamber views. - Tricuspid valve: Peak RV-RA gradient: 29mm Hg (S). - Pulmonary arteries: PA systolic pressure 35-39 mmHg. - Systemic veins: IVC measured 2.0 cm with normal respirophasic variation, suggesting RA pressure 6-10 mmHg. Impressions:  - Normal LV size with mild LV hypertrophy. EF 45-50% with mild global hypokinesis. Mild to moderate AS. Normal RV size and systolic function. Mild pulmonary hypertension. The subcostal images raise concern for a small secundum ASD. Would consider TEE to confirm or refute.  (TEE did not show this)   Assessment and Plan:   1. Atrial Fibrillation: Rate is well controlled. She is on amiodarone 200 mg po BID and Cardizem.  She remains on Xarelto. No bleeding issues. I agree that DCCV is indicated but will delay until she is over her acute issues with dehydration and diarrhea. I will see her back in 3 weeks and will plan DCCV then if she is still in atrial fibrillation.   2. Chronic Diastolic CHF: Lasix was held in the hospital.  She is having some LE edema. Will check BMET today and if renal function stable, will restart Lasix at previous dose.   3. Hypertension: BP well controlled.   4. CAD: No angina. Continue Lipitor. She is not on aspirin as she is on Xarelto.  5. Anemia: Felt to be related to chronic disease. Transfused in hospital. Will check CBC today.

## 2011-11-09 ENCOUNTER — Other Ambulatory Visit: Payer: Medicare Other

## 2011-11-10 LAB — CBC WITH DIFFERENTIAL/PLATELET
Basophils Absolute: 0 10*3/uL (ref 0.0–0.1)
Basophils Relative: 0.6 % (ref 0.0–3.0)
Eosinophils Absolute: 0.2 10*3/uL (ref 0.0–0.7)
HCT: 30.3 % — ABNORMAL LOW (ref 36.0–46.0)
Hemoglobin: 9.5 g/dL — ABNORMAL LOW (ref 12.0–15.0)
Lymphocytes Relative: 15.4 % (ref 12.0–46.0)
Lymphs Abs: 1.3 10*3/uL (ref 0.7–4.0)
MCHC: 31.5 g/dL (ref 30.0–36.0)
MCV: 93.3 fl (ref 78.0–100.0)
Monocytes Absolute: 0.3 10*3/uL (ref 0.1–1.0)
Neutro Abs: 6.4 10*3/uL (ref 1.4–7.7)
RBC: 3.25 Mil/uL — ABNORMAL LOW (ref 3.87–5.11)
RDW: 16 % — ABNORMAL HIGH (ref 11.5–14.6)

## 2011-11-10 LAB — BASIC METABOLIC PANEL
CO2: 27 mEq/L (ref 19–32)
Calcium: 9 mg/dL (ref 8.4–10.5)
Chloride: 106 mEq/L (ref 96–112)
Glucose, Bld: 91 mg/dL (ref 70–99)
Sodium: 140 mEq/L (ref 135–145)

## 2011-11-17 ENCOUNTER — Telehealth: Payer: Self-pay | Admitting: Cardiovascular Disease

## 2011-11-17 ENCOUNTER — Other Ambulatory Visit: Payer: Medicare Other

## 2011-11-17 ENCOUNTER — Encounter: Payer: Self-pay | Admitting: Vascular Surgery

## 2011-11-17 DIAGNOSIS — I5032 Chronic diastolic (congestive) heart failure: Secondary | ICD-10-CM

## 2011-11-17 NOTE — Telephone Encounter (Signed)
Pt daughter returning nurse call , she can be reached at [phone].  She can be reached at 3:00pm

## 2011-11-17 NOTE — Telephone Encounter (Signed)
pt's dtr rtn call [phone] can be reached until 3p or after 4p, pls call

## 2011-11-17 NOTE — Telephone Encounter (Signed)
Spoke with pt's daughter and reviewed instructions from Dr. Clifton James. Pt will come in for BMP later today or early tomorrow.

## 2011-11-18 ENCOUNTER — Other Ambulatory Visit: Payer: Self-pay | Admitting: *Deleted

## 2011-11-18 ENCOUNTER — Encounter (HOSPITAL_COMMUNITY): Payer: Self-pay | Admitting: Emergency Medicine

## 2011-11-18 ENCOUNTER — Emergency Department (HOSPITAL_COMMUNITY): Payer: Medicare Other

## 2011-11-18 ENCOUNTER — Inpatient Hospital Stay (HOSPITAL_COMMUNITY)
Admission: EM | Admit: 2011-11-18 | Discharge: 2011-11-21 | DRG: 194 | Disposition: A | Discharge status: Home Health | Payer: Medicare Other | Attending: Internal Medicine | Admitting: Internal Medicine

## 2011-11-18 ENCOUNTER — Other Ambulatory Visit (INDEPENDENT_AMBULATORY_CARE_PROVIDER_SITE_OTHER): Payer: Medicare Other

## 2011-11-18 DIAGNOSIS — J4489 Other specified chronic obstructive pulmonary disease: Secondary | ICD-10-CM | POA: Diagnosis present

## 2011-11-18 DIAGNOSIS — I4891 Unspecified atrial fibrillation: Secondary | ICD-10-CM | POA: Diagnosis present

## 2011-11-18 DIAGNOSIS — I5032 Chronic diastolic (congestive) heart failure: Secondary | ICD-10-CM

## 2011-11-18 DIAGNOSIS — J449 Chronic obstructive pulmonary disease, unspecified: Secondary | ICD-10-CM | POA: Diagnosis present

## 2011-11-18 DIAGNOSIS — I2510 Atherosclerotic heart disease of native coronary artery without angina pectoris: Secondary | ICD-10-CM | POA: Diagnosis present

## 2011-11-18 DIAGNOSIS — I509 Heart failure, unspecified: Secondary | ICD-10-CM

## 2011-11-18 DIAGNOSIS — D631 Anemia in chronic kidney disease: Secondary | ICD-10-CM | POA: Diagnosis present

## 2011-11-18 DIAGNOSIS — D638 Anemia in other chronic diseases classified elsewhere: Secondary | ICD-10-CM

## 2011-11-18 DIAGNOSIS — Y95 Nosocomial condition: Secondary | ICD-10-CM

## 2011-11-18 DIAGNOSIS — E785 Hyperlipidemia, unspecified: Secondary | ICD-10-CM | POA: Diagnosis present

## 2011-11-18 DIAGNOSIS — A0472 Enterocolitis due to Clostridium difficile, not specified as recurrent: Secondary | ICD-10-CM

## 2011-11-18 DIAGNOSIS — J189 Pneumonia, unspecified organism: Principal | ICD-10-CM | POA: Diagnosis present

## 2011-11-18 DIAGNOSIS — N189 Chronic kidney disease, unspecified: Secondary | ICD-10-CM | POA: Diagnosis present

## 2011-11-18 DIAGNOSIS — N039 Chronic nephritic syndrome with unspecified morphologic changes: Secondary | ICD-10-CM | POA: Diagnosis present

## 2011-11-18 DIAGNOSIS — I129 Hypertensive chronic kidney disease with stage 1 through stage 4 chronic kidney disease, or unspecified chronic kidney disease: Secondary | ICD-10-CM | POA: Diagnosis present

## 2011-11-18 DIAGNOSIS — I959 Hypotension, unspecified: Secondary | ICD-10-CM

## 2011-11-18 HISTORY — DX: Gout, unspecified: M10.9

## 2011-11-18 LAB — CBC WITH DIFFERENTIAL/PLATELET
Basophils Absolute: 0.1 10*3/uL (ref 0.0–0.1)
Eosinophils Relative: 1 % (ref 0–5)
HCT: 29.9 % — ABNORMAL LOW (ref 36.0–46.0)
Lymphocytes Relative: 16 % (ref 12–46)
Lymphs Abs: 1.2 10*3/uL (ref 0.7–4.0)
MCV: 93.4 fL (ref 78.0–100.0)
Neutro Abs: 5.4 10*3/uL (ref 1.7–7.7)
Platelets: 281 10*3/uL (ref 150–400)
RBC: 3.2 MIL/uL — ABNORMAL LOW (ref 3.87–5.11)
WBC: 7.3 10*3/uL (ref 4.0–10.5)

## 2011-11-18 LAB — BASIC METABOLIC PANEL
BUN: 13 mg/dL (ref 6–23)
Calcium: 8.8 mg/dL (ref 8.4–10.5)
Creatinine, Ser: 1.4 mg/dL — ABNORMAL HIGH (ref 0.4–1.2)
GFR: 38.2 mL/min — ABNORMAL LOW (ref >60.00)

## 2011-11-18 LAB — URINALYSIS, ROUTINE W REFLEX MICROSCOPIC
Hgb urine dipstick: NEGATIVE
Ketones, ur: NEGATIVE mg/dL
Nitrite: NEGATIVE
Protein, ur: 30 mg/dL — AB
Urobilinogen, UA: 0.2 mg/dL (ref 0.0–1.0)

## 2011-11-18 LAB — COMPREHENSIVE METABOLIC PANEL
ALT: 11 U/L (ref 0–35)
AST: 14 U/L (ref 0–37)
Alkaline Phosphatase: 70 U/L (ref 39–117)
CO2: 30 mEq/L (ref 19–32)
Calcium: 9 mg/dL (ref 8.4–10.5)
Chloride: 99 mEq/L (ref 96–112)
GFR calc Af Amer: 40 mL/min — ABNORMAL LOW (ref >90)
GFR calc non Af Amer: 35 mL/min — ABNORMAL LOW (ref >90)
Glucose, Bld: 114 mg/dL — ABNORMAL HIGH (ref 70–99)
Potassium: 4 mEq/L (ref 3.5–5.1)
Sodium: 138 mEq/L (ref 135–145)
Total Bilirubin: 0.5 mg/dL (ref 0.3–1.2)

## 2011-11-18 LAB — URINE MICROSCOPIC-ADD ON

## 2011-11-18 MED ORDER — MOXIFLOXACIN HCL IN NACL 400 MG/250ML IV SOLN
400.0000 mg | Freq: Once | INTRAVENOUS | Status: AC
  Administered 2011-11-19: 400 mg via INTRAVENOUS
  Filled 2011-11-18: qty 250

## 2011-11-18 NOTE — ED Notes (Signed)
Patient was hospitalized for salmanilla poisoning, sepsis, renal failure, pneumonia, and C-Diff.  Patient developed A-fib two weeks later.  Patient has not felt better since she developed the pneumonia.  Patient's PCP sent her here to be treated for the pnx.  Patient has been on Vanc PO.

## 2011-11-18 NOTE — ED Notes (Signed)
Unable to draw blood off of IV start.

## 2011-11-18 NOTE — ED Provider Notes (Signed)
History     CSN: [id number]  Arrival date & time 11/18/11  1747   First MD Initiated Contact with Patient 11/18/11 2118      Chief Complaint  Patient presents with  . Pneumonia    (Consider location/radiation/quality/duration/timing/severity/associated sxs/prior treatment) HPI  Daughter reports patient was discharged from the hospital on August 30 with pneumonia. Patient relates her cough is better although sometimes she still coughs up yellow or sometimes bloody sputum. She denies fever. Family has noted she has some shortness of breath though she was talking or with walking. Patient states she's very weak and is free difficult for her to get around and she now has to use a walker if she walks. She denies any chest pain. Patient is still currently taking vancomycin for her C. difficile. She called her family doctor today and they told her to come to the ER to be admitted. Nurse reports her initial pulse ox when she put her in the room was 81-83% on room air. Patient is not on oxygen at home.  Patient was recently admitted after having salmonella poisoning that developed into sepsis, renal failure pneumonia and C. difficile. She also had an episode of atrial fibrillation.  PCP Dr Cindee Lame Cardiologist Dr Sanjuana Kava  Past Medical History  Diagnosis Date  . Hypertension   . Hyperlipidemia   . COPD (chronic obstructive pulmonary disease)   . Osteoporosis   . Arthritis   . H/O hiatal hernia   . Chronic back pain   . CAD (coronary artery disease)     Cath 2005 with moderate CAD, Dr. [place]  . Atrial fibrillation   . C. difficile diarrhea   . Gout     Past Surgical History  Procedure Date  . Carotid endarterectomy     bilateral  . Cataract extraction, bilateral   . Joint replacement 2008    Right Total Knee  . Anal fistula repair   . Left parotidectomy   . Eye surgery   . Flexible sigmoidoscopy 09/02/2011    Procedure: FLEXIBLE SIGMOIDOSCOPY;  Surgeon: Theda Belfast, MD;   Location: [facility] ENDOSCOPY;  Service: Endoscopy;  Laterality: N/A;  . Flexible sigmoidoscopy 09/03/2011    Procedure: FLEXIBLE SIGMOIDOSCOPY;  Surgeon: Theda Belfast, MD;  Location: [facility] ENDOSCOPY;  Service: Endoscopy;  Laterality: N/A;  . Tee without cardioversion 10/04/2011    Procedure: TRANSESOPHAGEAL ECHOCARDIOGRAM (TEE);  Surgeon: Laurey Morale, MD;  Location: [facility] ENDOSCOPY;  Service: Cardiovascular;  Laterality: N/A;  to be carelinked here by 1230-verified 7/29/dl  . Cardioversion 10/04/2011    Procedure: CARDIOVERSION;  Surgeon: Laurey Morale, MD;  Location: [facility] ENDOSCOPY;  Service: Cardiovascular;  Laterality: N/A;    Family History  Problem Relation Age of Onset  . Hemochromatosis Sister   . Diabetes type II Sister     History  Substance Use Topics  . Smoking status: Former Smoker -- 1.0 packs/day for 20 years    Types: Cigarettes    Quit date: 09/01/1983  . Smokeless tobacco: Never Used  . Alcohol Use: No  Lives at home Lives alone  OB History    Grav Para Term Preterm Abortions TAB SAB Ect Mult Living                  Review of Systems  All other systems reviewed and are negative.    Allergies  Baby powder; Demerol; and Penicillins  Home Medications   Current Outpatient Rx  Name Route Sig Dispense Refill  . ALLOPURINOL 100  MG PO TABS Oral Take 100 mg by mouth daily.     . AMIODARONE HCL 200 MG PO TABS Oral Take 200 mg by mouth 2 (two) times daily.    . ATORVASTATIN CALCIUM 40 MG PO TABS Oral Take 40 mg by mouth daily.    Marland Kitchen DILTIAZEM HCL ER COATED BEADS 240 MG PO CP24 Oral Take 1 capsule (240 mg total) by mouth daily. 30 capsule 0  . OMEGA-3 FATTY ACIDS 1000 MG PO CAPS Oral Take 1 g by mouth 2 (two) times daily.     Marland Kitchen FLUTICASONE-SALMETEROL 250-50 MCG/DOSE IN AEPB Inhalation Inhale 1 puff into the lungs 2 (two) times daily. 60 each 0  . LEVALBUTEROL TARTRATE 45 MCG/ACT IN AERO Inhalation Inhale 1-2 puffs into the lungs every 4 (four) hours as needed for wheezing.  1 Inhaler 12  . RALOXIFENE HCL 60 MG PO TABS Oral Take 60 mg by mouth daily.    Marland Kitchen RIVAROXABAN 20 MG PO TABS Oral Take 20 mg by mouth daily.    Marland Kitchen SACCHAROMYCES BOULARDII 250 MG PO CAPS Oral Take 250 mg by mouth 2 (two) times daily.    Marland Kitchen VANCOMYCIN 50 MG/ML ORAL SOLUTION Oral Take 125 mg by mouth every 6 (six) hours.      BP 125/66  Pulse 128  Temp 98.5 F (36.9 C) (Oral)  SpO2 92%  Vital signs normal except tachycardia and hypoxia   Physical Exam  Nursing note and vitals reviewed. Constitutional: She is oriented to person, place, and time. She appears well-developed and well-nourished.  Non-toxic appearance. She does not appear ill. No distress.  HENT:  Head: Normocephalic and atraumatic.  Right Ear: External ear normal.  Left Ear: External ear normal.  Nose: Nose normal. No mucosal edema or rhinorrhea.  Mouth/Throat: Oropharynx is clear and moist and mucous membranes are normal. No dental abscesses or uvula swelling.  Eyes: Conjunctivae normal and EOM are normal. Pupils are equal, round, and reactive to light.  Neck: Normal range of motion and full passive range of motion without pain. Neck supple.  Cardiovascular: Normal rate, regular rhythm and normal heart sounds.  Exam reveals no gallop and no friction rub.   No murmur heard. Pulmonary/Chest: Effort normal and breath sounds normal. No respiratory distress. She has no wheezes. She has no rhonchi. She has no rales. She exhibits no tenderness and no crepitus.  Abdominal: Soft. Normal appearance and bowel sounds are normal. She exhibits no distension. There is no tenderness. There is no rebound and no guarding.  Musculoskeletal: Normal range of motion. She exhibits no edema and no tenderness.       Moves all extremities well.   Neurological: She is alert and oriented to person, place, and time. She has normal strength. No cranial nerve deficit.  Skin: Skin is warm, dry and intact. No rash noted. No erythema. No pallor.    Psychiatric: She has a normal mood and affect. Her speech is normal and behavior is normal. Her mood appears not anxious.    ED Course  Procedures (including critical care time)   Medications  moxifloxacin (AVELOX) IVPB 400 mg (400 mg Intravenous Given 11/19/11 0009)     Review of prior chart shows patient had a right basilar infiltrate on a chest x-ray done August 26.  Pt turned over at change of shift to Dr Norlene Campbell, waiting for hospitalist to return call for admission  Results for orders placed during the hospital encounter of 11/18/11  CBC WITH DIFFERENTIAL  Component Value Range   WBC 7.3  4.0 - 10.5 K/uL   RBC 3.20 (*) 3.87 - 5.11 MIL/uL   Hemoglobin 9.5 (*) 12.0 - 15.0 g/dL   HCT 16.1 (*) 09.6 - 04.5 %   MCV 93.4  78.0 - 100.0 fL   MCH 29.7  26.0 - 34.0 pg   MCHC 31.8  30.0 - 36.0 g/dL   RDW 40.9  81.1 - 91.4 %   Platelets 281  150 - 400 K/uL   Neutrophils Relative 75  43 - 77 %   Neutro Abs 5.4  1.7 - 7.7 K/uL   Lymphocytes Relative 16  12 - 46 %   Lymphs Abs 1.2  0.7 - 4.0 K/uL   Monocytes Relative 8  3 - 12 %   Monocytes Absolute 0.6  0.1 - 1.0 K/uL   Eosinophils Relative 1  0 - 5 %   Eosinophils Absolute 0.1  0.0 - 0.7 K/uL   Basophils Relative 1  0 - 1 %   Basophils Absolute 0.1  0.0 - 0.1 K/uL  COMPREHENSIVE METABOLIC PANEL      Component Value Range   Sodium 138  135 - 145 mEq/L   Potassium 4.0  3.5 - 5.1 mEq/L   Chloride 99  96 - 112 mEq/L   CO2 30  19 - 32 mEq/L   Glucose, Bld 114 (*) 70 - 99 mg/dL   BUN 13  6 - 23 mg/dL   Creatinine, Ser 7.82 (*) 0.50 - 1.10 mg/dL   Calcium 9.0  8.4 - 95.6 mg/dL   Total Protein 6.3  6.0 - 8.3 g/dL   Albumin 3.0 (*) 3.5 - 5.2 g/dL   AST 14  0 - 37 U/L   ALT 11  0 - 35 U/L   Alkaline Phosphatase 70  39 - 117 U/L   Total Bilirubin 0.5  0.3 - 1.2 mg/dL   GFR calc non Af Amer 35 (*) >90 mL/min   GFR calc Af Amer 40 (*) >90 mL/min   Laboratory interpretation all normal except slightly improved renal insufficiency,  anemia that is stable   Dg Chest 2 View  11/18/2011  *RADIOLOGY REPORT*  Clinical Data: Cough.  Shortness of breath.  Chest tightness. History of hypertension and COPD.  CHEST - 2 VIEW  Comparison: Two-view chest x-ray 10/31/2011, 09/28/2011, 02/04/2009.  Findings: Cardiac silhouette moderately enlarged but stable. Pulmonary venous hypertension without overt edema.  Airspace consolidation in the right upper lobe.  Bilateral pleural effusions, right greater than left, and associated consolidation in the lower lobes.  Thoracic aorta atherosclerotic, unchanged.  Hilar and mediastinal contours otherwise unremarkable.  Osteopenia and degenerative changes throughout the thoracic spine.  Thoracolumbar scoliosis convex left.  IMPRESSION:  1.  Right upper lobe pneumonia. 2.  Bilateral pleural effusions, right greater than left, and associated passive atelectasis versus pneumonia in the lower lobes. 3.  Stable moderate cardiomegaly.  Pulmonary venous hypertension without overt edema.   Original Report Authenticated By: Arnell Sieving, M.D.     Date: 11/18/2011  Rate: 108  Rhythm: atrial fibrillation  QRS Axis: normal  Intervals: normal  ST/T Wave abnormalities: nonspecific ST/T changes  Conduction Disutrbances:none  Narrative Interpretation:  Q waves anteroseptal leads, ventricular bigeminy  Old EKG Reviewed: unchanged from 11/02/2011 except HR was 97     1. Hospital-acquired pneumonia   2. Anemia due to chronic illness   3. Atrial fibrillation   4. C. difficile colitis   5. Healthcare-associated pneumonia  Plan admission  Devoria Albe, MD, FACEP   MDM          Ward Givens, MD 11/19/11 [phone]

## 2011-11-19 ENCOUNTER — Encounter (HOSPITAL_COMMUNITY): Payer: Self-pay | Admitting: Internal Medicine

## 2011-11-19 DIAGNOSIS — J189 Pneumonia, unspecified organism: Secondary | ICD-10-CM

## 2011-11-19 DIAGNOSIS — A0472 Enterocolitis due to Clostridium difficile, not specified as recurrent: Secondary | ICD-10-CM

## 2011-11-19 DIAGNOSIS — D638 Anemia in other chronic diseases classified elsewhere: Secondary | ICD-10-CM

## 2011-11-19 DIAGNOSIS — I4891 Unspecified atrial fibrillation: Secondary | ICD-10-CM

## 2011-11-19 LAB — CBC
HCT: 28.4 % — ABNORMAL LOW (ref 36.0–46.0)
Hemoglobin: 8.9 g/dL — ABNORMAL LOW (ref 12.0–15.0)
MCV: 94.7 fL (ref 78.0–100.0)
RBC: 3 MIL/uL — ABNORMAL LOW (ref 3.87–5.11)
RDW: 15.3 % (ref 11.5–15.5)
WBC: 4.6 10*3/uL (ref 4.0–10.5)

## 2011-11-19 LAB — EXPECTORATED SPUTUM ASSESSMENT W GRAM STAIN, RFLX TO RESP C

## 2011-11-19 LAB — COMPREHENSIVE METABOLIC PANEL
Albumin: 2.6 g/dL — ABNORMAL LOW (ref 3.5–5.2)
Alkaline Phosphatase: 63 U/L (ref 39–117)
BUN: 11 mg/dL (ref 6–23)
Creatinine, Ser: 1.33 mg/dL — ABNORMAL HIGH (ref 0.50–1.10)
GFR calc Af Amer: 41 mL/min — ABNORMAL LOW (ref >90)
Glucose, Bld: 95 mg/dL (ref 70–99)
Total Bilirubin: 0.4 mg/dL (ref 0.3–1.2)
Total Protein: 5.9 g/dL — ABNORMAL LOW (ref 6.0–8.3)

## 2011-11-19 LAB — MRSA PCR SCREENING: MRSA by PCR: NEGATIVE

## 2011-11-19 LAB — TROPONIN I
Troponin I: <0.3 ng/mL (ref <0.30)
Troponin I: <0.3 ng/mL (ref <0.30)

## 2011-11-19 MED ORDER — SACCHAROMYCES BOULARDII 250 MG PO CAPS
250.0000 mg | ORAL_CAPSULE | Freq: Two times a day (BID) | ORAL | Status: DC
  Administered 2011-11-19 – 2011-11-21 (×5): 250 mg via ORAL
  Filled 2011-11-19 (×6): qty 1

## 2011-11-19 MED ORDER — VANCOMYCIN 50 MG/ML ORAL SOLUTION
125.0000 mg | Freq: Four times a day (QID) | ORAL | Status: DC
  Administered 2011-11-19 – 2011-11-21 (×10): 125 mg via ORAL
  Filled 2011-11-19 (×12): qty 2.5

## 2011-11-19 MED ORDER — DILTIAZEM HCL ER COATED BEADS 240 MG PO CP24
240.0000 mg | ORAL_CAPSULE | Freq: Every day | ORAL | Status: DC
  Administered 2011-11-19 – 2011-11-21 (×3): 240 mg via ORAL
  Filled 2011-11-19 (×3): qty 1

## 2011-11-19 MED ORDER — INFLUENZA VIRUS VACC SPLIT PF IM SUSP
0.5000 mL | Freq: Once | INTRAMUSCULAR | Status: AC
  Administered 2011-11-19: 0.5 mL via INTRAMUSCULAR
  Filled 2011-11-19: qty 0.5

## 2011-11-19 MED ORDER — LEVOFLOXACIN IN D5W 750 MG/150ML IV SOLN: 750.0000 mg | INTRAVENOUS | Status: DC

## 2011-11-19 MED ORDER — FUROSEMIDE 40 MG PO TABS
40.0000 mg | ORAL_TABLET | Freq: Every day | ORAL | Status: DC
Start: 2011-11-19 — End: 2011-11-21
  Administered 2011-11-19 – 2011-11-21 (×3): 40 mg via ORAL
  Filled 2011-11-19 (×3): qty 1

## 2011-11-19 MED ORDER — RIVAROXABAN 20 MG PO TABS
20.0000 mg | ORAL_TABLET | Freq: Every day | ORAL | Status: DC
  Administered 2011-11-19 – 2011-11-20 (×2): 20 mg via ORAL
  Filled 2011-11-19 (×2): qty 1

## 2011-11-19 MED ORDER — AMIODARONE HCL 200 MG PO TABS
200.0000 mg | ORAL_TABLET | Freq: Two times a day (BID) | ORAL | Status: DC
  Administered 2011-11-19 – 2011-11-21 (×5): 200 mg via ORAL
  Filled 2011-11-19 (×6): qty 1

## 2011-11-19 MED ORDER — ALLOPURINOL 100 MG PO TABS
100.0000 mg | ORAL_TABLET | Freq: Every day | ORAL | Status: DC
  Administered 2011-11-19 – 2011-11-21 (×3): 100 mg via ORAL
  Filled 2011-11-19 (×3): qty 1

## 2011-11-19 MED ORDER — VANCOMYCIN HCL 1000 MG IV SOLR
750.0000 mg | INTRAVENOUS | Status: DC
  Administered 2011-11-19 – 2011-11-21 (×3): 750 mg via INTRAVENOUS
  Filled 2011-11-19 (×3): qty 750

## 2011-11-19 MED ORDER — RALOXIFENE HCL 60 MG PO TABS
60.0000 mg | ORAL_TABLET | Freq: Every day | ORAL | Status: DC
  Administered 2011-11-19 – 2011-11-21 (×3): 60 mg via ORAL
  Filled 2011-11-19 (×3): qty 1

## 2011-11-19 MED ORDER — DEXTROSE 5 % IV SOLN: 2.0000 g | Freq: Three times a day (TID) | INTRAVENOUS | Status: DC

## 2011-11-19 MED ORDER — ATORVASTATIN CALCIUM 40 MG PO TABS
40.0000 mg | ORAL_TABLET | Freq: Every day | ORAL | Status: DC
  Administered 2011-11-19 – 2011-11-20 (×2): 40 mg via ORAL
  Filled 2011-11-19 (×3): qty 1

## 2011-11-19 MED ORDER — SODIUM CHLORIDE 0.9 % IJ SOLN: 3.0000 mL | INTRAMUSCULAR | Status: DC | PRN

## 2011-11-19 MED ORDER — LEVOFLOXACIN IN D5W 750 MG/150ML IV SOLN
750.0000 mg | INTRAVENOUS | Status: DC
  Administered 2011-11-20: 750 mg via INTRAVENOUS
  Filled 2011-11-19 (×2): qty 150

## 2011-11-19 MED ORDER — SODIUM CHLORIDE 0.9 % IV SOLN: 250.0000 mL | INTRAVENOUS | Status: DC | PRN

## 2011-11-19 MED ORDER — BISACODYL 5 MG PO TBEC: 5.0000 mg | DELAYED_RELEASE_TABLET | Freq: Every day | ORAL | Status: DC | PRN

## 2011-11-19 MED ORDER — LEVALBUTEROL TARTRATE 45 MCG/ACT IN AERO
1.0000 | INHALATION_SPRAY | RESPIRATORY_TRACT | Status: DC | PRN
  Filled 2011-11-19: qty 15

## 2011-11-19 MED ORDER — SODIUM CHLORIDE 0.9 % IJ SOLN
3.0000 mL | Freq: Two times a day (BID) | INTRAMUSCULAR | Status: DC
  Administered 2011-11-19 – 2011-11-21 (×4): 3 mL via INTRAVENOUS

## 2011-11-19 MED ORDER — FLUTICASONE-SALMETEROL 250-50 MCG/DOSE IN AEPB
1.0000 | INHALATION_SPRAY | Freq: Two times a day (BID) | RESPIRATORY_TRACT | Status: DC
  Administered 2011-11-19 – 2011-11-21 (×5): 1 via RESPIRATORY_TRACT
  Filled 2011-11-19: qty 14

## 2011-11-19 MED ORDER — PNEUMOCOCCAL VAC POLYVALENT 25 MCG/0.5ML IJ INJ
0.5000 mL | INJECTION | Freq: Once | INTRAMUSCULAR | Status: AC
  Administered 2011-11-19: 0.5 mL via INTRAMUSCULAR
  Filled 2011-11-19: qty 0.5

## 2011-11-19 MED ORDER — DEXTROSE 5 % IV SOLN
1.0000 g | Freq: Three times a day (TID) | INTRAVENOUS | Status: DC
  Administered 2011-11-19 – 2011-11-21 (×7): 1 g via INTRAVENOUS
  Filled 2011-11-19 (×8): qty 1

## 2011-11-19 MED ORDER — DEXTROSE 5 % IV SOLN
2.0000 g | Freq: Once | INTRAVENOUS | Status: AC
  Administered 2011-11-19: 2 g via INTRAVENOUS
  Filled 2011-11-19: qty 2

## 2011-11-19 NOTE — Progress Notes (Signed)
[facility] PROGRESS NOTE  [name] [name] ZOX:[id number] DOB: 1923-07-12 DOA: 11/18/2011 PCP: [name] Ok, MD  Brief narrative: 78 Y/O female with hx of HTN, CAD, Afib with recent hospitalization for c diff relapse returns with cough and chills with findings go b/l lung infiltrate consistent with HCAP.    Assessment/Plan: Principal Problem:  *Healthcare-associated pneumonia -patient started on IV vanco, aztreonam and Levaquin for broad coverage. Follow blood cx, sputum cx, urine for legionella -nebs prn. Cont advair Tylenol prn for fever.   Active Problems:  Atrial fibrillation Rate controlled. Continue cardizem , xeralto and amiodarone   cdiff  completing her 2 weeks course of po vancomycin . Recent hospitalization for relapse.  No further diarrhea. Cont florostar   COPD:  cont nebs, advair . o2 via [place]  Hyperlipidemia  cont lipitor and fish oil  Anemia of chronic disease:  stable   CKD: mild and stable    Code Status: full Family Communication: daughter at bedisde Disposition Plan: home once stable    Consultants:  NONE  Procedures:  none  Antibiotics:  On IV vanco, levaquin and aztreonam  HPI/Subjective: Feels her SOB to be better, afebrile but has productive cough  Objective: Filed Vitals:   11/19/11 0216 11/19/11 0624 11/19/11 0851 11/19/11 0918  BP: 114/69 105/60    Pulse: 80 93    Temp: 98.3 F (36.8 C) 97.6 F (36.4 C)    TempSrc: Oral Oral    Resp: 20 22    Height: 5\' 1"  (1.549 m)     Weight: 77.8 kg (171 lb 8.3 oz)     SpO2: 94% 96% 91% 90%    Intake/Output Summary (Last 24 hours) at 11/19/11 1350 Last data filed at 11/19/11 0900  Gross per 24 hour  Intake      0 ml  Output    300 ml  Net   -300 ml   Filed Weights   11/19/11 0216  Weight: 77.8 kg (171 lb 8.3 oz)    Exam:   General:  Elderly female in NAD  HEENT: no pallor, moist oral mucosa  Cardiovascular: s1 &s2 irregular, no murmurs  Respiratory:  bibasilar crackles  Abdomen: soft, NT ND BS+  EXT: Warm, no edema  CNS: AAOX3  Data Reviewed: Basic Metabolic Panel:  Lab 11/19/11 8119 11/18/11 2210 11/18/11 1257  NA 138 138 138  K 4.3 4.0 4.1  CL 102 99 101  CO2 30 30 29   GLUCOSE 95 114* 93  BUN 11 13 13   CREATININE 1.33* 1.34* 1.4*  CALCIUM 8.7 9.0 8.8  MG -- -- --  PHOS -- -- --   Liver Function Tests:  Lab 11/19/11 0535 11/18/11 2210  AST 16 14  ALT 11 11  ALKPHOS 63 70  BILITOT 0.4 0.5  PROT 5.9* 6.3  ALBUMIN 2.6* 3.0*   No results found for this basename: LIPASE:5,AMYLASE:5 in the last 168 hours No results found for this basename: AMMONIA:5 in the last 168 hours CBC:  Lab 11/19/11 0535 11/18/11 2210  WBC 4.6 7.3  NEUTROABS -- 5.4  HGB 8.9* 9.5*  HCT 28.4* 29.9*  MCV 94.7 93.4  PLT 247 281   Cardiac Enzymes:  Lab 11/19/11 1145 11/19/11 0535  CKTOTAL -- --  CKMB -- --  CKMBINDEX -- --  TROPONINI <0.30 <0.30   BNP (last 3 results)  Basename 10/01/11 0718 09/29/11 0008 09/28/11 2200  PROBNP 1563.0* 2283.0* 2255.0*   CBG: No results found for this basename: GLUCAP:5 in the last 168 hours  No results found for this or any previous visit (from the past 240 hour(s)).   Studies: Dg Chest 2 View  11/18/2011  *RADIOLOGY REPORT*  Clinical Data: Cough.  Shortness of breath.  Chest tightness. History of hypertension and COPD.  CHEST - 2 VIEW  Comparison: Two-view chest x-ray 10/31/2011, 09/28/2011, 02/04/2009.  Findings: Cardiac silhouette moderately enlarged but stable. Pulmonary venous hypertension without overt edema.  Airspace consolidation in the right upper lobe.  Bilateral pleural effusions, right greater than left, and associated consolidation in the lower lobes.  Thoracic aorta atherosclerotic, unchanged.  Hilar and mediastinal contours otherwise unremarkable.  Osteopenia and degenerative changes throughout the thoracic spine.  Thoracolumbar scoliosis convex left.  IMPRESSION:  1.  Right upper lobe  pneumonia. 2.  Bilateral pleural effusions, right greater than left, and associated passive atelectasis versus pneumonia in the lower lobes. 3.  Stable moderate cardiomegaly.  Pulmonary venous hypertension without overt edema.   Original Report Authenticated By: Arnell Sieving, M.D.     Scheduled Meds:   . allopurinol  100 mg Oral Daily  . amiodarone  200 mg Oral BID  . atorvastatin  40 mg Oral Daily  . aztreonam  1 g Intravenous Q8H  . aztreonam  2 g Intravenous Once  . diltiazem  240 mg Oral Daily  . Fluticasone-Salmeterol  1 puff Inhalation BID  . furosemide  40 mg Oral Daily  . influenza  inactive virus vaccine  0.5 mL Intramuscular Once  . levofloxacin (LEVAQUIN) IV  750 mg Intravenous Q48H  . moxifloxacin  400 mg Intravenous Once  . pneumococcal 23 valent vaccine  0.5 mL Intramuscular Once  . raloxifene  60 mg Oral Daily  . Rivaroxaban  20 mg Oral Daily  . saccharomyces boulardii  250 mg Oral BID  . sodium chloride  3 mL Intravenous Q12H  . vancomycin  125 mg Oral Q6H  . vancomycin  750 mg Intravenous Q24H  . DISCONTD: aztreonam  2 g Intravenous Q8H  . DISCONTD: levofloxacin (LEVAQUIN) IV  750 mg Intravenous Q24H   Continuous Infusions:      Time spent: 25 minutes    [name] [name]  [facility] Pager [phone]. If 8PM-8AM, please contact night-coverage at [email or web address], password [facility] 11/19/2011, 1:50 PM  LOS: 1 day

## 2011-11-19 NOTE — Progress Notes (Signed)
ANTIBIOTIC CONSULT NOTE - INITIAL  Pharmacy Consult for Vancomycin & Adjustment of other antibiotics for renal dysfunction Indication: pneumonia  Allergies  Allergen Reactions  . Baby Powder (Methylbenzethonium) Shortness Of Breath  . Demerol (Meperidine) Hives  . Penicillins Hives    Patient Measurements: Height: 5\' 1"  (154.9 cm) Weight: 171 lb 8.3 oz (77.8 kg) IBW/kg (Calculated) : 47.8    Vital Signs: Temp: 98.3 F (36.8 C) (09/14 0216) Temp src: Oral (09/14 0216) BP: 114/69 mmHg (09/14 0216) Pulse Rate: 80  (09/14 0216) Intake/Output from previous day:   Intake/Output from this shift:    Labs:  Basename 11/18/11 2210 11/18/11 1257  WBC 7.3 --  HGB 9.5* --  PLT 281 --  LABCREA -- --  CREATININE 1.34* 1.4*   Estimated Creatinine Clearance: 28.4 ml/min (by C-G formula based on Cr of 1.34). No results found for this basename: VANCOTROUGH:2,VANCOPEAK:2,VANCORANDOM:2,GENTTROUGH:2,GENTPEAK:2,GENTRANDOM:2,TOBRATROUGH:2,TOBRAPEAK:2,TOBRARND:2,AMIKACINPEAK:2,AMIKACINTROU:2,AMIKACIN:2, in the last 72 hours   Microbiology: Recent Results (from the past 720 hour(s))  STOOL CULTURE     Status: Normal   Collection Time   11/03/11  1:34 AM      Component Value Range Status Comment   Specimen Description STOOL   Final    Special Requests NONE   Final    Culture     Final    Value: NO SALMONELLA, SHIGELLA, CAMPYLOBACTER, YERSINIA, OR E.COLI 0157:H7 ISOLATED   Report Status 11/07/2011 FINAL   Final   CLOSTRIDIUM DIFFICILE BY PCR     Status: Abnormal   Collection Time   11/03/11  1:35 AM      Component Value Range Status Comment   C difficile by pcr POSITIVE (*) NEGATIVE Final     Medical History: Past Medical History  Diagnosis Date  . Hypertension   . Hyperlipidemia   . COPD (chronic obstructive pulmonary disease)   . Osteoporosis   . Arthritis   . H/O hiatal hernia   . Chronic back pain   . CAD (coronary artery disease)     Cath 2005 with moderate CAD, Dr.  [place]  . Atrial fibrillation   . C. difficile diarrhea   . Gout     Medications:  Scheduled:    . allopurinol  100 mg Oral Daily  . amiodarone  200 mg Oral BID  . atorvastatin  40 mg Oral Daily  . aztreonam  2 g Intravenous Q8H  . diltiazem  240 mg Oral Daily  . Fluticasone-Salmeterol  1 puff Inhalation BID  . furosemide  40 mg Oral Daily  . levofloxacin (LEVAQUIN) IV  750 mg Intravenous Q24H  . moxifloxacin  400 mg Intravenous Once  . raloxifene  60 mg Oral Daily  . Rivaroxaban  20 mg Oral Daily  . saccharomyces boulardii  250 mg Oral BID  . sodium chloride  3 mL Intravenous Q12H  . vancomycin  125 mg Oral Q6H   Infusions:   Assessment:  78 year old female presents with cough and fatigue  H/O COPD.  June hospitalization for salmonella poisoning along with C Diff and HAP.  Hospitalized again in August for C Diff colitis.  Chest XRay shows right upper lobe PNA  Patient received Avelox 400mg  IV x 1 in ED @ 00:09 on 9/14  Vancomycin, Levaquin and Azactam to continue for healthcare associated PNA  RX asked to dose vancomycin and to adjust dosages of Levaquin and Azactam based on renal function  Presently, CrCl ~ 28 ml/min  Goal of Therapy:  Vancomycin trough level 15-20 mcg/ml Eradication  of infection  Plan:   Measure antibiotic drug levels at steady state  Follow up culture results  Vancomycin 750mg  IV q24h x 8 days (duration per MD specification in med consult)  Change Azactam to 2gm IV x 1 then 1gm IV q8h x 8 days  Change Levaquin to 750mg  IV q48hr x 3 days  Maryellen Pile, PharmD 11/19/2011,2:42 AM

## 2011-11-19 NOTE — ED Notes (Signed)
MD at bedside. 

## 2011-11-19 NOTE — H&P (Signed)
PCP:   [name], MD  Cardiology: [facility])  Chief Complaint:   Cough, fatigue  HPI: [name] [name] is a 78 y.o. female   has a past medical history of Hypertension; Hyperlipidemia; COPD (chronic obstructive pulmonary disease); Osteoporosis; Arthritis; H/O hiatal hernia; Chronic back pain; CAD (coronary artery disease); Atrial fibrillation; C. difficile diarrhea; and Gout.   Presented with  Prolonged cough and not feeling well. She have been recently hospitalized in June for salmonella poisoning complicated by C.diff infection and HAP. She was discharged after 2 weeks and returned again for A.fib with RVR and then in August again hospitalized for C.diff colitis. She is still on po vancomycin and her diarrhea is improving.  She has not have any fever. Cough occasionally productive of sputum. She has had been worsening from shortness of breath stand point and now has new 2L o2 requirement. Denies pleuritic chest pain, has occasional pain between shoulder blades.  Review of Systems:    Pertinent positives include: excess mucus, shortness of breath at rest. productive cough,  Constitutional:  No weight loss, night sweats, Fevers, chills, fatigue, weight loss  HEENT:  No headaches, Difficulty swallowing,Tooth/dental problems,Sore throat,  No sneezing, itching, ear ache, nasal congestion, post nasal drip,  Cardio-vascular:  No chest pain, Orthopnea, PND, anasarca, dizziness, palpitations.no Bilateral lower extremity swelling  GI:  No heartburn, indigestion, abdominal pain, nausea, vomiting, diarrhea, change in bowel habits, loss of appetite, melena, blood in stool, hematemesis Resp:   No dyspnea on exertion, No non-productive cough, No coughing up of blood.No change in color of mucus.No wheezing. Skin:  no rash or lesions. No jaundice GU:  no dysuria, change in color of urine, no urgency or frequency. No straining to urinate.  No flank pain.  Musculoskeletal:  No joint pain or  no joint swelling. No decreased range of motion. No back pain.  Psych:  No change in mood or affect. No depression or anxiety. No memory loss.  Neuro: no localizing neurological complaints, no tingling, no weakness, no double vision, no gait abnormality, no slurred speech, no confusion  Otherwise ROS are negative except for above, 10 systems were reviewed  Past Medical History: Past Medical History  Diagnosis Date  . Hypertension   . Hyperlipidemia   . COPD (chronic obstructive pulmonary disease)   . Osteoporosis   . Arthritis   . H/O hiatal hernia   . Chronic back pain   . CAD (coronary artery disease)     Cath 2005 with moderate CAD, Dr. [place]  . Atrial fibrillation   . C. difficile diarrhea   . Gout    Past Surgical History  Procedure Date  . Carotid endarterectomy     bilateral  . Cataract extraction, bilateral   . Joint replacement 2008    Right Total Knee  . Anal fistula repair   . Left parotidectomy   . Eye surgery   . Flexible sigmoidoscopy 09/02/2011    Procedure: FLEXIBLE SIGMOIDOSCOPY;  Surgeon: Theda Belfast, MD;  Location: [facility] ENDOSCOPY;  Service: Endoscopy;  Laterality: N/A;  . Flexible sigmoidoscopy 09/03/2011    Procedure: FLEXIBLE SIGMOIDOSCOPY;  Surgeon: Theda Belfast, MD;  Location: [facility] ENDOSCOPY;  Service: Endoscopy;  Laterality: N/A;  . Tee without cardioversion 10/04/2011    Procedure: TRANSESOPHAGEAL ECHOCARDIOGRAM (TEE);  Surgeon: Laurey Morale, MD;  Location: [facility] ENDOSCOPY;  Service: Cardiovascular;  Laterality: N/A;  to be carelinked here by 1230-verified 7/29/dl  . Cardioversion 10/04/2011    Procedure: CARDIOVERSION;  Surgeon: Eliot Ford  Shirlee Latch, MD;  Location: [facility] ENDOSCOPY;  Service: Cardiovascular;  Laterality: N/A;     Medications: Prior to Admission medications   Medication Sig Start Date End Date Taking? Authorizing Provider  allopurinol (ZYLOPRIM) 100 MG tablet Take 100 mg by mouth daily.    Yes Historical Provider, MD  amiodarone  (PACERONE) 200 MG tablet Take 200 mg by mouth 2 (two) times daily.   Yes Historical Provider, MD  atorvastatin (LIPITOR) 40 MG tablet Take 40 mg by mouth daily.   Yes Historical Provider, MD  diltiazem (CARDIZEM CD) 240 MG 24 hr capsule Take 1 capsule (240 mg total) by mouth daily. 09/15/11 09/14/12 Yes Adeline Joselyn Glassman, MD  fish oil-omega-3 fatty acids 1000 MG capsule Take 1 g by mouth 2 (two) times daily.    Yes Historical Provider, MD  Fluticasone-Salmeterol (ADVAIR) 250-50 MCG/DOSE AEPB Inhale 1 puff into the lungs 2 (two) times daily. 09/15/11  Yes Adeline C Viyuoh, MD  furosemide (LASIX) 40 MG tablet Take 40 mg by mouth daily.   Yes Historical Provider, MD  levalbuterol [facility] HFA) 45 MCG/ACT inhaler Inhale 1-2 puffs into the lungs every 4 (four) hours as needed for wheezing. 09/15/11 09/14/12 Yes Adeline Joselyn Glassman, MD  raloxifene (EVISTA) 60 MG tablet Take 60 mg by mouth daily.   Yes Historical Provider, MD  Rivaroxaban (XARELTO) 20 MG TABS Take 20 mg by mouth daily. 11/08/11  Yes Kathleene Hazel, MD  saccharomyces boulardii (FLORASTOR) 250 MG capsule Take 250 mg by mouth 2 (two) times daily.   Yes Historical Provider, MD  vancomycin (VANCOCIN) 50 mg/mL oral solution Take 125 mg by mouth every 6 (six) hours. 11/04/11  Yes Nishant Dhungel, MD    Allergies:   Allergies  Allergen Reactions  . Baby Powder (Methylbenzethonium) Shortness Of Breath  . Demerol (Meperidine) Hives  . Penicillins Hives    Social History:  Ambulatory  independently needs occasional walker now Lives at  [facility] [employer]   reports that she quit smoking about 28 years ago. Her smoking use included Cigarettes. She has a 20 pack-year smoking history. She has never used smokeless tobacco. She reports that she does not drink alcohol or use illicit drugs.   Family History: family history includes Diabetes type II in her sister and Hemochromatosis in her sister.    Physical Exam: Patient Vitals for the past 24  hrs:  BP Temp Temp src Pulse Resp SpO2  11/19/11 0015 - - - 101  22  95 %  11/19/11 0000 - - - 108  22  94 %  11/18/11 2345 - - - 102  21  94 %  11/18/11 2330 - - - 113  18  93 %  11/18/11 2325 - - - 96  20  91 %  11/18/11 2315 - - - 105  22  95 %  11/18/11 2300 - - - 108  20  93 %  11/18/11 2245 - - - 104  21  94 %  11/18/11 2230 - - - 111  25  94 %  11/18/11 2215 - - - 50  23  96 %  11/18/11 2200 - - - 102  23  95 %  11/18/11 2145 - - - 42  24  93 %  11/18/11 2140 - - - - - 82 %  11/18/11 2130 - - - 44  23  95 %  11/18/11 2127 - - - 47  - 85 %  11/18/11 2126 - - - 72  -  83 %  11/18/11 1817 125/66 mmHg 98.5 F (36.9 C) Oral 128  - 92 %    1. General:  in No Acute distress 2. Psychological: Alert and Oriented 3. Head/ENT:    Dry Mucous Membranes                          Head Non traumatic, neck supple                          Normal  Dentition 4. SKIN: decreased Skin turgor,  Skin clean Dry and intact no rash 5. Heart: Regular rate and rhythm no Murmur, Rub or gallop 6. Lungs: no wheezes occasional crackles, worse worse on the left   7. Abdomen: Soft, non-tender, Non distended 8. Lower extremities: no clubbing, cyanosis, or edema 9. Neurologically Grossly intact, moving all 4 extremities equally 10. MSK: Normal range of motion  body mass index is unknown because there is no height or weight on file.   Labs on Admission:   [facility] 11/18/11 2210 11/18/11 1257  NA 138 138  K 4.0 4.1  CL 99 101  CO2 30 29  GLUCOSE 114* 93  BUN 13 13  CREATININE 1.34* 1.4*  CALCIUM 9.0 8.8  MG -- --  PHOS -- --    Basename 11/18/11 2210  AST 14  ALT 11  ALKPHOS 70  BILITOT 0.5  PROT 6.3  ALBUMIN 3.0*   No results found for this basename: LIPASE:2,AMYLASE:2 in the last 72 hours  Basename 11/18/11 2210  WBC 7.3  NEUTROABS 5.4  HGB 9.5*  HCT 29.9*  MCV 93.4  PLT 281   No results found for this basename: CKTOTAL:3,CKMB:3,CKMBINDEX:3,TROPONINI:3 in the last 72 hours No  results found for this basename: TSH,T4TOTAL,FREET3,T3FREE,THYROIDAB in the last 72 hours No results found for this basename: VITAMINB12:2,FOLATE:2,FERRITIN:2,TIBC:2,IRON:2,RETICCTPCT:2 in the last 72 hours Lab Results  Component Value Date   HGBA1C 6.4* 09/29/2011    The CrCl is unknown because both a height and weight (above a minimum accepted value) are required for this calculation. ABG    Component Value Date/Time   PHART 7.340* 09/08/2011 1048   HCO3 20.9 09/08/2011 1048   TCO2 19.5 09/08/2011 1048   ACIDBASEDEF 4.0* 09/08/2011 1048   O2SAT 94.4 09/08/2011 1048     Lab Results  Component Value Date   DDIMER  Value: 3.78        AT THE INHOUSE ESTABLISHED CUTOFF VALUE OF 0.48 ug/mL FEU, THIS ASSAY HAS BEEN DOCUMENTED IN THE LITERATURE TO HAVE* 04/18/2007     Other results:  I have pearsonaly reviewed this: ECG REPORT  Rate: 108  Rhythm: atrial fibrilation ST&T Change: occasional PVC's  UA no evidence of infection  Cultures:    Component Value Date/Time   SDES STOOL 11/03/2011 0134   SPECREQUEST NONE 11/03/2011 0134   CULT NO SALMONELLA, SHIGELLA, CAMPYLOBACTER, YERSINIA, OR E.COLI 0157:H7 ISOLATED 11/03/2011 0134   REPTSTATUS 11/07/2011 FINAL 11/03/2011 0134       Radiological Exams on Admission: Dg Chest 2 View  11/18/2011  *RADIOLOGY REPORT*  Clinical Data: Cough.  Shortness of breath.  Chest tightness. History of hypertension and COPD.  CHEST - 2 VIEW  Comparison: Two-view chest x-ray 10/31/2011, 09/28/2011, 02/04/2009.  Findings: Cardiac silhouette moderately enlarged but stable. Pulmonary venous hypertension without overt edema.  Airspace consolidation in the right upper lobe.  Bilateral pleural effusions, right greater than left, and associated consolidation in the  lower lobes.  Thoracic aorta atherosclerotic, unchanged.  Hilar and mediastinal contours otherwise unremarkable.  Osteopenia and degenerative changes throughout the thoracic spine.  Thoracolumbar scoliosis convex  left.  IMPRESSION:  1.  Right upper lobe pneumonia. 2.  Bilateral pleural effusions, right greater than left, and associated passive atelectasis versus pneumonia in the lower lobes. 3.  Stable moderate cardiomegaly.  Pulmonary venous hypertension without overt edema.   Original Report Authenticated By: Arnell Sieving, M.D.     Chart has been reviewed  Assessment/Plan  78 yo w HAP here on IV antibiotics has recent history of C. difficile colitis  Present on Admission:  .Healthcare-associated pneumonia - will start on broad-spectrum antibiotics since her recent admissions and history of hospital-acquired pneumonia in the past. Given penicillin allergy would do aztreonam Levaquin and vancomycine IV I am concerned given recent history of C. difficile that  this can cause a recurrence  .Atrial fibrillation - continue amiodarone and Cardizem and monitor on telemetry currently rate controlled  .Anemia due to chronic illness - stable  History of C. difficile colitis  -  She says her diarrhea is much better and she is finishing her by mouth vancomycin to finish the course 3 days     Prophylaxis: xarelto Protonix  CODE STATUS: FULL CODE  Other plan as per orders.  I have spent a total of 55 min on this admission  [name] [name] 11/19/2011, 1:17 AM

## 2011-11-20 DIAGNOSIS — I959 Hypotension, unspecified: Secondary | ICD-10-CM

## 2011-11-20 DIAGNOSIS — I509 Heart failure, unspecified: Secondary | ICD-10-CM

## 2011-11-20 MED ORDER — RIVAROXABAN 15 MG PO TABS: 15.0000 mg | ORAL_TABLET | Freq: Every day | ORAL | Status: DC

## 2011-11-20 MED ORDER — RIVAROXABAN 15 MG PO TABS
15.0000 mg | ORAL_TABLET | Freq: Every day | ORAL | Status: DC
  Administered 2011-11-21: 15 mg via ORAL
  Filled 2011-11-20 (×2): qty 1

## 2011-11-20 NOTE — Progress Notes (Addendum)
[facility] PROGRESS NOTE  [name] [name]:[id number] DOB: 1923-08-12 DOA: 11/18/2011 PCP: [name], MD  Brief narrative:  78 Y/O female with hx of HTN, CAD, Afib with recent hospitalization for c diff relapse returns with cough and chills with findings go b/l lung infiltrate consistent with HCAP.   Assessment/Plan:   Principal Problem:  *Healthcare-associated pneumonia  -patient started on IV vanco, aztreonam and Levaquin for broad coverage. ( day 2) cx so far negative.  urine for legionella  -nebs prn. Cont advair  Tylenol prn for fever.  -clinically improving. Will narrow coverage and discharge in 1-2 days if continues to improve   Active Problems:  Atrial fibrillation  Rate controlled. Continue cardizem , xeralto and amiodarone   cdiff completing her 2 weeks course of po vancomycin . Recent hospitalization for relapse.  No further diarrhea. Cont florostar   COPD:  cont nebs, advair . o2 via [place]   CHF Last EF of 45% in July . Currently euvolemic but has some bibasilar crackles. Cont lasix  Hyperlipidemia  cont lipitor and fish oil   Low BP: Noted this am with SBP in 80s . Will hold BP meds and monitor  Anemia of chronic disease:  stable   CKD: mild and stable   Code Status: full   Family Communication: none today  Disposition Plan: home once stable   Consultants:  NONE  Procedures:  None  Antibiotics:  On IV vanco, levaquin and aztreonam( since 9/14)  HPI/Subjective:  Feels her SOB to be better. Cough improved   Objective: Filed Vitals:   11/19/11 1844 11/19/11 2057 11/20/11 0630 11/20/11 0920  BP: 113/71 115/68 121/66   Pulse: 105 93 72   Temp: 98.5 F (36.9 C) 99.1 F (37.3 C) 97.7 F (36.5 C)   TempSrc: Oral Oral Oral   Resp: 20 20 20    Height:      Weight:   77.157 kg (170 lb 1.6 oz)   SpO2: 95% 95% 94% 95%    Intake/Output Summary (Last 24 hours) at 11/20/11 0953 Last data filed at 11/20/11 0500  Gross per 24 hour    Intake   1160 ml  Output   1150 ml  Net     10 ml   Filed Weights   11/19/11 0216 11/20/11 0630  Weight: 77.8 kg (171 lb 8.3 oz) 77.157 kg (170 lb 1.6 oz)    Exam:  Brief narrative:  78 year-old female with history of hypertension, diabetes mellitus type 2, hepatitis C, diastolic CHF as per echo in 2009 presents with complaint of abdominal pain secondary to acute pancreatitis.    General: Elderly female in NAD  HEENT: no pallor, moist oral mucosa  Cardiovascular: s1 &s2 irregular, no murmurs  Respiratory: bibasilar crackles  Abdomen: soft, NT ND BS+  EXT: Warm, no edema  CNS: AAOX3   Data Reviewed: Basic Metabolic Panel:  Lab 11/19/11 8119 11/18/11 2210 11/18/11 1257  NA 138 138 138  K 4.3 4.0 4.1  CL 102 99 101  CO2 30 30 29   GLUCOSE 95 114* 93  BUN 11 13 13   CREATININE 1.33* 1.34* 1.4*  CALCIUM 8.7 9.0 8.8  MG -- -- --  PHOS -- -- --   Liver Function Tests:  Lab 11/19/11 0535 11/18/11 2210  AST 16 14  ALT 11 11  ALKPHOS 63 70  BILITOT 0.4 0.5  PROT 5.9* 6.3  ALBUMIN 2.6* 3.0*   No results found for this basename: LIPASE:5,AMYLASE:5 in the last 168 hours  No results found for this basename: AMMONIA:5 in the last 168 hours CBC:  Lab 11/19/11 0535 11/18/11 2210  WBC 4.6 7.3  NEUTROABS -- 5.4  HGB 8.9* 9.5*  HCT 28.4* 29.9*  MCV 94.7 93.4  PLT 247 281   Cardiac Enzymes:  Lab 11/19/11 1705 11/19/11 1145 11/19/11 0535  CKTOTAL -- -- --  CKMB -- -- --  CKMBINDEX -- -- --  TROPONINI <0.30 <0.30 <0.30   BNP (last 3 results)  Basename 10/01/11 0718 09/29/11 0008 09/28/11 2200  PROBNP 1563.0* 2283.0* 2255.0*   CBG: No results found for this basename: GLUCAP:5 in the last 168 hours  Recent Results (from the past 240 hour(s))  MRSA PCR SCREENING     Status: Normal   Collection Time   11/19/11  2:32 PM      Component Value Range Status Comment   MRSA by PCR NEGATIVE  NEGATIVE Final   CULTURE, EXPECTORATED SPUTUM-ASSESSMENT     Status: Normal    Collection Time   11/19/11  8:37 PM      Component Value Range Status Comment   Specimen Description SPUTUM   Final    Special Requests Normal   Final    Sputum evaluation     Final    Value: THIS SPECIMEN IS ACCEPTABLE. RESPIRATORY CULTURE REPORT TO FOLLOW.   Report Status 11/19/2011 FINAL   Final      Studies: Dg Chest 2 View  11/18/2011  *RADIOLOGY REPORT*  Clinical Data: Cough.  Shortness of breath.  Chest tightness. History of hypertension and COPD.  CHEST - 2 VIEW  Comparison: Two-view chest x-ray 10/31/2011, 09/28/2011, 02/04/2009.  Findings: Cardiac silhouette moderately enlarged but stable. Pulmonary venous hypertension without overt edema.  Airspace consolidation in the right upper lobe.  Bilateral pleural effusions, right greater than left, and associated consolidation in the lower lobes.  Thoracic aorta atherosclerotic, unchanged.  Hilar and mediastinal contours otherwise unremarkable.  Osteopenia and degenerative changes throughout the thoracic spine.  Thoracolumbar scoliosis convex left.  IMPRESSION:  1.  Right upper lobe pneumonia. 2.  Bilateral pleural effusions, right greater than left, and associated passive atelectasis versus pneumonia in the lower lobes. 3.  Stable moderate cardiomegaly.  Pulmonary venous hypertension without overt edema.   Original Report Authenticated By: Arnell Sieving, M.D.     Scheduled Meds:   . allopurinol  100 mg Oral Daily  . amiodarone  200 mg Oral BID  . atorvastatin  40 mg Oral Daily  . aztreonam  1 g Intravenous Q8H  . diltiazem  240 mg Oral Daily  . Fluticasone-Salmeterol  1 puff Inhalation BID  . furosemide  40 mg Oral Daily  . influenza  inactive virus vaccine  0.5 mL Intramuscular Once  . levofloxacin (LEVAQUIN) IV  750 mg Intravenous Q48H  . pneumococcal 23 valent vaccine  0.5 mL Intramuscular Once  . raloxifene  60 mg Oral Daily  . Rivaroxaban  20 mg Oral Daily  . saccharomyces boulardii  250 mg Oral BID  . sodium chloride  3 mL  Intravenous Q12H  . vancomycin  125 mg Oral Q6H  . vancomycin  750 mg Intravenous Q24H   Continuous Infusions:     Time spent: 30 minutes    Eduar Kumpf  [facility] Pager [phone] If 8PM-8AM, please contact night-coverage at [email or web address], password [facility] 11/20/2011, 9:53 AM  LOS: 2 days

## 2011-11-21 DIAGNOSIS — N189 Chronic kidney disease, unspecified: Secondary | ICD-10-CM

## 2011-11-21 LAB — LEGIONELLA ANTIGEN, URINE: Legionella Antigen, Urine: NEGATIVE

## 2011-11-21 MED ORDER — RIVAROXABAN 15 MG PO TABS: 15.0000 mg | ORAL_TABLET | Freq: Every day | ORAL | Status: DC

## 2011-11-21 MED ORDER — LEVOFLOXACIN 750 MG PO TABS: 750.0000 mg | ORAL_TABLET | Freq: Every day | ORAL | Status: DC

## 2011-11-21 NOTE — Discharge Summary (Addendum)
Physician Discharge Summary  [name] [name] ZOX:[id number] DOB: [birth year] DOA: 11/18/2011  PCP: [name], MD  Admit date: 11/18/2011 Discharge date: 11/21/2011  Recommendations for Outpatient Follow-up:  1. Home with home health 2. Follow up with PCP in 1 week. Repeat BMET in 1 week. Please check repeat CXR in 4 weeks to follow resolution of infiltrate.  Discharge Diagnoses:  Principal Problem:  *Healthcare-associated pneumonia  Active Problems:  Atrial fibrillation  C. difficile colitis  Anemia due to chronic illness  CAD (coronary artery disease)  CKD (chronic kidney disease)   Discharge Condition: fair  Diet recommendation: cardiac  Filed Weights   11/19/11 0216 11/20/11 0630 11/21/11 0500  Weight: 77.8 kg (171 lb 8.3 oz) 77.157 kg (170 lb 1.6 oz) 79.3 kg (174 lb 13.2 oz)    History of present illness:  Please review admission H&P from 9/14 for details , but in brief, 78 Y/O female with hx of HTN, CAD, Afib with recent hospitalization for c diff relapse returns with cough and chills with findings of  b/l lung infiltrate consistent with HCAP.  Hospital Course:    Assessment/Plan:   Principal Problem:  *Healthcare-associated pneumonia  -patient started on IV vanco, aztreonam and Levaquin for broad coverage. ( day 3). cx so far negative. urine for strep and  legionella negative -nebs prn. Cont advair  Tylenol prn for fever.  -clinically improved and can be discharged home on po levaquin to complete a 7 day course. Needs follow up CXR in 4-6 week.    Active Problems:  Atrial fibrillation  Rate controlled. Continue cardizem ,and amiodarone . On xeralto. i have reduced her daily dose from 20 mg to 15 mg po given her GFR <50.  cdiff completed her 2 weeks course of po vancomycin . Recent hospitalization for relapse.  No further diarrhea. Cont florostar   COPD:  cont nebs, advair . o2 via [facility]   Hx of  CHF  Last EF of 45% in July . Follow up TEE with normal EF of  55% . Marland Kitchen Currently euvolemic but has some bibasilar crackles. Cont lasix .  Hyperlipidemia  cont lipitor and fish oil   Low BP:  Noted on 9/15 of SBP in 9=80s, BP meds held. Now remains stable. Can resume home BP meds.  Anemia of chronic disease:  stable   CKD: mild and stable . Follow as outpt   Procedures:  none  Consultations:  none  Discharge Exam: Filed Vitals:   11/21/11 0234 11/21/11 0500 11/21/11 0655 11/21/11 0921  BP: 105/62  117/60   Pulse: 104  103   Temp: 97.5 F (36.4 C)  97.5 F (36.4 C)   TempSrc: Oral  Oral   Resp: 16  16   Height:      Weight:  79.3 kg (174 lb 13.2 oz)    SpO2: 95%  95% 92%    General: Elderly female in NAD  HEENT: no pallor, moist oral mucosa  Cardiovascular: s1 &s2 irregular, no murmurs  Respiratory: fine bibasilar crackles  Abdomen: soft, NT ND BS+  EXT: Warm, no edema CNS: AAOX3   Discharge Instructions  Discharge Orders    Future Appointments: Provider: Department: Dept Phone: Center:   11/25/2011 10:40 AM [facility] [phone] [facility]   11/30/2011 2:30 PM Kathleene Hazel, MD [facility] [phone] [facility]       Medication List     As of 11/21/2011 10:21 AM    STOP taking these medications  vancomycin 50 mg/mL oral solution   Commonly known as: VANCOCIN      TAKE these medications         allopurinol 100 MG tablet   Commonly known as: ZYLOPRIM   Take 100 mg by mouth daily.      amiodarone 200 MG tablet   Commonly known as: PACERONE   Take 200 mg by mouth 2 (two) times daily.      atorvastatin 40 MG tablet   Commonly known as: LIPITOR   Take 40 mg by mouth daily.      diltiazem 240 MG 24 hr capsule   Commonly known as: CARDIZEM CD   Take 1 capsule (240 mg total) by mouth daily.      fish oil-omega-3 fatty acids 1000 MG capsule   Take 1 g by mouth 2 (two) times daily.      Fluticasone-Salmeterol 250-50 MCG/DOSE Aepb   Commonly known  as: ADVAIR   Inhale 1 puff into the lungs 2 (two) times daily.      furosemide 40 MG tablet   Commonly known as: LASIX   Take 40 mg by mouth daily.      levalbuterol 45 MCG/ACT inhaler   Commonly known as: XOPENEX HFA   Inhale 1-2 puffs into the lungs every 4 (four) hours as needed for wheezing.      levofloxacin 750 MG tablet   Commonly known as: LEVAQUIN   Take 1 tablet (750 mg total) by mouth daily.      raloxifene 60 MG tablet   Commonly known as: EVISTA   Take 60 mg by mouth daily.      Rivaroxaban 15 MG Tabs   Commonly known as: XARELTO   Take 15 mg by mouth daily.      saccharomyces boulardii 250 MG capsule   Commonly known as: FLORASTOR   Take 250 mg by mouth 2 (two) times daily.           Follow-up Information    Follow up with [facility], MD. In 1 week.   Contact information:   [address] [place] [address] [phone]           The results of significant diagnostics from this hospitalization (including imaging, microbiology, ancillary and laboratory) are listed below for reference.    Significant Diagnostic Studies: Ct Abdomen Pelvis Wo Contrast  11/03/2011  *RADIOLOGY REPORT*  Clinical Data: Diarrhea, weakness.  CT ABDOMEN AND PELVIS WITHOUT CONTRAST  Technique:  Multidetector CT imaging of the abdomen and pelvis was performed following the standard protocol without intravenous contrast.  Comparison: 11/02/2011 radiograph, 08/31/2011 CT  Findings: Cardiomegaly.  Low attenuation of the blood pool suggests anemia.  Aortic valve, mitral annular, and coronary artery calcifications.  Bilateral pleural effusions with associated compressive atelectasis.  Trace pericardial effusion.  Organ abnormality/lesion detection is limited in the absence of intravenous contrast. Within this limitation, unremarkable liver, biliary system, spleen, pancreas, adrenal glands.  Lobular renal contours.  No hydronephrosis or hydroureter.  Circumferential rectosigmoid wall  thickening with mild pericolonic fat stranding.  Normal appendix.  No free intraperitoneal air. Small amount of free fluid.  No lymphadenopathy. Small duodenal diverticulum.  Advanced atherosclerosis of the aorta and branch vessels.  Further vascular evaluation is not possible in the absence of intravenous contrast.  Circumaortic left renal vein.  Decompressed bladder.  Nonspecific CT appearance to the uterus and adnexa.  Multilevel degenerative changes.  No acute osseous finding.  IMPRESSION: Circumferential rectosigmoid colonic wall thickening with mild pericolonic  fat stranding. In keeping with a nonspecific colitis; infectious, inflammatory, and ischemic considerations. Pseudomembranous colitis also on the differential. The more proximal colon has a normal appearance.  Small amount of free fluid is nonspecific.  Small bilateral pleural effusions with associated atelectasis.  Cardiomegaly and trace pericardial fluid.  Coronary artery calcification.   Original Report Authenticated By: Waneta Martins, M.D.    Dg Chest 2 View  11/18/2011  *RADIOLOGY REPORT*  Clinical Data: Cough.  Shortness of breath.  Chest tightness. History of hypertension and COPD.  CHEST - 2 VIEW  Comparison: Two-view chest x-ray 10/31/2011, 09/28/2011, 02/04/2009.  Findings: Cardiac silhouette moderately enlarged but stable. Pulmonary venous hypertension without overt edema.  Airspace consolidation in the right upper lobe.  Bilateral pleural effusions, right greater than left, and associated consolidation in the lower lobes.  Thoracic aorta atherosclerotic, unchanged.  Hilar and mediastinal contours otherwise unremarkable.  Osteopenia and degenerative changes throughout the thoracic spine.  Thoracolumbar scoliosis convex left.  IMPRESSION:  1.  Right upper lobe pneumonia. 2.  Bilateral pleural effusions, right greater than left, and associated passive atelectasis versus pneumonia in the lower lobes. 3.  Stable moderate cardiomegaly.   Pulmonary venous hypertension without overt edema.   Original Report Authenticated By: Arnell Sieving, M.D.    Dg Chest 2 View  10/31/2011  *RADIOLOGY REPORT*  Clinical Data: Cough and congestion  CHEST - 2 VIEW  Comparison: 10/03/2011  Findings: The cardiac shadow is again mildly enlarged.  Previously seen right-sided PICC line has been removed in the interval.  The lungs are well-aerated bilaterally with evidence of right basilar infiltrate and likely small effusion.  IMPRESSION: Right basilar infiltrate.   Original Report Authenticated By: Phillips Odor, M.D.    Dg Abd Acute W/chest  11/02/2011  *RADIOLOGY REPORT*  Clinical Data: Abdominal pain.  Recent surgery.  Nausea.  ACUTE ABDOMEN SERIES (ABDOMEN 2 VIEW & CHEST 1 VIEW)  Comparison: Two-view chest 10/31/2011.  CT abdomen pelvis 08/31/2011.  Findings: The heart is enlarged.  Bilateral pleural effusions are worse right than left.  Bibasilar airspace disease persists.  Supine and upright views the abdomen demonstrate a nonspecific bowel gas pattern.  There is no evidence for obstruction or free air.  Rightward scoliosis of the lumbar spine is stable.  IMPRESSION:  1.  Cardiomegaly and bilateral pleural effusions. 2.  Mild bibasilar airspace disease likely reflects atelectasis. 3.  No acute abnormality of the abdomen. 4.  Scoliosis.   Original Report Authenticated By: Jamesetta Orleans. MATTERN, M.D.     Microbiology: Recent Results (from the past 240 hour(s))  MRSA PCR SCREENING     Status: Normal   Collection Time   11/19/11  2:32 PM      Component Value Range Status Comment   MRSA by PCR NEGATIVE  NEGATIVE Final   CULTURE, EXPECTORATED SPUTUM-ASSESSMENT     Status: Normal   Collection Time   11/19/11  8:37 PM      Component Value Range Status Comment   Specimen Description SPUTUM   Final    Special Requests Normal   Final    Sputum evaluation     Final    Value: THIS SPECIMEN IS ACCEPTABLE. RESPIRATORY CULTURE REPORT TO FOLLOW.   Report  Status 11/19/2011 FINAL   Final      Labs: Basic Metabolic Panel:  Lab 11/19/11 9604 11/18/11 2210 11/18/11 1257  NA 138 138 138  K 4.3 4.0 4.1  CL 102 99 101  CO2 30 30 29  GLUCOSE 95 114* 93  BUN 11 13 13   CREATININE 1.33* 1.34* 1.4*  CALCIUM 8.7 9.0 8.8  MG -- -- --  PHOS -- -- --   Liver Function Tests:  Lab 11/19/11 0535 11/18/11 2210  AST 16 14  ALT 11 11  ALKPHOS 63 70  BILITOT 0.4 0.5  PROT 5.9* 6.3  ALBUMIN 2.6* 3.0*   No results found for this basename: LIPASE:5,AMYLASE:5 in the last 168 hours No results found for this basename: AMMONIA:5 in the last 168 hours CBC:  Lab 11/19/11 0535 11/18/11 2210  WBC 4.6 7.3  NEUTROABS -- 5.4  HGB 8.9* 9.5*  HCT 28.4* 29.9*  MCV 94.7 93.4  PLT 247 281   Cardiac Enzymes:  Lab 11/19/11 1705 11/19/11 1145 11/19/11 0535  CKTOTAL -- -- --  CKMB -- -- --  CKMBINDEX -- -- --  TROPONINI <0.30 <0.30 <0.30   BNP: BNP (last 3 results)  Basename 10/01/11 0718 09/29/11 0008 09/28/11 2200  PROBNP 1563.0* 2283.0* 2255.0*   CBG: No results found for this basename: GLUCAP:5 in the last 168 hours  Time coordinating discharge: 45  minutes  Signed:  Markelle Asaro  [facility] 11/21/2011, 10:21 AM

## 2011-11-21 NOTE — Clinical Documentation Improvement (Signed)
CKD DOCUMENTATION CLARIFICATION QUERY   THIS DOCUMENT IS NOT A PERMANENT PART OF THE MEDICAL RECORD  TO RESPOND TO THE THIS QUERY, FOLLOW THE INSTRUCTIONS BELOW:  1. If needed, update documentation for the patient's encounter via the notes activity.  2. Access this query again and click edit on the In [employer].  3. After updating, or not, click F2 to complete all highlighted (required) fields concerning your review. Select "additional documentation in the medical record" OR "no additional documentation provided".  4. Click Sign note button.  5. The deficiency will fall out of your In Basket *Please let us know if you are not able to complete this workflow by phone or e-mail (listed below).  Please update your documentation within the medical record to reflect your response to this query.                                                                                        11/21/11   Dear Dr. [facility],  In a better effort to capture your patient's severity of illness, reflect appropriate length of stay and utilization of resources, a review of the patient medical record has revealed the following indicators.    Based on your clinical judgment, please clarify and document in a progress note and/or discharge summary the clinical condition associated with the following supporting information:  In responding to this query please exercise your independent judgment.  The fact that a query is asked, does not imply that any particular answer is desired or expected. 11/20/11 progr note..."CKD: mild and stable." For accurate DX specificity & severity can noted "CKD" be further specified with stage. Thank you   Possible Clinical Conditions?  CKD Stage I - GFR =90 CKD Stage II - GFR 60-80 CKD Stage III - GFR 30-59 CKD Stage IV - GFR 15-29 CKD Stage V - GFR < 15 ESRD (End Stage Renal Disease)  Other Condition (please specify) Cannot Clinically Determine  Supporting  Information: Risk Factors: 11/20/11 progr note..."CKD: mild and stable   Signs & Symptoms: see above note  Diagnostics: Lab:       11/19/11            11/18/11           11/18/11  BUN 11                     13                   13   Creat: 1.33*             1.34*                1.4*  Calc GFR:35 (L)        35 (L)   Urine:  Treatment:  11/18/12 0.9 % sodium chloride infusion prn; cont to trend/mon labs  You may use possible, probable, or suspect with inpatient documentation. possible, probable, suspected diagnoses MUST be documented at the time of discharge  Reviewed: additional documentation in the medical record;11/21/11 dc summ rev'd.orm  Thank You, Toribio Harbour, RN, BSN, CCDS Certified Clinical Documentation  Specialist Pager: [phone]  Health Information Management [place]

## 2011-11-21 NOTE — Progress Notes (Signed)
Talked to patient about DCP/ HHC; patient resides in an Independent Living Facility, [facility] and patient stated that she has been using [facility]; Norberta Keens RN with [facility] called for resumption of HHC as prior to admission; B Ave Filter RN,BSN,MHA

## 2011-11-21 NOTE — Clinical Documentation Improvement (Signed)
CHF DOCUMENTATION CLARIFICATION QUERY  THIS DOCUMENT IS NOT A PERMANENT PART OF THE MEDICAL RECORD  TO RESPOND TO THE THIS QUERY, FOLLOW THE INSTRUCTIONS BELOW:  1. If needed, update documentation for the patient's encounter via the notes activity.  2. Access this query again and click edit on the In [employer].  3. After updating, or not, click F2 to complete all highlighted (required) fields concerning your review. Select "additional documentation in the medical record" OR "no additional documentation provided".  4. Click Sign note button.  5. The deficiency will fall out of your In Basket *Please let us know if you are not able to complete this workflow by phone or e-mail (listed below).  Please update your documentation within the medical record to reflect your response to this query.                                                                                    11/21/11  Dear Dr.N [facility],  In a better effort to capture your patient's severity of illness, reflect appropriate length of stay and utilization of resources, a review of the patient medical record has revealed the following indicators the diagnosis of Heart Failure.    Based on your clinical judgment, please clarify and document in a progress note and/or discharge summary the clinical condition associated with the following supporting information:  In responding to this query please exercise your independent judgment.  The fact that a query is asked, does not imply that any particular answer is desired or expected. 11/20/11 prog note..."CHF..." For accurate DX specificity & severity can noted "CHF" be further specified with type & acuity. Thank you  Possible Clinical Conditions? CHF Left Heart Failure Systolic or Diastolic Congestive Heart Failure Systolic & Diastolic Congestive Heart Failure  Chronic Systolic or Diastolic Congestive Heart Failure Chronic Systolic & Diastolic Congestive Heart  Failure Acute Systolic or Diastolic Congestive Heart Failure Acute Systolic & Diastolic Congestive Heart Failure  Acute on Chronic Systolic or Diastolic  Congestive Heart Failure Acute on Chronic Systolic & Diastolic  Congestive Heart Failure  Other Condition Cannot Clinically Determine  Supporting Information: Risk Factors: 11/20/11 Progr Note..."CHF-Last EF of 45% in July . Currently euvolemic but has some bibasilar crackles. Cont lasix".  Signs & Symptoms: see see above note  Diagnostics: Basename 10/01/11 0718              09/29/11 0008              09/28/11 2200  PROBNP         1563.0*                         2283.0*                         2255.0*   Echo results: see above note EKG:  Radiology: 11/18/11: CXR: IMPRESSION: 1. Right upper lobe pneumonia. 2. Bilateral pleural effusions, right greater than left, and associated passive atelectasis versus pneumonia in the lower lobes. 3. Stable moderate cardiomegaly. Pulmonary venous hypertension without overt edema.   Treatment: see above note  Reviewed:  no additional documentation provided: 11/21/11 dc summ rev'd. orm  Thank You,  Toribio Harbour, RN, BSN, CCDS Certified Clinical Documentation Specialist Pager: [phone]  Health Information Management

## 2011-11-22 LAB — CULTURE, RESPIRATORY W GRAM STAIN

## 2011-11-23 ENCOUNTER — Telehealth: Payer: Self-pay | Admitting: *Deleted

## 2011-11-23 NOTE — Telephone Encounter (Signed)
Spoke with pt's daughter and let her know BMP would be checked at office visit with Dr. Clifton James on November 30, 2011 and that [facility] previously planned for Sept. 20, 2013 could be cancelled.

## 2011-11-25 ENCOUNTER — Other Ambulatory Visit: Payer: Medicare Other

## 2011-11-28 ENCOUNTER — Inpatient Hospital Stay (HOSPITAL_COMMUNITY)
Admission: AD | Admit: 2011-11-28 | Discharge: 2011-12-03 | DRG: 378 | Disposition: A | Discharge status: Home / Self Care | Payer: Medicare Other | Source: Ambulatory Visit | Attending: Internal Medicine | Admitting: Internal Medicine

## 2011-11-28 ENCOUNTER — Encounter (HOSPITAL_COMMUNITY): Payer: Self-pay

## 2011-11-28 DIAGNOSIS — I129 Hypertensive chronic kidney disease with stage 1 through stage 4 chronic kidney disease, or unspecified chronic kidney disease: Secondary | ICD-10-CM | POA: Diagnosis present

## 2011-11-28 DIAGNOSIS — I4891 Unspecified atrial fibrillation: Secondary | ICD-10-CM

## 2011-11-28 DIAGNOSIS — K921 Melena: Principal | ICD-10-CM | POA: Diagnosis present

## 2011-11-28 DIAGNOSIS — T45515A Adverse effect of anticoagulants, initial encounter: Secondary | ICD-10-CM | POA: Diagnosis present

## 2011-11-28 DIAGNOSIS — B3781 Candidal esophagitis: Secondary | ICD-10-CM

## 2011-11-28 DIAGNOSIS — Z79899 Other long term (current) drug therapy: Secondary | ICD-10-CM

## 2011-11-28 DIAGNOSIS — M810 Age-related osteoporosis without current pathological fracture: Secondary | ICD-10-CM | POA: Diagnosis present

## 2011-11-28 DIAGNOSIS — M109 Gout, unspecified: Secondary | ICD-10-CM | POA: Diagnosis present

## 2011-11-28 DIAGNOSIS — G8929 Other chronic pain: Secondary | ICD-10-CM | POA: Diagnosis present

## 2011-11-28 DIAGNOSIS — I5042 Chronic combined systolic (congestive) and diastolic (congestive) heart failure: Secondary | ICD-10-CM | POA: Diagnosis present

## 2011-11-28 DIAGNOSIS — D62 Acute posthemorrhagic anemia: Secondary | ICD-10-CM

## 2011-11-28 DIAGNOSIS — D638 Anemia in other chronic diseases classified elsewhere: Secondary | ICD-10-CM

## 2011-11-28 DIAGNOSIS — N183 Chronic kidney disease, stage 3 unspecified: Secondary | ICD-10-CM | POA: Diagnosis present

## 2011-11-28 DIAGNOSIS — A0472 Enterocolitis due to Clostridium difficile, not specified as recurrent: Secondary | ICD-10-CM

## 2011-11-28 DIAGNOSIS — E785 Hyperlipidemia, unspecified: Secondary | ICD-10-CM | POA: Diagnosis present

## 2011-11-28 DIAGNOSIS — N189 Chronic kidney disease, unspecified: Secondary | ICD-10-CM

## 2011-11-28 DIAGNOSIS — Z96659 Presence of unspecified artificial knee joint: Secondary | ICD-10-CM

## 2011-11-28 DIAGNOSIS — J449 Chronic obstructive pulmonary disease, unspecified: Secondary | ICD-10-CM | POA: Diagnosis present

## 2011-11-28 DIAGNOSIS — M549 Dorsalgia, unspecified: Secondary | ICD-10-CM | POA: Diagnosis present

## 2011-11-28 DIAGNOSIS — Z880 Allergy status to penicillin: Secondary | ICD-10-CM

## 2011-11-28 DIAGNOSIS — J4489 Other specified chronic obstructive pulmonary disease: Secondary | ICD-10-CM | POA: Diagnosis present

## 2011-11-28 DIAGNOSIS — R0902 Hypoxemia: Secondary | ICD-10-CM | POA: Diagnosis not present

## 2011-11-28 DIAGNOSIS — I509 Heart failure, unspecified: Secondary | ICD-10-CM

## 2011-11-28 DIAGNOSIS — M129 Arthropathy, unspecified: Secondary | ICD-10-CM | POA: Diagnosis present

## 2011-11-28 DIAGNOSIS — Z87891 Personal history of nicotine dependence: Secondary | ICD-10-CM

## 2011-11-28 DIAGNOSIS — Z885 Allergy status to narcotic agent status: Secondary | ICD-10-CM

## 2011-11-28 DIAGNOSIS — Z792 Long term (current) use of antibiotics: Secondary | ICD-10-CM

## 2011-11-28 DIAGNOSIS — Y92009 Unspecified place in unspecified non-institutional (private) residence as the place of occurrence of the external cause: Secondary | ICD-10-CM

## 2011-11-28 DIAGNOSIS — I2510 Atherosclerotic heart disease of native coronary artery without angina pectoris: Secondary | ICD-10-CM | POA: Diagnosis present

## 2011-11-28 DIAGNOSIS — K922 Gastrointestinal hemorrhage, unspecified: Secondary | ICD-10-CM

## 2011-11-28 DIAGNOSIS — Z8701 Personal history of pneumonia (recurrent): Secondary | ICD-10-CM

## 2011-11-28 DIAGNOSIS — I5032 Chronic diastolic (congestive) heart failure: Secondary | ICD-10-CM

## 2011-11-28 LAB — COMPREHENSIVE METABOLIC PANEL
BUN: 21 mg/dL (ref 6–23)
CO2: 30 mEq/L (ref 19–32)
Calcium: 9.1 mg/dL (ref 8.4–10.5)
Chloride: 103 mEq/L (ref 96–112)
Creatinine, Ser: 1.26 mg/dL — ABNORMAL HIGH (ref 0.50–1.10)
GFR calc Af Amer: 43 mL/min — ABNORMAL LOW (ref >90)
GFR calc non Af Amer: 37 mL/min — ABNORMAL LOW (ref >90)
Total Bilirubin: 0.5 mg/dL (ref 0.3–1.2)

## 2011-11-28 LAB — APTT: aPTT: 53 seconds — ABNORMAL HIGH (ref 24–37)

## 2011-11-28 LAB — CBC
MCHC: 31.5 g/dL (ref 30.0–36.0)
Platelets: 210 10*3/uL (ref 150–400)
RDW: 15.6 % — ABNORMAL HIGH (ref 11.5–15.5)

## 2011-11-28 LAB — DIFFERENTIAL
Basophils Absolute: 0 10*3/uL (ref 0.0–0.1)
Basophils Relative: 1 % (ref 0–1)
Monocytes Absolute: 0.5 10*3/uL (ref 0.1–1.0)
Neutro Abs: 5.2 10*3/uL (ref 1.7–7.7)
Neutrophils Relative %: 78 % — ABNORMAL HIGH (ref 43–77)

## 2011-11-28 LAB — PHOSPHORUS: Phosphorus: 3.6 mg/dL (ref 2.3–4.6)

## 2011-11-28 LAB — PROTIME-INR: INR: 2.87 — ABNORMAL HIGH (ref 0.00–1.49)

## 2011-11-28 LAB — MAGNESIUM: Magnesium: 2.2 mg/dL (ref 1.5–2.5)

## 2011-11-28 MED ORDER — FLUTICASONE-SALMETEROL 250-50 MCG/DOSE IN AEPB
1.0000 | INHALATION_SPRAY | Freq: Two times a day (BID) | RESPIRATORY_TRACT | Status: DC
  Administered 2011-11-28 – 2011-12-03 (×10): 1 via RESPIRATORY_TRACT
  Filled 2011-11-28: qty 14

## 2011-11-28 MED ORDER — RALOXIFENE HCL 60 MG PO TABS
60.0000 mg | ORAL_TABLET | Freq: Every day | ORAL | Status: DC
  Administered 2011-11-29 – 2011-12-03 (×5): 60 mg via ORAL
  Filled 2011-11-28 (×5): qty 1

## 2011-11-28 MED ORDER — ALLOPURINOL 100 MG PO TABS
100.0000 mg | ORAL_TABLET | Freq: Every day | ORAL | Status: DC
  Administered 2011-11-29 – 2011-12-03 (×5): 100 mg via ORAL
  Filled 2011-11-28 (×5): qty 1

## 2011-11-28 MED ORDER — HYDROCODONE-ACETAMINOPHEN 5-325 MG PO TABS: 1.0000 | ORAL_TABLET | ORAL | Status: DC | PRN

## 2011-11-28 MED ORDER — OMEGA-3-ACID ETHYL ESTERS 1 G PO CAPS
1.0000 g | ORAL_CAPSULE | Freq: Two times a day (BID) | ORAL | Status: DC
  Administered 2011-11-28 – 2011-12-03 (×10): 1 g via ORAL
  Filled 2011-11-28 (×11): qty 1

## 2011-11-28 MED ORDER — SODIUM CHLORIDE 0.9 % IV SOLN
INTRAVENOUS | Status: AC
  Administered 2011-11-28: 50 mL/h via INTRAVENOUS

## 2011-11-28 MED ORDER — ONDANSETRON HCL 4 MG/2ML IJ SOLN: 4.0000 mg | Freq: Four times a day (QID) | INTRAMUSCULAR | Status: DC | PRN

## 2011-11-28 MED ORDER — ATORVASTATIN CALCIUM 40 MG PO TABS
40.0000 mg | ORAL_TABLET | Freq: Every day | ORAL | Status: DC
  Administered 2011-11-28 – 2011-12-03 (×6): 40 mg via ORAL
  Filled 2011-11-28 (×6): qty 1

## 2011-11-28 MED ORDER — SACCHAROMYCES BOULARDII 250 MG PO CAPS
250.0000 mg | ORAL_CAPSULE | Freq: Two times a day (BID) | ORAL | Status: DC
  Administered 2011-11-28 – 2011-12-03 (×10): 250 mg via ORAL
  Filled 2011-11-28 (×11): qty 1

## 2011-11-28 MED ORDER — ACETAMINOPHEN 325 MG PO TABS: 650.0000 mg | ORAL_TABLET | Freq: Four times a day (QID) | ORAL | Status: DC | PRN

## 2011-11-28 MED ORDER — SODIUM CHLORIDE 0.9 % IJ SOLN
3.0000 mL | Freq: Two times a day (BID) | INTRAMUSCULAR | Status: DC
  Administered 2011-11-29 – 2011-12-03 (×8): 3 mL via INTRAVENOUS

## 2011-11-28 MED ORDER — OMEGA-3 FATTY ACIDS 1000 MG PO CAPS: 1.0000 g | ORAL_CAPSULE | Freq: Two times a day (BID) | ORAL | Status: DC

## 2011-11-28 MED ORDER — IPRATROPIUM BROMIDE 0.02 % IN SOLN: 0.5000 mg | RESPIRATORY_TRACT | Status: DC | PRN

## 2011-11-28 MED ORDER — LEVALBUTEROL TARTRATE 45 MCG/ACT IN AERO
1.0000 | INHALATION_SPRAY | RESPIRATORY_TRACT | Status: DC | PRN
  Filled 2011-11-28: qty 15

## 2011-11-28 MED ORDER — DILTIAZEM HCL ER COATED BEADS 240 MG PO CP24
240.0000 mg | ORAL_CAPSULE | Freq: Every day | ORAL | Status: DC
  Administered 2011-11-29 – 2011-12-03 (×5): 240 mg via ORAL
  Filled 2011-11-28 (×5): qty 1

## 2011-11-28 MED ORDER — ONDANSETRON HCL 4 MG PO TABS: 4.0000 mg | ORAL_TABLET | Freq: Four times a day (QID) | ORAL | Status: DC | PRN

## 2011-11-28 MED ORDER — ACETAMINOPHEN 650 MG RE SUPP: 650.0000 mg | Freq: Four times a day (QID) | RECTAL | Status: DC | PRN

## 2011-11-28 MED ORDER — AMIODARONE HCL 200 MG PO TABS
200.0000 mg | ORAL_TABLET | Freq: Two times a day (BID) | ORAL | Status: DC
  Administered 2011-11-28 – 2011-12-03 (×10): 200 mg via ORAL
  Filled 2011-11-28 (×11): qty 1

## 2011-11-28 MED ORDER — FUROSEMIDE 40 MG PO TABS
40.0000 mg | ORAL_TABLET | Freq: Every day | ORAL | Status: DC
  Administered 2011-11-29 – 2011-12-01 (×3): 40 mg via ORAL
  Filled 2011-11-28 (×4): qty 1

## 2011-11-28 MED ORDER — ALBUTEROL SULFATE (5 MG/ML) 0.5% IN NEBU
2.5000 mg | INHALATION_SOLUTION | RESPIRATORY_TRACT | Status: DC | PRN
  Administered 2011-11-29: 2.5 mg via RESPIRATORY_TRACT

## 2011-11-28 NOTE — H&P (Signed)
[facility] History and Physical  OKLA QAZI ZOX:[id number] DOB: [birth year] DOA: 11/28/2011  Referring physician: ER physician PCP: Carilyn Goodpasture, PA   Chief Complaint: dark stool  HPI:  [name] [name] is 78 year old pleasant female with an extensive medical history including COPD, CHF, C. Diff colitis, recently diagnoses atrial fibrillation (on xarelto) who was at her PCP today for regular follow up visit and was complaining of dark stool for about 1 week prior to this admission. Patient reports no associated nausea or vomiting, no abdominal pain. No visible blood in stool. No complaints of chest pain, no shortness of breath, no fever or chills.   Assessment and Plan:  Principal problem: Upper GI bleed - likely due to xarelto - we will hold xarelto - follow up on all admission labs; patient directly admitted from PCP office with no ED work up available - GI consult called for possible EGD in am - we will restart other home medications  Disposition: to telemetry  Manson Passey [facility] Pager [phone]  Review of Systems:  Constitutional: Negative for fever, chills and malaise/fatigue. Negative for diaphoresis.  HENT: Negative for hearing loss, ear pain, nosebleeds, congestion, sore throat, neck pain, tinnitus and ear discharge.   Eyes: Negative for blurred vision, double vision, photophobia, pain, discharge and redness.  Respiratory: Negative for cough, hemoptysis, sputum production, shortness of breath, wheezing and stridor.   Cardiovascular: Negative for chest pain, palpitations, orthopnea, claudication and leg swelling.  Gastrointestinal: dark stool, no nausea or vomiting.  Genitourinary: Negative for dysuria, urgency, frequency, hematuria and flank pain.  Musculoskeletal: Negative for myalgias, back pain, joint pain and falls.  Skin: Negative for itching and rash.  Neurological: Negative for dizziness and weakness. Negative for tingling, tremors, sensory change, speech  change, focal weakness, loss of consciousness and headaches.  Endo/Heme/Allergies: Negative for environmental allergies and polydipsia. Does not bruise/bleed easily.  Psychiatric/Behavioral: Negative for suicidal ideas. The patient is not nervous/anxious.      Past Medical History  Diagnosis Date  . Hypertension   . Hyperlipidemia   . COPD (chronic obstructive pulmonary disease)   . Osteoporosis   . Arthritis   . H/O hiatal hernia   . Chronic back pain   . CAD (coronary artery disease)     Cath 2005 with moderate CAD, Dr. [place]  . Atrial fibrillation   . C. difficile diarrhea   . Gout    Past Surgical History  Procedure Date  . Carotid endarterectomy     bilateral  . Cataract extraction, bilateral   . Joint replacement 2008    Right Total Knee  . Anal fistula repair   . Left parotidectomy   . Eye surgery   . Flexible sigmoidoscopy 09/02/2011    Procedure: FLEXIBLE SIGMOIDOSCOPY;  Surgeon: Theda Belfast, MD;  Location: [facility] ENDOSCOPY;  Service: Endoscopy;  Laterality: N/A;  . Flexible sigmoidoscopy 09/03/2011    Procedure: FLEXIBLE SIGMOIDOSCOPY;  Surgeon: Theda Belfast, MD;  Location: [facility] ENDOSCOPY;  Service: Endoscopy;  Laterality: N/A;  . Tee without cardioversion 10/04/2011    Procedure: TRANSESOPHAGEAL ECHOCARDIOGRAM (TEE);  Surgeon: Laurey Morale, MD;  Location: [facility] ENDOSCOPY;  Service: Cardiovascular;  Laterality: N/A;  to be carelinked here by 1230-verified 7/29/dl  . Cardioversion 10/04/2011    Procedure: CARDIOVERSION;  Surgeon: Laurey Morale, MD;  Location: [facility] ENDOSCOPY;  Service: Cardiovascular;  Laterality: N/A;   Social History:  reports that she quit smoking about 28 years ago. Her smoking use included Cigarettes. She has a 20  pack-year smoking history. She has never used smokeless tobacco. She reports that she does not drink alcohol or use illicit drugs.  Allergies  Allergen Reactions  . Baby Powder (Methylbenzethonium) Shortness Of Breath  . Demerol  (Meperidine) Hives  . Penicillins Hives    Family History: hyperlipidemia in daughter  Prior to Admission medications   Medication Sig Start Date End Date Taking? Authorizing Provider  allopurinol (ZYLOPRIM) 100 MG tablet Take 100 mg by mouth daily.    Yes Historical Provider, MD  amiodarone (PACERONE) 200 MG tablet Take 200 mg by mouth 2 (two) times daily.   Yes Historical Provider, MD  atorvastatin (LIPITOR) 40 MG tablet Take 40 mg by mouth daily.   Yes Historical Provider, MD  diltiazem (CARDIZEM CD) 240 MG 24 hr capsule Take 1 capsule (240 mg total) by mouth daily. 09/15/11 09/14/12 Yes Adeline Joselyn Glassman, MD  fish oil-omega-3 fatty acids 1000 MG capsule Take 1 g by mouth 2 (two) times daily.    Yes Historical Provider, MD  Fluticasone-Salmeterol (ADVAIR) 250-50 MCG/DOSE AEPB Inhale 1 puff into the lungs 2 (two) times daily. 09/15/11  Yes Adeline C Viyuoh, MD  furosemide (LASIX) 40 MG tablet Take 40 mg by mouth daily.   Yes Historical Provider, MD  levalbuterol [facility] HFA) 45 MCG/ACT inhaler Inhale 1-2 puffs into the lungs every 4 (four) hours as needed for wheezing. 09/15/11 09/14/12 Yes Adeline Joselyn Glassman, MD  raloxifene (EVISTA) 60 MG tablet Take 60 mg by mouth daily.   Yes Historical Provider, MD  Rivaroxaban (XARELTO) 15 MG TABS tablet Take 1 tablet (15 mg total) by mouth daily. 11/21/11  Yes Nishant Dhungel, MD  saccharomyces boulardii (FLORASTOR) 250 MG capsule Take 250 mg by mouth 2 (two) times daily.   Yes Historical Provider, MD  levofloxacin (LEVAQUIN) 750 MG tablet Take 1 tablet (750 mg total) by mouth daily. 11/21/11   Nishant Dhungel, MD   Physical Exam: There were no vitals filed for this visit.  Physical Exam  Constitutional: Appears well-developed and well-nourished. No distress.  HENT: Normocephalic. External right and left ear normal. Oropharynx is clear and moist.  Eyes: Conjunctivae and EOM are normal. PERRLA, no scleral icterus.  Neck: Normal ROM. Neck supple. No JVD. No  tracheal deviation. No thyromegaly.  CVS: irregular rhythm, rate controlled, S1/S2 +, no murmurs, no gallops, no carotid bruit.  Pulmonary: Effort and breath sounds normal, no stridor, rhonchi, wheezes, rales.  Abdominal: Soft. BS +,  no distension, tenderness, rebound or guarding.  Musculoskeletal: Normal range of motion. (+2) LE pitting edema and no tenderness.  Lymphadenopathy: No lymphadenopathy noted, cervical, inguinal. Neuro: Alert. Normal reflexes, muscle tone coordination. No cranial nerve deficit. Skin: Skin is warm and dry. No rash noted. Not diaphoretic. No erythema. No pallor.  Psychiatric: Normal mood and affect. Behavior, judgment, thought content normal.   Labs on Admission:  Basic Metabolic Panel: No results found for this basename: NA:5,K:5,CL:5,CO2:5,GLUCOSE:5,BUN:5,CREATININE:5,CALCIUM:5,MG:5,PHOS:5 in the last 168 hours Liver Function Tests: No results found for this basename: AST:5,ALT:5,ALKPHOS:5,BILITOT:5,PROT:5,ALBUMIN:5 in the last 168 hours No results found for this basename: LIPASE:5,AMYLASE:5 in the last 168 hours No results found for this basename: AMMONIA:5 in the last 168 hours CBC: No results found for this basename: WBC:5,NEUTROABS:5,HGB:5,HCT:5,MCV:5,PLT:5 in the last 168 hours Cardiac Enzymes: No results found for this basename: CKTOTAL:5,CKMB:5,CKMBINDEX:5,TROPONINI:5 in the last 168 hours BNP: No components found with this basename: POCBNP:5 CBG: No results found for this basename: GLUCAP:5 in the last 168 hours  Radiological Exams on Admission: No results  found.   Code Status: Full Family Communication: Pt at bedside Disposition Plan: Admit for further evaluation  Manson Passey, MD  [facility] Pager [phone]  If 7PM-7AM, please contact night-coverage [email or web address] Password [email or web address] 11/28/2011, 1:08 PM

## 2011-11-29 ENCOUNTER — Encounter (HOSPITAL_COMMUNITY)
Admission: AD | Disposition: A | Discharge status: Home / Self Care | Payer: Self-pay | Source: Ambulatory Visit | Attending: Internal Medicine

## 2011-11-29 ENCOUNTER — Encounter (HOSPITAL_COMMUNITY): Payer: Self-pay | Admitting: Anesthesiology

## 2011-11-29 ENCOUNTER — Encounter (HOSPITAL_COMMUNITY): Payer: Self-pay | Admitting: Gastroenterology

## 2011-11-29 ENCOUNTER — Encounter (HOSPITAL_COMMUNITY): Payer: Self-pay | Admitting: *Deleted

## 2011-11-29 ENCOUNTER — Inpatient Hospital Stay (HOSPITAL_COMMUNITY): Payer: Medicare Other | Admitting: Anesthesiology

## 2011-11-29 DIAGNOSIS — K922 Gastrointestinal hemorrhage, unspecified: Secondary | ICD-10-CM | POA: Diagnosis present

## 2011-11-29 DIAGNOSIS — D62 Acute posthemorrhagic anemia: Secondary | ICD-10-CM

## 2011-11-29 HISTORY — PX: ESOPHAGOGASTRODUODENOSCOPY: SHX5428

## 2011-11-29 LAB — COMPREHENSIVE METABOLIC PANEL
Alkaline Phosphatase: 60 U/L (ref 39–117)
BUN: 23 mg/dL (ref 6–23)
GFR calc Af Amer: 41 mL/min — ABNORMAL LOW (ref >90)
Glucose, Bld: 105 mg/dL — ABNORMAL HIGH (ref 70–99)
Potassium: 4 mEq/L (ref 3.5–5.1)
Total Bilirubin: 0.4 mg/dL (ref 0.3–1.2)
Total Protein: 5.8 g/dL — ABNORMAL LOW (ref 6.0–8.3)

## 2011-11-29 LAB — CBC
HCT: 28.4 % — ABNORMAL LOW (ref 36.0–46.0)
Platelets: 193 10*3/uL (ref 150–400)
RDW: 15.9 % — ABNORMAL HIGH (ref 11.5–15.5)
WBC: 7.1 10*3/uL (ref 4.0–10.5)

## 2011-11-29 SURGERY — ESOPHAGOGASTRODUODENOSCOPY (EGD) WITH PROPOFOL
Anesthesia: Monitor Anesthesia Care

## 2011-11-29 SURGERY — EGD (ESOPHAGOGASTRODUODENOSCOPY)
Anesthesia: Monitor Anesthesia Care

## 2011-11-29 MED ORDER — ALBUTEROL SULFATE (5 MG/ML) 0.5% IN NEBU
2.5000 mg | INHALATION_SOLUTION | Freq: Once | RESPIRATORY_TRACT | Status: DC
  Filled 2011-11-29: qty 0.5

## 2011-11-29 MED ORDER — BUTAMBEN-TETRACAINE-BENZOCAINE 2-2-14 % EX AERO
INHALATION_SPRAY | CUTANEOUS | Status: DC | PRN
  Administered 2011-11-29: 2 via TOPICAL

## 2011-11-29 MED ORDER — LACTATED RINGERS IV SOLN
INTRAVENOUS | Status: DC | PRN
  Administered 2011-11-29: 13:00:00 via INTRAVENOUS

## 2011-11-29 MED ORDER — KETAMINE HCL 10 MG/ML IJ SOLN
INTRAMUSCULAR | Status: DC | PRN
  Administered 2011-11-29: 10 mg via INTRAVENOUS

## 2011-11-29 MED ORDER — LACTATED RINGERS IV SOLN: INTRAVENOUS | Status: DC

## 2011-11-29 MED ORDER — PROMETHAZINE HCL 25 MG/ML IJ SOLN: 6.2500 mg | INTRAMUSCULAR | Status: DC | PRN

## 2011-11-29 MED ORDER — ALBUTEROL SULFATE (5 MG/ML) 0.5% IN NEBU
INHALATION_SOLUTION | RESPIRATORY_TRACT | Status: AC
  Filled 2011-11-29: qty 0.5

## 2011-11-29 MED ORDER — FENTANYL CITRATE 0.05 MG/ML IJ SOLN
INTRAMUSCULAR | Status: DC | PRN
  Administered 2011-11-29: 25 ug via INTRAVENOUS

## 2011-11-29 MED ORDER — NYSTATIN 100000 UNIT/ML MT SUSP
5.0000 mL | Freq: Four times a day (QID) | OROMUCOSAL | Status: DC
  Administered 2011-11-29 – 2011-12-01 (×7): 500000 [IU] via ORAL
  Filled 2011-11-29 (×10): qty 5

## 2011-11-29 MED ORDER — LIDOCAINE HCL (CARDIAC) 20 MG/ML IV SOLN
INTRAVENOUS | Status: DC | PRN
  Administered 2011-11-29: 60 mg via INTRAVENOUS

## 2011-11-29 MED ORDER — PROPOFOL INFUSION 10 MG/ML OPTIME
INTRAVENOUS | Status: DC | PRN
  Administered 2011-11-29: 50 ug/kg/min via INTRAVENOUS

## 2011-11-29 MED ORDER — MIDAZOLAM HCL 5 MG/5ML IJ SOLN
INTRAMUSCULAR | Status: DC | PRN
  Administered 2011-11-29: 1 mg via INTRAVENOUS

## 2011-11-29 NOTE — Transfer of Care (Signed)
Immediate Anesthesia Transfer of Care Note  Patient: Shamona Wirtz Tebbetts  Procedure(s) Performed: Procedure(s) (LRB) with comments: ESOPHAGOGASTRODUODENOSCOPY (EGD) (N/A) - recent h/o resp failure/pneumonia  Patient Location: PACU and Endoscopy Unit  Anesthesia Type: MAC  Level of Consciousness: awake, alert , oriented and patient cooperative  Airway & Oxygen Therapy: Patient Spontanous Breathing and Patient connected to face mask oxygen  Post-op Assessment: Report given to PACU RN, Post -op Vital signs reviewed and stable and Patient moving all extremities  Post vital signs: Reviewed and stable  Complications: No apparent anesthesia complications

## 2011-11-29 NOTE — Evaluation (Signed)
Physical Therapy Evaluation Patient Details Name: [name] [name] MRN: [id number] DOB: 12/06/1923 Today's Date: 11/29/2011 Time: 1478-2956 PT Time Calculation (min): 23 min  PT Assessment / Plan / Recommendation Clinical Impression  Pt. was admitted w/ dark stools, GI bleed. Pt. lives alone but daughter is available. Pt. plans to stay w/ daughter at DC. Pt. has HHPT on board. Pt. will benefit from PT to improve in functional mobility and assist in determining  home O2 needs.    PT Assessment  Patient needs continued PT services    Follow Up Recommendations  Home health PT    Barriers to Discharge        Equipment Recommendations  None recommended by PT    Recommendations for Other Services     Frequency Min 3X/week    Precautions / Restrictions Precautions Precaution Comments: decreased sats   Pertinent Vitals/Pain See pulmonary      Mobility  Bed Mobility Bed Mobility: Sit to Supine Sit to Supine: 7: Independent Transfers Transfers: Sit to Stand;Stand to Sit Sit to Stand: 5: Supervision;From bed Stand to Sit: To bed;5: Supervision Ambulation/Gait Ambulation/Gait Assistance: 4: Min guard Ambulation Distance (Feet): 200 Feet Assistive device: 1 person hand held assist Ambulation/Gait Assistance Details: Pt declined use of RW Gait Pattern: Step-through pattern Gait velocity: decreased General Gait Details: Gait is steady w/ 1 light handhold    Exercises     PT Diagnosis: Difficulty walking  PT Problem List: Decreased strength;Cardiopulmonary status limiting activity;Decreased activity tolerance PT Treatment Interventions: DME instruction;Gait training;Functional mobility training;Therapeutic activities   PT Goals Acute Rehab PT Goals PT Goal Formulation: With patient/family Time For Goal Achievement: 01/10/12 Potential to Achieve Goals: Good Pt will Ambulate: >150 feet;with supervision PT Goal: Ambulate - Progress: Goal set today  Visit Information  Last PT  Received On: 11/29/11 Assistance Needed: +1    Subjective Data  Subjective: I lke to sit up on the side Patient Stated Goal: to goo home tomorrow.   Prior Functioning  Home Living Lives With: Alone Available Help at Discharge: Family;Available 24 hours/day Type of Home: House Home Access: Stairs to enter [employer] of Steps: 3 Home Layout: One level Bathroom Shower/Tub: Health visitor: Handicapped height Home Adaptive Equipment: Grab bars in shower;Walker - rolling;Straight cane;Shower chair with back    Cognition  Overall Cognitive Status: Appears within functional limits for tasks assessed/performed Arousal/Alertness: Awake/alert Orientation Level: Appears intact for tasks assessed Behavior During Session: [facility] for tasks performed    Extremity/Trunk Assessment Right Upper Extremity Assessment RUE ROM/Strength/Tone: WFL for tasks assessed RUE Sensation: WFL - Light Touch Left Upper Extremity Assessment LUE ROM/Strength/Tone: WFL for tasks assessed LUE Sensation: WFL - Light Touch Right Lower Extremity Assessment RLE ROM/Strength/Tone: WFL for tasks assessed RLE Sensation: WFL - Light Touch Left Lower Extremity Assessment LLE ROM/Strength/Tone: WFL for tasks assessed   Balance Dynamic Sitting Balance Dynamic Sitting - Balance Support: Feet supported;No upper extremity supported Dynamic Sitting - Level of Assistance: 7: Independent  End of Session PT - End of Session Activity Tolerance: Patient tolerated treatment well (dyspnea on RA) Patient left: in bed;with call bell/phone within reach;with family/visitor present Nurse Communication: Mobility status (drop in sats)  GP     Rada Hay 11/29/2011, 12:51 PM  [phone] QUALIFICATIONS:  Patient Saturations on  Room Air at Rest = NT  (96% on 1.5 l [place] ) at rest  Patient Saturations on Room Air while Ambulating = 77% HR 122  Patient Saturations on  Liters of  oxygen while  Ambulating = %NT placed back on 2 l at rest and sats to 96%  Statement of medical necessity for home oxygen:desaturation w/ activity on RA

## 2011-11-29 NOTE — Op Note (Signed)
[facility] [address] [place], [address]   ENDOSCOPY PROCEDURE REPORT  PATIENT: [name] [name], [name] [name]  MR#: [id number] BIRTHDATE: 08/18/1923 , 88  yrs. old GENDER: Female ENDOSCOPIST:Rosaleah Person, MD REFERRED BY:  Carilyn Goodpasture PA-C [facility] [place]) PROCEDURE DATE:  11/29/2011 PROCEDURE:      upper endoscopy ASA CLASS: INDICATIONS:   melenic stools, Hemoccult positive, chronic anemia MEDICATION:    fentanyl and Versed per CRNA TOPICAL ANESTHETIC:    Cetacaine spray  DESCRIPTION OF PROCEDURE:   the patient was brought from her hospital room to the endoscopy unit in a fasted state. Gradual sedation was administered by the CRNA in a cautious fashion, taking into account the patient's recent history of respiratory failure, her advanced age, and her underlying COPD.  At the conclusion of the procedure, there was transient desaturation down to approximately 70%, which responded to some brief bagging of the patient to supplement her respiration.  The Pentax pediatric video upper endoscope was passed easily under direct vision. There were some mucus secretions around the larynx which were suctioned out. The scope entered the esophagus very easily under direct vision.  The esophagus had extensive adherent white exudate consistent with Candida. I did not biopsy it because of the patient's anticoagulated status and also her tendency toward desaturation at the conclusion of the procedure.  The esophagus was otherwise normal, without evidence of stricture, significant hiatal hernia, Mallory-Weiss tear, reflux esophagitis, varices, or neoplasia.  The stomach was entered. It contained no blood or coffee-ground material, but a small bilious residual was present. The gastric mucosa was free of any erosions, ulcers, polyps, or masses, and the pylorus was widely patent.  The duodenal bulb and second duodenum looked normal.  A retroflex view of the cardia was  unremarkable area  The scope was removed from the patient who tolerated the procedure quite well, apart from the above-mentioned transient desaturation which responded promptly to putting a mask on the patient, stimulating the patient, and doing some minimal bagging.  No biopsies were obtained           COMPLICATIONS: None  ENDOSCOPIC IMPRESSION:  1. Candidal esophagitis 2. No cause for heme positive for dark stools evident on this exam  RECOMMENDATIONS:  1. Initiate treatment for Candida with nystatin 2. No endoscopic contraindication to resumption of anticoagulation identified. 3. Given the absence of a specific explanation for the patient's recent (but apparently minor) GI blood loss, she will need to be carefully monitored if and when anticoagulation is resumed    _______________________________ eSigned:  Bernette Redbird, MD 11/29/2011 2:13 PM    PATIENT NAME:  [name], [name] MR#: [id number]

## 2011-11-29 NOTE — Progress Notes (Signed)
Patient had 4 beats of vtach. Patient was resting at the time. VS stable. MD notified. No new orders. Will continue to monitor. Setzer, Don Broach

## 2011-11-29 NOTE — Anesthesia Postprocedure Evaluation (Signed)
Anesthesia Post Note  Patient: [name] [name]  Procedure(s) Performed: Procedure(s) (LRB): ESOPHAGOGASTRODUODENOSCOPY (EGD) (N/A)  Anesthesia type: MAC  Patient location: PACU  Post pain: Pain level controlled  Post assessment: Post-op Vital signs reviewed  Last Vitals:  Filed Vitals:   11/29/11 1409  BP: 129/87  Pulse:   Temp:   Resp: 24    Post vital signs: Reviewed  Level of consciousness: sedated  Complications: No apparent anesthesia complications

## 2011-11-29 NOTE — Progress Notes (Addendum)
[facility] PROGRESS NOTE  Jahni Paul Glick XBM:[id number] DOB: 1923-05-20 DOA: 11/28/2011 PCP: Carilyn Goodpasture, PA  Brief narrative:  [name] [name] is 78 year old pleasant female with an extensive medical history including but not limited to COPD, CHF, C. Diff colitis, recently diagnosed atrial fibrillation (on xarelto) who was at her PCP Monday for regular follow up visit and was complaining of dark stool for about 1 week prior to this admission. Patient was directly admitted to telemetry floor. Upon initial evaluation patient was found to have a hemoglobin of 9.2 but no active bleed. Gastroenterology Deboraha Sprang) consulted. Awaiting official consult report.  Assessment and Plan:   Principal problem:  Upper GI bleed  - likely due to xarelto  - we will continue to hold xarelto  - follow up on GI recommendations, possible EGD on this admission - Repeat hemoglobin 8.8  Active problems Chronic diastolic CHF - Continue Cardizem 240 mg daily and Lasix 40 mg daily  COPD - Stable - Nebulizers when necessary only  Atrial fibrillation - Rate controlled with Cardizem 240 mg daily and amiodarone 200 mg twice a day - Xarelto on hold  Chronic kidney disease, stage III - Creatinine 1.26 on admission, around baseline value  Hypertension - At goal - Continue Cardizem 240 mg daily  Dyslipidemia - Continue atorvastatin 40 mg daily at bedtime  Diet - As tolerated  DVT prophylaxis - SCDs bilaterally   Code: full code Family: updated daughter at bedside Disposition: home when stable  Manson Passey [facility]  [phone]  If 7PM-7AM, please contact night-coverage [email or web address] Password [email or web address] 11/29/2011, 11:17 AM   LOS: 1 day   Consultants:  Gastroenterology, Deboraha Sprang GI  Procedures:  None but perhaps EGD once GI evaluation completed  Antibiotics:  None  HPI/Subjective: No acute events overnight. Patient did have a bowel movement but does not remember if it was still  dark.  Objective: Filed Vitals:   11/28/11 2045 11/29/11 0606 11/29/11 0614 11/29/11 0906  BP:  114/75    Pulse:  106    Temp:  98 F (36.7 C)    TempSrc:  Oral    Resp:  20    Height:      Weight:  78.2 kg (172 lb 6.4 oz)    SpO2: 90% 89% 95% 96%    Intake/Output Summary (Last 24 hours) at 11/29/11 1117 Last data filed at 11/28/11 2300  Gross per 24 hour  Intake 604.16 ml  Output    300 ml  Net 304.16 ml    Exam:   General:  Pt is alert, follows commands appropriately, not in acute distress  Cardiovascular: Irregular rhythm, rate controlled, S1/S2, no murmurs, no rubs, no gallops  Respiratory: Clear to auscultation bilaterally, no wheezing, no crackles, no rhonchi  Abdomen: Soft, non tender, non distended, bowel sounds present, no guarding  Extremities: +2 lower extremity pitting edema, pulses DP and PT palpable bilaterally  Neuro: Grossly nonfocal  Data Reviewed: Basic Metabolic Panel:  Lab 11/29/11 6440 11/28/11 1354  NA 141 141  K 4.0 3.8  CL 104 103  CO2 30 30  GLUCOSE 105* 81  BUN 23 21  CREATININE 1.33* 1.26*  CALCIUM 8.6 9.1   Liver Function Tests:  Lab 11/29/11 0440 11/28/11 1354  AST 11 13  ALT 10 11  ALKPHOS 60 66  BILITOT 0.4 0.5  PROT 5.8* 6.4  ALBUMIN 2.5* 2.9*   CBC:  Lab 11/29/11 0440 11/28/11 1354  WBC 7.1 6.6  HGB 8.8* 9.2*  HCT 28.4* 29.2*  MCV 93.7 93.0  PLT 193 210   CBG:  Lab 11/29/11 0724  GLUCAP 93    Studies: No results found.  Scheduled Meds:   . allopurinol  100 mg Oral Daily  . amiodarone  200 mg Oral BID  . atorvastatin  40 mg Oral Daily  . diltiazem  240 mg Oral Daily  . Fluticasone-Salmeterol  1 puff Inhalation BID  . furosemide  40 mg Oral Daily  . omega-3 acid ethyl esters  1 g Oral BID  . raloxifene  60 mg Oral Daily  . saccharomyces boulardii  250 mg Oral BID

## 2011-11-29 NOTE — Consult Note (Signed)
Referring Provider: Dr. Elisabeth Pigeon Primary Care Physician:  Carilyn Goodpasture, PA Primary Gastroenterologist:  None  Reason for Consultation:  Heme-positive dark stools  HPI: [name] [name] is a 78 y.o. female admitted to the hospital yesterday following a roughly 1 week history of dark stools, in association with weakness. The stools were fairly frequent, and loose in character, suggestive of melena, and without obvious alternative explanation such as recent Pepto-Bismol use or iron supplementation. She was Hemoccult positive yesterday in her primary physician's office, by her report.   She was recently begun on Xarelto for atrial fibrillation.   The patient's hemoglobin is essentially unchanged from what it was a week ago and indeed, she has run a hemoglobin of approximately 9 for the past several months.   She does not have any active GI symptoms apart from some residual loose stools following a recent recurrence of C. difficile when she was in the hospital a couple of weeks ago. She has had acute respiratory failure requiring intubation in association with pneumonia earlier this year. However, she does not require home oxygen. She is not on any ulcerogenic medications, and has no prior history of GI bleeding.     Past Medical History  Diagnosis Date  . Hypertension   . Hyperlipidemia   . COPD (chronic obstructive pulmonary disease)   . Osteoporosis   . Arthritis   . H/O hiatal hernia   . Chronic back pain   . CAD (coronary artery disease)     Cath 2005 with moderate CAD, Dr. [place]  . Atrial fibrillation   . C. difficile diarrhea   . Gout     Past Surgical History  Procedure Date  . Carotid endarterectomy     bilateral  . Cataract extraction, bilateral   . Joint replacement 2008    Right Total Knee  . Anal fistula repair   . Left parotidectomy   . Eye surgery   . Flexible sigmoidoscopy 09/02/2011    Procedure: FLEXIBLE SIGMOIDOSCOPY;  Surgeon: Theda Belfast, MD;  Location:  [facility] ENDOSCOPY;  Service: Endoscopy;  Laterality: N/A;  . Flexible sigmoidoscopy 09/03/2011    Procedure: FLEXIBLE SIGMOIDOSCOPY;  Surgeon: Theda Belfast, MD;  Location: [facility] ENDOSCOPY;  Service: Endoscopy;  Laterality: N/A;  . Tee without cardioversion 10/04/2011    Procedure: TRANSESOPHAGEAL ECHOCARDIOGRAM (TEE);  Surgeon: Laurey Morale, MD;  Location: [facility] ENDOSCOPY;  Service: Cardiovascular;  Laterality: N/A;  to be carelinked here by 1230-verified 7/29/dl  . Cardioversion 10/04/2011    Procedure: CARDIOVERSION;  Surgeon: Laurey Morale, MD;  Location: [facility] ENDOSCOPY;  Service: Cardiovascular;  Laterality: N/A;    Prior to Admission medications   Medication Sig Start Date End Date Taking? Authorizing Provider  allopurinol (ZYLOPRIM) 100 MG tablet Take 100 mg by mouth daily.    Yes Historical Provider, MD  amiodarone (PACERONE) 200 MG tablet Take 200 mg by mouth 2 (two) times daily.   Yes Historical Provider, MD  atorvastatin (LIPITOR) 40 MG tablet Take 40 mg by mouth daily.   Yes Historical Provider, MD  diltiazem (CARDIZEM CD) 240 MG 24 hr capsule Take 1 capsule (240 mg total) by mouth daily. 09/15/11 09/14/12 Yes Adeline Joselyn Glassman, MD  fish oil-omega-3 fatty acids 1000 MG capsule Take 1 g by mouth 2 (two) times daily.    Yes Historical Provider, MD  Fluticasone-Salmeterol (ADVAIR) 250-50 MCG/DOSE AEPB Inhale 1 puff into the lungs 2 (two) times daily. 09/15/11  Yes Adeline Joselyn Glassman, MD  furosemide (LASIX) 40 MG tablet  Take 40 mg by mouth daily.   Yes Historical Provider, MD  levalbuterol [facility] HFA) 45 MCG/ACT inhaler Inhale 1-2 puffs into the lungs every 4 (four) hours as needed for wheezing. 09/15/11 09/14/12 Yes Adeline Joselyn Glassman, MD  raloxifene (EVISTA) 60 MG tablet Take 60 mg by mouth daily.   Yes Historical Provider, MD  Rivaroxaban (XARELTO) 15 MG TABS tablet Take 1 tablet (15 mg total) by mouth daily. 11/21/11  Yes Nishant Dhungel, MD  saccharomyces boulardii (FLORASTOR) 250 MG capsule Take 250  mg by mouth 2 (two) times daily.   Yes Historical Provider, MD  levofloxacin (LEVAQUIN) 750 MG tablet Take 1 tablet (750 mg total) by mouth daily. 11/21/11   Nishant Dhungel, MD    Current Facility-Administered Medications  Medication Dose Route Frequency Provider Last Rate Last Dose  . 0.9 %  sodium chloride infusion   Intravenous Continuous Alison Murray, MD 50 mL/hr at 11/28/11 1319 50 mL/hr at 11/28/11 1319  . acetaminophen (TYLENOL) tablet 650 mg  650 mg Oral Q6H PRN Alison Murray, MD       Or  . acetaminophen (TYLENOL) suppository 650 mg  650 mg Rectal Q6H PRN Alison Murray, MD      . albuterol (PROVENTIL) (5 MG/ML) 0.5% nebulizer solution 2.5 mg  2.5 mg Nebulization Q4H PRN Alison Murray, MD      . allopurinol (ZYLOPRIM) tablet 100 mg  100 mg Oral Daily Alison Murray, MD      . amiodarone (PACERONE) tablet 200 mg  200 mg Oral BID Alison Murray, MD   200 mg at 11/28/11 2210  . atorvastatin (LIPITOR) tablet 40 mg  40 mg Oral Daily Alison Murray, MD   40 mg at 11/28/11 1743  . diltiazem (CARDIZEM CD) 24 hr capsule 240 mg  240 mg Oral Daily Alison Murray, MD      . Fluticasone-Salmeterol (ADVAIR) 250-50 MCG/DOSE inhaler 1 puff  1 puff Inhalation BID Alison Murray, MD   1 puff at 11/29/11 0904  . furosemide (LASIX) tablet 40 mg  40 mg Oral Daily Alison Murray, MD      . HYDROcodone-acetaminophen (NORCO/VICODIN) 5-325 MG per tablet 1-2 tablet  1-2 tablet Oral Q4H PRN Alison Murray, MD      . ipratropium (ATROVENT) nebulizer solution 0.5 mg  0.5 mg Nebulization Q4H PRN Alison Murray, MD      . levalbuterol [facility] HFA) inhaler 1-2 puff  1-2 puff Inhalation Q4H PRN Alison Murray, MD      . omega-3 acid ethyl esters (LOVAZA) capsule 1 g  1 g Oral BID Alison Murray, MD   1 g at 11/28/11 2210  . ondansetron (ZOFRAN) tablet 4 mg  4 mg Oral Q6H PRN Alison Murray, MD       Or  . ondansetron [facility]) injection 4 mg  4 mg Intravenous Q6H PRN Alison Murray, MD      . raloxifene (EVISTA) tablet 60 mg   60 mg Oral Daily Alison Murray, MD      . saccharomyces boulardii [facility]) capsule 250 mg  250 mg Oral BID Alison Murray, MD   250 mg at 11/28/11 2210  . sodium chloride 0.9 % injection 3 mL  3 mL Intravenous Q12H Alison Murray, MD      . DISCONTD: fish oil-omega-3 fatty acids capsule 1 g  1 g Oral BID Alison Murray, MD  Allergies as of 11/28/2011 - Review Complete 11/28/2011  Allergen Reaction Noted  . Baby powder (methylbenzethonium) Shortness Of Breath 11/02/2011  . Demerol (meperidine) Hives 08/30/2011  . Penicillins Hives 08/30/2011    Family History  Problem Relation Age of Onset  . Hemochromatosis Sister   . Diabetes type II Sister     History   Social History  . Marital Status: Widowed    Spouse Name: N/A    Number of Children: N/A  . Years of Education: N/A   Occupational History  . Not on file.   Social History Main Topics  . Smoking status: Former Smoker -- 1.0 packs/day for 20 years    Types: Cigarettes    Quit date: 09/01/1983  . Smokeless tobacco: Never Used  . Alcohol Use: No  . Drug Use: No  . Sexually Active: No   Other Topics Concern  . Not on file   Social History Narrative  . No narrative on file    Review of Systems: See history of present illness. Pertinent for tendency toward loose stools and weakness on mild exertion, which is probably multifactorial  Physical Exam: Vital signs in last 24 hours: Temp:  [98 F (36.7 C)-98.7 F (37.1 C)] 98 F (36.7 C) (09/24 0606) Pulse Rate:  [67-109] 106  (09/24 0606) Resp:  [18-24] 20  (09/24 0606) BP: (99-121)/(56-75) 114/75 mmHg (09/24 0606) SpO2:  [89 %-96 %] 96 % (09/24 0906) Weight:  [78.2 kg (172 lb 6.4 oz)-78.881 kg (173 lb 14.4 oz)] 78.2 kg (172 lb 6.4 oz) (09/24 0606) Last BM Date: 11/29/11 General:   Alert,  Well-developed, well-nourished, pleasant and cooperative in NAD Head:  Normocephalic and atraumatic. Eyes:  Sclera clear, no icterus.   Lungs:  No evident respiratory  distress. Bilateral basilar crackles present, without rhonchi or wheezes Heart:   Rhythm is irregular, suggestive of recurrent A. fib. Heart rate around 100. Abdomen:  Soft, nontender, nontympanitic, somewhat obese.   Rectal:  This was performed yesterday in her primary physician's office, and showed heme-positive stool   Msk:   Symmetrical without gross deformities. Neurologic:  Alert and coherent;  grossly normal neurologically. Skin:  Intact without significant lesions or rashes. Psych:   Alert and cooperative. Normal mood and affect.  Intake/Output from previous day: 09/23 0701 - 09/24 0700 In: 604.2 [P.O.:120; I.V.:484.2] Out: 300 [Urine:300] Intake/Output this shift:    Lab Results:  Basename 11/29/11 0440 11/28/11 1354  WBC 7.1 6.6  HGB 8.8* 9.2*  HCT 28.4* 29.2*  PLT 193 210   BMET  Basename 11/29/11 0440 11/28/11 1354  NA 141 141  K 4.0 3.8  CL 104 103  CO2 30 30  GLUCOSE 105* 81  BUN 23 21  CREATININE 1.33* 1.26*  CALCIUM 8.6 9.1   LFT  Basename 11/29/11 0440  PROT 5.8*  ALBUMIN 2.5*  AST 11  ALT 10  ALKPHOS 60  BILITOT 0.4  BILIDIR --  IBILI --   PT/INR  Basename 11/28/11 1354  LABPROT 28.6*  INR 2.87*    C-Diff Positive as of 3 weeks ago prior to most recent hospitalization  Studies/Results: No results found.  Impression: This appears to than a low-grade, self-limited upper GI bleed. The cause is unclear, but it appears to have been exacerbated by her anticoagulation. The currently normal BUN would argue against active bleeding at this time, so my assumption is that the bleeding occurred several days ago, when she began to feel weak.  Plan: Endoscopic evaluation under MAC  later today. Ashby Dawes, purpose, and risks reviewed. In particular, with her history of respiratory failure and advanced age, we will need to eat cautious with our sedation. I feel that endoscopic evaluation will help to guide future decisions regarding anticoagulation, in  this elderly patient who appears to have had a minor GI bleed.   LOS: 1 day   Sadao Weyer V  11/29/2011, 12:02 PM

## 2011-11-29 NOTE — Progress Notes (Signed)
Patient's endoscopy was negative for source of bleeding. She did have probable extensive esophageal candidiasis, for which I have ordered nystatin suspension. The patient did have transient desaturation during her procedure, not too surprising given her underlying pulmonary status, but overall she tolerated the procedure quite well.  Recommendations:  1. Nystatin suspension for about one week to treat probable esophageal candidiasis 2. Consider cardiology consultation, since it appears that the patient is now back in atrial fibrillation, which may be contributing to her pulmonary compromise 3. Based on today's negative endoscopy, I think that resumption of anticoagulation is reasonable, but the patient will need to be carefully monitored for evidence of further bleeding 4. The patient indicates she had a negative colonoscopy some years ago, perhaps 8 or 10 years ago. Realistically, given the patient's advanced age and medical problems, I do not think that updated colonoscopy is advisable, nor do I think it would likely identify any treatable source of GI blood loss. 5. The patient has an elevated INR. I cannot explain this on the basis of her Xarelto therapy prior to admission. You might want to consider a pharmacy consultation to help discern whether treatment of this finding with vitamin K is appropriate, or whether it would interfere with subsequent anticoagulation with Xarelto, if it is chosen to resume that therapy. 5. I appreciate the opportunity to be involved in the care of this very pleasant patient. I will sign off at this time, but please call if you feel that I could be of further assistance in her care.  Florencia Reasons, M.D. [phone]

## 2011-11-29 NOTE — Anesthesia Preprocedure Evaluation (Addendum)
Anesthesia Evaluation  Patient identified by MRN, date of birth, ID band Patient awake    Reviewed: Allergy & Precautions, H&P , NPO status , Patient's Chart, lab work & pertinent test results  History of Anesthesia Complications Negative for: history of anesthetic complications  Airway Mallampati: I TM Distance: >3 FB Neck ROM: full    Dental  (+) Teeth Intact, Poor Dentition, Chipped and Dental Advisory Given   Pulmonary shortness of breath and at rest, COPD         Cardiovascular hypertension, Pt. on medications + CAD and +CHF + dysrhythmias Atrial Fibrillation Rhythm:irregular Rate:Normal     Neuro/Psych Bilateral CEA; denies CVA    GI/Hepatic Neg liver ROS, hiatal hernia,   Endo/Other  negative endocrine ROS  Renal/GU negative Renal ROS     Musculoskeletal negative musculoskeletal ROS (+)   Abdominal   Peds  Hematology negative hematology ROS (+)   Anesthesia Other Findings   Reproductive/Obstetrics negative OB ROS                          Anesthesia Physical Anesthesia Plan  ASA: III  Anesthesia Plan: MAC   Post-op Pain Management:    Induction:   Airway Management Planned: Nasal Cannula  Additional Equipment:   Intra-op Plan:   Post-operative Plan:   Informed Consent: I have reviewed the patients History and Physical, chart, labs and discussed the procedure including the risks, benefits and alternatives for the proposed anesthesia with the patient or authorized representative who has indicated his/her understanding and acceptance.   Dental advisory given  Plan Discussed with: CRNA  Anesthesia Plan Comments:         Anesthesia Quick Evaluation

## 2011-11-29 NOTE — Care Management Note (Unsigned)
    Page 1 of 2   12/03/2011     8:39:06 AM   CARE MANAGEMENT NOTE 12/03/2011  Patient:  [name] [name],[name] [name]   Account Number:  [id number]  Date Initiated:  11/29/2011  Documentation initiated by:  Lanier Clam  Subjective/Objective Assessment:   ADMITTED W/UGIB.READMIT 9/13-9/16.     Action/Plan:   FROM HOME W/DAUGHTER.ACTIVE W/[facility]-HHPT/OT   Anticipated DC Date:  12/05/2011   Anticipated DC Plan:  HOME W HOME HEALTH SERVICES      DC Planning Services  CM consult      Choice offered to / List presented to:  C-1 Patient        HH arranged  HH-1 RN  HH-2 PT  HH-3 OT      [facility] agency  [facility]   Status of service:  In process, will continue to follow Medicare Important Message given?   (If response is "NO", the following Medicare IM given date fields will be blank) Date Medicare IM given:   Date Additional Medicare IM given:    Discharge Disposition:    Per UR Regulation:  Reviewed for med. necessity/level of care/duration of stay  If discussed at Long Length of Stay Meetings, dates discussed:    Comments:  12/03/2011  8:30am  Konrad Felix RN, case manager   [phone] New order for home O2 - left directions to obtain O2 assessment prior to ordering home O2.  12/02/11 KATHY MAHABIR RN,BSN NCM [phone] [facility] ALREADY FOLLOWING FOR HH.NOTED DESATS ON RA.IF HOME 02 NEEDED CAN ARRANGE.  11/29/11 KATHY MAHABIR RN,BSN NCM [phone]

## 2011-11-29 NOTE — Progress Notes (Signed)
Reported to Nurse

## 2011-11-29 NOTE — Progress Notes (Signed)
Pt had 7 beat run of vtach, VS stable, pt asymptomatic lying asleep in bed. Notified NP on call, will continue to monitor pt.

## 2011-11-29 NOTE — Preoperative (Signed)
Beta Blockers   Reason not to administer Beta Blockers:Not Applicable 

## 2011-11-30 ENCOUNTER — Encounter (HOSPITAL_COMMUNITY): Payer: Self-pay | Admitting: Gastroenterology

## 2011-11-30 ENCOUNTER — Ambulatory Visit: Payer: Medicare Other | Admitting: Cardiovascular Disease

## 2011-11-30 DIAGNOSIS — K922 Gastrointestinal hemorrhage, unspecified: Secondary | ICD-10-CM

## 2011-11-30 DIAGNOSIS — B3781 Candidal esophagitis: Secondary | ICD-10-CM | POA: Diagnosis present

## 2011-11-30 DIAGNOSIS — A0472 Enterocolitis due to Clostridium difficile, not specified as recurrent: Secondary | ICD-10-CM | POA: Diagnosis present

## 2011-11-30 LAB — CBC
Platelets: 193 10*3/uL (ref 150–400)
RBC: 3.02 MIL/uL — ABNORMAL LOW (ref 3.87–5.11)
RDW: 15.7 % — ABNORMAL HIGH (ref 11.5–15.5)
WBC: 11.6 10*3/uL — ABNORMAL HIGH (ref 4.0–10.5)

## 2011-11-30 LAB — GLUCOSE, CAPILLARY: Glucose-Capillary: 124 mg/dL — ABNORMAL HIGH (ref 70–99)

## 2011-11-30 LAB — BASIC METABOLIC PANEL
Chloride: 102 mEq/L (ref 96–112)
Creatinine, Ser: 1.21 mg/dL — ABNORMAL HIGH (ref 0.50–1.10)
GFR calc Af Amer: 46 mL/min — ABNORMAL LOW (ref >90)
Potassium: 4 mEq/L (ref 3.5–5.1)
Sodium: 139 mEq/L (ref 135–145)

## 2011-11-30 LAB — CLOSTRIDIUM DIFFICILE BY PCR: Toxigenic C. Difficile by PCR: POSITIVE — AB

## 2011-11-30 MED ORDER — VANCOMYCIN 50 MG/ML ORAL SOLUTION: 125.0000 mg | ORAL | Status: DC

## 2011-11-30 MED ORDER — RIVAROXABAN 15 MG PO TABS
15.0000 mg | ORAL_TABLET | Freq: Every day | ORAL | Status: DC
  Administered 2011-11-30 – 2011-12-02 (×3): 15 mg via ORAL
  Filled 2011-11-30 (×4): qty 1

## 2011-11-30 MED ORDER — SODIUM CHLORIDE 0.9 % IV BOLUS (SEPSIS)
500.0000 mL | Freq: Once | INTRAVENOUS | Status: AC
  Administered 2011-11-30: 500 mL via INTRAVENOUS

## 2011-11-30 MED ORDER — VANCOMYCIN 50 MG/ML ORAL SOLUTION: 125.0000 mg | Freq: Two times a day (BID) | ORAL | Status: DC

## 2011-11-30 MED ORDER — VANCOMYCIN 50 MG/ML ORAL SOLUTION: 125.0000 mg | Freq: Every day | ORAL | Status: DC

## 2011-11-30 MED ORDER — VANCOMYCIN 50 MG/ML ORAL SOLUTION
125.0000 mg | Freq: Four times a day (QID) | ORAL | Status: DC
  Administered 2011-11-30 – 2011-12-03 (×12): 125 mg via ORAL
  Filled 2011-11-30 (×19): qty 2.5

## 2011-11-30 MED ORDER — VITAMINS A & D EX OINT
TOPICAL_OINTMENT | CUTANEOUS | Status: AC
  Administered 2011-11-30: 5
  Filled 2011-11-30: qty 5

## 2011-11-30 NOTE — Progress Notes (Signed)
ANTIBIOTIC CONSULT NOTE - INITIAL  Pharmacy Consult for C.diff Management Indication: C.diff PCR positive  Allergies  Allergen Reactions  . Baby Powder (Methylbenzethonium) Shortness Of Breath  . Demerol (Meperidine) Hives  . Penicillins Hives    Patient Measurements: Height: 5\' 1"  (154.9 cm) Weight: 170 lb 6.4 oz (77.293 kg) IBW/kg (Calculated) : 47.8   Vital Signs: Temp: 98.2 F (36.8 C) (09/25 0556) Temp src: Oral (09/25 0556) BP: 102/57 mmHg (09/25 0853) Pulse Rate: 114  (09/25 0556) Intake/Output from previous day: 09/24 0701 - 09/25 0700 In: 200 [I.V.:200] Out: 352 [Urine:350; Stool:2] Intake/Output from this shift: Total I/O In: 240 [P.O.:240] Out: -   Labs:  Basename 11/30/11 0855 11/29/11 0440 11/28/11 1354  WBC 11.6* 7.1 6.6  HGB 8.8* 8.8* 9.2*  PLT 193 193 210  LABCREA -- -- --  CREATININE 1.21* 1.33* 1.26*   Estimated Creatinine Clearance: 31.4 ml/min (by C-G formula based on Cr of 1.21).   Microbiology: Recent Results (from the past 720 hour(s))  STOOL CULTURE     Status: Normal   Collection Time   11/03/11  1:34 AM      Component Value Range Status Comment   Specimen Description STOOL   Final    Special Requests NONE   Final    Culture     Final    Value: NO SALMONELLA, SHIGELLA, CAMPYLOBACTER, YERSINIA, OR E.COLI 0157:H7 ISOLATED   Report Status 11/07/2011 FINAL   Final   CLOSTRIDIUM DIFFICILE BY PCR     Status: Abnormal   Collection Time   11/03/11  1:35 AM      Component Value Range Status Comment   C difficile by pcr POSITIVE (*) NEGATIVE Final   MRSA PCR SCREENING     Status: Normal   Collection Time   11/19/11  2:32 PM      Component Value Range Status Comment   MRSA by PCR NEGATIVE  NEGATIVE Final   CULTURE, EXPECTORATED SPUTUM-ASSESSMENT     Status: Normal   Collection Time   11/19/11  8:37 PM      Component Value Range Status Comment   Specimen Description SPUTUM   Final    Special Requests Normal   Final    Sputum evaluation      Final    Value: THIS SPECIMEN IS ACCEPTABLE. RESPIRATORY CULTURE REPORT TO FOLLOW.   Report Status 11/19/2011 FINAL   Final   CULTURE, RESPIRATORY     Status: Normal   Collection Time   11/19/11 10:28 PM      Component Value Range Status Comment   Specimen Description SPUTUM   Final    Special Requests NONE   Final    Gram Stain     Final    Value: NO WBC SEEN     NO SQUAMOUS EPITHELIAL CELLS SEEN     NO ORGANISMS SEEN   Culture FEW CANDIDA ALBICANS   Final    Report Status 11/22/2011 FINAL   Final   CLOSTRIDIUM DIFFICILE BY PCR     Status: Abnormal   Collection Time   11/29/11  8:38 PM      Component Value Range Status Comment   C difficile by pcr POSITIVE (*) NEGATIVE Final     Medical History: Past Medical History  Diagnosis Date  . Hypertension   . Hyperlipidemia   . COPD (chronic obstructive pulmonary disease)   . Osteoporosis   . Arthritis   . H/O hiatal hernia   . Chronic back pain   .  CAD (coronary artery disease)     Cath 2005 with moderate CAD, Dr. [place]  . C. difficile diarrhea   . Gout   . Atrial fibrillation     Assessment: 41 YOF recently completed a two week course of PO vancomycin for c.diff last week.  Patient has been readmitted with potential GI bleed.  Now having diarrhea again, Cdiff PCR performed yesterday was positive.  Patient was positive in July and late August as well so will treat as her 2nd recurrent episode.  SCr appears near baseline (CKD, stage III).  WBC increased to 11.6 today, pt is afebrile.  Continue Florastor as ordered.  Goal of Therapy:  eradication of infection  Plan:  Begin vancomycin taper for 2nd recurrent episode of C.diff: -Vancomycin 125 mg PO QID x 7 days, then  -Vancomycin 125 mg PO BID x 7 days, then  -Vancomycin 125 mg PO daily x 7 days, then  -Vancomycin 125 mg PO q48h x 7 days, then  -Vancomycin 125 mg PO q72 hours x 14 days.  Clance Boll 11/30/2011,12:20 PM

## 2011-11-30 NOTE — Progress Notes (Signed)
[facility] PROGRESS NOTE  [name] [name] RUE:[id number] DOB: 1923-07-31 DOA: 11/28/2011 PCP: [name] Goodpasture, PA  Assessment/Plan: Principal Problem:  *Upper GI bleed Active Problems:  Hyperlipidemia  Atrial fibrillation  Diastolic CHF, chronic  Acute blood loss anemia  Colitis, Clostridium difficile  Candida esophagitis  #1 upper GI bleed Questionable etiology. Patient is status post upper endoscopy with negative source of bleeding however did show extensive esophageal candidiasis. H&H has remained stable. Per GI okay to resume anticoagulation. Follow.  #2 Candida esophagitis Continue nystatin as per GI.  #3 acute blood loss anemia Secondary to problem #1. H&H is stable.  #4 recurrent C. difficile colitis Per daughter this is patient's third episode of a C. difficile colitis. Patient is still having the loose stools. Patient is currently afebrile. Continue oral vancomycin. Continue fluoroscopy store. Monitor.  #5 A. fib Patient does have a heart rate in the low 100s. May be secondary to #4. Patient does have a borderline blood pressure. We'll give a bolus of normal saline 500 cc x1. Continue Cardizem and amiodarone for rate control. We'll resume xarelto. Monitor H&H.  #6 chronic diastolic CHF Stable. Continue Lasix, lovaza.  Code Status: Full Family Communication: Updated patient and daughter at bedside Disposition Plan: Home with daughter when medically stable.   Brief narrative: [name] [name] is 78 year old pleasant femalemale with an extensive medical history including but not limited to COPD, CHF, C. Diff colitis, recently diagnosed atrial fibrillation (on xarelto) who was at her PCP Monday for regular follow up visit and was complaining of dark stool for about 1 week prior to this admission. Patient was directly admitted to telemetry floor. Upon initial evaluation patient was found to have a hemoglobin of 9.2 but no active bleed. Gastroenterology [name] [name]) consulted.     Consultants:  Gastroenterology Dr. Matthias Hughs 11/29/2011  Procedures:  Upper endoscopy 11/29/2011  Antibiotics:  Oral vancomycin 11/30/2011  HPI/Subjective: Patient states had 3 loose bowel movements today.  Objective: Filed Vitals:   11/29/11 2133 11/30/11 0556 11/30/11 0853 11/30/11 1424  BP: 92/47 122/61 102/57 94/47  Pulse: 94 114  103  Temp: 98.4 F (36.9 C) 98.2 F (36.8 C)  99 F (37.2 C)  TempSrc: Oral Oral  Oral  Resp: 22 22  20   Height:      Weight:  77.293 kg (170 lb 6.4 oz)    SpO2: 95% 94%  93%    Intake/Output Summary (Last 24 hours) at 11/30/11 1622 Last data filed at 11/30/11 1427  Gross per 24 hour  Intake    360 ml  Output    202 ml  Net    158 ml   Filed Weights   11/28/11 1215 11/29/11 0606 11/30/11 0556  Weight: 78.881 kg (173 lb 14.4 oz) 78.2 kg (172 lb 6.4 oz) 77.293 kg (170 lb 6.4 oz)    Exam:   General:  NAD  Cardiovascular: Irregularly irregular, no JVD. No lower extremity edema.  Respiratory: ctab  Abdomen: soft/nt/nd/+bs  Data Reviewed: Basic Metabolic Panel:  Lab 11/30/11 1478 11/29/11 0440 11/28/11 1354  NA 139 141 141  K 4.0 4.0 3.8  CL 102 104 103  CO2 30 30 30   GLUCOSE 104* 105* 81  BUN 16 23 21   CREATININE 1.21* 1.33* 1.26*  CALCIUM 8.6 8.6 9.1  MG -- -- 2.2  PHOS -- -- 3.6   Liver Function Tests:  Lab 11/29/11 0440 11/28/11 1354  AST 11 13  ALT 10 11  ALKPHOS 60 66  BILITOT 0.4 0.5  PROT 5.8* 6.4  ALBUMIN 2.5* 2.9*   No results found for this basename: LIPASE:5,AMYLASE:5 in the last 168 hours No results found for this basename: AMMONIA:5 in the last 168 hours CBC:  Lab 11/30/11 0855 11/29/11 0440 11/28/11 1354  WBC 11.6* 7.1 6.6  NEUTROABS -- -- 5.2  HGB 8.8* 8.8* 9.2*  HCT 28.2* 28.4* 29.2*  MCV 93.4 93.7 93.0  PLT 193 193 210   Cardiac Enzymes: No results found for this basename: CKTOTAL:5,CKMB:5,CKMBINDEX:5,TROPONINI:5 in the last 168 hours BNP (last 3 results)  Basename 11/28/11  1353 10/01/11 0718 09/29/11 0008  PROBNP 5095.0* 1563.0* 2283.0*   CBG:  Lab 11/30/11 0746 11/29/11 0724  GLUCAP 124* 93    Recent Results (from the past 240 hour(s))  CLOSTRIDIUM DIFFICILE BY PCR     Status: Abnormal   Collection Time   11/29/11  8:38 PM      Component Value Range Status Comment   C difficile by pcr POSITIVE (*) NEGATIVE Final      Studies: No results found.  Scheduled Meds:    . allopurinol  100 mg Oral Daily  . amiodarone  200 mg Oral BID  . atorvastatin  40 mg Oral Daily  . diltiazem  240 mg Oral Daily  . Fluticasone-Salmeterol  1 puff Inhalation BID  . furosemide  40 mg Oral Daily  . nystatin  5 mL Oral QID  . omega-3 acid ethyl esters  1 g Oral BID  . raloxifene  60 mg Oral Daily  . Rivaroxaban  15 mg Oral QAC supper  . saccharomyces boulardii  250 mg Oral BID  . sodium chloride  500 mL Intravenous Once  . sodium chloride  3 mL Intravenous Q12H  . vancomycin  125 mg Oral QID   Followed by  . vancomycin  125 mg Oral BID   Followed by  . vancomycin  125 mg Oral Daily   Followed by  . vancomycin  125 mg Oral QODAY   Followed by  . vancomycin  125 mg Oral Q3 days   Continuous Infusions:   Principal Problem:  *Upper GI bleed Active Problems:  Hyperlipidemia  Atrial fibrillation  Diastolic CHF, chronic  Acute blood loss anemia  Colitis, Clostridium difficile  Candida esophagitis    Time spent: > 35 mins    [facility]  [facility] Pager [phone]. If 8PM-8AM, please contact night-coverage at [email or web address], password [facility] 11/30/2011, 4:22 PM  LOS: 2 days

## 2011-12-01 ENCOUNTER — Inpatient Hospital Stay (HOSPITAL_COMMUNITY): Payer: Medicare Other

## 2011-12-01 LAB — BASIC METABOLIC PANEL
BUN: 14 mg/dL (ref 6–23)
CO2: 26 mEq/L (ref 19–32)
Chloride: 101 mEq/L (ref 96–112)
Creatinine, Ser: 1.16 mg/dL — ABNORMAL HIGH (ref 0.50–1.10)
GFR calc Af Amer: 48 mL/min — ABNORMAL LOW (ref >90)
Potassium: 4 mEq/L (ref 3.5–5.1)

## 2011-12-01 LAB — CBC WITH DIFFERENTIAL/PLATELET
Basophils Absolute: 0.1 10*3/uL (ref 0.0–0.1)
HCT: 28.2 % — ABNORMAL LOW (ref 36.0–46.0)
Hemoglobin: 8.7 g/dL — ABNORMAL LOW (ref 12.0–15.0)
Lymphocytes Relative: 10 % — ABNORMAL LOW (ref 12–46)
Lymphs Abs: 0.9 10*3/uL (ref 0.7–4.0)
Monocytes Absolute: 0.5 10*3/uL (ref 0.1–1.0)
Neutro Abs: 7.2 10*3/uL (ref 1.7–7.7)
RBC: 2.99 MIL/uL — ABNORMAL LOW (ref 3.87–5.11)
RDW: 15.8 % — ABNORMAL HIGH (ref 11.5–15.5)
WBC: 8.9 10*3/uL (ref 4.0–10.5)

## 2011-12-01 LAB — GLUCOSE, CAPILLARY: Glucose-Capillary: 168 mg/dL — ABNORMAL HIGH (ref 70–99)

## 2011-12-01 MED ORDER — FUROSEMIDE 10 MG/ML IJ SOLN
40.0000 mg | Freq: Once | INTRAMUSCULAR | Status: AC
  Administered 2011-12-01: 40 mg via INTRAVENOUS
  Filled 2011-12-01: qty 4

## 2011-12-01 MED ORDER — FLUCONAZOLE 100 MG PO TABS
100.0000 mg | ORAL_TABLET | Freq: Every day | ORAL | Status: DC
  Administered 2011-12-02 – 2011-12-03 (×2): 100 mg via ORAL
  Filled 2011-12-01 (×2): qty 1

## 2011-12-01 MED ORDER — FLUCONAZOLE 200 MG PO TABS
200.0000 mg | ORAL_TABLET | Freq: Once | ORAL | Status: AC
  Administered 2011-12-01: 200 mg via ORAL
  Filled 2011-12-01: qty 1

## 2011-12-01 MED ORDER — MUPIROCIN 2 % EX OINT
TOPICAL_OINTMENT | CUTANEOUS | Status: AC
  Filled 2011-12-01: qty 22

## 2011-12-01 NOTE — Progress Notes (Signed)
[facility] PROGRESS NOTE  [name] [name] AVW:[id number] DOB: 11/27/1923 DOA: 11/28/2011 PCP: [name] Goodpasture, PA  Assessment/Plan: Principal Problem:  *Upper GI bleed Active Problems:  Hyperlipidemia  Atrial fibrillation  Diastolic CHF, chronic  Acute blood loss anemia  Colitis, Clostridium difficile  Candida esophagitis  #1 upper GI bleed Questionable etiology. Patient is status post upper endoscopy with negative source of bleeding however did show extensive esophageal candidiasis. H&H has remained stable. Per GI okay to resume anticoagulation. Follow.  #2 Candida esophagitis Will check for HIV. Will place on fluconazole 200 mg x1 by mouth, then fluconazole 100 mg daily x13 more days. Will discontinue nystatin.  #3 acute blood loss anemia Secondary to problem #1. H&H is stable.  #4 recurrent C. difficile colitis Per daughter this is patient's third episode of a C. difficile colitis. Patient with semi formed stool today. Patient is currently afebrile. Continue oral vancomycin. Continue florostor. Monitor.  #5 A. fib Patient does have a heart rate in the low 100s. May be secondary to #4. Patient does have a borderline blood pressure. Continue Cardizem and amiodarone for rate control.  Xarelto. Monitor H&H.  #6 chronic diastolic CHF Stable. Continue Lasix, lovaza.  Code Status: Full Family Communication: Updated patient and daughter at bedside Disposition Plan: Home with daughter when medically stable.   Brief narrative: [name] [name] is 78 year old pleasant female with an extensive medical history including but not limited to COPD, CHF, C. Diff colitis, recently diagnosed atrial fibrillation (on xarelto) who was at her PCP Monday for regular follow up visit and was complaining of dark stool for about 1 week prior to this admission. Patient was directly admitted to telemetry floor. Upon initial evaluation patient was found to have a hemoglobin of 9.2 but no active bleed.  Gastroenterology [name] [name]) consulted.    Consultants:  Gastroenterology Dr. Matthias Hughs 11/29/2011  Procedures:  Upper endoscopy 11/29/2011  Antibiotics:  Oral vancomycin 11/30/2011  Oral fluconazole 12/01/2011  HPI/Subjective: Patient states had 1 semi formed  bowel movements today. No other complaints.  Objective: Filed Vitals:   11/30/11 2105 12/01/11 0443 12/01/11 0832 12/01/11 1112  BP: 94/49 112/67  106/44  Pulse: 97 102    Temp: 98.6 F (37 C) 98.5 F (36.9 C)    TempSrc: Oral Oral    Resp:  18    Height:      Weight:  77.1 kg (169 lb 15.6 oz)    SpO2: 94% 95% 95%     Intake/Output Summary (Last 24 hours) at 12/01/11 1414 Last data filed at 12/01/11 0845  Gross per 24 hour  Intake    600 ml  Output      0 ml  Net    600 ml   Filed Weights   11/29/11 0606 11/30/11 0556 12/01/11 0443  Weight: 78.2 kg (172 lb 6.4 oz) 77.293 kg (170 lb 6.4 oz) 77.1 kg (169 lb 15.6 oz)    Exam:   General:  NAD  Cardiovascular: Irregularly irregular, no JVD. No lower extremity edema.  Respiratory: ctab  Abdomen: soft/nt/nd/+bs  Data Reviewed: Basic Metabolic Panel:  Lab 12/01/11 8295 11/30/11 0855 11/29/11 0440 11/28/11 1354  NA 136 139 141 141  K 4.0 4.0 4.0 3.8  CL 101 102 104 103  CO2 26 30 30 30   GLUCOSE 97 104* 105* 81  BUN 14 16 23 21   CREATININE 1.16* 1.21* 1.33* 1.26*  CALCIUM 8.6 8.6 8.6 9.1  MG -- -- -- 2.2  PHOS -- -- -- 3.6  Liver Function Tests:  Lab 11/29/11 0440 11/28/11 1354  AST 11 13  ALT 10 11  ALKPHOS 60 66  BILITOT 0.4 0.5  PROT 5.8* 6.4  ALBUMIN 2.5* 2.9*   No results found for this basename: LIPASE:5,AMYLASE:5 in the last 168 hours No results found for this basename: AMMONIA:5 in the last 168 hours CBC:  Lab 12/01/11 0415 11/30/11 0855 11/29/11 0440 11/28/11 1354  WBC 8.9 11.6* 7.1 6.6  NEUTROABS 7.2 -- -- 5.2  HGB 8.7* 8.8* 8.8* 9.2*  HCT 28.2* 28.2* 28.4* 29.2*  MCV 94.3 93.4 93.7 93.0  PLT 206 193 193 210   Cardiac  Enzymes: No results found for this basename: CKTOTAL:5,CKMB:5,CKMBINDEX:5,TROPONINI:5 in the last 168 hours BNP (last 3 results)  Basename 11/28/11 1353 10/01/11 0718 09/29/11 0008  PROBNP 5095.0* 1563.0* 2283.0*   CBG:  Lab 12/01/11 1149 12/01/11 0801 11/30/11 0746 11/29/11 0724  GLUCAP 168* 94 124* 93    Recent Results (from the past 240 hour(s))  CLOSTRIDIUM DIFFICILE BY PCR     Status: Abnormal   Collection Time   11/29/11  8:38 PM      Component Value Range Status Comment   C difficile by pcr POSITIVE (*) NEGATIVE Final      Studies: No results found.  Scheduled Meds:    . allopurinol  100 mg Oral Daily  . amiodarone  200 mg Oral BID  . atorvastatin  40 mg Oral Daily  . diltiazem  240 mg Oral Daily  . Fluticasone-Salmeterol  1 puff Inhalation BID  . furosemide  40 mg Oral Daily  . mupirocin ointment      . nystatin  5 mL Oral QID  . omega-3 acid ethyl esters  1 g Oral BID  . raloxifene  60 mg Oral Daily  . Rivaroxaban  15 mg Oral QAC supper  . saccharomyces boulardii  250 mg Oral BID  . sodium chloride  500 mL Intravenous Once  . sodium chloride  3 mL Intravenous Q12H  . vancomycin  125 mg Oral QID   Followed by  . vancomycin  125 mg Oral BID   Followed by  . vancomycin  125 mg Oral Daily   Followed by  . vancomycin  125 mg Oral QODAY   Followed by  . vancomycin  125 mg Oral Q3 days  . vitamin A & D       Continuous Infusions:   Principal Problem:  *Upper GI bleed Active Problems:  Hyperlipidemia  Atrial fibrillation  Diastolic CHF, chronic  Acute blood loss anemia  Colitis, Clostridium difficile  Candida esophagitis    Time spent: > 35 mins    [facility]  [facility] Pager [phone]. If 8PM-8AM, please contact night-coverage at [email or web address], password [facility] 12/01/2011, 2:14 PM  LOS: 3 days

## 2011-12-01 NOTE — Progress Notes (Signed)
Patient was sitting asleep in chair and had 4 beats of vtach. Dr. Janee Morn was made aware. New order placed. Will continue to monitor patient.Setzer, Don Broach

## 2011-12-01 NOTE — Progress Notes (Signed)
Physical Therapy Treatment Patient Details Name: [name] [name] MRN: [id number] DOB: 1923-06-05 Today's Date: 12/01/2011 Time: 4098-1191 PT Time Calculation (min): 20 min  PT Assessment / Plan / Recommendation Comments on Treatment Session  78 y.o. female admitted to [facility] for GIB and c-diff.  She is progressing well with increased gait distance today, but O2 sats on RA were poor with gait.  She reports she doesn't use oxygen at home.  O2 ranged from 81-88% while walking.  DOE increased to 3/4 and pt had to take 3 standing rest breaks for pursed lip breathing while walking.      Follow Up Recommendations  Home health PT    Barriers to Discharge  none      Equipment Recommendations  None recommended by PT    Recommendations for Other Services  NA  Frequency Min 3X/week   Plan Discharge plan remains appropriate    Precautions / Restrictions   monitor O2  Pertinent Vitals/Pain O2 during gait on RA 81-88%, DOE increased to 3/4.  No reports of pain    Mobility  Transfers Sit to Stand: 5: Supervision;From chair/3-in-1;With upper extremity assist;With armrests Stand to Sit: 5: Supervision;To chair/3-in-1;With armrests;With upper extremity assist Details for Transfer Assistance: supervision for safety Ambulation/Gait Ambulation/Gait Assistance: 4: Min guard Ambulation Distance (Feet): 250 Feet Assistive device: 1 person hand held assist Ambulation/Gait Assistance Details: Pt took three standing rest breaks due to increased DOE to 3/4 with gait.  O2 sats 81-88% on RA with gait.  Encouraged pursed lip breathing during rest breaks and O2 increased into the upper 80s, but never into the 90s with gait.   Gait Pattern: Step-through pattern      PT Goals Acute Rehab PT Goals PT Goal: Ambulate - Progress: Progressing toward goal  Visit Information  Last PT Received On: 12/01/11 Assistance Needed: +1    Subjective Data  Subjective: Pt reports that she would like to go for a walk.       Cognition  Overall Cognitive Status: Appears within functional limits for tasks assessed/performed       End of Session PT - End of Session Activity Tolerance: Patient limited by fatigue;Treatment limited secondary to medical complications (Comment) (limited by DOE) Patient left: in chair;with call bell/phone within reach        [place] B. Karsen Fellows, PT, DPT [phone]   12/01/2011, 3:04 PM

## 2011-12-02 LAB — BASIC METABOLIC PANEL
CO2: 31 mEq/L (ref 19–32)
Calcium: 8.8 mg/dL (ref 8.4–10.5)
Chloride: 101 mEq/L (ref 96–112)
Creatinine, Ser: 1.49 mg/dL — ABNORMAL HIGH (ref 0.50–1.10)
GFR calc Af Amer: 35 mL/min — ABNORMAL LOW (ref >90)
Sodium: 139 mEq/L (ref 135–145)

## 2011-12-02 LAB — HIV ANTIBODY (ROUTINE TESTING W REFLEX): HIV: NONREACTIVE

## 2011-12-02 LAB — CBC
MCH: 29.4 pg (ref 26.0–34.0)
MCV: 95 fL (ref 78.0–100.0)
Platelets: 199 10*3/uL (ref 150–400)
RBC: 2.82 MIL/uL — ABNORMAL LOW (ref 3.87–5.11)
RDW: 15.6 % — ABNORMAL HIGH (ref 11.5–15.5)
WBC: 6.4 10*3/uL (ref 4.0–10.5)

## 2011-12-02 LAB — GLUCOSE, CAPILLARY: Glucose-Capillary: 82 mg/dL (ref 70–99)

## 2011-12-02 MED ORDER — FUROSEMIDE 10 MG/ML IJ SOLN
40.0000 mg | Freq: Once | INTRAMUSCULAR | Status: AC
  Administered 2011-12-02: 40 mg via INTRAVENOUS
  Filled 2011-12-02: qty 4

## 2011-12-02 MED ORDER — FUROSEMIDE 40 MG PO TABS
40.0000 mg | ORAL_TABLET | Freq: Every day | ORAL | Status: DC
  Administered 2011-12-03: 40 mg via ORAL
  Filled 2011-12-02: qty 1

## 2011-12-02 NOTE — Progress Notes (Signed)
[facility] PROGRESS NOTE  Kade Rickels Frechette WUJ:[id number] DOB: [birth year] DOA: 11/28/2011 PCP: Carilyn Goodpasture, PA  Assessment/Plan: Principal Problem:  *Upper GI bleed Active Problems:  Hyperlipidemia  Atrial fibrillation  Diastolic CHF, chronic  Acute blood loss anemia  Colitis, Clostridium difficile  Candida esophagitis  #1 upper GI bleed Questionable etiology. Patient is status post upper endoscopy with negative source of bleeding however did show extensive esophageal candidiasis. H&H has remained stable. Per GI okay to resume anticoagulation. Follow.  #2 Candida esophagitis HIV is negative. Continue fluconazole.   #3 acute blood loss anemia Secondary to problem #1. H&H is stable.  #4 recurrent C. difficile colitis Per daughter this is patient's third episode of a C. difficile colitis. Patient with semi formed stool today. Patient is currently afebrile. Continue oral vancomycin. Continue florostor. Monitor.  #5 A. fib Continue Cardizem and amiodarone for rate control.  Xarelto. Monitor H&H.  #6 chronic diastolic CHF Stable. Continue Lasix, lovaza.  #7 hypoxia Chest x-ray with atelectasis and bilateral infiltrates concerning for volume overload. Patient was given IV Lasix last night and this morning with some improvement in her hypoxia. Will order home O2 and follow. Will resume oral Lasix in the morning.  Code Status: Full Family Communication: Updated patient and daughter at bedside Disposition Plan: Home with daughter when medically stable.   Brief narrative: [name] [name] is 78 year old pleasant female with an extensive medical history including but not limited to COPD, CHF, C. Diff colitis, recently diagnosed atrial fibrillation (on xarelto) who was at her PCP Monday for regular follow up visit and was complaining of dark stool for about 1 week prior to this admission. Patient was directly admitted to telemetry floor. Upon initial evaluation patient was found to  have a hemoglobin of 9.2 but no active bleed. Gastroenterology Deboraha Sprang) consulted.    Consultants:  Gastroenterology Dr. Matthias Hughs 11/29/2011  Procedures:  Upper endoscopy 11/29/2011  Antibiotics:  Oral vancomycin 11/30/2011  Oral fluconazole 12/01/2011  HPI/Subjective: Patient states  bowel movements improving. No other complaints.  Objective: Filed Vitals:   12/02/11 0814 12/02/11 0940 12/02/11 0943 12/02/11 1510  BP:    101/67  Pulse:    80  Temp:    98.7 F (37.1 C)  TempSrc:    Oral  Resp:    18  Height:      Weight:      SpO2: 94% 79% 92% 94%    Intake/Output Summary (Last 24 hours) at 12/02/11 1653 Last data filed at 12/02/11 1510  Gross per 24 hour  Intake    480 ml  Output   2350 ml  Net  -1870 ml   Filed Weights   11/30/11 0556 12/01/11 0443 12/02/11 0534  Weight: 77.293 kg (170 lb 6.4 oz) 77.1 kg (169 lb 15.6 oz) 76.885 kg (169 lb 8 oz)    Exam:   General:  NAD  Cardiovascular: Irregularly irregular, no JVD. No lower extremity edema.  Respiratory: ctab  Abdomen: soft/nt/nd/+bs  Data Reviewed: Basic Metabolic Panel:  Lab 12/02/11 9562 12/01/11 1514 12/01/11 0415 11/30/11 0855 11/29/11 0440 11/28/11 1354  NA 139 -- 136 139 141 141  K 3.7 -- 4.0 4.0 4.0 3.8  CL 101 -- 101 102 104 103  CO2 31 -- 26 30 30 30   GLUCOSE 104* -- 97 104* 105* 81  BUN 18 -- 14 16 23 21   CREATININE 1.49* -- 1.16* 1.21* 1.33* 1.26*  CALCIUM 8.8 -- 8.6 8.6 8.6 9.1  MG -- 2.0 -- -- --  2.2  PHOS -- -- -- -- -- 3.6   Liver Function Tests:  Lab 11/29/11 0440 11/28/11 1354  AST 11 13  ALT 10 11  ALKPHOS 60 66  BILITOT 0.4 0.5  PROT 5.8* 6.4  ALBUMIN 2.5* 2.9*   No results found for this basename: LIPASE:5,AMYLASE:5 in the last 168 hours No results found for this basename: AMMONIA:5 in the last 168 hours CBC:  Lab 12/02/11 0425 12/01/11 0415 11/30/11 0855 11/29/11 0440 11/28/11 1354  WBC 6.4 8.9 11.6* 7.1 6.6  NEUTROABS -- 7.2 -- -- 5.2  HGB 8.3* 8.7* 8.8*  8.8* 9.2*  HCT 26.8* 28.2* 28.2* 28.4* 29.2*  MCV 95.0 94.3 93.4 93.7 93.0  PLT 199 206 193 193 210   Cardiac Enzymes: No results found for this basename: CKTOTAL:5,CKMB:5,CKMBINDEX:5,TROPONINI:5 in the last 168 hours BNP (last 3 results)  Basename 11/28/11 1353 10/01/11 0718 09/29/11 0008  PROBNP 5095.0* 1563.0* 2283.0*   CBG:  Lab 12/02/11 0740 12/01/11 1149 12/01/11 0801 11/30/11 0746 11/29/11 0724  GLUCAP 82 168* 94 124* 93    Recent Results (from the past 240 hour(s))  CLOSTRIDIUM DIFFICILE BY PCR     Status: Abnormal   Collection Time   11/29/11  8:38 PM      Component Value Range Status Comment   C difficile by pcr POSITIVE (*) NEGATIVE Final      Studies: Dg Chest 2 View  12/01/2011  *RADIOLOGY REPORT*  Clinical Data: Hypoxia, pneumonia  CHEST - 2 VIEW  Comparison: 11/18/2011  Findings: Enlargement of cardiac silhouette. Slightly prominent right paratracheal soft tissue, accentuated by rotation to the right. Bilateral upper lobe and right basilar infiltrates, increased in left upper lobe since previous exam. Bibasilar effusions and minimal atelectasis. No pneumothorax. Bones demineralized.  IMPRESSION: Bilateral pulmonary infiltrates, increased in left upper lobe since previous exam. Enlargement of cardiac silhouette with bibasilar effusions and minimal atelectasis.   Original Report Authenticated By: Lollie Marrow, M.D.     Scheduled Meds:    . allopurinol  100 mg Oral Daily  . amiodarone  200 mg Oral BID  . atorvastatin  40 mg Oral Daily  . diltiazem  240 mg Oral Daily  . fluconazole  100 mg Oral Daily  . Fluticasone-Salmeterol  1 puff Inhalation BID  . furosemide  40 mg Intravenous Once  . furosemide  40 mg Intravenous Once  . furosemide  40 mg Oral Daily  . mupirocin ointment      . mupirocin ointment      . omega-3 acid ethyl esters  1 g Oral BID  . raloxifene  60 mg Oral Daily  . Rivaroxaban  15 mg Oral QAC supper  . saccharomyces boulardii  250 mg Oral  BID  . sodium chloride  3 mL Intravenous Q12H  . vancomycin  125 mg Oral QID   Followed by  . vancomycin  125 mg Oral BID   Followed by  . vancomycin  125 mg Oral Daily   Followed by  . vancomycin  125 mg Oral QODAY   Followed by  . vancomycin  125 mg Oral Q3 days  . DISCONTD: furosemide  40 mg Oral Daily   Continuous Infusions:   Principal Problem:  *Upper GI bleed Active Problems:  Hyperlipidemia  Atrial fibrillation  Diastolic CHF, chronic  Acute blood loss anemia  Colitis, Clostridium difficile  Candida esophagitis    Time spent: > 35 mins    [facility]  [facility] Pager [phone]. If 8PM-8AM, please contact  night-coverage at [email or web address], password [facility] 12/02/2011, 4:53 PM  LOS: 4 days

## 2011-12-02 NOTE — Progress Notes (Signed)
Patient SPO2 79% on RA at rest.

## 2011-12-02 NOTE — Progress Notes (Signed)
Physical Therapy Treatment Patient Details Name: [name] [name] MRN: [id number] DOB: 05-Feb-1924 Today's Date: 12/02/2011 Time: 1012-1028 PT Time Calculation (min): 16 min  PT Assessment / Plan / Recommendation Comments on Treatment Session  Tolerated ambulation in hallway with 1 person HHA, however is somewhat unsteady and uses handrails in hallway intermittently for balance.  Prior to session, RN states that pts O2 was 79% on RA, therefore applied 1L O2.  Ambulated with 1L O2 with O2 sats prior to ambulation at 92%, during amb at 87-88% and following ambulation was 91%.  Pt may have improved saturation if ambulated on 2LO2.      Follow Up Recommendations  Home health PT    Barriers to Discharge        Equipment Recommendations  None recommended by PT    Recommendations for Other Services    Frequency Min 3X/week   Plan Discharge plan remains appropriate    Precautions / Restrictions Precautions Precaution Comments: decreased sats Restrictions Weight Bearing Restrictions: No   Pertinent Vitals/Pain No pain, SOB noted with ambulation.      Mobility  Bed Mobility Bed Mobility: Not assessed (Pt in recliner when PT arrived. ) Transfers Transfers: Sit to Stand;Stand to Sit Sit to Stand: 5: Supervision;With upper extremity assist;With armrests;From chair/3-in-1;From toilet Stand to Sit: 5: Supervision;To chair/3-in-1;With armrests;With upper extremity assist;To toilet Details for Transfer Assistance: Supervision for safety.  Pt demos good hand placement/safety when sitting/standing.  Ambulation/Gait Ambulation/Gait Assistance: 4: Min guard Ambulation Distance (Feet): 250 Feet Assistive device: 1 person hand held assist Ambulation/Gait Assistance Details: Assist to steady intermittently.  Also noted that pt used hand rails in hallway intermittently for balance.  Recommend SPC at next visit.  Gait Pattern: Step-through pattern Gait velocity: decreased    Exercises     PT  Diagnosis:    PT Problem List:   PT Treatment Interventions:     PT Goals Acute Rehab PT Goals PT Goal Formulation: With patient/family Time For Goal Achievement: 12/10/11 Potential to Achieve Goals: Good Pt will Ambulate: >150 feet;with supervision PT Goal: Ambulate - Progress: Progressing toward goal  Visit Information  Last PT Received On: 12/02/11 Assistance Needed: +1    Subjective Data  Subjective: I need to use the restroom first.  Patient Stated Goal: To go home.    Cognition  Overall Cognitive Status: Appears within functional limits for tasks assessed/performed Arousal/Alertness: Awake/alert Orientation Level: Appears intact for tasks assessed Behavior During Session: [facility] for tasks performed    Balance     End of Session PT - End of Session Equipment Utilized During Treatment: Oxygen Activity Tolerance: Patient limited by fatigue Patient left: in chair;with call bell/phone within reach Nurse Communication: Mobility status   GP     Page, Meribeth Mattes 12/02/2011, 11:08 AM

## 2011-12-03 LAB — GLUCOSE, CAPILLARY: Glucose-Capillary: 88 mg/dL (ref 70–99)

## 2011-12-03 LAB — BASIC METABOLIC PANEL
Calcium: 9.2 mg/dL (ref 8.4–10.5)
Creatinine, Ser: 1.31 mg/dL — ABNORMAL HIGH (ref 0.50–1.10)
GFR calc Af Amer: 41 mL/min — ABNORMAL LOW (ref >90)
Sodium: 140 mEq/L (ref 135–145)

## 2011-12-03 LAB — CBC
Platelets: 219 10*3/uL (ref 150–400)
RBC: 3.04 MIL/uL — ABNORMAL LOW (ref 3.87–5.11)
RDW: 15.3 % (ref 11.5–15.5)
WBC: 6.1 10*3/uL (ref 4.0–10.5)

## 2011-12-03 MED ORDER — VANCOMYCIN 50 MG/ML ORAL SOLUTION: 125.0000 mg | Freq: Four times a day (QID) | ORAL | Status: DC

## 2011-12-03 MED ORDER — POTASSIUM CHLORIDE CRYS ER 20 MEQ PO TBCR
40.0000 meq | EXTENDED_RELEASE_TABLET | Freq: Once | ORAL | Status: AC
  Administered 2011-12-03: 40 meq via ORAL
  Filled 2011-12-03: qty 2

## 2011-12-03 MED ORDER — LORATADINE-PSEUDOEPHEDRINE ER 5-120 MG PO TB12: 1.0000 | ORAL_TABLET | Freq: Two times a day (BID) | ORAL | Status: DC

## 2011-12-03 MED ORDER — LORATADINE 10 MG PO TABS: 10.0000 mg | ORAL_TABLET | Freq: Every day | ORAL | Status: DC

## 2011-12-03 MED ORDER — CETIRIZINE HCL 10 MG PO TABS
10.0000 mg | ORAL_TABLET | Freq: Every day | ORAL | Status: DC
  Filled 2011-12-03: qty 1

## 2011-12-03 MED ORDER — FLUCONAZOLE 100 MG PO TABS: 100.0000 mg | ORAL_TABLET | Freq: Every day | ORAL | Status: AC

## 2011-12-03 NOTE — Discharge Summary (Signed)
Physician Discharge Summary  [name] [name] [name] [name] ZOX:[id number] DOB: 1923/07/31 DOA: 11/28/2011  PCP: Carilyn Goodpasture, PA  Admit date: 11/28/2011 Discharge date: 12/03/2011  Recommendations for Outpatient Follow-up:  1. Patient will need to followup with PCP one week post discharge. On followup patient will need a CBC checked to followup on her hemoglobin. Patient will also need a basic metabolic profile to followup on electrolytes and renal function. 2. Patient has been discharged home on home O2 and her oxygenation status will need to be reassessed on followup.  Discharge Diagnoses:  Principal Problem:  *Upper GI bleed Active Problems:  Hyperlipidemia  Atrial fibrillation  Diastolic CHF, chronic  Acute blood loss anemia  Colitis, Clostridium difficile  Candida esophagitis   Discharge Condition: Stable and improved  Diet recommendation: Low sodium diet  Filed Weights   12/01/11 0443 12/02/11 0534 12/03/11 0619  Weight: 77.1 kg (169 lb 15.6 oz) 76.885 kg (169 lb 8 oz) 76.9 kg (169 lb 8.5 oz)    History of present illness:  [name] is 78 year old pleasant female with an extensive medical history including but not limited to COPD, CHF, C. Diff colitis, recently diagnosed atrial fibrillation (on xarelto) who was at her PCP Monday for regular follow up visit and was complaining of dark stool for about 1 week prior to this admission. Patient was directly admitted to telemetry floor. Upon initial evaluation patient was found to have a hemoglobin of 9.2 but no active bleed. Gastroenterology Deboraha Sprang) consulted.    Hospital Course:   #1 upper GI bleed On admission was concern that patient did have a upper GI bleed. A GI consultation was obtained and patient was seen in consultation by Dr. Matthias Hughs of gastroenterology on 11/29/2011. Patient underwent an upper endoscopy on 11/29/2011 which was negative for any source of bleeding. Upper endoscopy however did show extensive esophageal candidiasis.  The patient was initially placed on nystatin. Patient's hemoglobin remained stable throughout the hospitalization patient remained hemodynamically stable and did not have any further noticeable GI bleed. It was felt per GI that resumption of patient's anticoagulation was reasonable the patient will be needed to be carefully monitored for evidence of further bleeding. Patient did not have any further bleed GI bleeds rather hospitalization and will be discharged in stable and improved condition.  #2 Candida esophagitis During the workup of problem #1 patient underwent upper endoscopy and was noted to have extensive Candida esophagitis endoscopy report. A HIV which was checked and was nonreactive. Patient was initially placed on nystatin however this was subsequently changed to fluconazole and patient will need 12 more days of oral fluconazole to complete a course of antifungal therapy. This was discussed with infectious diseases.  #3 acute blood loss anemia Secondary to problem #1. Remained stable throughout the hospitalization. Patient's H&H remained stable.  #4 recurrent C. difficile colitis During the hospitalization patient was noted to have multiple loose stools patient did have a history of prior differential colitis on 2 prior occasions prior to admission. [name] was obtained which came back positive. Patient was subsequently placed on May oral vancomycin taper as well as florastor with clinical improvement. By day of discharge patient was having solid solid stools will be discharged home on a vancomycin taper as well as florastor. Patient will followup with PCP as outpatient.  #5 hypoxia During the hospitalization patient was noted to be hypoxic. Patient had recently been treated for course of pneumonia and finished her course of antibiotic therapy. Chest x-ray which was obtained  was concerning for bilateral infiltrates concerning for volume overload. This is IV fluids with saline  lock. Patient was given a couple of doses of IV Lasix however patient was still hypoxic on ambulation and a such will be discharged home on home oxygen with close followup with PCP as outpatient. Patient was resumed back on her home regimen of oral Lasix.  The rest of patient's chronic medical issues remained stable throughout the hospitalization the patient was discharged in stable and improved condition.  Procedures: Upper endoscopy 11/29/2011 CXR 12/01/11   Consultations: Gastroenterology Dr. Matthias Hughs 11/29/2011   Discharge Exam: Filed Vitals:   12/02/11 2104 12/03/11 0865 12/03/11 0838 12/03/11 1058  BP: 106/57 131/75    Pulse: 94 92    Temp: 98.8 F (37.1 C) 98.5 F (36.9 C)    TempSrc: Oral Oral    Resp: 17 16    Height:      Weight:  76.9 kg (169 lb 8.5 oz)    SpO2: 95% 93% 92% 84%    General: NAD Cardiovascular: iRREGULARLY IRREGULAR Respiratory: CTAB  Discharge Instructions  Discharge Orders    Future Appointments: Provider: Department: Dept Phone: Center:   12/09/2011 1:30 PM Rosalio Macadamia, NP [facility] [phone] None     Future Orders Please Complete By Expires   For home use only DME oxygen      Questions: Responses:   Beitz or (Route) Nasal cannula   Liters per Minute 2   Frequency Continuous   Oxygen conserving device    Diet - low sodium heart healthy      Increase activity slowly      Discharge instructions      Comments:   Follow up with [facility] in 1 week       Medication List     As of 12/03/2011  1:37 PM    STOP taking these medications         levofloxacin 750 MG tablet   Commonly known as: LEVAQUIN      TAKE these medications         allopurinol 100 MG tablet   Commonly known as: ZYLOPRIM   Take 100 mg by mouth daily.      amiodarone 200 MG tablet   Commonly known as: PACERONE   Take 200 mg by mouth 2 (two) times daily.      atorvastatin 40 MG tablet   Commonly known as: LIPITOR   Take 40 mg by mouth  daily.      diltiazem 240 MG 24 hr capsule   Commonly known as: CARDIZEM CD   Take 1 capsule (240 mg total) by mouth daily.      fish oil-omega-3 fatty acids 1000 MG capsule   Take 1 g by mouth 2 (two) times daily.      fluconazole 100 MG tablet   Commonly known as: DIFLUCAN   Take 1 tablet (100 mg total) by mouth daily. Take for 12 days then stop.      Fluticasone-Salmeterol 250-50 MCG/DOSE Aepb   Commonly known as: ADVAIR   Inhale 1 puff into the lungs 2 (two) times daily.      furosemide 40 MG tablet   Commonly known as: LASIX   Take 40 mg by mouth daily.      levalbuterol 45 MCG/ACT inhaler   Commonly known as: XOPENEX HFA   Inhale 1-2 puffs into the lungs every 4 (four) hours as needed for wheezing.      loratadine-pseudoephedrine 5-120 MG  per tablet   Commonly known as: CLARITIN-D 12-hour   Take 1 tablet by mouth 2 (two) times daily. Take for 5 days then stop.      raloxifene 60 MG tablet   Commonly known as: EVISTA   Take 60 mg by mouth daily.      Rivaroxaban 15 MG Tabs tablet   Commonly known as: XARELTO   Take 1 tablet (15 mg total) by mouth daily.      saccharomyces boulardii 250 MG capsule   Commonly known as: FLORASTOR   Take 250 mg by mouth 2 (two) times daily.      vancomycin 50 mg/mL oral solution   Commonly known as: VANCOCIN   Take 2.5 mLs (125 mg total) by mouth 4 (four) times daily. Take 2.5 ml ( 125mg ) 4 times daily x 5 days, then 2.68ml (125mg ) 2 times daily x 7 days, then 2.52ml (125mg ) daily x 7 days then, 2.5 ml ( 125mg ) every 48 hours x 7 days, then 2.71ml (125mg ) every 72 hours x 14 days then stop.           Follow-up Information    Follow up with [facility]. Schedule an appointment as soon as possible for a visit in 1 week.   Contact information:   [address] [place] [address] [phone]           The results of significant diagnostics from this hospitalization (including imaging, microbiology,  ancillary and laboratory) are listed below for reference.    Significant Diagnostic Studies: Dg Chest 2 View  12/01/2011  *RADIOLOGY REPORT*  Clinical Data: Hypoxia, pneumonia  CHEST - 2 VIEW  Comparison: 11/18/2011  Findings: Enlargement of cardiac silhouette. Slightly prominent right paratracheal soft tissue, accentuated by rotation to the right. Bilateral upper lobe and right basilar infiltrates, increased in left upper lobe since previous exam. Bibasilar effusions and minimal atelectasis. No pneumothorax. Bones demineralized.  IMPRESSION: Bilateral pulmonary infiltrates, increased in left upper lobe since previous exam. Enlargement of cardiac silhouette with bibasilar effusions and minimal atelectasis.   Original Report Authenticated By: Lollie Marrow, M.D.    Dg Chest 2 View  11/18/2011  *RADIOLOGY REPORT*  Clinical Data: Cough.  Shortness of breath.  Chest tightness. History of hypertension and COPD.  CHEST - 2 VIEW  Comparison: Two-view chest x-ray 10/31/2011, 09/28/2011, 02/04/2009.  Findings: Cardiac silhouette moderately enlarged but stable. Pulmonary venous hypertension without overt edema.  Airspace consolidation in the right upper lobe.  Bilateral pleural effusions, right greater than left, and associated consolidation in the lower lobes.  Thoracic aorta atherosclerotic, unchanged.  Hilar and mediastinal contours otherwise unremarkable.  Osteopenia and degenerative changes throughout the thoracic spine.  Thoracolumbar scoliosis convex left.  IMPRESSION:  1.  Right upper lobe pneumonia. 2.  Bilateral pleural effusions, right greater than left, and associated passive atelectasis versus pneumonia in the lower lobes. 3.  Stable moderate cardiomegaly.  Pulmonary venous hypertension without overt edema.   Original Report Authenticated By: Arnell Sieving, M.D.     Microbiology: Recent Results (from the past 240 hour(s))  CLOSTRIDIUM DIFFICILE BY PCR     Status: Abnormal   Collection Time    11/29/11  8:38 PM      Component Value Range Status Comment   C difficile by pcr POSITIVE (*) NEGATIVE Final      Labs: Basic Metabolic Panel:  Lab 12/03/11 9562 12/02/11 0425 12/01/11 1514 12/01/11 0415 11/30/11 0855 11/29/11 0440 11/28/11 1354  NA 140  139 -- 136 139 141 --  K 3.4* 3.7 -- 4.0 4.0 4.0 --  CL 99 101 -- 101 102 104 --  CO2 31 31 -- 26 30 30  --  GLUCOSE 91 104* -- 97 104* 105* --  BUN 16 18 -- 14 16 23  --  CREATININE 1.31* 1.49* -- 1.16* 1.21* 1.33* --  CALCIUM 9.2 8.8 -- 8.6 8.6 8.6 --  MG -- -- 2.0 -- -- -- 2.2  PHOS -- -- -- -- -- -- 3.6   Liver Function Tests:  Lab 11/29/11 0440 11/28/11 1354  AST 11 13  ALT 10 11  ALKPHOS 60 66  BILITOT 0.4 0.5  PROT 5.8* 6.4  ALBUMIN 2.5* 2.9*   No results found for this basename: LIPASE:5,AMYLASE:5 in the last 168 hours No results found for this basename: AMMONIA:5 in the last 168 hours CBC:  Lab 12/03/11 0532 12/02/11 0425 12/01/11 0415 11/30/11 0855 11/29/11 0440 11/28/11 1354  WBC 6.1 6.4 8.9 11.6* 7.1 --  NEUTROABS -- -- 7.2 -- -- 5.2  HGB 8.8* 8.3* 8.7* 8.8* 8.8* --  HCT 28.3* 26.8* 28.2* 28.2* 28.4* --  MCV 93.1 95.0 94.3 93.4 93.7 --  PLT 219 199 206 193 193 --   Cardiac Enzymes: No results found for this basename: CKTOTAL:5,CKMB:5,CKMBINDEX:5,TROPONINI:5 in the last 168 hours BNP: BNP (last 3 results)  Basename 11/28/11 1353 10/01/11 0718 09/29/11 0008  PROBNP 5095.0* 1563.0* 2283.0*   CBG:  Lab 12/03/11 0738 12/02/11 0740 12/01/11 1149 12/01/11 0801 11/30/11 0746  GLUCAP 88 82 168* 94 124*    Time coordinating discharge: 60 minutes  Signed:  Piers Baade  [facility] 12/03/2011, 1:37 PM

## 2011-12-05 ENCOUNTER — Other Ambulatory Visit: Payer: Self-pay | Admitting: Cardiovascular Disease

## 2011-12-08 ENCOUNTER — Ambulatory Visit
Admission: RE | Admit: 2011-12-08 | Discharge: 2011-12-08 | Disposition: A | Discharge status: Home / Self Care | Payer: Medicare Other | Source: Ambulatory Visit | Attending: Family Medicine | Admitting: Family Medicine

## 2011-12-08 ENCOUNTER — Other Ambulatory Visit: Payer: Self-pay | Admitting: Family Medicine

## 2011-12-08 DIAGNOSIS — R0902 Hypoxemia: Secondary | ICD-10-CM

## 2011-12-09 ENCOUNTER — Ambulatory Visit: Payer: Medicare Other | Admitting: Nurse Practitioner

## 2011-12-12 ENCOUNTER — Encounter: Payer: Self-pay | Admitting: Cardiovascular Disease

## 2011-12-14 ENCOUNTER — Encounter: Payer: Self-pay | Admitting: Physician Assistant

## 2011-12-14 ENCOUNTER — Ambulatory Visit (INDEPENDENT_AMBULATORY_CARE_PROVIDER_SITE_OTHER): Payer: Medicare Other | Admitting: Physician Assistant

## 2011-12-14 VITALS — BP 122/60 | HR 101 | Resp 18 | Ht 61.0 in | Wt 160.4 lb

## 2011-12-14 DIAGNOSIS — K922 Gastrointestinal hemorrhage, unspecified: Secondary | ICD-10-CM

## 2011-12-14 DIAGNOSIS — I509 Heart failure, unspecified: Secondary | ICD-10-CM

## 2011-12-14 DIAGNOSIS — I2510 Atherosclerotic heart disease of native coronary artery without angina pectoris: Secondary | ICD-10-CM | POA: Insufficient documentation

## 2011-12-14 DIAGNOSIS — I5032 Chronic diastolic (congestive) heart failure: Secondary | ICD-10-CM

## 2011-12-14 DIAGNOSIS — I4891 Unspecified atrial fibrillation: Secondary | ICD-10-CM

## 2011-12-14 DIAGNOSIS — I25810 Atherosclerosis of coronary artery bypass graft(s) without angina pectoris: Secondary | ICD-10-CM

## 2011-12-14 NOTE — Progress Notes (Signed)
[address] [place], [place]  [address] Phone: [phone] Fax:  [phone]  Date:  12/14/2011   Name:  [name] [name]   DOB:  07/31/1923   MRN:  [id number]  PCP:  Carilyn Goodpasture, PA  Primary Cardiologist:  Dr. Verne Carrow  Primary Electrophysiologist:  None    History of Present Illness: [name] [name] is a 78 y.o. female who returns for follow up after recent hospitalization.  She has a hx of CAD, HTN, HL, COPD, carotid stenosis, status post bilateral CEA, AFib, diastolic CHF.    Admitted 09/2011 with dyspnea and noted to have AFib with RVR. V/Q scan was negative for pulmonary embolism. Of note, she had recently been discharged after a prolonged hospitalization in 6/13 for Salmonella enteritis with septic shock. Xarelto was started for anticoagulation. She had signs of volume overload and was treated with IV Lasix.  She was placed on amiodarone and underwent TEE-DCCV.  However, she reverted back to atrial fibrillation.    She was readmitted to the hospital 10/2011 with recurrent CDiff colitis.  She required PRBCs x 1 for worsening anemia.  Last seen by Dr. Verne Carrow 11/08/11.  Plan was to eventually proceed with DCCV once she had recovered from acute issues.    However, she was admitted 9/13-9/16 with pneumonia and again 9/23-9/28 with upper GI bleeding. She presented with dark stools for about one week. She was seen by gastroenterology. EGD was negative for source of bleeding but did demonstrate extensive esophageal candidiasis. She was treated with nystatin and eventually transitioned over to fluconazole. Gastroenterology felt that anticoagulation can be resumed. She began to have diarrhea and C. difficile PCR was positive. She was placed on oral vancomycin. She was noted to be hypoxic. She had recently been treated for pneumonia. Chest x-ray was concerning for edema with small bilateral effusions and vascular congestion.  She was given IV  Lasix and discharged home on oxygen therapy.  Of note, Xarelto was reduced to 15 mg QD due to low GFR while hospitalized for pneumonia.    Since d/c, she is doing ok. Dyspnea with exertion is stable. She is probably class IIb-3. She denies orthopnea or PND. She denies syncope. She does have occasional left sided chest discomfort. This last only seconds. There have been no changes. She has seen her PCP. They initially stopped her Xarelto. However, this was resumed after 3 days. The patient denies any further signs of bleeding. She denies palpitations  Labs (9/13): K 3.4, creatinine 1.31, proBNP 5095, Hgb 8.8, TSH 1.430 Labs (10/13): K 4.1, creatinine 1.6 (done at PCP's office)   Wt Readings from Last 3 Encounters:  12/14/11 160 lb 6.4 oz (72.757 kg)  12/03/11 169 lb 8.5 oz (76.9 kg)  12/03/11 169 lb 8.5 oz (76.9 kg)     Past Medical History  Diagnosis Date  . Hypertension   . Hyperlipidemia   . COPD (chronic obstructive pulmonary disease)   . Osteoporosis   . Arthritis   . H/O hiatal hernia   . Chronic back pain   . CAD (coronary artery disease)     LHC 9/05:  pLAD less than 20%, D1 20-30%, ostial RCA 40-50%, proximal RCA 20%, EF 60%.  . C. difficile diarrhea   . Gout   . Atrial fibrillation     a. failed DCCV => b. amiodarone added  . Carotid stenosis     s/p bilat CEA  . C. difficile colitis     10/2011;  11/2011  . Salmonella enteritis 08/2011    c/b septic shock  . Hx of echocardiogram     a. Echocardiogram 09/29/11: Mild LVH, EF 45-50%, diffuse HK, mild to moderate aortic stenosis, mean gradient 12 mmHg, moderate MAC, mild MR, moderate LAE, moderate RAE, question small secundum ASD with left to right flow not seen in 4 chamber view, PASP 35-39 (mild pulmonary hypertension). ;  b.  TEE on 10/04/11: Mild LVH, EF 55%, mild MR, no defect or PFO  . Pneumonia 11/2101    Current Outpatient Prescriptions  Medication Sig Dispense Refill  . allopurinol (ZYLOPRIM) 100 MG tablet Take 100  mg by mouth daily.       Marland Kitchen amiodarone (PACERONE) 200 MG tablet Take 200 mg by mouth 2 (two) times daily.      Marland Kitchen amiodarone (PACERONE) 400 MG tablet TAKE 1 TAB 2X/DAY X5 DAYS THEN 1 TAB DAILY X3 WEEKS THEN FROM 8-/27/2013 TAKE 1/2 TAB 2 TIMES DAILY  60 tablet  0  . atorvastatin (LIPITOR) 40 MG tablet Take 40 mg by mouth daily.      Marland Kitchen diltiazem (CARDIZEM CD) 240 MG 24 hr capsule Take 1 capsule (240 mg total) by mouth daily.  30 capsule  0  . fish oil-omega-3 fatty acids 1000 MG capsule Take 1 g by mouth 2 (two) times daily.       . fluconazole (DIFLUCAN) 100 MG tablet Take 1 tablet (100 mg total) by mouth daily. Take for 12 days then stop.  12 tablet  0  . Fluticasone-Salmeterol (ADVAIR) 250-50 MCG/DOSE AEPB Inhale 1 puff into the lungs 2 (two) times daily.  60 each  0  . furosemide (LASIX) 40 MG tablet Take 40 mg by mouth daily.      Marland Kitchen guaiFENesin (MUCINEX) 600 MG 12 hr tablet Take 600 mg by mouth as directed.      . levalbuterol (XOPENEX HFA) 45 MCG/ACT inhaler Inhale 1-2 puffs into the lungs every 4 (four) hours as needed for wheezing.  1 Inhaler  12  . loratadine-pseudoephedrine (CLARITIN-D 12 HOUR) 5-120 MG per tablet Take 1 tablet by mouth 2 (two) times daily. Take for 5 days then stop.  10 tablet  0  . raloxifene (EVISTA) 60 MG tablet Take 60 mg by mouth daily.      . Rivaroxaban (XARELTO) 15 MG TABS tablet Take 1 tablet (15 mg total) by mouth daily.  30 tablet  0  . saccharomyces boulardii (FLORASTOR) 250 MG capsule Take 250 mg by mouth 2 (two) times daily.      . vancomycin (VANCOCIN) 50 mg/mL oral solution Take 2.5 mLs (125 mg total) by mouth 4 (four) times daily. Take 2.5 ml ( 125mg ) 4 times daily x 5 days, then 2.29ml (125mg ) 2 times daily x 7 days, then 2.54ml (125mg ) daily x 7 days then, 2.5 ml ( 125mg ) every 48 hours x 7 days, then 2.67ml (125mg ) every 72 hours x 14 days then stop.  130 mL  0    Allergies: Allergies  Allergen Reactions  . Baby Powder (Methylbenzethonium) Shortness Of  Breath  . Demerol (Meperidine) Hives  . Penicillins Hives    History  Substance Use Topics  . Smoking status: Former Smoker -- 1.0 packs/day for 20 years    Types: Cigarettes    Quit date: 09/01/1983  . Smokeless tobacco: Never Used  . Alcohol Use: No     ROS:  Please see the history of present illness.     All other systems  reviewed and negative.   PHYSICAL EXAM: VS:  BP 122/60  Pulse 101  Resp 18  Ht 5\' 1"  (1.549 m)  Wt 160 lb 6.4 oz (72.757 kg)  BMI 30.31 kg/m2  SpO2 91% Well nourished, well developed, in no acute distress HEENT: normal Neck: no JVD at 90 Cardiac:  normal S1, S2; irregularly irregular; no murmur Lungs:  Decreased breath sounds at the bases bilaterally, no wheezing, rhonchi or rales Abd: soft, nontender Ext: no edema Skin: warm and dry Neuro:  CNs 2-12 intact, no focal abnormalities noted  EKG:  Atrial fibrillation, heart rate 101, PVCs      ASSESSMENT AND PLAN:  1. Atrial Fibrillation:  Her rate is reasonably controlled.  Continue current dose of Amiodarone.  Follow up in 3 weeks with Dr. Verne Carrow or me to decide on DCCV. Of note, fluconazole and amiodarone may result in QT prolongation.  However, she has finished her course of fluconazole.    2. Chronic Diastolic CHF:  She has had pleural effusions related to recent pneumonia in the setting of AFib and diastolic CHF and CKD.  Recent creatinine stable.  Would continue current Rx for now.  PCP plans to recheck BMET Monday.  3. GI Bleed:  Thought to be related to candida esophagitis.  She was cleared by GI to resume Xarelto.  Hopefully, she will note have any further bleeding.  CBC is being followed by PCP.   4. Chronic Kidney Disease:  She is being referred to nephrology.  5. Coronary Artery Disease:  No angina.  She is not on ASA as she is on Xarelto.   6. Esophageal Candidiasis:  As noted, she has completed Fluconazole.  If this recurs, consider a different agent with ongoing  amiodarone Rx.     Signed, Tereso Newcomer, PA-C  10:07 AM 12/14/2011

## 2011-12-14 NOTE — Patient Instructions (Addendum)
Your physician recommends that you schedule a follow-up appointment in: 01/05/12 @ 10:15 AM WITH DR. Clifton James  NO CHANGES WERE MADE TODAY

## 2011-12-14 NOTE — Progress Notes (Signed)
Thanks for seeing her [name] [name]. chris

## 2011-12-16 ENCOUNTER — Telehealth: Payer: Self-pay | Admitting: Cardiovascular Disease

## 2011-12-16 NOTE — Telephone Encounter (Signed)
Spoke with pt and clarified with her that she should be taking 200 mg amiodarone twice daily.  She is taking half of a 400 mg twice daily and is aware to continue this dose.

## 2011-12-16 NOTE — Telephone Encounter (Signed)
New problem:  Seen [name] [name] on 10/9. Need clarification of medication  Amiodarone 200 mg or 400 mg.

## 2011-12-29 ENCOUNTER — Ambulatory Visit
Admission: RE | Admit: 2011-12-29 | Discharge: 2011-12-29 | Disposition: A | Discharge status: Home / Self Care | Payer: Medicare Other | Source: Ambulatory Visit | Attending: Family Medicine | Admitting: Family Medicine

## 2011-12-29 ENCOUNTER — Other Ambulatory Visit: Payer: Self-pay | Admitting: Family Medicine

## 2011-12-29 DIAGNOSIS — R52 Pain, unspecified: Secondary | ICD-10-CM

## 2012-01-05 ENCOUNTER — Ambulatory Visit: Payer: Medicare Other | Admitting: Cardiovascular Disease

## 2012-01-27 ENCOUNTER — Telehealth: Payer: Self-pay | Admitting: Cardiovascular Disease

## 2012-01-27 DIAGNOSIS — I4891 Unspecified atrial fibrillation: Secondary | ICD-10-CM

## 2012-01-27 MED ORDER — RIVAROXABAN 15 MG PO TABS: 15.0000 mg | ORAL_TABLET | Freq: Every day | ORAL | Status: DC

## 2012-01-27 NOTE — Telephone Encounter (Signed)
plz return call to pt daughter [name] [phone] regarding medication questions.  Patient is out of med but has a different RX, needs to ask questions.

## 2012-01-27 NOTE — Telephone Encounter (Signed)
Spoke with pt's daughter. Pt has been taking Xarelto 15 mg daily and needs refill sent to [facility] on [address].  Will send refill in.

## 2012-01-30 ENCOUNTER — Other Ambulatory Visit: Payer: Self-pay | Admitting: Cardiovascular Disease

## 2012-02-01 ENCOUNTER — Encounter: Payer: Self-pay | Admitting: Cardiovascular Disease

## 2012-02-01 ENCOUNTER — Ambulatory Visit (INDEPENDENT_AMBULATORY_CARE_PROVIDER_SITE_OTHER): Payer: Medicare Other | Admitting: Cardiovascular Disease

## 2012-02-01 VITALS — BP 132/70 | HR 67 | Wt 154.0 lb

## 2012-02-01 DIAGNOSIS — I2510 Atherosclerotic heart disease of native coronary artery without angina pectoris: Secondary | ICD-10-CM

## 2012-02-01 DIAGNOSIS — I4891 Unspecified atrial fibrillation: Secondary | ICD-10-CM

## 2012-02-01 LAB — CBC
HCT: 30.7 % — ABNORMAL LOW (ref 36.0–46.0)
Hemoglobin: 9.6 g/dL — ABNORMAL LOW (ref 12.0–15.0)
MCV: 88.8 fl (ref 78.0–100.0)
Platelets: 216 10*3/uL (ref 150.0–400.0)
RBC: 3.46 Mil/uL — ABNORMAL LOW (ref 3.87–5.11)
WBC: 5.8 10*3/uL (ref 4.5–10.5)

## 2012-02-01 NOTE — Patient Instructions (Addendum)
.  Your physician recommends that you schedule a follow-up appointment in:  3 months. --May 04, 2012 at 1215

## 2012-02-01 NOTE — Progress Notes (Signed)
History of Present Illness: 78 yo WF with history of non-obstructive CAD, atrial fibrillation, HTN, HLD, COPD, carotid artery stenosis s/p bilateral CEA here today for cardiac follow up. Last cath September 2005 with proximal LAD less than 20%, D1 20-30%, ostial RCA 40-50%, proximal RCA 20%, EF 60%.  She was admitted 7/24-10/06/11 with dyspnea and noted to have atrial fibrillation with RVR. V/Q scan was negative for pulmonary embolism. Of note, she had recently been discharged after a prolonged hospitalization in 6/13 for Salmonella enteritis with septic shock. Xarelto was started for anticoagulation. She had signs of volume overload and was treated with IV Lasix. Echocardiogram 09/29/11: Mild LVH, EF 45-50%, diffuse HK, mild to moderate aortic stenosis, mean gradient 12 mmHg, moderate MAC, mild MR, moderate LAE, moderate RAE, question small secundum ASD with left to right flow not seen in 4 chamber view, PASP 35-39 (mild pulmonary hypertension). She was placed on amiodarone and underwent TEE-DCCV. However, she reverted back to atrial fibrillation. Amiodarone was continued. It was felt that she could undergo repeat DCCV after fully loaded with amiodarone. TEE on 10/04/11: Mild LVH, EF 55%, mild MR, no defect or PFO-echo contrast study showed no right-to-left atrial level shunt. She saw Tereso Newcomer, PA-C in our office 10/14/11 and was feeling weak. Anemia was stable and BMET was ok. She developed diarrhea and was readmitted 8/28/013-11/04/11 and found to have recurrent C. Difficile infection which has been treated with Flagyl. Her diarrhea resolved before discharge but her anemia worsened and she was given 1 unit pRBC. She was re-admitted 9/13-9/16/13 with pneumonia and again 9/23-9/28/13 with upper GI bleeding. She presented with dark stools for about one week. She was seen by gastroenterology. EGD was negative for source of bleeding but did demonstrate extensive esophageal candidiasis. She was treated with nystatin  and eventually transitioned over to fluconazole. Gastroenterology felt that anticoagulation could be resumed. She began to have diarrhea and C. difficile PCR was positive. She was placed on oral vancomycin. She was noted to be hypoxic. She had recently been treated for pneumonia. Chest x-ray was concerning for edema with small bilateral effusions and vascular congestion. She was given IV Lasix and discharged home on oxygen therapy. Of note, Xarelto was reduced to 15 mg QD due to low GFR while hospitalized for pneumonia.   She is here today for follow up. No syncope or near syncope. No chest pain. No dyspnea. No orthopnea, PND. She has LE edema. She feels that her strength is increasing. Diarrhea has resolved. No dark stools.  Occasional dizziness when turning head quickly.  Primary Care Physician: [name]   Past Medical History  Diagnosis Date  . Hypertension   . Hyperlipidemia   . COPD (chronic obstructive pulmonary disease)   . Osteoporosis   . Arthritis   . H/O hiatal hernia   . Chronic back pain   . CAD (coronary artery disease)     LHC 9/05:  pLAD less than 20%, D1 20-30%, ostial RCA 40-50%, proximal RCA 20%, EF 60%.  . C. difficile diarrhea   . Gout   . Atrial fibrillation     a. failed DCCV => b. amiodarone added  . Carotid stenosis     s/p bilat CEA  . C. difficile colitis     10/2011;  11/2011  . Salmonella enteritis 08/2011    c/b septic shock  . Hx of echocardiogram     a. Echocardiogram 09/29/11: Mild LVH, EF 45-50%, diffuse HK, mild to moderate aortic stenosis, mean gradient  12 mmHg, moderate MAC, mild MR, moderate LAE, moderate RAE, question small secundum ASD with left to right flow not seen in 4 chamber view, PASP 35-39 (mild pulmonary hypertension). ;  b.  TEE on 10/04/11: Mild LVH, EF 55%, mild MR, no defect or PFO  . Pneumonia 11/2101    Past Surgical History  Procedure Date  . Carotid endarterectomy     bilateral  . Cataract extraction, bilateral   . Joint  replacement 2008    Right Total Knee  . Anal fistula repair   . Left parotidectomy   . Eye surgery   . Flexible sigmoidoscopy 09/02/2011    Procedure: FLEXIBLE SIGMOIDOSCOPY;  Surgeon: Theda Belfast, MD;  Location: [facility] ENDOSCOPY;  Service: Endoscopy;  Laterality: N/A;  . Flexible sigmoidoscopy 09/03/2011    Procedure: FLEXIBLE SIGMOIDOSCOPY;  Surgeon: Theda Belfast, MD;  Location: [facility] ENDOSCOPY;  Service: Endoscopy;  Laterality: N/A;  . Tee without cardioversion 10/04/2011    Procedure: TRANSESOPHAGEAL ECHOCARDIOGRAM (TEE);  Surgeon: Laurey Morale, MD;  Location: [facility] ENDOSCOPY;  Service: Cardiovascular;  Laterality: N/A;  to be carelinked here by 1230-verified 7/29/dl  . Cardioversion 10/04/2011    Procedure: CARDIOVERSION;  Surgeon: Laurey Morale, MD;  Location: [facility] ENDOSCOPY;  Service: Cardiovascular;  Laterality: N/A;  . Esophagogastroduodenoscopy 11/29/2011    Procedure: ESOPHAGOGASTRODUODENOSCOPY (EGD);  Surgeon: Florencia Reasons, MD;  Location: [facility] ENDOSCOPY;  Service: Endoscopy;  Laterality: N/A;  recent h/o resp failure/pneumonia    Current Outpatient Prescriptions  Medication Sig Dispense Refill  . allopurinol (ZYLOPRIM) 100 MG tablet Take 100 mg by mouth daily.       Marland Kitchen amiodarone (PACERONE) 200 MG tablet Take 200 mg by mouth 2 (two) times daily.      Marland Kitchen atorvastatin (LIPITOR) 40 MG tablet Take 40 mg by mouth daily.      Marland Kitchen diltiazem (CARDIZEM CD) 240 MG 24 hr capsule Take 1 capsule (240 mg total) by mouth daily.  30 capsule  0  . fish oil-omega-3 fatty acids 1000 MG capsule Take 1 g by mouth 2 (two) times daily.       . Fluticasone-Salmeterol (ADVAIR) 250-50 MCG/DOSE AEPB Inhale 1 puff into the lungs 2 (two) times daily.  60 each  0  . furosemide (LASIX) 40 MG tablet Take 20 mg by mouth daily.       Marland Kitchen guaiFENesin (MUCINEX) 600 MG 12 hr tablet Take 600 mg by mouth as directed.      . levalbuterol (XOPENEX HFA) 45 MCG/ACT inhaler Inhale 1-2 puffs into the lungs every 4 (four) hours as  needed for wheezing.  1 Inhaler  12  . raloxifene (EVISTA) 60 MG tablet Take 60 mg by mouth daily.      . Rivaroxaban (XARELTO) 15 MG TABS tablet Take 1 tablet (15 mg total) by mouth daily.  30 tablet  6  . saccharomyces boulardii (FLORASTOR) 250 MG capsule Take 250 mg by mouth 2 (two) times daily.        Allergies  Allergen Reactions  . Baby Powder (Methylbenzethonium) Shortness Of Breath  . Demerol (Meperidine) Hives  . Penicillins Hives    History   Social History  . Marital Status: Widowed    Spouse Name: N/A    Number of Children: N/A  . Years of Education: N/A   Occupational History  . Not on file.   Social History Main Topics  . Smoking status: Former Smoker -- 1.0 packs/day for 20 years    Types: Cigarettes  Quit date: 09/01/1983  . Smokeless tobacco: Never Used  . Alcohol Use: No  . Drug Use: No  . Sexually Active: No   Other Topics Concern  . Not on file   Social History Narrative  . No narrative on file    Family History  Problem Relation Age of Onset  . Hemochromatosis Sister   . Diabetes type II Sister     Review of Systems:  As stated in the HPI and otherwise negative.   BP 132/70  Pulse 67  Wt 154 lb (69.854 kg) RU04  Physical Examination: General: Well developed, well nourished, NAD HEENT: OP clear, mucus membranes moist SKIN: warm, dry. No rashes. Neuro: No focal deficits Musculoskeletal: Muscle strength 5/5 all ext Psychiatric: Mood and affect normal Neck: No JVD, no carotid bruits, no thyromegaly, no lymphadenopathy. Lungs:Clear bilaterally, no wheezes, rhonci, crackles Cardiovascular: Regular rate and rhythm. Systolic murmur noted. No gallops or rubs. Abdomen:Soft. Bowel sounds present. Non-tender.  Extremities: No lower extremity edema. Pulses are 2 + in the bilateral DP/PT.  EKG: Sinus with 1st degree AV block. Non-specific ST changes. QTc .  Assessment and Plan:   1. Atrial Fibrillation: She is in sinus today.  Continue current dose of Amiodarone and Diltiazem. She is anti-coagulated with Xarelto 15 mg po Qdaily.   2. Chronic Diastolic CHF: Weight has been stable. She feels that her breathing has been ok. Last LVEF 45-50% July 2013.  3. GI Bleed: Thought to be related to candida esophagitis. She was cleared by GI to resume Xarelto. CBC has been stable. Will check CBC today.  4. Chronic Kidney Disease: She is being seen by nephrology.   5. Coronary Artery Disease: Stable. No angina. She is not on ASA as she is on Xarelto.   6. Esophageal Candidiasis: As noted, she has completed Fluconazole. If this recurs, consider a different agent with ongoing amiodarone Rx.

## 2012-02-03 ENCOUNTER — Other Ambulatory Visit: Payer: Self-pay

## 2012-03-13 ENCOUNTER — Encounter (HOSPITAL_COMMUNITY): Payer: Self-pay | Admitting: Emergency Medicine

## 2012-03-13 ENCOUNTER — Inpatient Hospital Stay (HOSPITAL_COMMUNITY): Payer: Medicare Other

## 2012-03-13 ENCOUNTER — Inpatient Hospital Stay (HOSPITAL_COMMUNITY)
Admission: EM | Admit: 2012-03-13 | Discharge: 2012-03-16 | DRG: 481 | Disposition: A | Discharge status: Skilled Nursing Facility | Payer: Medicare Other | Attending: Emergency Medicine | Admitting: Emergency Medicine

## 2012-03-13 ENCOUNTER — Emergency Department (HOSPITAL_COMMUNITY): Payer: Medicare Other

## 2012-03-13 DIAGNOSIS — I2510 Atherosclerotic heart disease of native coronary artery without angina pectoris: Secondary | ICD-10-CM | POA: Diagnosis present

## 2012-03-13 DIAGNOSIS — E785 Hyperlipidemia, unspecified: Secondary | ICD-10-CM | POA: Diagnosis present

## 2012-03-13 DIAGNOSIS — I359 Nonrheumatic aortic valve disorder, unspecified: Secondary | ICD-10-CM | POA: Diagnosis present

## 2012-03-13 DIAGNOSIS — D638 Anemia in other chronic diseases classified elsewhere: Secondary | ICD-10-CM | POA: Diagnosis present

## 2012-03-13 DIAGNOSIS — J4489 Other specified chronic obstructive pulmonary disease: Secondary | ICD-10-CM | POA: Diagnosis present

## 2012-03-13 DIAGNOSIS — D649 Anemia, unspecified: Secondary | ICD-10-CM | POA: Diagnosis present

## 2012-03-13 DIAGNOSIS — I4891 Unspecified atrial fibrillation: Secondary | ICD-10-CM | POA: Diagnosis present

## 2012-03-13 DIAGNOSIS — I25810 Atherosclerosis of coronary artery bypass graft(s) without angina pectoris: Secondary | ICD-10-CM

## 2012-03-13 DIAGNOSIS — S52599A Other fractures of lower end of unspecified radius, initial encounter for closed fracture: Secondary | ICD-10-CM | POA: Diagnosis present

## 2012-03-13 DIAGNOSIS — I5042 Chronic combined systolic (congestive) and diastolic (congestive) heart failure: Secondary | ICD-10-CM | POA: Diagnosis present

## 2012-03-13 DIAGNOSIS — E860 Dehydration: Secondary | ICD-10-CM | POA: Diagnosis present

## 2012-03-13 DIAGNOSIS — W19XXXA Unspecified fall, initial encounter: Secondary | ICD-10-CM | POA: Diagnosis present

## 2012-03-13 DIAGNOSIS — H832X9 Labyrinthine dysfunction, unspecified ear: Secondary | ICD-10-CM | POA: Diagnosis present

## 2012-03-13 DIAGNOSIS — I509 Heart failure, unspecified: Secondary | ICD-10-CM | POA: Diagnosis present

## 2012-03-13 DIAGNOSIS — S72009A Fracture of unspecified part of neck of unspecified femur, initial encounter for closed fracture: Principal | ICD-10-CM

## 2012-03-13 DIAGNOSIS — Z880 Allergy status to penicillin: Secondary | ICD-10-CM

## 2012-03-13 DIAGNOSIS — I5032 Chronic diastolic (congestive) heart failure: Secondary | ICD-10-CM

## 2012-03-13 DIAGNOSIS — B3781 Candidal esophagitis: Secondary | ICD-10-CM

## 2012-03-13 DIAGNOSIS — D62 Acute posthemorrhagic anemia: Secondary | ICD-10-CM

## 2012-03-13 DIAGNOSIS — A0472 Enterocolitis due to Clostridium difficile, not specified as recurrent: Secondary | ICD-10-CM

## 2012-03-13 DIAGNOSIS — N179 Acute kidney failure, unspecified: Secondary | ICD-10-CM

## 2012-03-13 DIAGNOSIS — S5292XA Unspecified fracture of left forearm, initial encounter for closed fracture: Secondary | ICD-10-CM

## 2012-03-13 DIAGNOSIS — K922 Gastrointestinal hemorrhage, unspecified: Secondary | ICD-10-CM

## 2012-03-13 DIAGNOSIS — J449 Chronic obstructive pulmonary disease, unspecified: Secondary | ICD-10-CM | POA: Diagnosis present

## 2012-03-13 DIAGNOSIS — K219 Gastro-esophageal reflux disease without esophagitis: Secondary | ICD-10-CM | POA: Diagnosis present

## 2012-03-13 DIAGNOSIS — N189 Chronic kidney disease, unspecified: Secondary | ICD-10-CM

## 2012-03-13 DIAGNOSIS — Z79899 Other long term (current) drug therapy: Secondary | ICD-10-CM

## 2012-03-13 DIAGNOSIS — S62102A Fracture of unspecified carpal bone, left wrist, initial encounter for closed fracture: Secondary | ICD-10-CM | POA: Diagnosis present

## 2012-03-13 DIAGNOSIS — Z96659 Presence of unspecified artificial knee joint: Secondary | ICD-10-CM

## 2012-03-13 DIAGNOSIS — Z7901 Long term (current) use of anticoagulants: Secondary | ICD-10-CM

## 2012-03-13 DIAGNOSIS — D689 Coagulation defect, unspecified: Secondary | ICD-10-CM

## 2012-03-13 DIAGNOSIS — S5290XA Unspecified fracture of unspecified forearm, initial encounter for closed fracture: Secondary | ICD-10-CM

## 2012-03-13 DIAGNOSIS — M109 Gout, unspecified: Secondary | ICD-10-CM | POA: Diagnosis present

## 2012-03-13 DIAGNOSIS — Z951 Presence of aortocoronary bypass graft: Secondary | ICD-10-CM

## 2012-03-13 DIAGNOSIS — S72002A Fracture of unspecified part of neck of left femur, initial encounter for closed fracture: Secondary | ICD-10-CM

## 2012-03-13 LAB — URINALYSIS, MICROSCOPIC ONLY
Glucose, UA: NEGATIVE mg/dL
Hgb urine dipstick: NEGATIVE
Ketones, ur: NEGATIVE mg/dL
Protein, ur: NEGATIVE mg/dL
Urine-Other: NONE SEEN

## 2012-03-13 LAB — BASIC METABOLIC PANEL
BUN: 21 mg/dL (ref 6–23)
Calcium: 9.3 mg/dL (ref 8.4–10.5)
Chloride: 101 mEq/L (ref 96–112)
Creatinine, Ser: 1.68 mg/dL — ABNORMAL HIGH (ref 0.50–1.10)
GFR calc Af Amer: 31 mL/min — ABNORMAL LOW (ref >90)

## 2012-03-13 LAB — CBC
HCT: 34.1 % — ABNORMAL LOW (ref 36.0–46.0)
MCH: 29.3 pg (ref 26.0–34.0)
MCV: 88.3 fL (ref 78.0–100.0)
Platelets: 218 10*3/uL (ref 150–400)
RDW: 17.2 % — ABNORMAL HIGH (ref 11.5–15.5)
WBC: 5.6 10*3/uL (ref 4.0–10.5)

## 2012-03-13 LAB — TROPONIN I: Troponin I: <0.3 ng/mL (ref <0.30)

## 2012-03-13 MED ORDER — LIDOCAINE HCL 2 % IJ SOLN
INTRAMUSCULAR | Status: AC
  Administered 2012-03-13: 18:00:00
  Filled 2012-03-13: qty 20

## 2012-03-13 MED ORDER — BUDESONIDE-FORMOTEROL FUMARATE 160-4.5 MCG/ACT IN AERO
2.0000 | INHALATION_SPRAY | Freq: Two times a day (BID) | RESPIRATORY_TRACT | Status: DC
  Administered 2012-03-13 – 2012-03-16 (×6): 2 via RESPIRATORY_TRACT
  Filled 2012-03-13: qty 6

## 2012-03-13 MED ORDER — LEVALBUTEROL TARTRATE 45 MCG/ACT IN AERO
1.0000 | INHALATION_SPRAY | Freq: Four times a day (QID) | RESPIRATORY_TRACT | Status: DC | PRN
  Administered 2012-03-16: 2 via RESPIRATORY_TRACT
  Filled 2012-03-13: qty 15

## 2012-03-13 MED ORDER — RALOXIFENE HCL 60 MG PO TABS
60.0000 mg | ORAL_TABLET | Freq: Every day | ORAL | Status: DC
  Administered 2012-03-14 – 2012-03-16 (×3): 60 mg via ORAL
  Filled 2012-03-13 (×3): qty 1

## 2012-03-13 MED ORDER — MORPHINE SULFATE 2 MG/ML IJ SOLN: 0.5000 mg | INTRAMUSCULAR | Status: DC | PRN

## 2012-03-13 MED ORDER — IBUPROFEN 200 MG PO TABS
400.0000 mg | ORAL_TABLET | Freq: Once | ORAL | Status: AC
  Administered 2012-03-13: 400 mg via ORAL
  Filled 2012-03-13: qty 2

## 2012-03-13 MED ORDER — SODIUM CHLORIDE 0.9 % IV SOLN
INTRAVENOUS | Status: AC
  Administered 2012-03-13: 19:00:00 via INTRAVENOUS

## 2012-03-13 MED ORDER — BIOTENE DRY MOUTH MT LIQD
15.0000 mL | Freq: Two times a day (BID) | OROMUCOSAL | Status: DC
  Administered 2012-03-13 – 2012-03-16 (×6): 15 mL via OROMUCOSAL

## 2012-03-13 MED ORDER — HYDROCODONE-ACETAMINOPHEN 5-325 MG PO TABS: 1.0000 | ORAL_TABLET | Freq: Four times a day (QID) | ORAL | Status: DC | PRN

## 2012-03-13 MED ORDER — PANTOPRAZOLE SODIUM 40 MG PO TBEC
40.0000 mg | DELAYED_RELEASE_TABLET | Freq: Every day | ORAL | Status: DC
  Administered 2012-03-14 – 2012-03-16 (×3): 40 mg via ORAL
  Filled 2012-03-13 (×3): qty 1

## 2012-03-13 MED ORDER — ATORVASTATIN CALCIUM 40 MG PO TABS
40.0000 mg | ORAL_TABLET | Freq: Every evening | ORAL | Status: DC
  Administered 2012-03-13 – 2012-03-15 (×2): 40 mg via ORAL
  Filled 2012-03-13 (×4): qty 1

## 2012-03-13 MED ORDER — DILTIAZEM HCL ER COATED BEADS 240 MG PO CP24
240.0000 mg | ORAL_CAPSULE | Freq: Every day | ORAL | Status: DC
  Filled 2012-03-13 (×2): qty 1

## 2012-03-13 MED ORDER — POLYETHYLENE GLYCOL 3350 17 G PO PACK
17.0000 g | PACK | Freq: Every day | ORAL | Status: DC | PRN
  Administered 2012-03-15: 17 g via ORAL
  Filled 2012-03-13 (×2): qty 1

## 2012-03-13 MED ORDER — FUROSEMIDE 20 MG PO TABS
20.0000 mg | ORAL_TABLET | Freq: Every day | ORAL | Status: DC
  Administered 2012-03-14 – 2012-03-16 (×3): 20 mg via ORAL
  Filled 2012-03-13 (×3): qty 1

## 2012-03-13 MED ORDER — SACCHAROMYCES BOULARDII 250 MG PO CAPS
250.0000 mg | ORAL_CAPSULE | Freq: Two times a day (BID) | ORAL | Status: DC
  Administered 2012-03-13 – 2012-03-14 (×2): 250 mg via ORAL
  Filled 2012-03-13 (×3): qty 1

## 2012-03-13 MED ORDER — ALLOPURINOL 100 MG PO TABS
100.0000 mg | ORAL_TABLET | Freq: Every day | ORAL | Status: DC
  Administered 2012-03-14 – 2012-03-16 (×3): 100 mg via ORAL
  Filled 2012-03-13 (×3): qty 1

## 2012-03-13 MED ORDER — DOCUSATE SODIUM 100 MG PO CAPS
100.0000 mg | ORAL_CAPSULE | Freq: Two times a day (BID) | ORAL | Status: DC
  Administered 2012-03-13 – 2012-03-16 (×6): 100 mg via ORAL
  Filled 2012-03-13 (×7): qty 1

## 2012-03-13 MED ORDER — AMIODARONE HCL 100 MG PO TABS
100.0000 mg | ORAL_TABLET | Freq: Two times a day (BID) | ORAL | Status: DC
  Administered 2012-03-14 – 2012-03-16 (×4): 100 mg via ORAL
  Filled 2012-03-13 (×7): qty 1

## 2012-03-13 MED ORDER — FERROUS FUMARATE 325 (106 FE) MG PO TABS
1.0000 | ORAL_TABLET | Freq: Two times a day (BID) | ORAL | Status: DC
  Administered 2012-03-13 – 2012-03-16 (×6): 106 mg via ORAL
  Filled 2012-03-13 (×7): qty 1

## 2012-03-13 MED ORDER — MORPHINE SULFATE 4 MG/ML IJ SOLN
4.0000 mg | INTRAMUSCULAR | Status: DC | PRN
  Filled 2012-03-13: qty 1

## 2012-03-13 NOTE — Consult Note (Addendum)
ORTHOPAEDIC CONSULTATION  REQUESTING PHYSICIAN: Vassie Loll, MD  Chief Complaint: Left wrist and hip pain  HPI: [name] [name] is a 79 y.o. female who complains of  left wrist and hip pain following a fall at home. She fell on steps this morning, sustaining pain swelling and deformity at the left wrist. In addition, while in the emergency department she complained of left hip pain and x-rays revealed a femoral neck fracture. She is anticoagulated for atrial fibrillation on Xaralto, and took her medication at 8:00 this morning. She lives in her home, and uses a walker occasionally, more recently due to increasded symptoms of vertigo. She has actually been referred to vestibular PT and is awaiting her first appointment.  Past Medical History  Diagnosis Date  . Hypertension   . Hyperlipidemia   . COPD (chronic obstructive pulmonary disease)   . Osteoporosis   . Arthritis   . H/O hiatal hernia   . Chronic back pain   . CAD (coronary artery disease)     LHC 9/05:  pLAD less than 20%, D1 20-30%, ostial RCA 40-50%, proximal RCA 20%, EF 60%.  . C. difficile diarrhea   . Gout   . Atrial fibrillation     a. failed DCCV => b. amiodarone added  . Carotid stenosis     s/p bilat CEA  . C. difficile colitis     10/2011;  11/2011  . Salmonella enteritis 08/2011    c/b septic shock  . Hx of echocardiogram     a. Echocardiogram 09/29/11: Mild LVH, EF 45-50%, diffuse HK, mild to moderate aortic stenosis, mean gradient 12 mmHg, moderate MAC, mild MR, moderate LAE, moderate RAE, question small secundum ASD with left to right flow not seen in 4 chamber view, PASP 35-39 (mild pulmonary hypertension). ;  b.  TEE on 10/04/11: Mild LVH, EF 55%, mild MR, no defect or PFO  . Pneumonia 11/2101   Past Surgical History  Procedure Date  . Carotid endarterectomy     bilateral  . Cataract extraction, bilateral   . Joint replacement 2008    Right Total Knee  . Anal fistula repair   . Left parotidectomy   . Eye  surgery   . Flexible sigmoidoscopy 09/02/2011    Procedure: FLEXIBLE SIGMOIDOSCOPY;  Surgeon: Theda Belfast, MD;  Location: [facility] ENDOSCOPY;  Service: Endoscopy;  Laterality: N/A;  . Flexible sigmoidoscopy 09/03/2011    Procedure: FLEXIBLE SIGMOIDOSCOPY;  Surgeon: Theda Belfast, MD;  Location: [facility] ENDOSCOPY;  Service: Endoscopy;  Laterality: N/A;  . Tee without cardioversion 10/04/2011    Procedure: TRANSESOPHAGEAL ECHOCARDIOGRAM (TEE);  Surgeon: Laurey Morale, MD;  Location: [facility] ENDOSCOPY;  Service: Cardiovascular;  Laterality: N/A;  to be carelinked here by 1230-verified 7/29/dl  . Cardioversion 10/04/2011    Procedure: CARDIOVERSION;  Surgeon: Laurey Morale, MD;  Location: [facility] ENDOSCOPY;  Service: Cardiovascular;  Laterality: N/A;  . Esophagogastroduodenoscopy 11/29/2011    Procedure: ESOPHAGOGASTRODUODENOSCOPY (EGD);  Surgeon: Florencia Reasons, MD;  Location: [facility] ENDOSCOPY;  Service: Endoscopy;  Laterality: N/A;  recent h/o resp failure/pneumonia   History   Social History  . Marital Status: Widowed    Spouse Name: N/A    Number of Children: N/A  . Years of Education: N/A   Social History Main Topics  . Smoking status: Former Smoker -- 1.0 packs/day for 20 years    Types: Cigarettes    Quit date: 09/01/1983  . Smokeless tobacco: Never Used  . Alcohol Use: No  . Drug  Use: No  . Sexually Active: No   Other Topics Concern  . None   Social History Narrative  . None   Family History  Problem Relation Age of Onset  . Hemochromatosis Sister   . Diabetes type II Sister    Allergies  Allergen Reactions  . Baby Powder (Methylbenzethonium) Shortness Of Breath  . Demerol (Meperidine) Hives  . Penicillins Hives   Prior to Admission medications   Medication Sig Start Date End Date Taking? Authorizing Provider  allopurinol (ZYLOPRIM) 100 MG tablet Take 100 mg by mouth daily.    Yes Historical Provider, MD  amiodarone (PACERONE) 200 MG tablet Take 100 mg by mouth 2 (two) times daily.     Yes Historical Provider, MD  atorvastatin (LIPITOR) 40 MG tablet Take 40 mg by mouth every evening.    Yes Historical Provider, MD  budesonide-formoterol (SYMBICORT) 160-4.5 MCG/ACT inhaler Inhale 2 puffs into the lungs 2 (two) times daily.   Yes Historical Provider, MD  diltiazem (CARDIZEM CD) 240 MG 24 hr capsule Take 1 capsule (240 mg total) by mouth daily. 09/15/11 09/14/12 Yes Adeline C Viyuoh, MD  ferrous fumarate (HEMOCYTE - 106 MG FE) 325 (106 FE) MG TABS Take 1 tablet by mouth 2 (two) times daily.   Yes Historical Provider, MD  fish oil-omega-3 fatty acids 1000 MG capsule Take 1 g by mouth 2 (two) times daily.    Yes Historical Provider, MD  Flaxseed, Linseed, (FLAX SEED OIL PO) Take 1 capsule by mouth 2 (two) times daily.   Yes Historical Provider, MD  furosemide (LASIX) 40 MG tablet Take 20 mg by mouth daily.    Yes Historical Provider, MD  levalbuterol (XOPENEX HFA) 45 MCG/ACT inhaler Inhale 2 puffs into the lungs 2 (two) times daily.   Yes Historical Provider, MD  levalbuterol [facility] HFA) 45 MCG/ACT inhaler Inhale 1-2 puffs into the lungs every 4 (four) hours as needed. Wheezing   Yes Historical Provider, MD  omeprazole (PRILOSEC) 20 MG capsule Take 20 mg by mouth daily.   Yes Historical Provider, MD  raloxifene (EVISTA) 60 MG tablet Take 60 mg by mouth daily.   Yes Historical Provider, MD  Rivaroxaban (XARELTO) 15 MG TABS tablet Take 1 tablet (15 mg total) by mouth daily. 01/27/12  Yes Kathleene Hazel, MD  saccharomyces boulardii (FLORASTOR) 250 MG capsule Take 250 mg by mouth 2 (two) times daily.   Yes Historical Provider, MD   Dg Wrist Complete Left  03/13/2012  *RADIOLOGY REPORT*  Clinical Data: Fall with left wrist pain and swelling.  LEFT WRIST - COMPLETE 3+ VIEW  Comparison: None  Findings: A comminuted intra-articular fracture of the distal radius is noted with mild apex palmar angulation. An ulnar styloid fracture is also present. Marked degenerative changes at the first  carpometacarpal joint are present. Diffuse osteopenia is noted. There is no evidence of subluxation or dislocation.  IMPRESSION: Comminuted intrarticular distal radial fracture and ulnar styloid fracture.   Original Report Authenticated By: Harmon Pier, M.D.    Dg Hip Complete Left  03/13/2012  *RADIOLOGY REPORT*  Clinical Data: Fall, left hip pain.  LEFT HIP - COMPLETE 2+ VIEW  Comparison: None  Findings: There is a left femoral neck fracture.  Mild valgus angulation.  Slight impaction laterally.  No subluxation or dislocation.  SI joints are symmetric and unremarkable.  Right hip unremarkable.  IMPRESSION: Left femoral neck fracture with mild valgus angulation.   Original Report Authenticated By: Charlett Nose, M.D.    Ct  Head Wo Contrast  03/13/2012  *RADIOLOGY REPORT*  Clinical Data:  Fall  CT HEAD WITHOUT CONTRAST CT CERVICAL SPINE WITHOUT CONTRAST  Technique:  Multidetector CT imaging of the head and cervical spine was performed following the standard protocol without intravenous contrast.  Multiplanar CT image reconstructions of the cervical spine were also generated.  Comparison:   None  CT HEAD  Findings: Chronic infarct left parietal lobe.  Mild chronic microvascular ischemia in the white matter.  No acute infarct. Negative for hemorrhage or mass.  Negative for skull fracture. Mild chronic sinusitis.  IMPRESSION: Chronic left parietal infarct.  No acute abnormality.  CT CERVICAL SPINE  Findings: Prior left parotidectomy.  Surgical clips around the right carotid from prior carotid endarterectomy.  Negative for fracture.  2 mm anterior slip C3-4.  Mild disc degeneration and spondylosis at C3-4.  Moderate to advanced spondylosis at C4-5 and C5-6.  Moderate spondylosis at C6-7.  Facet degeneration is present especially on the left side.  No mass lesion is identified.  IMPRESSION: Moderate cervical degenerative change.  Negative for fracture.   Original Report Authenticated By: Janeece Riggers, M.D.    Ct  Cervical Spine Wo Contrast  03/13/2012  *RADIOLOGY REPORT*  Clinical Data:  Fall  CT HEAD WITHOUT CONTRAST CT CERVICAL SPINE WITHOUT CONTRAST  Technique:  Multidetector CT imaging of the head and cervical spine was performed following the standard protocol without intravenous contrast.  Multiplanar CT image reconstructions of the cervical spine were also generated.  Comparison:   None  CT HEAD  Findings: Chronic infarct left parietal lobe.  Mild chronic microvascular ischemia in the white matter.  No acute infarct. Negative for hemorrhage or mass.  Negative for skull fracture. Mild chronic sinusitis.  IMPRESSION: Chronic left parietal infarct.  No acute abnormality.  CT CERVICAL SPINE  Findings: Prior left parotidectomy.  Surgical clips around the right carotid from prior carotid endarterectomy.  Negative for fracture.  2 mm anterior slip C3-4.  Mild disc degeneration and spondylosis at C3-4.  Moderate to advanced spondylosis at C4-5 and C5-6.  Moderate spondylosis at C6-7.  Facet degeneration is present especially on the left side.  No mass lesion is identified.  IMPRESSION: Moderate cervical degenerative change.  Negative for fracture.   Original Report Authenticated By: Janeece Riggers, M.D.    Dg Chest Portable 1 View  03/13/2012  *RADIOLOGY REPORT*  Clinical Data: Preop evaluation.  History of a fall.  PORTABLE CHEST - 1 VIEW  Comparison: 12/08/2011.  Findings: There is borderline cardiac silhouette enlargement accentuated by AP portable supine technique.  There is mild vascular congestion pattern with slight increased interstitial reticular markings.  There is upper lobe vascular prominence but this is a supine image.  No consolidation or pleural effusion is seen.  Mediastinal and hilar contours appear stable.  Surgical clips are seen in the lower right neck soft tissues. Scoliosis convexity to the left.,  IMPRESSION: Borderline cardiac silhouette enlargement.  Mild vascular congestion pattern with slight  increased reticular markings accentuated by supine positioning.  No consolidation or pleural effusion.  No evidence of fracture.   Original Report Authenticated By: Onalee Hua Call     Positive ROS: All other systems have been reviewed and were otherwise negative with the exception of those mentioned in the HPI and as above.  Physical Exam: Vitals: Refer to EMR. Constitutional:  WD, WN, NAD HEENT:  NCAT, EOMI Neuro/Psych:  Alert & oriented to person, place, and time; appropriate mood & affect Lymphatic: No generalized edema  or lymphadenopathy Extremities / MSK:  The extremities are normal with respect to appearance, ranges of motion, joint stability, muscle strength/tone, sensation, & perfusion except as otherwise noted:   Left wrist is swollen, tender at the distal radius, with intact light touch sensibility across the radial, median, and ulnar nerve distributions. Motor is intact as well. No pain with palpation at the elbow. Left lower extremity lies symmetrical to the right, unable to maintain straight leg raise without pain, has knee pain reported with passive IR/ER at the hip. No pain with palpation of the knee.  Assessment: 1.  comminuted angulated and shortened left distal radius fracture 2. left femoral neck fracture with mild valgus and posterior angulation  Plan: These findings were discussed with the patient and her daughter. I instilled a hematoma block with 1% plain lidocaine, 10 mL site, and following this suspended her left upper extremity in finger trap traction, 5 pounds x10 minutes. Sugar tong splint was applied subjective reduction also performed. Postreduction radiographs are pending and postreduction neurovascular exam is unchanged. With regard to left hip, discussed femoral neck fractures verses intertrochanteric femur fractures.  With regard to femoral neck fractures, I discussed how hers was of omental a subtle category with some mild angulation without translational  displacement. I recommended proceeding with percutaneous skeletal fixation of the femoral neck fracture, proceeding only to hemiarthroplasty if this fails to heal or develops AVN. Because of her anticoagulation, will await until tomorrow afternoon to proceed. By that time, the wrist radiographs will have been performed we can determine whether to proceed with operative treatment of the left distal radius as well.  Post-reduction xrays of left distal radius reveal continued significant shortening, flattening, and dorsal tilt.  I have recommended ORIF of this in same setting as Left hip tomorrow.  Consent obtained.  Goals/Risks/Options reviewed.

## 2012-03-13 NOTE — ED Provider Notes (Addendum)
History     CSN: [id number]  Arrival date & time 03/13/12  1254   First MD Initiated Contact with Patient 03/13/12 1302      Chief Complaint  Patient presents with  . Fall    (Consider location/radiation/quality/duration/timing/severity/associated sxs/prior treatment) HPI Comments: 79 year old female with a history of vertigo and A. fib presents to the emergency department via EMS with her daughter complaining of left wrist pain after falling earlier today. According to daughter patient got dizzy and fell. She did hit her head but did not lose consciousness Currently rates wrist pain 5/10, worse with movement or palpation. Denies headache, neck pain or back pain. Daughter states she has been getting dizzy frequently, her primary care physician worked her up for vertigo. She is supposed to begin physical therapy for vertigo at a vestibular specialist next week. Denies chest pain, sob, confusion, visual disturbance. She is currently on xarelto.   Patient is a 79 y.o. female presenting with fall. The history is provided by the patient and a relative.  Fall Pertinent negatives include no fever, no abdominal pain, no nausea, no vomiting, no hematuria and no headaches.    Past Medical History  Diagnosis Date  . Hypertension   . Hyperlipidemia   . COPD (chronic obstructive pulmonary disease)   . Osteoporosis   . Arthritis   . H/O hiatal hernia   . Chronic back pain   . CAD (coronary artery disease)     LHC 9/05:  pLAD less than 20%, D1 20-30%, ostial RCA 40-50%, proximal RCA 20%, EF 60%.  . C. difficile diarrhea   . Gout   . Atrial fibrillation     a. failed DCCV => b. amiodarone added  . Carotid stenosis     s/p bilat CEA  . C. difficile colitis     10/2011;  11/2011  . Salmonella enteritis 08/2011    c/b septic shock  . Hx of echocardiogram     a. Echocardiogram 09/29/11: Mild LVH, EF 45-50%, diffuse HK, mild to moderate aortic stenosis, mean gradient 12 mmHg, moderate MAC, mild MR,  moderate LAE, moderate RAE, question small secundum ASD with left to right flow not seen in 4 chamber view, PASP 35-39 (mild pulmonary hypertension). ;  b.  TEE on 10/04/11: Mild LVH, EF 55%, mild MR, no defect or PFO  . Pneumonia 11/2101    Past Surgical History  Procedure Date  . Carotid endarterectomy     bilateral  . Cataract extraction, bilateral   . Joint replacement 2008    Right Total Knee  . Anal fistula repair   . Left parotidectomy   . Eye surgery   . Flexible sigmoidoscopy 09/02/2011    Procedure: FLEXIBLE SIGMOIDOSCOPY;  Surgeon: Theda Belfast, MD;  Location: [facility] ENDOSCOPY;  Service: Endoscopy;  Laterality: N/A;  . Flexible sigmoidoscopy 09/03/2011    Procedure: FLEXIBLE SIGMOIDOSCOPY;  Surgeon: Theda Belfast, MD;  Location: [facility] ENDOSCOPY;  Service: Endoscopy;  Laterality: N/A;  . Tee without cardioversion 10/04/2011    Procedure: TRANSESOPHAGEAL ECHOCARDIOGRAM (TEE);  Surgeon: Laurey Morale, MD;  Location: [facility] ENDOSCOPY;  Service: Cardiovascular;  Laterality: N/A;  to be carelinked here by 1230-verified 7/29/dl  . Cardioversion 10/04/2011    Procedure: CARDIOVERSION;  Surgeon: Laurey Morale, MD;  Location: [facility] ENDOSCOPY;  Service: Cardiovascular;  Laterality: N/A;  . Esophagogastroduodenoscopy 11/29/2011    Procedure: ESOPHAGOGASTRODUODENOSCOPY (EGD);  Surgeon: Florencia Reasons, MD;  Location: [facility] ENDOSCOPY;  Service: Endoscopy;  Laterality: N/A;  recent h/o  resp failure/pneumonia    Family History  Problem Relation Age of Onset  . Hemochromatosis Sister   . Diabetes type II Sister     History  Substance Use Topics  . Smoking status: Former Smoker -- 1.0 packs/day for 20 years    Types: Cigarettes    Quit date: 09/01/1983  . Smokeless tobacco: Never Used  . Alcohol Use: No    OB History    Grav Para Term Preterm Abortions TAB SAB Ect Mult Living                  Review of Systems  Constitutional: Negative for fever, chills and fatigue.  HENT: Negative for neck  pain and neck stiffness.   Eyes: Negative for visual disturbance.  Respiratory: Negative for shortness of breath.   Cardiovascular: Negative for chest pain.  Gastrointestinal: Negative for nausea, vomiting and abdominal pain.  Genitourinary: Negative for hematuria and pelvic pain.  Musculoskeletal: Negative for back pain.       Positive left wrist pain.  Skin: Positive for wound.  Neurological: Positive for dizziness. Negative for syncope, weakness, light-headedness and headaches.  Hematological: Bruises/bleeds easily.  Psychiatric/Behavioral: Negative for confusion.    Allergies  Baby powder; Demerol; and Penicillins  Home Medications   Current Outpatient Rx  Name  Route  Sig  Dispense  Refill  . ALLOPURINOL 100 MG PO TABS   Oral   Take 100 mg by mouth daily.          . AMIODARONE HCL 200 MG PO TABS   Oral   Take 100 mg by mouth 2 (two) times daily.          . ATORVASTATIN CALCIUM 40 MG PO TABS   Oral   Take 40 mg by mouth every evening.          . BUDESONIDE-FORMOTEROL FUMARATE 160-4.5 MCG/ACT IN AERO   Inhalation   Inhale 2 puffs into the lungs 2 (two) times daily.         Marland Kitchen DILTIAZEM HCL ER COATED BEADS 240 MG PO CP24   Oral   Take 1 capsule (240 mg total) by mouth daily.   30 capsule   0   . FERROUS FUMARATE 325 (106 FE) MG PO TABS   Oral   Take 1 tablet by mouth 2 (two) times daily.         . OMEGA-3 FATTY ACIDS 1000 MG PO CAPS   Oral   Take 1 g by mouth 2 (two) times daily.          Marland Kitchen FLAX SEED OIL PO   Oral   Take 1 capsule by mouth 2 (two) times daily.         . FUROSEMIDE 40 MG PO TABS   Oral   Take 20 mg by mouth daily.          Marland Kitchen LEVALBUTEROL TARTRATE 45 MCG/ACT IN AERO   Inhalation   Inhale 2 puffs into the lungs 2 (two) times daily.         Marland Kitchen LEVALBUTEROL TARTRATE 45 MCG/ACT IN AERO   Inhalation   Inhale 1-2 puffs into the lungs every 4 (four) hours as needed. Wheezing         . OMEPRAZOLE 20 MG PO CPDR   Oral    Take 20 mg by mouth daily.         Marland Kitchen RALOXIFENE HCL 60 MG PO TABS   Oral   Take 60 mg by mouth  daily.         Marland Kitchen RIVAROXABAN 15 MG PO TABS   Oral   Take 1 tablet (15 mg total) by mouth daily.   30 tablet   6   . SACCHAROMYCES BOULARDII 250 MG PO CAPS   Oral   Take 250 mg by mouth 2 (two) times daily.           SpO2 94%  Physical Exam  Nursing note and vitals reviewed. Constitutional: She is oriented to person, place, and time. She appears well-developed and well-nourished. No distress. Cervical collar and backboard in place.  HENT:  Head: Normocephalic. Head is with laceration (1 cm superficial laceration to left side of forehead).  Mouth/Throat: Uvula is midline, oropharynx is clear and moist and mucous membranes are normal.  Eyes: Conjunctivae normal and EOM are normal. Pupils are equal, round, and reactive to light.  Neck: No spinous process tenderness and no muscular tenderness present.  Cardiovascular: Normal rate, regular rhythm, normal heart sounds and intact distal pulses.   Pulmonary/Chest: Effort normal. No respiratory distress. She has wheezes (mild expiratory wheezes lower lung fields bilateral).  Abdominal: Soft. Bowel sounds are normal. She exhibits no distension. There is no tenderness.  Musculoskeletal:       Left wrist: She exhibits decreased range of motion, tenderness, bony tenderness, swelling and deformity.       Left hand: She exhibits normal capillary refill. normal sensation noted.  Neurological: She is alert and oriented to person, place, and time. No cranial nerve deficit.  Skin: Skin is warm and dry.  Psychiatric: She has a normal mood and affect. Her speech is normal and behavior is normal. Thought content normal.    ED Course  Procedures (including critical care time)   Labs Reviewed  CBC  BASIC METABOLIC PANEL  PROTIME-INR   No results found.   1. Hip fracture, left   2. Closed left radial fracture   3. Coagulopathy   4. Acute  on chronic renal failure   5. Atrial fibrillation   6. CAD (coronary artery disease) of artery bypass graft   7. Closed left hip fracture       MDM  79 y/o female with left wrist injury s/p fall. Patient did hit her head and is on blood thinners. No LOC. Patient is in NAD and AAOx3. Obtaining x-ray left wrist, CT head and neck, CBC, bmet, PT/INR, EKG. After obtaining wrist xray patient was complaining of left hip pain. Left hip xray ordered. Case discussed with Dr. Effie Shy who agrees with plan of care. 3:06 PM Case discussed with Elpidio Anis, PA-C who will assume care of patient at this time.       Trevor Mace, PA-C 03/13/12 1507  Trevor Mace, PA-C 03/13/12 2132

## 2012-03-13 NOTE — H&P (Signed)
[facility] History and Physical  MARLISS BUTTACAVOLI ZOX:[id number] DOB: February 28, 1924 DOA: 03/13/2012  Referring physician: Dr. Effie Shy PCP: Carilyn Goodpasture, PA  Specialists:   Chief Complaint: Fall, wrist pain and hip pain  HPI: [name] [name] is a 79 y.o. female 78 year old female with a history of vertigo, hypertension, COPD, hyperlipidemia, GERD, coronary artery disease and A. Fib; presents to the emergency department via EMS with her daughter complaining of left wrist pain and left hip pain after falling earlier today. According to daughter patient got dizzy and fell. She did hit her head but never lose  Consciousness. Denies headache, neck pain or back pain.  Daughter states she has been getting dizzy frequently, her primary care physician worked her up for vertigo. She is supposed to begin physical therapy for vertigo at a vestibular rehabilitation next week. Patient denies any associated symptoms or prodromic abnormalities prior to her fall including chest pain, sob, confusion, visual disturbance, nausea, vomiting, fever, chills, melena or any other acute complaints. Of note, patient is chronically on xarelto for her atrial fibrillation.   Review of Systems:  Negative except as mentioned on history of present illness.  Past Medical History  Diagnosis Date  . Hypertension   . Hyperlipidemia   . COPD (chronic obstructive pulmonary disease)   . Osteoporosis   . Arthritis   . H/O hiatal hernia   . Chronic back pain   . CAD (coronary artery disease)     LHC 9/05:  pLAD less than 20%, D1 20-30%, ostial RCA 40-50%, proximal RCA 20%, EF 60%.  . C. difficile diarrhea   . Gout   . Atrial fibrillation     a. failed DCCV => b. amiodarone added  . Carotid stenosis     s/p bilat CEA  . C. difficile colitis     10/2011;  11/2011  . Salmonella enteritis 08/2011    c/b septic shock  . Hx of echocardiogram     a. Echocardiogram 09/29/11: Mild LVH, EF 45-50%, diffuse HK, mild to moderate aortic  stenosis, mean gradient 12 mmHg, moderate MAC, mild MR, moderate LAE, moderate RAE, question small secundum ASD with left to right flow not seen in 4 chamber view, PASP 35-39 (mild pulmonary hypertension). ;  b.  TEE on 10/04/11: Mild LVH, EF 55%, mild MR, no defect or PFO  . Pneumonia 11/2101   Past Surgical History  Procedure Date  . Carotid endarterectomy     bilateral  . Cataract extraction, bilateral   . Joint replacement 2008    Right Total Knee  . Anal fistula repair   . Left parotidectomy   . Eye surgery   . Flexible sigmoidoscopy 09/02/2011    Procedure: FLEXIBLE SIGMOIDOSCOPY;  Surgeon: Theda Belfast, MD;  Location: [facility] ENDOSCOPY;  Service: Endoscopy;  Laterality: N/A;  . Flexible sigmoidoscopy 09/03/2011    Procedure: FLEXIBLE SIGMOIDOSCOPY;  Surgeon: Theda Belfast, MD;  Location: [facility] ENDOSCOPY;  Service: Endoscopy;  Laterality: N/A;  . Tee without cardioversion 10/04/2011    Procedure: TRANSESOPHAGEAL ECHOCARDIOGRAM (TEE);  Surgeon: Laurey Morale, MD;  Location: [facility] ENDOSCOPY;  Service: Cardiovascular;  Laterality: N/A;  to be carelinked here by 1230-verified 7/29/dl  . Cardioversion 10/04/2011    Procedure: CARDIOVERSION;  Surgeon: Laurey Morale, MD;  Location: [facility] ENDOSCOPY;  Service: Cardiovascular;  Laterality: N/A;  . Esophagogastroduodenoscopy 11/29/2011    Procedure: ESOPHAGOGASTRODUODENOSCOPY (EGD);  Surgeon: Florencia Reasons, MD;  Location: [facility] ENDOSCOPY;  Service: Endoscopy;  Laterality: N/A;  recent h/o resp failure/pneumonia  Social History:  reports that she quit smoking about 28 years ago. Her smoking use included Cigarettes. She has a 20 pack-year smoking history. She has never used smokeless tobacco. She reports that she does not drink alcohol or use illicit drugs. patient came from home according to her daughter and patient himself has been requiring lately mild assistance with activities of daily living secondary to intermittent episode of dizziness and  lightheadedness.   Allergies  Allergen Reactions  . Baby Powder (Methylbenzethonium) Shortness Of Breath  . Demerol (Meperidine) Hives  . Penicillins Hives    Family History  Problem Relation Age of Onset  . Hemochromatosis Sister   . Diabetes type II Sister    Prior to Admission medications   Medication Sig Start Date End Date Taking? Authorizing Provider  allopurinol (ZYLOPRIM) 100 MG tablet Take 100 mg by mouth daily.    Yes Historical Provider, MD  amiodarone (PACERONE) 200 MG tablet Take 100 mg by mouth 2 (two) times daily.    Yes Historical Provider, MD  atorvastatin (LIPITOR) 40 MG tablet Take 40 mg by mouth every evening.    Yes Historical Provider, MD  budesonide-formoterol (SYMBICORT) 160-4.5 MCG/ACT inhaler Inhale 2 puffs into the lungs 2 (two) times daily.   Yes Historical Provider, MD  diltiazem (CARDIZEM CD) 240 MG 24 hr capsule Take 1 capsule (240 mg total) by mouth daily. 09/15/11 09/14/12 Yes Adeline C Viyuoh, MD  ferrous fumarate (HEMOCYTE - 106 MG FE) 325 (106 FE) MG TABS Take 1 tablet by mouth 2 (two) times daily.   Yes Historical Provider, MD  fish oil-omega-3 fatty acids 1000 MG capsule Take 1 g by mouth 2 (two) times daily.    Yes Historical Provider, MD  Flaxseed, Linseed, (FLAX SEED OIL PO) Take 1 capsule by mouth 2 (two) times daily.   Yes Historical Provider, MD  furosemide (LASIX) 40 MG tablet Take 20 mg by mouth daily.    Yes Historical Provider, MD  levalbuterol (XOPENEX HFA) 45 MCG/ACT inhaler Inhale 2 puffs into the lungs 2 (two) times daily.   Yes Historical Provider, MD  levalbuterol [facility] HFA) 45 MCG/ACT inhaler Inhale 1-2 puffs into the lungs every 4 (four) hours as needed. Wheezing   Yes Historical Provider, MD  omeprazole (PRILOSEC) 20 MG capsule Take 20 mg by mouth daily.   Yes Historical Provider, MD  raloxifene (EVISTA) 60 MG tablet Take 60 mg by mouth daily.   Yes Historical Provider, MD  Rivaroxaban (XARELTO) 15 MG TABS tablet Take 1 tablet  (15 mg total) by mouth daily. 01/27/12  Yes Kathleene Hazel, MD  saccharomyces boulardii (FLORASTOR) 250 MG capsule Take 250 mg by mouth 2 (two) times daily.   Yes Historical Provider, MD   Physical Exam: Filed Vitals:   03/13/12 1259 03/13/12 1350 03/13/12 1659  BP:  171/54 163/45  Pulse:  65 63  Temp:   98.3 F (36.8 C)  TempSrc:   Oral  Resp:  16   SpO2: 94% 98% 100%   General: Alert, awake and oriented x3, no acute distress; currently no complaining of significant pain after pain medications given in the ED. Afebrile and cooperative with examination. Daughter at bedside helping with history. HENT:  Head: Normocephalic. Head is with laceration (1 cm superficial laceration to left side of forehead).  Mouth/Throat: Uvula is midline, oropharynx is clear and moist and mucous membranes are normal.  Eyes: Conjunctivae normal and EOM are normal. Pupils are equal, round, and reactive to light.  Neck:  No spinous process tenderness and no muscular tenderness present.  Cardiovascular: Normal rate, regular rhythm, normal heart sounds and intact distal pulses.  Pulmonary/Chest: Effort normal. No respiratory distress. No wheezes, no crackles. No accessory muscle use.  Abdominal: Soft. Bowel sounds are normal. She exhibits no distension. There is no tenderness.  Musculoskeletal: Left wrist swelling, decreased range of motion and tenderness to pull patient of her bony structures. There is also limited left lower extremity movement and external rotation. No other abnormalities of her joints or lims appreciated.  Neurological: She is alert and oriented to person, place, and time. No cranial nerve deficit.  Skin: Skin is warm and dry.  Psychiatric: She has a normal mood and affect. Her speech is normal and behavior is normal. Thought content normal.    Labs on Admission:  Basic Metabolic Panel:  Lab 03/13/12 9528  NA 135  K 4.7  CL 101  CO2 27  GLUCOSE 152*  BUN 21  CREATININE 1.68*    CALCIUM 9.3  MG --  PHOS --   CBC:  Lab 03/13/12 1335  WBC 5.6  NEUTROABS --  HGB 11.3*  HCT 34.1*  MCV 88.3  PLT 218    BNP (last 3 results)  Basename 11/28/11 1353 10/01/11 0718 09/29/11 0008  PROBNP 5095.0* 1563.0* 2283.0*    Radiological Exams on Admission: Dg Wrist 2 Views Left  03/13/2012  *RADIOLOGY REPORT*  Clinical Data: Closed reduction, wrist pain with fracture, fell.  LEFT WRIST - 2 VIEW  Comparison: Priors earlier today.  Findings: Comminuted intra-articular distal radius fracture and ulnar styloid avulsion are redemonstrated.  It is difficult to assess for interval improvement because no true lateral film was obtained initially. Slight dorsal angulation noted.  IMPRESSION: Closed reduction as described.   Original Report Authenticated By: Davonna Belling, M.D.    Dg Wrist Complete Left  03/13/2012  *RADIOLOGY REPORT*  Clinical Data: Fall with left wrist pain and swelling.  LEFT WRIST - COMPLETE 3+ VIEW  Comparison: None  Findings: A comminuted intra-articular fracture of the distal radius is noted with mild apex palmar angulation. An ulnar styloid fracture is also present. Marked degenerative changes at the first carpometacarpal joint are present. Diffuse osteopenia is noted. There is no evidence of subluxation or dislocation.  IMPRESSION: Comminuted intrarticular distal radial fracture and ulnar styloid fracture.   Original Report Authenticated By: Harmon Pier, M.D.    Dg Hip Complete Left  03/13/2012  *RADIOLOGY REPORT*  Clinical Data: Fall, left hip pain.  LEFT HIP - COMPLETE 2+ VIEW  Comparison: None  Findings: There is a left femoral neck fracture.  Mild valgus angulation.  Slight impaction laterally.  No subluxation or dislocation.  SI joints are symmetric and unremarkable.  Right hip unremarkable.  IMPRESSION: Left femoral neck fracture with mild valgus angulation.   Original Report Authenticated By: Charlett Nose, M.D.    Ct Head Wo Contrast  03/13/2012  *RADIOLOGY  REPORT*  Clinical Data:  Fall  CT HEAD WITHOUT CONTRAST CT CERVICAL SPINE WITHOUT CONTRAST  Technique:  Multidetector CT imaging of the head and cervical spine was performed following the standard protocol without intravenous contrast.  Multiplanar CT image reconstructions of the cervical spine were also generated.  Comparison:   None  CT HEAD  Findings: Chronic infarct left parietal lobe.  Mild chronic microvascular ischemia in the white matter.  No acute infarct. Negative for hemorrhage or mass.  Negative for skull fracture. Mild chronic sinusitis.  IMPRESSION: Chronic left parietal infarct.  No  acute abnormality.  CT CERVICAL SPINE  Findings: Prior left parotidectomy.  Surgical clips around the right carotid from prior carotid endarterectomy.  Negative for fracture.  2 mm anterior slip C3-4.  Mild disc degeneration and spondylosis at C3-4.  Moderate to advanced spondylosis at C4-5 and C5-6.  Moderate spondylosis at C6-7.  Facet degeneration is present especially on the left side.  No mass lesion is identified.  IMPRESSION: Moderate cervical degenerative change.  Negative for fracture.   Original Report Authenticated By: Janeece Riggers, M.D.    Ct Cervical Spine Wo Contrast  03/13/2012  *RADIOLOGY REPORT*  Clinical Data:  Fall  CT HEAD WITHOUT CONTRAST CT CERVICAL SPINE WITHOUT CONTRAST  Technique:  Multidetector CT imaging of the head and cervical spine was performed following the standard protocol without intravenous contrast.  Multiplanar CT image reconstructions of the cervical spine were also generated.  Comparison:   None  CT HEAD  Findings: Chronic infarct left parietal lobe.  Mild chronic microvascular ischemia in the white matter.  No acute infarct. Negative for hemorrhage or mass.  Negative for skull fracture. Mild chronic sinusitis.  IMPRESSION: Chronic left parietal infarct.  No acute abnormality.  CT CERVICAL SPINE  Findings: Prior left parotidectomy.  Surgical clips around the right carotid from prior  carotid endarterectomy.  Negative for fracture.  2 mm anterior slip C3-4.  Mild disc degeneration and spondylosis at C3-4.  Moderate to advanced spondylosis at C4-5 and C5-6.  Moderate spondylosis at C6-7.  Facet degeneration is present especially on the left side.  No mass lesion is identified.  IMPRESSION: Moderate cervical degenerative change.  Negative for fracture.   Original Report Authenticated By: Janeece Riggers, M.D.    Dg Chest Portable 1 View  03/13/2012  *RADIOLOGY REPORT*  Clinical Data: Preop evaluation.  History of a fall.  PORTABLE CHEST - 1 VIEW  Comparison: 12/08/2011.  Findings: There is borderline cardiac silhouette enlargement accentuated by AP portable supine technique.  There is mild vascular congestion pattern with slight increased interstitial reticular markings.  There is upper lobe vascular prominence but this is a supine image.  No consolidation or pleural effusion is seen.  Mediastinal and hilar contours appear stable.  Surgical clips are seen in the lower right neck soft tissues. Scoliosis convexity to the left.,  IMPRESSION: Borderline cardiac silhouette enlargement.  Mild vascular congestion pattern with slight increased reticular markings accentuated by supine positioning.  No consolidation or pleural effusion.  No evidence of fracture.   Original Report Authenticated By: Onalee Hua Call     EKG: No acute ischemic changes appreciated. Patient on A.fib.  Assessment/Plan 1-Dizziness and fall: Patient recently evaluated as an outpatient with findings consistently with a Vestibular dysfunction. Plan is for outpatient vestibular rehabilitation. In order to be thorough with the evaluation will check vitamin D levels, TSH, B12 level. After surgery will also ask PT and OT to evaluate and provide treatment recommendations on the patient. Patient will be admitted to telemetry.  2-Closed left hip fracture: Orthopedic service (Dr. Janee Morn) will be following the patient along and cooperative  the plans are for hip surgery on 03/14/12 after midday. Will follow recommendations regarding weight bearing and after surgery will resume patient's Xarelto for anticoagulation. Patient's surgical risk is moderate but nothing can be done to change her risk at this particular moment. EKG without ischemic changes and first set of troponin negative. No undermining this on x-ray. Okay to proceed with surgical intervention as planned.  3-Hyperlipidemia: Continue statins.  4-Atrial fibrillation: Will  continue Cardizem and and also amiodarone. Once patient is done with surgery plan is to resume her xarelto. Rate is well control at this point patient will be admitted to telemetry.  5-CHF, chronic: Diastolic and systolic heart failure with ejection fraction 45-50% and diffuse hypokinesis on last 2-D echo July 2013. Patient no complaining of any shortness of breath and on exam there is no signs of fluid overload. Will follow close intake and output and also daily weights. Continue current medication regimen.  6-CAD (coronary artery disease) of artery bypass graft: No chest pain. No acute ischemic changes on her EKG. Continue current medication regimen.  7-Acute on chronic renal failure: Most likely secondary to mild dehydration. Will provide gentle hydration and follow renal function. Myoglobinuria secondary to acute fractures might be also playing a role in the patient acute renal failure. At baseline patient creatinine is 1.2-1.4 with a chronic kidney disease stage 2-3.  8-Anemia: Hemoglobin 11.3 on admission. Patient with history of anemia of chronic disease. We'll continue iron supplementation and follow hemoglobin trend. 22 history of coronary artery disease plan is to transfuse the patient hemoglobin less than 8.  9-Left wrist fracture: Close reduction done by Dr. Janee Morn in the emergency department. Further recommendations per orthopedic surgery.  10-Gout: Without any acute flare. Will continue  allopurinol.  DVT: SCDs on stool surgery on 03/14/2012; after that resume Xarelto.  Code Status: Full Family Communication: daughter and grandson at bedside Disposition Plan: admit to inpatient; telemetry bed. Plan is for hip surgery tomorrow afternoon.  Time spent: >30 minutes  Aubry Tucholski [facility] Pager [phone]  If 7PM-7AM, please contact night-coverage [email or web address] Password [facility] 03/13/2012, 6:45 PM

## 2012-03-13 NOTE — ED Provider Notes (Signed)
Patient care assumed from [place], [place]. Patient with a fall secondary to recurrent dizziness injuring head, left wrist and left lower extremity. Assumed care pending x-rays that have resulted showing distal radial fracture requiring reduction, and left hip fracture.  She has seen Dr. Althea Charon in the past - who does not perform surgery. Dr. Janee Morn paged. Patient comfortable. Dr. Effie Shy in to co-evaluate.  Discussed with Dr. Janee Morn (Ortho) who will see in ED. Admitted to [facility], telemetry.  Arnoldo Hooker, PA-C 03/13/12 1654

## 2012-03-13 NOTE — Progress Notes (Signed)
Offered support. Presence; support. Will continue to follow as needed.

## 2012-03-13 NOTE — ED Provider Notes (Signed)
Medical screening examination/treatment/procedure(s) were conducted as a shared visit with non-physician practitioner(s) and myself.  I personally evaluated the patient during the encounter  [name] Monsivais L Timur Nibert, MD 03/13/12 1811 

## 2012-03-13 NOTE — Progress Notes (Signed)
CSW attempted to assess patient, however several providers are in patient room. CSW will assess at more appropriate time. CSW discussed patient with NCM. Per NCM, attending physician is waiting for patient to participate in physical/occupational therapy evaluation to help determine appropriate level of care for patient. CSW will inform unit csw of this information.   Catha Gosselin, LCSWA  [phone] .03/13/2012 1831pm

## 2012-03-13 NOTE — ED Notes (Signed)
Othro Md at bedside for lt wrist cast

## 2012-03-13 NOTE — Progress Notes (Signed)
Clinical Social Work Department BRIEF PSYCHOSOCIAL ASSESSMENT 03/13/2012  Patient:  [name] [name], [name] [name]     Account Number:  [id number]     Admit date:  03/13/2012  Clinical Social Worker:  Doree Albee  Date/Time:  03/13/2012 07:15 PM  Referred by:  Physician  Date Referred:  03/13/2012 Referred for  SNF Placement   Other Referral:   Interview type:  Patient Other interview type:   patient daughter    PSYCHOSOCIAL DATA Living Status:  ALONE Admitted from facility:   Level of care:  Independent Living Primary support name:  Parthenia Ames Primary support relationship to patient:  CHILD, ADULT Degree of support available:   strong    CURRENT CONCERNS Current Concerns  Post-Acute Placement   Other Concerns:    SOCIAL WORK ASSESSMENT / PLAN CSW met with pt, pt daughter, and pt grandson at bedside to complete psychosocial assessment. CSW introduced self and csw role. Pt shared that she is a resident at [facility]. Pt gave CSW permission to discuss pt disposition needs with pt daughter. Pt requested csw to include pt dtr in pt discharge plans.    CSW, Pt, and Pt dtr discussed the anticipated surgery for patient and  the physical therapy evaluation to help determine pt approrpriate level of care when medically stable. CSW, Pt, and Pt dtr discussed options of home health, skilled nursing for short term rehab, and inpatient rehab. Pt and pt daughter verbalized understanding of physical therapy evaluation and MD recommendations to help determine pt disposition needs. CSW provided patient and patient dtr with skilled nursing facility list for future reference.   Assessment/plan status:  Psychosocial Support/Ongoing Assessment of Needs Other assessment/ plan:   Information/referral to community resources:   skilled nursing facility list    PATIENT'S/FAMILY'S RESPONSE TO PLAN OF CARE: Pt and pt dtr thanked csw for concern and support. Pt is motivated to  work with phsycial therapy to help determine pt approrpriate level of care when pt is medically stable. Pt is open to short term rehab at snf, inpatient rehab, and home health depending on recommendations.    Catha Gosselin, LCSWA  [phone] 03/13/2012 1915pm

## 2012-03-13 NOTE — ED Notes (Addendum)
Pt here via ems s/p fall while walking down stairs c/o lt wrist pain denies loc small abraision on foreheadpt is on blood thinner

## 2012-03-13 NOTE — ED Provider Notes (Signed)
[name] [name] is a 79 y.o. female who fell secondary to her chronic dizziness. She injured her left for her left arm and left hip. She did not lose consciousness.   Exam alert, cooperative, and she states she has no pain while at rest  Head abrasion, left forehead. No deformity. Left wrist, tender, and swollen, she resists movement secondary to pain. Left hip has pain with palpation and active range of motion. Her left grossly nonfocal.   Assessment: Fall, secondary to dizziness. This is not a new problem. She'll need to be admission for stabilization of her orthopedic injuries.   Medical screening examination/treatment/procedure(s) were conducted as a shared visit with non-physician practitioner(s) and myself.  I personally evaluated the patient during the encounter  Flint Melter, MD 03/13/12 1810

## 2012-03-13 NOTE — ED Provider Notes (Signed)
Medical screening examination/treatment/procedure(s) were conducted as a shared visit with non-physician practitioner(s) and myself.  I personally evaluated the patient during the encounter  Flint Melter, MD 03/13/12 [phone]

## 2012-03-13 NOTE — ED Notes (Signed)
WUJ:WJ19<JY> Expected date:03/13/12<BR> Expected time:12:40 PM<BR> Means of arrival:Ambulance<BR> Comments:<BR> fall

## 2012-03-14 ENCOUNTER — Encounter (HOSPITAL_COMMUNITY): Payer: Self-pay | Admitting: Anesthesiology

## 2012-03-14 ENCOUNTER — Inpatient Hospital Stay (HOSPITAL_COMMUNITY): Payer: Medicare Other

## 2012-03-14 ENCOUNTER — Inpatient Hospital Stay (HOSPITAL_COMMUNITY): Payer: Medicare Other | Admitting: Anesthesiology

## 2012-03-14 ENCOUNTER — Encounter (HOSPITAL_COMMUNITY)
Admission: EM | Disposition: A | Discharge status: Skilled Nursing Facility | Payer: Self-pay | Source: Home / Self Care | Attending: Internal Medicine

## 2012-03-14 DIAGNOSIS — I2510 Atherosclerotic heart disease of native coronary artery without angina pectoris: Secondary | ICD-10-CM

## 2012-03-14 DIAGNOSIS — I5032 Chronic diastolic (congestive) heart failure: Secondary | ICD-10-CM

## 2012-03-14 DIAGNOSIS — I4891 Unspecified atrial fibrillation: Secondary | ICD-10-CM

## 2012-03-14 HISTORY — PX: OPEN REDUCTION INTERNAL FIXATION (ORIF) SCAPHOID WITH DISTAL RADIUS GRAFT: SHX5667

## 2012-03-14 HISTORY — PX: HIP PINNING,CANNULATED: SHX1758

## 2012-03-14 HISTORY — PX: OTHER SURGICAL HISTORY: SHX169

## 2012-03-14 LAB — CBC
HCT: 29.7 % — ABNORMAL LOW (ref 36.0–46.0)
MCH: 28.9 pg (ref 26.0–34.0)
MCV: 90.3 fL (ref 78.0–100.0)
RBC: 3.29 MIL/uL — ABNORMAL LOW (ref 3.87–5.11)
WBC: 6.3 10*3/uL (ref 4.0–10.5)

## 2012-03-14 LAB — LIPID PANEL
HDL: 76 mg/dL (ref >39)
LDL Cholesterol: 81 mg/dL (ref 0–99)
Total CHOL/HDL Ratio: 2.3 RATIO
VLDL: 19 mg/dL (ref 0–40)

## 2012-03-14 LAB — BASIC METABOLIC PANEL
BUN: 22 mg/dL (ref 6–23)
CO2: 27 mEq/L (ref 19–32)
Calcium: 8.6 mg/dL (ref 8.4–10.5)
Chloride: 103 mEq/L (ref 96–112)
Creatinine, Ser: 1.72 mg/dL — ABNORMAL HIGH (ref 0.50–1.10)

## 2012-03-14 LAB — TSH: TSH: 1.339 u[IU]/mL (ref 0.350–4.500)

## 2012-03-14 LAB — SURGICAL PCR SCREEN: MRSA, PCR: INVALID — AB

## 2012-03-14 LAB — TROPONIN I: Troponin I: <0.3 ng/mL (ref <0.30)

## 2012-03-14 LAB — PROTIME-INR: Prothrombin Time: 15 seconds (ref 11.6–15.2)

## 2012-03-14 SURGERY — OPEN REDUCTION INTERNAL FIXATION (ORIF) SCAPHOID WITH DISTAL RADIUS GRAFT
Anesthesia: General | Site: Wrist | Laterality: Left | Wound class: Clean

## 2012-03-14 MED ORDER — ONDANSETRON HCL 4 MG/2ML IJ SOLN
INTRAMUSCULAR | Status: AC
  Filled 2012-03-14: qty 2

## 2012-03-14 MED ORDER — CLINDAMYCIN PHOSPHATE 600 MG/50ML IV SOLN
600.0000 mg | Freq: Four times a day (QID) | INTRAVENOUS | Status: AC
  Administered 2012-03-14 – 2012-03-15 (×2): 600 mg via INTRAVENOUS
  Filled 2012-03-14 (×2): qty 50

## 2012-03-14 MED ORDER — PHENOL 1.4 % MT LIQD: 1.0000 | OROMUCOSAL | Status: DC | PRN

## 2012-03-14 MED ORDER — 0.9 % SODIUM CHLORIDE (POUR BTL) OPTIME
TOPICAL | Status: DC | PRN
  Administered 2012-03-14: 1000 mL

## 2012-03-14 MED ORDER — METOCLOPRAMIDE HCL 10 MG PO TABS: 5.0000 mg | ORAL_TABLET | Freq: Three times a day (TID) | ORAL | Status: DC | PRN

## 2012-03-14 MED ORDER — PROMETHAZINE HCL 25 MG/ML IJ SOLN: 12.5000 mg | INTRAMUSCULAR | Status: DC | PRN

## 2012-03-14 MED ORDER — CISATRACURIUM BESYLATE (PF) 10 MG/5ML IV SOLN
INTRAVENOUS | Status: DC | PRN
  Administered 2012-03-14: 2 mg via INTRAVENOUS
  Administered 2012-03-14: 4 mg via INTRAVENOUS

## 2012-03-14 MED ORDER — EPHEDRINE SULFATE 50 MG/ML IJ SOLN
INTRAMUSCULAR | Status: DC | PRN
  Administered 2012-03-14: 10 mg via INTRAVENOUS

## 2012-03-14 MED ORDER — ACETAMINOPHEN 325 MG PO TABS: 650.0000 mg | ORAL_TABLET | Freq: Four times a day (QID) | ORAL | Status: DC | PRN

## 2012-03-14 MED ORDER — SUCCINYLCHOLINE CHLORIDE 20 MG/ML IJ SOLN
INTRAMUSCULAR | Status: DC | PRN
  Administered 2012-03-14: 100 mg via INTRAVENOUS

## 2012-03-14 MED ORDER — LACTATED RINGERS IV SOLN
INTRAVENOUS | Status: DC | PRN
  Administered 2012-03-14 (×2): via INTRAVENOUS

## 2012-03-14 MED ORDER — PROMETHAZINE HCL 25 MG/ML IJ SOLN
INTRAMUSCULAR | Status: AC
  Filled 2012-03-14: qty 1

## 2012-03-14 MED ORDER — ACETAMINOPHEN 650 MG RE SUPP: 650.0000 mg | Freq: Four times a day (QID) | RECTAL | Status: DC | PRN

## 2012-03-14 MED ORDER — BUPIVACAINE HCL 0.5 % IJ SOLN
INTRAMUSCULAR | Status: DC | PRN
  Administered 2012-03-14: 10 mL

## 2012-03-14 MED ORDER — LACTATED RINGERS IV SOLN: INTRAVENOUS | Status: DC

## 2012-03-14 MED ORDER — PROPOFOL 10 MG/ML IV BOLUS
INTRAVENOUS | Status: DC | PRN
  Administered 2012-03-14: 100 mg via INTRAVENOUS

## 2012-03-14 MED ORDER — MENTHOL 3 MG MT LOZG: 1.0000 | LOZENGE | OROMUCOSAL | Status: DC | PRN

## 2012-03-14 MED ORDER — ONDANSETRON HCL 4 MG/2ML IJ SOLN
4.0000 mg | Freq: Once | INTRAMUSCULAR | Status: AC
  Administered 2012-03-14: 4 mg via INTRAVENOUS

## 2012-03-14 MED ORDER — METOCLOPRAMIDE HCL 5 MG/ML IJ SOLN: 5.0000 mg | Freq: Three times a day (TID) | INTRAMUSCULAR | Status: DC | PRN

## 2012-03-14 MED ORDER — ONDANSETRON HCL 4 MG/2ML IJ SOLN
4.0000 mg | Freq: Four times a day (QID) | INTRAMUSCULAR | Status: DC | PRN
  Administered 2012-03-14: 4 mg via INTRAVENOUS
  Filled 2012-03-14: qty 2

## 2012-03-14 MED ORDER — CLINDAMYCIN PHOSPHATE 900 MG/50ML IV SOLN
900.0000 mg | Freq: Once | INTRAVENOUS | Status: AC
  Administered 2012-03-14: 900 mg via INTRAVENOUS
  Filled 2012-03-14: qty 50

## 2012-03-14 MED ORDER — HYDROCODONE-ACETAMINOPHEN 5-325 MG PO TABS
1.0000 | ORAL_TABLET | Freq: Four times a day (QID) | ORAL | Status: DC | PRN
  Administered 2012-03-15 (×3): 1 via ORAL
  Filled 2012-03-14 (×3): qty 1

## 2012-03-14 MED ORDER — FENTANYL CITRATE 0.05 MG/ML IJ SOLN
INTRAMUSCULAR | Status: DC | PRN
  Administered 2012-03-14 (×4): 50 ug via INTRAVENOUS

## 2012-03-14 MED ORDER — ONDANSETRON HCL 4 MG PO TABS: 4.0000 mg | ORAL_TABLET | Freq: Four times a day (QID) | ORAL | Status: DC | PRN

## 2012-03-14 MED ORDER — MORPHINE SULFATE 2 MG/ML IJ SOLN
0.5000 mg | INTRAMUSCULAR | Status: DC | PRN
  Administered 2012-03-14: 0.5 mg via INTRAVENOUS
  Filled 2012-03-14: qty 1

## 2012-03-14 MED ORDER — HYDROMORPHONE HCL PF 1 MG/ML IJ SOLN
0.2500 mg | INTRAMUSCULAR | Status: DC | PRN
  Administered 2012-03-14 (×2): 0.5 mg via INTRAVENOUS

## 2012-03-14 SURGICAL SUPPLY — 62 items
BAG ZIPLOCK 12X15 (MISCELLANEOUS) IMPLANT
BANDAGE GAUZE ELAST BULKY 4 IN (GAUZE/BANDAGES/DRESSINGS) ×9 IMPLANT
BIT DRILL 2 FAST STEP (BIT) ×3 IMPLANT
BIT DRILL 2.5X4 QC (BIT) ×3 IMPLANT
BNDG COHESIVE 4X5 TAN STRL (GAUZE/BANDAGES/DRESSINGS) ×3 IMPLANT
CHLORAPREP W/TINT 10.5 ML (MISCELLANEOUS) ×3 IMPLANT
CLOTH BEACON ORANGE TIMEOUT ST (SAFETY) ×6 IMPLANT
DRAPE C-ARM 42X72 X-RAY (DRAPES) ×3 IMPLANT
DRAPE SURG 17X11 SM STRL (DRAPES) ×6 IMPLANT
DRSG EMULSION OIL 3X16 NADH (GAUZE/BANDAGES/DRESSINGS) ×3 IMPLANT
DRSG EMULSION OIL 3X3 NADH (GAUZE/BANDAGES/DRESSINGS) ×3 IMPLANT
DRSG MEPILEX BORDER 4X4 (GAUZE/BANDAGES/DRESSINGS) ×3 IMPLANT
DRSG PAD ABDOMINAL 8X10 ST (GAUZE/BANDAGES/DRESSINGS) IMPLANT
DURAPREP 26ML APPLICATOR (WOUND CARE) IMPLANT
ELECT REM PT RETURN 9FT ADLT (ELECTROSURGICAL) ×3
ELECTRODE REM PT RTRN 9FT ADLT (ELECTROSURGICAL) ×2 IMPLANT
GLOVE BIOGEL PI IND STRL 7.5 (GLOVE) ×4 IMPLANT
GLOVE BIOGEL PI IND STRL 8 (GLOVE) ×4 IMPLANT
GLOVE BIOGEL PI INDICATOR 7.5 (GLOVE) ×2
GLOVE BIOGEL PI INDICATOR 8 (GLOVE) ×2
GLOVE ECLIPSE 8.0 STRL XLNG CF (GLOVE) IMPLANT
GLOVE ORTHO TXT STRL SZ7.5 (GLOVE) ×6 IMPLANT
GLOVE SURG ORTHO 8.0 STRL STRW (GLOVE) ×3 IMPLANT
GLOVE SURG SS PI 7.5 STRL IVOR (GLOVE) ×6 IMPLANT
GOWN BRE IMP PREV XXLGXLNG (GOWN DISPOSABLE) ×6 IMPLANT
GOWN STRL NON-REIN LRG LVL3 (GOWN DISPOSABLE) ×3 IMPLANT
GUIDEWIRE THREADED 2.8 (WIRE) ×12 IMPLANT
K-WIRE 1.6 (WIRE) ×1
K-WIRE FX5X1.6XNS BN SS (WIRE) ×2
KWIRE FX5X1.6XNS BN SS (WIRE) ×2 IMPLANT
MANIFOLD NEPTUNE II (INSTRUMENTS) ×3 IMPLANT
NEEDLE HYPO 22GX1.5 SAFETY (NEEDLE) ×3 IMPLANT
NS IRRIG 1000ML POUR BTL (IV SOLUTION) ×6 IMPLANT
PACK GENERAL/GYN (CUSTOM PROCEDURE TRAY) ×3 IMPLANT
PAD CAST 4YDX4 CTTN HI CHSV (CAST SUPPLIES) ×2 IMPLANT
PADDING CAST COTTON 4X4 STRL (CAST SUPPLIES) ×1
PADDING CAST COTTON 6X4 STRL (CAST SUPPLIES) IMPLANT
PEG SUBCHONDRAL SMOOTH 2.0X18 (Peg) ×3 IMPLANT
PEG SUBCHONDRAL SMOOTH 2.0X20 (Peg) ×6 IMPLANT
PEG SUBCHONDRAL SMOOTH 2.0X22 (Peg) ×12 IMPLANT
PIN THREADED GUIDE ACE (PIN) ×9 IMPLANT
PLATE SHORT 24.4X51.3 LT (Plate) ×3 IMPLANT
POSITIONER SURGICAL ARM (MISCELLANEOUS) ×3 IMPLANT
SCREW BN 12X3.5XNS CORT TI (Screw) ×6 IMPLANT
SCREW CANN 22X6.5X100 (Screw) ×2 IMPLANT
SCREW CANN 6.5 100MM (Screw) ×1 IMPLANT
SCREW CANN 6.5 90MM (Screw) ×1 IMPLANT
SCREW CANN 6.5 95MM (Screw) ×1 IMPLANT
SCREW CANN LG 6.5 FLT 90X22 (Screw) ×2 IMPLANT
SCREW CANN LG 6.5 FLT 95X22 (Screw) ×2 IMPLANT
SCREW CORT 3.5X12 (Screw) ×3 IMPLANT
SPLINT FIBERGLASS 4X15 (CAST SUPPLIES) ×3 IMPLANT
SPONGE GAUZE 4X4 12PLY (GAUZE/BANDAGES/DRESSINGS) ×3 IMPLANT
STAPLER VISISTAT 35W (STAPLE) ×3 IMPLANT
SUT VIC AB 2-0 CT1 27 (SUTURE) ×2
SUT VIC AB 2-0 CT1 TAPERPNT 27 (SUTURE) ×4 IMPLANT
SUT VIC AB 2-0 CT2 27 (SUTURE) ×6 IMPLANT
SUT VICRYL RAPIDE 4/0 PS 2 (SUTURE) ×3 IMPLANT
SYRINGE 10CC LL (SYRINGE) ×3 IMPLANT
TOWEL OR 17X26 10 PK STRL BLUE (TOWEL DISPOSABLE) ×6 IMPLANT
TRAY FOLEY CATH 14FRSI W/METER (CATHETERS) IMPLANT
WATER STERILE IRR 1500ML POUR (IV SOLUTION) IMPLANT

## 2012-03-14 NOTE — Anesthesia Preprocedure Evaluation (Addendum)
Anesthesia Evaluation  Patient identified by MRN, date of birth, ID band Patient awake    Reviewed: Allergy & Precautions, H&P , NPO status , Patient's Chart, lab work & pertinent test results, reviewed documented beta blocker date and time   Airway Mallampati: II TM Distance: >3 FB Neck ROM: full    Dental No notable dental hx.    Pulmonary COPD COPD inhaler and oxygen dependent,  breath sounds clear to auscultation  Pulmonary exam normal       Cardiovascular hypertension, Pt. on medications + CAD and +CHF + dysrhythmias Atrial Fibrillation Rhythm:regular Rate:Normal  EF 45%.  Mod. Aortic stenosis.  Mild pulmonary htn.  Non obstructing CAD in 2005.  Diastolic CHF.  NSR and junctional bradycardia on last 2 ECGs.  Prolonged QT.   Neuro/Psych Bilateral CEA negative neurological ROS  negative psych ROS   GI/Hepatic negative GI ROS, Neg liver ROS,   Endo/Other  negative endocrine ROS  Renal/GU Renal InsufficiencyRenal diseaseCr. 1.72.  Acute on chronic renal disease  negative genitourinary   Musculoskeletal   Abdominal   Peds  Hematology negative hematology ROS (+) anemia , Hgb. 9.5   Anesthesia Other Findings   Reproductive/Obstetrics negative OB ROS                         Anesthesia Physical Anesthesia Plan  ASA: III  Anesthesia Plan: General   Post-op Pain Management:    Induction: Intravenous  Airway Management Planned: Oral ETT  Additional Equipment:   Intra-op Plan:   Post-operative Plan: Extubation in OR  Informed Consent: I have reviewed the patients History and Physical, chart, labs and discussed the procedure including the risks, benefits and alternatives for the proposed anesthesia with the patient or authorized representative who has indicated his/her understanding and acceptance.   Dental Advisory Given  Plan Discussed with: CRNA and Surgeon  Anesthesia Plan Comments:           Anesthesia Quick Evaluation

## 2012-03-14 NOTE — Progress Notes (Signed)
[facility] PROGRESS NOTE  [name] [name] WUJ:[id number] DOB: 09/18/1923 DOA: 03/13/2012 PCP: Carilyn Goodpasture, PA  Assessment/Plan: -Dizziness and fall: Patient recently evaluated as an outpatient with findings consistently with a Vestibular dysfunction. Plan is for outpatient vestibular rehabilitation . After surgery will also ask PT and OT to evaluate and provide treatment recommendations on the patient. Patient will be admitted to telemetry.  -PTalso bradycardic overnight, and amio, cardizem held- will resume amioifhr reamins stable today, holding off cardizem  For now -consult cardiology for further recs -vit B12 &TSH NL, await vit D levels 2-Closed left hip fracture:  -per ortho, surgery planned this afternoon  -ortho to follow and advise on when to resume xarelto 3-Hyperlipidemia: Continue statins.  4-Atrial fibrillation with brady to 30s: last pm, HR now in the 30s,  Will resume amiodarone.  Once patient is done with surgery plan is to resume her xarelto whennok pr ortho.  5-CHF, chronic: Diastolic and systolic heart failure with ejection fraction 45-50% and diffuse hypokinesis on last 2-D echo July 2013.  -Will follow close intake and output and also daily weights.  -Continue current medication regimen.  -remains compensated. 6-CAD (coronary artery disease) of artery bypass graft:  -No chest pain. No acute ischemic changes on her EKG. Continue current medication regimen.  7-Acute on chronic renal failure: Most likely secondary to mild dehydration. Will provide gentle hydration and follow renal function. Myoglobinuria secondary to acute fractures might be also playing a role in the patient acute renal failure. At baseline patient creatinine is 1.2-1.4 with a chronic kidney disease stage 2-3.  -cr 1.7, today, follow and recheck 8-Anemia: Hemoglobin 11.3 on admission. Patient with history of anemia of chronic disease. We'll continue iron supplementation and follow hemoglobin trend.  2/2 history of coronary artery disease plan is to transfuse the patient hemoglobin less than 8. -hgb  Remains >8 follow 9-Left wrist fracture: Close reduction done by Dr. Janee Morn in the emergency department. -pain managment 10-Gout: Without any acute flare. Will continue allopurinol.      Code Status: full Family Communication: daughter at bedside Disposition Plan: likely snf for rehab   Consultants:  Ortho  Cards-eval pending  Procedures:  none  Antibiotics:  none  HPI/Subjective: Denies CP, dizziness, no pain at this time. Pt also denies SOB  Objective: Filed Vitals:   03/14/12 0000 03/14/12 0400 03/14/12 0534 03/14/12 0815  BP:   132/46   Pulse:   61   Temp:   98 F (36.7 C)   TempSrc:   Oral   Resp: 20 16 18    Height:      Weight:      SpO2: 99% 7% 100% 97%    Intake/Output Summary (Last 24 hours) at 03/14/12 0952 Last data filed at 03/14/12 0535  Gross per 24 hour  Intake      0 ml  Output   1100 ml  Net  -1100 ml   Filed Weights   03/13/12 1945  Weight: 69.264 kg (152 lb 11.2 oz)    Exam:   General:  Obese elderly female in NAD  Cardiovascular: regular, nl s1s2  Respiratory: CTAB  Abdomen: soft,+BS ,NT/ND  Extremities:LUE with cast in sling, LE with no edema  Data Reviewed: Basic Metabolic Panel:  Lab 03/14/12 9562 03/13/12 1335  NA 137 135  K 4.3 4.7  CL 103 101  CO2 27 27  GLUCOSE 109* 152*  BUN 22 21  CREATININE 1.72* 1.68*  CALCIUM 8.6 9.3  MG -- --  PHOS -- --  Liver Function Tests:  Lab 03/14/12 0635  AST --  ALT --  ALKPHOS --  BILITOT --  PROT --  ALBUMIN 2.5*   No results found for this basename: LIPASE:5,AMYLASE:5 in the last 168 hours No results found for this basename: AMMONIA:5 in the last 168 hours CBC:  Lab 03/14/12 0635 03/13/12 1335  WBC 6.3 5.6  NEUTROABS -- --  HGB 9.5* 11.3*  HCT 29.7* 34.1*  MCV 90.3 88.3  PLT 161 218   Cardiac Enzymes:  Lab 03/14/12 0635 03/14/12 0035 03/13/12  1928  CKTOTAL -- -- --  CKMB -- -- --  CKMBINDEX -- -- --  TROPONINI <0.30 <0.30 <0.30   BNP (last 3 results)  Basename 11/28/11 1353 10/01/11 0718 09/29/11 0008  PROBNP 5095.0* 1563.0* 2283.0*   CBG: No results found for this basename: GLUCAP:5 in the last 168 hours  No results found for this or any previous visit (from the past 240 hour(s)).   Studies: Dg Wrist 2 Views Left  03/13/2012  *RADIOLOGY REPORT*  Clinical Data: Closed reduction, wrist pain with fracture, fell.  LEFT WRIST - 2 VIEW  Comparison: Priors earlier today.  Findings: Comminuted intra-articular distal radius fracture and ulnar styloid avulsion are redemonstrated.  It is difficult to assess for interval improvement because no true lateral film was obtained initially. Slight dorsal angulation noted.  IMPRESSION: Closed reduction as described.   Original Report Authenticated By: Davonna Belling, M.D.    Dg Wrist Complete Left  03/13/2012  *RADIOLOGY REPORT*  Clinical Data: Fall with left wrist pain and swelling.  LEFT WRIST - COMPLETE 3+ VIEW  Comparison: None  Findings: A comminuted intra-articular fracture of the distal radius is noted with mild apex palmar angulation. An ulnar styloid fracture is also present. Marked degenerative changes at the first carpometacarpal joint are present. Diffuse osteopenia is noted. There is no evidence of subluxation or dislocation.  IMPRESSION: Comminuted intrarticular distal radial fracture and ulnar styloid fracture.   Original Report Authenticated By: Harmon Pier, M.D.    Dg Hip Complete Left  03/13/2012  *RADIOLOGY REPORT*  Clinical Data: Fall, left hip pain.  LEFT HIP - COMPLETE 2+ VIEW  Comparison: None  Findings: There is a left femoral neck fracture.  Mild valgus angulation.  Slight impaction laterally.  No subluxation or dislocation.  SI joints are symmetric and unremarkable.  Right hip unremarkable.  IMPRESSION: Left femoral neck fracture with mild valgus angulation.   Original Report  Authenticated By: Charlett Nose, M.D.    Ct Head Wo Contrast  03/13/2012  *RADIOLOGY REPORT*  Clinical Data:  Fall  CT HEAD WITHOUT CONTRAST CT CERVICAL SPINE WITHOUT CONTRAST  Technique:  Multidetector CT imaging of the head and cervical spine was performed following the standard protocol without intravenous contrast.  Multiplanar CT image reconstructions of the cervical spine were also generated.  Comparison:   None  CT HEAD  Findings: Chronic infarct left parietal lobe.  Mild chronic microvascular ischemia in the white matter.  No acute infarct. Negative for hemorrhage or mass.  Negative for skull fracture. Mild chronic sinusitis.  IMPRESSION: Chronic left parietal infarct.  No acute abnormality.  CT CERVICAL SPINE  Findings: Prior left parotidectomy.  Surgical clips around the right carotid from prior carotid endarterectomy.  Negative for fracture.  2 mm anterior slip C3-4.  Mild disc degeneration and spondylosis at C3-4.  Moderate to advanced spondylosis at C4-5 and C5-6.  Moderate spondylosis at C6-7.  Facet degeneration is present especially on the left side.  No mass lesion is identified.  IMPRESSION: Moderate cervical degenerative change.  Negative for fracture.   Original Report Authenticated By: Janeece Riggers, M.D.    Ct Cervical Spine Wo Contrast  03/13/2012  *RADIOLOGY REPORT*  Clinical Data:  Fall  CT HEAD WITHOUT CONTRAST CT CERVICAL SPINE WITHOUT CONTRAST  Technique:  Multidetector CT imaging of the head and cervical spine was performed following the standard protocol without intravenous contrast.  Multiplanar CT image reconstructions of the cervical spine were also generated.  Comparison:   None  CT HEAD  Findings: Chronic infarct left parietal lobe.  Mild chronic microvascular ischemia in the white matter.  No acute infarct. Negative for hemorrhage or mass.  Negative for skull fracture. Mild chronic sinusitis.  IMPRESSION: Chronic left parietal infarct.  No acute abnormality.  CT CERVICAL SPINE   Findings: Prior left parotidectomy.  Surgical clips around the right carotid from prior carotid endarterectomy.  Negative for fracture.  2 mm anterior slip C3-4.  Mild disc degeneration and spondylosis at C3-4.  Moderate to advanced spondylosis at C4-5 and C5-6.  Moderate spondylosis at C6-7.  Facet degeneration is present especially on the left side.  No mass lesion is identified.  IMPRESSION: Moderate cervical degenerative change.  Negative for fracture.   Original Report Authenticated By: Janeece Riggers, M.D.    Dg Chest Portable 1 View  03/13/2012  *RADIOLOGY REPORT*  Clinical Data: Preop evaluation.  History of a fall.  PORTABLE CHEST - 1 VIEW  Comparison: 12/08/2011.  Findings: There is borderline cardiac silhouette enlargement accentuated by AP portable supine technique.  There is mild vascular congestion pattern with slight increased interstitial reticular markings.  There is upper lobe vascular prominence but this is a supine image.  No consolidation or pleural effusion is seen.  Mediastinal and hilar contours appear stable.  Surgical clips are seen in the lower right neck soft tissues. Scoliosis convexity to the left.,  IMPRESSION: Borderline cardiac silhouette enlargement.  Mild vascular congestion pattern with slight increased reticular markings accentuated by supine positioning.  No consolidation or pleural effusion.  No evidence of fracture.   Original Report Authenticated By: Onalee Hua Call     Scheduled Meds:   . allopurinol  100 mg Oral Daily  . amiodarone  100 mg Oral BID  . antiseptic oral rinse  15 mL Mouth Rinse BID  . atorvastatin  40 mg Oral QPM  . budesonide-formoterol  2 puff Inhalation BID  . diltiazem  240 mg Oral Daily  . docusate sodium  100 mg Oral BID  . ferrous fumarate  1 tablet Oral BID  . furosemide  20 mg Oral Daily  . pantoprazole  40 mg Oral Daily  . raloxifene  60 mg Oral Daily  . saccharomyces boulardii  250 mg Oral BID   Continuous Infusions:   Principal  Problem:  *Closed left hip fracture Active Problems:  Hyperlipidemia  Atrial fibrillation  Diastolic CHF, chronic  CAD (coronary artery disease) of artery bypass graft  Acute on chronic renal failure  Anemia  Left wrist fracture  Gout    Time spent: >30    [facility]  [facility] Pager [phone]. If 8PM-8AM, please contact night-coverage at [email or web address], password [facility] 03/14/2012, 9:52 AM  LOS: 1 day

## 2012-03-14 NOTE — Progress Notes (Signed)
Shift event: RN paged NP 2/2 pt having junctional brady in the high 30-40s on tele and also on EKG. Pt asymptomatic. Asleep on and off.  Held 10 pm Amiadorone and cont'd to watch pt. Later, RN called again and rhythm/rate same. This NP called T. Mayford Knife, MD, cardio fellow on call. Explained situation and Dr. Mayford Knife recommended holding Amiodarone and Cardizem at this time and watch her. Also, have attending get cards consult this am prior to pt going to surgery. Dr.Turner said pt will hopefully convert back to NSR.  NP called RN to relate cardio info. At that time, pt had already converted back to NSR with HR in the 60s.  Will report event to oncoming day attending for further evaluation on first shift.  Jimmye Norman, NP [facility]

## 2012-03-14 NOTE — Anesthesia Postprocedure Evaluation (Signed)
  Anesthesia Post-op Note  Patient: [name] [name]  Procedure(s) Performed: Procedure(s) (LRB): OPEN REDUCTION INTERNAL FIXATION (ORIF) SCAPHOID WITH DISTAL RADIUS GRAFT (Left) CANNULATED HIP PINNING (Left)  Patient Location: PACU  Anesthesia Type: General  Level of Consciousness: awake and alert   Airway and Oxygen Therapy: Patient Spontanous Breathing  Post-op Pain: mild  Post-op Assessment: Post-op Vital signs reviewed, Patient's Cardiovascular Status Stable, Respiratory Function Stable, Patent Airway and No signs of Nausea or vomiting  Last Vitals:  Filed Vitals:   03/14/12 1800  BP: 160/47  Pulse: 51  Temp:   Resp: 23    Post-op Vital Signs: stable   Complications: No apparent anesthesia complications

## 2012-03-14 NOTE — Plan of Care (Signed)
Problem: Phase I Progression Outcomes Goal: Pre op Protime within normal limits Outcome: Not Met (add Reason) high

## 2012-03-14 NOTE — Progress Notes (Signed)
Patient heart rate down between 36-low 40s, patient awake resting in bed. Denies any pain/distress. EKG down, showed junctional brady. K Kirby-NP, held  Amiodarone and will continue to monitor patient.

## 2012-03-14 NOTE — Consult Note (Signed)
Patient ID: [name] [name] MRN: [id number] DOB/AGE: 77-Oct-1925 77 y.o.  Admit date: 03/13/2012 Referring Physician: [place] Primary Cardiologist: Clifton James Reason for Consultation: h/o atrial fibrillation, bradycardia.  HPI: 79 yo WF with history of non-obstructive CAD, atrial fibrillation on chronic anti-coagulation, HTN, HLD, COPD, carotid artery stenosis s/p bilateral CEA admitted after episode of dizziness with fall, sustaining a left hip fracture and left wrist fracture.   Recent history:  Last cath September 2005 with proximal LAD less than 20%, D1 20-30%, ostial RCA 40-50%, proximal RCA 20%, EF 60%. She was admitted 7/24-10/06/11 with dyspnea and noted to have atrial fibrillation with RVR. V/Q scan was negative for pulmonary embolism. Of note, she had recently been discharged after a prolonged hospitalization in 6/13 for Salmonella enteritis with septic shock. Xarelto was started for anticoagulation. She had signs of volume overload and was treated with IV Lasix. Echocardiogram 09/29/11: Mild LVH, EF 45-50%, diffuse HK, mild to moderate aortic stenosis, mean gradient 12 mmHg, moderate MAC, mild MR, moderate LAE, moderate RAE, question small secundum ASD with left to right flow not seen in 4 chamber view, PASP 35-39 (mild pulmonary hypertension). She was placed on amiodarone and underwent TEE-DCCV. However, she reverted back to atrial fibrillation. Amiodarone was continued. It was felt that she could undergo repeat DCCV after fully loaded with amiodarone. TEE on 10/04/11: Mild LVH, EF 55%, mild MR, no defect or PFO-echo contrast study showed no right-to-left atrial level shunt. She saw Tereso Newcomer, PA-C in our office 10/14/11 and was feeling weak. Anemia was stable and BMET was ok. She developed diarrhea and was readmitted 8/28/013-11/04/11 and found to have recurrent C. Difficile infection which has been treated with Flagyl. Her diarrhea resolved before discharge but her anemia worsened and she was given  1 unit pRBC. She was re-admitted 9/13-9/16/13 with pneumonia and again 9/23-9/28/13 with upper GI bleeding. She presented with dark stools for about one week. She was seen by gastroenterology. EGD was negative for source of bleeding but did demonstrate extensive esophageal candidiasis. She was treated with nystatin and eventually transitioned over to fluconazole. Gastroenterology felt that anticoagulation could be resumed. She began to have diarrhea and C. difficile PCR was positive. She was placed on oral vancomycin. She was noted to be hypoxic. She had recently been treated for pneumonia. Chest x-ray was concerning for edema with small bilateral effusions and vascular congestion. She was given IV Lasix and discharged home on oxygen therapy. Of note, Xarelto was reduced to 15 mg QD due to low GFR while hospitalized for pneumonia.   She had an episode junctional bradycardia while sleeping on 03/13/12. Converted to NSR on 03/14/12. No complaints except left wrist and hip pain. No chest pain or SOB. Admits to having episodes of dizziness at home. NO change in weight. No LE edema.    Past Medical History  Diagnosis Date  . Hypertension   . Hyperlipidemia   . COPD (chronic obstructive pulmonary disease)   . Osteoporosis   . Arthritis   . H/O hiatal hernia   . Chronic back pain   . CAD (coronary artery disease)     LHC 9/05:  pLAD less than 20%, D1 20-30%, ostial RCA 40-50%, proximal RCA 20%, EF 60%.  . C. difficile diarrhea   . Gout   . Atrial fibrillation     a. failed DCCV => b. amiodarone added  . Carotid stenosis     s/p bilat CEA  . C. difficile colitis     10/2011;  11/2011  . Salmonella enteritis 08/2011    c/b septic shock  . Hx of echocardiogram     a. Echocardiogram 09/29/11: Mild LVH, EF 45-50%, diffuse HK, mild to moderate aortic stenosis, mean gradient 12 mmHg, moderate MAC, mild MR, moderate LAE, moderate RAE, question small secundum ASD with left to right flow not seen in 4 chamber  view, PASP 35-39 (mild pulmonary hypertension). ;  b.  TEE on 10/04/11: Mild LVH, EF 55%, mild MR, no defect or PFO  . Pneumonia 11/2101    Family History  Problem Relation Age of Onset  . Hemochromatosis Sister   . Diabetes type II Sister     History   Social History  . Marital Status: Widowed    Spouse Name: N/A    Number of Children: N/A  . Years of Education: N/A   Occupational History  . Not on file.   Social History Main Topics  . Smoking status: Former Smoker -- 1.0 packs/day for 20 years    Types: Cigarettes    Quit date: 09/01/1983  . Smokeless tobacco: Never Used  . Alcohol Use: No  . Drug Use: No  . Sexually Active: No   Other Topics Concern  . Not on file   Social History Narrative  . No narrative on file    Past Surgical History  Procedure Date  . Carotid endarterectomy     bilateral  . Cataract extraction, bilateral   . Joint replacement 2008    Right Total Knee  . Anal fistula repair   . Left parotidectomy   . Eye surgery   . Flexible sigmoidoscopy 09/02/2011    Procedure: FLEXIBLE SIGMOIDOSCOPY;  Surgeon: Theda Belfast, MD;  Location: [facility] ENDOSCOPY;  Service: Endoscopy;  Laterality: N/A;  . Flexible sigmoidoscopy 09/03/2011    Procedure: FLEXIBLE SIGMOIDOSCOPY;  Surgeon: Theda Belfast, MD;  Location: [facility] ENDOSCOPY;  Service: Endoscopy;  Laterality: N/A;  . Tee without cardioversion 10/04/2011    Procedure: TRANSESOPHAGEAL ECHOCARDIOGRAM (TEE);  Surgeon: Laurey Morale, MD;  Location: [facility] ENDOSCOPY;  Service: Cardiovascular;  Laterality: N/A;  to be carelinked here by 1230-verified 7/29/dl  . Cardioversion 10/04/2011    Procedure: CARDIOVERSION;  Surgeon: Laurey Morale, MD;  Location: [facility] ENDOSCOPY;  Service: Cardiovascular;  Laterality: N/A;  . Esophagogastroduodenoscopy 11/29/2011    Procedure: ESOPHAGOGASTRODUODENOSCOPY (EGD);  Surgeon: Florencia Reasons, MD;  Location: [facility] ENDOSCOPY;  Service: Endoscopy;  Laterality: N/A;  recent h/o resp  failure/pneumonia    Allergies  Allergen Reactions  . Baby Powder (Methylbenzethonium) Shortness Of Breath  . Demerol (Meperidine) Hives  . Penicillins Hives    Hospital Meds:     . allopurinol  100 mg Oral Daily  . amiodarone  100 mg Oral BID  . antiseptic oral rinse  15 mL Mouth Rinse BID  . atorvastatin  40 mg Oral QPM  . budesonide-formoterol  2 puff Inhalation BID  . diltiazem  240 mg Oral Daily  . docusate sodium  100 mg Oral BID  . ferrous fumarate  1 tablet Oral BID  . furosemide  20 mg Oral Daily  . pantoprazole  40 mg Oral Daily  . raloxifene  60 mg Oral Daily    Review of systems complete and found to be negative unless listed above    Physical Exam: Blood pressure 110/40, pulse 58, temperature 98.1 F (36.7 C), temperature source Oral, resp. rate 20, height 5\' 2"  (1.575 m), weight 152 lb 11.2 oz (69.264 kg), SpO2 99.00%.  General: Well developed, well nourished, NAD  HEENT: OP clear, mucus membranes moist  SKIN: warm, dry. No rashes.  Neuro: No focal deficits  Musculoskeletal: Muscle strength 5/5 all ext  Psychiatric: Mood and affect normal  Neck: No JVD, no carotid bruits, no thyromegaly, no lymphadenopathy.  Lungs:Clear bilaterally, no wheezes, rhonci, crackles  Cardiovascular: Regular rate and rhythm. Systolic murmur noted. No gallops or rubs.  Abdomen:Soft. Bowel sounds present. Non-tender.  Extremities: No lower extremity edema. Pulses are 2 + in the bilateral DP/PT.   Labs:   Lab Results  Component Value Date   WBC 7.5 03/15/2012   HGB 9.3* 03/15/2012   HCT 29.1* 03/15/2012   MCV 90.1 03/15/2012   PLT 175 03/15/2012     Lab 03/15/12 0447  NA 135  K 4.3  CL 99  CO2 32  BUN 19  CREATININE 1.49*  CALCIUM 8.6  PROT --  BILITOT --  ALKPHOS --  ALT --  AST --  GLUCOSE 115*   Lab Results  Component Value Date   CKTOTAL 32 09/29/2011   CKMB 2.4 09/29/2011   TROPONINI <0.30 03/14/2012     Tele: NSR with 1st degree AV block.   ASSESSMENT AND  PLAN:   1. Atrial fibrillation: Paroxysmal. Currently sinus with 1st degree AV block. She has been anti-coagulated with Xarelto at home. This has been held following her fall. Will not likely resume anti-coagulation given her age and now documented instability with fall. This will be readdressed before discharge. She has been rate controlled with Cardizem and amiodarone and doing well with this regimen at home although it is unclear if she is having bradycardia at home causing some of her dizziness. Episode of junctional bradycardia 03/14/11 while sleeping. Would continue amiodarone for now and lower Cardizem dose to 120 mg po Qdaily. Will follow rates on telemetry. She will need a monitor arranged at time of discharge to follow heart rates at home.     2.  Chronic Diastolic CHF: Weight stable. Last LVEF 45-50% July 2013.  3. Coronary Artery Disease: Stable. No angina. She should be started on an ASA if her Xarelto is not restarted. Start ASA when ok from surgical perspective.  4. Left hip fracture: Surgical repair per ortho yesterday.    Signed: Nandini Bogdanski 03/15/2012, 7:09 AM

## 2012-03-14 NOTE — Op Note (Signed)
03/13/2012 - 03/14/2012  5:52 PM  PATIENT:  [name] [name]  79 y.o. female  PRE-OPERATIVE DIAGNOSIS:  Displaced left distal radius fx and valgus impacted left femoral neck fx  POST-OPERATIVE DIAGNOSIS:  Same  PROCEDURE:  ORIF left distal radius fx & Percutaneous pinning of left femoral neck fx  SURGEON: Cliffton Asters. Janee Morn, MD  PHYSICIAN ASSISTANT: None  ANESTHESIA:  general  SPECIMENS:  None  DRAINS:   None  PREOPERATIVE INDICATIONS:  [name] [name] is a  79 y.o. female with a diagnosis listed above. Surgical management was recommended to promoted healing and restoration of function  The risks benefits and alternatives were discussed with the patient preoperatively including but not limited to the risks of infection, bleeding, nerve injury, cardiopulmonary complications, the need for revision surgery, among others, and the patient verbalized understanding and consented to proceed.  OPERATIVE IMPLANTS: Biomet DVR plate, standard, short; 3-6.89mm cannulated screws from [employer]  OPERATIVE FINDINGS: Comminuted intra-articular left distal radius fracture, with near anatomic reduction following operative treatment with standard fixed volar plating. Left femoral neck fracture was valgus impacted with slight apex anterior angulation as well. The femoral head and shaft moved together as a unit under fluoroscopy. 3 separate cannulated screws were placed.  OPERATIVE PROCEDURE: The patient was escorted to the operative theater, placed in a supine position and general anesthesia was a Optician, dispensing. She was then repositioned on the OSI fracture table with the left arm on operative arm board. Tourniquet was applied but not inflated. The hand was prescribed with a Hibiclens scrub brush before being formally prepped with chlor prep and draped in usual sterile fashion. Surgical timeout was observed and prophylactic AmBisome administered. A curvilinear incision was fashioned along the FCR axis. The limb was  exsanguinated with an Esmarch bandage and tourniquet inflated to 225 mm mercury. The skin was incised sharply with a scalpel the FCR axis was exploited deeply. The pronator quadratus was reflected in an L-shaped which was ulnar we base. The distal portion was quite shredded from the fracture. The fracture was provisionally reduced and a plate secured to the shaft in an appropriate position and orientation. It was secured with a screw in the slotted hole. An additional K wire was placed into the plate and secured to the shaft. The distal fragments were then reduced to the plate and sequentially the distal holes were filled and drilled with smooth pegs. The hardware appeared to be juxta articular and not intra-articular. The remaining holes in the plate were placed and final images were obtained. Alignment was near-anatomic. The pronator was then repaired after everything was copiously irrigated. It was repaired with 2-0 Vicryl sutures. This covered portions of the plate but some of the distal plate was uncovered because of shredding of the muscle. Tourniquet was released and additional hemostasis obtained and the skin was closed with 2-0 Vicryl deep dermal buried sutures followed by running 4-0 Vicryl subcuticular suture and the skin. A bulky hand dressing was applied with volar plaster component. The patient was then repositioned and slid downward. The left thigh was prepped and draped with a shower curtain drape. Fluoroscopy was used to assess the mobility or lack thereof of the 2 fracture fragments. It was decided to proceed with cannulated screw fixation. A small incision was made in the lateral side of the thigh and through this a guidewire was placed for the first screw. He wound up being superior and posterior. It was placed with the screws without drilling as they are self  drilling screws. Once this was placed, 2 additional screws were placed with an effort to create dispersion within the neck and head. Final  images were obtained. The wound was irrigated and closed with 2-0 Vicryl sutures in the deep dermal layer followed by staples at the skin. A Mepilex dressing was applied. She was awakened and taken to room stable condition, breathing spontaneously.  DISPOSITION: Patient will return to the floor under the fractured hip care pathway. She'll ultimately return to the office 2 weeks from surgery to have her wound assessed. At that time was to get x-rays of the left hip as well as the left wrist including frontal lateral, out of the splint. She'll be transitioned to a removable wrist splint at that time.

## 2012-03-14 NOTE — Transfer of Care (Signed)
Immediate Anesthesia Transfer of Care Note  Patient: [name] [name]  Procedure(s) Performed: Procedure(s) (LRB) with comments: OPEN REDUCTION INTERNAL FIXATION (ORIF) SCAPHOID WITH DISTAL RADIUS GRAFT (Left) - DVR wrist fracture set/ hand innovation CANNULATED HIP PINNING (Left) - Biomet 6.5 cannulated screws  Patient Location: PACU  Anesthesia Type:General  Level of Consciousness: awake, sedated and patient cooperative  Airway & Oxygen Therapy: Patient Spontanous Breathing and Patient connected to face mask oxygen  Post-op Assessment: Report given to PACU RN and Post -op Vital signs reviewed and stable  Post vital signs: Reviewed and stable  Complications: No apparent anesthesia complications

## 2012-03-14 NOTE — ED Provider Notes (Signed)
Medical screening examination/treatment/procedure(s) were conducted as a shared visit with non-physician practitioner(s) and myself.  I personally evaluated the patient during the encounter  Flint Melter, MD 03/14/12 [phone]

## 2012-03-14 NOTE — Progress Notes (Signed)
Dr. Leta Jungling made aware of patient's junctional rhythmn and rates being in the 40s- OK. To go to Telemetry.

## 2012-03-14 NOTE — Progress Notes (Signed)
Patient back to NSR in the 60s, in bed sleeping/resting K. Kirby-NP aware. Will continue to monitor.

## 2012-03-14 NOTE — Progress Notes (Signed)
Patient admitted from the ED to 1404. Alert and oriented, denies pain. Oriented patient/family to unit/room/hospital, scab on left side of head, knee from fall at home unable to assess left lower arm because castalready in place but has +CMS. Sling also in place. Stage 1 on buttocks as well, encouraged Q2hrs turn. F/U with plan of care.

## 2012-03-14 NOTE — Care Management Note (Signed)
    Page 1 of 1   03/16/2012     3:54:32 PM   CARE MANAGEMENT NOTE 03/16/2012  Patient:  [name] [name],[name] [name]   Account Number:  [id number]  Date Initiated:  03/14/2012  Documentation initiated by:  Lanier Clam  Subjective/Objective Assessment:   ADMITTED W/FALL,L HIP PAIN.L WRIST PAIN.AV:WUJW,JXB,JYNW,GNFAOZHY MED HX.     Action/Plan:   FROM INDEP LIV.USED [facility] IN PAST.HAS PCP,PHARMACY,RW.   Anticipated DC Date:  03/16/2012   Anticipated DC Plan:  SKILLED NURSING FACILITY      DC Planning Services  CM consult      Choice offered to / List presented to:             Status of service:  Completed, signed off Medicare Important Message given?   (If response is "NO", the following Medicare IM given date fields will be blank) Date Medicare IM given:   Date Additional Medicare IM given:    Discharge Disposition:  SKILLED NURSING FACILITY  Per UR Regulation:  Reviewed for med. necessity/level of care/duration of stay  If discussed at Long Length of Stay Meetings, dates discussed:   03/15/2012    Comments:  03/16/12 Vandy Fong RN,BSN NCM [phone] D/C SNF.  03/15/12 Juleon Narang RN,BSN NCM [phone] POD#1ORIF L RADIUS,& L HIP PINNING.PT-CIR/SNF.INPT REHAB COORDIN-RECOMMENDED SNF.  03/14/12 Buddie Marston RN,BSN NCM [phone] L WRIST FX CLOSED REDUCTION DONE.FOR L HIP SX TODAY. WILL CONTINUE TO FOLLOW FOR PROGRESS,& D/C NEEDS.

## 2012-03-15 ENCOUNTER — Encounter (HOSPITAL_COMMUNITY): Payer: Self-pay | Admitting: Cardiovascular Disease

## 2012-03-15 DIAGNOSIS — D649 Anemia, unspecified: Secondary | ICD-10-CM

## 2012-03-15 LAB — CBC
Hemoglobin: 9.3 g/dL — ABNORMAL LOW (ref 12.0–15.0)
MCH: 28.8 pg (ref 26.0–34.0)
RBC: 3.23 MIL/uL — ABNORMAL LOW (ref 3.87–5.11)
WBC: 7.5 10*3/uL (ref 4.0–10.5)

## 2012-03-15 LAB — BASIC METABOLIC PANEL
CO2: 32 mEq/L (ref 19–32)
Calcium: 8.6 mg/dL (ref 8.4–10.5)
Chloride: 99 mEq/L (ref 96–112)
Glucose, Bld: 115 mg/dL — ABNORMAL HIGH (ref 70–99)
Sodium: 135 mEq/L (ref 135–145)

## 2012-03-15 LAB — URINE CULTURE: Culture: NO GROWTH

## 2012-03-15 MED ORDER — ASPIRIN 325 MG PO TABS
325.0000 mg | ORAL_TABLET | Freq: Every day | ORAL | Status: DC
  Administered 2012-03-15 – 2012-03-16 (×2): 325 mg via ORAL
  Filled 2012-03-15 (×2): qty 1

## 2012-03-15 MED ORDER — ASPIRIN 325 MG PO TABS: 325.0000 mg | ORAL_TABLET | Freq: Every day | ORAL | Status: DC

## 2012-03-15 MED ORDER — DILTIAZEM HCL ER COATED BEADS 120 MG PO CP24
120.0000 mg | ORAL_CAPSULE | Freq: Every day | ORAL | Status: DC
  Administered 2012-03-15 – 2012-03-16 (×2): 120 mg via ORAL
  Filled 2012-03-15 (×2): qty 1

## 2012-03-15 MED ORDER — HYDROCODONE-ACETAMINOPHEN 5-325 MG PO TABS: 1.0000 | ORAL_TABLET | Freq: Four times a day (QID) | ORAL | Status: DC | PRN

## 2012-03-15 NOTE — Progress Notes (Signed)
Subjective: POD 1 ORIF L DRFx & PP L femoral neck fx tol po. Sitting up. Had a good day--stood with PT, transferred to chair   Objective: Vital signs in last 24 hours: Temp:  [97.7 F (36.5 C)-98.8 F (37.1 C)] 98.8 F (37.1 C) (01/09 1309) Pulse Rate:  [58-93] 62  (01/09 1309) Resp:  [12-20] 16  (01/09 1600) BP: (110-184)/(40-71) 132/42 mmHg (01/09 1309) SpO2:  [92 %-100 %] 99 % (01/09 1309)  Intake/Output from previous day: 01/08 0701 - 01/09 0700 In: 1740 [P.O.:240; I.V.:1400; IV Piggyback:100] Out: 2170 [Urine:2170] Intake/Output this shift: Total I/O In: 360 [P.O.:360] Out: -    Basename 03/15/12 0447 03/14/12 0635 03/13/12 1335  HGB 9.3* 9.5* 11.3*    Basename 03/15/12 0447 03/14/12 0635  WBC 7.5 6.3  RBC 3.23* 3.29*  HCT 29.1* 29.7*  PLT 175 161    Basename 03/15/12 0447 03/14/12 0635  NA 135 137  K 4.3 4.3  CL 99 103  CO2 32 27  BUN 19 22  CREATININE 1.49* 1.72*  GLUCOSE 115* 109*  CALCIUM 8.6 8.6    Basename 03/14/12 0950 03/13/12 1335  LABPT -- --  INR 1.20 2.29*    Neurovascular intact Incision: dressing C/D/I Fingers with reasonably good ROM  Assessment/Plan: 1. VTE with SCDs and ASA since will not be re-starting [name] [name] and has falls risk 2. Pain regimen-no change 3. WB- 25% partial on LLE and full on LUE with platform 4. SNF placement in progress 5. F/u 2 weeks postop with me.   [name] [name] A. 03/15/2012, 6:27 PM

## 2012-03-15 NOTE — Evaluation (Signed)
Occupational Therapy Evaluation Patient Details Name: [name] [name] MRN: [id number] DOB: September 09, 1923 Today's Date: 03/15/2012 Time: 4098-1191 OT Time Calculation (min): 24 min  OT Assessment / Plan / Recommendation Clinical Impression  This 79 year old female sustained a fall at home and sustained L wrist and hip fxs.  She is s/p ORIF of wrist, weight bearing only with platform walker, and cannulated pin on hip with PWB (25%).;  She is appropriate for skilled OT to increase independence with adls to reach a min A level in acute for toileting and most adls.      OT Assessment  Patient needs continued OT Services    Follow Up Recommendations  SNF    Barriers to Discharge      Equipment Recommendations  3 in 1 bedside comode    Recommendations for Other Services    Frequency  Min 2X/week    Precautions / Restrictions Precautions Precautions: Fall Restrictions LUE Weight Bearing: Weight bear through elbow only LLE Weight Bearing: Partial weight bearing LLE Partial Weight Bearing Percentage or Pounds: 25% Other Position/Activity Restrictions: platform walker   Pertinent Vitals/Pain No pain per pt.  Ice reapplied--pt states it felt good    ADL  Upper Body Bathing: Simulated;Minimal assistance Where Assessed - Upper Body Bathing: Unsupported sitting Lower Body Bathing: Simulated;+2 Total assistance Lower Body Bathing: Patient Percentage: 40% Where Assessed - Lower Body Bathing: Supported sit to stand Upper Body Dressing: Simulated;Minimal assistance Where Assessed - Upper Body Dressing: Unsupported sitting Lower Body Dressing: Simulated;+2 Total assistance Lower Body Dressing: Patient Percentage: 10% Where Assessed - Lower Body Dressing: Supported sit to stand Toilet Transfer: Simulated;+2 Total assistance Toilet Transfer: Patient Percentage: 70% Statistician Method: Surveyor, minerals:  (chair to bed) Toileting - Architect and Hygiene:  Simulated;+2 Total assistance Toileting - Architect and Hygiene: Patient Percentage: 0% Where Assessed - Glass blower/designer Manipulation and Hygiene: Sit to stand from 3-in-1 or toilet Equipment Used:  (platform walker) Transfers/Ambulation Related to ADLs: spt to bed; cues for sequence/weight bearing ADL Comments: fx L wrist, in sling.  Encouraged arom of fingers and positioned on pillow for edema management    OT Diagnosis: Generalized weakness  OT Problem List: Decreased strength;Decreased activity tolerance;Decreased knowledge of use of DME or AE;Decreased safety awareness;Decreased knowledge of precautions;Pain;Impaired UE functional use;Increased edema OT Treatment Interventions: Self-care/ADL training;DME and/or AE instruction;Therapeutic activities;Patient/family education;Balance training   OT Goals Acute Rehab OT Goals OT Goal Formulation: With patient Time For Goal Achievement: 03/29/12 Potential to Achieve Goals: Good ADL Goals Pt Will Perform Lower Body Bathing: with min assist;with adaptive equipment;Sit to stand from chair ADL Goal: Lower Body Bathing - Progress: Goal set today Pt Will Transfer to Toilet: with min assist;3-in-1;Stand pivot transfer ADL Goal: Toilet Transfer - Progress: Goal set today Pt Will Perform Toileting - Hygiene: with min assist;Sit to stand from 3-in-1/toilet ADL Goal: Toileting - Hygiene - Progress: Goal set today Miscellaneous OT Goals Miscellaneous OT Goal #1: pt will be independent with edema management or asking for help to position for edema OT Goal: Miscellaneous Goal #1 - Progress: Goal set today  Visit Information  Last OT Received On: 03/15/12 Assistance Needed: +2    Subjective Data  Subjective: I'm not going to eat any more Patient Stated Goal: considering rehab before home   Prior Functioning     Home Living Lives With: Alone Available Help at Discharge: Skilled Nursing Facility Prior Function Level of  Independence: Independent with assistive device(s) Communication Communication:  No difficulties         Vision/Perception     Cognition  Overall Cognitive Status: Appears within functional limits for tasks assessed/performed Arousal/Alertness: Awake/alert Orientation Level: Appears intact for tasks assessed Behavior During Session: [facility] for tasks performed    Extremity/Trunk Assessment Right Upper Extremity Assessment RUE ROM/Strength/Tone: [facility] for tasks assessed Left Upper Extremity Assessment LUE ROM/Strength/Tone: Deficits LUE ROM/Strength/Tone Deficits: pt in sling; able to move shoulder to place arm on platform walker     Mobility Bed Mobility Sit to Supine: 3: Mod assist Transfers Sit to Stand: 1: +2 Total assist Sit to Stand: Patient Percentage: 70% Details for Transfer Assistance: cues for safe technique, sequence     Shoulder Instructions     Exercise     Balance     End of Session OT - End of Session Activity Tolerance: Patient tolerated treatment well Patient left: in bed;with call bell/phone within reach;with family/visitor present  GO     [name] [name] 03/15/2012, 3:17 PM Marica Otter, OTR/L [phone] 03/15/2012

## 2012-03-15 NOTE — Progress Notes (Signed)
Rehab Admissions Coordinator Note:  Patient was screened by Trish Mage for appropriateness for an Inpatient Acute Rehab Consult.  At this time, we are recommending Skilled Nursing Facility.  I did check with [facility] insurance case Production designer, theatre/television/film.  Medical director at [facility] will not approve an inpatient rehab stay for this patient with current diagnosis.  Recommend pursuit of SNF once medically ready for discharge.  Call me for questions.  Trish Mage 03/15/2012, 11:19 AM  I can be reached at [phone].

## 2012-03-15 NOTE — Progress Notes (Signed)
[facility] PROGRESS NOTE  [name] [name] ZOX:[id number] DOB: August 11, 1923 DOA: 03/13/2012 PCP: Carilyn Goodpasture, PA  Assessment/Plan: -Dizziness and fall: Patient recently evaluated as an outpatient with findings consistently with a Vestibular dysfunction. Plan is for outpatient vestibular rehabilitation . After surgery will also ask PT and OT to evaluate and provide treatment recommendations on the patient. Patient will be admitted to telemetry.  -PTalso bradycardic overnight, and amio, cardizem held- will resume amiodarone reamins stable today, holding off cardizem  For now -consult cardiology for further recs -vit B12 &TSH NL, await vit D levels -per cards she will need a cardiac monitor upon d/c 2-Closed left hip fracture:  -per ortho, s/p surgery 1/8 -PT recommending CIR, follow  3-Hyperlipidemia: Continue statins.  4-Atrial fibrillation with brady to 30s: last pm 1/7, and improved after cardizm and amio were held,  the amiodarone was resumed 1/8 and pt's HR remained stable -per cards resuming cardzem as well at decreased dose.  -per cards continue holding off xarelto, to be readdressed prior to d/c 5-CHF, chronic: Diastolic and systolic heart failure with ejection fraction 45-50% and diffuse hypokinesis on last 2-D echo July 2013.  -following close intake and output and also daily weights.  -Continue current medication regimen.  -remains compensated. 6-CAD (coronary artery disease) of artery bypass graft:  -No chest pain. No acute ischemic changes on her EKG. Continue current medication regimen.  7-Acute on chronic renal failure: Most likely secondary to mild dehydration. Will provide gentle hydration and follow renal function. Myoglobinuria secondary to acute fractures might be also playing a role in the patient acute renal failure. At baseline patient creatinine is 1.2-1.4 with a chronic kidney disease stage 2-3.  - mproving -cr 1.49, today 1/9, discussed with Dr Deterding per  daughter's recommends and he requests outpt follow up 8-Anemia: Hemoglobin 11.3 on admission. Patient with history of anemia of chronic disease. We'll continue iron supplementation and follow hemoglobin trend. 2/2 history of coronary artery disease plan is to transfuse the patient hemoglobin less than 8. -hgb  Remains >8 follow 9-Left wrist fracture: Close reduction done by Dr. Janee Morn in the emergency department. -pain managment 10-Gout: Without any acute flare. Will continue allopurinol.      Code Status: full Family Communication: daughter at bedside Disposition Plan: likely snf for rehab   Consultants:  Ortho  Cards  Procedures:  none  Antibiotics:  none  HPI/Subjective: Sitting up in chair, denies any c/o, states pain controlled  Objective: Filed Vitals:   03/15/12 0137 03/15/12 0506 03/15/12 0800 03/15/12 1105  BP: 138/47 110/40    Pulse: 66 58    Temp: 97.8 F (36.6 C) 98.1 F (36.7 C)    TempSrc: Oral Oral    Resp: 20 20    Height:      Weight:      SpO2: 100% 99% 92% 92%    Intake/Output Summary (Last 24 hours) at 03/15/12 1124 Last data filed at 03/15/12 0507  Gross per 24 hour  Intake   1740 ml  Output   2170 ml  Net   -430 ml   Filed Weights   03/13/12 1945  Weight: 69.264 kg (152 lb 11.2 oz)    Exam:   General:  Obese elderly female in NAD  Cardiovascular: regular, nl s1s2  Respiratory: CTAB  Abdomen: soft,+BS ,NT/ND  Extremities:LUE with cast in sling, LE with no edema  Data Reviewed: Basic Metabolic Panel:  Lab 03/15/12 8119 03/14/12 0635 03/13/12 1335  NA 135 137 135  K 4.3  4.3 4.7  CL 99 103 101  CO2 32 27 27  GLUCOSE 115* 109* 152*  BUN 19 22 21   CREATININE 1.49* 1.72* 1.68*  CALCIUM 8.6 8.6 9.3  MG -- -- --  PHOS -- -- --   Liver Function Tests:  Lab 03/14/12 0635  AST --  ALT --  ALKPHOS --  BILITOT --  PROT --  ALBUMIN 2.5*   No results found for this basename: LIPASE:5,AMYLASE:5 in the last 168  hours No results found for this basename: AMMONIA:5 in the last 168 hours CBC:  Lab 03/15/12 0447 03/14/12 0635 03/13/12 1335  WBC 7.5 6.3 5.6  NEUTROABS -- -- --  HGB 9.3* 9.5* 11.3*  HCT 29.1* 29.7* 34.1*  MCV 90.1 90.3 88.3  PLT 175 161 218   Cardiac Enzymes:  Lab 03/14/12 0635 03/14/12 0035 03/13/12 1928  CKTOTAL -- -- --  CKMB -- -- --  CKMBINDEX -- -- --  TROPONINI <0.30 <0.30 <0.30   BNP (last 3 results)  Basename 11/28/11 1353 10/01/11 0718 09/29/11 0008  PROBNP 5095.0* 1563.0* 2283.0*   CBG: No results found for this basename: GLUCAP:5 in the last 168 hours  Recent Results (from the past 240 hour(s))  URINE CULTURE     Status: Normal   Collection Time   03/13/12  6:52 PM      Component Value Range Status Comment   Specimen Description URINE, CATHETERIZED   Final    Special Requests NONE   Final    Culture  Setup Time 03/14/2012 08:37   Final    Colony Count NO GROWTH   Final    Culture NO GROWTH   Final    Report Status 03/15/2012 FINAL   Final   SURGICAL PCR SCREEN     Status: Abnormal   Collection Time   03/14/12  8:36 AM      Component Value Range Status Comment   MRSA, PCR INVALID RESULTS, SPECIMEN SENT FOR CULTURE (*) NEGATIVE Final    Staphylococcus aureus INVALID RESULTS, SPECIMEN SENT FOR CULTURE (*) NEGATIVE Final      Studies: Dg Wrist 2 Views Left  03/13/2012  *RADIOLOGY REPORT*  Clinical Data: Closed reduction, wrist pain with fracture, fell.  LEFT WRIST - 2 VIEW  Comparison: Priors earlier today.  Findings: Comminuted intra-articular distal radius fracture and ulnar styloid avulsion are redemonstrated.  It is difficult to assess for interval improvement because no true lateral film was obtained initially. Slight dorsal angulation noted.  IMPRESSION: Closed reduction as described.   Original Report Authenticated By: Davonna Belling, M.D.    Dg Wrist Complete Left  03/14/2012  *RADIOLOGY REPORT*  Clinical Data: Wrist fracture.  LEFT WRIST - COMPLETE 3+  VIEW  Comparison: 03/13/2012.  Findings: Volar plate screw fixation of the left wrist.  Near anatomic alignment.  Three intraoperative fluoroscopic spot films are submitted.  Ulnar styloid fracture and the visualized.  IMPRESSION: ORIF of the distal left radius.   Original Report Authenticated By: Andreas Newport, M.D.    Dg Wrist Complete Left  03/13/2012  *RADIOLOGY REPORT*  Clinical Data: Fall with left wrist pain and swelling.  LEFT WRIST - COMPLETE 3+ VIEW  Comparison: None  Findings: A comminuted intra-articular fracture of the distal radius is noted with mild apex palmar angulation. An ulnar styloid fracture is also present. Marked degenerative changes at the first carpometacarpal joint are present. Diffuse osteopenia is noted. There is no evidence of subluxation or dislocation.  IMPRESSION: Comminuted intrarticular distal radial fracture  and ulnar styloid fracture.   Original Report Authenticated By: Harmon Pier, M.D.    Dg Hip Complete Left  03/13/2012  *RADIOLOGY REPORT*  Clinical Data: Fall, left hip pain.  LEFT HIP - COMPLETE 2+ VIEW  Comparison: None  Findings: There is a left femoral neck fracture.  Mild valgus angulation.  Slight impaction laterally.  No subluxation or dislocation.  SI joints are symmetric and unremarkable.  Right hip unremarkable.  IMPRESSION: Left femoral neck fracture with mild valgus angulation.   Original Report Authenticated By: Charlett Nose, M.D.    Dg Hip Operative Left  03/14/2012  *RADIOLOGY REPORT*  Clinical Data: Left hip fracture.  OPERATIVE LEFT HIP  Comparison: None.  Findings: Three cannulated left hip screws are demonstrated on AP and lateral fluoroscopic spot views.  No complicating features identified.  IMPRESSION: ORIF left hip.   Original Report Authenticated By: Andreas Newport, M.D.    Ct Head Wo Contrast  03/13/2012  *RADIOLOGY REPORT*  Clinical Data:  Fall  CT HEAD WITHOUT CONTRAST CT CERVICAL SPINE WITHOUT CONTRAST  Technique:  Multidetector CT imaging of  the head and cervical spine was performed following the standard protocol without intravenous contrast.  Multiplanar CT image reconstructions of the cervical spine were also generated.  Comparison:   None  CT HEAD  Findings: Chronic infarct left parietal lobe.  Mild chronic microvascular ischemia in the white matter.  No acute infarct. Negative for hemorrhage or mass.  Negative for skull fracture. Mild chronic sinusitis.  IMPRESSION: Chronic left parietal infarct.  No acute abnormality.  CT CERVICAL SPINE  Findings: Prior left parotidectomy.  Surgical clips around the right carotid from prior carotid endarterectomy.  Negative for fracture.  2 mm anterior slip C3-4.  Mild disc degeneration and spondylosis at C3-4.  Moderate to advanced spondylosis at C4-5 and C5-6.  Moderate spondylosis at C6-7.  Facet degeneration is present especially on the left side.  No mass lesion is identified.  IMPRESSION: Moderate cervical degenerative change.  Negative for fracture.   Original Report Authenticated By: Janeece Riggers, M.D.    Ct Cervical Spine Wo Contrast  03/13/2012  *RADIOLOGY REPORT*  Clinical Data:  Fall  CT HEAD WITHOUT CONTRAST CT CERVICAL SPINE WITHOUT CONTRAST  Technique:  Multidetector CT imaging of the head and cervical spine was performed following the standard protocol without intravenous contrast.  Multiplanar CT image reconstructions of the cervical spine were also generated.  Comparison:   None  CT HEAD  Findings: Chronic infarct left parietal lobe.  Mild chronic microvascular ischemia in the white matter.  No acute infarct. Negative for hemorrhage or mass.  Negative for skull fracture. Mild chronic sinusitis.  IMPRESSION: Chronic left parietal infarct.  No acute abnormality.  CT CERVICAL SPINE  Findings: Prior left parotidectomy.  Surgical clips around the right carotid from prior carotid endarterectomy.  Negative for fracture.  2 mm anterior slip C3-4.  Mild disc degeneration and spondylosis at C3-4.   Moderate to advanced spondylosis at C4-5 and C5-6.  Moderate spondylosis at C6-7.  Facet degeneration is present especially on the left side.  No mass lesion is identified.  IMPRESSION: Moderate cervical degenerative change.  Negative for fracture.   Original Report Authenticated By: Janeece Riggers, M.D.    Dg Chest Portable 1 View  03/13/2012  *RADIOLOGY REPORT*  Clinical Data: Preop evaluation.  History of a fall.  PORTABLE CHEST - 1 VIEW  Comparison: 12/08/2011.  Findings: There is borderline cardiac silhouette enlargement accentuated by AP portable supine technique.  There is mild vascular congestion pattern with slight increased interstitial reticular markings.  There is upper lobe vascular prominence but this is a supine image.  No consolidation or pleural effusion is seen.  Mediastinal and hilar contours appear stable.  Surgical clips are seen in the lower right neck soft tissues. Scoliosis convexity to the left.,  IMPRESSION: Borderline cardiac silhouette enlargement.  Mild vascular congestion pattern with slight increased reticular markings accentuated by supine positioning.  No consolidation or pleural effusion.  No evidence of fracture.   Original Report Authenticated By: Onalee Hua Call    Dg C-arm 1-60 Min-no Report  03/14/2012  CLINICAL DATA: wrist left   C-ARM 1-60 MINUTES  Fluoroscopy was utilized by the requesting physician.  No radiographic  interpretation.      Scheduled Meds:    . allopurinol  100 mg Oral Daily  . amiodarone  100 mg Oral BID  . antiseptic oral rinse  15 mL Mouth Rinse BID  . atorvastatin  40 mg Oral QPM  . budesonide-formoterol  2 puff Inhalation BID  . diltiazem  120 mg Oral Daily  . docusate sodium  100 mg Oral BID  . ferrous fumarate  1 tablet Oral BID  . furosemide  20 mg Oral Daily  . pantoprazole  40 mg Oral Daily  . raloxifene  60 mg Oral Daily   Continuous Infusions:   Principal Problem:  *Closed left hip fracture Active Problems:  Hyperlipidemia   Atrial fibrillation  Diastolic CHF, chronic  CAD (coronary artery disease) of artery bypass graft  Acute on chronic renal failure  Anemia  Left wrist fracture  Gout    Time spent: >30    [facility]  [facility] Pager [phone]. If 8PM-8AM, please contact night-coverage at [email or web address], password [facility] 03/15/2012, 11:24 AM  LOS: 2 days

## 2012-03-15 NOTE — Evaluation (Signed)
Physical Therapy Evaluation Patient Details Name: [name] [name] MRN: [id number] DOB: March 31, 1923 Today's Date: 03/15/2012 Time: 3086-5784 PT Time Calculation (min): 19 min  PT Assessment / Plan / Recommendation Clinical Impression  Pt s/p ORIF wrist and cannulated hip pinning after sustaining fx from fall at home. Pt would benefit from acute PT services in order to improve independence with transfers and ambulation to prepare for d/c to next venue.      PT Assessment  Patient needs continued PT services    Follow Up Recommendations  CIR;Supervision/Assistance - 24 hour (if not CIR then SNF)    Does the patient have the potential to tolerate intense rehabilitation      Barriers to Discharge        Equipment Recommendations  Rolling walker with 5" wheels;Other (comment) (with L platform)    Recommendations for Other Services     Frequency Min 3X/week    Precautions / Restrictions Precautions Precautions: Fall Restrictions Weight Bearing Restrictions: Yes LUE Weight Bearing: Weight bear through elbow only LLE Weight Bearing: Partial weight bearing LLE Partial Weight Bearing Percentage or Pounds: 25%   Pertinent Vitals/Pain Pt denies pain, repositioned in recliner      Mobility  Bed Mobility Bed Mobility: Supine to Sit Supine to Sit: 4: Min assist;HOB elevated;With rails Details for Bed Mobility Assistance: verbal cues for technique, assist for trunk Transfers Transfers: Stand to Sit;Sit to Stand Sit to Stand: 1: +2 Total assist Sit to Stand: Patient Percentage: 60% Stand to Sit: 1: +2 Total assist Stand to Sit: Patient Percentage: 60% Details for Transfer Assistance: verbal cues for safe technique and WB status, assist to rise and control descent, pt did not reach back for armrest Ambulation/Gait Ambulation/Gait Assistance: 1: +2 Total assist Ambulation/Gait: Patient Percentage: 60% Ambulation Distance (Feet): 3 Feet Assistive device: Left platform  walker Ambulation/Gait Assistance Details: pt able to take multiple small steps to recliner Gait Pattern: Step-to pattern;Antalgic    Shoulder Instructions     Exercises     PT Diagnosis: Difficulty walking  PT Problem List: Decreased strength;Decreased activity tolerance;Decreased mobility;Decreased knowledge of precautions;Decreased knowledge of use of DME PT Treatment Interventions: DME instruction;Gait training;Functional mobility training;Therapeutic activities;Patient/family education;Therapeutic exercise   PT Goals Acute Rehab PT Goals PT Goal Formulation: With patient Time For Goal Achievement: 03/29/12 Potential to Achieve Goals: Good Pt will go Supine/Side to Sit: with supervision PT Goal: Supine/Side to Sit - Progress: Goal set today Pt will go Sit to Supine/Side: with supervision PT Goal: Sit to Supine/Side - Progress: Goal set today Pt will go Sit to Stand: with supervision PT Goal: Sit to Stand - Progress: Goal set today Pt will go Stand to Sit: with supervision PT Goal: Stand to Sit - Progress: Goal set today Pt will Ambulate: 16 - 50 feet;with supervision;with least restrictive assistive device PT Goal: Ambulate - Progress: Goal set today  Visit Information  Last PT Received On: 03/15/12 Assistance Needed: +2    Subjective Data  Subjective: I'm not hurting. Patient Stated Goal: agreeable to SNF   Prior Functioning  Home Living Lives With: Alone Available Help at Discharge: Skilled Nursing Facility Home Adaptive Equipment: Walker - rolling Additional Comments: pt reports using oxgyen at night Prior Function Level of Independence: Independent with assistive device(s) Communication Communication: No difficulties    Cognition  Overall Cognitive Status: Appears within functional limits for tasks assessed/performed Arousal/Alertness: Awake/alert Orientation Level: Appears intact for tasks assessed Behavior During Session: [facility] for tasks performed     Extremity/Trunk  Assessment Right Upper Extremity Assessment RUE ROM/Strength/Tone: [facility] for tasks assessed Left Upper Extremity Assessment LUE ROM/Strength/Tone: Deficits LUE ROM/Strength/Tone Deficits: casted wrist, NWB through wrist, allowed through elbow, shoulder WFL for tasks assessed Right Lower Extremity Assessment RLE ROM/Strength/Tone: [facility] for tasks assessed Left Lower Extremity Assessment LLE ROM/Strength/Tone: Deficits LLE ROM/Strength/Tone Deficits: grossly at least 2+/5 as pt able to move against gravity without assist however did not MMT due to recent surgery   Balance    End of Session PT - End of Session Equipment Utilized During Treatment: Gait belt Activity Tolerance: Patient limited by fatigue Patient left: in chair;with call bell/phone within reach;with family/visitor present Nurse Communication: Mobility status;Other (comment) (+2 for safety)  GP     Hayes Czaja,KATHrine E 03/15/2012, 10:35 AM Pager: [phone]

## 2012-03-16 DIAGNOSIS — I25810 Atherosclerosis of coronary artery bypass graft(s) without angina pectoris: Secondary | ICD-10-CM

## 2012-03-16 LAB — CBC
HCT: 26.9 % — ABNORMAL LOW (ref 36.0–46.0)
Hemoglobin: 8.6 g/dL — ABNORMAL LOW (ref 12.0–15.0)
MCH: 28.8 pg (ref 26.0–34.0)
MCV: 90 fL (ref 78.0–100.0)
RBC: 2.99 MIL/uL — ABNORMAL LOW (ref 3.87–5.11)

## 2012-03-16 LAB — MRSA CULTURE

## 2012-03-16 LAB — BASIC METABOLIC PANEL
CO2: 30 mEq/L (ref 19–32)
Calcium: 8.7 mg/dL (ref 8.4–10.5)
Creatinine, Ser: 1.5 mg/dL — ABNORMAL HIGH (ref 0.50–1.10)
Glucose, Bld: 90 mg/dL (ref 70–99)

## 2012-03-16 MED ORDER — POLYETHYLENE GLYCOL 3350 17 G PO PACK: 17.0000 g | PACK | Freq: Every day | ORAL | Status: DC

## 2012-03-16 MED ORDER — DILTIAZEM HCL ER COATED BEADS 120 MG PO CP24: 120.0000 mg | ORAL_CAPSULE | Freq: Every day | ORAL | Status: DC

## 2012-03-16 MED ORDER — ONDANSETRON HCL 4 MG PO TABS: 4.0000 mg | ORAL_TABLET | Freq: Four times a day (QID) | ORAL | Status: DC | PRN

## 2012-03-16 MED ORDER — BISACODYL 5 MG PO TBEC: 10.0000 mg | DELAYED_RELEASE_TABLET | Freq: Every day | ORAL | Status: DC | PRN

## 2012-03-16 NOTE — Discharge Summary (Addendum)
Physician Discharge Summary  [name] [name] WUJ:[id number] DOB: 22-Nov-1923 DOA: 03/13/2012  PCP: Carilyn Goodpasture, PA  Admit date: 03/13/2012 Discharge date: 03/16/2012  Time spent: >30 minutes  Recommendations for Outpatient Follow-up:         Follow-up Information    Follow up with THOMPSON, DAVID A., MD. Schedule an appointment as soon as possible for a visit today. (10-15 days following surgery)    Contact information:   [address] [place] [address] [phone]       Follow up with MCALHANY,CHRISTOPHER, MD. (in 1-2weeks, call for appt upon discharge)    Contact information:   [address] [place] [address] [phone]       Please follow up. (SNF MD in 1-2days)        [facility]: Call for appointment upon discharge.  Discharge Diagnoses:  Principal Problem:  *Closed left hip fracture Active Problems:  Hyperlipidemia  Atrial fibrillation  Diastolic CHF, chronic  CAD (coronary artery disease) of artery bypass graft  Acute on chronic renal failure  Anemia  Left wrist fracture  Gout   Discharge Condition: improved/stable  Diet recommendation: heart healthy  Filed Weights   03/13/12 1945  Weight: 69.264 kg (152 lb 11.2 oz)    History of present illness:  [name] [name] is a 79 y.o. female 79 year old female with a history of vertigo, hypertension, COPD, hyperlipidemia, GERD, coronary artery disease and A. Fib; presents to the emergency department via EMS with her daughter complaining of left wrist pain and left hip pain after falling earlier today. According to daughter patient got dizzy and fell. She did hit her head but never lose Consciousness. Denies headache, neck pain or back pain.  Daughter states she has been getting dizzy frequently, her primary care physician worked her up for vertigo. She is supposed to begin physical therapy for vertigo at a vestibular rehabilitation next week. Patient denies any associated  symptoms or prodromic abnormalities prior to her fall including chest pain, sob, confusion, visual disturbance, nausea, vomiting, fever, chills, melena or any other acute complaints.  It was noted that , patient is chronically on xarelto for her atrial fibrillation   Hospital Course:  -Dizziness and fall:  It was noted on admission the patient had recently been evaluated as an outpatient with findings consistently with a Vestibular dysfunction. Plan is for outpatient vestibular rehabilitation . She is to follow up outpatient for vestibular PT after she is dc'ed from SNF. -She was monitored on telemetry while in the hospital and bradycardic episode down to the 30s -40s was noted and amio, cardizem held- and with improvement of her heart rate the amiodarone was resumed and are her heart rate remained stable. Cardiology was consulted and they saw patient and  resumed the Cardizem at a decreased dose-120 mg (from 240 mg on admission ) cardiology also recommended that she would need a cardiac monitor upon discharge, and the card masterwas called and will have this set up. The impression was that this bradycardia could have been a echo treating factor to her dizziness/also as well. -vit B12 &TSH was done and came back within normal limits. She is to follow up outpatient with cardiology 2-Closed left hip fracture:  -As discussed above orthopedics was consulted on admission and this saw the patient and she was taken into OR on 1/8. Discussed outpatient with Dr. Janee Morn with it is okay to resume her aspirin and states are she does not recommend any post hip  prophylaxis and at this time due to her fall risk. She is to followup up outpatient with ortho in 2 weeks. 3-Hyperlipidemia: Continue statins.  4-Atrial fibrillation with brady to 30s:  -Please see #1 above, she is to continue amiodarone upon discharge, the Cardizem at a decreased dose of120 mg daily as discussed above. Discussed with Dr. Clifton James  recommends not to resume xarelto at this time due to a fall risk and, in a states she is to follow up outpatient and this will be reassessed. He recommends discharging her on aspirin. 5-CHF, chronic: Diastolic and systolic heart failure with ejection fraction 45-50% and diffuse hypokinesis on last 2-D echo July 2013.  --remained compensated during her hospital stay, she is to continue her outpatient medications upon discharge.Marland Kitchen  6-CAD (coronary artery disease) of artery bypass graft:  -No chest pain. No acute ischemic changes on her EKG. Continue current medication regimen.  7-Acute on chronic renal failure: Most likely secondary to mild dehydration. Will provide gentle hydration and follow renal function. Myoglobinuria secondary to acute fractures might be also playing a role in the patient acute renal failure. At baseline patient creatinine is 1.2-1.4 with a chronic kidney disease stage 2-3.  -Her creatinine today prior to discharge is 1.5, she is to followup outpatient as previously planned. 8-Anemia: Hemoglobin 11.3 on admission. Patient with history of anemia of chronic disease. We'll continue iron supplementation and follow hemoglobin trend. 2/2 history of coronary artery disease plan is to transfuse the patient hemoglobin less than 8.  -Her hemoglobin today prior to discharge is 8.6 postoperatively are, she is had no gross evidence of bleeding and is to follow up outpatient. 9-Left wrist fracture: Close reduction done by Dr. Janee Morn in the emergency department. She received pain management in the hospital and also surgical repair  per Dr. Janee Morn as above.   10-Gout: Without any acute flare. Will continue allopurinol.       Procedures:  Status post ORIF left distal radius fracture and percutaneous pinning of left femoral neck fracture  Consultations:  Orthopedics  cardiology  Discharge Exam: Filed Vitals:   03/16/12 0400 03/16/12 0720 03/16/12 0754 03/16/12 0800  BP:  147/50      Pulse:  63    Temp:  98.3 F (36.8 C)    TempSrc:  Oral    Resp: 14 16  16   Height:      Weight:      SpO2: 16% 99% 99% 100%   Exam:  General: Obese elderly female in NAD  Cardiovascular: regular, nl s1s2  Respiratory: CTAB  Abdomen: soft,+BS ,NT/ND  Extremities:LUE with cast in sling, LE with no edema     Discharge Instructions  Discharge Orders    Future Appointments: Provider: Department: Dept Phone: Center:   05/04/2012 12:15 PM Kathleene Hazel, MD [facility] Main Office [place]) [phone] [facility]     Future Orders Please Complete By Expires   Diet - low sodium heart healthy      Partial weight bearing      Comments:   25% PWB on LLE, may bear weight on LUE with platform device   Increase activity slowly          Medication List     As of 03/16/2012 12:32 PM    STOP taking these medications         Rivaroxaban 15 MG Tabs tablet   Commonly known as: XARELTO      TAKE these medications  allopurinol 100 MG tablet   Commonly known as: ZYLOPRIM   Take 100 mg by mouth daily.      amiodarone 200 MG tablet   Commonly known as: PACERONE   Take 100 mg by mouth 2 (two) times daily.      aspirin 325 MG tablet   Take 1 tablet (325 mg total) by mouth daily.      atorvastatin 40 MG tablet   Commonly known as: LIPITOR   Take 40 mg by mouth every evening.      bisacodyl 5 MG EC tablet   Commonly known as: DULCOLAX   Take 2 tablets (10 mg total) by mouth daily as needed for constipation.      budesonide-formoterol 160-4.5 MCG/ACT inhaler   Commonly known as: SYMBICORT   Inhale 2 puffs into the lungs 2 (two) times daily.      diltiazem 120 MG 24 hr capsule   Commonly known as: CARDIZEM CD   Take 1 capsule (120 mg total) by mouth daily.      ferrous fumarate 325 (106 FE) MG Tabs   Commonly known as: HEMOCYTE - 106 mg FE   Take 1 tablet by mouth 2 (two) times daily.      fish oil-omega-3 fatty acids 1000 MG capsule   Take 1 g  by mouth 2 (two) times daily.      FLAX SEED OIL PO   Take 1 capsule by mouth 2 (two) times daily.      furosemide 40 MG tablet   Commonly known as: LASIX   Take 20 mg by mouth daily.      HYDROcodone-acetaminophen 5-325 MG per tablet   Commonly known as: NORCO/VICODIN   Take 1-2 tablets by mouth every 6 (six) hours as needed.      levalbuterol 45 MCG/ACT inhaler   Commonly known as: XOPENEX HFA   Inhale 2 puffs into the lungs 2 (two) times daily.      levalbuterol 45 MCG/ACT inhaler   Commonly known as: XOPENEX HFA   Inhale 1-2 puffs into the lungs every 4 (four) hours as needed. Wheezing      omeprazole 20 MG capsule   Commonly known as: PRILOSEC   Take 20 mg by mouth daily.      ondansetron 4 MG tablet   Commonly known as: ZOFRAN   Take 1 tablet (4 mg total) by mouth every 6 (six) hours as needed for nausea.      polyethylene glycol packet   Commonly known as: MIRALAX / GLYCOLAX   Take 17 g by mouth daily.      raloxifene 60 MG tablet   Commonly known as: EVISTA   Take 60 mg by mouth daily.      saccharomyces boulardii 250 MG capsule   Commonly known as: FLORASTOR   Take 250 mg by mouth 2 (two) times daily.           Follow-up Information    Follow up with THOMPSON, DAVID A., MD. Schedule an appointment as soon as possible for a visit today. (10-15 days following surgery)    Contact information:   [address] [place] [address] [phone]       Follow up with MCALHANY,CHRISTOPHER, MD. (in 1-2weeks, call for appt upon discharge)    Contact information:   [address] [place] [address] [phone]       Please follow up. (SNF MD in 1-2days)  The results of significant diagnostics from this hospitalization (including imaging, microbiology, ancillary and laboratory) are listed below for reference.    Significant Diagnostic Studies: Dg Wrist 2 Views Left  03/13/2012  *RADIOLOGY REPORT*  Clinical Data:  Closed reduction, wrist pain with fracture, fell.  LEFT WRIST - 2 VIEW  Comparison: Priors earlier today.  Findings: Comminuted intra-articular distal radius fracture and ulnar styloid avulsion are redemonstrated.  It is difficult to assess for interval improvement because no true lateral film was obtained initially. Slight dorsal angulation noted.  IMPRESSION: Closed reduction as described.   Original Report Authenticated By: Davonna Belling, M.D.    Dg Wrist Complete Left  03/14/2012  *RADIOLOGY REPORT*  Clinical Data: Wrist fracture.  LEFT WRIST - COMPLETE 3+ VIEW  Comparison: 03/13/2012.  Findings: Volar plate screw fixation of the left wrist.  Near anatomic alignment.  Three intraoperative fluoroscopic spot films are submitted.  Ulnar styloid fracture and the visualized.  IMPRESSION: ORIF of the distal left radius.   Original Report Authenticated By: Andreas Newport, M.D.    Dg Wrist Complete Left  03/13/2012  *RADIOLOGY REPORT*  Clinical Data: Fall with left wrist pain and swelling.  LEFT WRIST - COMPLETE 3+ VIEW  Comparison: None  Findings: A comminuted intra-articular fracture of the distal radius is noted with mild apex palmar angulation. An ulnar styloid fracture is also present. Marked degenerative changes at the first carpometacarpal joint are present. Diffuse osteopenia is noted. There is no evidence of subluxation or dislocation.  IMPRESSION: Comminuted intrarticular distal radial fracture and ulnar styloid fracture.   Original Report Authenticated By: Harmon Pier, M.D.    Dg Hip Complete Left  03/13/2012  *RADIOLOGY REPORT*  Clinical Data: Fall, left hip pain.  LEFT HIP - COMPLETE 2+ VIEW  Comparison: None  Findings: There is a left femoral neck fracture.  Mild valgus angulation.  Slight impaction laterally.  No subluxation or dislocation.  SI joints are symmetric and unremarkable.  Right hip unremarkable.  IMPRESSION: Left femoral neck fracture with mild valgus angulation.   Original Report  Authenticated By: Charlett Nose, M.D.    Dg Hip Operative Left  03/14/2012  *RADIOLOGY REPORT*  Clinical Data: Left hip fracture.  OPERATIVE LEFT HIP  Comparison: None.  Findings: Three cannulated left hip screws are demonstrated on AP and lateral fluoroscopic spot views.  No complicating features identified.  IMPRESSION: ORIF left hip.   Original Report Authenticated By: Andreas Newport, M.D.    Ct Head Wo Contrast  03/13/2012  *RADIOLOGY REPORT*  Clinical Data:  Fall  CT HEAD WITHOUT CONTRAST CT CERVICAL SPINE WITHOUT CONTRAST  Technique:  Multidetector CT imaging of the head and cervical spine was performed following the standard protocol without intravenous contrast.  Multiplanar CT image reconstructions of the cervical spine were also generated.  Comparison:   None  CT HEAD  Findings: Chronic infarct left parietal lobe.  Mild chronic microvascular ischemia in the white matter.  No acute infarct. Negative for hemorrhage or mass.  Negative for skull fracture. Mild chronic sinusitis.  IMPRESSION: Chronic left parietal infarct.  No acute abnormality.  CT CERVICAL SPINE  Findings: Prior left parotidectomy.  Surgical clips around the right carotid from prior carotid endarterectomy.  Negative for fracture.  2 mm anterior slip C3-4.  Mild disc degeneration and spondylosis at C3-4.  Moderate to advanced spondylosis at C4-5 and C5-6.  Moderate spondylosis at C6-7.  Facet degeneration is present especially on the left side.  No mass lesion is identified.  IMPRESSION:  Moderate cervical degenerative change.  Negative for fracture.   Original Report Authenticated By: Janeece Riggers, M.D.    Ct Cervical Spine Wo Contrast  03/13/2012  *RADIOLOGY REPORT*  Clinical Data:  Fall  CT HEAD WITHOUT CONTRAST CT CERVICAL SPINE WITHOUT CONTRAST  Technique:  Multidetector CT imaging of the head and cervical spine was performed following the standard protocol without intravenous contrast.  Multiplanar CT image reconstructions of the  cervical spine were also generated.  Comparison:   None  CT HEAD  Findings: Chronic infarct left parietal lobe.  Mild chronic microvascular ischemia in the white matter.  No acute infarct. Negative for hemorrhage or mass.  Negative for skull fracture. Mild chronic sinusitis.  IMPRESSION: Chronic left parietal infarct.  No acute abnormality.  CT CERVICAL SPINE  Findings: Prior left parotidectomy.  Surgical clips around the right carotid from prior carotid endarterectomy.  Negative for fracture.  2 mm anterior slip C3-4.  Mild disc degeneration and spondylosis at C3-4.  Moderate to advanced spondylosis at C4-5 and C5-6.  Moderate spondylosis at C6-7.  Facet degeneration is present especially on the left side.  No mass lesion is identified.  IMPRESSION: Moderate cervical degenerative change.  Negative for fracture.   Original Report Authenticated By: Janeece Riggers, M.D.    Dg Chest Portable 1 View  03/13/2012  *RADIOLOGY REPORT*  Clinical Data: Preop evaluation.  History of a fall.  PORTABLE CHEST - 1 VIEW  Comparison: 12/08/2011.  Findings: There is borderline cardiac silhouette enlargement accentuated by AP portable supine technique.  There is mild vascular congestion pattern with slight increased interstitial reticular markings.  There is upper lobe vascular prominence but this is a supine image.  No consolidation or pleural effusion is seen.  Mediastinal and hilar contours appear stable.  Surgical clips are seen in the lower right neck soft tissues. Scoliosis convexity to the left.,  IMPRESSION: Borderline cardiac silhouette enlargement.  Mild vascular congestion pattern with slight increased reticular markings accentuated by supine positioning.  No consolidation or pleural effusion.  No evidence of fracture.   Original Report Authenticated By: Onalee Hua Call    Dg C-arm 1-60 Min-no Report  03/14/2012  CLINICAL DATA: wrist left   C-ARM 1-60 MINUTES  Fluoroscopy was utilized by the requesting physician.  No  radiographic  interpretation.      Microbiology: Recent Results (from the past 240 hour(s))  URINE CULTURE     Status: Normal   Collection Time   03/13/12  6:52 PM      Component Value Range Status Comment   Specimen Description URINE, CATHETERIZED   Final    Special Requests NONE   Final    Culture  Setup Time 03/14/2012 08:37   Final    Colony Count NO GROWTH   Final    Culture NO GROWTH   Final    Report Status 03/15/2012 FINAL   Final   SURGICAL PCR SCREEN     Status: Abnormal   Collection Time   03/14/12  8:36 AM      Component Value Range Status Comment   MRSA, PCR INVALID RESULTS, SPECIMEN SENT FOR CULTURE (*) NEGATIVE Final    Staphylococcus aureus INVALID RESULTS, SPECIMEN SENT FOR CULTURE (*) NEGATIVE Final   MRSA CULTURE     Status: Normal (Preliminary result)   Collection Time   03/14/12  8:36 AM      Component Value Range Status Comment   Specimen Description NOSE   Final    Special Requests NONE  Final    Culture NO SUSPICIOUS COLONIES, CONTINUING TO HOLD   Final    Report Status PENDING   Incomplete      Labs: Basic Metabolic Panel:  Lab 03/16/12 4782 03/15/12 0447 03/14/12 0635 03/13/12 1335  NA 132* 135 137 135  K 4.0 4.3 4.3 4.7  CL 96 99 103 101  CO2 30 32 27 27  GLUCOSE 90 115* 109* 152*  BUN 21 19 22 21   CREATININE 1.50* 1.49* 1.72* 1.68*  CALCIUM 8.7 8.6 8.6 9.3  MG -- -- -- --  PHOS -- -- -- --   Liver Function Tests:  Lab 03/14/12 0635  AST --  ALT --  ALKPHOS --  BILITOT --  PROT --  ALBUMIN 2.5*   No results found for this basename: LIPASE:5,AMYLASE:5 in the last 168 hours No results found for this basename: AMMONIA:5 in the last 168 hours CBC:  Lab 03/16/12 0535 03/15/12 0447 03/14/12 0635 03/13/12 1335  WBC 6.3 7.5 6.3 5.6  NEUTROABS -- -- -- --  HGB 8.6* 9.3* 9.5* 11.3*  HCT 26.9* 29.1* 29.7* 34.1*  MCV 90.0 90.1 90.3 88.3  PLT 165 175 161 218   Cardiac Enzymes:  Lab 03/14/12 0635 03/14/12 0035 03/13/12 1928  CKTOTAL -- --  --  CKMB -- -- --  CKMBINDEX -- -- --  TROPONINI <0.30 <0.30 <0.30   BNP: BNP (last 3 results)  Basename 11/28/11 1353 10/01/11 0718 09/29/11 0008  PROBNP 5095.0* 1563.0* 2283.0*   CBG: No results found for this basename: GLUCAP:5 in the last 168 hours     Signed:  Shantinique Picazo C  [facility] 03/16/2012, 12:32 PM

## 2012-03-16 NOTE — Progress Notes (Signed)
CSW met with patient. Patient denied by CIR. Patient and family agreeable to snf. Requesting 1. [facility], 2. [facility].  Cathryn Gallery C. Zasha Belleau MSW, LCSW [phone]

## 2012-03-16 NOTE — Progress Notes (Signed)
Placed pt on Venti mask at 28% due to pt desating while sleeping. Will continue to monitor.

## 2012-03-16 NOTE — Progress Notes (Signed)
Physical Therapy Treatment Patient Details Name: [name] [name] MRN: [id number] DOB: 03/15/1923 Today's Date: 03/16/2012 Time: 4098-1191 PT Time Calculation (min): 26 min  PT Assessment / Plan / Recommendation Comments on Treatment Session  Pt assisted to recliner and doing well with mobility.  Pt reports she has not had much pain during hospitalization.  Performed exercises in recliner as well.  Pt plans to d/c to SNF.    Follow Up Recommendations  SNF;Supervision/Assistance - 24 hour     Does the patient have the potential to tolerate intense rehabilitation     Barriers to Discharge        Equipment Recommendations  Rolling walker with 5" wheels;Other (comment) (with L platform)    Recommendations for Other Services    Frequency     Plan Discharge plan remains appropriate;Frequency remains appropriate    Precautions / Restrictions Precautions Precautions: Fall Restrictions LUE Weight Bearing: Weight bear through elbow only LLE Weight Bearing: Partial weight bearing LLE Partial Weight Bearing Percentage or Pounds: 25% Other Position/Activity Restrictions: platform walker   Pertinent Vitals/Pain Pt reports no pain, repositioned to comfort in recliner    Mobility  Bed Mobility Bed Mobility: Supine to Sit Supine to Sit: 4: Min assist;HOB elevated Details for Bed Mobility Assistance: verbal cues for technique, assist for trunk Transfers Transfers: Stand to Sit;Sit to Stand;Stand Pivot Transfers Sit to Stand: 3: Mod assist;With upper extremity assist Stand to Sit: 3: Mod assist;Without upper extremity assist Stand Pivot Transfers: 3: Mod assist;Other (comment) (platform RW) Details for Transfer Assistance: verbal cues for safe technique and WB, pt unable to reach back to sit down so assisted controlling descent, increased verbal cues for PWB 25% during pivot    Exercises General Exercises - Lower Extremity Ankle Circles/Pumps: AROM;Both;15 reps;Supine Short Arc Quad:  AROM;Strengthening;Left;Supine;20 reps Heel Slides: AROM;Strengthening;Left;15 reps;Supine Hip ABduction/ADduction: AROM;Strengthening;Left;15 reps;Supine Straight Leg Raises: AAROM;Strengthening;Left;10 reps;Supine   PT Diagnosis:    PT Problem List:   PT Treatment Interventions:     PT Goals Acute Rehab PT Goals PT Goal: Supine/Side to Sit - Progress: Progressing toward goal PT Goal: Sit to Stand - Progress: Progressing toward goal PT Goal: Stand to Sit - Progress: Progressing toward goal  Visit Information  Last PT Received On: 03/16/12 Assistance Needed: +2 (if ambulation)    Subjective Data  Subjective: I haven't used the bathroom yet.   Cognition  Overall Cognitive Status: Appears within functional limits for tasks assessed/performed    Balance     End of Session PT - End of Session Equipment Utilized During Treatment: Gait belt Activity Tolerance: Patient tolerated treatment well Patient left: in chair;with call bell/phone within reach;with family/visitor present   GP     [name] [name],KATHrine E 03/16/2012, 11:57 AM Pager: [phone]

## 2012-03-16 NOTE — Progress Notes (Signed)
SUBJECTIVE: No complaints this am. No chest pain or SOB. She was out of bed yesterday.   Tele: NSR. No bradycardia on telemetry.   BP 134/47  Pulse 64  Temp 98.7 F (37.1 C) (Oral)  Resp 14  Ht 5\' 2"  (1.575 m)  Wt 152 lb 11.2 oz (69.264 kg)  BMI 27.93 kg/m2  SpO2 16%  Intake/Output Summary (Last 24 hours) at 03/16/12 0555 Last data filed at 03/16/12 0548  Gross per 24 hour  Intake   1080 ml  Output    800 ml  Net    280 ml    PHYSICAL EXAM General: Well developed, well nourished, in no acute distress. Alert and oriented x 3.  Psych:  Good affect, responds appropriately Neck: No JVD. No masses noted.  Lungs: Clear bilaterally with no wheezes or rhonci noted.  Heart: RRR with systolic murmur noted. Abdomen: Bowel sounds are present. Soft, non-tender.  Extremities: No lower extremity edema.   LABS: Basic Metabolic Panel:  Basename 03/15/12 0447 03/14/12 0635  NA 135 137  K 4.3 4.3  CL 99 103  CO2 32 27  GLUCOSE 115* 109*  BUN 19 22  CREATININE 1.49* 1.72*  CALCIUM 8.6 8.6  MG -- --  PHOS -- --   CBC:  Basename 03/16/12 0535 03/15/12 0447  WBC 6.3 7.5  NEUTROABS -- --  HGB 8.6* 9.3*  HCT 26.9* 29.1*  MCV 90.0 90.1  PLT 165 175   Cardiac Enzymes:  Basename 03/14/12 0635 03/14/12 0035 03/13/12 1928  CKTOTAL -- -- --  CKMB -- -- --  CKMBINDEX -- -- --  TROPONINI <0.30 <0.30 <0.30   Fasting Lipid Panel:  Basename 03/14/12 0635  CHOL 176  HDL 76  LDLCALC 81  TRIG 94  CHOLHDL 2.3  LDLDIRECT --    Current Meds:    . allopurinol  100 mg Oral Daily  . amiodarone  100 mg Oral BID  . antiseptic oral rinse  15 mL Mouth Rinse BID  . aspirin  325 mg Oral Daily  . atorvastatin  40 mg Oral QPM  . budesonide-formoterol  2 puff Inhalation BID  . diltiazem  120 mg Oral Daily  . docusate sodium  100 mg Oral BID  . ferrous fumarate  1 tablet Oral BID  . furosemide  20 mg Oral Daily  . pantoprazole  40 mg Oral Daily  . raloxifene  60 mg Oral Daily       ASSESSMENT AND PLAN:   1. Atrial fibrillation: Maintaining sinus rhythm. Currently sinus with 1st degree AV block. She has been anti-coagulated with Xarelto at home. This has been held following her fall. Will not likely resume anti-coagulation given her age and now documented instability with fall. This will be readdressed before discharge. She has been rate controlled with Cardizem and amiodarone and doing well with this regimen at home although it is unclear if she is having bradycardia at home causing some of her dizziness. Episode of junctional bradycardia 03/14/11 while sleeping but none since. Would continue amiodarone for now and continue lower dose of Cardizem (decreased from 240 mg to 20 mg po Qdaily). Will follow rates on telemetry. She will need a monitor arranged at time of discharge to follow heart rates at home.   2. Chronic Diastolic CHF: Weight stable. Last LVEF 45-50% July 2013.   3. Coronary Artery Disease: Stable. No angina. She should be started on an ASA if her Xarelto is not restarted. Start ASA when ok  from surgical perspective.   4. Aortic valve stenosis: Mild to moderate by echo July 2013.   5. Left hip fracture/Left wrist fracture: Surgical repair per ortho    MCALHANY,CHRISTOPHER  1/10/20145:55 AM

## 2012-03-16 NOTE — Progress Notes (Signed)
Changed pt back to nasal canala because she felt like she was short of breath. Increase Silex to 3 LPM

## 2012-03-16 NOTE — Progress Notes (Signed)
Report called to [facility]. Spoke with Receiving nurse. Patient is stable and ready for discharge will be leaving by transport. Erskin Burnet RN

## 2012-03-17 LAB — VITAMIN D 1,25 DIHYDROXY
Vitamin D2 1, 25 (OH)2: <8 pg/mL
Vitamin D3 1, 25 (OH)2: 37 pg/mL

## 2012-03-18 LAB — TYPE AND SCREEN
DAT, IgG: NEGATIVE
Unit division: 0

## 2012-03-19 ENCOUNTER — Telehealth: Payer: Self-pay | Admitting: Cardiovascular Disease

## 2012-03-19 NOTE — Telephone Encounter (Signed)
They where wondering is Dr. Clifton James still needs pt to get monitor to wear and the daughter wants to go over the medication list to make sure it is right

## 2012-03-19 NOTE — Telephone Encounter (Signed)
I spoke with pt's daughter and gave her this information and reviewed med list with her. Post hospital appt set up with Tereso Newcomer ,PA for March 28, 2012.  Daughter asked me to contact [place] at [facility] [phone] schedule monitor appt.

## 2012-03-19 NOTE — Telephone Encounter (Signed)
Chart reviewed and pt is to have 21 day event monitor. Request has been sent to scheduling to contact pt with appt time.

## 2012-03-20 NOTE — Clinical Social Work Placement (Signed)
     Clinical Social Work Department CLINICAL SOCIAL WORK PLACEMENT NOTE 03/20/2012  Patient:  [name],[name] F  Account Number:  [id number] Admit date:  03/13/2012  Clinical Social Worker:  Becky Sax, LCSW  Date/time:  03/16/2012 12:00 M  Clinical Social Work is seeking post-discharge placement for this patient at the following level of care:   SKILLED NURSING   (*CSW will update this form in Epic as items are completed)   03/16/2012  Patient/family provided with [facility] Department of Clinical Social Works list of facilities offering this level of care within the geographic area requested by the patient (or if unable, by the patients family).  03/16/2012  Patient/family informed of their freedom to choose among providers that offer the needed level of care, that participate in Medicare, Medicaid or managed care program needed by the patient, have an available bed and are willing to accept the patient.  03/16/2012  Patient/family informed of [facility] ownership interest in [facility], as well as of the fact that they are under no obligation to receive care at this facility.  PASARR submitted to EDS on 03/16/2012 PASARR number received from EDS on 03/16/2012  FL2 transmitted to all facilities in geographic area requested by pt/family on  03/16/2012 FL2 transmitted to all facilities within larger geographic area on   Patient informed that his/her managed care company has contracts with or will negotiate with  certain facilities, including the following:     Patient/family informed of bed offers received:  03/16/2012 Patient chooses bed at [facility] Physician recommends and patient chooses bed at  [facility]  Patient to be transferred to [facility] on  03/16/2012 Patient to be transferred to facility by ptar  The following physician request were entered in Epic:   Additional Comments:

## 2012-03-21 ENCOUNTER — Institutional Professional Consult (permissible substitution): Payer: Medicare Other | Admitting: Pulmonary Disease

## 2012-03-28 ENCOUNTER — Ambulatory Visit: Payer: Medicare Other | Admitting: Physician Assistant

## 2012-03-29 ENCOUNTER — Encounter: Payer: Self-pay | Admitting: Physician Assistant

## 2012-03-29 ENCOUNTER — Other Ambulatory Visit: Payer: Self-pay | Admitting: *Deleted

## 2012-03-29 ENCOUNTER — Ambulatory Visit (INDEPENDENT_AMBULATORY_CARE_PROVIDER_SITE_OTHER): Payer: Medicare Other

## 2012-03-29 ENCOUNTER — Ambulatory Visit (INDEPENDENT_AMBULATORY_CARE_PROVIDER_SITE_OTHER): Payer: Medicare Other | Admitting: Physician Assistant

## 2012-03-29 VITALS — BP 112/50 | HR 51 | Ht 62.0 in | Wt 154.1 lb

## 2012-03-29 DIAGNOSIS — I4891 Unspecified atrial fibrillation: Secondary | ICD-10-CM

## 2012-03-29 DIAGNOSIS — N189 Chronic kidney disease, unspecified: Secondary | ICD-10-CM | POA: Insufficient documentation

## 2012-03-29 DIAGNOSIS — I5042 Chronic combined systolic (congestive) and diastolic (congestive) heart failure: Secondary | ICD-10-CM

## 2012-03-29 DIAGNOSIS — S72009A Fracture of unspecified part of neck of unspecified femur, initial encounter for closed fracture: Secondary | ICD-10-CM

## 2012-03-29 DIAGNOSIS — S72002A Fracture of unspecified part of neck of left femur, initial encounter for closed fracture: Secondary | ICD-10-CM

## 2012-03-29 DIAGNOSIS — I498 Other specified cardiac arrhythmias: Secondary | ICD-10-CM

## 2012-03-29 DIAGNOSIS — I2510 Atherosclerotic heart disease of native coronary artery without angina pectoris: Secondary | ICD-10-CM

## 2012-03-29 DIAGNOSIS — R001 Bradycardia, unspecified: Secondary | ICD-10-CM

## 2012-03-29 MED ORDER — DILTIAZEM HCL 30 MG PO TABS: 30.0000 mg | ORAL_TABLET | Freq: Three times a day (TID) | ORAL | Status: DC

## 2012-03-29 NOTE — Progress Notes (Signed)
[address] [place], [place]  [address] Phone: [phone], Fax:  [phone]  Date:  03/29/2012   ID:  [name] [name], DOB 1923/05/11, MRN [id number]  PCP:  Carilyn Goodpasture, PA  Primary Cardiologist:  Dr. Verne Carrow   Nephrologist:  Dr. Ronnald Collum ([facility])   History of Present Illness: [name] [name] is a 79 y.o. female who returns for follow up after recent admission to the hospital after a fall that resulted in left wrist and hip fracture.   She has a hx of CAD, HTN, HL, COPD, carotid stenosis, status post bilateral CEA, AFib, diastolic CHF.  She had a prolonged hospital stay in 6/13 for Salmonella enteritis with septic shock. She had been on Xarelto for anticoagulation and amiodarone.  She is s/p TEE-DCCV. However, she reverted back to atrial fibrillation.  Patient was readmitted to the hospital 10/2011 with recurrent CDiff colitis and required PRBCs x 1 for worsening anemia.  Plan was to eventually proceed with DCCV again once she had recovered from acute issues.  However, she was admitted again with pneumonia and again with upper GI bleeding.  EGD was negative for source of bleeding but did demonstrate extensive esophageal candidiasis. She was treated with nystatin and eventually transitioned over to fluconazole. Gastroenterology felt that anticoagulation could be resumed. She began to have diarrhea and C. difficile PCR was positive. She was placed on oral vancomycin.  Xarelto had been reduced to 15 mg QD due to low GFR while hospitalized for pneumonia.  Seen in 01/2012 by Dr. Verne Carrow and noted to be back in NSR.  She was kept on Xarelto.    She was admitted 1/7-1/10 after falling and injuring her wrist and left hip. Patient has a reported history of vertigo and is to see vestibular rehabilitation for treatment. Patient had left hip fracture and left distal radius fracture. She underwent ORIF of her radius and pinning of her left femoral  neck fracture. She was seen by cardiology due to bradycardia. Given her advanced age and documented instability with falling, anticoagulation was discontinued. She did have some junctional bradycardia while sleeping. Amiodarone was continued and her Cardizem was cut back to 120 mg daily. Patient had some worsening creatinine while hospitalized. This returned to baseline prior to discharge. Recommendation from cardiology was to proceed with outpatient event monitor after discharge.  She is now staying at [facility].  She has had some episodes of dizziness since d/c.  No syncope.  She did have some possible near syncope.  No chest pain, dyspnea, orthopnea, PND.  LE edema stable. She has had some recent URI symptoms.  Labs (1/14):   K 4, creatinine 1.50, Hgb 8.6, TSH 1.339   Wt Readings from Last 3 Encounters:  03/29/12 154 lb 1.9 oz (69.908 kg)  03/13/12 152 lb 11.2 oz (69.264 kg)  03/13/12 152 lb 11.2 oz (69.264 kg)     Past Medical History  Diagnosis Date  . Hypertension   . Hyperlipidemia   . COPD (chronic obstructive pulmonary disease)   . Osteoporosis   . Arthritis   . H/O hiatal hernia   . Chronic back pain   . CAD (coronary artery disease)     LHC 9/05:  pLAD less than 20%, D1 20-30%, ostial RCA 40-50%, proximal RCA 20%, EF 60%.  . C. difficile diarrhea   . Gout   . Atrial fibrillation     a. failed DCCV => b. amiodarone added => converted to NSR on  Amiodarone;  c. Xarelto d/c'd 2/2 falling (wrist and hip Fx in 1/14)  . Carotid stenosis     s/p bilat CEA  . C. difficile colitis     10/2011;  11/2011  . Salmonella enteritis 08/2011    c/b septic shock  . Hx of echocardiogram     a. Echocardiogram 09/29/11: Mild LVH, EF 45-50%, diffuse HK, mild to moderate aortic stenosis, mean gradient 12 mmHg, moderate MAC, mild MR, moderate LAE, moderate RAE, question small secundum ASD with left to right flow not seen in 4 chamber view, PASP 35-39 (mild pulmonary hypertension). ;  b.  TEE on  10/04/11: Mild LVH, EF 55%, mild MR, no defect or PFO  . Pneumonia 11/2101    Current Outpatient Prescriptions  Medication Sig Dispense Refill  . allopurinol (ZYLOPRIM) 100 MG tablet Take 100 mg by mouth daily.       Marland Kitchen amiodarone (PACERONE) 200 MG tablet Take 100 mg by mouth 2 (two) times daily.       Marland Kitchen aspirin 325 MG tablet Take 1 tablet (325 mg total) by mouth daily.  30 tablet  0  . atorvastatin (LIPITOR) 40 MG tablet Take 40 mg by mouth every evening.       . budesonide-formoterol (SYMBICORT) 160-4.5 MCG/ACT inhaler Inhale 2 puffs into the lungs 2 (two) times daily.      Marland Kitchen diltiazem (CARDIZEM CD) 120 MG 24 hr capsule Take 1 capsule (120 mg total) by mouth daily.      . ferrous fumarate (HEMOCYTE - 106 MG FE) 325 (106 FE) MG TABS Take 1 tablet by mouth 2 (two) times daily.      . fish oil-omega-3 fatty acids 1000 MG capsule Take 1 g by mouth 2 (two) times daily.       . furosemide (LASIX) 40 MG tablet Take 20 mg by mouth daily.       Marland Kitchen levalbuterol (XOPENEX HFA) 45 MCG/ACT inhaler Inhale 1-2 puffs into the lungs every 4 (four) hours as needed. Wheezing      . omeprazole (PRILOSEC) 20 MG capsule Take 20 mg by mouth daily.      . raloxifene (EVISTA) 60 MG tablet Take 60 mg by mouth daily.      Marland Kitchen saccharomyces boulardii (FLORASTOR) 250 MG capsule Take 250 mg by mouth 2 (two) times daily.      . Flaxseed, Linseed, (FLAX SEED OIL PO) Take 1 capsule by mouth 2 (two) times daily.        Allergies:    Allergies  Allergen Reactions  . Baby Powder (Methylbenzethonium) Shortness Of Breath  . Demerol (Meperidine) Hives  . Penicillins Hives    Social History:  The patient  reports that she quit smoking about 28 years ago. Her smoking use included Cigarettes. She has a 20 pack-year smoking history. She has never used smokeless tobacco. She reports that she does not drink alcohol or use illicit drugs.   ROS:  Please see the history of present illness.    All other systems reviewed and negative.    PHYSICAL EXAM: VS:  BP 112/50  Pulse 51  Ht 5\' 2"  (1.575 m)  Wt 154 lb 1.9 oz (69.908 kg)  BMI 28.19 kg/m2 Well nourished, well developed, in no acute distress HEENT: normal Neck: no JVD at 90 Cardiac:  normal S1, S2; RRR; 2/6 systolic murmur at the RUSB Lungs:  clear to auscultation bilaterally, no wheezing, rhonchi or rales Abd: soft, nontender, no hepatomegaly Ext: Trace bilateral LE edema  Skin: warm and dry Neuro:  CNs 2-12 intact, no focal abnormalities noted  EKG:  Sinus bradycardia, HR 51     ASSESSMENT AND PLAN:  1. Bradycardia:  She continues to have bradycardia. She has noted symptoms of lightheadedness and potential near-syncope. I question whether or not she is symptomatic from her bradycardia. I will reduce her diltiazem further to 30 mg every 8 hours. Her event monitor was placed today. She will wear this to completion and then followup with Dr. Verne Carrow. 2. Atrial Fibrillation:  She is maintaining sinus rhythm. She remains on amiodarone. Her amiodarone is also impacting her heart rate. She may ultimately not require rate controlling drugs. Hopefully, she will not display signs of tachybradycardia syndrome. She is now on aspirin only. Anticoagulation was stopped due to fall risk. Question if bradycardia resulted in her falls. We may be able to reconsider anticoagulation in the future after full evaluation with event monitor. 3. Chronic Combined Systolic and Diastolic CHF:  I will obtain recent basic metabolic panel done by her nephrologist. Continue current therapy. 4. Chronic Kidney Disease:  Managed by nephrology. 5. Coronary Artery Disease:  Stable. No angina. Continue aspirin and statin. 6. Hip Fracture:  Management per orthopedics. 7. Disposition:  Followup with Dr. Verne Carrow in 4-6 weeks.  Signed, Tereso Newcomer, PA-C  3:17 PM 03/29/2012

## 2012-03-29 NOTE — Patient Instructions (Addendum)
Your physician recommends that you schedule a follow-up appointment in: 4-6 WEEKS WITH DR. Clifton James   START DILTIAZEM 30 MG DAILY  STOP DILTIAZEM CD 120 MG

## 2012-03-29 NOTE — Progress Notes (Signed)
Placed a 21 day event monitor patient and went over instructions on how to use it and when to return it

## 2012-04-12 ENCOUNTER — Inpatient Hospital Stay (HOSPITAL_COMMUNITY)
Admission: RE | Admit: 2012-04-12 | Discharge: 2012-04-16 | DRG: 481 | Disposition: A | Discharge status: Skilled Nursing Facility | Payer: Medicare Other | Source: Ambulatory Visit | Attending: Internal Medicine | Admitting: Internal Medicine

## 2012-04-12 ENCOUNTER — Inpatient Hospital Stay (HOSPITAL_COMMUNITY): Payer: Medicare Other

## 2012-04-12 ENCOUNTER — Encounter (HOSPITAL_COMMUNITY): Payer: Self-pay | Admitting: Internal Medicine

## 2012-04-12 ENCOUNTER — Emergency Department (HOSPITAL_COMMUNITY)
Admission: EM | Admit: 2012-04-12 | Discharge: 2012-04-12 | Disposition: A | Discharge status: Home / Self Care | Payer: Medicare Other

## 2012-04-12 ENCOUNTER — Encounter (HOSPITAL_COMMUNITY): Payer: Self-pay | Admitting: Pharmacist

## 2012-04-12 DIAGNOSIS — A0472 Enterocolitis due to Clostridium difficile, not specified as recurrent: Secondary | ICD-10-CM

## 2012-04-12 DIAGNOSIS — I10 Essential (primary) hypertension: Secondary | ICD-10-CM | POA: Diagnosis present

## 2012-04-12 DIAGNOSIS — S72002A Fracture of unspecified part of neck of left femur, initial encounter for closed fracture: Secondary | ICD-10-CM

## 2012-04-12 DIAGNOSIS — Z7982 Long term (current) use of aspirin: Secondary | ICD-10-CM

## 2012-04-12 DIAGNOSIS — J4489 Other specified chronic obstructive pulmonary disease: Secondary | ICD-10-CM | POA: Diagnosis present

## 2012-04-12 DIAGNOSIS — Z87891 Personal history of nicotine dependence: Secondary | ICD-10-CM

## 2012-04-12 DIAGNOSIS — M129 Arthropathy, unspecified: Secondary | ICD-10-CM | POA: Diagnosis present

## 2012-04-12 DIAGNOSIS — Z96659 Presence of unspecified artificial knee joint: Secondary | ICD-10-CM

## 2012-04-12 DIAGNOSIS — I2510 Atherosclerotic heart disease of native coronary artery without angina pectoris: Secondary | ICD-10-CM | POA: Diagnosis present

## 2012-04-12 DIAGNOSIS — R9431 Abnormal electrocardiogram [ECG] [EKG]: Secondary | ICD-10-CM | POA: Diagnosis present

## 2012-04-12 DIAGNOSIS — J449 Chronic obstructive pulmonary disease, unspecified: Secondary | ICD-10-CM | POA: Diagnosis present

## 2012-04-12 DIAGNOSIS — K922 Gastrointestinal hemorrhage, unspecified: Secondary | ICD-10-CM

## 2012-04-12 DIAGNOSIS — B3781 Candidal esophagitis: Secondary | ICD-10-CM

## 2012-04-12 DIAGNOSIS — D62 Acute posthemorrhagic anemia: Secondary | ICD-10-CM | POA: Diagnosis present

## 2012-04-12 DIAGNOSIS — Z79899 Other long term (current) drug therapy: Secondary | ICD-10-CM

## 2012-04-12 DIAGNOSIS — X500XXA Overexertion from strenuous movement or load, initial encounter: Secondary | ICD-10-CM | POA: Diagnosis present

## 2012-04-12 DIAGNOSIS — M109 Gout, unspecified: Secondary | ICD-10-CM | POA: Diagnosis present

## 2012-04-12 DIAGNOSIS — I4891 Unspecified atrial fibrillation: Secondary | ICD-10-CM | POA: Diagnosis present

## 2012-04-12 DIAGNOSIS — S62102A Fracture of unspecified carpal bone, left wrist, initial encounter for closed fracture: Secondary | ICD-10-CM | POA: Diagnosis present

## 2012-04-12 DIAGNOSIS — I5042 Chronic combined systolic (congestive) and diastolic (congestive) heart failure: Secondary | ICD-10-CM | POA: Diagnosis present

## 2012-04-12 DIAGNOSIS — E785 Hyperlipidemia, unspecified: Secondary | ICD-10-CM | POA: Diagnosis present

## 2012-04-12 DIAGNOSIS — S7223XA Displaced subtrochanteric fracture of unspecified femur, initial encounter for closed fracture: Principal | ICD-10-CM | POA: Diagnosis present

## 2012-04-12 DIAGNOSIS — M810 Age-related osteoporosis without current pathological fracture: Secondary | ICD-10-CM | POA: Diagnosis present

## 2012-04-12 DIAGNOSIS — N183 Chronic kidney disease, stage 3 unspecified: Secondary | ICD-10-CM | POA: Diagnosis present

## 2012-04-12 DIAGNOSIS — I129 Hypertensive chronic kidney disease with stage 1 through stage 4 chronic kidney disease, or unspecified chronic kidney disease: Secondary | ICD-10-CM | POA: Diagnosis present

## 2012-04-12 DIAGNOSIS — N189 Chronic kidney disease, unspecified: Secondary | ICD-10-CM | POA: Diagnosis present

## 2012-04-12 DIAGNOSIS — D649 Anemia, unspecified: Secondary | ICD-10-CM | POA: Diagnosis present

## 2012-04-12 HISTORY — DX: Chronic kidney disease, unspecified: N18.9

## 2012-04-12 HISTORY — DX: Nausea with vomiting, unspecified: R11.2

## 2012-04-12 HISTORY — DX: Heart failure, unspecified: I50.9

## 2012-04-12 HISTORY — DX: Nausea with vomiting, unspecified: Z98.890

## 2012-04-12 HISTORY — DX: Gastro-esophageal reflux disease without esophagitis: K21.9

## 2012-04-12 HISTORY — DX: Inflammatory liver disease, unspecified: K75.9

## 2012-04-12 LAB — COMPREHENSIVE METABOLIC PANEL
AST: 27 U/L (ref 0–37)
Albumin: 3.4 g/dL — ABNORMAL LOW (ref 3.5–5.2)
BUN: 18 mg/dL (ref 6–23)
CO2: 23 mEq/L (ref 19–32)
Calcium: 9.6 mg/dL (ref 8.4–10.5)
Creatinine, Ser: 1.18 mg/dL — ABNORMAL HIGH (ref 0.50–1.10)
GFR calc non Af Amer: 41 mL/min — ABNORMAL LOW (ref >90)

## 2012-04-12 LAB — CBC
HCT: 32.8 % — ABNORMAL LOW (ref 36.0–46.0)
MCH: 29.5 pg (ref 26.0–34.0)
MCV: 89.6 fL (ref 78.0–100.0)
Platelets: 238 10*3/uL (ref 150–400)
RDW: 15.5 % (ref 11.5–15.5)

## 2012-04-12 LAB — MAGNESIUM: Magnesium: 2 mg/dL (ref 1.5–2.5)

## 2012-04-12 MED ORDER — ASPIRIN EC 325 MG PO TBEC
325.0000 mg | DELAYED_RELEASE_TABLET | Freq: Every day | ORAL | Status: DC
  Administered 2012-04-12 – 2012-04-13 (×2): 325 mg via ORAL
  Filled 2012-04-12 (×3): qty 1

## 2012-04-12 MED ORDER — OMEGA-3 FATTY ACIDS 1000 MG PO CAPS: 1.0000 g | ORAL_CAPSULE | Freq: Two times a day (BID) | ORAL | Status: DC

## 2012-04-12 MED ORDER — MORPHINE SULFATE 2 MG/ML IJ SOLN
1.0000 mg | INTRAMUSCULAR | Status: DC | PRN
  Filled 2012-04-12: qty 1

## 2012-04-12 MED ORDER — FERROUS FUMARATE 325 (106 FE) MG PO TABS
1.0000 | ORAL_TABLET | Freq: Two times a day (BID) | ORAL | Status: DC
  Administered 2012-04-12 – 2012-04-16 (×8): 106 mg via ORAL
  Filled 2012-04-12 (×9): qty 1

## 2012-04-12 MED ORDER — MORPHINE SULFATE 2 MG/ML IJ SOLN
0.5000 mg | INTRAMUSCULAR | Status: DC | PRN
  Administered 2012-04-12 – 2012-04-13 (×2): 0.5 mg via INTRAVENOUS
  Filled 2012-04-12: qty 1

## 2012-04-12 MED ORDER — ALLOPURINOL 100 MG PO TABS
100.0000 mg | ORAL_TABLET | Freq: Every day | ORAL | Status: DC
  Administered 2012-04-12 – 2012-04-16 (×5): 100 mg via ORAL
  Filled 2012-04-12 (×5): qty 1

## 2012-04-12 MED ORDER — ENOXAPARIN SODIUM 40 MG/0.4ML ~~LOC~~ SOLN
40.0000 mg | SUBCUTANEOUS | Status: DC
  Filled 2012-04-12 (×2): qty 0.4

## 2012-04-12 MED ORDER — HYDROCODONE-ACETAMINOPHEN 5-325 MG PO TABS
1.0000 | ORAL_TABLET | Freq: Four times a day (QID) | ORAL | Status: DC | PRN
  Administered 2012-04-12: 2 via ORAL
  Filled 2012-04-12: qty 2

## 2012-04-12 MED ORDER — SACCHAROMYCES BOULARDII 250 MG PO CAPS
250.0000 mg | ORAL_CAPSULE | Freq: Two times a day (BID) | ORAL | Status: DC
  Administered 2012-04-12 – 2012-04-16 (×8): 250 mg via ORAL
  Filled 2012-04-12 (×9): qty 1

## 2012-04-12 MED ORDER — AMIODARONE HCL 100 MG PO TABS
100.0000 mg | ORAL_TABLET | Freq: Every day | ORAL | Status: DC
  Administered 2012-04-12 – 2012-04-16 (×5): 100 mg via ORAL
  Filled 2012-04-12 (×5): qty 1

## 2012-04-12 MED ORDER — OMEGA-3-ACID ETHYL ESTERS 1 G PO CAPS
1.0000 g | ORAL_CAPSULE | Freq: Two times a day (BID) | ORAL | Status: DC
  Administered 2012-04-12 – 2012-04-16 (×8): 1 g via ORAL
  Filled 2012-04-12 (×9): qty 1

## 2012-04-12 MED ORDER — ATORVASTATIN CALCIUM 40 MG PO TABS
40.0000 mg | ORAL_TABLET | Freq: Every evening | ORAL | Status: DC
  Administered 2012-04-12 – 2012-04-15 (×4): 40 mg via ORAL
  Filled 2012-04-12 (×5): qty 1

## 2012-04-12 MED ORDER — RALOXIFENE HCL 60 MG PO TABS
60.0000 mg | ORAL_TABLET | Freq: Every day | ORAL | Status: DC
  Administered 2012-04-12 – 2012-04-13 (×2): 60 mg via ORAL
  Filled 2012-04-12 (×2): qty 1

## 2012-04-12 MED ORDER — BUDESONIDE-FORMOTEROL FUMARATE 160-4.5 MCG/ACT IN AERO
2.0000 | INHALATION_SPRAY | Freq: Two times a day (BID) | RESPIRATORY_TRACT | Status: DC
  Administered 2012-04-12 – 2012-04-16 (×7): 2 via RESPIRATORY_TRACT
  Filled 2012-04-12: qty 6

## 2012-04-12 MED ORDER — DILTIAZEM HCL 30 MG PO TABS
30.0000 mg | ORAL_TABLET | Freq: Three times a day (TID) | ORAL | Status: DC
  Administered 2012-04-12 – 2012-04-14 (×4): 30 mg via ORAL
  Filled 2012-04-12 (×7): qty 1

## 2012-04-12 MED ORDER — FUROSEMIDE 20 MG PO TABS
20.0000 mg | ORAL_TABLET | Freq: Every day | ORAL | Status: DC
  Administered 2012-04-12 – 2012-04-14 (×3): 20 mg via ORAL
  Filled 2012-04-12 (×5): qty 1

## 2012-04-12 MED ORDER — CALCIUM CARBONATE 1250 (500 CA) MG PO TABS
1.0000 | ORAL_TABLET | Freq: Every day | ORAL | Status: DC
  Administered 2012-04-13 – 2012-04-16 (×4): 500 mg via ORAL
  Filled 2012-04-12 (×5): qty 1

## 2012-04-12 MED ORDER — SODIUM CHLORIDE 0.9 % IV SOLN: INTRAVENOUS | Status: DC

## 2012-04-12 MED ORDER — LEVALBUTEROL TARTRATE 45 MCG/ACT IN AERO: 1.0000 | INHALATION_SPRAY | RESPIRATORY_TRACT | Status: DC | PRN

## 2012-04-12 MED ORDER — PANTOPRAZOLE SODIUM 40 MG PO TBEC
40.0000 mg | DELAYED_RELEASE_TABLET | Freq: Every day | ORAL | Status: DC
  Administered 2012-04-12 – 2012-04-16 (×5): 40 mg via ORAL
  Filled 2012-04-12 (×5): qty 1

## 2012-04-12 NOTE — Consult Note (Addendum)
Reason for Consult:left femur fx  Referring Physician: N/A  [name] [name] is an 79 y.o. female.  History: This patient returns for previously unscheduled evaluation.  She transitioned from [facility] to home last week.  She reports that earlier this morning she was turning and felt a pop before falling.  She has had pain and inability to bear weight on the left leg since.  Past Medical History  Diagnosis Date  . Hypertension   . Hyperlipidemia   . COPD (chronic obstructive pulmonary disease)   . Osteoporosis   . Arthritis   . H/O hiatal hernia   . Chronic back pain   . CAD (coronary artery disease)     LHC 9/05:  pLAD less than 20%, D1 20-30%, ostial RCA 40-50%, proximal RCA 20%, EF 60%.  . C. difficile diarrhea   . Gout   . Atrial fibrillation     a. failed DCCV => b. amiodarone added => converted to NSR on Amiodarone;  c. Xarelto d/c'd 2/2 falling (wrist and hip Fx in 1/14)  . Carotid stenosis     s/p bilat CEA  . C. difficile colitis     10/2011;  11/2011  . Salmonella enteritis 08/2011    c/b septic shock  . Hx of echocardiogram     a. Echocardiogram 09/29/11: Mild LVH, EF 45-50%, diffuse HK, mild to moderate aortic stenosis, mean gradient 12 mmHg, moderate MAC, mild MR, moderate LAE, moderate RAE, question small secundum ASD with left to right flow not seen in 4 chamber view, PASP 35-39 (mild pulmonary hypertension). ;  b.  TEE on 10/04/11: Mild LVH, EF 55%, mild MR, no defect or PFO  . Pneumonia 11/2101    Past Surgical History  Procedure Date  . Carotid endarterectomy     bilateral  . Cataract extraction, bilateral   . Joint replacement 2008    Right Total Knee  . Anal fistula repair   . Left parotidectomy   . Eye surgery   . Flexible sigmoidoscopy 09/02/2011    Procedure: FLEXIBLE SIGMOIDOSCOPY;  Surgeon: Theda Belfast, MD;  Location: [facility] ENDOSCOPY;  Service: Endoscopy;  Laterality: N/A;  . Flexible sigmoidoscopy 09/03/2011    Procedure: FLEXIBLE SIGMOIDOSCOPY;   Surgeon: Theda Belfast, MD;  Location: [facility] ENDOSCOPY;  Service: Endoscopy;  Laterality: N/A;  . Tee without cardioversion 10/04/2011    Procedure: TRANSESOPHAGEAL ECHOCARDIOGRAM (TEE);  Surgeon: Laurey Morale, MD;  Location: [facility] ENDOSCOPY;  Service: Cardiovascular;  Laterality: N/A;  to be carelinked here by 1230-verified 7/29/dl  . Cardioversion 10/04/2011    Procedure: CARDIOVERSION;  Surgeon: Laurey Morale, MD;  Location: [facility] ENDOSCOPY;  Service: Cardiovascular;  Laterality: N/A;  . Esophagogastroduodenoscopy 11/29/2011    Procedure: ESOPHAGOGASTRODUODENOSCOPY (EGD);  Surgeon: Florencia Reasons, MD;  Location: [facility] END[facility]COPY;  Service: Endoscopy;  Laterality: N/A;  recent h/o resp failure/pneumonia  . Left hip fracture repair 03/14/12  . Open reduction internal fixation (orif) scaphoid with distal radius graft 03/14/2012    Procedure: OPEN REDUCTION INTERNAL FIXATION (ORIF) SCAPHOID WITH DISTAL RADIUS GRAFT;  Surgeon: Jodi Marble, MD;  Location: [facility] ORS;  Service: Orthopedics;  Laterality: Left;  DVR wrist fracture set/ hand innovation  . Hip pinning,cannulated 03/14/2012    Procedure: CANNULATED HIP PINNING;  Surgeon: Jodi Marble, MD;  Location: [facility] ORS;  Service: Orthopedics;  Laterality: Left;  Biomet 6.5 cannulated screws    Family History  Problem Relation Age of Onset  . Hemochromatosis Sister   . Diabetes type  II Sister     Social History:  reports that she quit smoking about 28 years ago. Her smoking use included Cigarettes. She has a 20 pack-year smoking history. She has never used smokeless tobacco. She reports that she does not drink alcohol or use illicit drugs.  Allergies:  Allergies  Allergen Reactions  . Baby Powder (Methylbenzethonium) Shortness Of Breath  . Demerol (Meperidine) Hives  . Penicillins Hives    Medications: I have reviewed the patient's current medications.  No results found for this or any previous visit (from the past 48 hour(s)).  No results  found.  Review of Systems  All other systems reviewed and are negative.   Blood pressure 173/64, pulse 75, temperature 98.6 F (37 C), resp. rate 18, SpO2 100.00%. Physical Exam  Constitutional:  WD, WN, NAD HEENT:  NCAT, EOMI Neuro/Psych:  Alert & oriented to person, place, and time; appropriate mood & affect Lymphatic: No generalized LE edema or lymphadenopathy Extremities / MSK:  Both LE are normal with respect to appearance, ranges of motion, joint stability, muscle strength/tone, sensation, & perfusion except as otherwise noted:  Incisions are benign.  Intact light touch sensation across all digital tips on the left hand.  There is a fullness in the anterior aspect of the left thigh and gross movement through the femur.  This appears to be at the proximal femur.  Labs / Xrays:  Multiple views of the left femur ordered and obtained today reveal  3 cannulated screws securing the femoral neck fracture with no change in alignment of a slightly valgus and anteriorly angulated impacted femoral neck fracture, but with an apex-anterior angulated slightly oblique subtrochanteric fracture.  In the lateral view, there may be a slight abnormality in the cortex with some fuzziness.  Assessment: Acute left subtrochanteric femur fracture superimposed upon a healing left femoral neck fracture.  Plan:  These findings were discussed with the patient and her daughter.  I have recommended revision ORIF of the left proximal femur.  We will plan to proceed 04-13-12 in the afternoon.    I will make the arrangements for the appropriate equipment to be on hand.  I will also obtain a CT scan of the left proximal femur to help ensure that this is not a pathological fracture of some sort other than osteoporosis.  The details of the operative procedure were discussed with the patient.  Questions were invited and answered.  In addition to the goal of the procedure, the risks of the procedure to include but not limited  to bleeding; infection; damage to the nerves or blood vessels that could result in bleeding, numbness, weakness, chronic pain, and the need for additional procedures; stiffness; the need for revision surgery; and anesthetic risks, the worst of which is death, were reviewed.  No specific outcome was guaranteed or implied.  Informed consent was obtained.  Jessie Cowher A. 04/12/2012, 5:01 PM

## 2012-04-12 NOTE — ED Notes (Signed)
Per EMS- pt reported feeling her left leg "go out". Pt went to [facility] and was told that she had a left femur fracture. Pt received of fentanyl. En route.

## 2012-04-12 NOTE — H&P (Signed)
Patient's PCP: Carilyn Goodpasture, PA  Chief Complaint: Left hip pain.  History of Present Illness: [name] [name] is a 79 y.o. Caucasian female with history of hypertension, hyperlipidemia, COPD, coronary artery disease, atrial fibrillation on amiodarone anticoagulation discontinued in January of 2014 due to high fall risk currently on aspirin, history of Salmonella enteritis, C. difficile colitis, chronic combined systolic and diastolic heart failure, chronic kidney disease stage III, and recent history of hip fracture on the left status post surgery in January of 2014 who presents with the above complaints. Patient has currently been residing at [facility] since discharge on 03/16/2012 from the hospital for hip fracture getting rehabilitation.  Patient this morning was turning to one side and felt something pop before sliding down.  She denies any fall.  She denies losing consciousness.  She noted sharp pain in the left hip as a result she presented to Dr. Carollee Massed office with orthopedic service, imaging showed left subtrochanteric femur fracture, records unavailable to me. She was transferred to the hospital for further care and management.  She denies any recent fevers or chills. Denies any chest pain. Has intermittent cough at baseline.  Denies any abdominal pain or diarrhea.  Denies any headaches or vision changes.  Review of Systems: All systems reviewed with the patient and positive as per history of present illness, otherwise all other systems are negative.  Past Medical History  Diagnosis Date  . Hypertension   . Hyperlipidemia   . COPD (chronic obstructive pulmonary disease)   . Osteoporosis   . Arthritis   . H/O hiatal hernia   . Chronic back pain   . CAD (coronary artery disease)     LHC 9/05:  pLAD less than 20%, D1 20-30%, ostial RCA 40-50%, proximal RCA 20%, EF 60%.  . C. difficile diarrhea   . Gout   . Atrial fibrillation     a. failed DCCV => b. amiodarone added => converted to  NSR on Amiodarone;  c. Xarelto d/c'd 2/2 falling (wrist and hip Fx in 1/14)  . Carotid stenosis     s/p bilat CEA  . C. difficile colitis     10/2011;  11/2011  . Salmonella enteritis 08/2011    c/b septic shock  . Hx of echocardiogram     a. Echocardiogram 09/29/11: Mild LVH, EF 45-50%, diffuse HK, mild to moderate aortic stenosis, mean gradient 12 mmHg, moderate MAC, mild MR, moderate LAE, moderate RAE, question small secundum ASD with left to right flow not seen in 4 chamber view, PASP 35-39 (mild pulmonary hypertension). ;  b.  TEE on 10/04/11: Mild LVH, EF 55%, mild MR, no defect or PFO  . Pneumonia 11/2101   Past Surgical History  Procedure Date  . Carotid endarterectomy     bilateral  . Cataract extraction, bilateral   . Joint replacement 2008    Right Total Knee  . Anal fistula repair   . Left parotidectomy   . Eye surgery   . Flexible sigmoidoscopy 09/02/2011    Procedure: FLEXIBLE SIGMOIDOSCOPY;  Surgeon: Theda Belfast, MD;  Location: [facility] ENDOSCOPY;  Service: Endoscopy;  Laterality: N/A;  . Flexible sigmoidoscopy 09/03/2011    Procedure: FLEXIBLE SIGMOIDOSCOPY;  Surgeon: Theda Belfast, MD;  Location: [facility] ENDOSCOPY;  Service: Endoscopy;  Laterality: N/A;  . Tee without cardioversion 10/04/2011    Procedure: TRANSESOPHAGEAL ECHOCARDIOGRAM (TEE);  Surgeon: Laurey Morale, MD;  Location: [facility] ENDOSCOPY;  Service: Cardiovascular;  Laterality: N/A;  to be carelinked here by 1230-verified 7/29/dl  .  Cardioversion 10/04/2011    Procedure: CARDIOVERSION;  Surgeon: Laurey Morale, MD;  Location: [facility] ENDOSCOPY;  Service: Cardiovascular;  Laterality: N/A;  . Esophagogastroduodenoscopy 11/29/2011    Procedure: ESOPHAGOGASTRODUODENOSCOPY (EGD);  Surgeon: Florencia Reasons, MD;  Location: [facility] ENDOSCOPY;  Service: Endoscopy;  Laterality: N/A;  recent h/o resp failure/pneumonia  . Left hip fracture repair 03/14/12  . Open reduction internal fixation (orif) scaphoid with distal radius graft 03/14/2012     Procedure: OPEN REDUCTION INTERNAL FIXATION (ORIF) SCAPHOID WITH DISTAL RADIUS GRAFT;  Surgeon: Jodi Marble, MD;  Location: [facility] ORS;  Service: Orthopedics;  Laterality: Left;  DVR wrist fracture set/ hand innovation  . Hip pinning,cannulated 03/14/2012    Procedure: CANNULATED HIP PINNING;  Surgeon: Jodi Marble, MD;  Location: [facility] ORS;  Service: Orthopedics;  Laterality: Left;  Biomet 6.5 cannulated screws   Family History  Problem Relation Age of Onset  . Hemochromatosis Sister   . Diabetes type II Mother    History   Social History  . Marital Status: Widowed    Spouse Name: N/A    Number of Children: N/A  . Years of Education: N/A   Occupational History  . Not on file.   Social History Main Topics  . Smoking status: Former Smoker -- 1.0 packs/day for 20 years    Types: Cigarettes    Quit date: 09/01/1983  . Smokeless tobacco: Never Used  . Alcohol Use: No  . Drug Use: No  . Sexually Active: No   Other Topics Concern  . Not on file   Social History Narrative  . No narrative on file   Allergies: Baby powder; Demerol; and Penicillins  Home Meds: Prior to Admission medications   Medication Sig Start Date End Date Taking? Authorizing Provider  amiodarone (PACERONE) 400 MG tablet Take 100 mg by mouth daily.   Yes Historical Provider, MD  aspirin EC 325 MG tablet Take 325 mg by mouth daily.   Yes Historical Provider, MD  furosemide (LASIX) 20 MG tablet Take 20 mg by mouth daily.   Yes Historical Provider, MD  allopurinol (ZYLOPRIM) 100 MG tablet Take 100 mg by mouth daily.     Historical Provider, MD  atorvastatin (LIPITOR) 40 MG tablet Take 40 mg by mouth every evening.     Historical Provider, MD  budesonide-formoterol (SYMBICORT) 160-4.5 MCG/ACT inhaler Inhale 2 puffs into the lungs 2 (two) times daily.    Historical Provider, MD  diltiazem (CARDIZEM) 30 MG tablet Take 30 mg by mouth 3 (three) times daily. 03/29/12   Beatrice Lecher, PA  ferrous fumarate (HEMOCYTE -  106 MG FE) 325 (106 FE) MG TABS Take 1 tablet by mouth 2 (two) times daily.    Historical Provider, MD  fish oil-omega-3 fatty acids 1000 MG capsule Take 1 g by mouth 2 (two) times daily.     Historical Provider, MD  Flaxseed, Linseed, (FLAX SEED OIL PO) Take 1 capsule by mouth 2 (two) times daily.    Historical Provider, MD  levalbuterol [facility] HFA) 45 MCG/ACT inhaler Inhale 1-2 puffs into the lungs every 4 (four) hours as needed. Wheezing    Historical Provider, MD  omeprazole (PRILOSEC) 20 MG capsule Take 20 mg by mouth daily.    Historical Provider, MD  raloxifene (EVISTA) 60 MG tablet Take 60 mg by mouth daily.    Historical Provider, MD  saccharomyces boulardii (FLORASTOR) 250 MG capsule Take 250 mg by mouth 2 (two) times daily.    Historical Provider, MD  Physical Exam: Blood pressure 173/64, pulse 75, temperature 98.6 F (37 C), resp. rate 18, SpO2 100.00%. General: Awake, Oriented x3, in some distress from hip pain. HEENT: EOMI, Moist mucous membranes Neck: Supple CV: S1 and S2 Lungs: Clear to ascultation bilaterally Abdomen: Soft, Nontender, Nondistended, +bowel sounds. Ext: Good pulses. Trace edema. No clubbing or cyanosis noted.  Left hip swollen and tender to touch. Neuro: Cranial Nerves II-XII grossly intact. Has 5/5 motor strength in upper and lower extremities.  Lab results:  [facility] 04/12/12 1652  NA 137  K 4.2  CL 100  CO2 23  GLUCOSE 95  BUN 18  CREATININE 1.18*  CALCIUM 9.6  MG --  PHOS --    Basename 04/12/12 1652  AST 27  ALT 21  ALKPHOS 96  BILITOT 0.4  PROT 7.2  ALBUMIN 3.4*   No results found for this basename: LIPASE:2,AMYLASE:2 in the last 72 hours  Basename 04/12/12 1652  WBC 10.6*  NEUTROABS --  HGB 10.8*  HCT 32.8*  MCV 89.6  PLT 238   No results found for this basename: CKTOTAL:3,CKMB:3,CKMBINDEX:3,TROPONINI:3 in the last 72 hours No components found with this basename: POCBNP:3 No results found for this basename: DDIMER in  the last 72 hours No results found for this basename: HGBA1C:2 in the last 72 hours No results found for this basename: CHOL:2,HDL:2,LDLCALC:2,TRIG:2,CHOLHDL:2,LDLDIRECT:2 in the last 72 hours No results found for this basename: TSH,T4TOTAL,FREET3,T3FREE,THYROIDAB in the last 72 hours No results found for this basename: VITAMINB12:2,FOLATE:2,FERRITIN:2,TIBC:2,IRON:2,RETICCTPCT:2 in the last 72 hours Imaging results:  Dg Wrist 2 Views Left  03/13/2012  *RADIOLOGY REPORT*  Clinical Data: Closed reduction, wrist pain with fracture, fell.  LEFT WRIST - 2 VIEW  Comparison: Priors earlier today.  Findings: Comminuted intra-articular distal radius fracture and ulnar styloid avulsion are redemonstrated.  It is difficult to assess for interval improvement because no true lateral film was obtained initially. Slight dorsal angulation noted.  IMPRESSION: Closed reduction as described.   Original Report Authenticated By: Davonna Belling, M.D.    Dg Wrist Complete Left  03/14/2012  *RADIOLOGY REPORT*  Clinical Data: Wrist fracture.  LEFT WRIST - COMPLETE 3+ VIEW  Comparison: 03/13/2012.  Findings: Volar plate screw fixation of the left wrist.  Near anatomic alignment.  Three intraoperative fluoroscopic spot films are submitted.  Ulnar styloid fracture and the visualized.  IMPRESSION: ORIF of the distal left radius.   Original Report Authenticated By: Andreas Newport, M.D.    Dg Hip Operative Left  03/14/2012  *RADIOLOGY REPORT*  Clinical Data: Left hip fracture.  OPERATIVE LEFT HIP  Comparison: None.  Findings: Three cannulated left hip screws are demonstrated on AP and lateral fluoroscopic spot views.  No complicating features identified.  IMPRESSION: ORIF left hip.   Original Report Authenticated By: Andreas Newport, M.D.    Dg C-arm 1-60 Min-no Report  03/14/2012  CLINICAL DATA: wrist left   C-ARM 1-60 MINUTES  Fluoroscopy was utilized by the requesting physician.  No radiographic  interpretation.     Other  results: EKG:  sinus rhythm with heart rate of 80.  Assessment & Plan by Problem: Left sub-trochanteric femur fracture with recent history of left hip fracture status post surgery on 03/14/2012 Further management as per Dr. Janee Morn, orthopedic service.  Patient at moderate risk for surgery given age, has had surgery last month, recommend no further cardiac workup. Pain control with pain medications.  History of atrial fibrillation Not on anticoagulation due to risk of falls.  Rate controlled, currently in sinus.  Currently on an event monitor, will place the patient on telemetry, while patient is hospitalized.  Dizziness/history of falls Denies any dizziness or fall today.  Currently on event monitor, on telemetry.  Coronary artery disease Continue aspirin.  Stable.  Chronic kidney disease stage III Baseline creatinine of 1.2-1.3.  Appears to be at her baseline.  Osteoporosis Start calcium with vitamin D.  Hypertension Blood pressure elevated likely due to pain.  Continue diltiazem, and furosemide.  Chronic combined systolic and diastolic heart failure Patient compensated.  Continue furosemide.  Last available 2-D echocardiogram on 10/04/2011 showed EF of 55%.  Avoid aggressive IV hydration.  Left wrist fracture Noted from previous hospitalization.  Continue brace.  Anemia Likely due to chronic kidney disease and acute blood loss anemia from hip fracture.  Continued to monitor.  Patient has been typed and screened.  QTC prolongation Discussed with Dr. Shirlee Latch, cardiology, believes is artifactual. Recommended continuing current medications including amiodarone. Check Magnesium and do another EKG in the AM. Reviewed medications with pharmacy to discontinue any medications that could potentially cause QTC prolongation.  Prophylaxis Lovenox.  CODE STATUS Full code.  This was discussed with patient and daughter at the time of admission.  Patient indicated that she does not wish to  be on life support long-term but wishes to have everything done short term.  Disposition Admit the patient as inpatient to telemetry.  Time spent on admission, talking to the patient, and coordinating care was: 60 mins.  [name] [name] A, MD 04/12/2012, 5:54 PM

## 2012-04-12 NOTE — ED Provider Notes (Signed)
Pt not seen by me in the ED.  I went to evaluate her and we learned that she was supposed to be directly admitted to the hospital.  Pt will be transported up to her bed.   Pt appears medically stable to proceed upstairs.  Celene Kras, MD 04/12/12 [phone]

## 2012-04-13 ENCOUNTER — Encounter (HOSPITAL_COMMUNITY): Payer: Self-pay | Admitting: Anesthesiology

## 2012-04-13 ENCOUNTER — Encounter (HOSPITAL_COMMUNITY)
Admission: RE | Disposition: A | Discharge status: Skilled Nursing Facility | Payer: Self-pay | Source: Ambulatory Visit | Attending: Internal Medicine

## 2012-04-13 ENCOUNTER — Inpatient Hospital Stay (HOSPITAL_COMMUNITY): Payer: Medicare Other

## 2012-04-13 ENCOUNTER — Inpatient Hospital Stay (HOSPITAL_COMMUNITY): Payer: Medicare Other | Admitting: Anesthesiology

## 2012-04-13 DIAGNOSIS — N179 Acute kidney failure, unspecified: Secondary | ICD-10-CM

## 2012-04-13 DIAGNOSIS — D62 Acute posthemorrhagic anemia: Secondary | ICD-10-CM

## 2012-04-13 HISTORY — PX: FEMUR IM NAIL: SHX1597

## 2012-04-13 LAB — CBC
Hemoglobin: 9.5 g/dL — ABNORMAL LOW (ref 12.0–15.0)
MCH: 30.4 pg (ref 26.0–34.0)
Platelets: 199 10*3/uL (ref 150–400)
RBC: 3.12 MIL/uL — ABNORMAL LOW (ref 3.87–5.11)
WBC: 6.2 10*3/uL (ref 4.0–10.5)

## 2012-04-13 LAB — BASIC METABOLIC PANEL
CO2: 29 mEq/L (ref 19–32)
Calcium: 8.9 mg/dL (ref 8.4–10.5)
Chloride: 100 mEq/L (ref 96–112)
Glucose, Bld: 102 mg/dL — ABNORMAL HIGH (ref 70–99)
Potassium: 3.8 mEq/L (ref 3.5–5.1)
Sodium: 139 mEq/L (ref 135–145)

## 2012-04-13 SURGERY — INSERTION, INTRAMEDULLARY ROD, FEMUR
Anesthesia: General | Site: Leg Upper | Laterality: Left | Wound class: Clean

## 2012-04-13 MED ORDER — HYDROCODONE-ACETAMINOPHEN 5-325 MG PO TABS: 1.0000 | ORAL_TABLET | Freq: Four times a day (QID) | ORAL | Status: DC | PRN

## 2012-04-13 MED ORDER — LIDOCAINE HCL (CARDIAC) 20 MG/ML IV SOLN
INTRAVENOUS | Status: DC | PRN
  Administered 2012-04-13: 50 mg via INTRAVENOUS

## 2012-04-13 MED ORDER — PHENYLEPHRINE HCL 10 MG/ML IJ SOLN
INTRAMUSCULAR | Status: DC | PRN
  Administered 2012-04-13: 40 ug via INTRAVENOUS
  Administered 2012-04-13: 80 ug via INTRAVENOUS
  Administered 2012-04-13: 40 ug via INTRAVENOUS
  Administered 2012-04-13: 20 ug via INTRAVENOUS

## 2012-04-13 MED ORDER — MORPHINE SULFATE 2 MG/ML IJ SOLN
0.5000 mg | INTRAMUSCULAR | Status: DC | PRN
  Administered 2012-04-14 (×2): 0.5 mg via INTRAVENOUS
  Filled 2012-04-13 (×2): qty 1

## 2012-04-13 MED ORDER — ACETAMINOPHEN 10 MG/ML IV SOLN
1000.0000 mg | Freq: Once | INTRAVENOUS | Status: AC | PRN
  Filled 2012-04-13: qty 100

## 2012-04-13 MED ORDER — ONDANSETRON HCL 4 MG/2ML IJ SOLN
INTRAMUSCULAR | Status: DC | PRN
  Administered 2012-04-13 (×2): 4 mg via INTRAVENOUS

## 2012-04-13 MED ORDER — ONDANSETRON HCL 4 MG PO TABS: 4.0000 mg | ORAL_TABLET | Freq: Four times a day (QID) | ORAL | Status: DC | PRN

## 2012-04-13 MED ORDER — OXYCODONE HCL 5 MG/5ML PO SOLN: 5.0000 mg | Freq: Once | ORAL | Status: AC | PRN

## 2012-04-13 MED ORDER — FENTANYL CITRATE 0.05 MG/ML IJ SOLN
INTRAMUSCULAR | Status: DC | PRN
  Administered 2012-04-13: 50 ug via INTRAVENOUS

## 2012-04-13 MED ORDER — METOCLOPRAMIDE HCL 5 MG/ML IJ SOLN
5.0000 mg | Freq: Three times a day (TID) | INTRAMUSCULAR | Status: DC | PRN
Start: 2012-04-13 — End: 2012-04-16

## 2012-04-13 MED ORDER — CLINDAMYCIN PHOSPHATE 600 MG/50ML IV SOLN
600.0000 mg | Freq: Four times a day (QID) | INTRAVENOUS | Status: AC
  Administered 2012-04-13 – 2012-04-14 (×2): 600 mg via INTRAVENOUS
  Filled 2012-04-13 (×2): qty 50

## 2012-04-13 MED ORDER — VANCOMYCIN HCL 1000 MG IV SOLR
1000.0000 mg | INTRAVENOUS | Status: DC | PRN
  Administered 2012-04-13: 1000 mg via INTRAVENOUS

## 2012-04-13 MED ORDER — VECURONIUM BROMIDE 10 MG IV SOLR
INTRAVENOUS | Status: DC | PRN
  Administered 2012-04-13: 7 mg via INTRAVENOUS

## 2012-04-13 MED ORDER — PROPOFOL 10 MG/ML IV BOLUS
INTRAVENOUS | Status: DC | PRN
  Administered 2012-04-13: 100 mg via INTRAVENOUS

## 2012-04-13 MED ORDER — ASPIRIN EC 325 MG PO TBEC
325.0000 mg | DELAYED_RELEASE_TABLET | Freq: Every day | ORAL | Status: DC
  Administered 2012-04-14 – 2012-04-16 (×3): 325 mg via ORAL
  Filled 2012-04-13 (×4): qty 1

## 2012-04-13 MED ORDER — MENTHOL 3 MG MT LOZG: 1.0000 | LOZENGE | OROMUCOSAL | Status: DC | PRN

## 2012-04-13 MED ORDER — ONDANSETRON HCL 4 MG/2ML IJ SOLN
4.0000 mg | Freq: Four times a day (QID) | INTRAMUSCULAR | Status: DC | PRN
  Filled 2012-04-13: qty 2

## 2012-04-13 MED ORDER — NEOSTIGMINE METHYLSULFATE 1 MG/ML IJ SOLN
INTRAMUSCULAR | Status: DC | PRN
  Administered 2012-04-13: 4 mg via INTRAVENOUS

## 2012-04-13 MED ORDER — METOCLOPRAMIDE HCL 10 MG PO TABS: 5.0000 mg | ORAL_TABLET | Freq: Three times a day (TID) | ORAL | Status: DC | PRN

## 2012-04-13 MED ORDER — HYDROCODONE-ACETAMINOPHEN 5-325 MG PO TABS
1.0000 | ORAL_TABLET | Freq: Four times a day (QID) | ORAL | Status: DC | PRN
  Administered 2012-04-13 – 2012-04-16 (×10): 2 via ORAL
  Filled 2012-04-13 (×10): qty 2

## 2012-04-13 MED ORDER — PHENOL 1.4 % MT LIQD: 1.0000 | OROMUCOSAL | Status: DC | PRN

## 2012-04-13 MED ORDER — LACTATED RINGERS IV SOLN
INTRAVENOUS | Status: DC | PRN
  Administered 2012-04-13: 13:00:00 via INTRAVENOUS

## 2012-04-13 MED ORDER — GLYCOPYRROLATE 0.2 MG/ML IJ SOLN
INTRAMUSCULAR | Status: DC | PRN
  Administered 2012-04-13: .8 mg via INTRAVENOUS

## 2012-04-13 MED ORDER — PROMETHAZINE HCL 25 MG/ML IJ SOLN: 6.2500 mg | INTRAMUSCULAR | Status: DC | PRN

## 2012-04-13 MED ORDER — ACETAMINOPHEN 325 MG PO TABS: 650.0000 mg | ORAL_TABLET | Freq: Four times a day (QID) | ORAL | Status: DC | PRN

## 2012-04-13 MED ORDER — 0.9 % SODIUM CHLORIDE (POUR BTL) OPTIME
TOPICAL | Status: DC | PRN
  Administered 2012-04-13: 1000 mL

## 2012-04-13 MED ORDER — EPHEDRINE SULFATE 50 MG/ML IJ SOLN
INTRAMUSCULAR | Status: DC | PRN
  Administered 2012-04-13 (×2): 10 mg via INTRAVENOUS

## 2012-04-13 MED ORDER — OXYCODONE HCL 5 MG PO TABS: 5.0000 mg | ORAL_TABLET | Freq: Once | ORAL | Status: AC | PRN

## 2012-04-13 MED ORDER — HYDROMORPHONE HCL PF 1 MG/ML IJ SOLN: 0.2500 mg | INTRAMUSCULAR | Status: DC | PRN

## 2012-04-13 MED ORDER — ARTIFICIAL TEARS OP OINT
TOPICAL_OINTMENT | OPHTHALMIC | Status: DC | PRN
  Administered 2012-04-13: 1 via OPHTHALMIC

## 2012-04-13 MED ORDER — ACETAMINOPHEN 650 MG RE SUPP: 650.0000 mg | Freq: Four times a day (QID) | RECTAL | Status: DC | PRN

## 2012-04-13 SURGICAL SUPPLY — 48 items
3.5MM SOLDLOK HEX TIP ×2 IMPLANT
BIT DRILL 4.3MMS DISTAL GRDTED (BIT) ×1 IMPLANT
BLADE SURG 15 STRL LF DISP TIS (BLADE) ×1 IMPLANT
BLADE SURG 15 STRL SS (BLADE) ×1
BNDG COHESIVE 4X5 TAN STRL (GAUZE/BANDAGES/DRESSINGS) ×2 IMPLANT
CHLORAPREP W/TINT 26ML (MISCELLANEOUS) IMPLANT
CLOTH BEACON ORANGE TIMEOUT ST (SAFETY) ×2 IMPLANT
COVER MAYO STAND STRL (DRAPES) ×2 IMPLANT
COVER SURGICAL LIGHT HANDLE (MISCELLANEOUS) ×2 IMPLANT
COVER TABLE BACK 60X90 (DRAPES) ×2 IMPLANT
DRAPE C-ARM 42X72 X-RAY (DRAPES) ×2 IMPLANT
DRAPE C-ARMOR (DRAPES) ×2 IMPLANT
DRAPE STERI IOBAN 125X83 (DRAPES) ×2 IMPLANT
DRILL 4.3MMS DISTAL GRADUATED (BIT) ×2
DRSG MEPILEX BORDER 4X4 (GAUZE/BANDAGES/DRESSINGS) ×2 IMPLANT
DRSG MEPILEX BORDER 4X8 (GAUZE/BANDAGES/DRESSINGS) ×2 IMPLANT
DRSG PAD ABDOMINAL 8X10 ST (GAUZE/BANDAGES/DRESSINGS) ×2 IMPLANT
DURAPREP 26ML APPLICATOR (WOUND CARE) ×4 IMPLANT
ELECT REM PT RETURN 9FT ADLT (ELECTROSURGICAL) ×2
ELECTRODE REM PT RTRN 9FT ADLT (ELECTROSURGICAL) ×1 IMPLANT
GAUZE XEROFORM 5X9 LF (GAUZE/BANDAGES/DRESSINGS) ×2 IMPLANT
GLOVE BIO SURGEON STRL SZ7.5 (GLOVE) ×4 IMPLANT
GLOVE BIOGEL PI IND STRL 8 (GLOVE) ×2 IMPLANT
GLOVE BIOGEL PI INDICATOR 8 (GLOVE) ×2
GOWN STRL NON-REIN LRG LVL3 (GOWN DISPOSABLE) ×8 IMPLANT
GUIDEPIN 3.2X17.5 THRD DISP (PIN) ×4 IMPLANT
GUIDEWIRE BALL NOSE 80CM (WIRE) ×2 IMPLANT
HFN A/R SCREW 90MM (Screw) ×2 IMPLANT
HIP FRA NAIL LAG SCREW 10.5X90 (Orthopedic Implant) ×2 IMPLANT
HIP FRAC NAIL LEFT 11X360MM (Orthopedic Implant) ×2 IMPLANT
KIT BASIN OR (CUSTOM PROCEDURE TRAY) ×2 IMPLANT
KIT ROOM TURNOVER OR (KITS) ×2 IMPLANT
MANIFOLD NEPTUNE II (INSTRUMENTS) ×2 IMPLANT
NAIL HIP FRAC LEFT 11X360MM (Orthopedic Implant) ×1 IMPLANT
NS IRRIG 1000ML POUR BTL (IV SOLUTION) ×2 IMPLANT
PACK GENERAL/GYN (CUSTOM PROCEDURE TRAY) ×2 IMPLANT
PAD ARMBOARD 7.5X6 YLW CONV (MISCELLANEOUS) ×4 IMPLANT
SCREW ANTI ROTATION 80MM (Screw) ×2 IMPLANT
SCREW BONE CORTICAL 5.0X44 (Screw) ×2 IMPLANT
SCREW DRILL BIT ANIT ROTATION (BIT) ×2 IMPLANT
SCREW LAG HIP FRA NAIL 10.5X90 (Orthopedic Implant) ×1 IMPLANT
SPONGE GAUZE 4X4 12PLY (GAUZE/BANDAGES/DRESSINGS) ×2 IMPLANT
STAPLER VISISTAT 35W (STAPLE) IMPLANT
SUT VIC AB 0 CT1 27 (SUTURE) ×2
SUT VIC AB 0 CT1 27XBRD ANBCTR (SUTURE) ×2 IMPLANT
SUT VIC AB 2-0 CT1 27 (SUTURE) ×2
SUT VIC AB 2-0 CT1 TAPERPNT 27 (SUTURE) ×2 IMPLANT
WATER STERILE IRR 1000ML POUR (IV SOLUTION) ×4 IMPLANT

## 2012-04-13 NOTE — Anesthesia Preprocedure Evaluation (Addendum)
Anesthesia Evaluation  Patient identified by MRN, date of birth, ID band Patient awake    Reviewed: Allergy & Precautions, H&P , NPO status , Patient's Chart, lab work & pertinent test results, reviewed documented beta blocker date and time   History of Anesthesia Complications (+) PONV  Airway Mallampati: II TM Distance: >3 FB Neck ROM: full    Dental No notable dental hx. (+) Teeth Intact, Poor Dentition, Chipped and Dental Advisory Given   Pulmonary shortness of breath and at rest, pneumonia -, resolved, COPD COPD inhaler and oxygen dependent,  breath sounds clear to auscultation  Pulmonary exam normal       Cardiovascular hypertension, Pt. on medications + CAD, + Peripheral Vascular Disease and +CHF + dysrhythmias Atrial Fibrillation Rhythm:regular Rate:Normal  EF 45%.  Mod. Aortic stenosis.  Mild pulmonary htn.  Non obstructing CAD in 2005.  Diastolic CHF.  NSR and junctional bradycardia on last 2 ECGs.  Prolonged QT.   Neuro/Psych Bilateral CEA negative neurological ROS  negative psych ROS   GI/Hepatic hiatal hernia, GERD-  Medicated,(+) Hepatitis -  Endo/Other  negative endocrine ROS  Renal/GU Renal Insufficiency and CRFRenal diseaseCr. 1.72.  Acute on chronic renal disease     Musculoskeletal negative musculoskeletal ROS (+)   Abdominal   Peds  Hematology negative hematology ROS (+) Hgb. 9.5   Anesthesia Other Findings   Reproductive/Obstetrics                         Anesthesia Physical  Anesthesia Plan  ASA: III  Anesthesia Plan: General   Post-op Pain Management:    Induction: Intravenous  Airway Management Planned: Oral ETT  Additional Equipment:   Intra-op Plan:   Post-operative Plan: Extubation in OR  Informed Consent: I have reviewed the patients History and Physical, chart, labs and discussed the procedure including the risks, benefits and alternatives for the  proposed anesthesia with the patient or authorized representative who has indicated his/her understanding and acceptance.   Dental advisory given  Plan Discussed with: CRNA, Anesthesiologist and Surgeon  Anesthesia Plan Comments:       Anesthesia Quick Evaluation                                  Anesthesia Evaluation  Patient identified by MRN, date of birth, ID band Patient awake    Reviewed: Allergy & Precautions, H&P , NPO status , Patient's Chart, lab work & pertinent test results  History of Anesthesia Complications Negative for: history of anesthetic complications  Airway Mallampati: I TM Distance: >3 FB Neck ROM: full    Dental  (+) Teeth Intact, Poor Dentition, Chipped and Dental Advisory Given   Pulmonary shortness of breath and at rest, COPD         Cardiovascular hypertension, Pt. on medications + CAD and +CHF + dysrhythmias Atrial Fibrillation Rhythm:irregular Rate:Normal     Neuro/Psych Bilateral CEA; denies CVA    GI/Hepatic Neg liver ROS, hiatal hernia,   Endo/Other  negative endocrine ROS  Renal/GU negative Renal ROS     Musculoskeletal negative musculoskeletal ROS (+)   Abdominal   Peds  Hematology negative hematology ROS (+)   Anesthesia Other Findings   Reproductive/Obstetrics negative OB ROS                          Anesthesia Physical Anesthesia Plan  ASA: III  Anesthesia Plan: MAC   Post-op Pain Management:    Induction:   Airway Management Planned: Nasal Cannula  Additional Equipment:   Intra-op Plan:   Post-operative Plan:   Informed Consent: I have reviewed the patients History and Physical, chart, labs and discussed the procedure including the risks, benefits and alternatives for the proposed anesthesia with the patient or authorized representative who has indicated his/her understanding and acceptance.   Dental advisory given  Plan Discussed with: CRNA  Anesthesia Plan  Comments:         Anesthesia Quick Evaluation

## 2012-04-13 NOTE — Anesthesia Procedure Notes (Signed)
Procedure Name: Intubation Date/Time: 04/13/2012 1:39 PM Performed by: Carmela Rima Pre-anesthesia Checklist: Patient identified, Emergency Drugs available, Suction available, Patient being monitored and Timeout performed Patient Re-evaluated:Patient Re-evaluated prior to inductionOxygen Delivery Method: Circle system utilized Preoxygenation: Pre-oxygenation with 100% oxygen Intubation Type: IV induction Ventilation: Mask ventilation without difficulty Laryngoscope Size: Mac and 3 Grade View: Grade I Tube type: Oral Tube size: 7.5 mm Number of attempts: 1 Placement Confirmation: ETT inserted through vocal cords under direct vision,  positive ETCO2 and breath sounds checked- equal and bilateral Secured at: 21 cm Tube secured with: Tape Dental Injury: Teeth and Oropharynx as per pre-operative assessment

## 2012-04-13 NOTE — Clinical Social Work Psychosocial (Signed)
Clinical Social Work Department BRIEF PSYCHOSOCIAL ASSESSMENT 04/13/2012  Patient:  [name] [name], [name] [name]     Account Number:  [id number]     Admit date:  04/12/2012  Clinical Social Worker:  Johnsie Cancel  Date/Time:  04/13/2012 02:54 PM  Referred by:  Physician  Date Referred:  04/13/2012 Referred for  SNF Placement   Other Referral:   Interview type:  Family Other interview type:    PSYCHOSOCIAL DATA Living Status:  FACILITY Admitted from facility:   Level of care:  Independent Living Primary support name:  [name] [phone]) Primary support relationship to patient:  CHILD, ADULT Degree of support available:   Adequate, at patient's bedside.    CURRENT CONCERNS Current Concerns  Post-Acute Placement   Other Concerns:    SOCIAL WORK ASSESSMENT / PLAN CSW consulted to facilitate SNF placement. Patient was in surgery, and patient's daughter Avon Gully) spoke to CSW. HCPOA stated patient wanted [facility], and [facility] as a back up. HCPOA agreed to be faxed out to [facility]. CSW will follow up with bed offers to assist in discharge planning.   Assessment/plan status:  Information/Referral to [facility] Other assessment/ plan:   Information/referral to community resources:    PATIENT'S/FAMILY'S RESPONSE TO PLAN OF CARE: Patient's daughter thanked CSW for assisting in SNF placement.   Lia Foyer, LCSWA [facility] Clinical Social Worker Contact #: [phone]

## 2012-04-13 NOTE — Op Note (Signed)
04/12/2012 - 04/13/2012  5:31 PM  PATIENT:  [name] [name]  79 y.o. female  PRE-OPERATIVE DIAGNOSIS:  Left subtrochanteric femur fracture  POST-OPERATIVE DIAGNOSIS:  Same  PROCEDURE:  ORIF left subtrochanteric femur fracture with intramedullary device  SURGEON: Cliffton Asters. Janee Morn, MD  SURGICAL ASSISTANT: Gean Birchwood, MD  ANESTHESIA:  general  SPECIMENS:  None  DRAINS:   None  PREOPERATIVE INDICATIONS:  [name] [name] is a  79 y.o. female with who underwent cannulated screw fixation of a left femoral neck in early January. Yesterday, she twisted, felt a pop, and sustained a displaced and angulated subtrochanteric femur fracture. Discussion was held with her and her daughter regarding the situation and need for revision fixation of both fractures.  The risks benefits and alternatives were discussed with the patient preoperatively including but not limited to the risks of infection, bleeding, nerve injury, cardiopulmonary complications, the need for revision surgery, among others, and the patient verbalized understanding and consented to proceed.  OPERATIVE IMPLANTS: Biomet Affixus trochanteric entry nail, 11 x 3 60, with one distal dynamic screw size 90s large lag screw a size 80 "derotational" screw  OPERATIVE FINDINGS: Was placed into the lateral position, the fracture reduced anatomically. Reduction following placement of the nail was near-anatomic again with now 3 screws securing the femoral neck fracture.  OPERATIVE PROCEDURE:  After receiving prophylactic antibiotics, the patient was escorted to the operative theatre and placed in a supine position. General anesthesia was administered. A surgical "time-out" was performed during which the planned procedure, proposed operative site, and the correct patient identity were compared to the operative consent and agreement confirmed by the circulating nurse according to current facility policy. The patient was repositioned lateral with the left  side up. The left hip and leg were prepped with DuraPrep and draped in usual sterile fashion.  With the hip slightly flexed, the fracture reduced and this was confirmed fluoroscopically. 2 incisions were made, the first was smaller and was along the lateral shaft of the femur for access to the previously placed cannulated screws into the femoral neck. Additionally a larger incision was made to approach the tip of the greater trochanter. The 2 anterior screws were removed and the posterior screw remained. The guidepin was then placed onto the greater trochanter inserted in the entry reamer was used. The ball-tipped guide rod was then passed and the 12.5 mm cannulated reamer passed across the isthmus. There was a good bit of chatter at that level. 13 was passed. Nail length was measured and the nail selected and placed. Large lag screw was placed in standard fashion followed by the derotational screw. Throughout the course of this the previously placed posterior cannulated screw in the femoral neck remain and there was no change in alignment of femoral neck supple course of the procedure. The distal interlock screw was then placed in standard freehand fluoroscopically-guided fashion from lateral to medial.  Final fluoroscopic images were obtained, during which 1 view raise concern of the possibility that the derotational screw was longer than intended. It seemed to breech the bony outline of the femoral head.  Therefore, it was removed and replaced with a shorter screw. Repeat final fluoroscopic images were obtained revealing satisfactory alignment and fixation of the subtrochanteric and femoral neck fractures. The wounds were copiously irrigated and closed in layers with 0 Vicryl closing the deep fascia, 2-0 Vicryl in the skin and subcutaneous tissues and staples on the outer layer. Mepilex dressings were applied. She was taken to recovery  room in stable condition, breathing spontaneously  Disposition: Patient  will be transferred back to the floor to be cared for via the fractured hip pathway. She'll can be 25% partial weightbearing on the left lower extremity, weightbearing as tolerated and left upper extremity using a platform device for the walker and a splint. RTC 2 weeks postop for reassessment with new x-rays of the left femur and staple removal.

## 2012-04-13 NOTE — Clinical Social Work Note (Addendum)
Clinical Social Work Department CLINICAL SOCIAL WORK PLACEMENT NOTE 04/13/2012  Patient:  [name] [name], [name] [name]  Account Number:  [id number] Admit date:  04/12/2012  Clinical Social Worker:  Johnsie Cancel  Date/time:  04/13/2012 03:13 PM  Clinical Social Work is seeking post-discharge placement for this patient at the following level of care:   SKILLED NURSING   (*CSW will update this form in Epic as items are completed)   04/13/2012  Patient/family provided with [facility] Department of Clinical Social Work's list of facilities offering this level of care within the geographic area requested by the patient (or if unable, by the patient's family).  04/13/2012  Patient/family informed of their freedom to choose among providers that offer the needed level of care, that participate in Medicare, Medicaid or managed care program needed by the patient, have an available bed and are willing to accept the patient.  04/13/2012  Patient/family informed of [facility]' ownership interest in [facility], as well as of the fact that they are under no obligation to receive care at this facility.  PASARR submitted to EDS on existing # PASARR number received from EDS on existing #  FL2 transmitted to all facilities in geographic area requested by pt/family on  04/13/2012 FL2 transmitted to all facilities within larger geographic area on N/A  Patient informed that his/her managed care company has contracts with or will negotiate with  certain facilities, including the following:     Patient/family informed of bed offers received:  04/16/2012 Patient chooses bed at [facility] 04/17/2012. Patient is discharging to home then being admitted to SNF on 04/17/2012. Physician recommends and patient chooses bed at  N/A  Patient to be transferred to  on  Home on 04/16/2012 then [facility] 04/17/2012 Patient to be transferred to facility by [facility]  The following physician request were entered in  Epic:   Additional Comments: Patient wants [facility] as # 1, and [facility] as #2.  Lia Foyer, LCSWA [facility] Clinical Social Worker Contact #: [phone]

## 2012-04-13 NOTE — Progress Notes (Signed)
INITIAL NUTRITION ASSESSMENT  DOCUMENTATION CODES Per approved criteria  -Not Applicable   INTERVENTION: Supplement diet as appropriate.   NUTRITION DIAGNOSIS: Inadequate oral intake related to inability to eat as evidenced by NPO status.  Goal: Pt to meet >/= 90% of their estimated nutrition needs.   Monitor:  Diet advancement, PO intake  Reason for Assessment: Consult/Malnutrition Screening Tool  79 y.o. female  Admitting Dx: Subtrochanteric fracture of femur  ASSESSMENT: Pt has been at [facility] since discharge on 1/10 for rehab after a left hip fracture. Pt now admitted for a left subtrochanteric femur fracture. Pt is s/p repair.  Pt with hx of c. Diff and CHF.  It appears that pt has 8% weight loss hx in 5 months, however pt does have hx of CHF. Pt is currently in surgery and no family present so unable to confirm weight loss history.   Height: Ht Readings from Last 1 Encounters:  04/13/12 5\' 2"  (1.575 m)    Weight: Wt Readings from Last 1 Encounters:  04/13/12 155 lb (70.308 kg)    Ideal Body Weight: 50 kg  % Ideal Body Weight: 140%  Wt Readings from Last 10 Encounters:  04/13/12 155 lb (70.308 kg)  04/13/12 155 lb (70.308 kg)  03/29/12 154 lb 1.9 oz (69.908 kg)  03/13/12 152 lb 11.2 oz (69.264 kg)  03/13/12 152 lb 11.2 oz (69.264 kg)  02/01/12 154 lb (69.854 kg)  12/14/11 160 lb 6.4 oz (72.757 kg)  12/03/11 169 lb 8.5 oz (76.9 kg)  12/03/11 169 lb 8.5 oz (76.9 kg)  12/03/11 169 lb 8.5 oz (76.9 kg)    Usual Body Weight: 169 lb 9/13  % Usual Body Weight: 92%  BMI:  Body mass index is 28.35 kg/(m^2).  Estimated Nutritional Needs: Kcal: 1400-1600 Protein: 70-80 grams Fluid: >1.5 L/day  Skin: no issues noted  Diet Order: NPO  EDUCATION NEEDS: -No education needs identified at this time  No intake or output data in the 24 hours ending 04/13/12 1350  Last BM: PTA   Labs:   Lab 04/13/12 0627 04/12/12 1840 04/12/12 1652  NA 139 -- 137  K  3.8 -- 4.2  CL 100 -- 100  CO2 29 -- 23  BUN 15 -- 18  CREATININE 1.25* -- 1.18*  CALCIUM 8.9 -- 9.6  MG -- 2.0 --  PHOS -- -- --  GLUCOSE 102* -- 95    CBG (last 3)  No results found for this basename: GLUCAP:3 in the last 72 hours  Scheduled Meds:   . allopurinol  100 mg Oral Daily  . amiodarone  100 mg Oral Daily  . aspirin EC  325 mg Oral Daily  . atorvastatin  40 mg Oral QPM  . budesonide-formoterol  2 puff Inhalation BID  . calcium carbonate  1 tablet Oral Q breakfast  . diltiazem  30 mg Oral TID  . enoxaparin (LOVENOX) injection  40 mg Subcutaneous Q24H  . ferrous fumarate  1 tablet Oral BID  . furosemide  20 mg Oral Daily  . omega-3 acid ethyl esters  1 g Oral BID  . pantoprazole  40 mg Oral Daily  . raloxifene  60 mg Oral Daily  . saccharomyces boulardii  250 mg Oral BID    Continuous Infusions:   . sodium chloride      Past Medical History  Diagnosis Date  . Hypertension   . Hyperlipidemia   . COPD (chronic obstructive pulmonary disease)   . Osteoporosis   . Arthritis   .  H/O hiatal hernia   . Chronic back pain   . CAD (coronary artery disease)     LHC 9/05:  pLAD less than 20%, D1 20-30%, ostial RCA 40-50%, proximal RCA 20%, EF 60%.  . C. difficile diarrhea   . Gout   . Atrial fibrillation     a. failed DCCV => b. amiodarone added => converted to NSR on Amiodarone;  c. Xarelto d/c'd 2/2 falling (wrist and hip Fx in 1/14)  . Carotid stenosis     s/p bilat CEA  . C. difficile colitis     10/2011;  11/2011  . Salmonella enteritis 08/2011    c/b septic shock  . Hx of echocardiogram     a. Echocardiogram 09/29/11: Mild LVH, EF 45-50%, diffuse HK, mild to moderate aortic stenosis, mean gradient 12 mmHg, moderate MAC, mild MR, moderate LAE, moderate RAE, question small secundum ASD with left to right flow not seen in 4 chamber view, PASP 35-39 (mild pulmonary hypertension). ;  b.  TEE on 10/04/11: Mild LVH, EF 55%, mild MR, no defect or PFO  . Pneumonia  11/2101  . PONV (postoperative nausea and vomiting)   . CHF (congestive heart failure)   . Chronic kidney disease   . GERD (gastroesophageal reflux disease)   . Hepatitis     Past Surgical History  Procedure Date  . Carotid endarterectomy     bilateral  . Cataract extraction, bilateral   . Joint replacement 2008    Right Total Knee  . Anal fistula repair   . Left parotidectomy   . Eye surgery   . Flexible sigmoidoscopy 09/02/2011    Procedure: FLEXIBLE SIGMOIDOSCOPY;  Surgeon: Theda Belfast, MD;  Location: [facility] ENDOSCOPY;  Service: Endoscopy;  Laterality: N/A;  . Flexible sigmoidoscopy 09/03/2011    Procedure: FLEXIBLE SIGMOIDOSCOPY;  Surgeon: Theda Belfast, MD;  Location: [facility] ENDOSCOPY;  Service: Endoscopy;  Laterality: N/A;  . Tee without cardioversion 10/04/2011    Procedure: TRANSESOPHAGEAL ECHOCARDIOGRAM (TEE);  Surgeon: Laurey Morale, MD;  Location: [facility] ENDOSCOPY;  Service: Cardiovascular;  Laterality: N/A;  to be carelinked here by 1230-verified 7/29/dl  . Cardioversion 10/04/2011    Procedure: CARDIOVERSION;  Surgeon: Laurey Morale, MD;  Location: [facility] ENDOSCOPY;  Service: Cardiovascular;  Laterality: N/A;  . Esophagogastroduodenoscopy 11/29/2011    Procedure: ESOPHAGOGASTRODUODENOSCOPY (EGD);  Surgeon: Florencia Reasons, MD;  Location: [facility] ENDOSCOPY;  Service: Endoscopy;  Laterality: N/A;  recent h/o resp failure/pneumonia  . Left hip fracture repair 03/14/12  . Open reduction internal fixation (orif) scaphoid with distal radius graft 03/14/2012    Procedure: OPEN REDUCTION INTERNAL FIXATION (ORIF) SCAPHOID WITH DISTAL RADIUS GRAFT;  Surgeon: Jodi Marble, MD;  Location: [facility] ORS;  Service: Orthopedics;  Laterality: Left;  DVR wrist fracture set/ hand innovation  . Hip pinning,cannulated 03/14/2012    Procedure: CANNULATED HIP PINNING;  Surgeon: Jodi Marble, MD;  Location: [facility] ORS;  Service: Orthopedics;  Laterality: Left;  Biomet 6.5 cannulated screws    Kendell Bane RD, LDN,  CNSC [phone] Pager [phone] After Hours Pager

## 2012-04-13 NOTE — Preoperative (Signed)
Beta Blockers   Reason not to administer Beta Blockers:Not Applicable 

## 2012-04-13 NOTE — Progress Notes (Signed)
[facility] PROGRESS NOTE Interim History: 79 y.o. Caucasian female with history of hypertension, hyperlipidemia, COPD, coronary artery disease, atrial fibrillation on amiodarone anticoagulation discontinued in January of 2014 due to high fall risk currently on aspirin, history of Salmonella enteritis, C. difficile colitis, chronic combined systolic and diastolic heart failure, chronic kidney disease stage III, and recent history of hip fracture on the left status post surgery in January of 2014 who presents with the above complaints. Patient has currently been residing at [facility] since discharge on 03/16/2012 from the hospital for hip fracture getting rehabilitation. Patient this morning was turning to one side and felt something pop before sliding down. She denies any fall. She denies losing consciousness. She noted sharp pain in the left hip as a result she presented to Dr. Carollee Massed office with orthopedic service, imaging showed left subtrochanteric femur fracture,    Assessment/Plan: Subtrochanteric fracture of femur (04/12/2012) - surgery 2.7.2014 - mangement per surgery.\  History of atrial fibrillation  - Not on anticoagulation due to risk of falls. Rate controlled, currently in sinus. On amiodarone. - no event on telemetry QTC prolong ? Artifact. - ASA.  Chronic combined systolic and diastolic heart failure (10/06/2011) - Patient compensated. Continue furosemide.   Coronary artery disease  Continue aspirin. Stable.   Chronic kidney disease stage III  Baseline creatinine of 1.2-1.3. Appears to be at her baseline.   Osteoporosis  Start calcium with vitamin D.   Hypertension  Blood pressure elevated likely due to pain. Continue diltiazem, and furosemide.   Anemia  Likely due to chronic kidney disease and acute blood loss anemia from hip fracture. Continued to monitor. Patient has been typed and screened.    Code Status: full Family Communication: daughter  Disposition  Plan: SNF   Consultants:  Dr. Janee Morn  Procedures:  Hip repair.  Antibiotics:  None  HPI/Subjective: No complains.  Objective: Filed Vitals:   04/13/12 0000 04/13/12 0638 04/13/12 0753 04/13/12 0838  BP:  126/47    Pulse:  67    Temp:  97.3 F (36.3 C)    TempSrc:  Oral    Resp: 18 18    Height:   5\' 2"  (1.575 m)   Weight:   70.308 kg (155 lb)   SpO2: 98% 97%  96%   No intake or output data in the 24 hours ending 04/13/12 1011 Filed Weights   04/13/12 0753  Weight: 70.308 kg (155 lb)    Exam:  General: Alert, awake, oriented x3, in no acute distress.  HEENT: No bruits, no goiter.  Heart: Regular rate and rhythm, without murmurs, rubs, gallops.  Lungs: Good air movement, clear to asucultation Abdomen: Soft, nontender, nondistended, positive bowel sounds.  Neuro: Grossly intact, nonfocal.   Data Reviewed: Basic Metabolic Panel:  Lab 04/13/12 1478 04/12/12 1840 04/12/12 1652  NA 139 -- 137  K 3.8 -- 4.2  CL 100 -- 100  CO2 29 -- 23  GLUCOSE 102* -- 95  BUN 15 -- 18  CREATININE 1.25* -- 1.18*  CALCIUM 8.9 -- 9.6  MG -- 2.0 --  PHOS -- -- --   Liver Function Tests:  Lab 04/12/12 1652  AST 27  ALT 21  ALKPHOS 96  BILITOT 0.4  PROT 7.2  ALBUMIN 3.4*   No results found for this basename: LIPASE:5,AMYLASE:5 in the last 168 hours No results found for this basename: AMMONIA:5 in the last 168 hours CBC:  Lab 04/13/12 0627 04/12/12 1652  WBC 6.2 10.6*  NEUTROABS -- --  HGB 9.5* 10.8*  HCT 28.3* 32.8*  MCV 90.7 89.6  PLT 199 238   Cardiac Enzymes: No results found for this basename: CKTOTAL:5,CKMB:5,CKMBINDEX:5,TROPONINI:5 in the last 168 hours BNP (last 3 results)  Basename 11/28/11 1353 10/01/11 0718 09/29/11 0008  PROBNP 5095.0* 1563.0* 2283.0*   CBG: No results found for this basename: GLUCAP:5 in the last 168 hours  Recent Results (from the past 240 hour(s))  MRSA PCR SCREENING     Status: Normal   Collection Time   04/13/12  6:31  AM      Component Value Range Status Comment   MRSA by PCR NEGATIVE  NEGATIVE Final      Studies: Ct Femur Left Wo Contrast  04/12/2012  *RADIOLOGY REPORT*  Clinical Data: Acute left subtrochanteric femur fracture superimposed on a healing left femoral neck fracture.  Question pathologic injury.  CT OF THE LEFT FEMUR WITHOUT CONTRAST  Comparison: Plain films left hip 03/13/2012.  Findings: Three screws fixing a healing subcapital femur fracture are identified.  The screws appear well-positioned.  The patient has a new subtrochanteric left femur fracture.  There is approximately one shaft width anterior displacement and the fracture is angulated 90 degrees.  No finding to suggest neoplastic process is seen.  Imaged intrapelvic contents are unremarkable.  IMPRESSION:  1.  Acute subtrochanteric fracture has an appearance most consistent with a senile osteoporotic injury. 2.  Healing femoral neck fracture with hardware in place.   Original Report Authenticated By: Holley Dexter, M.D.    Dg Chest Portable 1 View  04/12/2012  *RADIOLOGY REPORT*  Clinical Data: Preoperative examination (left hip surgery)  PORTABLE CHEST - 1 VIEW  Comparison: 03/23/2012; 12/08/2011  Findings:  Grossly unchanged enlarged cardiac silhouette and mediastinal contours.  There is mild diffuse thickening of the interstitium. Grossly unchanged bibasilar opacities favored to represent atelectasis.  No focal airspace opacity.  No definite pleural effusion though note, the right costophrenic angle is excluded from view.  No pneumothorax.  No acute osseous abnormalities.  Surgical clips overlying the right lower neck.  IMPRESSION: Bronchitic change and bibasilar atelectasis without acute cardiopulmonary disease.   Original Report Authenticated By: Tacey Ruiz, MD     Scheduled Meds:   . allopurinol  100 mg Oral Daily  . amiodarone  100 mg Oral Daily  . aspirin EC  325 mg Oral Daily  . atorvastatin  40 mg Oral QPM  .  budesonide-formoterol  2 puff Inhalation BID  . calcium carbonate  1 tablet Oral Q breakfast  . diltiazem  30 mg Oral TID  . enoxaparin (LOVENOX) injection  40 mg Subcutaneous Q24H  . ferrous fumarate  1 tablet Oral BID  . furosemide  20 mg Oral Daily  . omega-3 acid ethyl esters  1 g Oral BID  . pantoprazole  40 mg Oral Daily  . raloxifene  60 mg Oral Daily  . saccharomyces boulardii  250 mg Oral BID   Continuous Infusions:   . sodium chloride       Marinda Elk  [facility] Pager [phone]. If 8PM-8AM, please contact night-coverage at [email or web address], password [facility] 04/13/2012, 10:11 AM  LOS: 1 day

## 2012-04-13 NOTE — Transfer of Care (Signed)
Immediate Anesthesia Transfer of Care Note  Patient: [name] [name]  Procedure(s) Performed: Procedure(s) (LRB) with comments: INTRAMEDULLARY (IM) NAIL FEMORAL (Left) - OPEN REDUCTION INTERNAL FIXATION LEFT PROXIMAL FEMUR Fracture   Patient Location: PACU  Anesthesia Type:General  Level of Consciousness: awake, alert  and oriented  Airway & Oxygen Therapy: Patient Spontanous Breathing and Patient connected to nasal cannula oxygen  Post-op Assessment: Report given to PACU RN, Post -op Vital signs reviewed and stable and Patient moving all extremities X 4  Post vital signs: Reviewed and stable  Complications: No apparent anesthesia complications

## 2012-04-13 NOTE — Interval H&P Note (Signed)
History and Physical Interval Note:  04/13/2012 1:16 PM  [name] [name]  has presented today for surgery, with the diagnosis of left proximal femur fracture  The various methods of treatment have been discussed with the patient and family. After consideration of risks, benefits and other options for treatment, the patient has consented to  Procedure(s) (LRB) with comments: INTRAMEDULLARY (IM) NAIL FEMORAL (Left) - OPEN REDUCTION INTERNAL FIXATION LEFT PROXIMAL FEMUR as a surgical intervention .  The patient's history has been reviewed, patient examined, no change in status, stable for surgery.  I have reviewed the patient's chart and labs.  Questions were answered to the patient's satisfaction.     Nestor Lewandowsky

## 2012-04-13 NOTE — Anesthesia Postprocedure Evaluation (Signed)
  Anesthesia Post-op Note  Patient: [name] [name]  Procedure(s) Performed: Procedure(s) (LRB) with comments: INTRAMEDULLARY (IM) NAIL FEMORAL (Left) - OPEN REDUCTION INTERNAL FIXATION LEFT PROXIMAL FEMUR Fracture   Patient Location: PACU  Anesthesia Type:General  Level of Consciousness: awake  Airway and Oxygen Therapy: Patient Spontanous Breathing  Post-op Pain: mild  Post-op Assessment: Post-op Vital signs reviewed  Post-op Vital Signs: stable  Complications: No apparent anesthesia complications

## 2012-04-13 NOTE — Progress Notes (Signed)
Orthopedic Tech Progress Note Patient Details:  [name] [name] [birth year] [id number]  Patient ID: [name], female   DOB: [birth year], 79 y.o.   MRN: [id number] Trapeze bar patient helper  [name] 04/13/2012, 8:21 PM

## 2012-04-14 DIAGNOSIS — D649 Anemia, unspecified: Secondary | ICD-10-CM

## 2012-04-14 LAB — BASIC METABOLIC PANEL
BUN: 18 mg/dL (ref 6–23)
Calcium: 8.3 mg/dL — ABNORMAL LOW (ref 8.4–10.5)
GFR calc Af Amer: 33 mL/min — ABNORMAL LOW (ref >90)
GFR calc non Af Amer: 28 mL/min — ABNORMAL LOW (ref >90)
Potassium: 4.2 mEq/L (ref 3.5–5.1)
Sodium: 133 mEq/L — ABNORMAL LOW (ref 135–145)

## 2012-04-14 LAB — CBC
MCHC: 32.8 g/dL (ref 30.0–36.0)
RDW: 15.6 % — ABNORMAL HIGH (ref 11.5–15.5)

## 2012-04-14 MED ORDER — DILTIAZEM HCL 30 MG PO TABS
30.0000 mg | ORAL_TABLET | Freq: Three times a day (TID) | ORAL | Status: DC
  Administered 2012-04-14 – 2012-04-16 (×7): 30 mg via ORAL
  Filled 2012-04-14 (×8): qty 1

## 2012-04-14 NOTE — Progress Notes (Signed)
Patient looks well. Minimal pain.  BP 106/39  Pulse 67  Temp(Src) 98.7 F (37.1 C) (Oral)  Resp 16  Ht 5\' 2"  (1.575 m)  Wt 70.308 kg (155 lb)  BMI 28.34 kg/m2  SpO2 100%  Patient appears comfortable Left wrist splint in place Left hip dressing in place NVI  S/p IM nail left femur - up with PT today - ASA - pain control - SCDs

## 2012-04-14 NOTE — Progress Notes (Signed)
[facility] PROGRESS NOTE Interim History: 79 y.o. Caucasian female with history of hypertension, hyperlipidemia, COPD, coronary artery disease, atrial fibrillation on amiodarone anticoagulation discontinued in January of 2014 due to high fall risk currently on aspirin, history of Salmonella enteritis, C. difficile colitis, chronic combined systolic and diastolic heart failure, chronic kidney disease stage III, and recent history of hip fracture on the left status post surgery in January of 2014 who presents with the above complaints. Patient has currently been residing at [facility] since discharge on 03/16/2012 from the hospital for hip fracture getting rehabilitation. Patient this morning was turning to one side and felt something pop before sliding down. She denies any fall. She denies losing consciousness. She noted sharp pain in the left hip as a result she presented to Dr. Carollee Massed office with orthopedic service, imaging showed left subtrochanteric femur fracture,    Assessment/Plan: Subtrochanteric fracture of femur (04/12/2012) - s/p ORIF left subtrochanteric femur fracture with intramedullary device  2.7.2014 - mangement per surgery.  History of atrial fibrillation  - Not on anticoagulation due to risk of falls. Rate controlled, currently in sinus. On amiodarone. - no event on telemetry QTC prolong ? Artifact. - ASA.  Chronic combined systolic and diastolic heart failure (10/06/2011) - Patient compensated. Continue furosemide.   Coronary artery disease  Continue aspirin. Stable.   Chronic kidney disease stage III  - Baseline creatinine of 1.2-1.3. Appears to be at her baseline.   Osteoporosis  - Start calcium with vitamin D.   Hypertension  - Blood pressure elevated likely due to pain. Continue diltiazem, and furosemide.   Anemia  - Likely due to chronic kidney disease and acute blood loss anemia from hip fracture.     Code Status: full Family Communication: daughter   Disposition Plan: SNF   Consultants:  Dr. Janee Morn  Procedures:  Hip repair.  Antibiotics:  None  HPI/Subjective: Pain on left hip with movement  Objective: Filed Vitals:   04/13/12 2004 04/14/12 0140 04/14/12 0557 04/14/12 0800  BP: 117/45 81/33 106/39   Pulse: 63 60 67   Temp: 97.9 F (36.6 C) 97.9 F (36.6 C) 98.7 F (37.1 C)   TempSrc: Oral Oral Oral   Resp: 16 16 16 16   Height:      Weight:      SpO2: 100% 100% 100% 100%    Intake/Output Summary (Last 24 hours) at 04/14/12 1223 Last data filed at 04/14/12 0549  Gross per 24 hour  Intake    820 ml  Output   1200 ml  Net   -380 ml   Filed Weights   04/13/12 0753  Weight: 70.308 kg (155 lb)    Exam:  General: Alert, awake, oriented x3, in no acute distress.  HEENT: No bruits, no goiter.  Heart: Regular rate and rhythm, without murmurs, rubs, gallops.  Lungs: Good air movement, clear to auscultation. Abdomen: Soft, nontender, nondistended, positive bowel sounds.  Neuro: Grossly intact, nonfocal.   Data Reviewed: Basic Metabolic Panel:  Recent Labs Lab 04/12/12 1652 04/12/12 1840 04/13/12 0627 04/14/12 0832  NA 137  --  139 133*  K 4.2  --  3.8 4.2  CL 100  --  100 95*  CO2 23  --  29 29  GLUCOSE 95  --  102* 102*  BUN 18  --  15 18  CREATININE 1.18*  --  1.25* 1.58*  CALCIUM 9.6  --  8.9 8.3*  MG  --  2.0  --   --  Liver Function Tests:  Recent Labs Lab 04/12/12 1652  AST 27  ALT 21  ALKPHOS 96  BILITOT 0.4  PROT 7.2  ALBUMIN 3.4*   No results found for this basename: LIPASE, AMYLASE,  in the last 168 hours No results found for this basename: AMMONIA,  in the last 168 hours CBC:  Recent Labs Lab 04/12/12 1652 04/13/12 0627 04/14/12 0832  WBC 10.6* 6.2 6.4  HGB 10.8* 9.5* 8.6*  HCT 32.8* 28.3* 26.2*  MCV 89.6 90.7 91.6  PLT 238 199 159   Cardiac Enzymes: No results found for this basename: CKTOTAL, CKMB, CKMBINDEX, TROPONINI,  in the last 168 hours BNP (last 3  results)  Recent Labs  09/29/11 0008 10/01/11 0718 11/28/11 1353  PROBNP 2283.0* 1563.0* 5095.0*   CBG: No results found for this basename: GLUCAP,  in the last 168 hours  Recent Results (from the past 240 hour(s))  MRSA PCR SCREENING     Status: None   Collection Time    04/13/12  6:31 AM      Result Value Range Status   MRSA by PCR NEGATIVE  NEGATIVE Final   Comment:            The GeneXpert MRSA Assay (FDA     approved for NASAL specimens     only), is one component of a     comprehensive MRSA colonization     surveillance program. It is not     intended to diagnose MRSA     infection nor to guide or     monitor treatment for     MRSA infections.     Studies: Dg Hip Operative Left  04/13/2012  *RADIOLOGY REPORT*  Clinical Data: ORIF subtrochanteric left femoral neck fracture.  OPERATIVE LEFT HIP 2 VIEWS 04/13/2012 (1517 hours through 1533 hours):  Comparison: Left hip x-rays 03/13/2012.  Left hip CT yesterday.  Findings: 4 spot images from the C-arm fluoroscopic device, AP and lateral views of the left hip to include part of the femur, were submitted for interpretation postoperatively.  These demonstrate an intramedullary nail traversing the subtrochanteric femoral neck fracture.  Alignment appears anatomic.  The previously placed compression screws across the prior subcapital femoral neck fracture are unchanged in position.  IMPRESSION: Near anatomic alignment post ORIF of the subtrochanteric left femoral neck fracture with an intramedullary nail.   Original Report Authenticated By: Hulan Saas, M.D.    Dg Pelvis Portable  04/13/2012  *RADIOLOGY REPORT*  Clinical Data: Status post left hip ORIF.  PORTABLE PELVIS  Comparison: Intraoperative images obtained earlier today.  Findings: Gross alignment at the level of the left hip is near anatomic.  Gamma nail, additional screws and intramedullary rod present across the subtrochanteric fracture.  IMPRESSION: Near anatomic alignment  after ORIF of the proximal left femur.   Original Report Authenticated By: Irish Lack, M.D.    Ct Femur Left Wo Contrast  04/12/2012  *RADIOLOGY REPORT*  Clinical Data: Acute left subtrochanteric femur fracture superimposed on a healing left femoral neck fracture.  Question pathologic injury.  CT OF THE LEFT FEMUR WITHOUT CONTRAST  Comparison: Plain films left hip 03/13/2012.  Findings: Three screws fixing a healing subcapital femur fracture are identified.  The screws appear well-positioned.  The patient has a new subtrochanteric left femur fracture.  There is approximately one shaft width anterior displacement and the fracture is angulated 90 degrees.  No finding to suggest neoplastic process is seen.  Imaged intrapelvic contents are unremarkable.  IMPRESSION:  1.  Acute subtrochanteric fracture has an appearance most consistent with a senile osteoporotic injury. 2.  Healing femoral neck fracture with hardware in place.   Original Report Authenticated By: Holley Dexter, M.D.    Dg Chest Portable 1 View  04/12/2012  *RADIOLOGY REPORT*  Clinical Data: Preoperative examination (left hip surgery)  PORTABLE CHEST - 1 VIEW  Comparison: 03/23/2012; 12/08/2011  Findings:  Grossly unchanged enlarged cardiac silhouette and mediastinal contours.  There is mild diffuse thickening of the interstitium. Grossly unchanged bibasilar opacities favored to represent atelectasis.  No focal airspace opacity.  No definite pleural effusion though note, the right costophrenic angle is excluded from view.  No pneumothorax.  No acute osseous abnormalities.  Surgical clips overlying the right lower neck.  IMPRESSION: Bronchitic change and bibasilar atelectasis without acute cardiopulmonary disease.   Original Report Authenticated By: Tacey Ruiz, MD     Scheduled Meds: . allopurinol  100 mg Oral Daily  . amiodarone  100 mg Oral Daily  . aspirin EC  325 mg Oral Q breakfast  . atorvastatin  40 mg Oral QPM  .  budesonide-formoterol  2 puff Inhalation BID  . calcium carbonate  1 tablet Oral Q breakfast  . diltiazem  30 mg Oral TID  . ferrous fumarate  1 tablet Oral BID  . furosemide  20 mg Oral Daily  . omega-3 acid ethyl esters  1 g Oral BID  . pantoprazole  40 mg Oral Daily  . saccharomyces boulardii  250 mg Oral BID   Continuous Infusions:    Marinda Elk  [facility] Pager [phone]. If 8PM-8AM, please contact night-coverage at [email or web address], password [facility] 04/14/2012, 12:23 PM  LOS: 2 days

## 2012-04-14 NOTE — Evaluation (Signed)
Occupational Therapy Evaluation Patient Details Name: [name] [name] MRN: [id number] DOB: November 15, 1923 Today's Date: 04/14/2012 Time: 7829-5621 OT Time Calculation (min): 21 min  OT Assessment / Plan / Recommendation Clinical Impression  This 79 yo female who sustained a left hip fx and distal radius fx in January 2014 and had just returned home from [facility], when she turned while up and refractured the left leg. She underwent ORIF and now again is PWB'ing through LLE and WB'ing only through left elbow presents to acute OT with problems below. Will benefit from acute OT with follow up at SNF.    OT Assessment  Patient needs continued OT Services    Follow Up Recommendations  SNF    Barriers to Discharge Decreased caregiver support    Equipment Recommendations  None recommended by OT       Frequency  Min 2X/week    Precautions / Restrictions Precautions Precautions: Fall Required Braces or Orthoses: Other Brace/Splint Other Brace/Splint: left wrist cock up splint Restrictions Weight Bearing Restrictions: Yes LUE Weight Bearing: Weight bear through elbow only (can use platform RW) LLE Weight Bearing: Partial weight bearing LLE Partial Weight Bearing Percentage or Pounds: 25       ADL  Eating/Feeding: Simulated;Set up Where Assessed - Eating/Feeding: Bed level Grooming: Simulated;Set up Where Assessed - Grooming: Supported sitting Upper Body Bathing: Simulated;Minimal assistance Where Assessed - Upper Body Bathing: Supported sitting Lower Body Bathing: Simulated;+1 Total assistance (with additional +1 to help maintain standing) Where Assessed - Lower Body Bathing: Supported sit to stand Upper Body Dressing: Performed;Maximal assistance Where Assessed - Upper Body Dressing: Supported sitting Lower Body Dressing: Simulated;+1 Total assistance (with additional +1 to help maintain standing) Where Assessed - Lower Body Dressing: Supported sit to Pharmacist, hospital:  Performed;+2 Total assistance Toilet Transfer: Patient Percentage: 50% Statistician Method: Ambulance person: Materials engineer and Hygiene: Simulated;+1 Total assistance (with additional +1 to help maintain standing) Equipment Used: Gait belt Transfers/Ambulation Related to ADLs: total A +2 (pt=50%) for squat pivots to good side    OT Diagnosis: Generalized weakness;Acute pain  OT Problem List: Decreased strength;Decreased activity tolerance;Pain;Impaired balance (sitting and/or standing);Decreased knowledge of precautions;Decreased knowledge of use of DME or AE OT Treatment Interventions: Self-care/ADL training;Therapeutic exercise;DME and/or AE instruction;Patient/family education;Balance training   OT Goals Acute Rehab OT Goals OT Goal Formulation: With patient Time For Goal Achievement: 04/21/12 Potential to Achieve Goals: Good ADL Goals Pt Will Transfer to Toilet: with mod assist;Squat pivot transfer;3-in-1 (going to pt's strong side) ADL Goal: Toilet Transfer - Progress: Goal set today Miscellaneous OT Goals Miscellaneous OT Goal #1: Pt will be Mod A to come up to sit EOB in prep for transfers OT Goal: Miscellaneous Goal #1 - Progress: Goal set today Miscellaneous OT Goal #2: Pt will be able to maintain squat pivot stance with Min A to help with clothing and tolieting hygiene OT Goal: Miscellaneous Goal #2 - Progress: Goal set today  Visit Information  Last OT Received On: 04/14/12 Assistance Needed: +2    Subjective Data  Subjective: I need to go to the bathroom   Prior Functioning     Home Living Lives With: Alone Available Help at Discharge: Family;Available PRN/intermittently Type of Home: Apartment Home Access: Level entry Home Layout: One level Home Adaptive Equipment: Straight cane;Bedside commode/3-in-1;Walker - four wheeled;Wheelchair - manual Additional Comments: pt moved to the [place] last fall.  Had been home about a week from [place] [facility] before she  turned and refractured the left hip. Would like to d/c to [facility] this time if possible because it is close to her daughter's home Prior Function Level of Independence: Independent with assistive device(s) Able to Take Stairs?: No Driving: No Vocation: Retired Comments: supportive family, grandson had been coming to stay with her frequently during the day and daughter helps as needed Communication Communication: No difficulties Dominant Hand: Right            Cognition  Cognition Overall Cognitive Status: Appears within functional limits for tasks assessed/performed Arousal/Alertness: Awake/alert Orientation Level: Appears intact for tasks assessed Behavior During Session: [facility] for tasks performed    Extremity/Trunk Assessment Right Upper Extremity Assessment RUE ROM/Strength/Tone: Deficits RUE ROM/Strength/Tone Deficits: right shoulder ROM limited (pre-morbid) RUE Sensation: WFL - Light Touch RUE Coordination: WFL - gross/fine motor Left Upper Extremity Assessment LUE ROM/Strength/Tone: Deficits LUE ROM/Strength/Tone Deficits: Recent distal radius fracture--tries to use it functionally but should not be putting weight through the hand LUE Sensation: WFL - Light Touch LUE Coordination: Deficits LUE Coordination Deficits: Recent distal radius fracture Right Lower Extremity Assessment RLE ROM/Strength/Tone: WFL for tasks assessed RLE Sensation: WFL - Light Touch;WFL - Proprioception RLE Coordination: WFL - gross motor Left Lower Extremity Assessment LLE ROM/Strength/Tone: Deficits LLE ROM/Strength/Tone Deficits: pt with ankle ROM WFL, hip <3/5, knee 3/5 LLE Sensation: WFL - Light Touch Trunk Assessment Trunk Assessment: Normal     Mobility Bed Mobility Bed Mobility: Sit to Supine;Scooting to HOB Sit to Supine: 1: +2 Total assist;HOB flat Sit to Supine: Patient Percentage: 10% Scooting to HOB: 1: +2 Total  assist;With trapeze Scooting to HOB: Patient Percentage: 20% Transfers Transfers: Sit to Stand;Stand to Sit Sit to Stand: 3: Mod assist;From chair/3-in-1 Stand to Sit: 3: Mod assist;With armrests;To bed Details for Transfer Assistance: Squat pivot transfer to pt's good side (total A+2) pt=50%) mainly pivoting on RLE           End of Session OT - End of Session Equipment Utilized During Treatment: Gait belt Activity Tolerance: Patient tolerated treatment well Patient left: in bed;with call bell/phone within reach Nurse Communication: Mobility status (+2 squat pivot to strong side)       Evette Georges [phone] 04/14/2012, 4:42 PM

## 2012-04-14 NOTE — Progress Notes (Signed)
Physical Therapy Evaluation Patient Details Name: [name] [name] MRN: [id number] DOB: 10-19-1923 Today's Date: 04/14/2012 Time: 1207-1240 PT Time Calculation (min): 33 min  PT Assessment / Plan / Recommendation Clinical Impression  Pt is 79 yo female who sustained a left hip fx and distal radius fx in January 2014 and had just returned home from [facility], when she turned while up and refractured the left leg. She underwent surgical fixation and will now benefit from acute PT to help her mobilize safely and begin to strengthen the left LE. Recommend d/c to SNF for further rehab, she would prefer [facility].     PT Assessment  Patient needs continued PT services    Follow Up Recommendations  SNF;Supervision/Assistance - 24 hour    Does the patient have the potential to tolerate intense rehabilitation      Barriers to Discharge None      Equipment Recommendations  None recommended by PT    Recommendations for Other Services     Frequency Min 5X/week    Precautions / Restrictions Precautions Precautions: Fall Required Braces or Orthoses: Other Brace/Splint Other Brace/Splint: left wrist splint Restrictions Weight Bearing Restrictions: Yes LUE Weight Bearing: Weight bear through elbow only LLE Weight Bearing: Partial weight bearing LLE Partial Weight Bearing Percentage or Pounds: 25   Pertinent Vitals/Pain VSS      Mobility  Bed Mobility Bed Mobility: Rolling Right;Right Sidelying to Sit;Sitting - Scoot to [place] of Bed Rolling Right: 2: Max assist Right Sidelying to Sit: 2: Max assist Sitting - Scoot to [place] of Bed: 2: Max assist Details for Bed Mobility Assistance: pad used to assist pt with bed mobility as all is painful. Left leg supported throughout.  Transfers Transfers: Sit to Stand;Stand to [employer] Transfers Sit to Stand: 2: Max assist;From bed Stand to Sit: 2: Max assist;To chair/3-in-1 Stand Pivot Transfers: 2: Max assist Details for Transfer  Assistance: Performed SPT with pt's arms on therapist's forearms and right knee between therapist's knees. Pt able to keep 25% WB'ing with this transfer, did not have to step feet.   Ambulation/Gait Ambulation/Gait Assistance: Not tested (comment) (pt not feeling well) Stairs: No Wheelchair Mobility Wheelchair Mobility: No    Exercises General Exercises - Lower Extremity Ankle Circles/Pumps: AROM;Both;10 reps;Seated Quad Sets: AROM;Both;10 reps;Seated Heel Slides: AROM;Right;5 reps;Seated Hip ABduction/ADduction: AROM;Left;5 reps;Seated Straight Leg Raises: AROM;Right;5 reps;Seated   PT Diagnosis: Difficulty walking;Generalized weakness;Acute pain;Abnormality of gait  PT Problem List: Decreased strength;Decreased range of motion;Decreased activity tolerance;Decreased balance;Decreased mobility;Pain PT Treatment Interventions: DME instruction;Gait training;Functional mobility training;Therapeutic activities;Therapeutic exercise;Balance training;Neuromuscular re-education;Patient/family education   PT Goals Acute Rehab PT Goals PT Goal Formulation: With patient Time For Goal Achievement: 04/21/12 Potential to Achieve Goals: Fair Pt will go Supine/Side to Sit: with supervision PT Goal: Supine/Side to Sit - Progress: Goal set today Pt will go Sit to Supine/Side: with supervision PT Goal: Sit to Supine/Side - Progress: Goal set today Pt will go Sit to Stand: with min assist PT Goal: Sit to Stand - Progress: Goal set today Pt will go Stand to Sit: with min assist PT Goal: Stand to Sit - Progress: Goal set today Pt will Transfer Bed to Chair/Chair to Bed: with min assist PT Transfer Goal: Bed to Chair/Chair to Bed - Progress: Goal set today Pt will Ambulate: 1 - 15 feet;with min assist;with rolling walker PT Goal: Ambulate - Progress: Goal set today  Visit Information  Last PT Received On: 04/14/12 Assistance Needed: +2 (for ambulation)    Subjective Data  Subjective: I had  progressed to 75% WB before I hurt it again Patient Stated Goal: return to rehab and eventual return to home   Prior Functioning  Home Living Lives With: Alone Available Help at Discharge: Family;Available PRN/intermittently Type of Home: Apartment Home Access: Level entry Home Layout: One level Home Adaptive Equipment: Straight cane;Bedside commode/3-in-1;Walker - four wheeled;Wheelchair - manual Additional Comments: pt moved to the [facility] last fall. Had been home about a week from [place] before she turned and refractured the left hip. Would like to d/c to [facility] this time if possible because it is close to her daughter's home Prior Function Level of Independence: Independent with assistive device(s) Able to Take Stairs?: No Driving: No Vocation: Retired Comments: supportive family. Grandson had been coming to stay with her frequently during the day and daughter helps as needed Communication Communication: No difficulties    Cognition  Cognition Overall Cognitive Status: Appears within functional limits for tasks assessed/performed Arousal/Alertness: Awake/alert Orientation Level: Oriented X4 / Intact Behavior During Session: [facility] for tasks performed    Extremity/Trunk Assessment Right Upper Extremity Assessment RUE ROM/Strength/Tone: Deficits RUE ROM/Strength/Tone Deficits: right shoulder ROM appears to be limited RUE Sensation: WFL - Light Touch RUE Coordination: WFL - gross motor Left Upper Extremity Assessment LUE ROM/Strength/Tone: Deficits LUE ROM/Strength/Tone Deficits: NT due to distal radius fx, shoulder ROM at least 60 deg LUE Sensation: WFL - Light Touch Right Lower Extremity Assessment RLE ROM/Strength/Tone: WFL for tasks assessed RLE Sensation: WFL - Light Touch;WFL - Proprioception RLE Coordination: WFL - gross motor Left Lower Extremity Assessment LLE ROM/Strength/Tone: Deficits LLE ROM/Strength/Tone Deficits: pt with ankle ROM WFL, hip <3/5,  knee 3/5 LLE Sensation: WFL - Light Touch Trunk Assessment Trunk Assessment: Normal   Balance Balance Balance Assessed: Yes Static Sitting Balance Static Sitting - Balance Support: Right upper extremity supported;Feet supported Static Sitting - Level of Assistance: 4: Min assist;3: Mod assist Static Sitting - Comment/# of Minutes: sat EOB x10 mins with heavy right lean, shifting away from left hip. With vc's, pt able to obtain more midline posture and sit with min A. Pt felt dizzy and sick when sitting up so only transferred to chair, did not attempt ambulation.   End of Session PT - End of Session Equipment Utilized During Treatment: Gait belt Activity Tolerance: Patient limited by fatigue;Patient limited by pain Patient left: in chair;with call bell/phone within reach;with family/visitor present Nurse Communication: Mobility status  GP   Lyanne Co, PT  Acute Rehab Services  [phone]   Lyanne Co 04/14/2012, 1:33 PM

## 2012-04-15 DIAGNOSIS — I10 Essential (primary) hypertension: Secondary | ICD-10-CM

## 2012-04-15 LAB — CBC
Hemoglobin: 7.9 g/dL — ABNORMAL LOW (ref 12.0–15.0)
MCHC: 33.3 g/dL (ref 30.0–36.0)
Platelets: 156 10*3/uL (ref 150–400)
RBC: 2.59 MIL/uL — ABNORMAL LOW (ref 3.87–5.11)

## 2012-04-15 LAB — BASIC METABOLIC PANEL
GFR calc Af Amer: 26 mL/min — ABNORMAL LOW (ref >90)
GFR calc non Af Amer: 22 mL/min — ABNORMAL LOW (ref >90)
Glucose, Bld: 97 mg/dL (ref 70–99)
Potassium: 3.8 mEq/L (ref 3.5–5.1)
Sodium: 131 mEq/L — ABNORMAL LOW (ref 135–145)

## 2012-04-15 NOTE — Progress Notes (Signed)
Patient doing very well, minimal pain, has been up with therapy and feels much more comfortable and stronger today. Slept well through the night and anticipating release to SNF tomorrow. Requesting [facility] as close to daughters.  BP 101/37  Pulse 64  Temp(Src) 97.9 F (36.6 C) (Oral)  Resp 16  Ht 5\' 2"  (1.575 m)  Wt 70.308 kg (155 lb)  BMI 28.34 kg/m2  SpO2 99%  Patient seated comfortably in chair, legs elevated.  Left wrist splint in place, upon removal incision healing well, non-tender to palpation, sensation intact L hand, 2+ DRP and <2 sec cap refill equal BIL in UE.  Left hip dressing in place, CDI, neg homans, no SCD's in place, 2+ DPP, <2sec cap refill BIL LE's NVI   S/P ORIF L DRFx and Cannulated Screw L Fem Neck 03/14/12, doing well  WBAT LUE with platform splint   S/p IM nail left femur 04/13/12 per Dr Janee Morn  - Cont PT, making excellent gains  - 25% PWB LLE  - ASA   - Hydrocodone for pain control   - SCDs to be utilized when pt not ambulating  -Likely d/c to SNF tomorrow/Monday   -Pt would like to go to [facility] NOT return to [facility] as [facility] is very close to    daughters

## 2012-04-15 NOTE — Discharge Summary (Signed)
Physician Discharge Summary  [name] [name] [place] ZOX:[id number] DOB: 1923-12-03 DOA: 04/12/2012  PCP: Carilyn Goodpasture, PA  Admit date: 04/12/2012 Discharge date: 04/16/2012  Time spent: 30 minutes  Recommendations for Outpatient Follow-up:  1. Follow up with ortho as an outpatient  Discharge Diagnoses:  Principal Problem:   Subtrochanteric fracture of femur Active Problems:   Hyperlipidemia   Atrial fibrillation   Chronic combined systolic and diastolic heart failure   Acute blood loss anemia   Coronary atherosclerosis of native coronary artery   Anemia   Left wrist fracture   Gout   CKD (chronic kidney disease)   Hypertension   Discharge Condition: stable  Diet recommendation: heart healthy diet  Filed Weights   04/13/12 0753  Weight: 70.308 kg (155 lb)    History of present illness:  79 y.o. Caucasian female with history of hypertension, hyperlipidemia, COPD, coronary artery disease, atrial fibrillation on amiodarone anticoagulation discontinued in January of 2014 due to high fall risk currently on aspirin, history of Salmonella enteritis, C. difficile colitis, chronic combined systolic and diastolic heart failure, chronic kidney disease stage III, and recent history of hip fracture on the left status post surgery in January of 2014 who presents with the above complaints. Patient has currently been residing at [facility] since discharge on 03/16/2012 from the hospital for hip fracture getting rehabilitation. Patient this morning was turning to one side and felt something pop before sliding down. She denies any fall. She denies losing consciousness. She noted sharp pain in the left hip as a result she presented to Dr. Carollee Massed office with orthopedic service, imaging showed left subtrochanteric femur fracture, records unavailable to me. She was transferred to the hospital for further care and management. She denies any recent fevers or chills. Denies any chest pain. Has intermittent cough  at baseline. Denies any abdominal pain or diarrhea. Denies any headaches or vision changes   Hospital Course:  Subtrochanteric fracture of femur (04/12/2012) - s/p ORIF left subtrochanteric femur fracture with intramedullary device 2.7.2014  - - Cont PT, SNF monday - 25% PWB LLE  - ASA  - Hydrocodone for pain control   History of atrial fibrillation  - Not on anticoagulation due to risk of falls. Rate controlled, currently in sinus. On amiodarone.  - ASA.   Chronic combined systolic and diastolic heart failure (10/06/2011) - Patient compensated. Continue furosemide.   Coronary artery disease  Continue aspirin. Stable.   Chronic kidney disease stage III  - Baseline creatinine of 1.2-1.3. Appears to be at her baseline.   Osteoporosis  - Start calcium with vitamin D.   Hypertension  - Blood pressure elevated likely due to pain. Continue diltiazem, and furosemide   Procedures:  s/p ORIF left subtrochanteric femur fracture with intramedullary device 2.7.2014 (i.e. Studies not automatically included, echos, thoracentesis, etc; not x-rays)  Consultations:  Dr. Janee Morn  Discharge Exam: Filed Vitals:   04/15/12 1731 04/15/12 2231 04/16/12 0345 04/16/12 0723  BP: 124/39 107/44 108/44   Pulse:  68 65   Temp:  98.5 F (36.9 C) 98.4 F (36.9 C)   TempSrc:  Oral Oral   Resp:  16 16   Height:      Weight:      SpO2:  99% 96% 97%    General: A&O x3 Cardiovascular: RRR Respiratory: good air movement CTA B/L  Discharge Instructions      Discharge Orders   Future Appointments Provider Department Dept Phone   05/04/2012 11:00 AM Kathleene Hazel, MD [facility]  Office [employer]) [phone]   Future Orders Complete By Expires     Diet - low sodium heart healthy  As directed     Diet - low sodium heart healthy  As directed     Increase activity slowly  As directed     Increase activity slowly  As directed     Partial weight bearing  As directed      Scheduling Instructions:      25% partial WB on LLE;  May WBAT L UE with platform and splint        Medication List    TAKE these medications       allopurinol 100 MG tablet  Commonly known as:  ZYLOPRIM  Take 100 mg by mouth daily.     amiodarone 400 MG tablet  Commonly known as:  PACERONE  Take 100 mg by mouth daily.     aspirin EC 325 MG tablet  Take 325 mg by mouth daily.     atorvastatin 40 MG tablet  Commonly known as:  LIPITOR  Take 40 mg by mouth every evening.     budesonide-formoterol 160-4.5 MCG/ACT inhaler  Commonly known as:  SYMBICORT  Inhale 2 puffs into the lungs 2 (two) times daily.     diltiazem 30 MG tablet  Commonly known as:  CARDIZEM  Take 30 mg by mouth 3 (three) times daily.     ferrous fumarate 325 (106 FE) MG Tabs  Commonly known as:  HEMOCYTE - 106 mg FE  Take 1 tablet by mouth 2 (two) times daily.     fish oil-omega-3 fatty acids 1000 MG capsule  Take 1 g by mouth 2 (two) times daily.     FLAX SEED OIL PO  Take 1 capsule by mouth 2 (two) times daily.     furosemide 20 MG tablet  Commonly known as:  LASIX  Take 20 mg by mouth daily.     HYDROcodone-acetaminophen 5-325 MG per tablet  Commonly known as:  NORCO/VICODIN  Take 1-2 tablets by mouth every 6 (six) hours as needed.     levalbuterol 45 MCG/ACT inhaler  Commonly known as:  XOPENEX HFA  Inhale 1-2 puffs into the lungs every 4 (four) hours as needed. Wheezing     omeprazole 20 MG capsule  Commonly known as:  PRILOSEC  Take 20 mg by mouth daily.     raloxifene 60 MG tablet  Commonly known as:  EVISTA  Take 60 mg by mouth daily.     saccharomyces boulardii 250 MG capsule  Commonly known as:  FLORASTOR  Take 250 mg by mouth 2 (two) times daily.       Follow-up Information   Schedule an appointment as soon as possible for a visit with THOMPSON, DAVID A., MD. (2 weeks following surgery)    Contact information:   [address] [place]  [address] [phone]        The results of significant diagnostics from this hospitalization (including imaging, microbiology, ancillary and laboratory) are listed below for reference.    Significant Diagnostic Studies: Dg Hip Operative Left  04/13/2012  *RADIOLOGY REPORT*  Clinical Data: ORIF subtrochanteric left femoral neck fracture.  OPERATIVE LEFT HIP 2 VIEWS 04/13/2012 (1517 hours through 1533 hours):  Comparison: Left hip x-rays 03/13/2012.  Left hip CT yesterday.  Findings: 4 spot images from the C-arm fluoroscopic device, AP and lateral views of the left hip to include part of the femur, were submitted for interpretation postoperatively.  These demonstrate an intramedullary nail traversing the subtrochanteric femoral neck fracture.  Alignment appears anatomic.  The previously placed compression screws across the prior subcapital femoral neck fracture are unchanged in position.  IMPRESSION: Near anatomic alignment post ORIF of the subtrochanteric left femoral neck fracture with an intramedullary nail.   Original Report Authenticated By: Hulan Saas, M.D.    Dg Pelvis Portable  04/13/2012  *RADIOLOGY REPORT*  Clinical Data: Status post left hip ORIF.  PORTABLE PELVIS  Comparison: Intraoperative images obtained earlier today.  Findings: Gross alignment at the level of the left hip is near anatomic.  Gamma nail, additional screws and intramedullary rod present across the subtrochanteric fracture.  IMPRESSION: Near anatomic alignment after ORIF of the proximal left femur.   Original Report Authenticated By: Irish Lack, M.D.    Ct Femur Left Wo Contrast  04/12/2012  *RADIOLOGY REPORT*  Clinical Data: Acute left subtrochanteric femur fracture superimposed on a healing left femoral neck fracture.  Question pathologic injury.  CT OF THE LEFT FEMUR WITHOUT CONTRAST  Comparison: Plain films left hip 03/13/2012.  Findings: Three screws fixing a healing subcapital femur fracture are identified.  The  screws appear well-positioned.  The patient has a new subtrochanteric left femur fracture.  There is approximately one shaft width anterior displacement and the fracture is angulated 90 degrees.  No finding to suggest neoplastic process is seen.  Imaged intrapelvic contents are unremarkable.  IMPRESSION:  1.  Acute subtrochanteric fracture has an appearance most consistent with a senile osteoporotic injury. 2.  Healing femoral neck fracture with hardware in place.   Original Report Authenticated By: Holley Dexter, M.D.    Dg Chest Portable 1 View  04/12/2012  *RADIOLOGY REPORT*  Clinical Data: Preoperative examination (left hip surgery)  PORTABLE CHEST - 1 VIEW  Comparison: 03/23/2012; 12/08/2011  Findings:  Grossly unchanged enlarged cardiac silhouette and mediastinal contours.  There is mild diffuse thickening of the interstitium. Grossly unchanged bibasilar opacities favored to represent atelectasis.  No focal airspace opacity.  No definite pleural effusion though note, the right costophrenic angle is excluded from view.  No pneumothorax.  No acute osseous abnormalities.  Surgical clips overlying the right lower neck.  IMPRESSION: Bronchitic change and bibasilar atelectasis without acute cardiopulmonary disease.   Original Report Authenticated By: Tacey Ruiz, MD     Microbiology: Recent Results (from the past 240 hour(s))  MRSA PCR SCREENING     Status: None   Collection Time    04/13/12  6:31 AM      Result Value Range Status   MRSA by PCR NEGATIVE  NEGATIVE Final   Comment:            The GeneXpert MRSA Assay (FDA     approved for NASAL specimens     only), is one component of a     comprehensive MRSA colonization     surveillance program. It is not     intended to diagnose MRSA     infection nor to guide or     monitor treatment for     MRSA infections.     Labs: Basic Metabolic Panel:  Recent Labs Lab 04/12/12 1652 04/12/12 1840 04/13/12 0627 04/14/12 0832 04/15/12 0620   NA 137  --  139 133* 131*  K 4.2  --  3.8 4.2 3.8  CL 100  --  100 95* 94*  CO2 23  --  29 29 27   GLUCOSE 95  --  102* 102* 97  BUN  18  --  15 18 23   CREATININE 1.18*  --  1.25* 1.58* 1.94*  CALCIUM 9.6  --  8.9 8.3* 8.6  MG  --  2.0  --   --   --    Liver Function Tests:  Recent Labs Lab 04/12/12 1652  AST 27  ALT 21  ALKPHOS 96  BILITOT 0.4  PROT 7.2  ALBUMIN 3.4*   No results found for this basename: LIPASE, AMYLASE,  in the last 168 hours No results found for this basename: AMMONIA,  in the last 168 hours CBC:  Recent Labs Lab 04/12/12 1652 04/13/12 0627 04/14/12 0832 04/15/12 0620 04/16/12 0749  WBC 10.6* 6.2 6.4 6.1 5.8  HGB 10.8* 9.5* 8.6* 7.9* 8.1*  HCT 32.8* 28.3* 26.2* 23.7* 24.2*  MCV 89.6 90.7 91.6 91.5 92.4  PLT 238 199 159 156 195   Cardiac Enzymes: No results found for this basename: CKTOTAL, CKMB, CKMBINDEX, TROPONINI,  in the last 168 hours BNP: BNP (last 3 results)  Recent Labs  09/29/11 0008 10/01/11 0718 11/28/11 1353  PROBNP 2283.0* 1563.0* 5095.0*   CBG: No results found for this basename: GLUCAP,  in the last 168 hours   Signed:  Marinda Elk  [facility] 04/16/2012, 9:53 AM

## 2012-04-15 NOTE — Progress Notes (Signed)
[facility] PROGRESS NOTE Interim History: 79 y.o. Caucasian female with history of hypertension, hyperlipidemia, COPD, coronary artery disease, atrial fibrillation on amiodarone anticoagulation discontinued in January of 2014 due to high fall risk currently on aspirin, history of Salmonella enteritis, C. difficile colitis, chronic combined systolic and diastolic heart failure, chronic kidney disease stage III, and recent history of hip fracture on the left status post surgery in January of 2014 who presents with the above complaints. Patient has currently been residing at [facility] since discharge on 03/16/2012 from the hospital for hip fracture getting rehabilitation. Patient this morning was turning to one side and felt something pop before sliding down. She denies any fall. She denies losing consciousness. She noted sharp pain in the left hip as a result she presented to Dr. Carollee Massed office with orthopedic service, imaging showed left subtrochanteric femur fracture,    Assessment/Plan: Subtrochanteric fracture of femur (04/12/2012) - s/p ORIF left subtrochanteric femur fracture with intramedullary device  2.7.2014 - - Cont PT, making excellent gains  - 25% PWB LLE  - ASA  - Hydrocodone for pain control  - SCDs to be utilized when pt not ambulating - mangement per surgery.  History of atrial fibrillation  - Not on anticoagulation due to risk of falls. Rate controlled, currently in sinus. On amiodarone. - ASA.  Chronic combined systolic and diastolic heart failure (10/06/2011) - Patient compensated. Continue furosemide.   Coronary artery disease  Continue aspirin. Stable.   Chronic kidney disease stage III  - Baseline creatinine of 1.2-1.3. Appears to be at her baseline.   Osteoporosis  - Start calcium with vitamin D.   Hypertension  - Blood pressure elevated likely due to pain. Continue diltiazem, and furosemide.   Anemia  - Likely due to chronic kidney disease and acute  blood loss anemia from hip fracture.     Code Status: full Family Communication: daughter  Disposition Plan: SNF   Consultants:  Dr. Janee Morn  Procedures:  Hip repair.  Antibiotics:  None  HPI/Subjective: No complains  Objective: Filed Vitals:   04/14/12 1600 04/14/12 1953 04/15/12 0545 04/15/12 0925  BP:  99/35 101/37   Pulse:  67 64   Temp:  98.5 F (36.9 C) 97.9 F (36.6 C)   TempSrc:  Oral Oral   Resp: 16 16 16    Height:      Weight:      SpO2: 100% 96% 100% 99%    Intake/Output Summary (Last 24 hours) at 04/15/12 1059 Last data filed at 04/15/12 0400  Gross per 24 hour  Intake    120 ml  Output    500 ml  Net   -380 ml   Filed Weights   04/13/12 0753  Weight: 70.308 kg (155 lb)    Exam:  General: Alert, awake, oriented x3, in no acute distress.  HEENT: No bruits, no goiter.  Heart: Regular rate and rhythm, without murmurs, rubs, gallops.  Lungs: Good air movement, clear to auscultation. Abdomen: Soft, nontender, nondistended, positive bowel sounds.  Neuro: Grossly intact, nonfocal.   Data Reviewed: Basic Metabolic Panel:  Recent Labs Lab 04/12/12 1652 04/12/12 1840 04/13/12 0627 04/14/12 0832 04/15/12 0620  NA 137  --  139 133* 131*  K 4.2  --  3.8 4.2 3.8  CL 100  --  100 95* 94*  CO2 23  --  29 29 27   GLUCOSE 95  --  102* 102* 97  BUN 18  --  15 18 23   CREATININE 1.18*  --  1.25* 1.58* 1.94*  CALCIUM 9.6  --  8.9 8.3* 8.6  MG  --  2.0  --   --   --    Liver Function Tests:  Recent Labs Lab 04/12/12 1652  AST 27  ALT 21  ALKPHOS 96  BILITOT 0.4  PROT 7.2  ALBUMIN 3.4*   No results found for this basename: LIPASE, AMYLASE,  in the last 168 hours No results found for this basename: AMMONIA,  in the last 168 hours CBC:  Recent Labs Lab 04/12/12 1652 04/13/12 0627 04/14/12 0832 04/15/12 0620  WBC 10.6* 6.2 6.4 6.1  HGB 10.8* 9.5* 8.6* 7.9*  HCT 32.8* 28.3* 26.2* 23.7*  MCV 89.6 90.7 91.6 91.5  PLT 238 199 159  156   Cardiac Enzymes: No results found for this basename: CKTOTAL, CKMB, CKMBINDEX, TROPONINI,  in the last 168 hours BNP (last 3 results)  Recent Labs  09/29/11 0008 10/01/11 0718 11/28/11 1353  PROBNP 2283.0* 1563.0* 5095.0*   CBG: No results found for this basename: GLUCAP,  in the last 168 hours  Recent Results (from the past 240 hour(s))  MRSA PCR SCREENING     Status: None   Collection Time    04/13/12  6:31 AM      Result Value Range Status   MRSA by PCR NEGATIVE  NEGATIVE Final   Comment:            The GeneXpert MRSA Assay (FDA     approved for NASAL specimens     only), is one component of a     comprehensive MRSA colonization     surveillance program. It is not     intended to diagnose MRSA     infection nor to guide or     monitor treatment for     MRSA infections.     Studies: Dg Hip Operative Left  04/13/2012  *RADIOLOGY REPORT*  Clinical Data: ORIF subtrochanteric left femoral neck fracture.  OPERATIVE LEFT HIP 2 VIEWS 04/13/2012 (1517 hours through 1533 hours):  Comparison: Left hip x-rays 03/13/2012.  Left hip CT yesterday.  Findings: 4 spot images from the C-arm fluoroscopic device, AP and lateral views of the left hip to include part of the femur, were submitted for interpretation postoperatively.  These demonstrate an intramedullary nail traversing the subtrochanteric femoral neck fracture.  Alignment appears anatomic.  The previously placed compression screws across the prior subcapital femoral neck fracture are unchanged in position.  IMPRESSION: Near anatomic alignment post ORIF of the subtrochanteric left femoral neck fracture with an intramedullary nail.   Original Report Authenticated By: Hulan Saas, M.D.    Dg Pelvis Portable  04/13/2012  *RADIOLOGY REPORT*  Clinical Data: Status post left hip ORIF.  PORTABLE PELVIS  Comparison: Intraoperative images obtained earlier today.  Findings: Gross alignment at the level of the left hip is near anatomic.   Gamma nail, additional screws and intramedullary rod present across the subtrochanteric fracture.  IMPRESSION: Near anatomic alignment after ORIF of the proximal left femur.   Original Report Authenticated By: Irish Lack, M.D.     Scheduled Meds: . allopurinol  100 mg Oral Daily  . amiodarone  100 mg Oral Daily  . aspirin EC  325 mg Oral Q breakfast  . atorvastatin  40 mg Oral QPM  . budesonide-formoterol  2 puff Inhalation BID  . calcium carbonate  1 tablet Oral Q breakfast  . diltiazem  30 mg Oral TID  . ferrous fumarate  1 tablet Oral  BID  . furosemide  20 mg Oral Daily  . omega-3 acid ethyl esters  1 g Oral BID  . pantoprazole  40 mg Oral Daily  . saccharomyces boulardii  250 mg Oral BID   Continuous Infusions:    Marinda Elk  [facility] Pager [phone]. If 8PM-8AM, please contact night-coverage at [email or web address], password [facility] 04/15/2012, 10:59 AM  LOS: 3 days

## 2012-04-15 NOTE — Progress Notes (Signed)
Physical Therapy Treatment Patient Details Name: [name] [name] MRN: [id number] DOB: 15-Mar-1923 Today's Date: 04/15/2012 Time: 4098-1191 PT Time Calculation (min): 16 min  PT Assessment / Plan / Recommendation Comments on Treatment Session  Pt reports pain better controlled today but still only able to achieve bed>recliner.      Follow Up Recommendations  SNF;Supervision/Assistance - 24 hour     Does the patient have the potential to tolerate intense rehabilitation     Barriers to Discharge        Equipment Recommendations  None recommended by PT    Recommendations for Other Services    Frequency Min 5X/week   Plan Discharge plan remains appropriate    Precautions / Restrictions Precautions Precautions: Fall Required Braces or Orthoses: Other Brace/Splint Other Brace/Splint: left wrist cock up splint Restrictions Weight Bearing Restrictions: Yes LUE Weight Bearing: Weight bear through elbow only LLE Weight Bearing: Partial weight bearing LLE Partial Weight Bearing Percentage or Pounds: 25   Pertinent Vitals/Pain 8/10 Lt LE.  Premedicated.      Mobility  Bed Mobility Bed Mobility: Supine to Sit;Sitting - Scoot to Edge of Bed Supine to Sit: 2: Max assist;With rails Sitting - Scoot to [place] of Bed: 2: Max assist Details for Bed Mobility Assistance: (A) for LE's & to bring shoulders/trunk to sitting upright.  Cues for technique.  Use of draw pad to pivot hips/LE's around to EOB & to scoot closer to EOB.  Increased time to allow pt to perform as (I)'ly as possible but (I) limited due to pain & UE restriction   Transfers Transfers: Sit to Stand;Stand to Sit;Stand Pivot Transfers Sit to Stand: 1: +2 Total assist;With upper extremity assist;From bed Sit to Stand: Patient Percentage: 50% Stand to Sit: 3: Mod assist;With armrests;To chair/3-in-1 Stand Pivot Transfers: 3: Mod assist Details for Transfer Assistance: Used PFRW.  (A) to achieve standing, balance, RW management during  pivot, & controlled descent.  Pt unable to take steps for pivot but "shimmied" her foot from bed>recliner.   Ambulation/Gait Ambulation/Gait Assistance: Not tested (comment)      PT Goals Acute Rehab PT Goals Time For Goal Achievement: 04/21/12 Potential to Achieve Goals: Fair Pt will go Supine/Side to Sit: with supervision PT Goal: Supine/Side to Sit - Progress: Not met Pt will go Sit to Supine/Side: with supervision Pt will go Sit to Stand: with min assist PT Goal: Sit to Stand - Progress: Not met Pt will go Stand to Sit: with min assist PT Goal: Stand to Sit - Progress: Progressing toward goal Pt will Transfer Bed to Chair/Chair to Bed: with min assist PT Transfer Goal: Bed to Chair/Chair to Bed - Progress: Progressing toward goal Pt will Ambulate: 1 - 15 feet;with min assist;with rolling walker  Visit Information  Last PT Received On: 04/15/12 Assistance Needed: +2    Subjective Data      Cognition  Cognition Overall Cognitive Status: Appears within functional limits for tasks assessed/performed Arousal/Alertness: Awake/alert Orientation Level: Appears intact for tasks assessed Behavior During Session: [facility] for tasks performed    Balance     End of Session PT - End of Session Equipment Utilized During Treatment: Gait belt Activity Tolerance: Patient limited by fatigue Patient left: in chair Nurse Communication: Mobility status     Verdell Face, [place] [phone] 04/15/2012

## 2012-04-16 ENCOUNTER — Telehealth: Payer: Self-pay | Admitting: Cardiovascular Disease

## 2012-04-16 LAB — CBC
Hemoglobin: 8.1 g/dL — ABNORMAL LOW (ref 12.0–15.0)
MCH: 30.9 pg (ref 26.0–34.0)
Platelets: 195 10*3/uL (ref 150–400)
RBC: 2.62 MIL/uL — ABNORMAL LOW (ref 3.87–5.11)
WBC: 5.8 10*3/uL (ref 4.0–10.5)

## 2012-04-16 MED ORDER — SODIUM CHLORIDE 0.9 % IV BOLUS (SEPSIS): 500.0000 mL | Freq: Once | INTRAVENOUS | Status: DC

## 2012-04-16 NOTE — Progress Notes (Signed)
Physical Therapy Treatment Patient Details Name: [name] [name] MRN: [id number] DOB: 1923/07/18 Today's Date: 04/16/2012 Time: 4098-1191 PT Time Calculation (min): 16 min  PT Assessment / Plan / Recommendation Comments on Treatment Session  Patient is planning on going home tonight via ambulance then being transfered to SNF tomorrow morning by ambulance. Patient and daughter feel comfortable with this plan and with transferring patient once at home.     Follow Up Recommendations  SNF     Does the patient have the potential to tolerate intense rehabilitation     Barriers to Discharge        Equipment Recommendations  None recommended by PT    Recommendations for Other Services    Frequency Min 5X/week   Plan Discharge plan remains appropriate;Frequency remains appropriate    Precautions / Restrictions Precautions Precautions: Fall Required Braces or Orthoses: Other Brace/Splint Other Brace/Splint: left wrist cock up splint Restrictions Weight Bearing Restrictions: Yes LUE Weight Bearing: Weight bearing as tolerated LLE Weight Bearing: Partial weight bearing LLE Partial Weight Bearing Percentage or Pounds: 25   Pertinent Vitals/Pain     Mobility  Bed Mobility Rolling Right: 3: Mod assist;With rail Right Sidelying to Sit: 3: Mod assist;With rails Details for Bed Mobility Assistance: A for shoulders into upright sitting. Cues not to use L hand to push up. Patient able to get LEs up off of bed Transfers Sit to Stand: 3: Mod assist;With upper extremity assist;From bed Stand to Sit: 4: Min assist;With upper extremity assist;To chair/3-in-1 Stand Pivot Transfers: 3: Mod assist;With armrests Details for Transfer Assistance: Cues for safe technique and positoning. Cues for hand placement and for positoning and RW with transfer Ambulation/Gait Ambulation/Gait Assistance: Not tested (comment)    Exercises     PT Diagnosis:    PT Problem List:   PT Treatment Interventions:      PT Goals Acute Rehab PT Goals PT Goal: Supine/Side to Sit - Progress: Progressing toward goal PT Goal: Sit to Stand - Progress: Progressing toward goal PT Goal: Stand to Sit - Progress: Progressing toward goal PT Transfer Goal: Bed to Chair/Chair to Bed - Progress: Progressing toward goal  Visit Information  Last PT Received On: 04/16/12    Subjective Data      Cognition  Cognition Overall Cognitive Status: Appears within functional limits for tasks assessed/performed Arousal/Alertness: Awake/alert Orientation Level: Appears intact for tasks assessed Behavior During Session: [facility] for tasks performed    Balance     End of Session PT - End of Session Equipment Utilized During Treatment: Gait belt Activity Tolerance: Patient tolerated treatment well Patient left: in chair Nurse Communication: Mobility status   GP     Fredrich Birks 04/16/2012, 2:42 PM 04/16/2012 Fredrich Birks PTA [phone] pager [phone] office

## 2012-04-16 NOTE — Progress Notes (Signed)
Utilization review completed. Shaivi Rothschild, RN, BSN. 

## 2012-04-16 NOTE — Clinical Social Work Note (Signed)
CSW was consulted to complete discharge of patient. Pt to transfer to home today via daughter then will be admitted to [facility] 04/17/2012. Facility and family are aware of d/c. D/C packet complete with chart copy, signed FL2, and signed hard Rx.  CSW signing off as no other CSW needs identified at this time.  Lia Foyer, LCSWA [facility] Clinical Social Worker Contact #: [phone]

## 2012-04-16 NOTE — Telephone Encounter (Signed)
Left message to call back  

## 2012-04-16 NOTE — Progress Notes (Signed)
Pt states she was feeling funny. Pt has hear history of "episodes", wears a heart monitor at home.  Her baseline remains stable.  Vitals were checked,MD called of pts concerns.  MD states she is able to discharge.  Pt will be monitored until discharge.

## 2012-04-16 NOTE — Progress Notes (Signed)
Subjective: Patient doing very well, minimal resting pain.  Burping some, scant flatus by report, No BM yet.  Requesting [facility] as close to daughters.    Objective: Vital signs in last 24 hours: Temp:  [97.9 F (36.6 C)-98.5 F (36.9 C)] 98.4 F (36.9 C) (02/10 0345) Pulse Rate:  [57-68] 65 (02/10 0345) Resp:  [16-18] 16 (02/10 0345) BP: (97-124)/(32-44) 108/44 mmHg (02/10 0345) SpO2:  [96 %-100 %] 97 % (02/10 0723)  Intake/Output from previous day: 02/09 0701 - 02/10 0700 In: 720 [P.O.:720] Out: -  Intake/Output this shift:     Recent Labs  04/14/12 0832 04/15/12 0620 04/16/12 0749  HGB 8.6* 7.9* 8.1*    Recent Labs  04/15/12 0620 04/16/12 0749  WBC 6.1 5.8  RBC 2.59* 2.62*  HCT 23.7* 24.2*  PLT 156 195    Recent Labs  04/14/12 0832 04/15/12 0620  NA 133* 131*  K 4.2 3.8  CL 95* 94*  CO2 29 27  BUN 18 23  CREATININE 1.58* 1.94*  GLUCOSE 102* 97  CALCIUM 8.3* 8.6   No results found for this basename: LABPT, INR,  in the last 72 hours  incisions clean, no active drainage, NVI  Assessment/Plan: ASA for VTE proph 25% PWB LLE, WBAT LUE with platform & wrist splint Norco for pain OK per ortho for d/c today RTC 2 weeks   Tanai Bouler A. 04/16/2012, 11:15 AM

## 2012-04-16 NOTE — Telephone Encounter (Signed)
Spoke with pt's daughter. Pt has been in hospital with broken femur. She had surgery on 2/7.  She is being discharged today. I instructed daughter to have pt put monitor back on after discharge and send in as planned on 2/18.

## 2012-04-16 NOTE — Telephone Encounter (Signed)
New Problem:    Patient's daughter called in because the patient has been hospitalized since 04/12/12, due to breaking a bone, and has not worn her monitor since.  Would like to know how to proceed.  Please call back.

## 2012-04-17 ENCOUNTER — Encounter (HOSPITAL_COMMUNITY): Payer: Self-pay | Admitting: *Deleted

## 2012-04-17 ENCOUNTER — Telehealth: Payer: Self-pay | Admitting: Cardiovascular Disease

## 2012-04-17 ENCOUNTER — Emergency Department (HOSPITAL_COMMUNITY): Payer: Medicare Other

## 2012-04-17 ENCOUNTER — Emergency Department (HOSPITAL_COMMUNITY)
Admission: EM | Admit: 2012-04-17 | Discharge: 2012-04-17 | Disposition: A | Discharge status: Skilled Nursing Facility | Payer: Medicare Other | Attending: Emergency Medicine | Admitting: Emergency Medicine

## 2012-04-17 DIAGNOSIS — K219 Gastro-esophageal reflux disease without esophagitis: Secondary | ICD-10-CM | POA: Insufficient documentation

## 2012-04-17 DIAGNOSIS — Z8719 Personal history of other diseases of the digestive system: Secondary | ICD-10-CM | POA: Insufficient documentation

## 2012-04-17 DIAGNOSIS — Z9889 Other specified postprocedural states: Secondary | ICD-10-CM | POA: Insufficient documentation

## 2012-04-17 DIAGNOSIS — N189 Chronic kidney disease, unspecified: Secondary | ICD-10-CM | POA: Insufficient documentation

## 2012-04-17 DIAGNOSIS — R42 Dizziness and giddiness: Secondary | ICD-10-CM | POA: Insufficient documentation

## 2012-04-17 DIAGNOSIS — I509 Heart failure, unspecified: Secondary | ICD-10-CM | POA: Insufficient documentation

## 2012-04-17 DIAGNOSIS — R112 Nausea with vomiting, unspecified: Secondary | ICD-10-CM | POA: Insufficient documentation

## 2012-04-17 DIAGNOSIS — J4489 Other specified chronic obstructive pulmonary disease: Secondary | ICD-10-CM | POA: Insufficient documentation

## 2012-04-17 DIAGNOSIS — G8918 Other acute postprocedural pain: Secondary | ICD-10-CM | POA: Insufficient documentation

## 2012-04-17 DIAGNOSIS — E785 Hyperlipidemia, unspecified: Secondary | ICD-10-CM | POA: Insufficient documentation

## 2012-04-17 DIAGNOSIS — Z8639 Personal history of other endocrine, nutritional and metabolic disease: Secondary | ICD-10-CM | POA: Insufficient documentation

## 2012-04-17 DIAGNOSIS — M79605 Pain in left leg: Secondary | ICD-10-CM

## 2012-04-17 DIAGNOSIS — I129 Hypertensive chronic kidney disease with stage 1 through stage 4 chronic kidney disease, or unspecified chronic kidney disease: Secondary | ICD-10-CM | POA: Insufficient documentation

## 2012-04-17 DIAGNOSIS — Z8701 Personal history of pneumonia (recurrent): Secondary | ICD-10-CM | POA: Insufficient documentation

## 2012-04-17 DIAGNOSIS — Z7982 Long term (current) use of aspirin: Secondary | ICD-10-CM | POA: Insufficient documentation

## 2012-04-17 DIAGNOSIS — I2510 Atherosclerotic heart disease of native coronary artery without angina pectoris: Secondary | ICD-10-CM | POA: Insufficient documentation

## 2012-04-17 DIAGNOSIS — R002 Palpitations: Secondary | ICD-10-CM | POA: Insufficient documentation

## 2012-04-17 DIAGNOSIS — Z8781 Personal history of (healed) traumatic fracture: Secondary | ICD-10-CM

## 2012-04-17 DIAGNOSIS — Z8679 Personal history of other diseases of the circulatory system: Secondary | ICD-10-CM | POA: Insufficient documentation

## 2012-04-17 DIAGNOSIS — J449 Chronic obstructive pulmonary disease, unspecified: Secondary | ICD-10-CM | POA: Insufficient documentation

## 2012-04-17 DIAGNOSIS — Z8739 Personal history of other diseases of the musculoskeletal system and connective tissue: Secondary | ICD-10-CM | POA: Insufficient documentation

## 2012-04-17 DIAGNOSIS — Z79899 Other long term (current) drug therapy: Secondary | ICD-10-CM | POA: Insufficient documentation

## 2012-04-17 DIAGNOSIS — Z862 Personal history of diseases of the blood and blood-forming organs and certain disorders involving the immune mechanism: Secondary | ICD-10-CM | POA: Insufficient documentation

## 2012-04-17 DIAGNOSIS — M79609 Pain in unspecified limb: Secondary | ICD-10-CM | POA: Insufficient documentation

## 2012-04-17 DIAGNOSIS — Z8619 Personal history of other infectious and parasitic diseases: Secondary | ICD-10-CM | POA: Insufficient documentation

## 2012-04-17 DIAGNOSIS — Z87891 Personal history of nicotine dependence: Secondary | ICD-10-CM | POA: Insufficient documentation

## 2012-04-17 LAB — TYPE AND SCREEN
ABO/RH(D): O POS
Antibody Screen: POSITIVE
DAT, IgG: NEGATIVE
Donor AG Type: NEGATIVE
Donor AG Type: NEGATIVE
Unit division: 0
Unit division: 0
Unit division: 0

## 2012-04-17 MED ORDER — ONDANSETRON HCL 4 MG/2ML IJ SOLN
4.0000 mg | Freq: Once | INTRAMUSCULAR | Status: AC
  Administered 2012-04-17: 4 mg via INTRAVENOUS
  Filled 2012-04-17: qty 2

## 2012-04-17 MED ORDER — FENTANYL CITRATE 0.05 MG/ML IJ SOLN
50.0000 ug | INTRAMUSCULAR | Status: DC | PRN
  Administered 2012-04-17 (×3): 50 ug via INTRAVENOUS
  Filled 2012-04-17 (×2): qty 2

## 2012-04-17 NOTE — Telephone Encounter (Signed)
Spoke with Victorino Dike at [facility]. She was calling to let us know pt will continue to wear heart monitor.

## 2012-04-17 NOTE — ED Provider Notes (Signed)
I saw and evaluated the patient, reviewed the resident's note and I agree with the findings and plan.   .Face to face Exam:  General:  Awake HEENT:  Atraumatic Resp:  Normal effort Abd:  Nondistended Neuro:No focal weakness Lymph: No adenopathy   Nelia Shi, MD 04/17/12 1059

## 2012-04-17 NOTE — ED Notes (Signed)
Pt here per [facility] with report of left leg pain.  Pt advises after she woke up this am and was trying to get out of bed, she heard a "pop" in left femur/hip area and had a sharp pain.  Pt had ORIF 04-13-2012 of left femur at this facility. Pt transferred from SNF.

## 2012-04-17 NOTE — Telephone Encounter (Signed)
[facility] calling to see if pt is still wearing heart monitor , has fallen again and going back into rehab a [facility]

## 2012-04-17 NOTE — Telephone Encounter (Signed)
Spoke with pt's daughter. Pt went home yesterday and is back in ED today. She is going to rehab facility later today. Daughter will have pt put monitor back on when discharged from ED and wear until 2/21.

## 2012-04-17 NOTE — ED Provider Notes (Signed)
History     CSN: [id number]  Arrival date & time 04/17/12  4098   First MD Initiated Contact with Patient 04/17/12 [phone]      Chief Complaint  Patient presents with  . Leg Pain    HPI Pt is a 79 yo F with extensive PMH including left hip fracture repair in January 2014 as well as ORIF left subtrochanteric femur fracture with intramedullary device last week who is presenting to ED with complaint of hearing a "pop" of left hip with pain. Patient was d/c home from hospital yesterday because SNF did not have a bed available until today. She states she attempted to stand up from her bed when she heard the pop. She continues to have pain in her left hip and leg, which is better if she is lying still and worse with any movement. She has not taken anything for pain. She denies CP, but is wearing a monitor because her "heart acts funny." Her daughter states pt has also had increased nausea with a few episodes of vomiting overnight.   Past Medical History  Diagnosis Date  . Hypertension   . Hyperlipidemia   . COPD (chronic obstructive pulmonary disease)   . Osteoporosis   . Arthritis   . H/O hiatal hernia   . Chronic back pain   . CAD (coronary artery disease)     LHC 9/05:  pLAD less than 20%, D1 20-30%, ostial RCA 40-50%, proximal RCA 20%, EF 60%.  . C. difficile diarrhea   . Gout   . Atrial fibrillation     a. failed DCCV => b. amiodarone added => converted to NSR on Amiodarone;  c. Xarelto d/c'd 2/2 falling (wrist and hip Fx in 1/14)  . Carotid stenosis     s/p bilat CEA  . C. difficile colitis     10/2011;  11/2011  . Salmonella enteritis 08/2011    c/b septic shock  . Hx of echocardiogram     a. Echocardiogram 09/29/11: Mild LVH, EF 45-50%, diffuse HK, mild to moderate aortic stenosis, mean gradient 12 mmHg, moderate MAC, mild MR, moderate LAE, moderate RAE, question small secundum ASD with left to right flow not seen in 4 chamber view, PASP 35-39 (mild pulmonary hypertension). ;  b.   TEE on 10/04/11: Mild LVH, EF 55%, mild MR, no defect or PFO  . Pneumonia 11/2101  . PONV (postoperative nausea and vomiting)   . CHF (congestive heart failure)   . Chronic kidney disease   . GERD (gastroesophageal reflux disease)   . Hepatitis     Past Surgical History  Procedure Laterality Date  . Carotid endarterectomy      bilateral  . Cataract extraction, bilateral    . Joint replacement  2008    Right Total Knee  . Anal fistula repair    . Left parotidectomy    . Eye surgery    . Flexible sigmoidoscopy  09/02/2011    Procedure: FLEXIBLE SIGMOIDOSCOPY;  Surgeon: Theda Belfast, MD;  Location: [facility] ENDOSCOPY;  Service: Endoscopy;  Laterality: N/A;  . Flexible sigmoidoscopy  09/03/2011    Procedure: FLEXIBLE SIGMOIDOSCOPY;  Surgeon: Theda Belfast, MD;  Location: [facility] ENDOSCOPY;  Service: Endoscopy;  Laterality: N/A;  . Tee without cardioversion  10/04/2011    Procedure: TRANSESOPHAGEAL ECHOCARDIOGRAM (TEE);  Surgeon: Laurey Morale, MD;  Location: [facility] ENDOSCOPY;  Service: Cardiovascular;  Laterality: N/A;  to be carelinked here by 1230-verified 7/29/dl  . Cardioversion  10/04/2011    Procedure:  CARDIOVERSION;  Surgeon: Laurey Morale, MD;  Location: [facility] ENDOSCOPY;  Service: Cardiovascular;  Laterality: N/A;  . Esophagogastroduodenoscopy  11/29/2011    Procedure: ESOPHAGOGASTRODUODENOSCOPY (EGD);  Surgeon: Florencia Reasons, MD;  Location: [facility] ENDOSCOPY;  Service: Endoscopy;  Laterality: N/A;  recent h/o resp failure/pneumonia  . Left hip fracture repair  03/14/12  . Open reduction internal fixation (orif) scaphoid with distal radius graft  03/14/2012    Procedure: OPEN REDUCTION INTERNAL FIXATION (ORIF) SCAPHOID WITH DISTAL RADIUS GRAFT;  Surgeon: Jodi Marble, MD;  Location: [facility] ORS;  Service: Orthopedics;  Laterality: Left;  DVR wrist fracture set/ hand innovation  . Hip pinning,cannulated  03/14/2012    Procedure: CANNULATED HIP PINNING;  Surgeon: Jodi Marble, MD;  Location: [facility] ORS;   Service: Orthopedics;  Laterality: Left;  Biomet 6.5 cannulated screws    Family History  Problem Relation Age of Onset  . Hemochromatosis Sister   . Diabetes type II Mother     History  Substance Use Topics  . Smoking status: Former Smoker -- 1.00 packs/day for 20 years    Types: Cigarettes    Quit date: 09/01/1983  . Smokeless tobacco: Never Used  . Alcohol Use: No    OB History   Grav Para Term Preterm Abortions TAB SAB Ect Mult Living                  Review of Systems  Constitutional: Positive for activity change. Negative for fever and chills.  Respiratory: Negative for cough and shortness of breath.   Cardiovascular: Positive for palpitations. Negative for chest pain.  Gastrointestinal: Positive for nausea and vomiting. Negative for abdominal pain.  Genitourinary: Negative for difficulty urinating.  Musculoskeletal: Positive for arthralgias and gait problem.  Skin: Positive for wound.  Neurological: Positive for light-headedness. Negative for syncope and headaches.  All other systems reviewed and are negative.    Allergies  Baby powder; Demerol; and Penicillins  Home Medications   Current Outpatient Rx  Name  Route  Sig  Dispense  Refill  . allopurinol (ZYLOPRIM) 100 MG tablet   Oral   Take 100 mg by mouth daily.          Marland Kitchen amiodarone (PACERONE) 400 MG tablet   Oral   Take 100 mg by mouth daily.         Marland Kitchen aspirin EC 325 MG tablet   Oral   Take 325 mg by mouth daily.         Marland Kitchen atorvastatin (LIPITOR) 40 MG tablet   Oral   Take 40 mg by mouth every evening.          . budesonide-formoterol (SYMBICORT) 160-4.5 MCG/ACT inhaler   Inhalation   Inhale 2 puffs into the lungs 2 (two) times daily.         Marland Kitchen diltiazem (CARDIZEM) 30 MG tablet   Oral   Take 30 mg by mouth 3 (three) times daily.         . ferrous fumarate (HEMOCYTE - 106 MG FE) 325 (106 FE) MG TABS   Oral   Take 1 tablet by mouth 2 (two) times daily.         . fish  oil-omega-3 fatty acids 1000 MG capsule   Oral   Take 1 g by mouth 2 (two) times daily.          . Flaxseed, Linseed, (FLAX SEED OIL PO)   Oral   Take 1 capsule by mouth 2 (two) times daily.         Marland Kitchen  furosemide (LASIX) 20 MG tablet   Oral   Take 20 mg by mouth daily.         Marland Kitchen HYDROcodone-acetaminophen (NORCO/VICODIN) 5-325 MG per tablet   Oral   Take 1-2 tablets by mouth every 6 (six) hours as needed.   60 tablet   0   . levalbuterol (XOPENEX HFA) 45 MCG/ACT inhaler   Inhalation   Inhale 1-2 puffs into the lungs every 4 (four) hours as needed. Wheezing         . omeprazole (PRILOSEC) 20 MG capsule   Oral   Take 20 mg by mouth daily.         . raloxifene (EVISTA) 60 MG tablet   Oral   Take 60 mg by mouth daily.         Marland Kitchen saccharomyces boulardii (FLORASTOR) 250 MG capsule   Oral   Take 250 mg by mouth 2 (two) times daily.           BP 129/36  Temp(Src) 98.7 F (37.1 C) (Oral)  Ht 5\' 1"  (1.549 m)  Wt 146 lb (66.225 kg)  BMI 27.6 kg/m2  SpO2 92%  Physical Exam  Constitutional: She is oriented to person, place, and time. She appears well-developed and well-nourished. No distress.  HENT:  Head: Normocephalic and atraumatic.  Eyes: Pupils are equal, round, and reactive to light.  Neck: Normal range of motion. Neck supple.  Cardiovascular: Normal rate, regular rhythm, S1 normal and S2 normal.   Murmur heard.  Systolic murmur is present with a grade of 2/6  Pulses:      Posterior tibial pulses are 2+ on the right side, and 2+ on the left side.  Pulmonary/Chest: Effort normal. She has wheezes (Diffuse expiratory wheezes).  Abdominal: Soft. There is no tenderness.  Musculoskeletal:       Left hip: She exhibits tenderness, bony tenderness and swelling.  Edema of left leg. Dressing C/D/I on left upper leg. TTP of entire hip. Lower extremity neurovascularly intact.  Lymphadenopathy:    She has no cervical adenopathy.  Neurological: She is alert and  oriented to person, place, and time. No cranial nerve deficit.  Skin: Skin is warm and dry. No rash noted. She is not diaphoretic.    ED Course  Procedures (including critical care time)  Labs Reviewed - No data to display Dg Pelvis 1-2 Views  04/17/2012  *RADIOLOGY REPORT*  Clinical Data: Groin pain  LEFT HIP - COMPLETE 2+ VIEW  Comparison: 04/13/2012  Findings: Dynamic compression screw x two and intramedullary rod are stable.  Additional screw in the femoral neck is in place. Intertrochanteric femur fracture is stable in appearance.  Stable alignment of the fracture fragments.  No breakage or loosening of the hardware. No new fracture.  IMPRESSION: ORIF left intertrochanteric femur fracture.  Stable alignment of the hardware and fracture fragments.  No new fracture.   Original Report Authenticated By: Jolaine Click, M.D.    Dg Femur Left  04/17/2012  *RADIOLOGY REPORT*  Clinical Data: Leg pain  LEFT FEMUR - 2 VIEW  Comparison: 04/13/2012  Findings: Two views of the mid and distal left femur submitted. Intramedullary rod is noted.  A metallic fixation screw noted distal femur.  There is anatomic alignment. Degenerative changes are noted left knee.  IMPRESSION: No acute fracture.  Intramedullary rod in anatomic alignment.   Original Report Authenticated By: Natasha Mead, M.D.      1. S/P ORIF (open reduction internal fixation) fracture   2. Left  leg pain      MDM  79 yo F with acute onset hip pain and "pop" s/p two recent hip and femur surgeries.  0845- Pt seen and examined. Appears comfortable at rest. Will get imaging of left hip and leg. Give Fentanyl for pain, as well as Zofran for nausea. Will be gentle with fluids given her cardiac history including CHF.  1000- X-rays reviewed. Anatomic alignment. Will consult ortho for any further recommendations. 56- Spoke with Dr. Turner Daniels from [facility] who is familiar with Ms. Alspaugh's case. He reviewed X-rays and feels like pt is safe for  discharge to SNF for rehab. He has recommended keeping any follow up appointments. Discussed with pt and daughter. She should keep her pain controlled. Will d/c safely.   Hilarie Fredrickson, MD 04/17/12 [phone]

## 2012-04-17 NOTE — ED Notes (Signed)
Report given to [facility] for transport to [facility] - family remains at bedside

## 2012-04-24 ENCOUNTER — Telehealth: Payer: Self-pay | Admitting: *Deleted

## 2012-04-24 NOTE — Telephone Encounter (Signed)
Received call from Tonya at [facility] with critical reading. Questionable VT vs. Artifact seen on monitor this AM. End of transmitted strip shows SR. Possible lead loss.  They have tried to reach pt but have been unable to contact her.  I called and spoke with pt's daughter who reports pt is at [facility].  (phone [phone]). Daughter spoke with pt earlier today and reported other than pt complaining of some nausea she was feeling fine. I told daughter I would call her back if any problems with pt after speaking with nurse at [facility].  I called and spoke with Lurena Joiner at [facility] who reports pt is OK and has been taking monitor off and on.  [facility] has requested call from pt at [phone].  Lurena Joiner will contact [facility].  [facility] to fax strips to our office.

## 2012-04-24 NOTE — Telephone Encounter (Signed)
Strips reviewed by Dr. Clifton James. Appears to be artifact.

## 2012-05-03 ENCOUNTER — Telehealth: Payer: Self-pay | Admitting: *Deleted

## 2012-05-03 NOTE — Telephone Encounter (Signed)
Spoke with pt's daughter and reviewed monitor results and recommendations from Dr. Clifton James

## 2012-05-03 NOTE — Telephone Encounter (Signed)
Dr. Clifton James reviewed monitor results. Shows SR with periods of bradycardia (40's -50's). No atrial fibrillation. Artifact noted. Per Dr. Clifton James pt to discontinue diltiazem and continue amiodarone.  I left message for daughter to call back to review these instructions.  I also spoke with Lurena Joiner at [facility] where pt is currently residing.  She requests I fax instructions to her at [phone]. Will fax page with physician comments and instructions to her.

## 2012-05-04 ENCOUNTER — Encounter: Payer: Self-pay | Admitting: Cardiovascular Disease

## 2012-05-04 ENCOUNTER — Ambulatory Visit (INDEPENDENT_AMBULATORY_CARE_PROVIDER_SITE_OTHER): Payer: Medicare Other | Admitting: Cardiovascular Disease

## 2012-05-04 ENCOUNTER — Ambulatory Visit: Payer: Medicare Other | Admitting: Cardiovascular Disease

## 2012-05-04 VITALS — BP 140/68 | HR 72 | Ht 64.0 in | Wt 155.0 lb

## 2012-05-04 DIAGNOSIS — I359 Nonrheumatic aortic valve disorder, unspecified: Secondary | ICD-10-CM

## 2012-05-04 DIAGNOSIS — R001 Bradycardia, unspecified: Secondary | ICD-10-CM

## 2012-05-04 DIAGNOSIS — I498 Other specified cardiac arrhythmias: Secondary | ICD-10-CM

## 2012-05-04 DIAGNOSIS — I350 Nonrheumatic aortic (valve) stenosis: Secondary | ICD-10-CM

## 2012-05-04 DIAGNOSIS — I4891 Unspecified atrial fibrillation: Secondary | ICD-10-CM

## 2012-05-04 NOTE — Progress Notes (Signed)
History of Present Illness: 79 yo female with CAD, HTN, HLD, COPD, carotid artery disease s/p bilateral CEA, atrial fibrillation and chronic diastolic CHF who is her today for cardiac follow up. She was recently admitted to the hospital after a fall that resulted in left wrist and hip fracture. She had a prolonged hospital stay in June 2013 for Salmonella enteritis with septic shock. She had been on Xarelto for anticoagulation and amiodarone. She is s/p TEE-DCCV in July 2013, however, she reverted back to atrial fibrillation. Patient was readmitted to the hospital 10/2011 with recurrent CDiff colitis and required PRBCs x 1 for worsening anemia. Plan was to eventually proceed with DCCV again once she had recovered from acute issues. However, she was admitted again with pneumonia and again with upper GI bleeding. EGD was negative for source of bleeding but did demonstrate extensive esophageal candidiasis. She was treated with nystatin and eventually transitioned over to fluconazole. Gastroenterology felt that anticoagulation could be resumed. She began to have diarrhea and C. difficile PCR was positive. She was placed on oral vancomycin. Xarelto had been reduced to 15 mg QD due to low GFR while hospitalized for pneumonia. Seen in 01/2012 and she was in NSR. She was admitted 03/13/12-03/16/12 after falling and injuring her wrist and left hip. Patient has a reported history of vertigo and is to see vestibular rehabilitation for treatment. Patient had left hip fracture and left distal radius fracture. She underwent ORIF of her radius and pinning of her left femoral neck fracture. We saw her in the hospital for bradycardia. Given her advanced age and documented instability with falling, anticoagulation was discontinued. She did have some junctional bradycardia while sleeping. Amiodarone was continued and her Cardizem was cut back to 120 mg daily. Patient had some worsening creatinine while hospitalized. This returned to  baseline prior to discharge. We arranged a 21 event monitor after discharge. Her event monitor showed sinus rhythm with sinus bradycardia, rates 40s to 50s at times. No atrial fibrillation. We stopped her diltiazem this week.   She is here today for follow up. She is now staying at [facility].  No syncope. No chest pain, dyspnea, orthopnea, PND. Some dizziness. Some LE edema. Overall doing well.   Primary Care Physician: Carilyn Goodpasture  Last Lipid Profile:Lipid Panel     Component Value Date/Time   CHOL 176 03/14/2012 0635   TRIG 94 03/14/2012 0635   HDL 76 03/14/2012 0635   CHOLHDL 2.3 03/14/2012 0635   VLDL 19 03/14/2012 0635   LDLCALC 81 03/14/2012 0635     Past Medical History  Diagnosis Date  . Hypertension   . Hyperlipidemia   . COPD (chronic obstructive pulmonary disease)   . Osteoporosis   . Arthritis   . H/O hiatal hernia   . Chronic back pain   . CAD (coronary artery disease)     LHC 9/05:  pLAD less than 20%, D1 20-30%, ostial RCA 40-50%, proximal RCA 20%, EF 60%.  . C. difficile diarrhea   . Gout   . Atrial fibrillation     a. failed DCCV => b. amiodarone added => converted to NSR on Amiodarone;  c. Xarelto d/c'd 2/2 falling (wrist and hip Fx in 1/14)  . Carotid stenosis     s/p bilat CEA  . C. difficile colitis     10/2011;  11/2011  . Salmonella enteritis 08/2011    c/b septic shock  . Hx of echocardiogram     a. Echocardiogram 09/29/11: Mild  LVH, EF 45-50%, diffuse HK, mild to moderate aortic stenosis, mean gradient 12 mmHg, moderate MAC, mild MR, moderate LAE, moderate RAE, question small secundum ASD with left to right flow not seen in 4 chamber view, PASP 35-39 (mild pulmonary hypertension). ;  b.  TEE on 10/04/11: Mild LVH, EF 55%, mild MR, no defect or PFO  . Pneumonia 11/2101  . PONV (postoperative nausea and vomiting)   . CHF (congestive heart failure)   . Chronic kidney disease   . GERD (gastroesophageal reflux disease)   . Hepatitis     Past  Surgical History  Procedure Laterality Date  . Carotid endarterectomy      bilateral  . Cataract extraction, bilateral    . Joint replacement  2008    Right Total Knee  . Anal fistula repair    . Left parotidectomy    . Eye surgery    . Flexible sigmoidoscopy  09/02/2011    Procedure: FLEXIBLE SIGMOIDOSCOPY;  Surgeon: Theda Belfast, MD;  Location: [facility] ENDOSCOPY;  Service: Endoscopy;  Laterality: N/A;  . Flexible sigmoidoscopy  09/03/2011    Procedure: FLEXIBLE SIGMOIDOSCOPY;  Surgeon: Theda Belfast, MD;  Location: [facility] ENDOSCOPY;  Service: Endoscopy;  Laterality: N/A;  . Tee without cardioversion  10/04/2011    Procedure: TRANSESOPHAGEAL ECHOCARDIOGRAM (TEE);  Surgeon: Laurey Morale, MD;  Location: [facility] ENDOSCOPY;  Service: Cardiovascular;  Laterality: N/A;  to be carelinked here by 1230-verified 7/29/dl  . Cardioversion  10/04/2011    Procedure: CARDIOVERSION;  Surgeon: Laurey Morale, MD;  Location: [facility] ENDOSCOPY;  Service: Cardiovascular;  Laterality: N/A;  . Esophagogastroduodenoscopy  11/29/2011    Procedure: ESOPHAGOGASTRODUODENOSCOPY (EGD);  Surgeon: Florencia Reasons, MD;  Location: [facility] ENDOSCOPY;  Service: Endoscopy;  Laterality: N/A;  recent h/o resp failure/pneumonia  . Left hip fracture repair  03/14/12  . Open reduction internal fixation (orif) scaphoid with distal radius graft  03/14/2012    Procedure: OPEN REDUCTION INTERNAL FIXATION (ORIF) SCAPHOID WITH DISTAL RADIUS GRAFT;  Surgeon: Jodi Marble, MD;  Location: [facility] ORS;  Service: Orthopedics;  Laterality: Left;  DVR wrist fracture set/ hand innovation  . Hip pinning,cannulated  03/14/2012    Procedure: CANNULATED HIP PINNING;  Surgeon: Jodi Marble, MD;  Location: [facility] ORS;  Service: Orthopedics;  Laterality: Left;  Biomet 6.5 cannulated screws  . Femur im nail Left 04/13/2012    Procedure: INTRAMEDULLARY (IM) NAIL FEMORAL;  Surgeon: Jodi Marble, MD;  Location: [facility] OR;  Service: Orthopedics;  Laterality: Left;  OPEN REDUCTION  INTERNAL FIXATION LEFT PROXIMAL FEMUR Fracture     Current Outpatient Prescriptions  Medication Sig Dispense Refill  . allopurinol (ZYLOPRIM) 100 MG tablet Take 100 mg by mouth daily.       Marland Kitchen amiodarone (PACERONE) 400 MG tablet Take 100 mg by mouth daily.      Marland Kitchen aspirin EC 325 MG tablet Take 325 mg by mouth daily.      Marland Kitchen atorvastatin (LIPITOR) 40 MG tablet Take 40 mg by mouth every evening.       . budesonide-formoterol (SYMBICORT) 160-4.5 MCG/ACT inhaler Inhale 2 puffs into the lungs 2 (two) times daily.      . ferrous fumarate (HEMOCYTE - 106 MG FE) 325 (106 FE) MG TABS Take 1 tablet by mouth 2 (two) times daily.      . fish oil-omega-3 fatty acids 1000 MG capsule Take 1 g by mouth 2 (two) times daily.       . Flaxseed, Linseed, (FLAX SEED  OIL PO) Take 1 capsule by mouth 2 (two) times daily.      . furosemide (LASIX) 20 MG tablet Take 20 mg by mouth daily.      Marland Kitchen HYDROcodone-acetaminophen (NORCO/VICODIN) 5-325 MG per tablet Take 1-2 tablets by mouth every 6 (six) hours as needed.  60 tablet  0  . levalbuterol (XOPENEX HFA) 45 MCG/ACT inhaler Inhale 1-2 puffs into the lungs every 4 (four) hours as needed. Wheezing      . omeprazole (PRILOSEC) 20 MG capsule Take 20 mg by mouth daily.      . raloxifene (EVISTA) 60 MG tablet Take 60 mg by mouth daily.      Marland Kitchen saccharomyces boulardii (FLORASTOR) 250 MG capsule Take 250 mg by mouth 2 (two) times daily.       No current facility-administered medications for this visit.    Allergies  Allergen Reactions  . Baby Powder (Methylbenzethonium) Shortness Of Breath  . Demerol (Meperidine) Hives  . Penicillins Hives    History   Social History  . Marital Status: Widowed    Spouse Name: N/A    Number of Children: N/A  . Years of Education: N/A   Occupational History  . Not on file.   Social History Main Topics  . Smoking status: Former Smoker -- 1.00 packs/day for 20 years    Types: Cigarettes    Quit date: 09/01/1983  . Smokeless  tobacco: Never Used  . Alcohol Use: No  . Drug Use: No  . Sexually Active: No   Other Topics Concern  . Not on file   Social History Narrative  . No narrative on file    Family History  Problem Relation Age of Onset  . Hemochromatosis Sister   . Diabetes type II Mother     Review of Systems:  As stated in the HPI and otherwise negative.   BP 140/68  Pulse 72  Ht 5\' 4"  (1.626 m)  Wt 155 lb (70.308 kg)  BMI 26.59 kg/m2  Physical Examination: General: Well developed, well nourished, NAD HEENT: OP clear, mucus membranes moist SKIN: warm, dry. No rashes. Neuro: No focal deficits Musculoskeletal: Muscle strength 5/5 all ext Psychiatric: Mood and affect normal Neck: No JVD, no carotid bruits, no thyromegaly, no lymphadenopathy. Lungs:Clear bilaterally, no wheezes, rhonci, crackles Cardiovascular: Regular rate and rhythm. Systolic murmur. No gallops or rubs. Abdomen:Soft. Bowel sounds present. Non-tender.  Extremities: No lower extremity edema. Pulses are 2 + in the bilateral DP/PT.  Event monitor: sinus brady, no atrial fib  Assessment and Plan:   1. Bradycardia: She continues to have bradycardia. Event monitor with rates 40-50 bpm. No atrial fibrillation noted. She has stopped her Diltiazem. Will continue low dose amiodarone.   2. Atrial Fibrillation: She is maintaining sinus rhythm. She remains on amiodarone. She is now on aspirin only. Anticoagulation was stopped due to fall risk. Question if bradycardia resulted in her falls. Will keep her off of Diltiazem.   3. Chronic Combined Systolic and Diastolic CHF: Mild lower ext edema. Will increase Lasix to 40 mg po Qdaily for one week then 20 mg per day.   4. Chronic Kidney Disease: Managed by nephrology.  5. Coronary Artery Disease: Stable. No angina. Continue aspirin and statin.  6. Aortic stenosis: Moderate by echo July 2013. Repeat in July 2014.

## 2012-05-04 NOTE — Patient Instructions (Addendum)
Your physician recommends that you schedule a follow-up appointment in: late June 2014 after echo  Your physician has requested that you have an echocardiogram. Echocardiography is a painless test that uses sound waves to create images of your heart. It provides your doctor with information about the size and shape of your heart and how well your heart's chambers and valves are working. This procedure takes approximately one hour. There are no restrictions for this procedure. To be done in mid June 2014  Your physician has recommended you make the following change in your medication: Increase furosemide to 40 mg by mouth daily for 7 days and then return to 20 mg by mouth daily

## 2012-07-17 ENCOUNTER — Telehealth: Payer: Self-pay | Admitting: *Deleted

## 2012-07-17 DIAGNOSIS — I10 Essential (primary) hypertension: Secondary | ICD-10-CM

## 2012-07-17 NOTE — Telephone Encounter (Signed)
Per nurse with [facility] -  pt's niece Harriett Sine has called there reporting pt's BP has been elevated 5/6 165/76 5/7 183/77 5/10 175/86 5/11 162/83 5/12 172/96 5/13 172/89  Pt had been on Diltiazem for HTN control however it was d/ced due to bradycardia.  Nurse did not know how the pt's HR has been.  Niece - Harriett Sine does not know what pt's HR has been either.  Pt is not available as she is "gone to an appointment."  Aware I will forward information to MD for review and call niece [phone]) with any new orders.

## 2012-07-18 NOTE — Telephone Encounter (Signed)
Can we start amlodipine 5 mg po Qdaily? Thanks, chris

## 2012-07-19 MED ORDER — AMLODIPINE BESYLATE 5 MG PO TABS: 5.0000 mg | ORAL_TABLET | Freq: Every day | ORAL | Status: DC

## 2012-07-19 NOTE — Telephone Encounter (Signed)
Follow up    Pt's niece Cleora Fleet is returning your call.

## 2012-07-19 NOTE — Telephone Encounter (Signed)
Spoke with pt's niece and gave her instructions from Dr. Clifton James. Will send prescription to [facility] on [address].

## 2012-07-19 NOTE — Telephone Encounter (Signed)
Left message to call back. Pt's daughter is presently out of the country and niece is overseeing care of pt

## 2012-08-06 ENCOUNTER — Other Ambulatory Visit: Payer: Self-pay | Admitting: *Deleted

## 2012-08-06 MED ORDER — AMIODARONE HCL 100 MG PO TABS: 100.0000 mg | ORAL_TABLET | Freq: Every day | ORAL | Status: DC

## 2012-08-06 NOTE — Telephone Encounter (Signed)
Due to notes on file pt is taking 100mg  of her Amiodarone. Pt daughter calling for refills. Fax Received. Refill Completed. [name] [name] (R.M.A)

## 2012-08-08 ENCOUNTER — Telehealth: Payer: Self-pay | Admitting: Cardiovascular Disease

## 2012-08-08 NOTE — Telephone Encounter (Signed)
100 mg amiodarone daily will be ok. cdm

## 2012-08-08 NOTE — Telephone Encounter (Signed)
New Prob    Pt has some questions regarding AMIODARONE prescription. States directions are different and needs some clarification. Please call.

## 2012-08-08 NOTE — Telephone Encounter (Signed)
Spoke with pt's daughter and gave her instructions from Dr. Clifton James regarding Amiodarone dose

## 2012-08-08 NOTE — Telephone Encounter (Signed)
Follow up  Pt is returning your call, she said you can leave a message if she does not answer.

## 2012-08-08 NOTE — Telephone Encounter (Signed)
Chart reviewed. When pt saw Tereso Newcomer, PA on 03/29/12 she was taking Amiodarone 100 mg twice daily.  Pt was admitted to hospital on 04/12/12 and med list was changed on 04/12/12 indicating pt was taking 100 mg Amiodarone daily.  Pt was discharged on Amiodarone 100 mg daily.  When pt saw Dr. Clifton James on 05/04/12 current prescription list indicates pt told us she was taking 100 mg daily.  As noted earlier daughter states pt has been receiving Amiodarone 100 mg twice daily.  Will sent note to Dr. Clifton James to determine what dose pt should be on.

## 2012-08-08 NOTE — Telephone Encounter (Signed)
Spoke with pt's daughter who is asking for clarification on Amiodarone dose.  Daughter states she has been giving pt Amiodarone 100 mg twice daily.  Our records indicate pt is on 100 mg daily.  I told daughter I would research and call her back.

## 2012-08-17 ENCOUNTER — Other Ambulatory Visit: Payer: Self-pay

## 2012-08-17 ENCOUNTER — Ambulatory Visit (HOSPITAL_COMMUNITY): Payer: Medicare Other | Attending: Cardiology | Admitting: Radiology

## 2012-08-17 DIAGNOSIS — J449 Chronic obstructive pulmonary disease, unspecified: Secondary | ICD-10-CM | POA: Insufficient documentation

## 2012-08-17 DIAGNOSIS — J4489 Other specified chronic obstructive pulmonary disease: Secondary | ICD-10-CM | POA: Insufficient documentation

## 2012-08-17 DIAGNOSIS — I359 Nonrheumatic aortic valve disorder, unspecified: Secondary | ICD-10-CM | POA: Insufficient documentation

## 2012-08-17 DIAGNOSIS — E785 Hyperlipidemia, unspecified: Secondary | ICD-10-CM | POA: Insufficient documentation

## 2012-08-17 DIAGNOSIS — I4891 Unspecified atrial fibrillation: Secondary | ICD-10-CM

## 2012-08-17 DIAGNOSIS — I10 Essential (primary) hypertension: Secondary | ICD-10-CM | POA: Insufficient documentation

## 2012-08-17 DIAGNOSIS — I509 Heart failure, unspecified: Secondary | ICD-10-CM | POA: Insufficient documentation

## 2012-08-17 DIAGNOSIS — Z87891 Personal history of nicotine dependence: Secondary | ICD-10-CM | POA: Insufficient documentation

## 2012-08-17 DIAGNOSIS — I350 Nonrheumatic aortic (valve) stenosis: Secondary | ICD-10-CM

## 2012-08-17 NOTE — Progress Notes (Signed)
Echocardiogram performed.  

## 2012-08-30 ENCOUNTER — Ambulatory Visit (INDEPENDENT_AMBULATORY_CARE_PROVIDER_SITE_OTHER): Payer: Medicare Other | Admitting: Cardiovascular Disease

## 2012-08-30 ENCOUNTER — Ambulatory Visit: Payer: Medicare Other | Admitting: Cardiovascular Disease

## 2012-08-30 ENCOUNTER — Encounter: Payer: Self-pay | Admitting: Cardiovascular Disease

## 2012-08-30 VITALS — BP 93/53 | HR 73 | Ht 61.0 in | Wt 152.0 lb

## 2012-08-30 DIAGNOSIS — I4891 Unspecified atrial fibrillation: Secondary | ICD-10-CM

## 2012-08-30 DIAGNOSIS — R001 Bradycardia, unspecified: Secondary | ICD-10-CM

## 2012-08-30 DIAGNOSIS — I10 Essential (primary) hypertension: Secondary | ICD-10-CM

## 2012-08-30 DIAGNOSIS — I2510 Atherosclerotic heart disease of native coronary artery without angina pectoris: Secondary | ICD-10-CM

## 2012-08-30 DIAGNOSIS — I498 Other specified cardiac arrhythmias: Secondary | ICD-10-CM

## 2012-08-30 NOTE — Patient Instructions (Addendum)
Your physician wants you to follow-up in:  6 months. You will receive a reminder letter in the mail two months in advance. If you don't receive a letter, please call our office to schedule the follow-up appointment.   

## 2012-08-30 NOTE — Progress Notes (Signed)
History of Present Illness: 79 yo female with CAD, HTN, HLD, COPD, carotid artery disease s/p bilateral CEA, atrial fibrillation and chronic diastolic CHF who is her today for cardiac follow up. She had a prolonged hospital stay in June 2013 for Salmonella enteritis with septic shock. She had been on Xarelto for anticoagulation and amiodarone. She is s/p TEE-DCCV in July 2013, however, she reverted back to atrial fibrillation. Patient was readmitted to the hospital 10/2011 with recurrent CDiff colitis and required PRBCs x 1 for worsening anemia. Plan was to eventually proceed with DCCV again once she had recovered from acute issues. However, she was admitted again with pneumonia and again with upper GI bleeding. EGD was negative for source of bleeding but did demonstrate extensive esophageal candidiasis. She was treated with nystatin and eventually transitioned over to fluconazole. Gastroenterology felt that anticoagulation could be resumed. She began to have diarrhea and C. difficile PCR was positive. She was placed on oral vancomycin. Xarelto had been reduced to 15 mg QD due to low GFR while hospitalized for pneumonia. Seen in 01/2012 and she was in NSR. She was admitted 03/13/12-03/16/12 after falling and injuring her wrist and left hip. Patient had left hip fracture and left distal radius fracture. She underwent ORIF of her radius and pinning of her left femoral neck fracture. We saw her in the hospital for bradycardia. Given her advanced age and documented instability with falling, anticoagulation was discontinued. She did have some junctional bradycardia while sleeping. Amiodarone was continued and her Cardizem was cut back to 120 mg daily. Patient had some worsening creatinine while hospitalized. This returned to baseline prior to discharge. We arranged a 21 event monitor after discharge. Her event monitor showed sinus rhythm with sinus bradycardia, rates 40s to 50s at times. No atrial fibrillation. We  stopped her diltiazem.   She is here today for follow up. No chest pain or SOB.  No syncope, orthopnea, PND, dizziness. Overall doing well.   Primary Care Physician: Carilyn Goodpasture  Last Lipid Profile:Lipid Panel     Component Value Date/Time   CHOL 176 03/14/2012 0635   TRIG 94 03/14/2012 0635   HDL 76 03/14/2012 0635   CHOLHDL 2.3 03/14/2012 0635   VLDL 19 03/14/2012 0635   LDLCALC 81 03/14/2012 0635     Past Medical History  Diagnosis Date  . Hypertension   . Hyperlipidemia   . COPD (chronic obstructive pulmonary disease)   . Osteoporosis   . Arthritis   . H/O hiatal hernia   . Chronic back pain   . CAD (coronary artery disease)     LHC 9/05:  pLAD less than 20%, D1 20-30%, ostial RCA 40-50%, proximal RCA 20%, EF 60%.  . C. difficile diarrhea   . Gout   . Atrial fibrillation     a. failed DCCV => b. amiodarone added => converted to NSR on Amiodarone;  c. Xarelto d/c'd 2/2 falling (wrist and hip Fx in 1/14)  . Carotid stenosis     s/p bilat CEA  . C. difficile colitis     10/2011;  11/2011  . Salmonella enteritis 08/2011    c/b septic shock  . Hx of echocardiogram     a. Echocardiogram 09/29/11: Mild LVH, EF 45-50%, diffuse HK, mild to moderate aortic stenosis, mean gradient 12 mmHg, moderate MAC, mild MR, moderate LAE, moderate RAE, question small secundum ASD with left to right flow not seen in 4 chamber view, PASP 35-39 (mild pulmonary hypertension). ;  b.  TEE on 10/04/11: Mild LVH, EF 55%, mild MR, no defect or PFO  . Pneumonia 11/2101  . PONV (postoperative nausea and vomiting)   . CHF (congestive heart failure)   . Chronic kidney disease   . GERD (gastroesophageal reflux disease)   . Hepatitis     Past Surgical History  Procedure Laterality Date  . Carotid endarterectomy      bilateral  . Cataract extraction, bilateral    . Joint replacement  2008    Right Total Knee  . Anal fistula repair    . Left parotidectomy    . Eye surgery    . Flexible sigmoidoscopy   09/02/2011    Procedure: FLEXIBLE SIGMOIDOSCOPY;  Surgeon: Theda Belfast, MD;  Location: [facility] ENDOSCOPY;  Service: Endoscopy;  Laterality: N/A;  . Flexible sigmoidoscopy  09/03/2011    Procedure: FLEXIBLE SIGMOIDOSCOPY;  Surgeon: Theda Belfast, MD;  Location: [facility] ENDOSCOPY;  Service: Endoscopy;  Laterality: N/A;  . Tee without cardioversion  10/04/2011    Procedure: TRANSESOPHAGEAL ECHOCARDIOGRAM (TEE);  Surgeon: Laurey Morale, MD;  Location: [facility] ENDOSCOPY;  Service: Cardiovascular;  Laterality: N/A;  to be carelinked here by 1230-verified 7/29/dl  . Cardioversion  10/04/2011    Procedure: CARDIOVERSION;  Surgeon: Laurey Morale, MD;  Location: [facility] ENDOSCOPY;  Service: Cardiovascular;  Laterality: N/A;  . Esophagogastroduodenoscopy  11/29/2011    Procedure: ESOPHAGOGASTRODUODENOSCOPY (EGD);  Surgeon: Florencia Reasons, MD;  Location: [facility]COPY;  Service: Endoscopy;  Laterality: N/A;  recent h/o resp failure/pneumonia  . Left hip fracture repair  03/14/12  . Open reduction internal fixation (orif) scaphoid with distal radius graft  03/14/2012    Procedure: OPEN REDUCTION INTERNAL FIXATION (ORIF) SCAPHOID WITH DISTAL RADIUS GRAFT;  Surgeon: Jodi Marble, MD;  Location: [facility]S;  Service: Orthopedics;  Laterality: Left;  DVR wrist fracture set/ hand innovation  . Hip pinning,cannulated  03/14/2012    Procedure: CANNULATED HIP PINNING;  Surgeon: Jodi Marble, MD;  Location: [facility] ORS;  Service: Orthopedics;  Laterality: Left;  Biomet 6.5 cannulated screws  . Femur im nail Left 04/13/2012    Procedure: INTRAMEDULLARY (IM) NAIL FEMORAL;  Surgeon: Jodi Marble, MD;  Location: [facility] OR;  Service: Orthopedics;  Laterality: Left;  OPEN REDUCTION INTERNAL FIXATION LEFT PROXIMAL FEMUR Fracture     Current Outpatient Prescriptions  Medication Sig Dispense Refill  . allopurinol (ZYLOPRIM) 100 MG tablet Take 100 mg by mouth daily.       Marland Kitchen amiodarone (PACERONE) 100 MG tablet Take 1 tablet (100 mg total) by mouth  daily.  30 tablet  3  . amLODipine (NORVASC) 5 MG tablet Take 1 tablet (5 mg total) by mouth daily.  30 tablet  11  . aspirin EC 325 MG tablet Take 325 mg by mouth daily.      Marland Kitchen atorvastatin (LIPITOR) 40 MG tablet Take 40 mg by mouth every evening.       . budesonide-formoterol (SYMBICORT) 160-4.5 MCG/ACT inhaler Inhale 2 puffs into the lungs 2 (two) times daily.      . ferrous fumarate (HEMOCYTE - 106 MG FE) 325 (106 FE) MG TABS Take 1 tablet by mouth 2 (two) times daily.      . ferrous sulfate 325 (65 FE) MG tablet Take 325 mg by mouth 2 (two) times daily with a meal. Am and pm      . fish oil-omega-3 fatty acids 1000 MG capsule Take 1 g by mouth 2 (two) times daily.       Marland Kitchen  Flaxseed, Linseed, (FLAX SEED OIL PO) Take 1 capsule by mouth 2 (two) times daily.      . furosemide (LASIX) 20 MG tablet Take 20 mg by mouth daily.      Marland Kitchen HYDROcodone-acetaminophen (NORCO/VICODIN) 5-325 MG per tablet Take 1-2 tablets by mouth every 6 (six) hours as needed.  60 tablet  0  . levalbuterol (XOPENEX HFA) 45 MCG/ACT inhaler Inhale 1-2 puffs into the lungs every 4 (four) hours as needed. Wheezing      . loratadine (CLARITIN) 10 MG tablet Take 10 mg by mouth daily.      Marland Kitchen omeprazole (PRILOSEC) 20 MG capsule Take 20 mg by mouth daily.      . polyethylene glycol powder (MIRALAX) powder Take by mouth daily. 8 ounces once daily      . raloxifene (EVISTA) 60 MG tablet Take 60 mg by mouth daily.      Marland Kitchen saccharomyces boulardii (FLORASTOR) 250 MG capsule Take 250 mg by mouth 2 (two) times daily.       No current facility-administered medications for this visit.    Allergies  Allergen Reactions  . Baby Powder (Methylbenzethonium) Shortness Of Breath  . Demerol (Meperidine) Hives  . Penicillins Hives    History   Social History  . Marital Status: Widowed    Spouse Name: N/A    Number of Children: N/A  . Years of Education: N/A   Occupational History  . Not on file.   Social History Main Topics  . Smoking  status: Former Smoker -- 1.00 packs/day for 20 years    Types: Cigarettes    Quit date: 09/01/1983  . Smokeless tobacco: Never Used  . Alcohol Use: No  . Drug Use: No  . Sexually Active: No   Other Topics Concern  . Not on file   Social History Narrative  . No narrative on file    Family History  Problem Relation Age of Onset  . Hemochromatosis Sister   . Diabetes type II Mother     Review of Systems:  As stated in the HPI and otherwise negative.   BP 93/53  Pulse 73  Ht 5\' 1"  (1.549 m)  Wt 152 lb (68.947 kg)  BMI 28.74 kg/m2  Physical Examination: General: Well developed, well nourished, NAD HEENT: OP clear, mucus membranes moist SKIN: warm, dry. No rashes. Neuro: No focal deficits Musculoskeletal: Muscle strength 5/5 all ext Psychiatric: Mood and affect normal Neck: No JVD, no carotid bruits, no thyromegaly, no lymphadenopathy. Lungs:Clear bilaterally, no wheezes, rhonci, crackles Cardiovascular: Regular rate and rhythm. Soft systolic murmur Abdomen:Soft. Bowel sounds present. Non-tender.  Extremities: No lower extremity edema. Pulses are 2 + in the bilateral DP/PT.  EKG: Sinus, rate 77 bpm. 1st degree AV block. LVH. Non-specific ST and T wave abnormality.   Echo 08/17/12: Left ventricle: The cavity size was normal. Wall thickness was normal. Systolic function was mildly reduced. The estimated ejection fraction was in the range of 45% to 50%. Diffuse hypokinesis. Doppler parameters are consistent with abnormal left ventricular relaxation (grade 1 diastolic dysfunction). - Aortic valve: There was mild stenosis. - Left atrium: The atrium was mildly dilated.  Assessment and Plan:   1. Bradycardia: Resolved off of Diltiazem. No atrial fibrillation noted. Will continue low dose amiodarone.   2. Atrial Fibrillation: She is maintaining sinus rhythm. She remains on amiodarone. She is now on aspirin only. Anticoagulation was stopped due to fall risk.  3. Chronic  Combined Systolic and Diastolic CHF: No lower ext  edema. Continue Lasix 20 mg po Qdaily. Use extra 20 mg for swellling.   4. Chronic Kidney Disease: Managed by nephrology.   5. Coronary Artery Disease: Stable. No angina. Continue aspirin and statin.   6. Aortic stenosis: Mild by echo June 2014.

## 2012-09-25 ENCOUNTER — Other Ambulatory Visit: Payer: Self-pay | Admitting: Cardiovascular Disease

## 2012-10-30 ENCOUNTER — Other Ambulatory Visit: Payer: Self-pay | Admitting: *Deleted

## 2012-10-30 DIAGNOSIS — Z48812 Encounter for surgical aftercare following surgery on the circulatory system: Secondary | ICD-10-CM

## 2012-11-09 ENCOUNTER — Encounter: Payer: Self-pay | Admitting: Surgery

## 2012-11-12 ENCOUNTER — Encounter: Payer: Self-pay | Admitting: Surgery

## 2012-11-12 ENCOUNTER — Ambulatory Visit (INDEPENDENT_AMBULATORY_CARE_PROVIDER_SITE_OTHER): Payer: Medicare Other | Admitting: Surgery

## 2012-11-12 ENCOUNTER — Other Ambulatory Visit (INDEPENDENT_AMBULATORY_CARE_PROVIDER_SITE_OTHER): Payer: Medicare Other | Admitting: *Deleted

## 2012-11-12 ENCOUNTER — Other Ambulatory Visit: Payer: Self-pay | Admitting: *Deleted

## 2012-11-12 DIAGNOSIS — M79609 Pain in unspecified limb: Secondary | ICD-10-CM

## 2012-11-12 DIAGNOSIS — Z48812 Encounter for surgical aftercare following surgery on the circulatory system: Secondary | ICD-10-CM

## 2012-11-12 DIAGNOSIS — I6529 Occlusion and stenosis of unspecified carotid artery: Secondary | ICD-10-CM | POA: Insufficient documentation

## 2012-11-12 NOTE — Progress Notes (Signed)
[facility] [place] HISTORY AND PHYSICAL   CC:  Follow up carotid duplex scan  Referring Provider:  Carilyn Goodpasture, PA-C  HPI: This is a 79 y.o. female who has known carotid stenosis is here for f/u carotid duplex scan.  Denies amaurosis fugax, paresthesias, or hemiparesis.  She states that she was having left neck pain and lump in her left neck and wanted to have her neck checked.  She is s/p bilateral CEA in 1998 and a right redo CEA in 2010 by Dr. Myra Gianotti.  She has done well since then.  This past summer, she was hospitalized with salmonella enteritis with septic shock as well as needing an ORIF for fractured wrist and C. Diff colitis.  She also had atrial fibrillation and was on xarelto.  She did have an upper GIB and EGD was negative for source of bleeding but did have extensive esophageal candidiasis and was treated.  She has since converted to NSR.  She states that she is doing quite well now and also her appetite has returned.  She is on a statin for her CAD.  Past Medical History  Diagnosis Date  . Hypertension   . Hyperlipidemia   . COPD (chronic obstructive pulmonary disease)   . Osteoporosis   . Arthritis   . H/O hiatal hernia   . Chronic back pain   . CAD (coronary artery disease)     LHC 9/05:  pLAD less than 20%, D1 20-30%, ostial RCA 40-50%, proximal RCA 20%, EF 60%.  . C. difficile diarrhea   . Gout   . Atrial fibrillation     a. failed DCCV => b. amiodarone added => converted to NSR on Amiodarone;  c. Xarelto d/c'd 2/2 falling (wrist and hip Fx in 1/14)  . Carotid stenosis     s/p bilat CEA  . C. difficile colitis     10/2011;  11/2011  . Salmonella enteritis 08/2011    c/b septic shock  . Hx of echocardiogram     a. Echocardiogram 09/29/11: Mild LVH, EF 45-50%, diffuse HK, mild to moderate aortic stenosis, mean gradient 12 mmHg, moderate MAC, mild MR, moderate LAE, moderate RAE, question small secundum ASD with left to right flow not seen in 4  chamber view, PASP 35-39 (mild pulmonary hypertension). ;  b.  TEE on 10/04/11: Mild LVH, EF 55%, mild MR, no defect or PFO  . Pneumonia 11/2101  . PONV (postoperative nausea and vomiting)   . CHF (congestive heart failure)   . Chronic kidney disease   . GERD (gastroesophageal reflux disease)   . Hepatitis   . Anemia    Past Surgical History  Procedure Laterality Date  . Carotid endarterectomy      bilateral  . Cataract extraction, bilateral    . Joint replacement  2008    Right Total Knee  . Anal fistula repair    . Left parotidectomy    . Eye surgery    . Flexible sigmoidoscopy  09/02/2011    Procedure: FLEXIBLE SIGMOIDOSCOPY;  Surgeon: Theda Belfast, MD;  Location: [facility];  Service: Endoscopy;  Laterality: N/A;  . Flexible sigmoidoscopy  09/03/2011    Procedure: FLEXIBLE SIGMOIDOSCOPY;  Surgeon: Theda Belfast, MD;  Location: [facility];  Service: Endoscopy;  Laterality: N/A;  . Tee without cardioversion  10/04/2011    Procedure: TRANSESOPHAGEAL ECHOCARDIOGRAM (TEE);  Surgeon: Laurey Morale, MD;  Location: [facility] ENDOSCOPY;  Service: Cardiovascular;  Laterality: N/A;  to be carelinked here  by 1230-verified 7/29/dl  . Cardioversion  10/04/2011    Procedure: CARDIOVERSION;  Surgeon: Laurey Morale, MD;  Location: [facility] ENDOSCOPY;  Service: Cardiovascular;  Laterality: N/A;  . Esophagogastroduodenoscopy  11/29/2011    Procedure: ESOPHAGOGASTRODUODENOSCOPY (EGD);  Surgeon: Florencia Reasons, MD;  Location: [facility]COPY;  Service: Endoscopy;  Laterality: N/A;  recent h/o resp failure/pneumonia  . Left hip fracture repair  03/14/12  . Open reduction internal fixation (orif) scaphoid with distal radius graft  03/14/2012    Procedure: OPEN REDUCTION INTERNAL FIXATION (ORIF) SCAPHOID WITH DISTAL RADIUS GRAFT;  Surgeon: Jodi Marble, MD;  Location: [facility];  Service: Orthopedics;  Laterality: Left;  DVR wrist fracture set/ hand innovation  . Hip pinning,cannulated  03/14/2012    Procedure:  CANNULATED HIP PINNING;  Surgeon: Jodi Marble, MD;  Location: [facility];  Service: Orthopedics;  Laterality: Left;  Biomet 6.5 cannulated screws  . Femur im nail Left 04/13/2012    Procedure: INTRAMEDULLARY (IM) NAIL FEMORAL;  Surgeon: Jodi Marble, MD;  Location: [facility] OR;  Service: Orthopedics;  Laterality: Left;  OPEN REDUCTION INTERNAL FIXATION LEFT PROXIMAL FEMUR Fracture     Allergies  Allergen Reactions  . Baby Powder [Methylbenzethonium] Shortness Of Breath  . Demerol [Meperidine] Hives  . Penicillins Hives    Current Outpatient Prescriptions  Medication Sig Dispense Refill  . allopurinol (ZYLOPRIM) 100 MG tablet Take 100 mg by mouth daily.       Marland Kitchen amiodarone (PACERONE) 100 MG tablet Take 1 tablet (100 mg total) by mouth daily.  30 tablet  3  . amLODipine (NORVASC) 5 MG tablet Take 1 tablet (5 mg total) by mouth daily.  30 tablet  11  . aspirin EC 325 MG tablet Take 325 mg by mouth daily.      Marland Kitchen atorvastatin (LIPITOR) 40 MG tablet TAKE 1 TABLET BY MOUTH EVERY DAY  30 tablet  6  . budesonide-formoterol (SYMBICORT) 160-4.5 MCG/ACT inhaler Inhale 2 puffs into the lungs 2 (two) times daily.      . ferrous sulfate 325 (65 FE) MG tablet Take 325 mg by mouth 2 (two) times daily with a meal. Am and pm      . fish oil-omega-3 fatty acids 1000 MG capsule Take 1 g by mouth 2 (two) times daily.       . Flaxseed, Linseed, (FLAX SEED OIL PO) Take 1 capsule by mouth 2 (two) times daily.      . furosemide (LASIX) 20 MG tablet Take 20 mg by mouth daily.      . Omega-3 Fat Ac-Cholecalciferol (MINICAPS VITAMIN-D/OMEGA-3 PO) Take 4,000 mg by mouth daily.      Marland Kitchen omeprazole (PRILOSEC) 20 MG capsule Take 20 mg by mouth daily.      . raloxifene (EVISTA) 60 MG tablet Take 60 mg by mouth daily.      . ferrous fumarate (HEMOCYTE - 106 MG FE) 325 (106 FE) MG TABS Take 1 tablet by mouth 2 (two) times daily.      Marland Kitchen HYDROcodone-acetaminophen (NORCO/VICODIN) 5-325 MG per tablet Take 1-2 tablets by mouth every  6 (six) hours as needed.  60 tablet  0  . levalbuterol (XOPENEX HFA) 45 MCG/ACT inhaler Inhale 1-2 puffs into the lungs every 4 (four) hours as needed. Wheezing      . loratadine (CLARITIN) 10 MG tablet Take 10 mg by mouth daily.      . polyethylene glycol powder (MIRALAX) powder Take by mouth daily. 8 ounces once daily      .  saccharomyces boulardii (FLORASTOR) 250 MG capsule Take 250 mg by mouth 2 (two) times daily.       No current facility-administered medications for this visit.    Pt's meds include: Statin:  yes Beta Blocker:  no Aspirin:  yes Other antiplatelets/anticoagulants:  no   Family History  Problem Relation Age of Onset  . Hemochromatosis Sister   . Diabetes Sister   . Hypertension Sister   . Diabetes type II Mother   . Diabetes Mother   . Hypertension Mother   . Deep vein thrombosis Mother   . Hypertension Father   . Hypertension Brother   . Other Brother     Leg amputation-Gun shot  . Depression Brother   . Hemochromatosis Sister     History   Social History  . Marital Status: Widowed    Spouse Name: N/A    Number of Children: N/A  . Years of Education: N/A   Occupational History  . Not on file.   Social History Main Topics  . Smoking status: Former Smoker -- 1.00 packs/day for 20 years    Types: Cigarettes    Quit date: 09/01/1983  . Smokeless tobacco: Never Used  . Alcohol Use: No  . Drug Use: No  . Sexual Activity: No   Other Topics Concern  . Not on file   Social History Narrative  . No narrative on file     ROS: [x]  Positive   [ ]  Negative   [ ]  All sytems reviewed and are negative  Cardiovascular: []  chest pain/pressure []  palpitations []  SOB lying flat []  DOE []  pain in legs while walking []  pain in feet when lying flat []  hx of DVT []  hx of phlebitis []  swelling in legs []  varicose veins  Pulmonary: [x]  productive cough []  asthma []  wheezing  Neurologic: [x]  weakness in arms legs []  numbness in []  arms []   legs [] difficulty speaking or slurred speech []  temporary loss of vision in one eye []  dizziness  Hematologic: [x]  bleeding problems-hx of GIB []  problems with blood clotting easily  GI []  vomiting blood []  blood in stool  GU: []  burning with urination []  blood in urine  Psychiatric: []  hx of major depression  Integumentary: []  rashes []  ulcers  Constitutional: []  fever []  chills   PHYSICAL EXAMINATION:  Filed Vitals:   11/12/12 1238  BP: 142/73  Pulse: 82  Resp:    Body mass index is 30.44 kg/(m^2).  General:  WDWN in NAD Gait: Normal HENT: WNL; normocephalic; well healed bilateral CEA incisions; small lymph node left neck, which is soft and mobile Eyes: PERRL Pulmonary: normal non-labored breathing , without Rales, rhonchi,  wheezing Cardiac: RRR, without  Murmurs, rubs or gallops; without carotid bruits Abdomen: soft, NT, no masses Skin: without rashes,  ulcers  Vascular Exam/Pulses: + palpable radial pulses bilaterally; + palpable PT pulses bilaterally Extremities: without ischemic changes, without Gangrene , without cellulitis; without open wounds;  Musculoskeletal: without muscle wasting or atrophy  Neurologic: A&O X 3; Appropriate Affect ; SENSATION: normal; MOTOR FUNCTION:  moving all extremities equally. Speech is fluent/normal   Non-Invasive Vascular Imaging: Carotid Duplex Scan:  11/12/2012  1.  Patent right CEA site with evidence of mild hyperplasia in the proximal ICA 2.  Patent left CEA site with plaque formations .  Velocities are suggestive of a < 40% stenosis. --no significant change in comparison to the previous exam performed on 02/15/10   ASSESSMENT: 79 y.o. female here for f/u carotid duplex  scan. Her duplex scan today reveals patent ICA bilaterally.  PLAN: -pt is doing well.  We will have her f/u in one year with carotid duplex scan. -continue to monitor left neck lymph node-it is soft and mobile-if it changes-will need to have follow  up.   Doreatha Massed, PA-C [facility] [phone]  Clinic MD:   Pt seen and examined in conjunction with Dr. Myra Gianotti  For VQI Use Only    PRE-ADM LIVING: [ X] Home, [ ]  Nursing home, [ ]  Homeless  AMB STATUS: [x]  Walking, [ ]  Walking w/ Assistance, [ ]  Wheelchair, [ ] Bed ridden  RECENT HEART ATTACK (<6 mon): No  CAD Sx: [x ] No, [ ]  Asx, h/o MI, [ ]  Stable angina, [ ]  Unstable angina  PRIOR CHF: [ ]  No, [ ]  Asx, [x ] Mild-BLE edema 1+, [ ]  Moderate, [ ]  Severe  STRESS TEST: [ X] No, [ ]  Normal, [ ]  + ischemia, [ ]  + MI, [ ]  Both    I agree with the above. The patient is doing well status post bilateral carotid endarterectomy with redo on the right. Ultrasound today shows minimal recurrence. She is concerned about a nodule on her left sternocleidomastoid which is less than 3 mm and feels more like a lymph node. I told her not to her about this and wants to get bigger. She will be scheduled to followup with [place] in one year with a repeat carotid ultrasound  Wells Brabham

## 2012-11-16 ENCOUNTER — Other Ambulatory Visit: Payer: Self-pay

## 2012-11-16 MED ORDER — AMIODARONE HCL 100 MG PO TABS: 100.0000 mg | ORAL_TABLET | Freq: Every day | ORAL | Status: DC

## 2013-04-29 ENCOUNTER — Other Ambulatory Visit: Payer: Self-pay | Admitting: Surgery

## 2013-04-29 DIAGNOSIS — I6529 Occlusion and stenosis of unspecified carotid artery: Secondary | ICD-10-CM

## 2013-04-29 DIAGNOSIS — Z48812 Encounter for surgical aftercare following surgery on the circulatory system: Secondary | ICD-10-CM

## 2013-07-06 ENCOUNTER — Emergency Department (HOSPITAL_COMMUNITY)
Admission: EM | Admit: 2013-07-06 | Discharge: 2013-07-06 | Disposition: A | Discharge status: Home / Self Care | Payer: Medicare Other | Attending: Emergency Medicine | Admitting: Emergency Medicine

## 2013-07-06 ENCOUNTER — Encounter (HOSPITAL_COMMUNITY): Payer: Self-pay | Admitting: Emergency Medicine

## 2013-07-06 ENCOUNTER — Emergency Department (HOSPITAL_COMMUNITY): Payer: Medicare Other

## 2013-07-06 DIAGNOSIS — G8929 Other chronic pain: Secondary | ICD-10-CM | POA: Insufficient documentation

## 2013-07-06 DIAGNOSIS — D649 Anemia, unspecified: Secondary | ICD-10-CM | POA: Insufficient documentation

## 2013-07-06 DIAGNOSIS — K219 Gastro-esophageal reflux disease without esophagitis: Secondary | ICD-10-CM | POA: Insufficient documentation

## 2013-07-06 DIAGNOSIS — R110 Nausea: Secondary | ICD-10-CM | POA: Insufficient documentation

## 2013-07-06 DIAGNOSIS — J069 Acute upper respiratory infection, unspecified: Secondary | ICD-10-CM | POA: Insufficient documentation

## 2013-07-06 DIAGNOSIS — M129 Arthropathy, unspecified: Secondary | ICD-10-CM | POA: Insufficient documentation

## 2013-07-06 DIAGNOSIS — IMO0002 Reserved for concepts with insufficient information to code with codable children: Secondary | ICD-10-CM | POA: Insufficient documentation

## 2013-07-06 DIAGNOSIS — M109 Gout, unspecified: Secondary | ICD-10-CM | POA: Insufficient documentation

## 2013-07-06 DIAGNOSIS — J441 Chronic obstructive pulmonary disease with (acute) exacerbation: Secondary | ICD-10-CM | POA: Insufficient documentation

## 2013-07-06 DIAGNOSIS — Z8701 Personal history of pneumonia (recurrent): Secondary | ICD-10-CM | POA: Insufficient documentation

## 2013-07-06 DIAGNOSIS — E785 Hyperlipidemia, unspecified: Secondary | ICD-10-CM | POA: Insufficient documentation

## 2013-07-06 DIAGNOSIS — N189 Chronic kidney disease, unspecified: Secondary | ICD-10-CM | POA: Insufficient documentation

## 2013-07-06 DIAGNOSIS — Z8619 Personal history of other infectious and parasitic diseases: Secondary | ICD-10-CM | POA: Insufficient documentation

## 2013-07-06 DIAGNOSIS — Z7982 Long term (current) use of aspirin: Secondary | ICD-10-CM | POA: Insufficient documentation

## 2013-07-06 DIAGNOSIS — M810 Age-related osteoporosis without current pathological fracture: Secondary | ICD-10-CM | POA: Insufficient documentation

## 2013-07-06 DIAGNOSIS — I509 Heart failure, unspecified: Secondary | ICD-10-CM | POA: Insufficient documentation

## 2013-07-06 DIAGNOSIS — I2510 Atherosclerotic heart disease of native coronary artery without angina pectoris: Secondary | ICD-10-CM | POA: Insufficient documentation

## 2013-07-06 DIAGNOSIS — R42 Dizziness and giddiness: Secondary | ICD-10-CM | POA: Insufficient documentation

## 2013-07-06 DIAGNOSIS — Z79899 Other long term (current) drug therapy: Secondary | ICD-10-CM | POA: Insufficient documentation

## 2013-07-06 DIAGNOSIS — Z87891 Personal history of nicotine dependence: Secondary | ICD-10-CM | POA: Insufficient documentation

## 2013-07-06 DIAGNOSIS — Z880 Allergy status to penicillin: Secondary | ICD-10-CM | POA: Insufficient documentation

## 2013-07-06 DIAGNOSIS — I129 Hypertensive chronic kidney disease with stage 1 through stage 4 chronic kidney disease, or unspecified chronic kidney disease: Secondary | ICD-10-CM | POA: Insufficient documentation

## 2013-07-06 LAB — URINALYSIS, ROUTINE W REFLEX MICROSCOPIC
Bilirubin Urine: NEGATIVE
Glucose, UA: NEGATIVE mg/dL
Hgb urine dipstick: NEGATIVE
Ketones, ur: NEGATIVE mg/dL
LEUKOCYTES UA: NEGATIVE
NITRITE: NEGATIVE
PH: 7.5 (ref 5.0–8.0)
Protein, ur: NEGATIVE mg/dL
SPECIFIC GRAVITY, URINE: 1.009 (ref 1.005–1.030)
UROBILINOGEN UA: 0.2 mg/dL (ref 0.0–1.0)

## 2013-07-06 LAB — CBC WITH DIFFERENTIAL/PLATELET
BASOS PCT: 1 % (ref 0–1)
Basophils Absolute: 0.1 10*3/uL (ref 0.0–0.1)
EOS ABS: 0.1 10*3/uL (ref 0.0–0.7)
Eosinophils Relative: 1 % (ref 0–5)
HCT: 42.2 % (ref 36.0–46.0)
Hemoglobin: 14.3 g/dL (ref 12.0–15.0)
Lymphocytes Relative: 18 % (ref 12–46)
Lymphs Abs: 1.8 10*3/uL (ref 0.7–4.0)
MCH: 31.6 pg (ref 26.0–34.0)
MCHC: 33.9 g/dL (ref 30.0–36.0)
MCV: 93.4 fL (ref 78.0–100.0)
Monocytes Absolute: 0.6 10*3/uL (ref 0.1–1.0)
Monocytes Relative: 6 % (ref 3–12)
NEUTROS ABS: 7.5 10*3/uL (ref 1.7–7.7)
NEUTROS PCT: 75 % (ref 43–77)
PLATELETS: 221 10*3/uL (ref 150–400)
RBC: 4.52 MIL/uL (ref 3.87–5.11)
RDW: 13.5 % (ref 11.5–15.5)
WBC: 10 10*3/uL (ref 4.0–10.5)

## 2013-07-06 LAB — BASIC METABOLIC PANEL
BUN: 19 mg/dL (ref 6–23)
CHLORIDE: 97 meq/L (ref 96–112)
CO2: 28 mEq/L (ref 19–32)
Calcium: 9.5 mg/dL (ref 8.4–10.5)
Creatinine, Ser: 1.02 mg/dL (ref 0.50–1.10)
GFR, EST AFRICAN AMERICAN: 56 mL/min — AB (ref >90)
GFR, EST NON AFRICAN AMERICAN: 48 mL/min — AB (ref >90)
Glucose, Bld: 110 mg/dL — ABNORMAL HIGH (ref 70–99)
POTASSIUM: 4.5 meq/L (ref 3.7–5.3)
SODIUM: 138 meq/L (ref 137–147)

## 2013-07-06 MED ORDER — MECLIZINE HCL 25 MG PO TABS: 25.0000 mg | ORAL_TABLET | Freq: Two times a day (BID) | ORAL | Status: DC | PRN

## 2013-07-06 MED ORDER — MECLIZINE HCL 25 MG PO TABS
25.0000 mg | ORAL_TABLET | Freq: Once | ORAL | Status: AC
  Administered 2013-07-06: 25 mg via ORAL
  Filled 2013-07-06: qty 1

## 2013-07-06 NOTE — ED Notes (Signed)
Unsuccessful IV attempt x2 by this RN. Enid Cutter at bedside attempting IV insertion.

## 2013-07-06 NOTE — ED Provider Notes (Signed)
CSN: [id number]     Arrival date & time 07/06/13  [id number] History   First MD Initiated Contact with Patient 07/06/13 0930     Chief Complaint  Patient presents with  . Dizziness  . Nausea     (Consider location/radiation/quality/duration/timing/severity/associated sxs/prior Treatment) HPI  This is an 80 year old female with history of hypertension, hyperlipidemia, COPD, coronary artery disease, atrial fibrillation, and vertigo who presents with nausea and dizziness. Patient reports onset of symptoms last night. She reports room spinning dizziness. She denies any weakness, difficulty ambulating, numbness, difficulty speaking. Patient reports that she recently has had cough productive of yellow sputum and was given a new allergy medication (fenlafaxine) which she's been taking for over a week.  That is the only medication. Patient reports single episode of vertigo several years ago. She denies any history of stroke.  Patient endorses chills without fever. She denies any chest pain or shortness of breath.  Past Medical History  Diagnosis Date  . Hypertension   . Hyperlipidemia   . COPD (chronic obstructive pulmonary disease)   . Osteoporosis   . Arthritis   . H/O hiatal hernia   . Chronic back pain   . CAD (coronary artery disease)     LHC 9/05:  pLAD less than 20%, D1 20-30%, ostial RCA 40-50%, proximal RCA 20%, EF 60%.  . C. difficile diarrhea   . Gout   . Atrial fibrillation     a. failed DCCV => b. amiodarone added => converted to NSR on Amiodarone;  c. Xarelto d/c'd 2/2 falling (wrist and hip Fx in 1/14)  . Carotid stenosis     s/p bilat CEA  . C. difficile colitis     10/2011;  11/2011  . Salmonella enteritis 08/2011    c/b septic shock  . Hx of echocardiogram     a. Echocardiogram 09/29/11: Mild LVH, EF 45-50%, diffuse HK, mild to moderate aortic stenosis, mean gradient 12 mmHg, moderate MAC, mild MR, moderate LAE, moderate RAE, question small secundum ASD with left to right flow not  seen in 4 chamber view, PASP 35-39 (mild pulmonary hypertension). ;  b.  TEE on 10/04/11: Mild LVH, EF 55%, mild MR, no defect or PFO  . Pneumonia 11/2101  . PONV (postoperative nausea and vomiting)   . CHF (congestive heart failure)   . Chronic kidney disease   . GERD (gastroesophageal reflux disease)   . Hepatitis   . Anemia    Past Surgical History  Procedure Laterality Date  . Carotid endarterectomy      bilateral  . Cataract extraction, bilateral    . Joint replacement  2008    Right Total Knee  . Anal fistula repair    . Left parotidectomy    . Eye surgery    . Flexible sigmoidoscopy  09/02/2011    Procedure: FLEXIBLE SIGMOIDOSCOPY;  Surgeon: Beryle Beams, MD;  Location: [facility] ENDOSCOPY;  Service: Endoscopy;  Laterality: N/A;  . Flexible sigmoidoscopy  09/03/2011    Procedure: FLEXIBLE SIGMOIDOSCOPY;  Surgeon: Beryle Beams, MD;  Location: [facility]COPY;  Service: Endoscopy;  Laterality: N/A;  . Tee without cardioversion  10/04/2011    Procedure: TRANSESOPHAGEAL ECHOCARDIOGRAM (TEE);  Surgeon: Larey Dresser, MD;  Location: [place];  Service: Cardiovascular;  Laterality: N/A;  to be carelinked here by 1230-verified 7/29/dl  . Cardioversion  10/04/2011    Procedure: CARDIOVERSION;  Surgeon: Larey Dresser, MD;  Location: [facility] ENDOSCOPY;  Service: Cardiovascular;  Laterality: N/A;  . Esophagogastroduodenoscopy  11/29/2011    Procedure: ESOPHAGOGASTRODUODENOSCOPY (EGD);  Surgeon: Cleotis Nipper, MD;  Location: [facility];  Service: Endoscopy;  Laterality: N/A;  recent h/o resp failure/pneumonia  . Left hip fracture repair  03/14/12  . Open reduction internal fixation (orif) scaphoid with distal radius graft  03/14/2012    Procedure: OPEN REDUCTION INTERNAL FIXATION (ORIF) SCAPHOID WITH DISTAL RADIUS GRAFT;  Surgeon: Jolyn Nap, MD;  Location: [facility];  Service: Orthopedics;  Laterality: Left;  DVR wrist fracture set/ hand innovation  . Hip pinning,cannulated  03/14/2012     Procedure: CANNULATED HIP PINNING;  Surgeon: Jolyn Nap, MD;  Location: [facility];  Service: Orthopedics;  Laterality: Left;  Biomet 6.5 cannulated screws  . Femur im nail Left 04/13/2012    Procedure: INTRAMEDULLARY (IM) NAIL FEMORAL;  Surgeon: Jolyn Nap, MD;  Location: [place];  Service: Orthopedics;  Laterality: Left;  OPEN REDUCTION INTERNAL FIXATION LEFT PROXIMAL FEMUR Fracture    Family History  Problem Relation Age of Onset  . Hemochromatosis Sister   . Diabetes Sister   . Hypertension Sister   . Diabetes type II Mother   . Diabetes Mother   . Hypertension Mother   . Deep vein thrombosis Mother   . Hypertension Father   . Hypertension Brother   . Other Brother     Leg amputation-Gun shot  . Depression Brother   . Hemochromatosis Sister    History  Substance Use Topics  . Smoking status: Former Smoker -- 1.00 packs/day for 20 years    Types: Cigarettes    Quit date: 09/01/1983  . Smokeless tobacco: Never Used  . Alcohol Use: No   OB History   Grav Para Term Preterm Abortions TAB SAB Ect Mult Living                 Review of Systems  Constitutional: Positive for chills. Negative for fever.  Respiratory: Positive for cough. Negative for chest tightness and shortness of breath.   Cardiovascular: Negative for chest pain.  Gastrointestinal: Positive for nausea. Negative for vomiting and abdominal pain.  Genitourinary: Negative for dysuria.  Musculoskeletal: Negative for back pain.  Skin: Negative for wound.  Neurological: Positive for dizziness. Negative for syncope, facial asymmetry, speech difficulty, weakness, light-headedness, numbness and headaches.  Psychiatric/Behavioral: Negative for confusion.  All other systems reviewed and are negative.     Allergies  Baby powder; Demerol; and Penicillins  Home Medications   Prior to Admission medications   Medication Sig Start Date End Date Taking? Authorizing Provider  allopurinol (ZYLOPRIM) 100 MG tablet  Take 100 mg by mouth daily.     Historical Provider, MD  amiodarone (PACERONE) 100 MG tablet Take 1 tablet (100 mg total) by mouth daily. 11/16/12   Burnell Blanks, MD  amLODipine (NORVASC) 5 MG tablet Take 1 tablet (5 mg total) by mouth daily. 07/19/12 07/19/13  Burnell Blanks, MD  aspirin EC 325 MG tablet Take 325 mg by mouth daily.    Historical Provider, MD  atorvastatin (LIPITOR) 40 MG tablet TAKE 1 TABLET BY MOUTH EVERY DAY 09/25/12   Burnell Blanks, MD  budesonide-formoterol [facility]) 160-4.5 MCG/ACT inhaler Inhale 2 puffs into the lungs 2 (two) times daily.    Historical Provider, MD  ferrous fumarate (HEMOCYTE - 106 MG FE) 325 (106 FE) MG TABS Take 1 tablet by mouth 2 (two) times daily.    Historical Provider, MD  ferrous sulfate 325 (65 FE) MG tablet Take 325 mg by mouth  2 (two) times daily with a meal. Am and pm    Historical Provider, MD  fish oil-omega-3 fatty acids 1000 MG capsule Take 1 g by mouth 2 (two) times daily.     Historical Provider, MD  Flaxseed, Linseed, (FLAX SEED OIL PO) Take 1 capsule by mouth 2 (two) times daily.    Historical Provider, MD  furosemide (LASIX) 20 MG tablet Take 20 mg by mouth daily.    Historical Provider, MD  HYDROcodone-acetaminophen (NORCO/VICODIN) 5-325 MG per tablet Take 1-2 tablets by mouth every 6 (six) hours as needed. 04/13/12   Jolyn Nap, MD  levalbuterol [facility] HFA) 45 MCG/ACT inhaler Inhale 1-2 puffs into the lungs every 4 (four) hours as needed. Wheezing    Historical Provider, MD  loratadine (CLARITIN) 10 MG tablet Take 10 mg by mouth daily.    Historical Provider, MD  Omega-3 Fat Ac-Cholecalciferol (MINICAPS VITAMIN-D/OMEGA-3 PO) Take 4,000 mg by mouth daily.    Historical Provider, MD  omeprazole (PRILOSEC) 20 MG capsule Take 20 mg by mouth daily.    Historical Provider, MD  polyethylene glycol powder (MIRALAX) powder Take by mouth daily. 8 ounces once daily    Historical Provider, MD  raloxifene (EVISTA) 60  MG tablet Take 60 mg by mouth daily.    Historical Provider, MD  saccharomyces boulardii (FLORASTOR) 250 MG capsule Take 250 mg by mouth 2 (two) times daily.    Historical Provider, MD   BP 161/86  Pulse 86  Temp(Src) 98 F (36.7 C) (Oral)  Resp 20  SpO2 95% Physical Exam  Nursing note and vitals reviewed. Constitutional: She is oriented to person, place, and time. She appears well-developed and well-nourished. No distress.  Elderly  HENT:  Head: Normocephalic and atraumatic.  Right Ear: External ear normal.  Left Ear: External ear normal.  Bilateral TMs with good light reflexes  Eyes: Pupils are equal, round, and reactive to light.  Horizontal nystagmus noted with extraocular movements, extraocular movements induce vertigo  Neck: Neck supple.  Cardiovascular: Normal rate, regular rhythm and normal heart sounds.   No murmur heard. Pulmonary/Chest: Effort normal. No respiratory distress. She has wheezes.  Scant expiratory wheeze diffusely  Abdominal: Soft. Bowel sounds are normal. There is no tenderness. There is no rebound.  Musculoskeletal: She exhibits no edema.  Neurological: She is alert and oriented to person, place, and time.  Cranial nerves II through XII intact, no dysmetria noted to finger-nose-finger, 5 out of 5 strength in the bilateral upper and lower extremity as, no clonus noted, fluent speech  Skin: Skin is warm and dry.  Psychiatric: She has a normal mood and affect.    ED Course  Procedures (including critical care time) Labs Review Labs Reviewed  BASIC METABOLIC PANEL - Abnormal; Notable for the following:    Glucose, Bld 110 (*)    GFR calc non Af Amer 48 (*)    GFR calc Af Amer 56 (*)    All other components within normal limits  CBC WITH DIFFERENTIAL  URINALYSIS, ROUTINE W REFLEX MICROSCOPIC    Imaging Review Dg Chest 2 View  07/06/2013   CLINICAL DATA:  Vertigo and weakness.  EXAM: CHEST  2 VIEW  COMPARISON:  DG CHEST 1V PORT dated 04/12/2012 .   FINDINGS: Mediastinum and hilar structures are normal. Cardiomegaly, normal pulmonary vascularity. No pleural effusion or pneumothorax. Surgical clips noted in the neck.  IMPRESSION: 1. Stable cardiomegaly, no CHF. 2. No acute cardiopulmonary disease.   Electronically Signed   By:  [place]   On: 07/06/2013 10:49     EKG Interpretation   Date/Time:  Saturday Jul 06 2013 13:14:56 EDT Ventricular Rate:  80 PR Interval:  212 QRS Duration: 88 QT Interval:  403 QTC Calculation: 465 R Axis:   9 Text Interpretation:  Sinus rhythm Premature ventricular complexes  Borderline prolonged PR interval Confirmed by HORTON  MD, COURTNEY ([id number])  on 07/06/2013 1:34:52 PM      MDM   Final diagnoses:  Vertigo  URI (upper respiratory infection)    Patient presents with dizziness and nausea. History of vertigo. Patient is nontoxic-appearing on exam. Neurologic exam is within normal limits and no evidence of cerebellar dysfunction.  Symptoms are worse with head movements and eye movement. Nystagmus noted.  Given recent cold symptoms, could have triggered peripheral vertigo.  Patient was given meclizine with improvement of her nausea and dizziness. Basic labwork was obtained and is reassuring. Urinalysis and chest x-ray without evidence of infection. At this time have low suspicion for central etiology of vertigo including Septra. Discussed this with the patient and her daughter. We'll send with a course of meclizine at home. She is to followup with her primary physician.  After history, exam, and medical workup I feel the patient has been appropriately medically screened and is safe for discharge home. Pertinent diagnoses were discussed with the patient. Patient was given return precautions.     Merryl Hacker, MD 07/06/13 1335

## 2013-07-06 NOTE — Discharge Instructions (Signed)
Vertigo Vertigo means you feel like you or your surroundings are moving when they are not. Vertigo can be dangerous if it occurs when you are at work, driving, or performing difficult activities.  CAUSES  Vertigo occurs when there is a conflict of signals sent to your brain from the visual and sensory systems in your body. There are many different causes of vertigo, including:  Infections, especially in the inner ear.  A bad reaction to a drug or misuse of alcohol and medicines.  Withdrawal from drugs or alcohol.  Rapidly changing positions, such as lying down or rolling over in bed.  A migraine headache.  Decreased blood flow to the brain.  Increased pressure in the brain from a head injury, infection, tumor, or bleeding. SYMPTOMS  You may feel as though the world is spinning around or you are falling to the ground. Because your balance is upset, vertigo can cause nausea and vomiting. You may have involuntary eye movements (nystagmus). DIAGNOSIS  Vertigo is usually diagnosed by physical exam. If the cause of your vertigo is unknown, your caregiver may perform imaging tests, such as an MRI scan (magnetic resonance imaging). TREATMENT  Most cases of vertigo resolve on their own, without treatment. Depending on the cause, your caregiver may prescribe certain medicines. If your vertigo is related to body position issues, your caregiver may recommend movements or procedures to correct the problem. In rare cases, if your vertigo is caused by certain inner ear problems, you may need surgery. HOME CARE INSTRUCTIONS   Follow your caregiver's instructions.  Avoid driving.  Avoid operating heavy machinery.  Avoid performing any tasks that would be dangerous to you or others during a vertigo episode.  Tell your caregiver if you notice that certain medicines seem to be causing your vertigo. Some of the medicines used to treat vertigo episodes can actually make them worse in some people. SEEK  IMMEDIATE MEDICAL CARE IF:   Your medicines do not relieve your vertigo or are making it worse.  You develop problems with talking, walking, weakness, or using your arms, hands, or legs.  You develop severe headaches.  Your nausea or vomiting continues or gets worse.  You develop visual changes.  A family member notices behavioral changes.  Your condition gets worse. MAKE SURE YOU:  Understand these instructions.  Will watch your condition.  Will get help right away if you are not doing well or get worse. Document Released: 12/01/2004 Document Revised: 05/16/2011 Document Reviewed: 09/09/2010 [employer] Patient Information 2014 [employer].  

## 2013-07-06 NOTE — ED Notes (Signed)
Pt from home c/o dizziness with nausea since last pm. Pt was seen at PCP for cold s/sx and given allergy meds. Pt denies congestion, ear pain, but reports productive cough with yellow sputum. Pt has hx of vertigo and only med change has been allergy medication. Pt denies fall, LOC, or visual changes. Pt is A&O and in NAD

## 2013-07-08 IMAGING — CT CT ABD-PELV W/ CM
1 of 3 series · 14 of 32 positions shown, 19 images · IV contrast (OMNIPAQUE 300)
Comparison: None.

CLINICAL DATA: Lethargy, nausea, vomiting, diarrhea, and
hypotension.

CT ABDOMEN AND PELVIS WITH CONTRAST
TECHNIQUE: Multidetector CT imaging of the abdomen and pelvis was
performed following the standard protocol during bolus
administration of intravenous contrast.
Contrast: 100mL OMNIPAQUE IOHEXOL 300 MG/ML  SOLN

[Series 2: abd/pel with · axial · 0.74mm/px · z∈[+1114,+1470]mm · 14 of 81 slices shown, 19 images]
[im 5/81  soft-tissue]
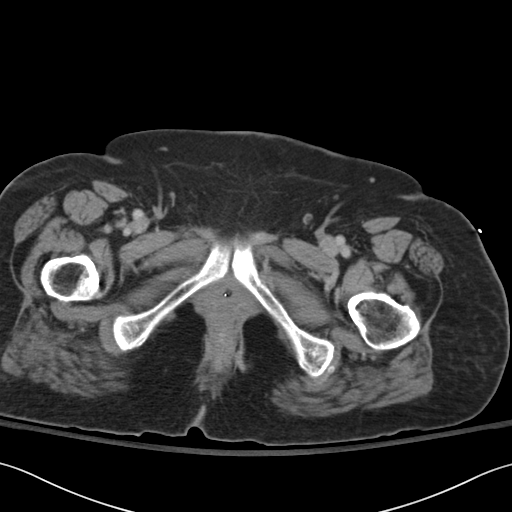
[im 5/81  bone]
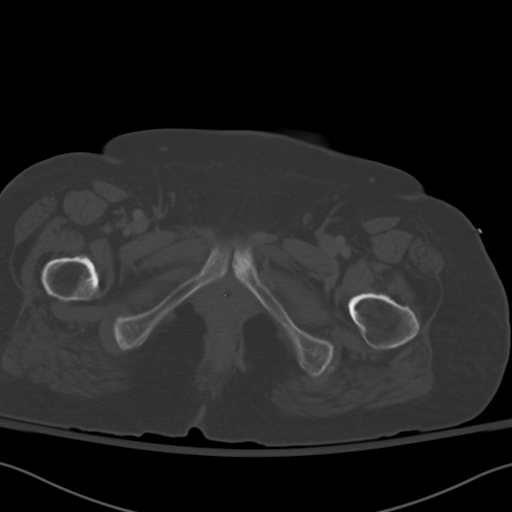
[im 13/81  soft-tissue]
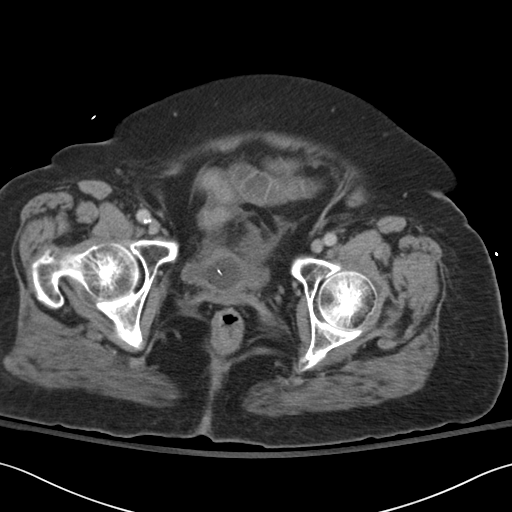
[im 17/81  soft-tissue]
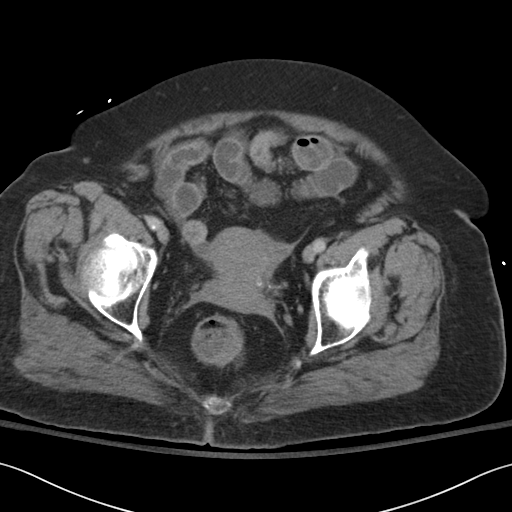
[im 22/81  soft-tissue]
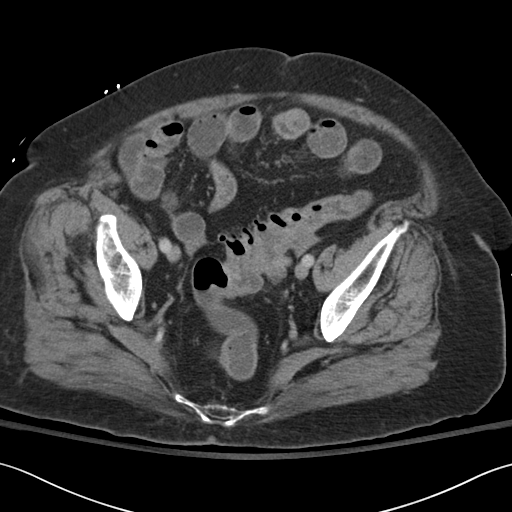
[im 30/81  soft-tissue]
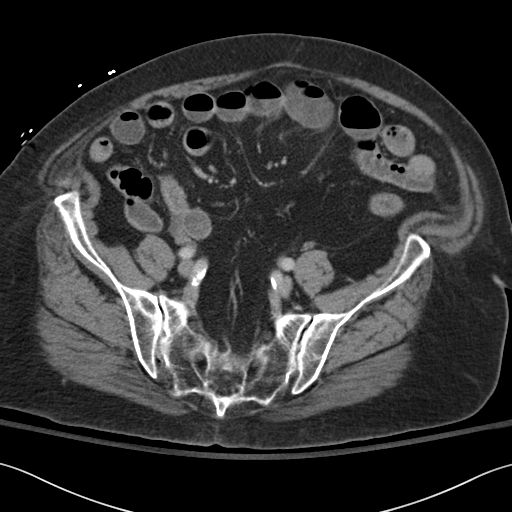
[im 34/81  soft-tissue]
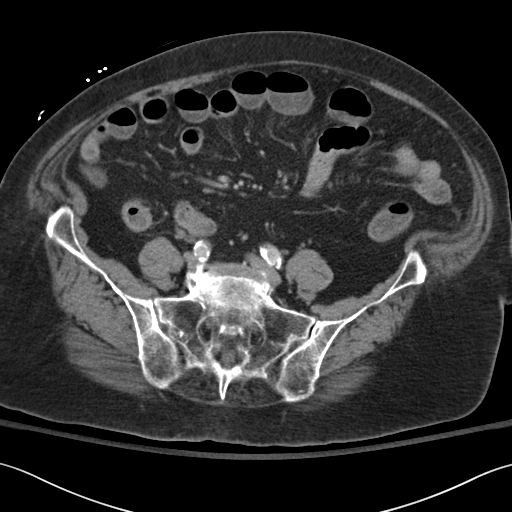
[im 43/81  soft-tissue]
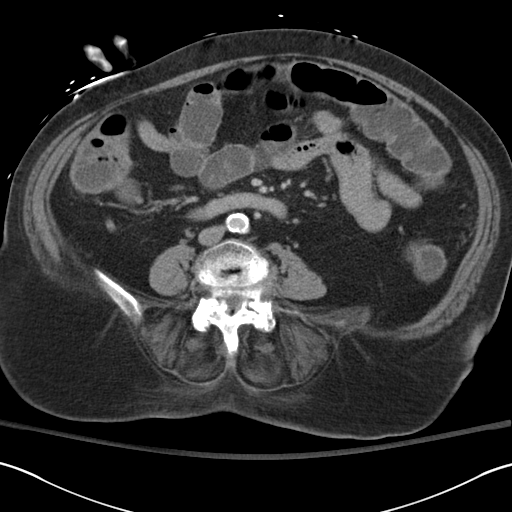
[im 47/81  soft-tissue]
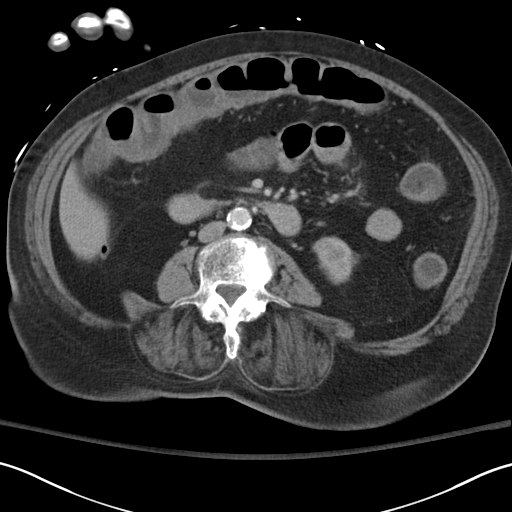
[im 51/81  soft-tissue]
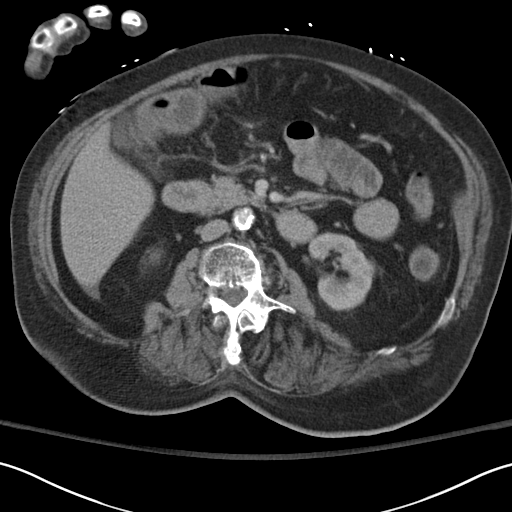
[im 51/81  bone]
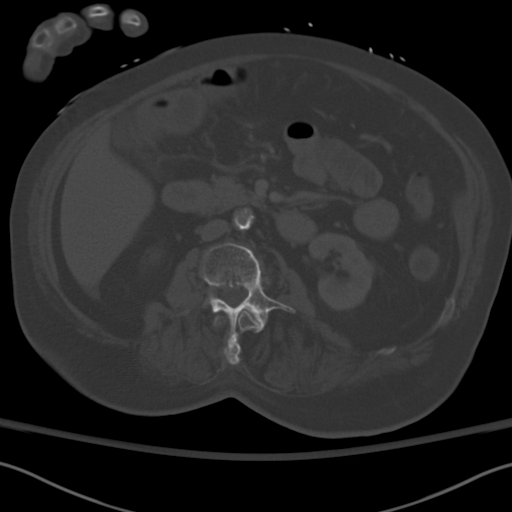
[im 59/81  soft-tissue]
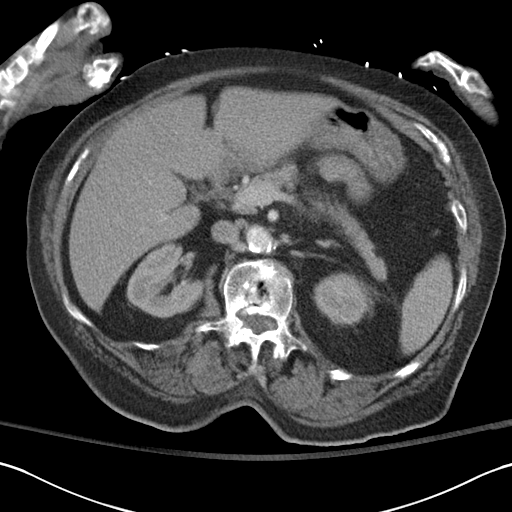
[im 64/81  soft-tissue]
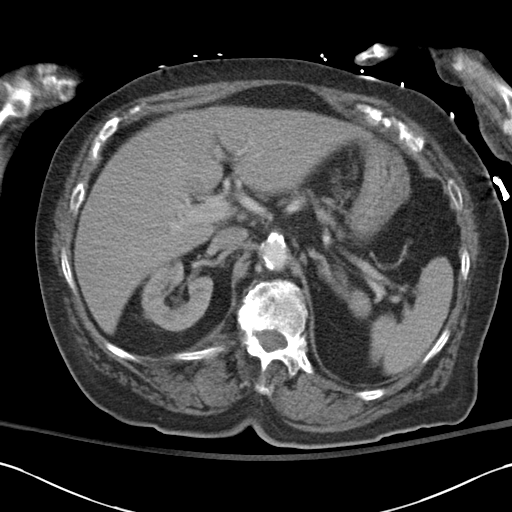
[im 64/81  lung]
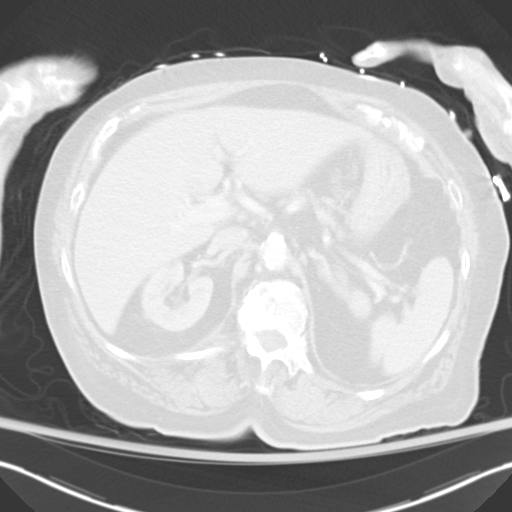
[im 68/81  soft-tissue]
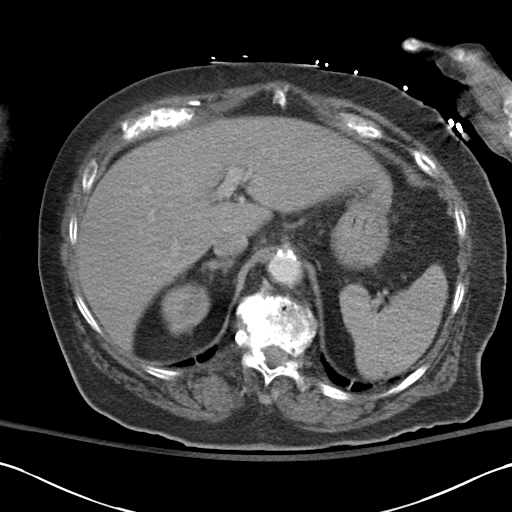
[im 68/81  lung]
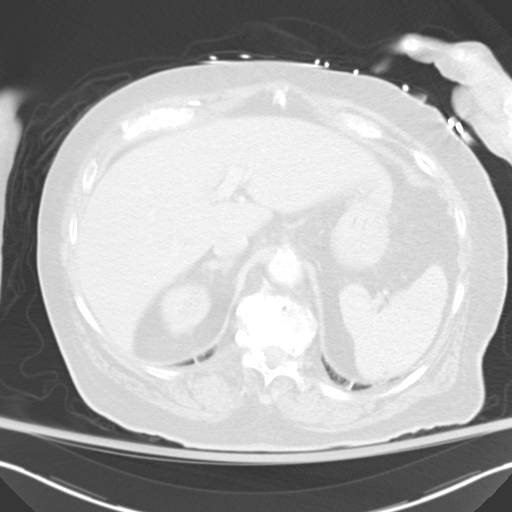
[im 72/81  lung]
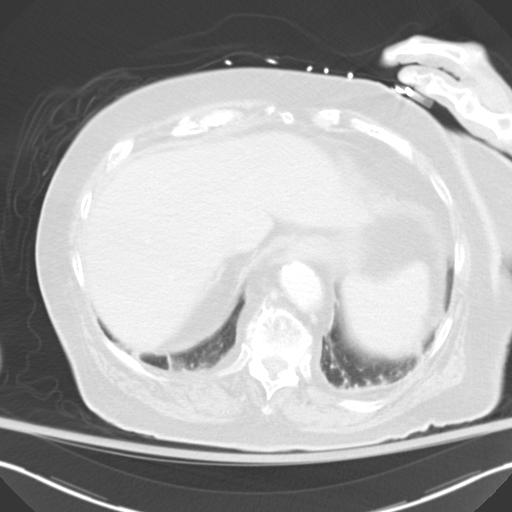
[im 76/81  soft-tissue]
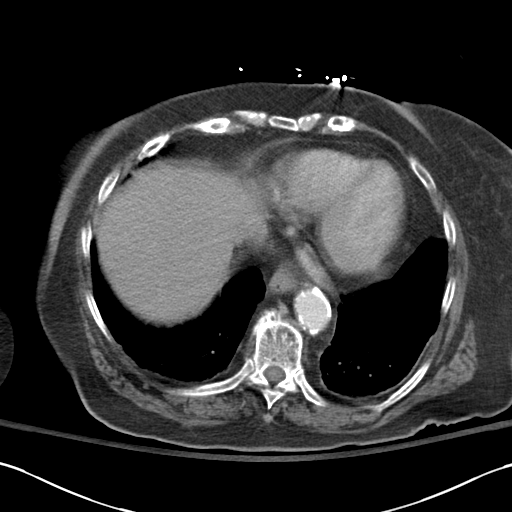
[im 76/81  lung]
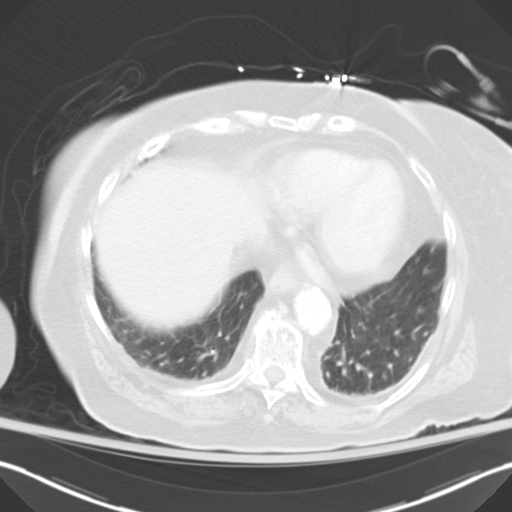

[14 of 32 positions shown; findings below may reference images not displayed]

FINDINGS: Mild dependent atelectasis in the lung bases.  Probable
cardiac enlargement.

The liver, spleen, gallbladder, pancreas, adrenal glands, kidneys,
and retroperitoneal lymph nodes are unremarkable.  Diffuse
calcification and tortuosity of the aorta without aneurysm.  Flow
is demonstrated in the mesenteric and portal vessels.  There is a
small amount of fluid around the posterior liver edge on the right.
The stomach and small bowel are decompressed.  Prominent visceral
adipose tissues.  No free fluid or free air in the abdomen.  The
colon is not distended but is fluid-filled consistent with liquid
stool.  There is suggestion of mild diffuse colonic wall thickening
suggesting edema or colitis.  Infectious or inflammatory colitis
could have this appearance.

Pelvis:  The uterus is anteverted.  Uterus and adnexal structures
are not enlarged.  Minimal free fluid in the pelvis.  Foley
catheter decompresses the bladder.  No loculated pelvic fluid
collections.  Diverticula in the sigmoid colon without
diverticulitis.  The appendix is normal.  Extensive degenerative
changes throughout the lumbar spine with mild lumbar scoliosis.
Mild anterior subluxation of L4 on L5.
IMPRESSION: Mild diffuse wall thickening and liquid stool in the colon
suggesting infectious or inflammatory process.  Minimal fluid
around the right liver edge and in the pelvis.  No evidence of
focal mass or abscess.

## 2013-08-19 ENCOUNTER — Telehealth: Payer: Self-pay | Admitting: Cardiovascular Disease

## 2013-08-19 NOTE — Telephone Encounter (Signed)
I spoke with pt about the occasional left chest discomfort. States she is having trouble with bronchitis & has been coughing. States that when she has the pain "I feel like I smother"  Denies any chest discomfort, no dizziness. States she did recently have an allergic reaction to an inhaler she took ( does not recall name but is not symbicort) States EMS had been called & the did an ecg & "said my heart was normal"  She took light excerise this morning & is doing housework. States she is feeling okay.  appt made for 09/12/13 with Teryl Lucy Horton Chin RN

## 2013-08-19 NOTE — Telephone Encounter (Signed)
New problem   Pt has bronchitis and have occasionally chest pain, but isn't having them now. Please call pt.

## 2013-08-19 NOTE — Telephone Encounter (Signed)
Agree. cdm 

## 2013-09-12 ENCOUNTER — Ambulatory Visit (INDEPENDENT_AMBULATORY_CARE_PROVIDER_SITE_OTHER): Payer: Medicare Other | Admitting: Physician Assistant

## 2013-09-12 ENCOUNTER — Encounter: Payer: Self-pay | Admitting: Physician Assistant

## 2013-09-12 VITALS — BP 140/77 | HR 79 | Ht 61.0 in | Wt 173.0 lb

## 2013-09-12 DIAGNOSIS — I4891 Unspecified atrial fibrillation: Secondary | ICD-10-CM

## 2013-09-12 DIAGNOSIS — I350 Nonrheumatic aortic (valve) stenosis: Secondary | ICD-10-CM

## 2013-09-12 DIAGNOSIS — Z79899 Other long term (current) drug therapy: Secondary | ICD-10-CM

## 2013-09-12 DIAGNOSIS — R0602 Shortness of breath: Secondary | ICD-10-CM

## 2013-09-12 DIAGNOSIS — I10 Essential (primary) hypertension: Secondary | ICD-10-CM

## 2013-09-12 DIAGNOSIS — I2510 Atherosclerotic heart disease of native coronary artery without angina pectoris: Secondary | ICD-10-CM

## 2013-09-12 DIAGNOSIS — I4819 Other persistent atrial fibrillation: Secondary | ICD-10-CM

## 2013-09-12 DIAGNOSIS — J449 Chronic obstructive pulmonary disease, unspecified: Secondary | ICD-10-CM

## 2013-09-12 DIAGNOSIS — R079 Chest pain, unspecified: Secondary | ICD-10-CM

## 2013-09-12 DIAGNOSIS — I359 Nonrheumatic aortic valve disorder, unspecified: Secondary | ICD-10-CM

## 2013-09-12 DIAGNOSIS — I5042 Chronic combined systolic (congestive) and diastolic (congestive) heart failure: Secondary | ICD-10-CM

## 2013-09-12 NOTE — Patient Instructions (Signed)
Your physician recommends that you continue on your current medications as directed. Please refer to the Current Medication list given to you today.   Your physician recommends that you return for lab work in: TODAY BMET, TSH, LFT, BNP  Your physician has requested that you have an echocardiogram. Echocardiography is a painless test that uses sound waves to create images of your heart. It provides your doctor with information about the size and shape of your heart and how well your heart's chambers and valves are working. This procedure takes approximately one hour. There are no restrictions for this procedure.  Your physician has requested that you have a lexiscan myoview. For further information please visit [email or web address]. Please follow instruction sheet, as given.   Your physician wants you to follow-up in: [place]. You will receive a reminder letter in the mail two months in advance. If you don't receive a letter, please call our office to schedule the follow-up appointment.

## 2013-09-12 NOTE — Progress Notes (Signed)
Cardiology Office Note    Date:  09/12/2013   ID:  [name] [name], DOB 01-Nov-1923, MRN [id number]  PCP:  [name]  Cardiologist:  Dr. Lauree Chandler      History of Present Illness: [name] [name] is a 80 y.o. female with a hx of non-obstructive CAD, carotid stenosis s/p prior bilateral CEA, HTN, HL, COPD, combined systolic and diastolic CHF, AFib.  Patient had prolonged hospitalization in 08/2011 for Salmonella enteritis.  She failed TEE-DCCV in 10/2011.  She was placed on Amiodarone and converted to NSR.  She fell in 03/2012 and suffered L hip and L distal radius fracture.  Xarelto was discontinued 2/2 advanced age and instability with falling. She was ultimately taken off of Diltiazem due to bradycardia.  She was last seen 08/2012.    She was recently started on Spiriva for COPD.  She developed chest discomfort in her left chest after starting this. She also noted associated dyspnea. She stopped the medication. She has not had any significant chest discomfort since that time. She notes chronic dyspnea with exertion. She thinks that this may be worsening. She is NYHA class II-IIb. She denies orthopnea or PND. She denies any significant edema. She denies syncope. She has had a gradual weight gain.   Studies:  - LHC (11/2003):  prox LAD < 20%, prox D1 20-30%, ostial RCA 40-50%, prox RCA 20%, EF 60%.  - Echo (08/17/12):  EF 45-50%, diff HK, Gr 1 DD, mild AS (mean 12 mmHg), mild LAE  - Carotid US (11/2012):  Patent R and L CEA; LICA < 88%   Recent Labs: 07/06/2013: Creatinine 1.02; Hemoglobin 14.3; Potassium 4.5   CXR (07/06/13): IMPRESSION:  1. Stable cardiomegaly, no CHF.  2. No acute cardiopulmonary disease.   Wt Readings from Last 3 Encounters:  09/12/13 173 lb (78.472 kg)  11/12/12 161 lb (73.029 kg)  08/30/12 152 lb (68.947 kg)     Past Medical History  Diagnosis Date  . Hypertension   . Hyperlipidemia   . COPD (chronic obstructive pulmonary disease)   .  Osteoporosis   . Arthritis   . H/O hiatal hernia   . Chronic back pain   . CAD (coronary artery disease)     LHC 9/05:  pLAD less than 20%, D1 20-30%, ostial RCA 40-50%, proximal RCA 20%, EF 60%.  . C. difficile diarrhea   . Gout   . Atrial fibrillation     a. failed DCCV => b. amiodarone added => converted to NSR on Amiodarone;  c. Xarelto d/c'd 2/2 falling (wrist and hip Fx in 1/14)  . Carotid stenosis     s/p bilat CEA  . C. difficile colitis     10/2011;  11/2011  . Salmonella enteritis 08/2011    c/b septic shock  . Hx of echocardiogram     a. Echocardiogram 09/29/11: Mild LVH, EF 45-50%, diffuse HK, mild to moderate aortic stenosis, mean gradient 12 mmHg, moderate MAC, mild MR, moderate LAE, moderate RAE, question small secundum ASD with left to right flow not seen in 4 chamber view, PASP 35-39 (mild pulmonary hypertension). ;  b.  TEE on 10/04/11: Mild LVH, EF 55%, mild MR, no defect or PFO  . Pneumonia 11/2101  . PONV (postoperative nausea and vomiting)   . CHF (congestive heart failure)   . Chronic kidney disease   . GERD (gastroesophageal reflux disease)   . Hepatitis   . Anemia     Current Outpatient Prescriptions  Medication Sig Dispense Refill  .  allopurinol (ZYLOPRIM) 100 MG tablet Take 100 mg by mouth every morning.       Marland Kitchen amiodarone (PACERONE) 100 MG tablet Take 100 mg by mouth every morning.      Marland Kitchen amLODipine (NORVASC) 5 MG tablet Take 5 mg by mouth every morning.      Marland Kitchen aspirin EC 325 MG tablet Take 325 mg by mouth every morning.       Marland Kitchen atorvastatin (LIPITOR) 40 MG tablet Take 40 mg by mouth at bedtime.      . budesonide-formoterol (SYMBICORT) 160-4.5 MCG/ACT inhaler Inhale 2 puffs into the lungs 2 (two) times daily.      . cholecalciferol (VITAMIN D) 1000 UNITS tablet Take 1,000-2,000 Units by mouth 2 (two) times daily. Take 2000 units in the morning and 1000 units in the evening      . ferrous sulfate 325 (65 FE) MG tablet Take 325 mg by mouth 2 (two) times daily  with a meal.       . fish oil-omega-3 fatty acids 1000 MG capsule Take 1 g by mouth 2 (two) times daily.       . Flaxseed, Linseed, (FLAX SEED OIL PO) Take 1,000 mg by mouth 2 (two) times daily.       . furosemide (LASIX) 20 MG tablet Take 20 mg by mouth every morning.       . levalbuterol (XOPENEX HFA) 45 MCG/ACT inhaler Inhale 2 puffs into the lungs every 4 (four) hours as needed for wheezing.      . meclizine (ANTIVERT) 25 MG tablet Take 1 tablet (25 mg total) by mouth 2 (two) times daily as needed for dizziness.  30 tablet  0  . omeprazole (PRILOSEC) 20 MG capsule Take 20 mg by mouth 2 (two) times daily before a meal.       . polyethylene glycol (MIRALAX / GLYCOLAX) packet Take 17 g by mouth daily as needed for mild constipation.      . raloxifene (EVISTA) 60 MG tablet Take 60 mg by mouth every morning.        No current facility-administered medications for this visit.    Allergies:   Baby powder; Demerol; Penicillins; and Spiriva handihaler   Social History:  The patient  reports that she quit smoking about 30 years ago. Her smoking use included Cigarettes. She has a 20 pack-year smoking history. She has never used smokeless tobacco. She reports that she does not drink alcohol or use illicit drugs.   Family History:  The patient's family history includes Deep vein thrombosis in her mother; Depression in her brother; Diabetes in her mother and sister; Diabetes type II in her mother; Hemochromatosis in her sister and sister; Hypertension in her brother, father, mother, and sister; Other in her brother.   ROS:  Please see the history of present illness.   She recently fell. She tripped on a rug.   All other systems reviewed and negative.   PHYSICAL EXAM: VS:  BP 140/77  Pulse 79  Ht 5\' 1"  (1.549 m)  Wt 173 lb (78.472 kg)  BMI 32.70 kg/m2 Well nourished, well developed, in no acute distress HEENT: normal Neck: no JVD Cardiac:  normal S1, S2; RRR; no murmur Lungs:  clear to  auscultation bilaterally, no wheezing, rhonchi or rales Abd: soft, nontender, no hepatomegaly Ext: trace bilateral LE edema Skin: warm and dry Neuro:  CNs 2-12 intact, no focal abnormalities noted  EKG:  NSR, HR 79, normal axis, nonspecific ST-T wave changes, QTc 470  ASSESSMENT AND PLAN:  1. Chest pain, unspecified:  Etiology not entirely clear. I suspect overall this was related to Spiriva. However, she does have a history of nonobstructive CAD by cardiac catheterization over 10 years ago. He also has a history of significant vascular disease and is status post bilateral CEA. I will arrange a Lexiscan Myoview to rule out ischemia. 2. Persistent atrial fibrillation:  She is maintaining sinus rhythm on low-dose amiodarone. She is not on anticoagulation given fall risk. We will obtain followup TSH and LFTs today. 3. Shortness of breath:  I suspect this is related to COPD more than anything else. She does not look particularly volume overloaded on exam. She has had difficulty with combined systolic and diastolic CHF in the past. I will check a basic metabolic panel and BNP today. Adjust Lasix as necessary. Proceed with Myoview as noted. Obtain followup echocardiogram given prior history of aortic stenosis. 4. Chronic combined systolic and diastolic heart failure:  Obtain basic metabolic panel and BNP is noted. 5. Coronary Artery Disease - Plan: Myocardial Perfusion Imaging.  Continue aspirin, statin. 6. Mild Aortic Stenosis - Plan: 2D Echocardiogram without contrast 7. COPD 8. Essential hypertension:  Controlled. 9. Carotid stenosis: Followup with vascular surgery as planned. 10. Disposition: Followup with Dr. Lauree Chandler in 6 mos (or sooner if myoview abnormal).      Signed, Versie Starks, MHS 09/12/2013 3:59 PM    [facility] [address], [place], [place]  [address] Phone: [phone]; Fax: [phone]

## 2013-09-13 ENCOUNTER — Telehealth: Payer: Self-pay | Admitting: *Deleted

## 2013-09-13 LAB — BASIC METABOLIC PANEL
BUN: 19 mg/dL (ref 6–23)
CO2: 33 meq/L — AB (ref 19–32)
Calcium: 9.2 mg/dL (ref 8.4–10.5)
Chloride: 104 mEq/L (ref 96–112)
Creatinine, Ser: 1.3 mg/dL — ABNORMAL HIGH (ref 0.4–1.2)
GFR: 40.38 mL/min — ABNORMAL LOW (ref >60.00)
GLUCOSE: 114 mg/dL — AB (ref 70–99)
Potassium: 5 mEq/L (ref 3.5–5.1)
Sodium: 141 mEq/L (ref 135–145)

## 2013-09-13 LAB — HEPATIC FUNCTION PANEL
ALBUMIN: 3.6 g/dL (ref 3.5–5.2)
ALT: 15 U/L (ref 0–35)
AST: 22 U/L (ref 0–37)
Alkaline Phosphatase: 85 U/L (ref 39–117)
BILIRUBIN DIRECT: 0.1 mg/dL (ref 0.0–0.3)
TOTAL PROTEIN: 6.9 g/dL (ref 6.0–8.3)
Total Bilirubin: 0.6 mg/dL (ref 0.2–1.2)

## 2013-09-13 LAB — TSH: TSH: 0.65 u[IU]/mL (ref 0.35–4.50)

## 2013-09-13 LAB — BRAIN NATRIURETIC PEPTIDE: Pro B Natriuretic peptide (BNP): 84 pg/mL (ref 0.0–100.0)

## 2013-09-13 NOTE — Telephone Encounter (Signed)
s/w pt's daughter [name] about lab results with verbal understanding

## 2013-09-26 ENCOUNTER — Ambulatory Visit (HOSPITAL_BASED_OUTPATIENT_CLINIC_OR_DEPARTMENT_OTHER): Payer: Medicare Other | Admitting: Radiology

## 2013-09-26 ENCOUNTER — Ambulatory Visit (HOSPITAL_COMMUNITY): Payer: Medicare Other | Attending: Cardiology | Admitting: Radiology

## 2013-09-26 ENCOUNTER — Encounter: Payer: Self-pay | Admitting: Physician Assistant

## 2013-09-26 VITALS — BP 126/88 | HR 78 | Ht 61.0 in | Wt 172.0 lb

## 2013-09-26 DIAGNOSIS — I2510 Atherosclerotic heart disease of native coronary artery without angina pectoris: Secondary | ICD-10-CM | POA: Insufficient documentation

## 2013-09-26 DIAGNOSIS — I4891 Unspecified atrial fibrillation: Secondary | ICD-10-CM

## 2013-09-26 DIAGNOSIS — R079 Chest pain, unspecified: Secondary | ICD-10-CM

## 2013-09-26 DIAGNOSIS — I059 Rheumatic mitral valve disease, unspecified: Secondary | ICD-10-CM | POA: Insufficient documentation

## 2013-09-26 DIAGNOSIS — I379 Nonrheumatic pulmonary valve disorder, unspecified: Secondary | ICD-10-CM | POA: Diagnosis not present

## 2013-09-26 DIAGNOSIS — I517 Cardiomegaly: Secondary | ICD-10-CM | POA: Diagnosis not present

## 2013-09-26 DIAGNOSIS — E785 Hyperlipidemia, unspecified: Secondary | ICD-10-CM | POA: Insufficient documentation

## 2013-09-26 DIAGNOSIS — I679 Cerebrovascular disease, unspecified: Secondary | ICD-10-CM | POA: Insufficient documentation

## 2013-09-26 DIAGNOSIS — I10 Essential (primary) hypertension: Secondary | ICD-10-CM | POA: Diagnosis not present

## 2013-09-26 DIAGNOSIS — I5042 Chronic combined systolic (congestive) and diastolic (congestive) heart failure: Secondary | ICD-10-CM

## 2013-09-26 DIAGNOSIS — R0609 Other forms of dyspnea: Secondary | ICD-10-CM | POA: Insufficient documentation

## 2013-09-26 DIAGNOSIS — E669 Obesity, unspecified: Secondary | ICD-10-CM | POA: Diagnosis not present

## 2013-09-26 DIAGNOSIS — I359 Nonrheumatic aortic valve disorder, unspecified: Secondary | ICD-10-CM | POA: Diagnosis not present

## 2013-09-26 DIAGNOSIS — J449 Chronic obstructive pulmonary disease, unspecified: Secondary | ICD-10-CM | POA: Diagnosis not present

## 2013-09-26 DIAGNOSIS — I079 Rheumatic tricuspid valve disease, unspecified: Secondary | ICD-10-CM | POA: Diagnosis not present

## 2013-09-26 DIAGNOSIS — I509 Heart failure, unspecified: Secondary | ICD-10-CM | POA: Insufficient documentation

## 2013-09-26 DIAGNOSIS — I4819 Other persistent atrial fibrillation: Secondary | ICD-10-CM

## 2013-09-26 DIAGNOSIS — R0989 Other specified symptoms and signs involving the circulatory and respiratory systems: Secondary | ICD-10-CM | POA: Insufficient documentation

## 2013-09-26 DIAGNOSIS — J4489 Other specified chronic obstructive pulmonary disease: Secondary | ICD-10-CM | POA: Insufficient documentation

## 2013-09-26 DIAGNOSIS — I350 Nonrheumatic aortic (valve) stenosis: Secondary | ICD-10-CM

## 2013-09-26 DIAGNOSIS — R0602 Shortness of breath: Secondary | ICD-10-CM

## 2013-09-26 MED ORDER — TECHNETIUM TC 99M SESTAMIBI GENERIC - CARDIOLITE
11.0000 | Freq: Once | INTRAVENOUS | Status: AC | PRN
  Administered 2013-09-26: 11 via INTRAVENOUS

## 2013-09-26 MED ORDER — TECHNETIUM TC 99M SESTAMIBI GENERIC - CARDIOLITE
33.0000 | Freq: Once | INTRAVENOUS | Status: AC | PRN
  Administered 2013-09-26: 33 via INTRAVENOUS

## 2013-09-26 MED ORDER — REGADENOSON 0.4 MG/5ML IV SOLN
0.4000 mg | Freq: Once | INTRAVENOUS | Status: AC
  Administered 2013-09-26: 0.4 mg via INTRAVENOUS

## 2013-09-26 NOTE — Progress Notes (Signed)
Echocardiogram performed.  

## 2013-09-26 NOTE — Progress Notes (Signed)
[facility] [address] [place], [place] [address] [phone]    Cardiology Nuclear Med Study  [name] [name] is a 80 y.o. female     MRN : [id number]     DOB: February 08, 1924  Procedure Date: 09/26/2013  Nuclear Med Background Indication for Stress Test:  Evaluation for Ischemia History:  CAD, Cath (n/o dz.), Afib, Echo 2014 (mild AS) EF 45-50%, COPD Cardiac Risk Factors: Carotid Disease, History of Smoking, Hypertension and Lipids  Symptoms:  Chest Pain, DOE and SOB   Nuclear Pre-Procedure Caffeine/Decaff Intake:  None NPO After: 6 pm   Lungs:  clear O2 Sat: 97% on room air. IV 0.9% NS with Angio Cath:  22g  IV Site: R Antecubital  IV Started by:  Crissie Figures, RN  Chest Size (in):  38 Cup Size: C  Height: 5\' 1"  (1.549 m)  Weight:  172 lb (78.019 kg)  BMI:  Body mass index is 32.52 kg/(m^2). Tech Comments:  N/A    Nuclear Med Study 1 or 2 day study: 1 day  Stress Test Type:  Lexiscan  Reading MD: N/A  Order Authorizing Provider:  Lauree Chandler, MD  Resting Radionuclide: Technetium 82m Sestamibi  Resting Radionuclide Dose: 11.0 mCi   Stress Radionuclide:  Technetium 33m Sestamibi  Stress Radionuclide Dose: 33.0 mCi           Stress Protocol Rest HR: 78 Stress HR: 83  Rest BP: 126/88 Stress BP: 135/67  Exercise Time (min): n/a METS: n/a           Dose of Adenosine (mg):  n/a Dose of Lexiscan: 0.4 mg  Dose of Atropine (mg): n/a Dose of Dobutamine: n/a mcg/kg/min (at max HR)  Stress Test Technologist: Glade Lloyd, BS-ES  Nuclear Technologist:  Vedia Pereyra, CNMT     Rest Procedure:  Myocardial perfusion imaging was performed at rest 45 minutes following the intravenous administration of Technetium 1m Sestamibi. Rest ECG: NSR - Normal EKG  Stress Procedure:  The patient received IV Lexiscan 0.4 mg over 15-seconds.  Technetium 37m Sestamibi injected at 30-seconds.  Quantitative spect images were obtained after a 45 minute  delay.  During the infusion of Lexiscan the patient complained of SOB and neck feeling tight.  These symptoms began to ease in recovery.  Stress ECG: No significant change from baseline ECG  QPS Raw Data Images:  Normal; no motion artifact; normal heart/lung ratio. Stress Images:  Normal homogeneous uptake in all areas of the myocardium. Rest Images:  Normal homogeneous uptake in all areas of the myocardium. Subtraction (SDS):  No evidence of ischemia. Transient Ischemic Dilatation (Normal <1.22):  1.02 Lung/Heart Ratio (Normal <0.45):  0.27  Quantitative Gated Spect Images QGS EDV:  109 ml QGS ESV:  54 ml  Impression Exercise Capacity:  Lexiscan with no exercise. BP Response:  Normal blood pressure response. Clinical Symptoms:  There is dyspnea. ECG Impression:  No significant ST segment change suggestive of ischemia. Comparison with Prior Nuclear Study: No images to compare  Overall Impression:  Low risk stress nuclear study No ischemia or infarct Mildly decreased EF suggest echo or MRI correlation.  LV Ejection Fraction: 50%.  LV Wall Motion:  No discrete RWMA but calculated EF mildly reduced   Jenkins Rouge

## 2013-09-27 ENCOUNTER — Telehealth: Payer: Self-pay | Admitting: *Deleted

## 2013-09-27 NOTE — Telephone Encounter (Signed)
lmptcb for echo results 

## 2013-09-27 NOTE — Telephone Encounter (Signed)
Follow up:     Pt's daughter called back for her results.   Please give her a call back.

## 2013-09-27 NOTE — Telephone Encounter (Signed)
pt's daughter [name] has been notified echo results with verbal understanding

## 2013-09-29 ENCOUNTER — Encounter: Payer: Self-pay | Admitting: Physician Assistant

## 2013-11-03 ENCOUNTER — Other Ambulatory Visit: Payer: Self-pay | Admitting: Cardiovascular Disease

## 2013-11-12 ENCOUNTER — Ambulatory Visit: Payer: Medicare Other | Admitting: Family

## 2013-11-12 ENCOUNTER — Other Ambulatory Visit (HOSPITAL_COMMUNITY): Payer: Medicare Other

## 2013-11-14 ENCOUNTER — Encounter: Payer: Self-pay | Admitting: Family

## 2013-11-15 ENCOUNTER — Ambulatory Visit (INDEPENDENT_AMBULATORY_CARE_PROVIDER_SITE_OTHER): Payer: Medicare Other | Admitting: Family

## 2013-11-15 ENCOUNTER — Encounter: Payer: Self-pay | Admitting: Family

## 2013-11-15 ENCOUNTER — Ambulatory Visit (HOSPITAL_COMMUNITY)
Admission: RE | Admit: 2013-11-15 | Discharge: 2013-11-15 | Disposition: A | Discharge status: Home / Self Care | Payer: Medicare Other | Source: Ambulatory Visit | Attending: Family | Admitting: Family

## 2013-11-15 VITALS — BP 110/65 | HR 75 | Resp 16 | Ht 61.0 in | Wt 173.0 lb

## 2013-11-15 DIAGNOSIS — Z48812 Encounter for surgical aftercare following surgery on the circulatory system: Secondary | ICD-10-CM | POA: Diagnosis present

## 2013-11-15 DIAGNOSIS — I6529 Occlusion and stenosis of unspecified carotid artery: Secondary | ICD-10-CM

## 2013-11-15 DIAGNOSIS — M7989 Other specified soft tissue disorders: Secondary | ICD-10-CM | POA: Insufficient documentation

## 2013-11-15 NOTE — Progress Notes (Signed)
Established Carotid Patient   History of Present Illness  [name] [name] is a 80 y.o. female who is s/p bilateral CEA in 1998 and a right redo CEA in 2010 by Dr. Trula Slade. She has done well since then. [name] of 2013 she was hospitalized with salmonella enteritis with septic shock as well as needing an ORIF for fractured wrist and C. Diff colitis. She also had atrial fibrillation and was on xarelto. She did have an upper GIB and EGD was negative for source of bleeding but did have extensive esophageal candidiasis and was treated. She has since converted to NSR. She states that she is doing quite well now and also her appetite has returned. She is on a statin for her CAD. She denies any history of recent or remote stroke or TIA. She denies claudication symptoms with walking, but states she does lose her balance sometimes and states she should walk with a cane or her walker. By recorded history she converted to NSR from atrial fib on amiodarone, [name] had to be stopped due to her falling, fractured wrist and hip, and GI bleed.  Pt denies New Medical or Surgical History.  Pt Diabetic: No Pt smoker: former smoker, quit in the 1970's  Pt meds include: Statin : Yes ASA: Yes Other anticoagulants/antiplatelets: no   Past Medical History  Diagnosis Date  . Hypertension   . Hyperlipidemia   . COPD (chronic obstructive pulmonary disease)   . Osteoporosis   . H/O hiatal hernia   . Chronic back pain   . CAD (coronary artery disease)     LHC 9/05:  pLAD less than 20%, D1 20-30%, ostial RCA 40-50%, proximal RCA 20%, EF 60%.  . C. difficile diarrhea   . Gout   . Atrial fibrillation     a. failed DCCV => b. amiodarone added => converted to NSR on Amiodarone;  c. Xarelto d/c'd 2/2 falling (wrist and hip Fx in 1/14)  . Carotid stenosis     s/p bilat CEA  . C. difficile colitis     10/2011;  11/2011  . Salmonella enteritis 08/2011    c/b septic shock  . Hx of echocardiogram     a. Echocardiogram  09/29/11: Mild LVH, EF 45-50%, diffuse HK, mild to moderate aortic stenosis, mean gradient 12 mmHg, moderate MAC, mild MR, moderate LAE, moderate RAE, question small secundum ASD with left to right flow not seen in 4 chamber view, PASP 35-39 (mild pulmonary hypertension). ;  b.  TEE on 10/04/11: Mild LVH, EF 55%, mild MR, no defect or PFO  . Pneumonia 11/2101  . PONV (postoperative nausea and vomiting)   . CHF (congestive heart failure)   . Chronic kidney disease   . GERD (gastroesophageal reflux disease)   . Hepatitis   . Anemia   . Hx of echocardiogram     Echo (09/2013): EF 55%, normal wall motion, mild aortic stenosis (mean 12 mm Hg), MAC, mild MR, mild LAE, mild PI, PASP 30 mm Hg  . Hx of cardiovascular stress test     Lexiscan Myoview (7/15):  No ischemia or infarct, EF 50%, Low Risk  . Arthritis     Neck    Social History History  Substance Use Topics  . Smoking status: Former Smoker -- 1.00 packs/day for 20 years    Types: Cigarettes    Quit date: 09/01/1983  . Smokeless tobacco: Never Used  . Alcohol Use: No    Family History Family History  Problem Relation Age of  Onset  . Hemochromatosis Sister   . Diabetes Sister   . Hypertension Sister   . Diabetes type II Mother   . Diabetes Mother   . Hypertension Mother   . Deep vein thrombosis Mother   . Hypertension Father   . Hypertension Brother   . Other Brother     Leg amputation-[place]  . Depression Brother   . Hemochromatosis Sister     Surgical History Past Surgical History  Procedure Laterality Date  . Carotid endarterectomy      bilateral  . Cataract extraction, bilateral    . Joint replacement  2008    Right Total Knee  . Anal fistula repair    . Left parotidectomy    . Eye surgery    . Flexible sigmoidoscopy  09/02/2011    Procedure: FLEXIBLE SIGMOIDOSCOPY;  Surgeon: Beryle Beams, MD;  Location: [facility];  Service: Endoscopy;  Laterality: N/A;  . Flexible sigmoidoscopy  09/03/2011    Procedure:  FLEXIBLE SIGMOIDOSCOPY;  Surgeon: Beryle Beams, MD;  Location: [facility];  Service: Endoscopy;  Laterality: N/A;  . Tee without cardioversion  10/04/2011    Procedure: TRANSESOPHAGEAL ECHOCARDIOGRAM (TEE);  Surgeon: Larey Dresser, MD;  Location: [place];  Service: Cardiovascular;  Laterality: N/A;  to be carelinked here by 1230-verified 7/29/dl  . Cardioversion  10/04/2011    Procedure: CARDIOVERSION;  Surgeon: Larey Dresser, MD;  Location: [facility];  Service: Cardiovascular;  Laterality: N/A;  . Esophagogastroduodenoscopy  11/29/2011    Procedure: ESOPHAGOGASTRODUODENOSCOPY (EGD);  Surgeon: Cleotis Nipper, MD;  Location: [facility];  Service: Endoscopy;  Laterality: N/A;  recent h/o resp failure/pneumonia  . Left hip fracture repair  03/14/12  . Open reduction internal fixation (orif) scaphoid with distal radius graft  03/14/2012    Procedure: OPEN REDUCTION INTERNAL FIXATION (ORIF) SCAPHOID WITH DISTAL RADIUS GRAFT;  Surgeon: Jolyn Nap, MD;  Location: [facility];  Service: Orthopedics;  Laterality: Left;  DVR wrist fracture set/ hand innovation  . Hip pinning,cannulated  03/14/2012    Procedure: CANNULATED HIP PINNING;  Surgeon: Jolyn Nap, MD;  Location: [facility];  Service: Orthopedics;  Laterality: Left;  Biomet 6.5 cannulated screws  . Femur im nail Left 04/13/2012    Procedure: INTRAMEDULLARY (IM) NAIL FEMORAL;  Surgeon: Jolyn Nap, MD;  Location: [place];  Service: Orthopedics;  Laterality: Left;  OPEN REDUCTION INTERNAL FIXATION LEFT PROXIMAL FEMUR Fracture     Allergies  Allergen Reactions  . Baby Powder [Methylbenzethonium] Shortness Of Breath  . Demerol [Meperidine] Hives  . Penicillins Hives  . Spiriva Handihaler [Tiotropium Bromide Monohydrate]     Caused chest pains    Current Outpatient Prescriptions  Medication Sig Dispense Refill  . allopurinol (ZYLOPRIM) 100 MG tablet Take 100 mg by mouth every morning.       Marland Kitchen amLODipine (NORVASC) 5 MG tablet  Take 5 mg by mouth every morning.      Marland Kitchen aspirin EC 325 MG tablet Take 325 mg by mouth every morning.       Marland Kitchen atorvastatin (LIPITOR) 40 MG tablet Take 40 mg by mouth at bedtime.      . budesonide-formoterol (SYMBICORT) 160-4.5 MCG/ACT inhaler Inhale 2 puffs into the lungs 2 (two) times daily.      . cholecalciferol (VITAMIN D) 1000 UNITS tablet Take 1,000-2,000 Units by mouth 2 (two) times daily. Take 2000 units in the morning and 1000 units in the evening      . ferrous sulfate  325 (65 FE) MG tablet Take 325 mg by mouth 2 (two) times daily with a meal.       . fish oil-omega-3 fatty acids 1000 MG capsule Take 1 g by mouth 2 (two) times daily.       . Flaxseed, Linseed, (FLAX SEED OIL PO) Take 1,000 mg by mouth 2 (two) times daily.       . furosemide (LASIX) 20 MG tablet Take 20 mg by mouth every morning.       . levalbuterol (XOPENEX HFA) 45 MCG/ACT inhaler Inhale 2 puffs into the lungs every 4 (four) hours as needed for wheezing.      . meclizine (ANTIVERT) 25 MG tablet Take 1 tablet (25 mg total) by mouth 2 (two) times daily as needed for dizziness.  30 tablet  0  . omeprazole (PRILOSEC) 20 MG capsule Take 20 mg by mouth 2 (two) times daily before a meal.       . PACERONE 100 MG tablet TAKE 1 TABLET DAILY  90 tablet  2  . polyethylene glycol (MIRALAX / GLYCOLAX) packet Take 17 g by mouth daily as needed for mild constipation.      . raloxifene (EVISTA) 60 MG tablet Take 60 mg by mouth every morning.        No current facility-administered medications for this visit.    Review of Systems : See HPI for pertinent positives and negatives.  Physical Examination  Filed Vitals:   11/15/13 1523 11/15/13 1525  BP: 110/61 110/65  Pulse: 75 75  Resp:  16  Height:  5\' 1"  (1.549 m)  Weight:  173 lb (78.472 kg)  SpO2:  94%   Body mass index is 32.7 kg/(m^2).  General: WDWN obese female in NAD GAIT: slightly hesitant  Eyes: PERRLA Pulmonary:  Non-labored, CTAB, Negative  Rales, Negative  rhonchi, & Negative wheezing.  Cardiac: regular Rhythm ,  Negative detected murmur.  VASCULAR EXAM Carotid Bruits Right Left   Negative Positive    Aorta is not palpable. Radial pulses are 2+ palpable and equal.                                                                                                                            LE Pulses Right Left       POPLITEAL  not palpable   not palpable       POSTERIOR TIBIAL  not palpable   not palpable        DORSALIS PEDIS      ANTERIOR TIBIAL 1+ palpable  1+ palpable     Gastrointestinal: soft, nontender, BS WNL, no r/g,  negative masses.  Musculoskeletal: Negative muscle atrophy/wasting. M/S 5/5 in upper extremities, 4/5 in lower extremties, Extremities without ischemic changes.  Neurologic: A&O X 3; Appropriate Affect ; SENSATION ;normal;  Speech is normal CN 2-12 intact*, Pain and light touch intact in extremities, Motor exam as listed above.   Non-Invasive Vascular Imaging CAROTID DUPLEX 11/15/2013  CEREBROVASCULAR DUPLEX EVALUATION    INDICATION: Carotid stenosis    PREVIOUS INTERVENTION(S): Right carotid endarterectomy redo performed 02/06/2009; Bilateral carotid endarterectomies 1998    DUPLEX EXAM:     RIGHT  LEFT  Peak Systolic Velocities (cm/s) End Diastolic Velocities (cm/s) Plaque LOCATION Peak Systolic Velocities (cm/s) End Diastolic Velocities (cm/s) Plaque  107 13  CCA PROXIMAL 100 15   60 11  CCA MID 67 8   58 11  CCA DISTAL 110 18 HT  71 0  ECA 144 10 HT  43 12 HT ICA PROXIMAL 134 31 HT  63 17  ICA MID 99 27   78 24  ICA DISTAL 75 20     carotid endarterectomy ICA / CCA Ratio (PSV) carotid endarterectomy  Antegrade Vertebral Flow N/V  962 Brachial Systolic Pressure (mmHg) 836  Triphasic Brachial Artery Waveforms Triphasic    Plaque Morphology:  HM = Homogeneous, HT = Heterogeneous, CP = Calcific Plaque, SP = Smooth Plaque, IP = Irregular Plaque     ADDITIONAL FINDINGS:     IMPRESSION: Right  internal carotid artery is patent with history of carotid endarterectomy, mild hyperplasia present at the distal patch. Left internal carotid artery stenosis present in the less than 40% range with history of carotid endarterectomy.    Compared to the previous exam:  Unchanged since previous study on 11/12/2012.     Assessment: Nadiya Pieratt is a 80 y.o. female who presents with asymptomatic patent right internal carotid artery with history of carotid endarterectomy, mild hyperplasia present at the distal patch. Left internal carotid artery stenosis present in the less than 40% range with history of carotid endarterectomy. Unchanged since previous study on 11/12/2012.   Plan: Follow-up in 1  with Carotid Duplex scan.   I discussed in depth with the patient the nature of atherosclerosis, and emphasized the importance of maximal medical management including strict control of blood pressure, blood glucose, and lipid levels, obtaining regular exercise, and continued cessation of smoking.  The patient is aware that without maximal medical management the underlying atherosclerotic disease process will progress, limiting the benefit of any interventions. The patient was given information about stroke prevention and what symptoms should prompt the patient to seek immediate medical care. Thank you for allowing [place] to participate in this patient's care.  Clemon Chambers, RN, MSN, FNP-C [facility] Office: [phone]  Clinic Physician: Bridgett Larsson on call  11/15/2013 3:45 PM

## 2013-11-15 NOTE — Patient Instructions (Signed)
Stroke Prevention Some medical conditions and behaviors are associated with an increased chance of having a stroke. You may prevent a stroke by making healthy choices and managing medical conditions. HOW CAN I REDUCE MY RISK OF HAVING A STROKE?   Stay physically active. Get at least 30 minutes of activity on most or all days.  Do not smoke. It may also be helpful to avoid exposure to secondhand smoke.  Limit alcohol use. Moderate alcohol use is considered to be:  No more than 2 drinks per day for men.  No more than 1 drink per day for nonpregnant women.  Eat healthy foods. This involves:  Eating 5 or more servings of fruits and vegetables a day.  Making dietary changes that address high blood pressure (hypertension), high cholesterol, diabetes, or obesity.  Manage your cholesterol levels.  Making food choices that are high in fiber and low in saturated fat, trans fat, and cholesterol may control cholesterol levels.  Take any prescribed medicines to control cholesterol as directed by your health care provider.  Manage your diabetes.  Controlling your carbohydrate and sugar intake is recommended to manage diabetes.  Take any prescribed medicines to control diabetes as directed by your health care provider.  Control your hypertension.  Making food choices that are low in salt (sodium), saturated fat, trans fat, and cholesterol is recommended to manage hypertension.  Take any prescribed medicines to control hypertension as directed by your health care provider.  Maintain a healthy weight.  Reducing calorie intake and making food choices that are low in sodium, saturated fat, trans fat, and cholesterol are recommended to manage weight.  Stop drug abuse.  Avoid taking birth control pills.  Talk to your health care provider about the risks of taking birth control pills if you are over 35 years old, smoke, get migraines, or have ever had a blood clot.  Get evaluated for sleep  disorders (sleep apnea).  Talk to your health care provider about getting a sleep evaluation if you snore a lot or have excessive sleepiness.  Take medicines only as directed by your health care provider.  For some people, aspirin or blood thinners (anticoagulants) are helpful in reducing the risk of forming abnormal blood clots that can lead to stroke. If you have the irregular heart rhythm of atrial fibrillation, you should be on a blood thinner unless there is a good reason you cannot take them.  Understand all your medicine instructions.  Make sure that other conditions (such as anemia or atherosclerosis) are addressed. SEEK IMMEDIATE MEDICAL CARE IF:   You have sudden weakness or numbness of the face, arm, or leg, especially on one side of the body.  Your face or eyelid droops to one side.  You have sudden confusion.  You have trouble speaking (aphasia) or understanding.  You have sudden trouble seeing in one or both eyes.  You have sudden trouble walking.  You have dizziness.  You have a loss of balance or coordination.  You have a sudden, severe headache with no known cause.  You have new chest pain or an irregular heartbeat. Any of these symptoms may represent a serious problem that is an emergency. Do not wait to see if the symptoms will go away. Get medical help at once. Call your local emergency services (911 in [place] Do not drive yourself to the hospital. Document Released: 03/31/2004 Document Revised: 07/08/2013 Document Reviewed: 08/24/2012 [employer] Patient Information 2015 [employer]. This information is not intended to replace advice given   to you by your health care provider. Make sure you discuss any questions you have with your health care provider.  

## 2014-02-26 ENCOUNTER — Inpatient Hospital Stay (HOSPITAL_COMMUNITY)
Admission: EM | Admit: 2014-02-26 | Discharge: 2014-02-28 | DRG: 292 | Disposition: A | Discharge status: Home / Self Care | Payer: Medicare Other | Attending: Internal Medicine | Admitting: Internal Medicine

## 2014-02-26 ENCOUNTER — Emergency Department (HOSPITAL_COMMUNITY): Payer: Medicare Other

## 2014-02-26 ENCOUNTER — Encounter (HOSPITAL_COMMUNITY): Payer: Self-pay

## 2014-02-26 DIAGNOSIS — Z8249 Family history of ischemic heart disease and other diseases of the circulatory system: Secondary | ICD-10-CM | POA: Diagnosis not present

## 2014-02-26 DIAGNOSIS — Z79899 Other long term (current) drug therapy: Secondary | ICD-10-CM

## 2014-02-26 DIAGNOSIS — I129 Hypertensive chronic kidney disease with stage 1 through stage 4 chronic kidney disease, or unspecified chronic kidney disease: Secondary | ICD-10-CM | POA: Diagnosis present

## 2014-02-26 DIAGNOSIS — E785 Hyperlipidemia, unspecified: Secondary | ICD-10-CM | POA: Diagnosis present

## 2014-02-26 DIAGNOSIS — Z91048 Other nonmedicinal substance allergy status: Secondary | ICD-10-CM | POA: Diagnosis not present

## 2014-02-26 DIAGNOSIS — Z885 Allergy status to narcotic agent status: Secondary | ICD-10-CM

## 2014-02-26 DIAGNOSIS — Z7982 Long term (current) use of aspirin: Secondary | ICD-10-CM

## 2014-02-26 DIAGNOSIS — R0602 Shortness of breath: Secondary | ICD-10-CM | POA: Diagnosis present

## 2014-02-26 DIAGNOSIS — I482 Chronic atrial fibrillation: Secondary | ICD-10-CM | POA: Diagnosis present

## 2014-02-26 DIAGNOSIS — Z96651 Presence of right artificial knee joint: Secondary | ICD-10-CM | POA: Diagnosis present

## 2014-02-26 DIAGNOSIS — M810 Age-related osteoporosis without current pathological fracture: Secondary | ICD-10-CM | POA: Diagnosis present

## 2014-02-26 DIAGNOSIS — N183 Chronic kidney disease, stage 3 (moderate): Secondary | ICD-10-CM | POA: Diagnosis present

## 2014-02-26 DIAGNOSIS — I959 Hypotension, unspecified: Secondary | ICD-10-CM | POA: Diagnosis present

## 2014-02-26 DIAGNOSIS — K219 Gastro-esophageal reflux disease without esophagitis: Secondary | ICD-10-CM | POA: Diagnosis present

## 2014-02-26 DIAGNOSIS — N179 Acute kidney failure, unspecified: Secondary | ICD-10-CM | POA: Diagnosis present

## 2014-02-26 DIAGNOSIS — D649 Anemia, unspecified: Secondary | ICD-10-CM | POA: Diagnosis present

## 2014-02-26 DIAGNOSIS — I472 Ventricular tachycardia: Secondary | ICD-10-CM | POA: Diagnosis present

## 2014-02-26 DIAGNOSIS — G8929 Other chronic pain: Secondary | ICD-10-CM | POA: Diagnosis present

## 2014-02-26 DIAGNOSIS — I5033 Acute on chronic diastolic (congestive) heart failure: Secondary | ICD-10-CM | POA: Diagnosis present

## 2014-02-26 DIAGNOSIS — I10 Essential (primary) hypertension: Secondary | ICD-10-CM | POA: Diagnosis present

## 2014-02-26 DIAGNOSIS — I480 Paroxysmal atrial fibrillation: Secondary | ICD-10-CM | POA: Diagnosis present

## 2014-02-26 DIAGNOSIS — M549 Dorsalgia, unspecified: Secondary | ICD-10-CM | POA: Diagnosis present

## 2014-02-26 DIAGNOSIS — I2510 Atherosclerotic heart disease of native coronary artery without angina pectoris: Secondary | ICD-10-CM | POA: Diagnosis present

## 2014-02-26 DIAGNOSIS — Z888 Allergy status to other drugs, medicaments and biological substances status: Secondary | ICD-10-CM

## 2014-02-26 DIAGNOSIS — I6523 Occlusion and stenosis of bilateral carotid arteries: Secondary | ICD-10-CM | POA: Diagnosis present

## 2014-02-26 DIAGNOSIS — Z91010 Allergy to peanuts: Secondary | ICD-10-CM

## 2014-02-26 DIAGNOSIS — J449 Chronic obstructive pulmonary disease, unspecified: Secondary | ICD-10-CM | POA: Diagnosis present

## 2014-02-26 DIAGNOSIS — Z9889 Other specified postprocedural states: Secondary | ICD-10-CM | POA: Diagnosis not present

## 2014-02-26 DIAGNOSIS — M109 Gout, unspecified: Secondary | ICD-10-CM | POA: Diagnosis present

## 2014-02-26 DIAGNOSIS — R0600 Dyspnea, unspecified: Secondary | ICD-10-CM | POA: Diagnosis present

## 2014-02-26 DIAGNOSIS — N189 Chronic kidney disease, unspecified: Secondary | ICD-10-CM

## 2014-02-26 DIAGNOSIS — M1388 Other specified arthritis, other site: Secondary | ICD-10-CM | POA: Diagnosis present

## 2014-02-26 LAB — COMPREHENSIVE METABOLIC PANEL
ALT: 15 U/L (ref 0–35)
AST: 21 U/L (ref 0–37)
Albumin: 3.6 g/dL (ref 3.5–5.2)
Alkaline Phosphatase: 74 U/L (ref 39–117)
Anion gap: 8 (ref 5–15)
BUN: 21 mg/dL (ref 6–23)
CALCIUM: 9.1 mg/dL (ref 8.4–10.5)
CO2: 27 mmol/L (ref 19–32)
Chloride: 106 mEq/L (ref 96–112)
Creatinine, Ser: 1.43 mg/dL — ABNORMAL HIGH (ref 0.50–1.10)
GFR calc Af Amer: 37 mL/min — ABNORMAL LOW (ref >90)
GFR calc non Af Amer: 32 mL/min — ABNORMAL LOW (ref >90)
GLUCOSE: 96 mg/dL (ref 70–99)
Potassium: 3.9 mmol/L (ref 3.5–5.1)
SODIUM: 141 mmol/L (ref 135–145)
TOTAL PROTEIN: 6.9 g/dL (ref 6.0–8.3)
Total Bilirubin: 0.7 mg/dL (ref 0.3–1.2)

## 2014-02-26 LAB — TROPONIN I: TROPONIN I: 0.03 ng/mL (ref <0.031)

## 2014-02-26 LAB — CBC WITH DIFFERENTIAL/PLATELET
Basophils Absolute: 0 10*3/uL (ref 0.0–0.1)
Basophils Relative: 1 % (ref 0–1)
Eosinophils Absolute: 0.1 10*3/uL (ref 0.0–0.7)
Eosinophils Relative: 1 % (ref 0–5)
HEMATOCRIT: 35.6 % — AB (ref 36.0–46.0)
Hemoglobin: 11.2 g/dL — ABNORMAL LOW (ref 12.0–15.0)
LYMPHS PCT: 23 % (ref 12–46)
Lymphs Abs: 1.4 10*3/uL (ref 0.7–4.0)
MCH: 30.2 pg (ref 26.0–34.0)
MCHC: 31.5 g/dL (ref 30.0–36.0)
MCV: 96 fL (ref 78.0–100.0)
MONO ABS: 0.3 10*3/uL (ref 0.1–1.0)
Monocytes Relative: 6 % (ref 3–12)
NEUTROS ABS: 4.1 10*3/uL (ref 1.7–7.7)
Neutrophils Relative %: 69 % (ref 43–77)
PLATELETS: 215 10*3/uL (ref 150–400)
RBC: 3.71 MIL/uL — ABNORMAL LOW (ref 3.87–5.11)
RDW: 14.4 % (ref 11.5–15.5)
WBC: 5.9 10*3/uL (ref 4.0–10.5)

## 2014-02-26 LAB — MRSA PCR SCREENING: MRSA BY PCR: NEGATIVE

## 2014-02-26 LAB — BRAIN NATRIURETIC PEPTIDE: B Natriuretic Peptide: 292.3 pg/mL — ABNORMAL HIGH (ref 0.0–100.0)

## 2014-02-26 MED ORDER — ALLOPURINOL 100 MG PO TABS
100.0000 mg | ORAL_TABLET | Freq: Every morning | ORAL | Status: DC
  Administered 2014-02-27 – 2014-02-28 (×2): 100 mg via ORAL
  Filled 2014-02-26 (×2): qty 1

## 2014-02-26 MED ORDER — VITAMIN D3 25 MCG (1000 UNIT) PO TABS
2000.0000 [IU] | ORAL_TABLET | Freq: Every day | ORAL | Status: DC
  Administered 2014-02-27 – 2014-02-28 (×2): 2000 [IU] via ORAL
  Filled 2014-02-26 (×2): qty 2

## 2014-02-26 MED ORDER — MECLIZINE HCL 25 MG PO TABS
25.0000 mg | ORAL_TABLET | Freq: Two times a day (BID) | ORAL | Status: DC | PRN
  Filled 2014-02-26: qty 1

## 2014-02-26 MED ORDER — ATORVASTATIN CALCIUM 40 MG PO TABS
40.0000 mg | ORAL_TABLET | Freq: Every day | ORAL | Status: DC
  Administered 2014-02-26 – 2014-02-27 (×2): 40 mg via ORAL
  Filled 2014-02-26 (×4): qty 1

## 2014-02-26 MED ORDER — PANTOPRAZOLE SODIUM 40 MG PO TBEC
40.0000 mg | DELAYED_RELEASE_TABLET | Freq: Every day | ORAL | Status: DC
  Administered 2014-02-27 – 2014-02-28 (×2): 40 mg via ORAL
  Filled 2014-02-26 (×2): qty 1

## 2014-02-26 MED ORDER — LEVALBUTEROL HCL 0.63 MG/3ML IN NEBU: 0.6300 mg | INHALATION_SOLUTION | Freq: Four times a day (QID) | RESPIRATORY_TRACT | Status: DC | PRN

## 2014-02-26 MED ORDER — AMIODARONE HCL 100 MG PO TABS
100.0000 mg | ORAL_TABLET | Freq: Every day | ORAL | Status: DC
  Administered 2014-02-27: 100 mg via ORAL
  Filled 2014-02-26 (×2): qty 1

## 2014-02-26 MED ORDER — MONTELUKAST SODIUM 10 MG PO TABS
10.0000 mg | ORAL_TABLET | Freq: Every day | ORAL | Status: DC
  Administered 2014-02-26 – 2014-02-27 (×2): 10 mg via ORAL
  Filled 2014-02-26 (×4): qty 1

## 2014-02-26 MED ORDER — ENOXAPARIN SODIUM 30 MG/0.3ML ~~LOC~~ SOLN
30.0000 mg | Freq: Every day | SUBCUTANEOUS | Status: DC
  Administered 2014-02-26 – 2014-02-27 (×2): 30 mg via SUBCUTANEOUS
  Filled 2014-02-26 (×4): qty 0.3

## 2014-02-26 MED ORDER — BUDESONIDE-FORMOTEROL FUMARATE 160-4.5 MCG/ACT IN AERO
2.0000 | INHALATION_SPRAY | Freq: Two times a day (BID) | RESPIRATORY_TRACT | Status: DC
  Administered 2014-02-26 – 2014-02-28 (×3): 2 via RESPIRATORY_TRACT
  Filled 2014-02-26 (×3): qty 6

## 2014-02-26 MED ORDER — FERROUS SULFATE 325 (65 FE) MG PO TABS
325.0000 mg | ORAL_TABLET | Freq: Two times a day (BID) | ORAL | Status: DC
  Administered 2014-02-27 – 2014-02-28 (×3): 325 mg via ORAL
  Filled 2014-02-26 (×5): qty 1

## 2014-02-26 MED ORDER — POLYETHYLENE GLYCOL 3350 17 G PO PACK
17.0000 g | PACK | Freq: Every day | ORAL | Status: DC | PRN
Start: 2014-02-26 — End: 2014-02-28
  Filled 2014-02-26: qty 1

## 2014-02-26 MED ORDER — OMEGA-3-ACID ETHYL ESTERS 1 G PO CAPS
1.0000 g | ORAL_CAPSULE | Freq: Every day | ORAL | Status: DC
  Administered 2014-02-27 – 2014-02-28 (×2): 1 g via ORAL
  Filled 2014-02-26 (×2): qty 1

## 2014-02-26 MED ORDER — ASPIRIN EC 325 MG PO TBEC: 325.0000 mg | DELAYED_RELEASE_TABLET | Freq: Every morning | ORAL | Status: DC

## 2014-02-26 MED ORDER — ASPIRIN EC 325 MG PO TBEC
325.0000 mg | DELAYED_RELEASE_TABLET | Freq: Every day | ORAL | Status: DC
  Administered 2014-02-27 – 2014-02-28 (×2): 325 mg via ORAL
  Filled 2014-02-26 (×2): qty 1

## 2014-02-26 MED ORDER — VITAMIN D3 25 MCG (1000 UNIT) PO TABS
1000.0000 [IU] | ORAL_TABLET | Freq: Every day | ORAL | Status: DC
  Administered 2014-02-26 – 2014-02-27 (×2): 1000 [IU] via ORAL
  Filled 2014-02-26 (×4): qty 1

## 2014-02-26 NOTE — H&P (Signed)
[facility] History and Physical  [name] [name] QAS:[id number] DOB: 07/10/1923 DOA: 02/26/2014  Referring physician: ER physician. PCP: [name] Levering, PA-C   Chief Complaint: Shortness of breath.  HPI: [name] [name] is a 80 y.o. female with history of diastolic CHF, nonobstructive CAD, chronic kidney disease stage II to 3, chronic atrial fibrillation, gout presented to the ER because of shortness of breath. Patient states that she has been feeling short of breath over the last 2 weeks which is mostly exertional. Denies any associated fever chills chest pain. Patient was planning to go to her PCP and was advised to come to the ER. In the ER patient was found to be mildly hypotensive and initially was found to have a heart rate of 1 20 bpm and in A. fib. But patient's heart rate improved slowly to the 100s. Patient's blood pressure remained in the 79G systolic. Chest x-ray shows congestion. Patient denies any productive cough. Patient has been admitted for further management of the shortness of breath. Patient denies any nausea vomiting abdominal pain diarrhea.   Review of Systems: As presented in the history of presenting illness, rest negative.  Past Medical History  Diagnosis Date  . Hypertension   . Hyperlipidemia   . COPD (chronic obstructive pulmonary disease)   . Osteoporosis   . H/O hiatal hernia   . Chronic back pain   . CAD (coronary artery disease)     LHC 9/05:  pLAD less than 20%, D1 20-30%, ostial RCA 40-50%, proximal RCA 20%, EF 60%.  . C. difficile diarrhea   . Gout   . Atrial fibrillation     a. failed DCCV => b. amiodarone added => converted to NSR on Amiodarone;  c. Xarelto d/c'd 2/2 falling (wrist and hip Fx in 1/14)  . Carotid stenosis     s/p bilat CEA  . C. difficile colitis     10/2011;  11/2011  . Salmonella enteritis 08/2011    c/b septic shock  . Hx of echocardiogram     a. Echocardiogram 09/29/11: Mild LVH, EF 45-50%, diffuse HK, mild to moderate  aortic stenosis, mean gradient 12 mmHg, moderate MAC, mild MR, moderate LAE, moderate RAE, question small secundum ASD with left to right flow not seen in 4 chamber view, PASP 35-39 (mild pulmonary hypertension). ;  b.  TEE on 10/04/11: Mild LVH, EF 55%, mild MR, no defect or PFO  . Pneumonia 11/2101  . PONV (postoperative nausea and vomiting)   . CHF (congestive heart failure)   . Chronic kidney disease   . GERD (gastroesophageal reflux disease)   . Hepatitis   . Anemia   . Hx of echocardiogram     Echo (09/2013): EF 55%, normal wall motion, mild aortic stenosis (mean 12 mm Hg), MAC, mild MR, mild LAE, mild PI, PASP 30 mm Hg  . Hx of cardiovascular stress test     Lexiscan Myoview (7/15):  No ischemia or infarct, EF 50%, Low Risk  . Arthritis     Neck   Past Surgical History  Procedure Laterality Date  . Carotid endarterectomy      bilateral  . Cataract extraction, bilateral    . Joint replacement  2008    Right Total Knee  . Anal fistula repair    . Left parotidectomy    . Eye surgery    . Flexible sigmoidoscopy  09/02/2011    Procedure: FLEXIBLE SIGMOIDOSCOPY;  Surgeon: Beryle Beams, MD;  Location: [facility];  Service: Endoscopy;  Laterality: N/A;  .  Flexible sigmoidoscopy  09/03/2011    Procedure: FLEXIBLE SIGMOIDOSCOPY;  Surgeon: Beryle Beams, MD;  Location: [facility];  Service: Endoscopy;  Laterality: N/A;  . Tee without cardioversion  10/04/2011    Procedure: TRANSESOPHAGEAL ECHOCARDIOGRAM (TEE);  Surgeon: Larey Dresser, MD;  Location: [place];  Service: Cardiovascular;  Laterality: N/A;  to be carelinked here by 1230-verified 7/29/dl  . Cardioversion  10/04/2011    Procedure: CARDIOVERSION;  Surgeon: Larey Dresser, MD;  Location: [facility];  Service: Cardiovascular;  Laterality: N/A;  . Esophagogastroduodenoscopy  11/29/2011    Procedure: ESOPHAGOGASTRODUODENOSCOPY (EGD);  Surgeon: Cleotis Nipper, MD;  Location: [facility];  Service: Endoscopy;  Laterality:  N/A;  recent h/o resp failure/pneumonia  . Left hip fracture repair  03/14/12  . Open reduction internal fixation (orif) scaphoid with distal radius graft  03/14/2012    Procedure: OPEN REDUCTION INTERNAL FIXATION (ORIF) SCAPHOID WITH DISTAL RADIUS GRAFT;  Surgeon: Jolyn Nap, MD;  Location: [facility];  Service: Orthopedics;  Laterality: Left;  DVR wrist fracture set/ hand innovation  . Hip pinning,cannulated  03/14/2012    Procedure: CANNULATED HIP PINNING;  Surgeon: Jolyn Nap, MD;  Location: [facility];  Service: Orthopedics;  Laterality: Left;  Biomet 6.5 cannulated screws  . Femur im nail Left 04/13/2012    Procedure: INTRAMEDULLARY (IM) NAIL FEMORAL;  Surgeon: Jolyn Nap, MD;  Location: [place];  Service: Orthopedics;  Laterality: Left;  OPEN REDUCTION INTERNAL FIXATION LEFT PROXIMAL FEMUR Fracture    Social History:  reports that she quit smoking about 30 years ago. Her smoking use included Cigarettes. She has a 20 pack-year smoking history. She has never used smokeless tobacco. She reports that she does not drink alcohol or use illicit drugs. Where does patient live home. Can patient participate in ADLs? Yes.  Allergies  Allergen Reactions  . Baby Powder [Methylbenzethonium] Shortness Of Breath  . Demerol [Meperidine] Hives  . Penicillins Hives  . Spiriva Handihaler [Tiotropium Bromide Monohydrate]     Caused chest pains    Family History:  Family History  Problem Relation Age of Onset  . Hemochromatosis Sister   . Diabetes Sister   . Hypertension Sister   . Diabetes type II Mother   . Diabetes Mother   . Hypertension Mother   . Deep vein thrombosis Mother   . Hypertension Father   . Hypertension Brother   . Other Brother     Leg amputation-Gun shot  . Depression Brother   . Hemochromatosis Sister       Prior to Admission medications   Medication Sig Start Date End Date Taking? Authorizing Provider  allopurinol (ZYLOPRIM) 100 MG tablet Take 100 mg by mouth every  morning.    Yes Historical Provider, MD  amiodarone (PACERONE) 100 MG tablet Take 100 mg by mouth daily.   Yes Historical Provider, MD  amLODipine (NORVASC) 5 MG tablet Take 5 mg by mouth every morning.   Yes Historical Provider, MD  aspirin EC 325 MG tablet Take 325 mg by mouth every morning.    Yes Historical Provider, MD  atorvastatin (LIPITOR) 40 MG tablet Take 40 mg by mouth at bedtime.   Yes Historical Provider, MD  budesonide-formoterol (SYMBICORT) 160-4.5 MCG/ACT inhaler Inhale 2 puffs into the lungs 2 (two) times daily.   Yes Historical Provider, MD  cholecalciferol (VITAMIN D) 1000 UNITS tablet Take 1,000-2,000 Units by mouth 2 (two) times daily. Take 2000 units in the morning and 1000 units in the evening  Yes Historical Provider, MD  ferrous sulfate 325 (65 FE) MG tablet Take 325 mg by mouth 2 (two) times daily with a meal.    Yes Historical Provider, MD  fish oil-omega-3 fatty acids 1000 MG capsule Take 1 g by mouth 2 (two) times daily.    Yes Historical Provider, MD  Flaxseed, Linseed, (FLAX SEED OIL PO) Take 1 capsule by mouth 2 (two) times daily.    Yes Historical Provider, MD  furosemide (LASIX) 20 MG tablet Take 20 mg by mouth every morning.    Yes Historical Provider, MD  levalbuterol [facility] HFA) 45 MCG/ACT inhaler Inhale 2 puffs into the lungs every 6 (six) hours as needed for wheezing.    Yes Historical Provider, MD  montelukast (SINGULAIR) 10 MG tablet Take 10 mg by mouth at bedtime.   Yes Historical Provider, MD  omeprazole (PRILOSEC) 40 MG capsule Take 40 mg by mouth daily.   Yes Historical Provider, MD  polyethylene glycol (MIRALAX / GLYCOLAX) packet Take 17 g by mouth daily as needed for mild constipation.   Yes Historical Provider, MD  raloxifene (EVISTA) 60 MG tablet Take 60 mg by mouth every morning.    Yes Historical Provider, MD  meclizine (ANTIVERT) 25 MG tablet Take 1 tablet (25 mg total) by mouth 2 (two) times daily as needed for dizziness. 07/06/13   Merryl Hacker, MD  omeprazole (PRILOSEC) 20 MG capsule Take 20 mg by mouth 2 (two) times daily before a meal.     Historical Provider, MD  PACERONE 100 MG tablet TAKE 1 TABLET DAILY Patient not taking: Reported on 02/26/2014 11/04/13   Burnell Blanks, MD    Physical Exam: Filed Vitals:   02/26/14 2015 02/26/14 2045 02/26/14 2100 02/26/14 2130  BP: 113/72 103/62 106/63 93/62  Pulse: 51 56  60  Temp:    98.7 F (37.1 C)  TempSrc:    Oral  Resp: 21 19  22   Height:    5\' 1"  (1.549 m)  Weight:    80.287 kg (177 lb)  SpO2: 91% 91%  94%     General:  Well-developed and nourished.  Eyes: Anicteric no pallor.  ENT: No discharge from the ears eyes nose or mouth.  Neck: No mass felt. No JVD appreciated.  Cardiovascular: S1 and S2 heard.  Respiratory: No rhonchi or crepitations.  Abdomen: Soft nontender bowel sounds present.  Skin: No rash.  Musculoskeletal: No edema.  Psychiatric: Appears normal.  Neurologic: Alert awake oriented to time place and person. Moves all extremities.  Labs on Admission:  Basic Metabolic Panel:  Recent Labs Lab 02/26/14 1550  NA 141  K 3.9  CL 106  CO2 27  GLUCOSE 96  BUN 21  CREATININE 1.43*  CALCIUM 9.1   Liver Function Tests:  Recent Labs Lab 02/26/14 1550  AST 21  ALT 15  ALKPHOS 74  BILITOT 0.7  PROT 6.9  ALBUMIN 3.6   No results for input(s): LIPASE, AMYLASE in the last 168 hours. No results for input(s): AMMONIA in the last 168 hours. CBC:  Recent Labs Lab 02/26/14 1550  WBC 5.9  NEUTROABS 4.1  HGB 11.2*  HCT 35.6*  MCV 96.0  PLT 215   Cardiac Enzymes:  Recent Labs Lab 02/26/14 1550  TROPONINI 0.03    BNP (last 3 results)  Recent Labs  09/12/13 1638  PROBNP 84.0   CBG: No results for input(s): GLUCAP in the last 168 hours.  Radiological Exams on Admission: Dg Chest Portable  1 View  02/26/2014   CLINICAL DATA:  Atrial fibrillation. History of COPD, CHF and pneumonia.  EXAM: PORTABLE CHEST -  1 VIEW  COMPARISON:  Chest radiograph Jul 24, 2013  FINDINGS: The cardiac silhouette appears moderately enlarged. Calcified aortic knob. Mild interstitial prominence, increased without pleural effusion or focal consolidation. No pneumothorax.  Surgical clips in the neck may represent thyroidectomy. Thoracolumbar levoscoliosis with moderate degenerative change.  IMPRESSION: Cardiomegaly, interstitial prominence may reflect atypical infection, possibly pulmonary edema without focal consolidation.   Electronically Signed   By: Elon Alas   On: 02/26/2014 19:42    EKG: Independently reviewed. A. fib with RVR.  Assessment/Plan Principal Problem:   SOB (shortness of breath) Active Problems:   Hyperlipidemia   Acute on chronic renal failure   Hypertension   Chronic atrial fibrillation   1. Shortness of breath - causes not exactly clear could be decompensated CHF (EF in July of this year was 50%) from elevated heart rate from A. fib. Patient's blood pressure is low normal and has no room for Lasix at this time. Since patient also has exertional symptoms at this time we will cycle cardiac markers and check d-dimer. Chest x-ray was also suggesting possible infiltrates and patient being hypotensive we will check lactic acid level and procalcitonin levels and based on which if high we will keep patient on antibiotics. For now we will closely observe. Closely follow intake and output and metabolic. 2. Atrial fibrillation - initially rate was elevated but at this time heart rate is around 100. Patient is on amiodarone. I did discuss with on-call cardiologist Dr. Elias Else. We'll closely monitor for any further worsening of 400 and a heart rate goes more than 120 then may need amiodarone infusion. Patient was felt not to be a candidate for anticoagulation secondary to risk of falls and previous GI bleed. Check TSH. 3. Hypotension - cause not clear patient does not look septic. Check lactic acid and  procalcitonin levels. At this time and holding of patient's antihypertensives. 4. Chronic kidney disease stage II to 3 - closely follow intake output and metabolic panel. 5. Nonobstructive CAD cycle cardiac markers. Patient has had a stress test in July of this year which was unremarkable. 6. Hyperlipidemia - continue present medications. 7. Anemia - patient is on iron supplements. Closely follow CBC.    Code Status: Full code.   Family Communication: None.   Disposition Plan: Admit to inpatient.   Jiyah Torpey N. [facility] Pager [phone].  If 7PM-7AM, please contact night-coverage [email or web address] Password [facility] 02/26/2014, 10:47 PM

## 2014-02-26 NOTE — ED Notes (Signed)
Pt reports feeling sob and weak x2 weeks, progressively gotten worse. Went to doctor and rhythm was a fib at rate 89. Pt at this time denies sob, chest pain, dizziness, etc.

## 2014-02-26 NOTE — ED Provider Notes (Signed)
CSN: [id number]     Arrival date & time 02/26/14  1536 History   First MD Initiated Contact with Patient 02/26/14 1558     Chief Complaint  Patient presents with  . Atrial Fibrillation     (Consider location/radiation/quality/duration/timing/severity/associated sxs/prior Treatment) Patient is a 80 y.o. female presenting with weakness.  Weakness This is a new problem. The current episode started 1 to 4 weeks ago. The problem occurs constantly. The problem has been gradually worsening. Associated symptoms include fatigue and weakness (generaliized). Pertinent negatives include no abdominal pain, anorexia, chest pain, congestion, coughing (mild chronic), fever, headaches, nausea, neck pain, numbness, rash, sore throat, urinary symptoms, visual change or vomiting. The symptoms are aggravated by walking. She has tried nothing for the symptoms.    Past Medical History  Diagnosis Date  . Hypertension   . Hyperlipidemia   . COPD (chronic obstructive pulmonary disease)   . Osteoporosis   . H/O hiatal hernia   . Chronic back pain   . CAD (coronary artery disease)     LHC 9/05:  pLAD less than 20%, D1 20-30%, ostial RCA 40-50%, proximal RCA 20%, EF 60%.  . C. difficile diarrhea   . Gout   . Atrial fibrillation     a. failed DCCV => b. amiodarone added => converted to NSR on Amiodarone;  c. Xarelto d/c'd 2/2 falling (wrist and hip Fx in 1/14)  . Carotid stenosis     s/p bilat CEA  . C. difficile colitis     10/2011;  11/2011  . Salmonella enteritis 08/2011    c/b septic shock  . Hx of echocardiogram     a. Echocardiogram 09/29/11: Mild LVH, EF 45-50%, diffuse HK, mild to moderate aortic stenosis, mean gradient 12 mmHg, moderate MAC, mild MR, moderate LAE, moderate RAE, question small secundum ASD with left to right flow not seen in 4 chamber view, PASP 35-39 (mild pulmonary hypertension). ;  b.  TEE on 10/04/11: Mild LVH, EF 55%, mild MR, no defect or PFO  . Pneumonia 11/2101  . PONV  (postoperative nausea and vomiting)   . CHF (congestive heart failure)   . Chronic kidney disease   . GERD (gastroesophageal reflux disease)   . Hepatitis   . Anemia   . Hx of echocardiogram     Echo (09/2013): EF 55%, normal wall motion, mild aortic stenosis (mean 12 mm Hg), MAC, mild MR, mild LAE, mild PI, PASP 30 mm Hg  . Hx of cardiovascular stress test     Lexiscan Myoview (7/15):  No ischemia or infarct, EF 50%, Low Risk  . Arthritis     Neck   Past Surgical History  Procedure Laterality Date  . Carotid endarterectomy      bilateral  . Cataract extraction, bilateral    . Joint replacement  2008    Right Total Knee  . Anal fistula repair    . Left parotidectomy    . Eye surgery    . Flexible sigmoidoscopy  09/02/2011    Procedure: FLEXIBLE SIGMOIDOSCOPY;  Surgeon: Beryle Beams, MD;  Location: [facility];  Service: Endoscopy;  Laterality: N/A;  . Flexible sigmoidoscopy  09/03/2011    Procedure: FLEXIBLE SIGMOIDOSCOPY;  Surgeon: Beryle Beams, MD;  Location: [facility];  Service: Endoscopy;  Laterality: N/A;  . Tee without cardioversion  10/04/2011    Procedure: TRANSESOPHAGEAL ECHOCARDIOGRAM (TEE);  Surgeon: Larey Dresser, MD;  Location: [facility];  Service: Cardiovascular;  Laterality: N/A;  to be carelinked here  by 1230-verified 7/29/dl  . Cardioversion  10/04/2011    Procedure: CARDIOVERSION;  Surgeon: Larey Dresser, MD;  Location: [facility];  Service: Cardiovascular;  Laterality: N/A;  . Esophagogastroduodenoscopy  11/29/2011    Procedure: ESOPHAGOGASTRODUODENOSCOPY (EGD);  Surgeon: Cleotis Nipper, MD;  Location: [facility];  Service: Endoscopy;  Laterality: N/A;  recent h/o resp failure/pneumonia  . Left hip fracture repair  03/14/12  . Open reduction internal fixation (orif) scaphoid with distal radius graft  03/14/2012    Procedure: OPEN REDUCTION INTERNAL FIXATION (ORIF) SCAPHOID WITH DISTAL RADIUS GRAFT;  Surgeon: Jolyn Nap, MD;  Location: [facility];   Service: Orthopedics;  Laterality: Left;  DVR wrist fracture set/ hand innovation  . Hip pinning,cannulated  03/14/2012    Procedure: CANNULATED HIP PINNING;  Surgeon: Jolyn Nap, MD;  Location: [facility];  Service: Orthopedics;  Laterality: Left;  Biomet 6.5 cannulated screws  . Femur im nail Left 04/13/2012    Procedure: INTRAMEDULLARY (IM) NAIL FEMORAL;  Surgeon: Jolyn Nap, MD;  Location: [facility];  Service: Orthopedics;  Laterality: Left;  OPEN REDUCTION INTERNAL FIXATION LEFT PROXIMAL FEMUR Fracture    Family History  Problem Relation Age of Onset  . Hemochromatosis Sister   . Diabetes Sister   . Hypertension Sister   . Diabetes type II Mother   . Diabetes Mother   . Hypertension Mother   . Deep vein thrombosis Mother   . Hypertension Father   . Hypertension Brother   . Other Brother     Leg amputation-Gun shot  . Depression Brother   . Hemochromatosis Sister    History  Substance Use Topics  . Smoking status: Former Smoker -- 1.00 packs/day for 20 years    Types: Cigarettes    Quit date: 09/01/1983  . Smokeless tobacco: Never Used  . Alcohol Use: No   OB History    No data available     Review of Systems  Constitutional: Positive for fatigue. Negative for fever.  HENT: Negative for congestion and sore throat.   Eyes: Negative for visual disturbance.  Respiratory: Positive for shortness of breath (on exertion over 2 weeks). Negative for cough (mild chronic).   Cardiovascular: Positive for leg swelling (over 2 weeks). Negative for chest pain.  Gastrointestinal: Negative for nausea, vomiting, abdominal pain, diarrhea, constipation, blood in stool and anorexia.  Genitourinary: Negative for difficulty urinating.  Musculoskeletal: Negative for back pain and neck pain.  Skin: Negative for rash.  Neurological: Positive for weakness (generaliized). Negative for syncope, numbness and headaches.      Allergies  Baby powder; Demerol; Penicillins; and Spiriva  handihaler  Home Medications   Prior to Admission medications   Medication Sig Start Date End Date Taking? Authorizing Provider  allopurinol (ZYLOPRIM) 100 MG tablet Take 100 mg by mouth every morning.    Yes Historical Provider, MD  amiodarone (PACERONE) 100 MG tablet Take 100 mg by mouth daily.   Yes Historical Provider, MD  amLODipine (NORVASC) 5 MG tablet Take 5 mg by mouth every morning.   Yes Historical Provider, MD  aspirin EC 325 MG tablet Take 325 mg by mouth every morning.    Yes Historical Provider, MD  atorvastatin (LIPITOR) 40 MG tablet Take 40 mg by mouth at bedtime.   Yes Historical Provider, MD  budesonide-formoterol (SYMBICORT) 160-4.5 MCG/ACT inhaler Inhale 2 puffs into the lungs 2 (two) times daily.   Yes Historical Provider, MD  cholecalciferol (VITAMIN D) 1000 UNITS tablet Take 1,000-2,000 Units by mouth  2 (two) times daily. Take 2000 units in the morning and 1000 units in the evening   Yes Historical Provider, MD  ferrous sulfate 325 (65 FE) MG tablet Take 325 mg by mouth 2 (two) times daily with a meal.    Yes Historical Provider, MD  fish oil-omega-3 fatty acids 1000 MG capsule Take 1 g by mouth 2 (two) times daily.    Yes Historical Provider, MD  Flaxseed, Linseed, (FLAX SEED OIL PO) Take 1 capsule by mouth 2 (two) times daily.    Yes Historical Provider, MD  furosemide (LASIX) 20 MG tablet Take 20 mg by mouth every morning.    Yes Historical Provider, MD  levalbuterol [facility] HFA) 45 MCG/ACT inhaler Inhale 2 puffs into the lungs every 6 (six) hours as needed for wheezing.    Yes Historical Provider, MD  montelukast (SINGULAIR) 10 MG tablet Take 10 mg by mouth at bedtime.   Yes Historical Provider, MD  omeprazole (PRILOSEC) 40 MG capsule Take 40 mg by mouth daily.   Yes Historical Provider, MD  polyethylene glycol (MIRALAX / GLYCOLAX) packet Take 17 g by mouth daily as needed for mild constipation.   Yes Historical Provider, MD  raloxifene (EVISTA) 60 MG tablet Take 60  mg by mouth every morning.    Yes Historical Provider, MD  meclizine (ANTIVERT) 25 MG tablet Take 1 tablet (25 mg total) by mouth 2 (two) times daily as needed for dizziness. 07/06/13   Merryl Hacker, MD  omeprazole (PRILOSEC) 20 MG capsule Take 20 mg by mouth 2 (two) times daily before a meal.     Historical Provider, MD  PACERONE 100 MG tablet TAKE 1 TABLET DAILY Patient not taking: Reported on 02/26/2014 11/04/13   Burnell Blanks, MD   BP 118/81 mmHg  Pulse 69  Temp(Src) 98.9 F (37.2 C) (Oral)  Resp 20  SpO2 94% Physical Exam  Constitutional: She is oriented to person, place, and time. She appears well-developed and well-nourished. No distress.  HENT:  Head: Normocephalic and atraumatic.  Eyes: Conjunctivae and EOM are normal.  Neck: Normal range of motion.  Cardiovascular: Normal heart sounds and intact distal pulses.  An irregularly irregular rhythm present. Tachycardia present.  Exam reveals no gallop and no friction rub.   No murmur heard. Pulmonary/Chest: Effort normal and breath sounds normal. No respiratory distress. She has no wheezes. She has no rales.  Abdominal: Soft. She exhibits no distension. There is no tenderness. There is no guarding.  Musculoskeletal: She exhibits no edema or tenderness.  Neurological: She is alert and oriented to person, place, and time.  Skin: Skin is warm and dry. No rash noted. She is not diaphoretic. No erythema.  Nursing note and vitals reviewed.   ED Course  Procedures (including critical care time) Labs Review Labs Reviewed  COMPREHENSIVE METABOLIC PANEL - Abnormal; Notable for the following:    Creatinine, Ser 1.43 (*)    GFR calc non Af Amer 32 (*)    GFR calc Af Amer 37 (*)    All other components within normal limits  BRAIN NATRIURETIC PEPTIDE - Abnormal; Notable for the following:    B Natriuretic Peptide 292.3 (*)    All other components within normal limits  CBC WITH DIFFERENTIAL - Abnormal; Notable for the  following:    RBC 3.71 (*)    Hemoglobin 11.2 (*)    HCT 35.6 (*)    All other components within normal limits  TROPONIN I    Imaging Review  Dg Chest Portable 1 View  02/26/2014   CLINICAL DATA:  Atrial fibrillation. History of COPD, CHF and pneumonia.  EXAM: PORTABLE CHEST - 1 VIEW  COMPARISON:  Chest radiograph Jul 24, 2013  FINDINGS: The cardiac silhouette appears moderately enlarged. Calcified aortic knob. Mild interstitial prominence, increased without pleural effusion or focal consolidation. No pneumothorax.  Surgical clips in the neck may represent thyroidectomy. Thoracolumbar levoscoliosis with moderate degenerative change.  IMPRESSION: Cardiomegaly, interstitial prominence may reflect atypical infection, possibly pulmonary edema without focal consolidation.   Electronically Signed   By: Elon Alas   On: 02/26/2014 19:42     EKG Interpretation   Date/Time:  Wednesday February 26 2014 15:46:09 EST Ventricular Rate:  116 PR Interval:    QRS Duration: 88 QT Interval:  338 QTC Calculation: 469 R Axis:   38 Text Interpretation:  Atrial fibrillation with rapid ventricular response  with premature ventricular or aberrantly conducted complexes Nonspecific  ST and T wave abnormality Abnormal ECG when compared to prior, pt is now  in atrial fibrillation Confirmed by Christy Gentles  MD, Elenore Rota ([id number]) on  02/26/2014 3:48:09 PM      MDM   Final diagnoses:  SOB (shortness of breath)  Dyspnea    80 year old female with a history of hyperlipidemia, hypertension, atrial fibrillation, chronic systolic and diastolic heart failure, CKD presents with concern of generalized weakness, dyspnea on exertion, bilateral leg swelling, and atrial fibrillation with RVR at PCPs office and blood pressure of 90/60.  On arrival to the emergency department patient heart rate is between 100 and 110 and her blood pressure is normal, and IV medications were not initiated.  Pt with mild AKI 1.4, BNP 282,  CXR with mild pulmonary edema.  Given symptoms, leg and pulmonary edema with a fib feel pt most likely with mild CHF exacerbation in setting of atrial fibrillation which is currently not rate controlled.  Pt admitted to hospitalist stepdown given concern of possible decrease in BP.  Alvino Chapel, MD 02/27/14 [place], MD 02/27/14 [phone]

## 2014-02-26 NOTE — ED Notes (Signed)
Pt from home with c/o atrial fibrillation and weakness.  Per family member pt has not been feeling well over the past couple of days and went to see her PCP.  Pt noted to be hypotensive at 90/60 and in atrial fibrillation and was told to come to ER. HR noted to be 110-130 in triage.  EKG given to Dr. Christy Gentles.  Pt sts she felt her heart rate acting up earlier today.  HX of a fib.

## 2014-02-27 ENCOUNTER — Inpatient Hospital Stay (HOSPITAL_COMMUNITY): Payer: Medicare Other

## 2014-02-27 DIAGNOSIS — E785 Hyperlipidemia, unspecified: Secondary | ICD-10-CM

## 2014-02-27 LAB — CBC WITH DIFFERENTIAL/PLATELET
Basophils Absolute: 0 10*3/uL (ref 0.0–0.1)
Basophils Relative: 1 % (ref 0–1)
Eosinophils Absolute: 0.1 10*3/uL (ref 0.0–0.7)
Eosinophils Relative: 2 % (ref 0–5)
HCT: 32.3 % — ABNORMAL LOW (ref 36.0–46.0)
HEMOGLOBIN: 9.9 g/dL — AB (ref 12.0–15.0)
LYMPHS ABS: 1.3 10*3/uL (ref 0.7–4.0)
LYMPHS PCT: 29 % (ref 12–46)
MCH: 29.7 pg (ref 26.0–34.0)
MCHC: 30.7 g/dL (ref 30.0–36.0)
MCV: 97 fL (ref 78.0–100.0)
MONO ABS: 0.4 10*3/uL (ref 0.1–1.0)
MONOS PCT: 8 % (ref 3–12)
NEUTROS ABS: 2.8 10*3/uL (ref 1.7–7.7)
Neutrophils Relative %: 60 % (ref 43–77)
Platelets: 189 10*3/uL (ref 150–400)
RBC: 3.33 MIL/uL — AB (ref 3.87–5.11)
RDW: 14.4 % (ref 11.5–15.5)
WBC: 4.7 10*3/uL (ref 4.0–10.5)

## 2014-02-27 LAB — COMPREHENSIVE METABOLIC PANEL
ALT: 12 U/L (ref 0–35)
ANION GAP: 6 (ref 5–15)
AST: 17 U/L (ref 0–37)
Albumin: 3 g/dL — ABNORMAL LOW (ref 3.5–5.2)
Alkaline Phosphatase: 63 U/L (ref 39–117)
BUN: 19 mg/dL (ref 6–23)
CO2: 29 mmol/L (ref 19–32)
Calcium: 8.8 mg/dL (ref 8.4–10.5)
Chloride: 107 mEq/L (ref 96–112)
Creatinine, Ser: 1.38 mg/dL — ABNORMAL HIGH (ref 0.50–1.10)
GFR calc Af Amer: 38 mL/min — ABNORMAL LOW (ref >90)
GFR calc non Af Amer: 33 mL/min — ABNORMAL LOW (ref >90)
Glucose, Bld: 97 mg/dL (ref 70–99)
Potassium: 4.3 mmol/L (ref 3.5–5.1)
Sodium: 142 mmol/L (ref 135–145)
TOTAL PROTEIN: 5.6 g/dL — AB (ref 6.0–8.3)
Total Bilirubin: 0.8 mg/dL (ref 0.3–1.2)

## 2014-02-27 LAB — D-DIMER, QUANTITATIVE: D-Dimer, Quant: 1.39 ug/mL-FEU — ABNORMAL HIGH (ref 0.00–0.48)

## 2014-02-27 LAB — MAGNESIUM: MAGNESIUM: 2.2 mg/dL (ref 1.5–2.5)

## 2014-02-27 LAB — TROPONIN I
TROPONIN I: 0.03 ng/mL (ref <0.031)
Troponin I: 0.03 ng/mL (ref <0.031)
Troponin I: <0.03 ng/mL (ref <0.031)

## 2014-02-27 LAB — TSH: TSH: 1.474 u[IU]/mL (ref 0.350–4.500)

## 2014-02-27 LAB — PROCALCITONIN: Procalcitonin: <0.1 ng/mL

## 2014-02-27 LAB — LACTIC ACID, PLASMA: Lactic Acid, Venous: 0.7 mmol/L (ref 0.5–2.2)

## 2014-02-27 MED ORDER — TECHNETIUM TO 99M ALBUMIN AGGREGATED
6.0000 | Freq: Once | INTRAVENOUS | Status: AC | PRN
Start: 2014-02-27 — End: 2014-02-27
  Administered 2014-02-27: 6 via INTRAVENOUS

## 2014-02-27 MED ORDER — TECHNETIUM TC 99M DIETHYLENETRIAME-PENTAACETIC ACID: 40.0000 | Freq: Once | INTRAVENOUS | Status: AC | PRN

## 2014-02-27 MED ORDER — FUROSEMIDE 10 MG/ML IJ SOLN
10.0000 mg | Freq: Two times a day (BID) | INTRAMUSCULAR | Status: DC
Start: 2014-02-27 — End: 2014-02-28
  Administered 2014-02-27 – 2014-02-28 (×2): 10 mg via INTRAVENOUS
  Filled 2014-02-27: qty 1
  Filled 2014-02-27 (×2): qty 2
  Filled 2014-02-27: qty 1

## 2014-02-27 NOTE — Progress Notes (Signed)
Pt has had several episodes of having 2 beats run of VTACH. Pt is asymptomatic and vital signs have remained stable at this time. MD notified.

## 2014-02-27 NOTE — Progress Notes (Signed)
Pt transferred to 5W via wheelchair. VS obtained and assessment complete. Pt oriented to room and call bell placed within reach. Pt stable upon transfer with no further questions at this time.

## 2014-02-27 NOTE — Progress Notes (Signed)
[facility]                                                                              Patient Demographics  [name] [name], is a 80 y.o. female, DOB - 04/30/1923, RJJ:[id number]  Admit date - 02/26/2014   Admitting Physician Rise Patience, MD  Outpatient Primary MD for the patient is [name]  LOS - 1   Chief Complaint  Patient presents with  . Atrial Fibrillation        Assessment & Plan   Dyspnea -Possibly secondary to decompensated CHF versus URI -Improving  -Patient currently afebrile with no leukocytosis -Continue breathing treatments, will add on guaifenesin  Possible acute diastolic CHF exacerbation -BNP mildly elevated, 292 -CXR cardiomegaly, interstitial prominence there but atypical infection possibly pulmonary edema without focal consolidation -Echocardiogram in July 2015 showed EF of 55% -Will restart small dose of Lasix due to hypotension -Will monitor intake output and daily weights  Atrial fibrillation  -Currently rate controlled  -Continue amiodarone  -Patient not on anticoagulation as she was not a candidate due to fall risk as well as previous GI bleed  -TSH 1.475  Hypotension -Resolved, No clear reason -Patient does not appear to be septic -Lactic acid and pro-calcitonin normal -Patient currently normotensive  Chronic kidney disease, stage III -Continue to monitor creatinine  Nonobstructive coronary artery disease -Patient currently chest pain-free -Patient had stress test in July 2015 which is unremarkable -Troponins cycled and negative  Hyperlipidemia -Continue statin  Normocytic Anemia -Continue iron supplementation -Continue to monitor CBC  Code Status: Full  Family Communication: None at bedside  Disposition Plan: Admitted, possible discharge 02/28/2014.  Patient stable, will transfer to medical floor  Time Spent in minutes   30 minutes  Procedures  None  Consults   None  DVT Prophylaxis   Lovenox  Lab Results  Component Value Date   PLT 189 02/27/2014    Medications  Scheduled Meds: . allopurinol  100 mg Oral q morning - 10a  . amiodarone  100 mg Oral Daily  . aspirin EC  325 mg Oral Daily  . atorvastatin  40 mg Oral QHS  . budesonide-formoterol  2 puff Inhalation BID  . cholecalciferol  1,000 Units Oral QHS  . cholecalciferol  2,000 Units Oral Daily  . enoxaparin (LOVENOX) injection  30 mg Subcutaneous QHS  . ferrous sulfate  325 mg Oral BID WC  . montelukast  10 mg Oral QHS  . omega-3 acid ethyl esters  1 g Oral Daily  . pantoprazole  40 mg Oral Daily   Continuous Infusions:  PRN Meds:.levalbuterol, meclizine, polyethylene glycol  Antibiotics    Anti-infectives    None        Subjective:   [name] [name] seen and examined today.  Patient states her shortness of breath has improved. She feels that she is ready to go home. She does continue to have cough. Patient states that she has had cough for a very long time, and is due to her bronchitis.  Objective:   Filed Vitals:   02/27/14 0700 02/27/14 0753 02/27/14 0800 02/27/14 1151  BP: 106/48  112/68 108/66  Pulse: 79  102 90  Temp:  98.1 F (36.7 C)  98.1 F (36.7 C)  TempSrc:  Oral  Oral  Resp: 17  19 15   Height:      Weight:      SpO2: 98%  97% 94%    Wt Readings from Last 3 Encounters:  02/27/14 80.287 kg (177 lb)  11/15/13 78.472 kg (173 lb)  09/26/13 78.019 kg (172 lb)     Intake/Output Summary (Last 24 hours) at 02/27/14 1351 Last data filed at 02/27/14 0345  Gross per 24 hour  Intake      0 ml  Output    800 ml  Net   -800 ml    Exam  General: Well developed, well nourished, NAD, appears stated age  HEENT: NCAT, mucous membranes moist.   Cardiovascular: S1 S2 auscultated, no rubs, murmurs or gallops. Regular rate and rhythm.  Respiratory: Clear to auscultation bilaterally with equal chest rise, no rhonchi, occasional cough  Abdomen: Soft, nontender, nondistended, +  bowel sounds  Extremities: warm dry without cyanosis clubbing. Trace LE edema  Neuro: AAOx3, no focal deficits  Psych: Normal affect and demeanor with intact judgement and insight  Data Review   Micro Results Recent Results (from the past 240 hour(s))  MRSA PCR Screening     Status: None   Collection Time: 02/26/14  9:48 PM  Result Value Ref Range Status   MRSA by PCR NEGATIVE NEGATIVE Final    Comment:        The GeneXpert MRSA Assay (FDA approved for NASAL specimens only), is one component of a comprehensive MRSA colonization surveillance program. It is not intended to diagnose MRSA infection nor to guide or monitor treatment for MRSA infections.     Radiology Reports Dg Chest Portable 1 View  02/26/2014   CLINICAL DATA:  Atrial fibrillation. History of COPD, CHF and pneumonia.  EXAM: PORTABLE CHEST - 1 VIEW  COMPARISON:  Chest radiograph Jul 24, 2013  FINDINGS: The cardiac silhouette appears moderately enlarged. Calcified aortic knob. Mild interstitial prominence, increased without pleural effusion or focal consolidation. No pneumothorax.  Surgical clips in the neck may represent thyroidectomy. Thoracolumbar levoscoliosis with moderate degenerative change.  IMPRESSION: Cardiomegaly, interstitial prominence may reflect atypical infection, possibly pulmonary edema without focal consolidation.   Electronically Signed   By: Elon Alas   On: 02/26/2014 19:42    CBC  Recent Labs Lab 02/26/14 1550 02/27/14 0315  WBC 5.9 4.7  HGB 11.2* 9.9*  HCT 35.6* 32.3*  PLT 215 189  MCV 96.0 97.0  MCH 30.2 29.7  MCHC 31.5 30.7  RDW 14.4 14.4  LYMPHSABS 1.4 1.3  MONOABS 0.3 0.4  EOSABS 0.1 0.1  BASOSABS 0.0 0.0    Chemistries   Recent Labs Lab 02/26/14 1550 02/27/14 0315  NA 141 142  K 3.9 4.3  CL 106 107  CO2 27 29  GLUCOSE 96 97  BUN 21 19  CREATININE 1.43* 1.38*  CALCIUM 9.1 8.8  MG  --  2.2  AST 21 17  ALT 15 12  ALKPHOS 74 63  BILITOT 0.7 0.8    ------------------------------------------------------------------------------------------------------------------ estimated creatinine clearance is 27 mL/min (by C-G formula based on Cr of 1.38). ------------------------------------------------------------------------------------------------------------------ No results for input(s): HGBA1C in the last 72 hours. ------------------------------------------------------------------------------------------------------------------ No results for input(s): CHOL, HDL, LDLCALC, TRIG, CHOLHDL, LDLDIRECT in the last 72 hours. ------------------------------------------------------------------------------------------------------------------  Recent Labs  02/26/14 1332  TSH 1.474   ------------------------------------------------------------------------------------------------------------------ No results for input(s): VITAMINB12, FOLATE, FERRITIN, TIBC, IRON, RETICCTPCT in the last 72 hours.  Coagulation profile No results for input(s): INR, PROTIME in the last 168 hours.   Recent Labs  02/27/14 0315  DDIMER 1.39*    Cardiac Enzymes  Recent Labs Lab 02/26/14 2332 02/27/14 0315 02/27/14 1036  TROPONINI 0.03 0.03 <0.03   ------------------------------------------------------------------------------------------------------------------ Invalid input(s): POCBNP    [name] [name] D.O. on 02/27/2014 at 1:51 PM  Between 7am to 7pm - Pager - [phone]  After 7pm go to [email or web address] - password [email or web address]  And look for the night coverage person covering for me after hours  [facility] Office  [phone]

## 2014-02-28 LAB — BASIC METABOLIC PANEL
Anion gap: 6 (ref 5–15)
BUN: 21 mg/dL (ref 6–23)
CHLORIDE: 108 meq/L (ref 96–112)
CO2: 28 mmol/L (ref 19–32)
CREATININE: 1.48 mg/dL — AB (ref 0.50–1.10)
Calcium: 8.8 mg/dL (ref 8.4–10.5)
GFR calc non Af Amer: 30 mL/min — ABNORMAL LOW (ref >90)
GFR, EST AFRICAN AMERICAN: 35 mL/min — AB (ref >90)
Glucose, Bld: 98 mg/dL (ref 70–99)
Potassium: 4.2 mmol/L (ref 3.5–5.1)
SODIUM: 142 mmol/L (ref 135–145)

## 2014-02-28 LAB — PROCALCITONIN: Procalcitonin: <0.1 ng/mL

## 2014-02-28 NOTE — Discharge Instructions (Signed)
Upper Respiratory Infection, Adult An upper respiratory infection (URI) is also sometimes known as the common cold. The upper respiratory tract includes the nose, sinuses, throat, trachea, and bronchi. Bronchi are the airways leading to the lungs. Most people improve within 1 week, but symptoms can last up to 2 weeks. A residual cough may last even longer.  CAUSES Many different viruses can infect the tissues lining the upper respiratory tract. The tissues become irritated and inflamed and often become very moist. Mucus production is also common. A cold is contagious. You can easily spread the virus to others by oral contact. This includes kissing, sharing a glass, coughing, or sneezing. Touching your mouth or nose and then touching a surface, which is then touched by another person, can also spread the virus. SYMPTOMS  Symptoms typically develop 1 to 3 days after you come in contact with a cold virus. Symptoms vary from person to person. They may include:  Runny nose.  Sneezing.  Nasal congestion.  Sinus irritation.  Sore throat.  Loss of voice (laryngitis).  Cough.  Fatigue.  Muscle aches.  Loss of appetite.  Headache.  Low-grade fever. DIAGNOSIS  You might diagnose your own cold based on familiar symptoms, since most people get a cold 2 to 3 times a year. Your caregiver can confirm this based on your exam. Most importantly, your caregiver can check that your symptoms are not due to another disease such as strep throat, sinusitis, pneumonia, asthma, or epiglottitis. Blood tests, throat tests, and X-rays are not necessary to diagnose a common cold, but they may sometimes be helpful in excluding other more serious diseases. Your caregiver will decide if any further tests are required. RISKS AND COMPLICATIONS  You may be at risk for a more severe case of the common cold if you smoke cigarettes, have chronic heart disease (such as heart failure) or lung disease (such as asthma), or if  you have a weakened immune system. The very young and very old are also at risk for more serious infections. Bacterial sinusitis, middle ear infections, and bacterial pneumonia can complicate the common cold. The common cold can worsen asthma and chronic obstructive pulmonary disease (COPD). Sometimes, these complications can require emergency medical care and may be life-threatening. PREVENTION  The best way to protect against getting a cold is to practice good hygiene. Avoid oral or hand contact with people with cold symptoms. Wash your hands often if contact occurs. There is no clear evidence that vitamin C, vitamin E, echinacea, or exercise reduces the chance of developing a cold. However, it is always recommended to get plenty of rest and practice good nutrition. TREATMENT  Treatment is directed at relieving symptoms. There is no cure. Antibiotics are not effective, because the infection is caused by a virus, not by bacteria. Treatment may include:  Increased fluid intake. Sports drinks offer valuable electrolytes, sugars, and fluids.  Breathing heated mist or steam (vaporizer or shower).  Eating chicken soup or other clear broths, and maintaining good nutrition.  Getting plenty of rest.  Using gargles or lozenges for comfort.  Controlling fevers with ibuprofen or acetaminophen as directed by your caregiver.  Increasing usage of your inhaler if you have asthma. Zinc gel and zinc lozenges, taken in the first 24 hours of the common cold, can shorten the duration and lessen the severity of symptoms. Pain medicines may help with fever, muscle aches, and throat pain. A variety of non-prescription medicines are available to treat congestion and runny nose. Your caregiver   can make recommendations and may suggest nasal or lung inhalers for other symptoms.  HOME CARE INSTRUCTIONS   Only take over-the-counter or prescription medicines for pain, discomfort, or fever as directed by your  caregiver.  Use a warm mist humidifier or inhale steam from a shower to increase air moisture. This may keep secretions moist and make it easier to breathe.  Drink enough water and fluids to keep your urine clear or pale yellow.  Rest as needed.  Return to work when your temperature has returned to normal or as your caregiver advises. You may need to stay home longer to avoid infecting others. You can also use a face mask and careful hand washing to prevent spread of the virus. SEEK MEDICAL CARE IF:   After the first few days, you feel you are getting worse rather than better.  You need your caregiver's advice about medicines to control symptoms.  You develop chills, worsening shortness of breath, or brown or red sputum. These may be signs of pneumonia.  You develop yellow or brown nasal discharge or pain in the face, especially when you bend forward. These may be signs of sinusitis.  You develop a fever, swollen neck glands, pain with swallowing, or white areas in the back of your throat. These may be signs of strep throat. SEEK IMMEDIATE MEDICAL CARE IF:   You have a fever.  You develop severe or persistent headache, ear pain, sinus pain, or chest pain.  You develop wheezing, a prolonged cough, cough up blood, or have a change in your usual mucus (if you have chronic lung disease).  You develop sore muscles or a stiff neck. Document Released: 08/17/2000 Document Revised: 05/16/2011 Document Reviewed: 05/29/2013 Exit[employer] Patient Information 2015 [employer]. This information is not intended to replace advice given to you by your health care provider. Make sure you discuss any questions you have with your health care provider.  

## 2014-02-28 NOTE — Progress Notes (Signed)
MD on call notified concerning runs of v-tach and pulse rate, will continue to monitor

## 2014-02-28 NOTE — Discharge Summary (Signed)
Physician Discharge Summary  [name] [name] ZTI:[id number] DOB: October 25, 1923 DOA: 02/26/2014  PCP: [name]  Admit date: 02/26/2014 Discharge date: 02/28/2014  Time spent: 45 minutes  Recommendations for Outpatient Follow-up:  Patient will be discharged home. She will need follow-up with her primary care physician within one week of discharge. Patient should also follow-up with her cardiologist within 1-2 weeks of discharge. Patient to continue taking her medications as prescribed. Patient may resume a cardiac healthy diet and normal activity tolerated.  Discharge Diagnoses:  Dyspnea Possible acute diastolic CHF exacerbation Atrial fibrillation Hypotension Chronic kidney disease, stage III Nonobstructive coronary artery disease Hyperlipidemia Normocytic anemia  Discharge Condition: Stable  Diet recommendation: Heart healthy  Filed Weights   02/26/14 2130 02/27/14 0350 02/28/14 0235  Weight: 80.287 kg (177 lb) 80.287 kg (177 lb) 81.647 kg (180 lb)    History of present illness:  On 02/26/2014 by Dr. Nelda [name] [name] is a 80 y.o. female with history of diastolic CHF, nonobstructive CAD, chronic kidney disease stage II to 3, chronic atrial fibrillation, gout presented to the ER because of shortness of breath. Patient states that she has been feeling short of breath over the last 2 weeks which is mostly exertional. Denies any associated fever chills chest pain. Patient was planning to go to her PCP and was advised to come to the ER. In the ER patient was found to be mildly hypotensive and initially was found to have a heart rate of 1 20 bpm and in A. fib. But patient's heart rate improved slowly to the 100s. Patient's blood pressure remained in the 82N systolic. Chest x-ray shows congestion. Patient denies any productive cough. Patient has been admitted for further management of the shortness of breath. Patient denies any nausea vomiting abdominal pain diarrhea.    Hospital Course:  Dyspnea -Possibly secondary to decompensated CHF versus URI -Resolved -Patient currently afebrile with no leukocytosis  Possible acute diastolic CHF exacerbation -BNP mildly elevated, 292 -CXR cardiomegaly, interstitial prominence there but atypical infection possibly pulmonary edema without focal consolidation -Echocardiogram in July 2015 showed EF of 55% -Patient may resume her Lasix upon discharge  Atrial fibrillation/NSVT -Currently rate controlled  -Overnight, patient had runs of V. tach however patient remained asymptomatic -Continue amiodarone  -Patient not on anticoagulation as she was not a candidate due to fall risk as well as previous GI bleed  -TSH 1.475 -Magnesium 2.2, potassium 4.2 -Patient instructed to return to the hospital if she becomes short of breath or symptomatic -Patient should follow-up with her primary care physician and cardiologist.  Hypotension -Resolved, No clear reason -Patient does not appear to be septic -Lactic acid and pro-calcitonin normal -Patient currently normotensive  Chronic kidney disease, stage III -Creatinine remained stable  Nonobstructive coronary artery disease -Patient currently chest pain-free -Patient had stress test in July 2015 which is unremarkable -Troponins cycled and negative  Hyperlipidemia -Continue statin  Normocytic Anemia -Continue iron supplementation  Procedures: None  Consultations: None  Discharge Exam: Filed Vitals:   02/28/14 0541  BP: 120/77  Pulse: 109  Temp: 97.7 F (36.5 C)  Resp: 18     General: Well developed, well nourished, NAD, appears stated age  HEENT: NCAT, mucous membranes moist.  Cardiovascular: S1 S2 auscultated  Respiratory: Wheezing noted anterior chest, otherwise clear  Abdomen: Soft, nontender, nondistended, + bowel sounds  Extremities: warm dry without cyanosis clubbing or edema  Neuro: AAOx3, nonfocal  Psych: Appropriate mood and  affet  Discharge Instructions      Discharge  Instructions    Discharge instructions    Complete by:  As directed   Patient will be discharged home. She will need follow-up with her primary care physician within one week of discharge. Patient should also follow-up with her cardiologist within 1-2 weeks of discharge. Patient to continue taking her medications as prescribed. Patient may resume a cardiac healthy diet and normal activity tolerated.            Medication List    TAKE these medications        allopurinol 100 MG tablet  Commonly known as:  ZYLOPRIM  Take 100 mg by mouth every morning.     amiodarone 100 MG tablet  Commonly known as:  PACERONE  Take 100 mg by mouth daily.     amLODipine 5 MG tablet  Commonly known as:  NORVASC  Take 5 mg by mouth every morning.     aspirin EC 325 MG tablet  Take 325 mg by mouth every morning.     atorvastatin 40 MG tablet  Commonly known as:  LIPITOR  Take 40 mg by mouth at bedtime.     budesonide-formoterol 160-4.5 MCG/ACT inhaler  Commonly known as:  SYMBICORT  Inhale 2 puffs into the lungs 2 (two) times daily.     cholecalciferol 1000 UNITS tablet  Commonly known as:  VITAMIN D  Take 1,000-2,000 Units by mouth 2 (two) times daily. Take 2000 units in the morning and 1000 units in the evening     ferrous sulfate 325 (65 FE) MG tablet  Take 325 mg by mouth 2 (two) times daily with a meal.     fish oil-omega-3 fatty acids 1000 MG capsule  Take 1 g by mouth 2 (two) times daily.     FLAX SEED OIL PO  Take 1 capsule by mouth 2 (two) times daily.     furosemide 20 MG tablet  Commonly known as:  LASIX  Take 20 mg by mouth every morning.     levalbuterol 45 MCG/ACT inhaler  Commonly known as:  XOPENEX HFA  Inhale 2 puffs into the lungs every 6 (six) hours as needed for wheezing.     meclizine 25 MG tablet  Commonly known as:  ANTIVERT  Take 1 tablet (25 mg total) by mouth 2 (two) times daily as needed for dizziness.      montelukast 10 MG tablet  Commonly known as:  SINGULAIR  Take 10 mg by mouth at bedtime.     omeprazole 40 MG capsule  Commonly known as:  PRILOSEC  Take 40 mg by mouth daily.     polyethylene glycol packet  Commonly known as:  MIRALAX / GLYCOLAX  Take 17 g by mouth daily as needed for mild constipation.     raloxifene 60 MG tablet  Commonly known as:  EVISTA  Take 60 mg by mouth every morning.       Allergies  Allergen Reactions  . Baby Powder [Methylbenzethonium] Shortness Of Breath  . Demerol [Meperidine] Hives  . Penicillins Hives  . Spiriva Handihaler [Tiotropium Bromide Monohydrate]     Caused chest pains   Follow-up Information    Follow up with WILLARD,JENNIFER, PA-C. Schedule an appointment as soon as possible for a visit in 1 week.   Specialty:  Family Medicine   Why:  Hospital followup   Contact information:   [address] [place] [address] [phone]       Follow up with Lauree Chandler, MD In 1  week.   Specialty:  Cardiology   Why:  Hospital followup, Afib   Contact information:   [address] [place] [address] [phone]        The results of significant diagnostics from this hospitalization (including imaging, microbiology, ancillary and laboratory) are listed below for reference.    Significant Diagnostic Studies: Nm Pulmonary Perf And Vent  02/27/2014   CLINICAL DATA:  Shortness of breath for 2 weeks, history CHF, hypertension, coronary artery disease, COPD  EXAM: NUCLEAR MEDICINE VENTILATION - PERFUSION LUNG SCAN  TECHNIQUE: Ventilation images were obtained in multiple projections using inhaled aerosol technetium 99 M DTPA. Perfusion images were obtained in multiple projections after intravenous injection of Tc-21m MAA.  RADIOPHARMACEUTICALS:  40 mCi Tc-36m DTPA aerosol and 6 mCi Tc-88m MAA  COMPARISON:  09/28/2011 ; correlation chest radiograph 02/26/2014  FINDINGS: Ventilation: Diffusely  diminished ventilation RIGHT upper lobe. Small subsegmental ventilation defect laterally RIGHT mid lung at lateral segment middle lobe.  Perfusion: Normal.  No perfusion defects.  Chest radiograph: Enlargement of cardiac silhouette with scattered interstitial prominence.  IMPRESSION: Normal perfusion lung scan.  Diminished ventilation RIGHT upper lobe, likely due to emphysema or parenchymal disease.   Electronically Signed   By: Lavonia Dana M.D.   On: 02/27/2014 15:34   Dg Chest Portable 1 View  02/26/2014   CLINICAL DATA:  Atrial fibrillation. History of COPD, CHF and pneumonia.  EXAM: PORTABLE CHEST - 1 VIEW  COMPARISON:  Chest radiograph Jul 24, 2013  FINDINGS: The cardiac silhouette appears moderately enlarged. Calcified aortic knob. Mild interstitial prominence, increased without pleural effusion or focal consolidation. No pneumothorax.  Surgical clips in the neck may represent thyroidectomy. Thoracolumbar levoscoliosis with moderate degenerative change.  IMPRESSION: Cardiomegaly, interstitial prominence may reflect atypical infection, possibly pulmonary edema without focal consolidation.   Electronically Signed   By: Elon Alas   On: 02/26/2014 19:42    Microbiology: Recent Results (from the past 240 hour(s))  MRSA PCR Screening     Status: None   Collection Time: 02/26/14  9:48 PM  Result Value Ref Range Status   MRSA by PCR NEGATIVE NEGATIVE Final    Comment:        The GeneXpert MRSA Assay (FDA approved for NASAL specimens only), is one component of a comprehensive MRSA colonization surveillance program. It is not intended to diagnose MRSA infection nor to guide or monitor treatment for MRSA infections.      Labs: Basic Metabolic Panel:  Recent Labs Lab 02/26/14 1550 02/27/14 0315 02/28/14 0500  NA 141 142 142  K 3.9 4.3 4.2  CL 106 107 108  CO2 27 29 28   GLUCOSE 96 97 98  BUN 21 19 21   CREATININE 1.43* 1.38* 1.48*  CALCIUM 9.1 8.8 8.8  MG  --  2.2  --     Liver Function Tests:  Recent Labs Lab 02/26/14 1550 02/27/14 0315  AST 21 17  ALT 15 12  ALKPHOS 74 63  BILITOT 0.7 0.8  PROT 6.9 5.6*  ALBUMIN 3.6 3.0*   No results for input(s): LIPASE, AMYLASE in the last 168 hours. No results for input(s): AMMONIA in the last 168 hours. CBC:  Recent Labs Lab 02/26/14 1550 02/27/14 0315  WBC 5.9 4.7  NEUTROABS 4.1 2.8  HGB 11.2* 9.9*  HCT 35.6* 32.3*  MCV 96.0 97.0  PLT 215 189   Cardiac Enzymes:  Recent Labs Lab 02/26/14 1550 02/26/14 2332 02/27/14 0315 02/27/14 1036  TROPONINI 0.03  0.03 0.03 <0.03   BNP: BNP (last 3 results)  Recent Labs  09/12/13 1638  PROBNP 84.0   CBG: No results for input(s): GLUCAP in the last 168 hours.     SignedCristal Ford  [facility] 02/28/2014, 9:28 AM

## 2014-02-28 NOTE — Progress Notes (Signed)
Pt A&O x4; pt discharge education and instructions completed with pt and family at bedside. All voices understanding and denies any questions. Pt IV and telemetry removed. Pt discharge home with daughter to transport her home. Pt transported off unit via wheelchair with daughter and belongings to the side. Delia Heady RN

## 2014-02-28 NOTE — Progress Notes (Signed)
Patient had a few runs of V-tach through out the night. Patient was assessed each time. Patient denied SOB, chest pain, anxiety, and chills. Patient also was a little tachy with pulse as high at 109. Patient states that she feels fine and does not understand why the nurse continues to think that something is wrong. RN will continue to monitor.

## 2014-03-04 ENCOUNTER — Encounter: Payer: Self-pay | Admitting: Cardiology

## 2014-03-04 ENCOUNTER — Ambulatory Visit (INDEPENDENT_AMBULATORY_CARE_PROVIDER_SITE_OTHER): Payer: Medicare Other | Admitting: Cardiology

## 2014-03-04 VITALS — BP 140/64 | HR 105 | Ht 61.0 in | Wt 178.0 lb

## 2014-03-04 DIAGNOSIS — I480 Paroxysmal atrial fibrillation: Secondary | ICD-10-CM

## 2014-03-04 DIAGNOSIS — N182 Chronic kidney disease, stage 2 (mild): Secondary | ICD-10-CM

## 2014-03-04 DIAGNOSIS — I4891 Unspecified atrial fibrillation: Secondary | ICD-10-CM

## 2014-03-04 MED ORDER — AMIODARONE HCL 200 MG PO TABS: 200.0000 mg | ORAL_TABLET | Freq: Every day | ORAL | Status: DC

## 2014-03-04 MED ORDER — APIXABAN 5 MG PO TABS: 5.0000 mg | ORAL_TABLET | Freq: Two times a day (BID) | ORAL | Status: DC

## 2014-03-04 NOTE — Assessment & Plan Note (Signed)
SCr 1.5  

## 2014-03-04 NOTE — Patient Instructions (Addendum)
Your physician has recommended you make the following change in your medication:  1) INCREASE Amiodarone to 200mg  twice daily for the next 4 days. Then take 200mg  daily 2) START Eliquis 5mg  twice daily. An Rx has been sent to your pharmacy   Your physician recommends that you schedule a follow-up appointment in: 3 weeks with Dr.Mcalhany

## 2014-03-04 NOTE — Progress Notes (Signed)
03/04/2014 [name] [name]   Sep 27, 1923  [id number]  Primary Physician Carlos Levering, PA-C Primary Cardiologist: Dr Julianne Handler  HPI:  Pleasant 80 y/o female with a history of PAF. She had been doing well on low dose Amiodarone and ASA up until 02/26/14 when she presented to [facility] with weakness and dyspnea. She was found to be in AF. She was treated with adjustment to her medications and she is seen now in follow up. She continues to have DOE. She has not fallen in a year, she is using a walker.    Current Outpatient Prescriptions  Medication Sig Dispense Refill  . allopurinol (ZYLOPRIM) 100 MG tablet Take 100 mg by mouth every morning.     Marland Kitchen amiodarone (PACERONE) 200 MG tablet Take 1 tablet (200 mg total) by mouth daily. 30 tablet 5  . amLODipine (NORVASC) 5 MG tablet Take 5 mg by mouth every morning.    Marland Kitchen aspirin EC 325 MG tablet Take 325 mg by mouth every morning.     Marland Kitchen atorvastatin (LIPITOR) 40 MG tablet Take 40 mg by mouth at bedtime.    . budesonide-formoterol (SYMBICORT) 160-4.5 MCG/ACT inhaler Inhale 2 puffs into the lungs 2 (two) times daily.    . cholecalciferol (VITAMIN D) 1000 UNITS tablet Take 1,000-2,000 Units by mouth 2 (two) times daily. Take 2000 units in the morning and 1000 units in the evening    . ferrous sulfate 325 (65 FE) MG tablet Take 325 mg by mouth 2 (two) times daily with a meal.     . fish oil-omega-3 fatty acids 1000 MG capsule Take 1 g by mouth 2 (two) times daily.     . Flaxseed, Linseed, (FLAX SEED OIL PO) Take 1 capsule by mouth 2 (two) times daily.     . furosemide (LASIX) 20 MG tablet Take 20 mg by mouth every morning.     . levalbuterol (XOPENEX HFA) 45 MCG/ACT inhaler Inhale 2 puffs into the lungs every 6 (six) hours as needed for wheezing.     . meclizine (ANTIVERT) 25 MG tablet Take 1 tablet (25 mg total) by mouth 2 (two) times daily as needed for dizziness. 30 tablet 0  . montelukast (SINGULAIR) 10 MG tablet Take 10 mg by mouth at bedtime.    Marland Kitchen  omeprazole (PRILOSEC) 40 MG capsule Take 40 mg by mouth daily.    . polyethylene glycol (MIRALAX / GLYCOLAX) packet Take 17 g by mouth daily as needed for mild constipation.    . raloxifene (EVISTA) 60 MG tablet Take 60 mg by mouth every morning.     Marland Kitchen apixaban (ELIQUIS) 5 MG TABS tablet Take 1 tablet (5 mg total) by mouth 2 (two) times daily. 60 tablet 5   No current facility-administered medications for this visit.    Allergies  Allergen Reactions  . Baby Powder [Methylbenzethonium] Shortness Of Breath  . Demerol [Meperidine] Hives  . Penicillins Hives  . Spiriva Handihaler [Tiotropium Bromide Monohydrate]     Caused chest pains    History   Social History  . Marital Status: Widowed    Spouse Name: N/A    Number of Children: N/A  . Years of Education: N/A   Occupational History  . Not on file.   Social History Main Topics  . Smoking status: Former Smoker -- 1.00 packs/day for 20 years    Types: Cigarettes    Quit date: 09/01/1983  . Smokeless tobacco: Never Used  . Alcohol Use: No  . Drug Use: No  .  Sexual Activity: No   Other Topics Concern  . Not on file   Social History Narrative     Review of Systems: General: negative for chills, fever, night sweats or weight changes.  Cardiovascular: negative for chest pain, dyspnea on exertion, edema, orthopnea, palpitations, paroxysmal nocturnal dyspnea or shortness of breath Dermatological: negative for rash Respiratory: negative for cough or wheezing Urologic: negative for hematuria Abdominal: negative for nausea, vomiting, diarrhea, bright red blood per rectum, melena, or hematemesis Neurologic: negative for visual changes, syncope, or dizziness All other systems reviewed and are otherwise negative except as noted above. Low risk Myoview July 2015 Echo July 2015- Mild AS, EF 55%   Blood pressure 140/64, pulse 105, height 5\' 1"  (1.549 m), weight 178 lb (80.74 kg).  General appearance: alert, cooperative, no  distress and moderately obese Lungs: clear to auscultation bilaterally Heart: irregularly irregular rhythm  EKG AF, PVCs  ASSESSMENT AND PLAN:   Atrial fibrillation Recurrent AF 02/26/14 with symptoms of fatigue and DOE  PAF (paroxysmal atrial fibrillation) Failed DCCV 2013 but later converted with addition of Amiodarone  CKD (chronic kidney disease) SCr < 1.5  Closed left hip fracture Hip fx after fall, taken off anticoagulation then   PLAN  I reviewed he case with Dr Julianne Handler in the office today. The plan is to increase her Amiodarone, add Eliquis 5 mg, and see her back in a couple of weeks for OP DCCV.    [facility] KPA-C 03/04/2014 3:55 PM

## 2014-03-04 NOTE — Assessment & Plan Note (Signed)
Failed DCCV 2013 but later converted with addition of Amiodarone

## 2014-03-04 NOTE — Assessment & Plan Note (Signed)
Hip fx after fall, taken off anticoagulation then

## 2014-03-04 NOTE — Assessment & Plan Note (Signed)
Recurrent AF 02/26/14 with symptoms of fatigue and DOE

## 2014-03-20 ENCOUNTER — Encounter (HOSPITAL_COMMUNITY): Payer: Self-pay | Admitting: Gastroenterology

## 2014-03-28 ENCOUNTER — Ambulatory Visit: Payer: Medicare Other | Admitting: Cardiovascular Disease

## 2014-03-31 ENCOUNTER — Encounter: Payer: Self-pay | Admitting: Physician Assistant

## 2014-03-31 ENCOUNTER — Ambulatory Visit (INDEPENDENT_AMBULATORY_CARE_PROVIDER_SITE_OTHER): Payer: Medicare Other | Admitting: Physician Assistant

## 2014-03-31 ENCOUNTER — Other Ambulatory Visit: Payer: Self-pay | Admitting: *Deleted

## 2014-03-31 VITALS — BP 132/70 | HR 106 | Ht 61.0 in | Wt 176.0 lb

## 2014-03-31 DIAGNOSIS — Z01818 Encounter for other preprocedural examination: Secondary | ICD-10-CM

## 2014-03-31 DIAGNOSIS — Z7901 Long term (current) use of anticoagulants: Secondary | ICD-10-CM

## 2014-03-31 DIAGNOSIS — I4891 Unspecified atrial fibrillation: Secondary | ICD-10-CM

## 2014-03-31 DIAGNOSIS — I10 Essential (primary) hypertension: Secondary | ICD-10-CM

## 2014-03-31 DIAGNOSIS — I5042 Chronic combined systolic (congestive) and diastolic (congestive) heart failure: Secondary | ICD-10-CM

## 2014-03-31 LAB — BASIC METABOLIC PANEL
BUN: 27 mg/dL — ABNORMAL HIGH (ref 6–23)
CALCIUM: 9.1 mg/dL (ref 8.4–10.5)
CHLORIDE: 104 meq/L (ref 96–112)
CO2: 29 meq/L (ref 19–32)
Creatinine, Ser: 1.52 mg/dL — ABNORMAL HIGH (ref 0.40–1.20)
GFR: 34.27 mL/min — ABNORMAL LOW (ref >60.00)
Glucose, Bld: 95 mg/dL (ref 70–99)
POTASSIUM: 4.2 meq/L (ref 3.5–5.1)
Sodium: 142 mEq/L (ref 135–145)

## 2014-03-31 MED ORDER — APIXABAN 2.5 MG PO TABS: 2.5000 mg | ORAL_TABLET | Freq: Two times a day (BID) | ORAL | Status: DC

## 2014-03-31 MED ORDER — METOPROLOL TARTRATE 25 MG PO TABS: 25.0000 mg | ORAL_TABLET | Freq: Two times a day (BID) | ORAL | Status: DC

## 2014-03-31 MED ORDER — APIXABAN 5 MG PO TABS: 5.0000 mg | ORAL_TABLET | Freq: Two times a day (BID) | ORAL | Status: DC

## 2014-03-31 NOTE — Patient Instructions (Signed)
Your physician has recommended you make the following change in your medication: Start taking Lopressor 25 mg twice daily  Your physician recommends that you return for lab work: Today (BMET)

## 2014-03-31 NOTE — Assessment & Plan Note (Signed)
Carotid Dopplers checked in 11/2013 and were stable

## 2014-03-31 NOTE — Assessment & Plan Note (Signed)
Patient remains in atrial fibrillation but is feeling better. She prefers not to undergo cardioversion. I discussed this patient in detail with Dr.McAlhany. He agrees that we will hold off on cardioversion. We'll add low-dose metoprolol 25 mg twice a day for better rate control. Continue amiodarone 200 mg daily. He recommends continuing Eliquis. I will check renal function today. If creatinine greater than 1.5 we will reduce her to 2.5 mg twice a day. Her creatinine was 1.48 in December. Chad2svasc is 6.

## 2014-03-31 NOTE — Assessment & Plan Note (Signed)
No evidence of heart failure on exam 

## 2014-03-31 NOTE — Assessment & Plan Note (Signed)
Blood pressure controlled. Adding metoprolol for better rate control.

## 2014-03-31 NOTE — Assessment & Plan Note (Signed)
Checking BMET today. May need to reduce Eliquis.

## 2014-03-31 NOTE — Progress Notes (Signed)
Cardiology Office Note   Date:  03/31/2014   ID:  [name] [name], DOB 06-03-1923, MRN [id number]  PCP:  [name]  Cardiologist:  Lauree Chandler   CC: fatigue    History of Present Illness: [name] [name] is a 81 y.o. female patient of Dr. Angelena Form who presents for follow-up of atrial fibrillation. She was hospitalized in 02/2014 with weakness and dyspnea and found to be in atrial fibrillation. She had been on low-dose amiodarone and aspirin. She saw Kerin Ransom in follow-up on 03/04/14 and Dr. Lonna Cobb increased her amiodarone and added Eliquis 5 mg. She is here for follow-up to be scheduled for outpatient DC CV.  Patient also has a history of CAD, HTN, HLD, COPD, carotid artery disease status post bilateral CEA, and chronic diastolic heart failure. She had previously been on Xarelto for anticoagulation with amiodarone. She had TEE DC CV and July 2013 however she reverted back to atrial fibrillation. She had multiple admissions with C. difficile colitis, esophageal candidiasis and eventually fell in 2014 fracturing her left hip and distal radius. Her anticoagulation was discontinued then because of instability and falling.  Patient comes in today feeling much better. She is no longer having palpitations or shortness of breath or fatigue. Unfortunately she is still in atrial fibrillation but her rate is fairly controlled. She is having a lot of bruising and bleeding if she bumps herself being on the Eliquis.      Past Medical History  Diagnosis Date  . Hypertension   . Hyperlipidemia   . COPD (chronic obstructive pulmonary disease)   . Osteoporosis   . H/O hiatal hernia   . Chronic back pain   . CAD (coronary artery disease)     LHC 9/05:  pLAD less than 20%, D1 20-30%, ostial RCA 40-50%, proximal RCA 20%, EF 60%.  . C. difficile diarrhea   . Gout   . Atrial fibrillation     a. failed DCCV => b. amiodarone added => converted to NSR on Amiodarone;  c. Xarelto d/c'd  2/2 falling (wrist and hip Fx in 1/14)  . Carotid stenosis     s/p bilat CEA  . C. difficile colitis     10/2011;  11/2011  . Salmonella enteritis 08/2011    c/b septic shock  . Hx of echocardiogram     a. Echocardiogram 09/29/11: Mild LVH, EF 45-50%, diffuse HK, mild to moderate aortic stenosis, mean gradient 12 mmHg, moderate MAC, mild MR, moderate LAE, moderate RAE, question small secundum ASD with left to right flow not seen in 4 chamber view, PASP 35-39 (mild pulmonary hypertension). ;  b.  TEE on 10/04/11: Mild LVH, EF 55%, mild MR, no defect or PFO  . Pneumonia 11/2101  . PONV (postoperative nausea and vomiting)   . CHF (congestive heart failure)   . Chronic kidney disease   . GERD (gastroesophageal reflux disease)   . Hepatitis   . Anemia   . Hx of echocardiogram     Echo (09/2013): EF 55%, normal wall motion, mild aortic stenosis (mean 12 mm Hg), MAC, mild MR, mild LAE, mild PI, PASP 30 mm Hg  . Hx of cardiovascular stress test     Lexiscan Myoview (7/15):  No ischemia or infarct, EF 50%, Low Risk  . Arthritis     Neck  . PAF (paroxysmal atrial fibrillation)     Past Surgical History  Procedure Laterality Date  . Carotid endarterectomy      bilateral  . Cataract extraction, bilateral    .  Joint replacement  2008    Right Total Knee  . Anal fistula repair    . Left parotidectomy    . Eye surgery    . Flexible sigmoidoscopy  09/03/2011    Procedure: FLEXIBLE SIGMOIDOSCOPY;  Surgeon: Beryle Beams, MD;  Location: [facility];  Service: Endoscopy;  Laterality: N/A;  . Tee without cardioversion  10/04/2011    Procedure: TRANSESOPHAGEAL ECHOCARDIOGRAM (TEE);  Surgeon: Larey Dresser, MD;  Location: [facility];  Service: Cardiovascular;  Laterality: N/A;  to be carelinked here by 1230-verified 7/29/dl  . Cardioversion  10/04/2011    Procedure: CARDIOVERSION;  Surgeon: Larey Dresser, MD;  Location: [facility] ENDOSCOPY;  Service: Cardiovascular;  Laterality: N/A;  .  Esophagogastroduodenoscopy  11/29/2011    Procedure: ESOPHAGOGASTRODUODENOSCOPY (EGD);  Surgeon: Cleotis Nipper, MD;  Location: [facility];  Service: Endoscopy;  Laterality: N/A;  recent h/o resp failure/pneumonia  . Left hip fracture repair  03/14/12  . Open reduction internal fixation (orif) scaphoid with distal radius graft  03/14/2012    Procedure: OPEN REDUCTION INTERNAL FIXATION (ORIF) SCAPHOID WITH DISTAL RADIUS GRAFT;  Surgeon: Jolyn Nap, MD;  Location: [facility];  Service: Orthopedics;  Laterality: Left;  DVR wrist fracture set/ hand innovation  . Hip pinning,cannulated  03/14/2012    Procedure: CANNULATED HIP PINNING;  Surgeon: Jolyn Nap, MD;  Location: [facility];  Service: Orthopedics;  Laterality: Left;  Biomet 6.5 cannulated screws  . Femur im nail Left 04/13/2012    Procedure: INTRAMEDULLARY (IM) NAIL FEMORAL;  Surgeon: Jolyn Nap, MD;  Location: [facility];  Service: Orthopedics;  Laterality: Left;  OPEN REDUCTION INTERNAL FIXATION LEFT PROXIMAL FEMUR Fracture      Current Outpatient Prescriptions  Medication Sig Dispense Refill  . allopurinol (ZYLOPRIM) 100 MG tablet Take 100 mg by mouth every morning.     Marland Kitchen amiodarone (PACERONE) 200 MG tablet Take 1 tablet (200 mg total) by mouth daily. 30 tablet 5  . amLODipine (NORVASC) 5 MG tablet Take 5 mg by mouth every morning.    Marland Kitchen apixaban (ELIQUIS) 5 MG TABS tablet Take 1 tablet (5 mg total) by mouth 2 (two) times daily. 60 tablet 5  . atorvastatin (LIPITOR) 40 MG tablet Take 40 mg by mouth at bedtime.    . budesonide-formoterol (SYMBICORT) 160-4.5 MCG/ACT inhaler Inhale 2 puffs into the lungs 2 (two) times daily.    . cholecalciferol (VITAMIN D) 1000 UNITS tablet Take 1,000-2,000 Units by mouth 2 (two) times daily. Take 2000 units in the morning and 1000 units in the evening    . ferrous sulfate 325 (65 FE) MG tablet Take 325 mg by mouth 2 (two) times daily with a meal.     . fish oil-omega-3 fatty acids 1000 MG capsule Take 1  g by mouth 2 (two) times daily.     . Flaxseed, Linseed, (FLAX SEED OIL PO) Take 1 capsule by mouth 2 (two) times daily.     . furosemide (LASIX) 20 MG tablet Take 20 mg by mouth every morning.     . levalbuterol (XOPENEX HFA) 45 MCG/ACT inhaler Inhale 2 puffs into the lungs every 6 (six) hours as needed for wheezing.     . meclizine (ANTIVERT) 25 MG tablet Take 1 tablet (25 mg total) by mouth 2 (two) times daily as needed for dizziness. 30 tablet 0  . montelukast (SINGULAIR) 10 MG tablet Take 10 mg by mouth at bedtime.    Marland Kitchen omeprazole (PRILOSEC) 40 MG capsule Take 40  mg by mouth daily.    . polyethylene glycol (MIRALAX / GLYCOLAX) packet Take 17 g by mouth daily as needed for mild constipation.    . raloxifene (EVISTA) 60 MG tablet Take 60 mg by mouth every morning.      No current facility-administered medications for this visit.    Allergies:   Baby powder; Demerol; Penicillins; and Spiriva handihaler    Social History:  The patient  reports that she quit smoking about 30 years ago. Her smoking use included Cigarettes. She has a 20 pack-year smoking history. She has never used smokeless tobacco. She reports that she does not drink alcohol or use illicit drugs.   Family History:  The patient's family history includes Deep vein thrombosis in her mother; Depression in her brother; Diabetes in her mother and sister; Diabetes type II in her mother; Hemochromatosis in her sister and sister; Hypertension in her brother, father, mother, and sister; Other in her brother.    ROS:  Please see the history of present illness.   Otherwise, review of systems are positive for none.   All other systems are reviewed and negative.    PHYSICAL EXAM: BP 132/70 mmHg  Pulse 106  Ht 5\' 1"  (1.549 m)  Wt 176 lb (79.833 kg)  BMI 33.27 kg/m2 GEN: Well nourished, well developed, in no acute distress HEENT: normal Neck: Bilateral carotid bruits, no JVD, HJR or masses Cardiac: Irregular irregular at 767 bpm;  2/6 systolic murmur at the left sternal border, no rubs, or gallops,no edema  Respiratory:  clear to auscultation bilaterally, normal work of breathing GI: soft, nontender, nondistended, + BS MS: no deformity or atrophy Skin: warm and dry, no rash Neuro:  Strength and sensation are intact Psych: euthymic mood, full affect   EKG:  EKG is ordered today. The ekg ordered today demonstrates atrial fibrillation at 106 bpm with poor R wave progression anteriorly and nonspecific ST-T wave changes, no acute change   Recent Labs: 09/12/2013: Pro B Natriuretic peptide (BNP) 84.0 02/26/2014: B Natriuretic Peptide 292.3*; TSH 1.474 02/27/2014: ALT 12; Hemoglobin 9.9*; Magnesium 2.2; Platelets 189 02/28/2014: BUN 21; Creatinine 1.48*; Potassium 4.2; Sodium 142    Lipid Panel    Component Value Date/Time   CHOL 176 03/14/2012 0635   TRIG 94 03/14/2012 0635   HDL 76 03/14/2012 0635   CHOLHDL 2.3 03/14/2012 0635   VLDL 19 03/14/2012 0635   LDLCALC 81 03/14/2012 0635      Wt Readings from Last 3 Encounters:  03/04/14 178 lb (80.74 kg)  02/28/14 180 lb (81.647 kg)  11/15/13 173 lb (78.472 kg)           Current medicines are reviewed at length with the patient today.  The patient has concerns regarding Eliquis because of easy bruising. We will check a Bmet today, if creatinine greater than 1.5 reduce Eliquis to 2.5 twice a day  The following changes have been made:  Add metoprolol 25 mg twice a day for rate control Labs/ tests ordered today include: BMET   Disposition:   FU with Ermalinda Barrios PA-C in 2-3 weeks, Dr Angelena Form in 2 months.     Sumner Boast, PA-C  03/31/2014 12:10 PM    [facility] [address], [place], [place]  [address] Phone: [phone]; Fax: [phone]

## 2014-04-04 ENCOUNTER — Telehealth: Payer: Self-pay | Admitting: Cardiovascular Disease

## 2014-04-04 NOTE — Telephone Encounter (Signed)
Spoke with pt's daughter and told her pt should continue amlodipine

## 2014-04-04 NOTE — Telephone Encounter (Signed)
New message      Is pt supposed to stop taking amlodipine?

## 2014-04-07 ENCOUNTER — Other Ambulatory Visit: Payer: Self-pay

## 2014-04-07 DIAGNOSIS — I4891 Unspecified atrial fibrillation: Secondary | ICD-10-CM

## 2014-04-07 MED ORDER — AMIODARONE HCL 200 MG PO TABS: 200.0000 mg | ORAL_TABLET | Freq: Every day | ORAL | Status: AC

## 2014-04-07 NOTE — Telephone Encounter (Signed)
Rx sent to pharmacy   

## 2014-04-14 ENCOUNTER — Encounter: Payer: Self-pay | Admitting: Physician Assistant

## 2014-04-14 ENCOUNTER — Ambulatory Visit (INDEPENDENT_AMBULATORY_CARE_PROVIDER_SITE_OTHER): Payer: Medicare Other | Admitting: Physician Assistant

## 2014-04-14 ENCOUNTER — Other Ambulatory Visit: Payer: Self-pay

## 2014-04-14 VITALS — BP 130/80 | HR 87 | Ht 61.0 in | Wt 180.0 lb

## 2014-04-14 DIAGNOSIS — I5042 Chronic combined systolic (congestive) and diastolic (congestive) heart failure: Secondary | ICD-10-CM

## 2014-04-14 DIAGNOSIS — I4819 Other persistent atrial fibrillation: Secondary | ICD-10-CM

## 2014-04-14 DIAGNOSIS — I481 Persistent atrial fibrillation: Secondary | ICD-10-CM

## 2014-04-14 MED ORDER — APIXABAN 2.5 MG PO TABS: 2.5000 mg | ORAL_TABLET | Freq: Two times a day (BID) | ORAL | Status: DC

## 2014-04-14 MED ORDER — DILTIAZEM HCL ER COATED BEADS 240 MG PO CP24: 240.0000 mg | ORAL_CAPSULE | Freq: Every day | ORAL | Status: DC

## 2014-04-14 NOTE — Assessment & Plan Note (Signed)
Patient has increased dyspnea on exertion, edema and 4 pound weight gain. Increase Lasix to 40 mg once daily for 3 days then 20 mg once daily. 2 g sodium diet.

## 2014-04-14 NOTE — Patient Instructions (Signed)
Your physician has recommended you make the following change in your medication:  1) STOP Amlodipine 2) STOP Metoprolol 3) INCREASE Lasix to 2 tablets daily for 2-3 days 4) START Diltiazem 240mg  daily. An Rx has been sent to your pharmacy  Your physician recommends that you schedule a follow-up appointment in: 2 weeks with Estella Husk, PA      Low-Sodium Eating Plan Sodium raises blood pressure and causes water to be held in the body. Getting less sodium from food will help lower your blood pressure, reduce any swelling, and protect your heart, liver, and kidneys. We get sodium by adding salt (sodium chloride) to food. Most of our sodium comes from canned, boxed, and frozen foods. Restaurant foods, fast foods, and pizza are also very high in sodium. Even if you take medicine to lower your blood pressure or to reduce fluid in your body, getting less sodium from your food is important. WHAT IS MY PLAN? Most people should limit their sodium intake to 2,300 mg a day. Your health care provider recommends that you limit your sodium intake to __________ a day.  WHAT DO I NEED TO KNOW ABOUT THIS EATING PLAN? For the low-sodium eating plan, you will follow these general guidelines:  Choose foods with a % Daily Value for sodium of less than 5% (as listed on the food label).   Use salt-free seasonings or herbs instead of table salt or sea salt.   Check with your health care provider or pharmacist before using salt substitutes.   Eat fresh foods.  Eat more vegetables and fruits.  Limit canned vegetables. If you do use them, rinse them well to decrease the sodium.   Limit cheese to 1 oz (28 g) per day.   Eat lower-sodium products, often labeled as "lower sodium" or "no salt added."  Avoid foods that contain monosodium glutamate (MSG). MSG is sometimes added to [place] food and some canned foods.  Check food labels (Nutrition Facts labels) on foods to learn how much sodium is in one  serving.  Eat more home-cooked food and less restaurant, buffet, and fast food.  When eating at a restaurant, ask that your food be prepared with less salt or none, if possible.  HOW DO I READ FOOD LABELS FOR SODIUM INFORMATION? The Nutrition Facts label lists the amount of sodium in one serving of the food. If you eat more than one serving, you must multiply the listed amount of sodium by the number of servings. Food labels may also identify foods as:  Sodium free--Less than 5 mg in a serving.  Very low sodium--35 mg or less in a serving.  Low sodium--140 mg or less in a serving.  Light in sodium--50% less sodium in a serving. For example, if a food that usually has 300 mg of sodium is changed to become light in sodium, it will have 150 mg of sodium.  Reduced sodium--25% less sodium in a serving. For example, if a food that usually has 400 mg of sodium is changed to reduced sodium, it will have 300 mg of sodium. WHAT FOODS CAN I EAT? Grains Low-sodium cereals, including oats, puffed wheat and rice, and shredded wheat cereals. Low-sodium crackers. Unsalted rice and pasta. Lower-sodium bread.  Vegetables Frozen or fresh vegetables. Low-sodium or reduced-sodium canned vegetables. Low-sodium or reduced-sodium tomato sauce and paste. Low-sodium or reduced-sodium tomato and vegetable juices.  Fruits Fresh, frozen, and canned fruit. Fruit juice.  Meat and Other Protein Products Low-sodium canned tuna and salmon. Fresh  or frozen meat, poultry, seafood, and fish. Lamb. Unsalted nuts. Dried beans, peas, and lentils without added salt. Unsalted canned beans. Homemade soups without salt. Eggs.  Dairy Milk. Soy milk. Ricotta cheese. Low-sodium or reduced-sodium cheeses. Yogurt.  Condiments Fresh and dried herbs and spices. Salt-free seasonings. Onion and garlic powders. Low-sodium varieties of mustard and ketchup. Lemon juice.  Fats and Oils Reduced-sodium salad dressings. Unsalted  butter.  Other Unsalted popcorn and pretzels.  The items listed above may not be a complete list of recommended foods or beverages. Contact your dietitian for more options. WHAT FOODS ARE NOT RECOMMENDED? Grains Instant hot cereals. Bread stuffing, pancake, and biscuit mixes. Croutons. Seasoned rice or pasta mixes. Noodle soup cups. Boxed or frozen macaroni and cheese. Self-rising flour. Regular salted crackers. Vegetables Regular canned vegetables. Regular canned tomato sauce and paste. Regular tomato and vegetable juices. Frozen vegetables in sauces. Salted french fries. Olives. Angie Fava. Relishes. Sauerkraut. Salsa. Meat and Other Protein Products Salted, canned, smoked, spiced, or pickled meats, seafood, or fish. Bacon, ham, sausage, hot dogs, corned beef, chipped beef, and packaged luncheon meats. Salt pork. Jerky. Pickled herring. Anchovies, regular canned tuna, and sardines. Salted nuts. Dairy Processed cheese and cheese spreads. Cheese curds. Blue cheese and cottage cheese. Buttermilk.  Condiments Onion and garlic salt, seasoned salt, table salt, and sea salt. Canned and packaged gravies. Worcestershire sauce. Tartar sauce. Barbecue sauce. Teriyaki sauce. Soy sauce, including reduced sodium. Steak sauce. Fish sauce. Oyster sauce. Cocktail sauce. Horseradish. Regular ketchup and mustard. Meat flavorings and tenderizers. Bouillon cubes. Hot sauce. Tabasco sauce. Marinades. Taco seasonings. Relishes. Fats and Oils Regular salad dressings. Salted butter. Margarine. Ghee. Bacon fat.  Other Potato and tortilla chips. Corn chips and puffs. Salted popcorn and pretzels. Canned or dried soups. Pizza. Frozen entrees and pot pies.  The items listed above may not be a complete list of foods and beverages to avoid. Contact your dietitian for more information. Document Released: 08/13/2001 Document Revised: 02/26/2013 Document Reviewed: 12/26/2012 [facility] Patient Information 2015 [place],  [place]. This information is not intended to replace advice given to you by your health care provider. Make sure you discuss any questions you have with your health care provider.

## 2014-04-14 NOTE — Progress Notes (Signed)
Cardiology Office Note   Date:  04/14/2014   ID:  [name] [name], DOB [birth year], MRN [id number]  PCP:  [name]  Cardiologist:  Dr. Angelena Form   CC: shortness of breath and edema    History of Present Illness: [name] [name] is a 81 y.o. female who presents for follow up of atrial fibrillation. I last saw her in 03/31/14 at which time she remained in atrial fibrillation on amiodarone and Eliquis. She was to be scheduled for an outpatient DC cardioversion but was feeling much better and did not want to proceed with cardioversion. After discussion with Dr.McAlhany, he decided to leave her in atrial fibrillation. We did decrease her Eliquis to 2.5 mg twice a day because her creatinine was over 1.5.We added metoprolol 25 mg BID for better rate control.  Patient also has history of CAD, hypertension, HLD, COPD, carotid artery disease status post bilateral CEA, and chronic diastolic heart failure. She said multiple admissions with C. difficile colitis, esophageal candidiasis, and eventually fell in 2014 fracturing her left hip and distal radius. Her anticoagulation was discontinued them because of instability and falling.  Patient comes in today not feeling well. She says she can't walk 10 feet without getting out of breath. This has worsened since we added the metoprolol. She has also developed an increase in swelling of her legs. Her daughter had told her to take extra Lasix but she didn't do this. She does like salt and uses saltshaker frequently.She can't tell if her heart is racing or not.    Past Medical History  Diagnosis Date  . Hypertension   . Hyperlipidemia   . COPD (chronic obstructive pulmonary disease)   . Osteoporosis   . H/O hiatal hernia   . Chronic back pain   . CAD (coronary artery disease)     LHC 9/05:  pLAD less than 20%, D1 20-30%, ostial RCA 40-50%, proximal RCA 20%, EF 60%.  . C. difficile diarrhea   . Gout   . Atrial fibrillation     a. failed DCCV =>  b. amiodarone added => converted to NSR on Amiodarone;  c. Xarelto d/c'd 2/2 falling (wrist and hip Fx in 1/14)  . Carotid stenosis     s/p bilat CEA  . C. difficile colitis     10/2011;  11/2011  . Salmonella enteritis 08/2011    c/b septic shock  . Hx of echocardiogram     a. Echocardiogram 09/29/11: Mild LVH, EF 45-50%, diffuse HK, mild to moderate aortic stenosis, mean gradient 12 mmHg, moderate MAC, mild MR, moderate LAE, moderate RAE, question small secundum ASD with left to right flow not seen in 4 chamber view, PASP 35-39 (mild pulmonary hypertension). ;  b.  TEE on 10/04/11: Mild LVH, EF 55%, mild MR, no defect or PFO  . Pneumonia 11/2101  . PONV (postoperative nausea and vomiting)   . CHF (congestive heart failure)   . Chronic kidney disease   . GERD (gastroesophageal reflux disease)   . Hepatitis   . Anemia   . Hx of echocardiogram     Echo (09/2013): EF 55%, normal wall motion, mild aortic stenosis (mean 12 mm Hg), MAC, mild MR, mild LAE, mild PI, PASP 30 mm Hg  . Hx of cardiovascular stress test     Lexiscan Myoview (7/15):  No ischemia or infarct, EF 50%, Low Risk  . Arthritis     Neck  . PAF (paroxysmal atrial fibrillation)     Past Surgical History  Procedure  Laterality Date  . Carotid endarterectomy      bilateral  . Cataract extraction, bilateral    . Joint replacement  2008    Right Total Knee  . Anal fistula repair    . Left parotidectomy    . Eye surgery    . Flexible sigmoidoscopy  09/03/2011    Procedure: FLEXIBLE SIGMOIDOSCOPY;  Surgeon: Beryle Beams, MD;  Location: [facility];  Service: Endoscopy;  Laterality: N/A;  . Tee without cardioversion  10/04/2011    Procedure: TRANSESOPHAGEAL ECHOCARDIOGRAM (TEE);  Surgeon: Larey Dresser, MD;  Location: [facility];  Service: Cardiovascular;  Laterality: N/A;  to be carelinked here by 1230-verified 7/29/dl  . Cardioversion  10/04/2011    Procedure: CARDIOVERSION;  Surgeon: Larey Dresser, MD;  Location[facility];  Service: Cardiovascular;  Laterality: N/A;  . Esophagogastroduodenoscopy  11/29/2011    Procedure: ESOPHAGOGASTRODUODENOSCOPY (EGD);  Surgeon: Cleotis Nipper, MD;  Location: [facility];  Service: Endoscopy;  Laterality: N/A;  recent h/o resp failure/pneumonia  . Left hip fracture repair  03/14/12  . Open reduction internal fixation (orif) scaphoid with distal radius graft  03/14/2012    Procedure: OPEN REDUCTION INTERNAL FIXATION (ORIF) SCAPHOID WITH DISTAL RADIUS GRAFT;  Surgeon: Jolyn Nap, MD;  Location: [facility];  Service: Orthopedics;  Laterality: Left;  DVR wrist fracture set/ hand innovation  . Hip pinning,cannulated  03/14/2012    Procedure: CANNULATED HIP PINNING;  Surgeon: Jolyn Nap, MD;  Location: [facility];  Service: Orthopedics;  Laterality: Left;  Biomet 6.5 cannulated screws  . Femur im nail Left 04/13/2012    Procedure: INTRAMEDULLARY (IM) NAIL FEMORAL;  Surgeon: Jolyn Nap, MD;  Location: [facility];  Service: Orthopedics;  Laterality: Left;  OPEN REDUCTION INTERNAL FIXATION LEFT PROXIMAL FEMUR Fracture      Current Outpatient Prescriptions  Medication Sig Dispense Refill  . allopurinol (ZYLOPRIM) 100 MG tablet Take 100 mg by mouth every morning.     Marland Kitchen amiodarone (PACERONE) 200 MG tablet Take 1 tablet (200 mg total) by mouth daily. 90 tablet 4  . amLODipine (NORVASC) 5 MG tablet Take 5 mg by mouth every morning.    Marland Kitchen apixaban (ELIQUIS) 2.5 MG TABS tablet Take 1 tablet (2.5 mg total) by mouth 2 (two) times daily. 180 tablet 3  . atorvastatin (LIPITOR) 40 MG tablet Take 40 mg by mouth at bedtime.    . budesonide-formoterol (SYMBICORT) 160-4.5 MCG/ACT inhaler Inhale 2 puffs into the lungs 2 (two) times daily.    . cholecalciferol (VITAMIN D) 1000 UNITS tablet Take 1,000-2,000 Units by mouth 2 (two) times daily. Take 2000 units in the morning and 1000 units in the evening    . ferrous sulfate 325 (65 FE) MG tablet Take 325 mg by mouth 2 (two) times daily with a  meal.     . fish oil-omega-3 fatty acids 1000 MG capsule Take 1 g by mouth 2 (two) times daily.     . Flaxseed, Linseed, (FLAX SEED OIL PO) Take 1 capsule by mouth 2 (two) times daily.     . furosemide (LASIX) 20 MG tablet Take 20 mg by mouth every morning.     . levalbuterol (XOPENEX HFA) 45 MCG/ACT inhaler Inhale 2 puffs into the lungs every 6 (six) hours as needed for wheezing.     . meclizine (ANTIVERT) 25 MG tablet Take 1 tablet (25 mg total) by mouth 2 (two) times daily as needed for dizziness. 30 tablet 0  . metoprolol tartrate (  LOPRESSOR) 25 MG tablet Take 1 tablet (25 mg total) by mouth 2 (two) times daily. 180 tablet 1  . montelukast (SINGULAIR) 10 MG tablet Take 10 mg by mouth at bedtime.    Marland Kitchen omeprazole (PRILOSEC) 40 MG capsule Take 40 mg by mouth daily.    . polyethylene glycol (MIRALAX / GLYCOLAX) packet Take 17 g by mouth daily as needed for mild constipation.    . raloxifene (EVISTA) 60 MG tablet Take 60 mg by mouth every morning.      No current facility-administered medications for this visit.    Allergies:   Baby powder; Demerol; Penicillins; and Spiriva handihaler    Social History:  The patient  reports that she quit smoking about 30 years ago. Her smoking use included Cigarettes. She has a 20 pack-year smoking history. She has never used smokeless tobacco. She reports that she does not drink alcohol or use illicit drugs.   Family History:  The patient's family history includes Deep vein thrombosis in her mother; Depression in her brother; Diabetes in her mother and sister; Diabetes type II in her mother; Hemochromatosis in her sister and sister; Hypertension in her brother, father, mother, and sister; Other in her brother.    ROS:  Please see the history of present illness.   Otherwise, review of systems are positive for balance issues.   All other systems are reviewed and negative.    PHYSICAL EXAM: BP 130/80 mmHg  Pulse 87  Ht 5\' 1"  (1.549 m)  Wt 180 lb (81.647  kg)  BMI 34.03 kg/m2 GEN: Obese, well developed, in no acute distress HEENT: normal Neck: no JVD, carotid bruits, or masses Cardiac: Irregular, irregular at 87 bpm, 2/6 systolic murmur LSB, no rubs, or gallops,no edema  Respiratory:  Decreased breath sounds with expiratory wheezing. GI: soft, nontender, nondistended, + BS MS: no deformity or atrophy Skin: warm and dry, no rash Neuro:  Strength and sensation are intact Psych: euthymic mood, full affect   EKG:  EKG is ordered today. The ekg ordered today demonstrates atrial fibrillation at 87 bpm, PVC, nonspecific ST T changes.   Recent Labs: 09/12/2013: Pro B Natriuretic peptide (BNP) 84.0 02/26/2014: B Natriuretic Peptide 292.3*; TSH 1.474 02/27/2014: ALT 12; Hemoglobin 9.9*; Magnesium 2.2; Platelets 189 03/31/2014: BUN 27*; Creatinine 1.52*; Potassium 4.2; Sodium 142    Lipid Panel    Component Value Date/Time   CHOL 176 03/14/2012 0635   TRIG 94 03/14/2012 0635   HDL 76 03/14/2012 0635   CHOLHDL 2.3 03/14/2012 0635   VLDL 19 03/14/2012 0635   LDLCALC 81 03/14/2012 0635      Wt Readings from Last 3 Encounters:  03/31/14 176 lb (79.833 kg)  03/04/14 178 lb (80.74 kg)  02/28/14 180 lb (81.647 kg)            Signed, Ermalinda Barrios, PA-C  04/14/2014 11:50 AM    [address], [place], [place]  [address] Phone: [phone]; Fax: [phone]

## 2014-04-14 NOTE — Assessment & Plan Note (Signed)
Patient remains in atrial fibrillation at 87 bpm. She complains of worsening symptoms of dyspnea on exertion and wheezing since metoprolol was added. I will DC her metoprolol and add diltiazem 240 mg once a day. We will also stop Norvasc. I will see her back in 2 weeks for follow-up.

## 2014-04-14 NOTE — Assessment & Plan Note (Signed)
Blood pressure controlled. 

## 2014-04-16 ENCOUNTER — Telehealth: Payer: Self-pay | Admitting: Cardiovascular Disease

## 2014-04-16 NOTE — Telephone Encounter (Signed)
New Msg      Pt daughter calling states that pt felt as if she wasn't getting enough oxygen.   EMS came last night and oxygen was 95%.   Pt has started a new medication Diltiazem, could this be the cause?  Please return call to daughter [name].

## 2014-04-16 NOTE — Telephone Encounter (Signed)
Spoke with [name] pt's daughter. She states that pt was seen Monday by Estella Husk PA she started a new medication Diltiazem. Yesterday pt felt well, she did  a lot of work at home, but during the night pt started to feel like she was not getting  enough oxygen. She felt SOB, chest pain. ( pt has a history of COPD) EMS was called, EKG was done and which it was fine; nothing out of the ordinary, 02 saturation was 95% the BP was fine. She was advised to go to the ER if she though she needed to go . Pt refused. Cindy spend the night with pt ; she sleeped well the rest of the night. Pt  today  is feeling much better. Daughter would like to know if pt needs to be seen,  she would like to take it ease today and see how she does today.  Pt's daughter is aware because pt feels better and the EMS  EKG and BP were okay. She may needs to see how she feels later on today. Daughter is aware to call if needed. Daughter verbalized understanding.

## 2014-04-17 ENCOUNTER — Telehealth: Payer: Self-pay | Admitting: Cardiovascular Disease

## 2014-04-17 NOTE — Telephone Encounter (Signed)
Thanks

## 2014-04-17 NOTE — Telephone Encounter (Signed)
New Message     Patients Daughter [name] is returning nurses call.   Please give a call back.

## 2014-04-17 NOTE — Telephone Encounter (Signed)
[name] [name], Can you check on her today? Thanks, chris

## 2014-04-17 NOTE — Telephone Encounter (Signed)
See previous phone note that addresses this

## 2014-04-17 NOTE — Telephone Encounter (Signed)
Left message for [name] [name] to call back.

## 2014-04-17 NOTE — Telephone Encounter (Signed)
Spoke with [name] (pt's daughter). She reports pt felt tired yesterday but not having any pain. Feels pt overdid recently--cleaning and cooking.  Pt rested yesterday. Today [name] reports pt feels good. Shortness of breath is stable.Shortness of breath has not increased. No pain.  Has follow up appt on Apr 28, 2014

## 2014-04-20 ENCOUNTER — Telehealth: Payer: Self-pay | Admitting: Physician Assistant

## 2014-04-20 NOTE — Telephone Encounter (Signed)
Thanks, chris 

## 2014-04-20 NOTE — Telephone Encounter (Signed)
Patient's daughter called answering service on Sunday. Last night, patient accidentally took Eliquis and Singular at 7:30pm and again at 10:30pm. On 2.5mg  BID for afib. Age 81, Cr 1.52, wt 81kg. Family helps monitor her meds. D/w pharmacy at [facility] who recommended to skip this morning's dose of Eliquis and resume this evening on schedule. Skip singulair today and resume tomorrow. BP was also initially running 87/57 earlier but asymptomatic. F/u readings 92/44 then 106/46. Advised daughter to hold Lasix this AM and follow. They will call back tomorrow with an update of BP and if stable at that time would resume Lasix. Daughter verbalized understanding and gratitude. Loreen Bankson PA-C

## 2014-04-21 ENCOUNTER — Telehealth: Payer: Self-pay | Admitting: Cardiovascular Disease

## 2014-04-21 NOTE — Telephone Encounter (Signed)
New Msg          Pt daughter calling, states her mother has mixed up her time frames for taking medications.  What should they do about this? Daughter would like a call back.

## 2014-04-21 NOTE — Telephone Encounter (Signed)
Daughter calling stating her Mom got mixed up on her meds again last PM. She had to call Danya Dunn,PA on Sat nite due to mix up. Last night she took her morning dose of Amiodarone and also took again last PM; took Diltiazem CD morning and evening; Eliquis morning dose was held but took PM dose; Lasix: was told by Danya to hold AM dose but she took 20 mg in PM.  BP has been low; today 92/52; 100/51; 86/56 HR 81. States the two newest meds are Eliquis and Dilitiazem.  Has not taken any medications this AM until she talked with [place]. Daughter states that she feels fine this AM and is very alert. Daughter states she is going to get a container to put AM meds in kitchen and another container to put PM doses in BR.  Spoke w/ Shaune Spittle) who suggest that she hold Amiodarone, Diltiazem and Lasix today and restart tomorrow. Also she needs to call her PCP regarding her confusion that she might have a UTI.  She understands and will call her PCP today. She verbalizes understanding regarding medication dosages.

## 2014-04-23 ENCOUNTER — Telehealth: Payer: Self-pay | Admitting: Cardiovascular Disease

## 2014-04-23 NOTE — Telephone Encounter (Signed)
New message      Pt c/o BP issue: STAT if pt c/o blurred vision, one-sided weakness or slurred speech  1. What are your last 5 BP readings? Last night 10:20 135/55 then 100/50; today 10:45 106/59, 2:30 107/61, 4:30 104/51  2. Are you having any other symptoms (ex. Dizziness, headache, blurred vision, passed out)?no  3. What is your BP issue? Pt was to report low bp readings.  Pt has been staying with a daughter.  Can she go home tonight after dinner?

## 2014-04-23 NOTE — Telephone Encounter (Signed)
Calling because pt was told by PCP to stay with daughter last night because BP was low.  She was instructed to call or report to the ED if BP is less than 90.  BP has been good today as listed below.  Daughter is wanting to know if pt needs to continue to stay there because she really wants to go home.  Advised as long as BP is as listed and she is not having any s/s she is OK to go home tonight.  They should continue to monitor her BP and call back if problems.

## 2014-04-28 ENCOUNTER — Ambulatory Visit (INDEPENDENT_AMBULATORY_CARE_PROVIDER_SITE_OTHER): Payer: Medicare Other | Admitting: Physician Assistant

## 2014-04-28 ENCOUNTER — Encounter: Payer: Self-pay | Admitting: Physician Assistant

## 2014-04-28 VITALS — BP 115/57 | HR 79 | Ht 61.0 in | Wt 172.0 lb

## 2014-04-28 DIAGNOSIS — I10 Essential (primary) hypertension: Secondary | ICD-10-CM

## 2014-04-28 DIAGNOSIS — I5042 Chronic combined systolic (congestive) and diastolic (congestive) heart failure: Secondary | ICD-10-CM

## 2014-04-28 DIAGNOSIS — I481 Persistent atrial fibrillation: Secondary | ICD-10-CM

## 2014-04-28 DIAGNOSIS — I4819 Other persistent atrial fibrillation: Secondary | ICD-10-CM

## 2014-04-28 LAB — BASIC METABOLIC PANEL
BUN: 31 mg/dL — ABNORMAL HIGH (ref 6–23)
CHLORIDE: 102 meq/L (ref 96–112)
CO2: 29 mEq/L (ref 19–32)
CREATININE: 1.69 mg/dL — AB (ref 0.40–1.20)
Calcium: 9.5 mg/dL (ref 8.4–10.5)
GFR: 30.32 mL/min — ABNORMAL LOW (ref >60.00)
Glucose, Bld: 96 mg/dL (ref 70–99)
Potassium: 4.5 mEq/L (ref 3.5–5.1)
Sodium: 138 mEq/L (ref 135–145)

## 2014-04-28 NOTE — Assessment & Plan Note (Signed)
Patient's heart rate is controlled on diltiazem 240 mg once daily. Blood pressure is stable. She did not tolerate beta blockers as she had wheezing on it. Continue current medicines.

## 2014-04-28 NOTE — Patient Instructions (Addendum)
Your physician recommends that you continue on your current medications as directed. Please refer to the Current Medication list given to you today.    Your physician recommends that you return for lab work  [place]

## 2014-04-28 NOTE — Progress Notes (Signed)
Cardiology Office Note   Date:  04/28/2014   ID:  [name], [name] [birth year], MRN [id number]  PCP:  [name]  Cardiologist: Lauree Chandler, MD  Chief Complaint: Atrial fibrillation    History of Present Illness: [name] [name] is a 81 y.o. female who presents for follow-up of atrial fibrillation. I initially saw her in 03/31/14 at which time she was in atrial fibrillation on amiodarone and Eliquis. She was feeling better and did not want to proceed with DC cardioversion so Dr.McAlhany agreed to leave her in atrial fibrillation. Her Eliquis was decreased to 2.5 mg twice a day because her creatinine was over 1.5. We added metoprolol 25 mg twice a day for better rate control. I saw her again on 04/14/14 at which time she was not feeling well. She was out of breath with very little walking and had swelling in her legs. She was getting a lot of extra salt in her diet. She was also having wheezing since metoprolol was added. We stopped this and started diltiazem 240 mg once a day. We also stopped the Norvasc. I increase Lasix to 40 mg daily for 3 days and then 20 mg daily.  Since that time there were multiple phone notes describing that the patient had taken extra medications and BP was very low. She was advised to hold medications and her blood pressure improved. The daughter was supposed to get a pill organizer for her.  Patient is doing much better today. Her breathing has improved, her edema has resolved and they have straighten her medications out. She still doesn't have a lot of energy but feels much better.    Past Medical History  Diagnosis Date  . Hypertension   . Hyperlipidemia   . COPD (chronic obstructive pulmonary disease)   . Osteoporosis   . H/O hiatal hernia   . Chronic back pain   . CAD (coronary artery disease)     LHC 9/05:  pLAD less than 20%, D1 20-30%, ostial RCA 40-50%, proximal RCA 20%, EF 60%.  . C. difficile diarrhea   . Gout   . Atrial  fibrillation     a. failed DCCV => b. amiodarone added => converted to NSR on Amiodarone;  c. Xarelto d/c'd 2/2 falling (wrist and hip Fx in 1/14)  . Carotid stenosis     s/p bilat CEA  . C. difficile colitis     10/2011;  11/2011  . Salmonella enteritis 08/2011    c/b septic shock  . Hx of echocardiogram     a. Echocardiogram 09/29/11: Mild LVH, EF 45-50%, diffuse HK, mild to moderate aortic stenosis, mean gradient 12 mmHg, moderate MAC, mild MR, moderate LAE, moderate RAE, question small secundum ASD with left to right flow not seen in 4 chamber view, PASP 35-39 (mild pulmonary hypertension). ;  b.  TEE on 10/04/11: Mild LVH, EF 55%, mild MR, no defect or PFO  . Pneumonia 11/2101  . PONV (postoperative nausea and vomiting)   . CHF (congestive heart failure)   . Chronic kidney disease   . GERD (gastroesophageal reflux disease)   . Hepatitis   . Anemia   . Hx of echocardiogram     Echo (09/2013): EF 55%, normal wall motion, mild aortic stenosis (mean 12 mm Hg), MAC, mild MR, mild LAE, mild PI, PASP 30 mm Hg  . Hx of cardiovascular stress test     Lexiscan Myoview (7/15):  No ischemia or infarct, EF 50%, Low Risk  . Arthritis  Neck  . PAF (paroxysmal atrial fibrillation)     Past Surgical History  Procedure Laterality Date  . Carotid endarterectomy      bilateral  . Cataract extraction, bilateral    . Joint replacement  2008    Right Total Knee  . Anal fistula repair    . Left parotidectomy    . Eye surgery    . Flexible sigmoidoscopy  09/03/2011    Procedure: FLEXIBLE SIGMOIDOSCOPY;  Surgeon: Beryle Beams, MD;  Location: [facility];  Service: Endoscopy;  Laterality: N/A;  . Tee without cardioversion  10/04/2011    Procedure: TRANSESOPHAGEAL ECHOCARDIOGRAM (TEE);  Surgeon: Larey Dresser, MD;  Location: [place];  Service: Cardiovascular;  Laterality: N/A;  to be carelinked here by 1230-verified 7/29/dl  . Cardioversion  10/04/2011    Procedure: CARDIOVERSION;  Surgeon:  Larey Dresser, MD;  Location: [facility] ENDOSCOPY;  Service: Cardiovascular;  Laterality: N/A;  . Esophagogastroduodenoscopy  11/29/2011    Procedure: ESOPHAGOGASTRODUODENOSCOPY (EGD);  Surgeon: Cleotis Nipper, MD;  Location: [facility];  Service: Endoscopy;  Laterality: N/A;  recent h/o resp failure/pneumonia  . Left hip fracture repair  03/14/12  . Open reduction internal fixation (orif) scaphoid with distal radius graft  03/14/2012    Procedure: OPEN REDUCTION INTERNAL FIXATION (ORIF) SCAPHOID WITH DISTAL RADIUS GRAFT;  Surgeon: Jolyn Nap, MD;  Location: [facility];  Service: Orthopedics;  Laterality: Left;  DVR wrist fracture set/ hand innovation  . Hip pinning,cannulated  03/14/2012    Procedure: CANNULATED HIP PINNING;  Surgeon: Jolyn Nap, MD;  Location: [facility];  Service: Orthopedics;  Laterality: Left;  Biomet 6.5 cannulated screws  . Femur im nail Left 04/13/2012    Procedure: INTRAMEDULLARY (IM) NAIL FEMORAL;  Surgeon: Jolyn Nap, MD;  Location: [place];  Service: Orthopedics;  Laterality: Left;  OPEN REDUCTION INTERNAL FIXATION LEFT PROXIMAL FEMUR Fracture      Current Outpatient Prescriptions  Medication Sig Dispense Refill  . allopurinol (ZYLOPRIM) 100 MG tablet Take 100 mg by mouth every morning.     Marland Kitchen amiodarone (PACERONE) 200 MG tablet Take 1 tablet (200 mg total) by mouth daily. 90 tablet 4  . apixaban (ELIQUIS) 2.5 MG TABS tablet Take 1 tablet (2.5 mg total) by mouth 2 (two) times daily. 180 tablet 3  . atorvastatin (LIPITOR) 40 MG tablet Take 40 mg by mouth at bedtime.    . budesonide-formoterol (SYMBICORT) 160-4.5 MCG/ACT inhaler Inhale 2 puffs into the lungs 2 (two) times daily.    . cholecalciferol (VITAMIN D) 1000 UNITS tablet Take 1,000-2,000 Units by mouth 2 (two) times daily. Take 2000 units in the morning and 1000 units in the evening    . diltiazem (CARDIZEM CD) 240 MG 24 hr capsule Take 1 capsule (240 mg total) by mouth daily. 30 capsule 1  . ferrous sulfate  325 (65 FE) MG tablet Take 325 mg by mouth 2 (two) times daily with a meal.     . fish oil-omega-3 fatty acids 1000 MG capsule Take 1 g by mouth 2 (two) times daily.     . Flaxseed, Linseed, (FLAX SEED OIL PO) Take 1 capsule by mouth 2 (two) times daily.     . furosemide (LASIX) 20 MG tablet Take 20 mg by mouth every morning.     . levalbuterol (XOPENEX HFA) 45 MCG/ACT inhaler Inhale 2 puffs into the lungs every 6 (six) hours as needed for wheezing.     . meclizine (ANTIVERT) 25 MG tablet Take  1 tablet (25 mg total) by mouth 2 (two) times daily as needed for dizziness. 30 tablet 0  . montelukast (SINGULAIR) 10 MG tablet Take 10 mg by mouth at bedtime.    Marland Kitchen omeprazole (PRILOSEC) 40 MG capsule Take 40 mg by mouth daily.    . polyethylene glycol (MIRALAX / GLYCOLAX) packet Take 17 g by mouth daily as needed for mild constipation.    . raloxifene (EVISTA) 60 MG tablet Take 60 mg by mouth every morning.      No current facility-administered medications for this visit.    Allergies:   Baby powder; Demerol; Penicillins; and Spiriva handihaler    Social History:  The patient  reports that she quit smoking about 30 years ago. Her smoking use included Cigarettes. She has a 20 pack-year smoking history. She has never used smokeless tobacco. She reports that she does not drink alcohol or use illicit drugs.   Family History:  The patient's family history includes Deep vein thrombosis in her mother; Depression in her brother; Diabetes in her mother and sister; Diabetes type II in her mother; Hemochromatosis in her sister and sister; Hypertension in her brother, father, mother, and sister; Other in her brother.    ROS:  Please see the history of present illness.   Otherwise, review of systems are positive for chronic back pain, bilateral hearing aids.   All other systems are reviewed and negative.    PHYSICAL EXAM: BP 115/57 mmHg  Pulse 79  Ht 5\' 1"  (1.549 m)  Wt 172 lb (78.019 kg)  BMI 32.52  kg/m2 GEN: Obese, well developed, in no acute distress HEENT: normal Neck: Bilateral carotid bruits, no JVD, HJR,  or masses Cardiac: Irregular irregular with 2/6 systolic murmur at the left sternal border,; no gallop , rubs, thrill or heave,no edema,   Respiratory: Decreased breath sounds clear to auscultation bilaterally, normal work of breathing GI: soft, nontender, nondistended, + BS MS: no deformity or atrophy Extremities: without cyanosis, clubbing, edema, good distal pulses bilaterally.  Skin: warm and dry, no rash Neuro:  Strength and sensation are intact Psych: euthymic mood, full affect   EKG:  EKG is ordered today. The ekg ordered today demonstrates  Recent Labs: 09/12/2013: Pro B Natriuretic peptide (BNP) 84.0 02/26/2014: B Natriuretic Peptide 292.3*; TSH 1.474 02/27/2014: ALT 12; Hemoglobin 9.9*; Magnesium 2.2; Platelets 189 03/31/2014: BUN 27*; Creatinine 1.52*; Potassium 4.2; Sodium 142    Lipid Panel    Component Value Date/Time   CHOL 176 03/14/2012 0635   TRIG 94 03/14/2012 0635   HDL 76 03/14/2012 0635   CHOLHDL 2.3 03/14/2012 0635   VLDL 19 03/14/2012 0635   LDLCALC 81 03/14/2012 0635      Wt Readings from Last 3 Encounters:  04/14/14 180 lb (81.647 kg)  03/31/14 176 lb (79.833 kg)  03/04/14 178 lb (80.74 kg)      Other studies Reviewed: Additional studies/ records that were reviewed today include:  Carotid Dopplers were reviewed from 11/2013 and were stable. Repeat in September 2016.  ASSESSMENT AND PLAN: Atrial fibrillation Patient's heart rate is controlled on diltiazem 240 mg once daily. Blood pressure is stable. She did not tolerate beta blockers as she had wheezing on it. Continue current medicines.   Chronic combined systolic and diastolic heart failure Heart failure is compensated today. Continue Lasix 20 mg once daily. Check renal function today.   Hypertension Patient became hypotensive when she was not taking her medications properly  and took extra doses. Her daughter has  organized her medications to prevent this. Blood pressure is stable today.      Sumner Boast, PA-C  04/28/2014 12:13 PM    [facility] [address], [place], [place]  [address] Phone: [phone]; Fax: [phone]

## 2014-04-28 NOTE — Assessment & Plan Note (Signed)
Heart failure is compensated today. Continue Lasix 20 mg once daily. Check renal function today.

## 2014-04-28 NOTE — Assessment & Plan Note (Signed)
Patient became hypotensive when she was not taking her medications properly and took extra doses. Her daughter has organized her medications to prevent this. Blood pressure is stable today.

## 2014-05-08 ENCOUNTER — Telehealth: Payer: Self-pay | Admitting: *Deleted

## 2014-05-08 NOTE — Telephone Encounter (Signed)
F/U ° ° ° ° ° ° ° ° ° °Pt returning call. Please call back.  °

## 2014-05-08 NOTE — Telephone Encounter (Signed)
F/U      Pt returning call from today.    Please call.

## 2014-05-08 NOTE — Telephone Encounter (Signed)
-----   Message from Imogene Burn, PA-C sent at 04/30/2014  9:03 AM EST ----- No change. Repeat bmet in 2 weeks

## 2014-05-09 NOTE — Telephone Encounter (Signed)
Spoke with daughter to let her know that her mother needs to come in 2 weeks to get an BMET

## 2014-05-23 ENCOUNTER — Other Ambulatory Visit: Payer: Self-pay

## 2014-05-23 ENCOUNTER — Other Ambulatory Visit (INDEPENDENT_AMBULATORY_CARE_PROVIDER_SITE_OTHER): Payer: Medicare Other | Admitting: *Deleted

## 2014-05-23 DIAGNOSIS — I4891 Unspecified atrial fibrillation: Secondary | ICD-10-CM

## 2014-05-23 LAB — BASIC METABOLIC PANEL
BUN: 25 mg/dL — ABNORMAL HIGH (ref 6–23)
CALCIUM: 8.9 mg/dL (ref 8.4–10.5)
CHLORIDE: 105 meq/L (ref 96–112)
CO2: 31 meq/L (ref 19–32)
CREATININE: 1.25 mg/dL — AB (ref 0.40–1.20)
GFR: 42.93 mL/min — AB (ref >60.00)
GLUCOSE: 105 mg/dL — AB (ref 70–99)
POTASSIUM: 3.8 meq/L (ref 3.5–5.1)
Sodium: 139 mEq/L (ref 135–145)

## 2014-05-27 ENCOUNTER — Telehealth: Payer: Self-pay | Admitting: *Deleted

## 2014-05-27 NOTE — Telephone Encounter (Signed)
-----   Message from Imogene Burn, PA-C sent at 05/26/2014  7:47 AM EDT ----- Kidney function much better

## 2014-06-06 ENCOUNTER — Encounter: Payer: Self-pay | Admitting: Cardiovascular Disease

## 2014-06-06 ENCOUNTER — Ambulatory Visit (INDEPENDENT_AMBULATORY_CARE_PROVIDER_SITE_OTHER): Payer: Medicare Other | Admitting: Cardiovascular Disease

## 2014-06-06 VITALS — BP 130/70 | HR 96 | Ht 61.0 in | Wt 175.0 lb

## 2014-06-06 DIAGNOSIS — I350 Nonrheumatic aortic (valve) stenosis: Secondary | ICD-10-CM

## 2014-06-06 DIAGNOSIS — I481 Persistent atrial fibrillation: Secondary | ICD-10-CM

## 2014-06-06 DIAGNOSIS — N182 Chronic kidney disease, stage 2 (mild): Secondary | ICD-10-CM | POA: Diagnosis not present

## 2014-06-06 DIAGNOSIS — I5042 Chronic combined systolic (congestive) and diastolic (congestive) heart failure: Secondary | ICD-10-CM | POA: Diagnosis not present

## 2014-06-06 DIAGNOSIS — I4819 Other persistent atrial fibrillation: Secondary | ICD-10-CM

## 2014-06-06 NOTE — Progress Notes (Signed)
CC:  Chief Complaint  Patient presents with  . Back Pain    FOLLOW UP    History of Present Illness: 81 yo female with CAD, HTN, HLD, COPD, carotid artery disease s/p bilateral CEA, atrial fibrillation and chronic diastolic CHF who is her today for cardiac follow up. She had a prolonged hospital stay in June 2013 for Salmonella enteritis with septic shock. She had been on Xarelto for anticoagulation and amiodarone. She is s/p TEE-DCCV in July 2013, however, she reverted back to atrial fibrillation. Patient was readmitted to the hospital 10/2011 with recurrent CDiff colitis and required PRBCs x 1 for worsening anemia. Plan was to eventually proceed with DCCV again once she had recovered from acute issues. However, she was admitted again with pneumonia and again with upper GI bleeding. EGD was negative for source of bleeding but did demonstrate extensive esophageal candidiasis. She was treated with nystatin and eventually transitioned over to fluconazole. Gastroenterology felt that anticoagulation could be resumed. She began to have diarrhea and C. difficile PCR was positive. She was placed on oral vancomycin. Xarelto had been reduced to 15 mg QD due to low GFR while hospitalized for pneumonia. Seen in 01/2012 and she was in NSR. She was admitted 03/13/12-03/16/12 after falling and injuring her wrist and left hip. Patient had left hip fracture and left distal radius fracture. She underwent ORIF of her radius and pinning of her left femoral neck fracture. We saw her in the hospital for bradycardia. Given her advanced age and documented instability with falling, anticoagulation was discontinued. She did have some junctional bradycardia while sleeping. Amiodarone was continued and her Cardizem was cut back to 120 mg daily. We arranged a 21 event monitor after discharge. Her event monitor showed sinus rhythm with sinus bradycardia, rates 40s to 50s at times. No atrial fibrillation. We stopped her diltiazem. She was  seen in our office July 2015 by Richardson Dopp, PA-C with c/o chest pain. Stress myoview 09/26/13 with no ischemia. Echo 09/26/13 with normal LV function, mild MR. Admitted to [facility] December 2015 with atrial fib with RVR. She was started on Eliquis and amiodarone. Multiple follow up visits since then with Estella Husk, PA-C. Beta blocker was added but she did not tolerate due to wheezing. Cardizem was increased and beta blocker was stopped. Lasix was added for volume overload.   She is here today for follow up. No chest pain or SOB.  No syncope, orthopnea, PND, dizziness. Lower extremity swelling is controlled with Lasix. She has fatigue. No palpitations.   Primary Care Physician: Carlos Levering  Last Lipid Profile:Lipid Panel     Component Value Date/Time   CHOL 176 03/14/2012 0635   TRIG 94 03/14/2012 0635   HDL 76 03/14/2012 0635   CHOLHDL 2.3 03/14/2012 0635   VLDL 19 03/14/2012 0635   LDLCALC 81 03/14/2012 0635     Past Medical History  Diagnosis Date  . Hypertension   . Hyperlipidemia   . COPD (chronic obstructive pulmonary disease)   . Osteoporosis   . H/O hiatal hernia   . Chronic back pain   . CAD (coronary artery disease)     LHC 9/05:  pLAD less than 20%, D1 20-30%, ostial RCA 40-50%, proximal RCA 20%, EF 60%.  . C. difficile diarrhea   . Gout   . Atrial fibrillation     a. failed DCCV => b. amiodarone added => converted to NSR on Amiodarone;  c. Xarelto d/c'd 2/2 falling (wrist and hip Fx in  1/14)  . Carotid stenosis     s/p bilat CEA  . C. difficile colitis     10/2011;  11/2011  . Salmonella enteritis 08/2011    c/b septic shock  . Hx of echocardiogram     a. Echocardiogram 09/29/11: Mild LVH, EF 45-50%, diffuse HK, mild to moderate aortic stenosis, mean gradient 12 mmHg, moderate MAC, mild MR, moderate LAE, moderate RAE, question small secundum ASD with left to right flow not seen in 4 chamber view, PASP 35-39 (mild pulmonary hypertension). ;  b.  TEE on 10/04/11: Mild  LVH, EF 55%, mild MR, no defect or PFO  . Pneumonia 11/2101  . PONV (postoperative nausea and vomiting)   . CHF (congestive heart failure)   . Chronic kidney disease   . GERD (gastroesophageal reflux disease)   . Hepatitis   . Anemia   . Hx of echocardiogram     Echo (09/2013): EF 55%, normal wall motion, mild aortic stenosis (mean 12 mm Hg), MAC, mild MR, mild LAE, mild PI, PASP 30 mm Hg  . Hx of cardiovascular stress test     Lexiscan Myoview (7/15):  No ischemia or infarct, EF 50%, Low Risk  . Arthritis     Neck  . PAF (paroxysmal atrial fibrillation)     Past Surgical History  Procedure Laterality Date  . Carotid endarterectomy      bilateral  . Cataract extraction, bilateral    . Joint replacement  2008    Right Total Knee  . Anal fistula repair    . Left parotidectomy    . Eye surgery    . Flexible sigmoidoscopy  09/03/2011    Procedure: FLEXIBLE SIGMOIDOSCOPY;  Surgeon: Beryle Beams, MD;  Location: [facility];  Service: Endoscopy;  Laterality: N/A;  . Tee without cardioversion  10/04/2011    Procedure: TRANSESOPHAGEAL ECHOCARDIOGRAM (TEE);  Surgeon: Larey Dresser, MD;  Location: [facility];  Service: Cardiovascular;  Laterality: N/A;  to be carelinked here by 1230-verified 7/29/dl  . Cardioversion  10/04/2011    Procedure: CARDIOVERSION;  Surgeon: Larey Dresser, MD;  Location: [facility] END[facility]COPY;  Service: Cardiovascular;  Laterality: N/A;  . Esophagogastroduodenoscopy  11/29/2011    Procedure: ESOPHAGOGASTRODUODENOSCOPY (EGD);  Surgeon: Cleotis Nipper, MD;  Location: [facility];  Service: Endoscopy;  Laterality: N/A;  recent h/o resp failure/pneumonia  . Left hip fracture repair  03/14/12  . Open reduction internal fixation (orif) scaphoid with distal radius graft  03/14/2012    Procedure: OPEN REDUCTION INTERNAL FIXATION (ORIF) SCAPHOID WITH DISTAL RADIUS GRAFT;  Surgeon: Jolyn Nap, MD;  Location: [facility];  Service: Orthopedics;  Laterality: Left;  DVR wrist  fracture set/ hand innovation  . Hip pinning,cannulated  03/14/2012    Procedure: CANNULATED HIP PINNING;  Surgeon: Jolyn Nap, MD;  Location: [facility];  Service: Orthopedics;  Laterality: Left;  Biomet 6.5 cannulated screws  . Femur im nail Left 04/13/2012    Procedure: INTRAMEDULLARY (IM) NAIL FEMORAL;  Surgeon: Jolyn Nap, MD;  Location: [place];  Service: Orthopedics;  Laterality: Left;  OPEN REDUCTION INTERNAL FIXATION LEFT PROXIMAL FEMUR Fracture     Current Outpatient Prescriptions  Medication Sig Dispense Refill  . allopurinol (ZYLOPRIM) 100 MG tablet Take 100 mg by mouth every morning.     Marland Kitchen amiodarone (PACERONE) 200 MG tablet Take 1 tablet (200 mg total) by mouth daily. 90 tablet 4  . apixaban (ELIQUIS) 2.5 MG TABS tablet Take 1 tablet (2.5 mg total) by mouth  2 (two) times daily. 180 tablet 3  . atorvastatin (LIPITOR) 40 MG tablet Take 40 mg by mouth at bedtime.    . budesonide-formoterol (SYMBICORT) 160-4.5 MCG/ACT inhaler Inhale 2 puffs into the lungs 2 (two) times daily.    . cholecalciferol (VITAMIN D) 1000 UNITS tablet Take 1,000-2,000 Units by mouth 2 (two) times daily. Take 2000 units in the morning and 1000 units in the evening    . diltiazem (CARDIZEM CD) 120 MG 24 hr capsule Take 120 mg by mouth daily.   2  . ferrous sulfate 325 (65 FE) MG tablet Take 325 mg by mouth 2 (two) times daily with a meal.     . fish oil-omega-3 fatty acids 1000 MG capsule Take 1 g by mouth 2 (two) times daily.     . Flaxseed, Linseed, (FLAX SEED OIL PO) Take 1 capsule by mouth 2 (two) times daily.     . furosemide (LASIX) 20 MG tablet Take 20 mg by mouth every morning.     . levalbuterol (XOPENEX HFA) 45 MCG/ACT inhaler Inhale 2 puffs into the lungs every 6 (six) hours as needed for wheezing.     . meclizine (ANTIVERT) 25 MG tablet Take 1 tablet (25 mg total) by mouth 2 (two) times daily as needed for dizziness. 30 tablet 0  . montelukast (SINGULAIR) 10 MG tablet Take 10 mg by mouth at  bedtime.    . polyethylene glycol (MIRALAX / GLYCOLAX) packet Take 17 g by mouth daily as needed for mild constipation.    . raloxifene (EVISTA) 60 MG tablet Take 60 mg by mouth every morning.      No current facility-administered medications for this visit.    Allergies  Allergen Reactions  . Baby Powder [Methylbenzethonium] Shortness Of Breath  . Demerol [Meperidine] Hives  . Penicillins Hives  . Spiriva Handihaler [Tiotropium Bromide Monohydrate]     Caused chest pains    History   Social History  . Marital Status: Widowed    Spouse Name: N/A  . Number of Children: N/A  . Years of Education: N/A   Occupational History  . Not on file.   Social History Main Topics  . Smoking status: Former Smoker -- 1.00 packs/day for 20 years    Types: Cigarettes    Quit date: 09/01/1983  . Smokeless tobacco: Never Used  . Alcohol Use: No  . Drug Use: No  . Sexual Activity: No   Other Topics Concern  . Not on file   Social History Narrative    Family History  Problem Relation Age of Onset  . Hemochromatosis Sister   . Diabetes Sister   . Hypertension Sister   . Diabetes type II Mother   . Diabetes Mother   . Hypertension Mother   . Deep vein thrombosis Mother   . Hypertension Father   . Hypertension Brother   . Other Brother     Leg amputation-Gun shot  . Depression Brother   . Hemochromatosis Sister   . Heart attack Neg Hx   . Stroke Neg Hx   . Cancer Sister     Review of Systems:  As stated in the HPI and otherwise negative.   BP 130/70 mmHg  Pulse 96  Ht 5\' 1"  (1.549 m)  Wt 175 lb (79.379 kg)  BMI 33.08 kg/m2  SpO2 90%  Physical Examination: General: Well developed, well nourished, NAD HEENT: OP clear, mucus membranes moist SKIN: warm, dry. No rashes. Neuro: No focal deficits Musculoskeletal: Muscle strength  5/5 all ext Psychiatric: Mood and affect normal Neck: No JVD, no carotid bruits, no thyromegaly, no lymphadenopathy. Lungs:Clear bilaterally, no  wheezes, rhonci, crackles Cardiovascular: Irregular. Soft systolic murmur Abdomen:Soft. Bowel sounds present. Non-tender.  Extremities: Trace bilateral lower extremity edema. Pulses are 2 + in the bilateral DP/PT.  EKG:  EKG is ordered today. The ekg ordered today demonstrates   Recent Labs: 09/12/2013: Pro B Natriuretic peptide (BNP) 84.0 02/26/2014: B Natriuretic Peptide 292.3*; TSH 1.474 02/27/2014: ALT 12; Hemoglobin 9.9*; Magnesium 2.2; Platelets 189 05/23/2014: BUN 25*; Creatinine 1.25*; Potassium 3.8; Sodium 139   Lipid Panel    Component Value Date/Time   CHOL 176 03/14/2012 0635   TRIG 94 03/14/2012 0635   HDL 76 03/14/2012 0635   CHOLHDL 2.3 03/14/2012 0635   VLDL 19 03/14/2012 0635   LDLCALC 81 03/14/2012 0635     Wt Readings from Last 3 Encounters:  06/06/14 175 lb (79.379 kg)  04/28/14 172 lb (78.019 kg)  04/14/14 180 lb (81.647 kg)     Other studies Reviewed: Additional studies/ records that were reviewed today include: office notes, hospital records   Assessment and Plan:   1. Coronary Artery Disease: Stable. No angina. Continue aspirin and statin.    2. Atrial Fibrillation, persisent: She is in atrial fibrillation today. She remains on amiodarone and diltiazem. She is now on aspirin only. Anti-coagulation with Eliquis 2.5 mg po BID   3. Chronic Combined Systolic and Diastolic CHF: No lower ext edema. Continue Lasix 20 mg po Qdaily. Use extra 20 mg for swellling.   4. Chronic Kidney Disease: Managed by nephrology.   5. Aortic stenosis: Mild by echo June 2014.  Current medicines are reviewed at length with the patient today.  The patient does not have concerns regarding medicines.  The following changes have been made:  no change  Labs/ tests ordered today include: none  Orders Placed This Encounter  Procedures  . EKG 12-Lead    Disposition:   FU with me in 4 months   Signed, Lauree Chandler, MD 06/06/2014 12:35 PM    [facility] [address], [place], [place]  [address] Phone: [phone]; Fax: [phone]

## 2014-06-06 NOTE — Patient Instructions (Signed)
Your physician wants you to follow-up in: 3-4 months.   You will receive a reminder letter in the mail two months in advance. If you don't receive a letter, please call our office to schedule the follow-up appointment.  

## 2014-06-21 ENCOUNTER — Inpatient Hospital Stay (HOSPITAL_BASED_OUTPATIENT_CLINIC_OR_DEPARTMENT_OTHER)
Admission: EM | Admit: 2014-06-21 | Discharge: 2014-06-24 | DRG: 377 | Disposition: A | Discharge status: Home Health | Payer: Medicare Other | Attending: Internal Medicine | Admitting: Internal Medicine

## 2014-06-21 ENCOUNTER — Encounter (HOSPITAL_BASED_OUTPATIENT_CLINIC_OR_DEPARTMENT_OTHER): Payer: Self-pay | Admitting: *Deleted

## 2014-06-21 ENCOUNTER — Other Ambulatory Visit (HOSPITAL_BASED_OUTPATIENT_CLINIC_OR_DEPARTMENT_OTHER): Payer: Self-pay

## 2014-06-21 ENCOUNTER — Emergency Department (HOSPITAL_BASED_OUTPATIENT_CLINIC_OR_DEPARTMENT_OTHER): Payer: Medicare Other

## 2014-06-21 DIAGNOSIS — Z9981 Dependence on supplemental oxygen: Secondary | ICD-10-CM | POA: Diagnosis not present

## 2014-06-21 DIAGNOSIS — Z7982 Long term (current) use of aspirin: Secondary | ICD-10-CM | POA: Diagnosis not present

## 2014-06-21 DIAGNOSIS — Z8673 Personal history of transient ischemic attack (TIA), and cerebral infarction without residual deficits: Secondary | ICD-10-CM

## 2014-06-21 DIAGNOSIS — Z9842 Cataract extraction status, left eye: Secondary | ICD-10-CM

## 2014-06-21 DIAGNOSIS — E785 Hyperlipidemia, unspecified: Secondary | ICD-10-CM | POA: Diagnosis present

## 2014-06-21 DIAGNOSIS — Z87891 Personal history of nicotine dependence: Secondary | ICD-10-CM | POA: Diagnosis not present

## 2014-06-21 DIAGNOSIS — N189 Chronic kidney disease, unspecified: Secondary | ICD-10-CM | POA: Diagnosis present

## 2014-06-21 DIAGNOSIS — I482 Chronic atrial fibrillation: Secondary | ICD-10-CM | POA: Diagnosis present

## 2014-06-21 DIAGNOSIS — D649 Anemia, unspecified: Secondary | ICD-10-CM

## 2014-06-21 DIAGNOSIS — M109 Gout, unspecified: Secondary | ICD-10-CM | POA: Diagnosis present

## 2014-06-21 DIAGNOSIS — K2970 Gastritis, unspecified, without bleeding: Secondary | ICD-10-CM | POA: Diagnosis present

## 2014-06-21 DIAGNOSIS — Z79899 Other long term (current) drug therapy: Secondary | ICD-10-CM

## 2014-06-21 DIAGNOSIS — Z66 Do not resuscitate: Secondary | ICD-10-CM | POA: Diagnosis present

## 2014-06-21 DIAGNOSIS — N179 Acute kidney failure, unspecified: Secondary | ICD-10-CM | POA: Diagnosis present

## 2014-06-21 DIAGNOSIS — J449 Chronic obstructive pulmonary disease, unspecified: Secondary | ICD-10-CM | POA: Diagnosis present

## 2014-06-21 DIAGNOSIS — R0902 Hypoxemia: Secondary | ICD-10-CM

## 2014-06-21 DIAGNOSIS — I129 Hypertensive chronic kidney disease with stage 1 through stage 4 chronic kidney disease, or unspecified chronic kidney disease: Secondary | ICD-10-CM | POA: Diagnosis present

## 2014-06-21 DIAGNOSIS — Z7901 Long term (current) use of anticoagulants: Secondary | ICD-10-CM | POA: Diagnosis not present

## 2014-06-21 DIAGNOSIS — M549 Dorsalgia, unspecified: Secondary | ICD-10-CM | POA: Diagnosis present

## 2014-06-21 DIAGNOSIS — Z96651 Presence of right artificial knee joint: Secondary | ICD-10-CM | POA: Diagnosis present

## 2014-06-21 DIAGNOSIS — R531 Weakness: Secondary | ICD-10-CM | POA: Diagnosis present

## 2014-06-21 DIAGNOSIS — I481 Persistent atrial fibrillation: Secondary | ICD-10-CM

## 2014-06-21 DIAGNOSIS — I2510 Atherosclerotic heart disease of native coronary artery without angina pectoris: Secondary | ICD-10-CM | POA: Diagnosis present

## 2014-06-21 DIAGNOSIS — K922 Gastrointestinal hemorrhage, unspecified: Principal | ICD-10-CM

## 2014-06-21 DIAGNOSIS — J189 Pneumonia, unspecified organism: Secondary | ICD-10-CM

## 2014-06-21 DIAGNOSIS — M1990 Unspecified osteoarthritis, unspecified site: Secondary | ICD-10-CM | POA: Diagnosis present

## 2014-06-21 DIAGNOSIS — K219 Gastro-esophageal reflux disease without esophagitis: Secondary | ICD-10-CM | POA: Diagnosis present

## 2014-06-21 DIAGNOSIS — D62 Acute posthemorrhagic anemia: Secondary | ICD-10-CM | POA: Diagnosis present

## 2014-06-21 DIAGNOSIS — I5042 Chronic combined systolic (congestive) and diastolic (congestive) heart failure: Secondary | ICD-10-CM | POA: Diagnosis present

## 2014-06-21 DIAGNOSIS — Z9841 Cataract extraction status, right eye: Secondary | ICD-10-CM

## 2014-06-21 DIAGNOSIS — M810 Age-related osteoporosis without current pathological fracture: Secondary | ICD-10-CM | POA: Diagnosis present

## 2014-06-21 DIAGNOSIS — I4891 Unspecified atrial fibrillation: Secondary | ICD-10-CM | POA: Diagnosis not present

## 2014-06-21 DIAGNOSIS — G8929 Other chronic pain: Secondary | ICD-10-CM | POA: Diagnosis present

## 2014-06-21 LAB — BASIC METABOLIC PANEL
ANION GAP: 7 (ref 5–15)
BUN: 28 mg/dL — ABNORMAL HIGH (ref 6–23)
CO2: 30 mmol/L (ref 19–32)
Calcium: 8.9 mg/dL (ref 8.4–10.5)
Chloride: 101 mmol/L (ref 96–112)
Creatinine, Ser: 1.55 mg/dL — ABNORMAL HIGH (ref 0.50–1.10)
GFR calc non Af Amer: 29 mL/min — ABNORMAL LOW (ref >90)
GFR, EST AFRICAN AMERICAN: 33 mL/min — AB (ref >90)
Glucose, Bld: 115 mg/dL — ABNORMAL HIGH (ref 70–99)
POTASSIUM: 4.6 mmol/L (ref 3.5–5.1)
Sodium: 138 mmol/L (ref 135–145)

## 2014-06-21 LAB — CBC WITH DIFFERENTIAL/PLATELET
BASOS ABS: 0 10*3/uL (ref 0.0–0.1)
BASOS PCT: 0 % (ref 0–1)
EOS PCT: 2 % (ref 0–5)
Eosinophils Absolute: 0.1 10*3/uL (ref 0.0–0.7)
HCT: 26.8 % — ABNORMAL LOW (ref 36.0–46.0)
HEMOGLOBIN: 7.8 g/dL — AB (ref 12.0–15.0)
LYMPHS ABS: 0.7 10*3/uL (ref 0.7–4.0)
Lymphocytes Relative: 11 % — ABNORMAL LOW (ref 12–46)
MCH: 29.4 pg (ref 26.0–34.0)
MCHC: 29.1 g/dL — ABNORMAL LOW (ref 30.0–36.0)
MCV: 101.1 fL — AB (ref 78.0–100.0)
MONOS PCT: 7 % (ref 3–12)
Monocytes Absolute: 0.5 10*3/uL (ref 0.1–1.0)
NEUTROS ABS: 5.6 10*3/uL (ref 1.7–7.7)
Neutrophils Relative %: 80 % — ABNORMAL HIGH (ref 43–77)
PLATELETS: 267 10*3/uL (ref 150–400)
RBC: 2.65 MIL/uL — AB (ref 3.87–5.11)
RDW: 14.6 % (ref 11.5–15.5)
WBC: 7 10*3/uL (ref 4.0–10.5)

## 2014-06-21 LAB — CBC
HEMATOCRIT: 23.7 % — AB (ref 36.0–46.0)
HEMOGLOBIN: 7.2 g/dL — AB (ref 12.0–15.0)
MCH: 30.3 pg (ref 26.0–34.0)
MCHC: 30.4 g/dL (ref 30.0–36.0)
MCV: 99.6 fL (ref 78.0–100.0)
Platelets: 257 10*3/uL (ref 150–400)
RBC: 2.38 MIL/uL — AB (ref 3.87–5.11)
RDW: 14.9 % (ref 11.5–15.5)
WBC: 5.2 10*3/uL (ref 4.0–10.5)

## 2014-06-21 LAB — OCCULT BLOOD X 1 CARD TO LAB, STOOL: FECAL OCCULT BLD: POSITIVE — AB

## 2014-06-21 LAB — RETICULOCYTES
RBC.: 2.38 MIL/uL — ABNORMAL LOW (ref 3.87–5.11)
RETIC CT PCT: 4.9 % — AB (ref 0.4–3.1)
Retic Count, Absolute: 116.6 10*3/uL (ref 19.0–186.0)

## 2014-06-21 LAB — TROPONIN I: Troponin I: 0.03 ng/mL (ref <0.031)

## 2014-06-21 LAB — PREPARE RBC (CROSSMATCH)

## 2014-06-21 MED ORDER — SODIUM CHLORIDE 0.9 % IV SOLN
Freq: Once | INTRAVENOUS | Status: AC
  Administered 2014-06-22: 01:00:00 via INTRAVENOUS

## 2014-06-21 MED ORDER — ATORVASTATIN CALCIUM 40 MG PO TABS
40.0000 mg | ORAL_TABLET | Freq: Every day | ORAL | Status: DC
  Administered 2014-06-21 – 2014-06-23 (×3): 40 mg via ORAL
  Filled 2014-06-21 (×3): qty 1

## 2014-06-21 MED ORDER — POLYETHYLENE GLYCOL 3350 17 G PO PACK
17.0000 g | PACK | Freq: Every day | ORAL | Status: DC | PRN
  Administered 2014-06-24: 17 g via ORAL
  Filled 2014-06-21: qty 1

## 2014-06-21 MED ORDER — MONTELUKAST SODIUM 10 MG PO TABS
10.0000 mg | ORAL_TABLET | Freq: Every day | ORAL | Status: DC
  Administered 2014-06-21 – 2014-06-23 (×3): 10 mg via ORAL
  Filled 2014-06-21 (×3): qty 1

## 2014-06-21 MED ORDER — HYDROCODONE-ACETAMINOPHEN 5-325 MG PO TABS: 1.0000 | ORAL_TABLET | ORAL | Status: DC | PRN

## 2014-06-21 MED ORDER — BUDESONIDE-FORMOTEROL FUMARATE 160-4.5 MCG/ACT IN AERO
2.0000 | INHALATION_SPRAY | Freq: Two times a day (BID) | RESPIRATORY_TRACT | Status: DC
  Administered 2014-06-21 – 2014-06-24 (×6): 2 via RESPIRATORY_TRACT
  Filled 2014-06-21: qty 6

## 2014-06-21 MED ORDER — DIPHENHYDRAMINE HCL 25 MG PO CAPS
25.0000 mg | ORAL_CAPSULE | Freq: Once | ORAL | Status: AC
  Administered 2014-06-22: 25 mg via ORAL
  Filled 2014-06-21: qty 1

## 2014-06-21 MED ORDER — LEVALBUTEROL HCL 0.63 MG/3ML IN NEBU: 0.6300 mg | INHALATION_SOLUTION | Freq: Four times a day (QID) | RESPIRATORY_TRACT | Status: DC | PRN

## 2014-06-21 MED ORDER — ACETAMINOPHEN 325 MG PO TABS
650.0000 mg | ORAL_TABLET | Freq: Once | ORAL | Status: AC
  Administered 2014-06-22: 650 mg via ORAL
  Filled 2014-06-21: qty 2

## 2014-06-21 MED ORDER — ALLOPURINOL 100 MG PO TABS
100.0000 mg | ORAL_TABLET | Freq: Every morning | ORAL | Status: DC
  Administered 2014-06-22 – 2014-06-24 (×3): 100 mg via ORAL
  Filled 2014-06-21 (×3): qty 1

## 2014-06-21 MED ORDER — LEVALBUTEROL TARTRATE 45 MCG/ACT IN AERO: 2.0000 | INHALATION_SPRAY | Freq: Four times a day (QID) | RESPIRATORY_TRACT | Status: DC | PRN

## 2014-06-21 MED ORDER — ONDANSETRON HCL 4 MG/2ML IJ SOLN
4.0000 mg | Freq: Four times a day (QID) | INTRAMUSCULAR | Status: DC | PRN
Start: 2014-06-21 — End: 2014-06-24

## 2014-06-21 MED ORDER — PANTOPRAZOLE SODIUM 40 MG IV SOLR
40.0000 mg | Freq: Two times a day (BID) | INTRAVENOUS | Status: DC
  Administered 2014-06-21 – 2014-06-23 (×4): 40 mg via INTRAVENOUS
  Filled 2014-06-21 (×4): qty 40

## 2014-06-21 MED ORDER — FUROSEMIDE 20 MG PO TABS
20.0000 mg | ORAL_TABLET | Freq: Every morning | ORAL | Status: DC
  Administered 2014-06-22 – 2014-06-24 (×3): 20 mg via ORAL
  Filled 2014-06-21 (×3): qty 1

## 2014-06-21 MED ORDER — SODIUM CHLORIDE 0.9 % IJ SOLN
3.0000 mL | Freq: Two times a day (BID) | INTRAMUSCULAR | Status: DC
  Administered 2014-06-21 – 2014-06-24 (×5): 3 mL via INTRAVENOUS

## 2014-06-21 MED ORDER — ACETAMINOPHEN 650 MG RE SUPP: 650.0000 mg | Freq: Four times a day (QID) | RECTAL | Status: DC | PRN

## 2014-06-21 MED ORDER — SODIUM CHLORIDE 0.9 % IJ SOLN: 3.0000 mL | INTRAMUSCULAR | Status: DC | PRN

## 2014-06-21 MED ORDER — SODIUM CHLORIDE 0.9 % IV SOLN: 250.0000 mL | INTRAVENOUS | Status: DC | PRN

## 2014-06-21 MED ORDER — LEVOFLOXACIN IN D5W 750 MG/150ML IV SOLN
750.0000 mg | INTRAVENOUS | Status: DC
  Administered 2014-06-23: 750 mg via INTRAVENOUS
  Filled 2014-06-21 (×2): qty 150

## 2014-06-21 MED ORDER — ONDANSETRON HCL 4 MG PO TABS: 4.0000 mg | ORAL_TABLET | Freq: Four times a day (QID) | ORAL | Status: DC | PRN

## 2014-06-21 MED ORDER — AMIODARONE HCL 200 MG PO TABS
200.0000 mg | ORAL_TABLET | Freq: Every day | ORAL | Status: DC
  Administered 2014-06-21 – 2014-06-24 (×4): 200 mg via ORAL
  Filled 2014-06-21 (×4): qty 1

## 2014-06-21 MED ORDER — DILTIAZEM HCL ER 60 MG PO CP12
120.0000 mg | ORAL_CAPSULE | Freq: Two times a day (BID) | ORAL | Status: DC
  Administered 2014-06-21 – 2014-06-24 (×5): 120 mg via ORAL
  Filled 2014-06-21 (×8): qty 2

## 2014-06-21 MED ORDER — ACETAMINOPHEN 325 MG PO TABS: 650.0000 mg | ORAL_TABLET | Freq: Four times a day (QID) | ORAL | Status: DC | PRN

## 2014-06-21 MED ORDER — SODIUM CHLORIDE 0.9 % IJ SOLN
3.0000 mL | Freq: Two times a day (BID) | INTRAMUSCULAR | Status: DC
  Administered 2014-06-23 (×2): 3 mL via INTRAVENOUS

## 2014-06-21 MED ORDER — RALOXIFENE HCL 60 MG PO TABS
60.0000 mg | ORAL_TABLET | Freq: Every morning | ORAL | Status: DC
  Administered 2014-06-22 – 2014-06-24 (×3): 60 mg via ORAL
  Filled 2014-06-21 (×3): qty 1

## 2014-06-21 MED ORDER — LEVOFLOXACIN IN D5W 750 MG/150ML IV SOLN
750.0000 mg | Freq: Once | INTRAVENOUS | Status: AC
  Administered 2014-06-21: 750 mg via INTRAVENOUS
  Filled 2014-06-21: qty 150

## 2014-06-21 NOTE — ED Notes (Signed)
Patient maintained NPO

## 2014-06-21 NOTE — ED Provider Notes (Signed)
CSN: [id number]     Arrival date & time 06/21/14  1357 History   First MD Initiated Contact with Patient 06/21/14 1459     Chief Complaint  Patient presents with  . Dizziness     (Consider location/radiation/quality/duration/timing/severity/associated sxs/prior Treatment) HPI Comments: Patient is an 81 year old female with a past medical history of paroxysmal atrial fibrillation, COPD, CAD, hypertension, CKD, and CHF who presents with generalized weakness and dizziness for the past 2 weeks that acutely worsened today. She describes the dizziness as lightheadedness. Symptoms started gradually and progressively worsened since the onset. The weakness is generalized and is most notable on exertion. She reports some SOB on exertion. Patient takes Eliquis for atrial fibrillation. She denies any bloody stool and states her stool is always dark because she takes iron supplements. She wears nasal cannula oxygen only at night. Patient saw her PCP yesterday and her hemoglobin was 8.1. She was told if her symptoms worsen to go to the ED. No alleviating factors. No other associated symptoms.    Past Medical History  Diagnosis Date  . Hypertension   . Hyperlipidemia   . COPD (chronic obstructive pulmonary disease)   . Osteoporosis   . H/O hiatal hernia   . Chronic back pain   . CAD (coronary artery disease)     LHC 9/05:  pLAD less than 20%, D1 20-30%, ostial RCA 40-50%, proximal RCA 20%, EF 60%.  . C. difficile diarrhea   . Gout   . Atrial fibrillation     a. failed DCCV => b. amiodarone added => converted to NSR on Amiodarone;  c. Xarelto d/c'd 2/2 falling (wrist and hip Fx in 1/14)  . Carotid stenosis     s/p bilat CEA  . C. difficile colitis     10/2011;  11/2011  . Salmonella enteritis 08/2011    c/b septic shock  . Hx of echocardiogram     a. Echocardiogram 09/29/11: Mild LVH, EF 45-50%, diffuse HK, mild to moderate aortic stenosis, mean gradient 12 mmHg, moderate MAC, mild MR, moderate LAE,  moderate RAE, question small secundum ASD with left to right flow not seen in 4 chamber view, PASP 35-39 (mild pulmonary hypertension). ;  b.  TEE on 10/04/11: Mild LVH, EF 55%, mild MR, no defect or PFO  . Pneumonia 11/2101  . PONV (postoperative nausea and vomiting)   . CHF (congestive heart failure)   . Chronic kidney disease   . GERD (gastroesophageal reflux disease)   . Hepatitis   . Anemia   . Hx of echocardiogram     Echo (09/2013): EF 55%, normal wall motion, mild aortic stenosis (mean 12 mm Hg), MAC, mild MR, mild LAE, mild PI, PASP 30 mm Hg  . Hx of cardiovascular stress test     Lexiscan Myoview (7/15):  No ischemia or infarct, EF 50%, Low Risk  . Arthritis     Neck  . PAF (paroxysmal atrial fibrillation)    Past Surgical History  Procedure Laterality Date  . Carotid endarterectomy      bilateral  . Cataract extraction, bilateral    . Joint replacement  2008    Right Total Knee  . Anal fistula repair    . Left parotidectomy    . Eye surgery    . Flexible sigmoidoscopy  09/03/2011    Procedure: FLEXIBLE SIGMOIDOSCOPY;  Surgeon: Beryle Beams, MD;  Location: [facility];  Service: Endoscopy;  Laterality: N/A;  . Tee without cardioversion  10/04/2011  Procedure: TRANSESOPHAGEAL ECHOCARDIOGRAM (TEE);  Surgeon: Larey Dresser, MD;  Location: [place];  Service: Cardiovascular;  Laterality: N/A;  to be carelinked here by 1230-verified 7/29/dl  . Cardioversion  10/04/2011    Procedure: CARDIOVERSION;  Surgeon: Larey Dresser, MD;  Location: [facility];  Service: Cardiovascular;  Laterality: N/A;  . Esophagogastroduodenoscopy  11/29/2011    Procedure: ESOPHAGOGASTRODUODENOSCOPY (EGD);  Surgeon: Cleotis Nipper, MD;  Location: [facility];  Service: Endoscopy;  Laterality: N/A;  recent h/o resp failure/pneumonia  . Left hip fracture repair  03/14/12  . Open reduction internal fixation (orif) scaphoid with distal radius graft  03/14/2012    Procedure: OPEN REDUCTION INTERNAL  FIXATION (ORIF) SCAPHOID WITH DISTAL RADIUS GRAFT;  Surgeon: Jolyn Nap, MD;  Location: [facility];  Service: Orthopedics;  Laterality: Left;  DVR wrist fracture set/ hand innovation  . Hip pinning,cannulated  03/14/2012    Procedure: CANNULATED HIP PINNING;  Surgeon: Jolyn Nap, MD;  Location: [facility];  Service: Orthopedics;  Laterality: Left;  Biomet 6.5 cannulated screws  . Femur im nail Left 04/13/2012    Procedure: INTRAMEDULLARY (IM) NAIL FEMORAL;  Surgeon: Jolyn Nap, MD;  Location: [place];  Service: Orthopedics;  Laterality: Left;  OPEN REDUCTION INTERNAL FIXATION LEFT PROXIMAL FEMUR Fracture    Family History  Problem Relation Age of Onset  . Hemochromatosis Sister   . Diabetes Sister   . Hypertension Sister   . Diabetes type II Mother   . Diabetes Mother   . Hypertension Mother   . Deep vein thrombosis Mother   . Hypertension Father   . Hypertension Brother   . Other Brother     Leg amputation-Gun shot  . Depression Brother   . Hemochromatosis Sister   . Heart attack Neg Hx   . Stroke Neg Hx   . Cancer Sister    History  Substance Use Topics  . Smoking status: Former Smoker -- 1.00 packs/day for 20 years    Types: Cigarettes    Quit date: 09/01/1983  . Smokeless tobacco: Never Used  . Alcohol Use: No   OB History    No data available     Review of Systems  Constitutional: Negative for fever, chills and fatigue.  HENT: Negative for trouble swallowing.   Eyes: Negative for visual disturbance.  Respiratory: Negative for shortness of breath.   Cardiovascular: Negative for chest pain and palpitations.  Gastrointestinal: Negative for nausea, vomiting, abdominal pain and diarrhea.  Genitourinary: Negative for dysuria and difficulty urinating.  Musculoskeletal: Negative for arthralgias and neck pain.  Skin: Negative for color change.  Neurological: Positive for weakness and light-headedness. Negative for dizziness.  Psychiatric/Behavioral: Negative for  dysphoric mood.      Allergies  Baby powder; Demerol; Penicillins; and Spiriva handihaler  Home Medications   Prior to Admission medications   Medication Sig Start Date End Date Taking? Authorizing Provider  allopurinol (ZYLOPRIM) 100 MG tablet Take 100 mg by mouth every morning.     Historical Provider, MD  amiodarone (PACERONE) 200 MG tablet Take 1 tablet (200 mg total) by mouth daily. 04/07/14   Erlene Quan, PA-C  apixaban (ELIQUIS) 2.5 MG TABS tablet Take 1 tablet (2.5 mg total) by mouth 2 (two) times daily. 04/14/14   Burnell Blanks, MD  atorvastatin (LIPITOR) 40 MG tablet Take 40 mg by mouth at bedtime.    Historical Provider, MD  budesonide-formoterol (SYMBICORT) 160-4.5 MCG/ACT inhaler Inhale 2 puffs into the lungs 2 (two) times daily.  Historical Provider, MD  cholecalciferol (VITAMIN D) 1000 UNITS tablet Take 1,000-2,000 Units by mouth 2 (two) times daily. Take 2000 units in the morning and 1000 units in the evening    Historical Provider, MD  diltiazem (CARDIZEM CD) 120 MG 24 hr capsule Take 120 mg by mouth daily.  05/18/14   Historical Provider, MD  ferrous sulfate 325 (65 FE) MG tablet Take 325 mg by mouth 2 (two) times daily with a meal.     Historical Provider, MD  fish oil-omega-3 fatty acids 1000 MG capsule Take 1 g by mouth 2 (two) times daily.     Historical Provider, MD  Flaxseed, Linseed, (FLAX SEED OIL PO) Take 1 capsule by mouth 2 (two) times daily.     Historical Provider, MD  furosemide (LASIX) 20 MG tablet Take 20 mg by mouth every morning.     Historical Provider, MD  levalbuterol [facility] HFA) 45 MCG/ACT inhaler Inhale 2 puffs into the lungs every 6 (six) hours as needed for wheezing.     Historical Provider, MD  meclizine (ANTIVERT) 25 MG tablet Take 1 tablet (25 mg total) by mouth 2 (two) times daily as needed for dizziness. 07/06/13   Merryl Hacker, MD  montelukast (SINGULAIR) 10 MG tablet Take 10 mg by mouth at bedtime.    Historical Provider, MD   polyethylene glycol (MIRALAX / GLYCOLAX) packet Take 17 g by mouth daily as needed for mild constipation.    Historical Provider, MD  raloxifene (EVISTA) 60 MG tablet Take 60 mg by mouth every morning.     Historical Provider, MD   BP 101/50 mmHg  Pulse 88  Temp(Src) 98.8 F (37.1 C) (Oral)  Resp 19  Ht 5\' 1"  (1.549 m)  Wt 176 lb (79.833 kg)  BMI 33.27 kg/m2  SpO2 98% Physical Exam  Constitutional: She is oriented to person, place, and time. She appears well-developed and well-nourished. No distress.  HENT:  Head: Normocephalic and atraumatic.  Eyes: Conjunctivae and EOM are normal.  Neck: Normal range of motion.  Cardiovascular: Normal rate and regular rhythm.  Exam reveals no gallop and no friction rub.   No murmur heard. Pulmonary/Chest: Effort normal and breath sounds normal. She has no wheezes. She has no rales. She exhibits no tenderness.  Patient on 2L oxygen nasal cannula.  Abdominal: Soft. She exhibits no distension. There is no tenderness. There is no rebound.  Musculoskeletal: Normal range of motion.  Neurological: She is alert and oriented to person, place, and time. Coordination normal.  Speech is goal-oriented. Moves limbs without ataxia.   Skin: Skin is warm and dry.  Psychiatric: She has a normal mood and affect. Her behavior is normal.  Nursing note and vitals reviewed.   ED Course  Procedures (including critical care time) Labs Review Labs Reviewed  CBC WITH DIFFERENTIAL/PLATELET - Abnormal; Notable for the following:    RBC 2.65 (*)    Hemoglobin 7.8 (*)    HCT 26.8 (*)    MCV 101.1 (*)    MCHC 29.1 (*)    Neutrophils Relative % 80 (*)    Lymphocytes Relative 11 (*)    All other components within normal limits  BASIC METABOLIC PANEL - Abnormal; Notable for the following:    Glucose, Bld 115 (*)    BUN 28 (*)    Creatinine, Ser 1.55 (*)    GFR calc non Af Amer 29 (*)    GFR calc Af Amer 33 (*)    All other components  within normal limits  OCCULT  BLOOD X 1 CARD TO LAB, STOOL - Abnormal; Notable for the following:    Fecal Occult Bld POSITIVE (*)    All other components within normal limits  TROPONIN I    Imaging Review Dg Chest 2 View  06/21/2014   CLINICAL DATA:  Weakness and shortness of breath for the past 3 weeks.  EXAM: CHEST  2 VIEW  COMPARISON:  02/26/2014.  FINDINGS: Stable enlarged cardiac silhouette. Interval patchy airspace opacity in the right upper lobe. Interval minimal bilateral pleural fluid. The pulmonary vasculature and interstitial markings remain prominent. Right neck surgical clips are again demonstrated. Thoracic spine degenerative changes. Atheromatous arterial calcifications.  IMPRESSION: 1. Right upper lobe pneumonia. 2. Minimal bilateral pleural effusions. 3. Stable cardiomegaly, pulmonary vascular congestion and chronic interstitial lung disease.   Electronically Signed   By: Claudie Revering M.D.   On: 06/21/2014 16:28     EKG Interpretation   Date/Time:  Saturday June 21 2014 14:17:28 EDT Ventricular Rate:  86 PR Interval:    QRS Duration: 90 QT Interval:  364 QTC Calculation: 435 R Axis:   19 Text Interpretation:  Atrial fibrillation with premature ventricular or  aberrantly conducted complexes Nonspecific ST and T wave abnormality  Abnormal ECG Prior with afib with RVR Confirmed by DOCHERTY  MD, MEGAN  ([id number]) on 06/21/2014 2:22:04 PM      MDM   Final diagnoses:  Oxygen dependent  Gastrointestinal hemorrhage, unspecified gastritis, unspecified gastrointestinal hemorrhage type  CAP (community acquired pneumonia)  Symptomatic anemia    5:00 PM Patient's hemoglobin 7.8 which is down from 8.1 yesterday. Patient has a positive fecal occult stool. Patient also has CAP on chest xray. Patient oxygen dependent on 2L oxygen at rest which is not baseline for her. Patient started on Levaquin for CAP. Patient will have type and screen and blood transfusion at [facility] due to no capability at [facility].    5:29 PM Dr. Fuller Plan will not accept the patient due to her seeing Dr. Cristina Gong in the hospital on admission 3 years ago. I will call [facility] on call physician.   5:35 PM Patient admitted to Dr. Waldron [facility] at [facility].   5:42 PM I spoke with Dr. Oletta Lamas who will see the patient in the hospital. Patient will be transferred to [facility].    Alvina Chou, PA-C 06/21/14 2316  Alfonzo Beers, MD 06/21/14 [phone]

## 2014-06-21 NOTE — Progress Notes (Addendum)
ANTIBIOTIC CONSULT NOTE - INITIAL  Pharmacy Consult for levofloxacin Indication: CAP  Allergies  Allergen Reactions  . Baby Powder [Methylbenzethonium] Shortness Of Breath  . Demerol [Meperidine] Hives  . Penicillins Hives  . Spiriva Handihaler [Tiotropium Bromide Monohydrate]     Caused chest pains    Patient Measurements: Height: 5\' 1"  (154.9 cm) Weight: 168 lb 10.4 oz (76.5 kg) IBW/kg (Calculated) : 47.8 Adjusted Body Weight:   Vital Signs: Temp: 98.2 F (36.8 C) (04/16 2059) Temp Source: Oral (04/16 1406) BP: 125/54 mmHg (04/16 2059) Pulse Rate: 97 (04/16 2059) Intake/Output from previous day:   Intake/Output from this shift:    Labs:  Recent Labs  06/21/14 1440 06/21/14 2214  WBC 7.0 5.2  HGB 7.8* 7.2*  PLT 267 257  CREATININE 1.55*  --    Estimated Creatinine Clearance: 23.5 mL/min (by C-G formula based on Cr of 1.55). No results for input(s): VANCOTROUGH, VANCOPEAK, VANCORANDOM, GENTTROUGH, GENTPEAK, GENTRANDOM, TOBRATROUGH, TOBRAPEAK, TOBRARND, AMIKACINPEAK, AMIKACINTROU, AMIKACIN in the last 72 hours.   Microbiology: No results found for this or any previous visit (from the past 720 hour(s)).  Medical History: Past Medical History  Diagnosis Date  . Hypertension   . Hyperlipidemia   . COPD (chronic obstructive pulmonary disease)   . Osteoporosis   . H/O hiatal hernia   . Chronic back pain   . CAD (coronary artery disease)     LHC 9/05:  pLAD less than 20%, D1 20-30%, ostial RCA 40-50%, proximal RCA 20%, EF 60%.  . C. difficile diarrhea   . Gout   . Atrial fibrillation     a. failed DCCV => b. amiodarone added => converted to NSR on Amiodarone;  c. Xarelto d/c'd 2/2 falling (wrist and hip Fx in 1/14)  . Carotid stenosis     s/p bilat CEA  . C. difficile colitis     10/2011;  11/2011  . Salmonella enteritis 08/2011    c/b septic shock  . Hx of echocardiogram     a. Echocardiogram 09/29/11: Mild LVH, EF 45-50%, diffuse HK, mild to moderate  aortic stenosis, mean gradient 12 mmHg, moderate MAC, mild MR, moderate LAE, moderate RAE, question small secundum ASD with left to right flow not seen in 4 chamber view, PASP 35-39 (mild pulmonary hypertension). ;  b.  TEE on 10/04/11: Mild LVH, EF 55%, mild MR, no defect or PFO  . Pneumonia 11/2101  . PONV (postoperative nausea and vomiting)   . CHF (congestive heart failure)   . Chronic kidney disease   . GERD (gastroesophageal reflux disease)   . Hepatitis   . Anemia   . Hx of echocardiogram     Echo (09/2013): EF 55%, normal wall motion, mild aortic stenosis (mean 12 mm Hg), MAC, mild MR, mild LAE, mild PI, PASP 30 mm Hg  . Hx of cardiovascular stress test     Lexiscan Myoview (7/15):  No ischemia or infarct, EF 50%, Low Risk  . Arthritis     Neck  . PAF (paroxysmal atrial fibrillation)     Medications:  Anti-infectives    Start     Dose/Rate Route Frequency Ordered Stop   06/23/14 1200  levofloxacin (LEVAQUIN) IVPB 750 mg     750 mg 100 mL/hr over 90 Minutes Intravenous Every 48 hours 06/21/14 2305     06/21/14 1715  levofloxacin (LEVAQUIN) IVPB 750 mg     750 mg 100 mL/hr over 90 Minutes Intravenous  Once 06/21/14 1704 06/21/14 1847  Assessment: Patient with CAP on chest xray.  Patient with poor renal function as well.  First dose of antibiotics already given.  Goal of Therapy:  Levofloxacin dosed based on patient weight and renal function  Plan:  Follow up culture results  Levofloxacin 750mg  iv q48hr    Nani Skillern Crowford 06/21/2014,11:24 PM

## 2014-06-21 NOTE — ED Notes (Signed)
Patient had black stool upon obtaining stool for occult blood.  Patient reports this is not abnormal for her due to taking iron daily.

## 2014-06-21 NOTE — H&P (Signed)
PCP:  Carlos Levering, PA-C  Cardiology [facility]  Chief Complaint:   lightheaded  HPI: [name] [name] is a 81 y.o. female   has a past medical history of Hypertension; Hyperlipidemia; COPD (chronic obstructive pulmonary disease); Osteoporosis; H/O hiatal hernia; Chronic back pain; CAD (coronary artery disease); C. difficile diarrhea; Gout; Atrial fibrillation; Carotid stenosis; C. difficile colitis; Salmonella enteritis (08/2011); echocardiogram; Pneumonia (11/2101); PONV (postoperative nausea and vomiting); CHF (congestive heart failure); Chronic kidney disease; GERD (gastroesophageal reflux disease); Hepatitis; Anemia; echocardiogram; cardiovascular stress test; Arthritis; and PAF (paroxysmal atrial fibrillation).   Patient is known history of coronary artery disease, COPD and arterial fibrillation on chronic anticoagulation with currently with Eliquis and daily aspirin. Patient reports feeling lightheaded and weak for the past 2 days she presented that this complaints to her PCP last week. At that point blood work was done showing hemoglobin 8.1 from a baseline of 10. Patient did not endorse any chest pain but does endorse shortness of breath and lightheadedness which has gotten worse today. Patient went to [place]. Patient did not endorse any blood in stool but have reported some dark stools which she attributed to iron. Deneis any frank blood in her stool. She reports no pain but has a "funny feeling" in her epigastric region.  On physical exam she was found to be Hemoccult positive. Emergency physician spoke to Dr. Oletta Lamas with [facility] who will see patient in consult at [facility]. Of note her chest x-ray also showed evidence of pneumonia patient has no elevated white blood cell count denies any fevers. She reports productive cough.  In emergency department patient was started on Levaquin. Plan to transfer to [facility] for transfusion and GI consult  Patient has  not tolerated beta blockers in the past due to wheezing. She wears 2 L of oxygen at nighttime but is currently requiring 2 L at baseline. Patient has had a number of admissions for GI bleeding in the past. She has been seen by [facility] At some point her EGD showed esophageal candidiasis which was successfully treated with fluconazole. Last got endoscopy apparently was in 2013 done that by Dr. Cristina Gong.  Hospitalist was called for transfer to [facility] for GI bleeding, symptomatic anemia, query acquired pneumonia  Review of Systems:    Pertinent positives include:  fatigue, dark stools, shortness of breath at rest. dyspnea on exertion,  Constitutional:  No weight loss, night sweats, Fevers, chills, weight loss  HEENT:  No headaches, Difficulty swallowing,Tooth/dental problems,Sore throat,  No sneezing, itching, ear ache, nasal congestion, post nasal drip,  Cardio-vascular:  No chest pain, Orthopnea, PND, anasarca, dizziness, palpitations.no Bilateral lower extremity swelling  GI:  No heartburn, indigestion, abdominal pain, nausea, vomiting, diarrhea, change in bowel habits, loss of appetite, melena, blood in stool, hematemesis Resp:  no  No No excess mucus, no productive cough, No non-productive cough, No coughing up of blood.No change in color of mucus.No wheezing. Skin:  no rash or lesions. No jaundice GU:  no dysuria, change in color of urine, no urgency or frequency. No straining to urinate.  No flank pain.  Musculoskeletal:  No joint pain or no joint swelling. No decreased range of motion. No back pain.  Psych:  No change in mood or affect. No depression or anxiety. No memory loss.  Neuro: no localizing neurological complaints, no tingling, no weakness, no double vision, no gait abnormality, no slurred speech, no confusion  Otherwise ROS are negative except for above, 10 systems  were reviewed  Past Medical History: Past Medical History  Diagnosis Date  . Hypertension   .  Hyperlipidemia   . COPD (chronic obstructive pulmonary disease)   . Osteoporosis   . H/O hiatal hernia   . Chronic back pain   . CAD (coronary artery disease)     LHC 9/05:  pLAD less than 20%, D1 20-30%, ostial RCA 40-50%, proximal RCA 20%, EF 60%.  . C. difficile diarrhea   . Gout   . Atrial fibrillation     a. failed DCCV => b. amiodarone added => converted to NSR on Amiodarone;  c. Xarelto d/c'd 2/2 falling (wrist and hip Fx in 1/14)  . Carotid stenosis     s/p bilat CEA  . C. difficile colitis     10/2011;  11/2011  . Salmonella enteritis 08/2011    c/b septic shock  . Hx of echocardiogram     a. Echocardiogram 09/29/11: Mild LVH, EF 45-50%, diffuse HK, mild to moderate aortic stenosis, mean gradient 12 mmHg, moderate MAC, mild MR, moderate LAE, moderate RAE, question small secundum ASD with left to right flow not seen in 4 chamber view, PASP 35-39 (mild pulmonary hypertension). ;  b.  TEE on 10/04/11: Mild LVH, EF 55%, mild MR, no defect or PFO  . Pneumonia 11/2101  . PONV (postoperative nausea and vomiting)   . CHF (congestive heart failure)   . Chronic kidney disease   . GERD (gastroesophageal reflux disease)   . Hepatitis   . Anemia   . Hx of echocardiogram     Echo (09/2013): EF 55%, normal wall motion, mild aortic stenosis (mean 12 mm Hg), MAC, mild MR, mild LAE, mild PI, PASP 30 mm Hg  . Hx of cardiovascular stress test     Lexiscan Myoview (7/15):  No ischemia or infarct, EF 50%, Low Risk  . Arthritis     Neck  . PAF (paroxysmal atrial fibrillation)    Past Surgical History  Procedure Laterality Date  . Carotid endarterectomy      bilateral  . Cataract extraction, bilateral    . Joint replacement  2008    Right Total Knee  . Anal fistula repair    . Left parotidectomy    . Eye surgery    . Flexible sigmoidoscopy  09/03/2011    Procedure: FLEXIBLE SIGMOIDOSCOPY;  Surgeon: Beryle Beams, MD;  Location: [facility] ENDOSCOPY;  Service: Endoscopy;  Laterality: N/A;  . Tee  without cardioversion  10/04/2011    Procedure: TRANSESOPHAGEAL ECHOCARDIOGRAM (TEE);  Surgeon: Larey Dresser, MD;  Location: [place];  Service: Cardiovascular;  Laterality: N/A;  to be carelinked here by 1230-verified 7/29/dl  . Cardioversion  10/04/2011    Procedure: CARDIOVERSION;  Surgeon: Larey Dresser, MD;  Location: [facility] ENDOSCOPY;  Service: Cardiovascular;  Laterality: N/A;  . Esophagogastroduodenoscopy  11/29/2011    Procedure: ESOPHAGOGASTRODUODENOSCOPY (EGD);  Surgeon: Cleotis Nipper, MD;  Location: [facility];  Service: Endoscopy;  Laterality: N/A;  recent h/o resp failure/pneumonia  . Left hip fracture repair  03/14/12  . Open reduction internal fixation (orif) scaphoid with distal radius graft  03/14/2012    Procedure: OPEN REDUCTION INTERNAL FIXATION (ORIF) SCAPHOID WITH DISTAL RADIUS GRAFT;  Surgeon: Jolyn Nap, MD;  Location: [facility] ORS;  Service: Orthopedics;  Laterality: Left;  DVR wrist fracture set/ hand innovation  . Hip pinning,cannulated  03/14/2012    Procedure: CANNULATED HIP PINNING;  Surgeon: Jolyn Nap, MD;  Location: [facility];  Service: Orthopedics;  Laterality: Left;  Biomet 6.5 cannulated screws  . Femur im nail Left 04/13/2012    Procedure: INTRAMEDULLARY (IM) NAIL FEMORAL;  Surgeon: Jolyn Nap, MD;  Location: [place];  Service: Orthopedics;  Laterality: Left;  OPEN REDUCTION INTERNAL FIXATION LEFT PROXIMAL FEMUR Fracture      Medications: Prior to Admission medications   Medication Sig Start Date End Date Taking? Authorizing Provider  allopurinol (ZYLOPRIM) 100 MG tablet Take 100 mg by mouth every morning.     Historical Provider, MD  amiodarone (PACERONE) 200 MG tablet Take 1 tablet (200 mg total) by mouth daily. 04/07/14   Erlene Quan, PA-C  apixaban (ELIQUIS) 2.5 MG TABS tablet Take 1 tablet (2.5 mg total) by mouth 2 (two) times daily. 04/14/14   Burnell Blanks, MD  atorvastatin (LIPITOR) 40 MG tablet Take 40 mg by mouth at bedtime.     Historical Provider, MD  budesonide-formoterol (SYMBICORT) 160-4.5 MCG/ACT inhaler Inhale 2 puffs into the lungs 2 (two) times daily.    Historical Provider, MD  cholecalciferol (VITAMIN D) 1000 UNITS tablet Take 1,000-2,000 Units by mouth 2 (two) times daily. Take 2000 units in the morning and 1000 units in the evening    Historical Provider, MD  diltiazem (CARDIZEM CD) 120 MG 24 hr capsule Take 120 mg by mouth daily.  05/18/14   Historical Provider, MD  ferrous sulfate 325 (65 FE) MG tablet Take 325 mg by mouth 2 (two) times daily with a meal.     Historical Provider, MD  fish oil-omega-3 fatty acids 1000 MG capsule Take 1 g by mouth 2 (two) times daily.     Historical Provider, MD  Flaxseed, Linseed, (FLAX SEED OIL PO) Take 1 capsule by mouth 2 (two) times daily.     Historical Provider, MD  furosemide (LASIX) 20 MG tablet Take 20 mg by mouth every morning.     Historical Provider, MD  levalbuterol [facility] HFA) 45 MCG/ACT inhaler Inhale 2 puffs into the lungs every 6 (six) hours as needed for wheezing.     Historical Provider, MD  meclizine (ANTIVERT) 25 MG tablet Take 1 tablet (25 mg total) by mouth 2 (two) times daily as needed for dizziness. 07/06/13   Merryl Hacker, MD  montelukast (SINGULAIR) 10 MG tablet Take 10 mg by mouth at bedtime.    Historical Provider, MD  polyethylene glycol (MIRALAX / GLYCOLAX) packet Take 17 g by mouth daily as needed for mild constipation.    Historical Provider, MD  raloxifene (EVISTA) 60 MG tablet Take 60 mg by mouth every morning.     Historical Provider, MD    Allergies:   Allergies  Allergen Reactions  . Baby Powder [Methylbenzethonium] Shortness Of Breath  . Demerol [Meperidine] Hives  . Penicillins Hives  . Spiriva Handihaler [Tiotropium Bromide Monohydrate]     Caused chest pains    Social History:  Ambulatory walker  Or cane,  Lives at [facility] ,     reports that she quit smoking about 30 years ago. Her smoking use  included Cigarettes. She has a 20 pack-year smoking history. She has never used smokeless tobacco. She reports that she does not drink alcohol or use illicit drugs.    Family History: family history includes Cancer in her sister; Deep vein thrombosis in her mother; Depression in her brother; Diabetes in her mother and sister; Diabetes type II in her mother; Hemochromatosis in her sister and sister; Hypertension in her brother, father, mother, and sister; Other  in her brother. There is no history of Heart attack or Stroke.    Physical Exam: Patient Vitals for the past 24 hrs:  BP Temp Temp src Pulse Resp SpO2 Height Weight  06/21/14 2059 (!) 125/54 mmHg 98.2 F (36.8 C) - 97 17 92 % - -  06/21/14 1830 113/61 mmHg - - 82 17 98 % - -  06/21/14 1800 (!) 107/41 mmHg - - 85 21 99 % - -  06/21/14 1730 139/61 mmHg - - 97 20 99 % - -  06/21/14 1700 125/70 mmHg - - 91 20 97 % - -  06/21/14 1630 130/68 mmHg - - 81 19 96 % - -  06/21/14 1600 131/57 mmHg - - 89 19 98 % - -  06/21/14 1530 118/60 mmHg - - 86 22 97 % - -  06/21/14 1500 (!) 101/50 mmHg - - 88 19 98 % - -  06/21/14 1406 109/69 mmHg 98.8 F (37.1 C) Oral 112 18 93 % 5\' 1"  (1.549 m) 79.833 kg (176 lb)    1. General:  in No Acute distress 2. Psychological: Alert and   Oriented 3. Head/ENT:   Moist  Mucous Membranes                          Head Non traumatic, neck supple                          Normal Dentition 4. SKIN:   decreased Skin turgor,  Skin clean Dry and intact no rash 5. Heart: Regular rate and rhythm no Murmur, Rub or gallop 6. Lungs:   no wheezes or crackles  Slightly dull breath sounds on the right 7. Abdomen: Soft,  Epigastric area slightly tender, Non distended 8. Lower extremities: no clubbing, cyanosis, or edema 9. Neurologically Grossly intact, moving all 4 extremities equally 10. MSK: Normal range of motion  occult positive  body mass index is 33.27 kg/(m^2).   Labs on Admission:   Results for orders placed  or performed during the hospital encounter of 06/21/14 (from the past 24 hour(s))  CBC with Differential/Platelet     Status: Abnormal   Collection Time: 06/21/14  2:40 PM  Result Value Ref Range   WBC 7.0 4.0 - 10.5 K/uL   RBC 2.65 (L) 3.87 - 5.11 MIL/uL   Hemoglobin 7.8 (L) 12.0 - 15.0 g/dL   HCT 26.8 (L) 36.0 - 46.0 %   MCV 101.1 (H) 78.0 - 100.0 fL   MCH 29.4 26.0 - 34.0 pg   MCHC 29.1 (L) 30.0 - 36.0 g/dL   RDW 14.6 11.5 - 15.5 %   Platelets 267 150 - 400 K/uL   Neutrophils Relative % 80 (H) 43 - 77 %   Neutro Abs 5.6 1.7 - 7.7 K/uL   Lymphocytes Relative 11 (L) 12 - 46 %   Lymphs Abs 0.7 0.7 - 4.0 K/uL   Monocytes Relative 7 3 - 12 %   Monocytes Absolute 0.5 0.1 - 1.0 K/uL   Eosinophils Relative 2 0 - 5 %   Eosinophils Absolute 0.1 0.0 - 0.7 K/uL   Basophils Relative 0 0 - 1 %   Basophils Absolute 0.0 0.0 - 0.1 K/uL  Basic metabolic panel     Status: Abnormal   Collection Time: 06/21/14  2:40 PM  Result Value Ref Range   Sodium 138 135 - 145 mmol/L   Potassium 4.6 3.5 -  5.1 mmol/L   Chloride 101 96 - 112 mmol/L   CO2 30 19 - 32 mmol/L   Glucose, Bld 115 (H) 70 - 99 mg/dL   BUN 28 (H) 6 - 23 mg/dL   Creatinine, Ser 1.55 (H) 0.50 - 1.10 mg/dL   Calcium 8.9 8.4 - 10.5 mg/dL   GFR calc non Af Amer 29 (L) >90 mL/min   GFR calc Af Amer 33 (L) >90 mL/min   Anion gap 7 5 - 15  Troponin I     Status: None   Collection Time: 06/21/14  2:40 PM  Result Value Ref Range   Troponin I 0.03 <0.031 ng/mL  Occult blood card to lab, stool RN will collect     Status: Abnormal   Collection Time: 06/21/14  3:40 PM  Result Value Ref Range   Fecal Occult Bld POSITIVE (A) NEGATIVE    UA not obtained  Lab Results  Component Value Date   HGBA1C 6.4* 09/29/2011    Estimated Creatinine Clearance: 24 mL/min (by C-G formula based on Cr of 1.55).  BNP (last 3 results)  Recent Labs  09/12/13 1638  PROBNP 84.0    Other results:  I have pearsonaly reviewed this: ECG  REPORT  Rate: 86  Rhythm: Atrial fibrillation with PVCs ST&T Change: T wave inversions  Last echogram Echo (09/2013): EF 55%, normal wall motion, mild aortic stenosis (mean 12 mm Hg), MAC, mild MR, mild LAE, mild PI, PASP 30 mm Hg Lexiscan Myoview (7/15): No ischemia or infarct, EF 50%, Low Risk Filed Weights   06/21/14 1406  Weight: 79.833 kg (176 lb)     Cultures:    Component Value Date/Time   SDES NOSE 03/14/2012 0836   SPECREQUEST NONE 03/14/2012 0836   CULT  03/14/2012 0836    NO STAPHYLOCOCCUS AUREUS ISOLATED Note: No MRSA Isolated   REPTSTATUS 03/16/2012 FINAL 03/14/2012 0836     Radiological Exams on Admission: Dg Chest 2 View  06/21/2014   CLINICAL DATA:  Weakness and shortness of breath for the past 3 weeks.  EXAM: CHEST  2 VIEW  COMPARISON:  02/26/2014.  FINDINGS: Stable enlarged cardiac silhouette. Interval patchy airspace opacity in the right upper lobe. Interval minimal bilateral pleural fluid. The pulmonary vasculature and interstitial markings remain prominent. Right neck surgical clips are again demonstrated. Thoracic spine degenerative changes. Atheromatous arterial calcifications.  IMPRESSION: 1. Right upper lobe pneumonia. 2. Minimal bilateral pleural effusions. 3. Stable cardiomegaly, pulmonary vascular congestion and chronic interstitial lung disease.   Electronically Signed   By: Claudie Revering M.D.   On: 06/21/2014 16:28    Chart has been reviewed  Assessment/Plan  81 yo F with hx of CAD, Combined CHF, a. Fib on anticoagulation here with symptomatic anemia and hemoccult positive stools.   Present on Admission:  . GI bleed - likley slow blood loss, source unclear but cannot rule out upper source. Will start protonix, Gi consult is aware, Serial CBC, transfuse 2 unit of PRBC. NPO post midnight . Acute blood loss anemia - will transfuse given that patient is symptomatic . Acute on chronic renal failure - mildly worsened compare to baseline, will continue  to follow patietn is to receive blood products. Hold tonight lasix.  . Atrial fibrillation - currently rate controlled, continue Cardizem but with holding parameters and will change to BID dosing for easier adjustment in case of hypotension . CAP (community acquired pneumonia) -  - will admit for treatment of CAP will start on appropriate antibiotic coverage.  Obtain sputum cultures, blood cultures if febrile or if decompensates.  Provide oxygen as needed.  . Chronic combined systolic and diastolic heart failure - currently stable continue lasix with holding parameters watch Cr . Coronary atherosclerosis of native coronary artery - stable holding aspirin given potential upper GI source, continue statin, patietn t does not tolerate betablocker's COPD continue home meds currently stable    Prophylaxis: SCD , Protonix  CODE STATUS:   DNR/DNI as per patient's wishes  Other plan as per orders.  I have spent a total of 55 min on this admission  [name] [name] 06/21/2014, 9:12 PM  [facility]  Pager [phone]   after 2 AM please page floor coverage PA If 7AM-7PM, please contact the day team taking care of the patient  [email or web address]  Password [email or web address]

## 2014-06-21 NOTE — ED Notes (Signed)
Provider at bedside for update.  Patient made aware she will need to be transferred to [facility] for inpatient services.

## 2014-06-21 NOTE — Progress Notes (Signed)
Patient presents with weakness, lightheadedness, last 2 weeks, seen by her PCP before 2 days, hemoglobin is 8.1, baseline hemoglobin around 10, resents today to [facility] with similar complaints, workup was significant for hemoglobin of 7.8, had Hemoccult-positive stool( dark in color but no melena or bright red blood per rectum), as well no coffee-ground emesis, patient is on Eliquis for A. Fib, will need type and screen and transfusion at [facility] does not have the capability of type and screen and routine blood), ED will consult [facility]. - As well patient was noticed to be hypoxic to the 90% on room air, known history of COPD, chest x-ray showing right upper lung pneumonia, started on IV levofloxacin. - Patient has been accepted to telemetry bed. Phillips Climes MD.

## 2014-06-21 NOTE — ED Notes (Signed)
Patient made aware that carelink wait would be >1 hour.

## 2014-06-21 NOTE — ED Notes (Signed)
Per daughter-patient was seen by PCP last week on Wednesday and had blood drawn.  Reports results came back yesterday.  Hgb-8.1  Reports that MD stated that if she began having these symptoms (dizziness, weakness) that she could possible need a blood transfusion.  Reports that she was supposed to bring a stool specimen to doctor's office to check for blood.  Patient currently reports intermittent shortness of breath and weakness which she reported began a few weeks ago, dizziness-started this morning.  Per daughter patient is in rate-controlled a-fib and last saw cardiologist 2 weeks ago.  Reports shortness of breath on exertion.

## 2014-06-21 NOTE — ED Notes (Signed)
Pt reports she has been feeling bad the 2 weeks- "weak"- seen by cardiology and was told it was due to her afib but did not change any meds- reports dizzy since this morning

## 2014-06-22 DIAGNOSIS — J189 Pneumonia, unspecified organism: Secondary | ICD-10-CM

## 2014-06-22 DIAGNOSIS — I4891 Unspecified atrial fibrillation: Secondary | ICD-10-CM

## 2014-06-22 DIAGNOSIS — D62 Acute posthemorrhagic anemia: Secondary | ICD-10-CM

## 2014-06-22 LAB — TROPONIN I
Troponin I: <0.03 ng/mL (ref <0.031)
Troponin I: <0.03 ng/mL (ref <0.031)

## 2014-06-22 LAB — COMPREHENSIVE METABOLIC PANEL
ALBUMIN: 2.7 g/dL — AB (ref 3.5–5.2)
ALK PHOS: 57 U/L (ref 39–117)
ALT: 12 U/L (ref 0–35)
AST: 13 U/L (ref 0–37)
Anion gap: 6 (ref 5–15)
BILIRUBIN TOTAL: 1.8 mg/dL — AB (ref 0.3–1.2)
BUN: 24 mg/dL — AB (ref 6–23)
CO2: 31 mmol/L (ref 19–32)
Calcium: 8.4 mg/dL (ref 8.4–10.5)
Chloride: 102 mmol/L (ref 96–112)
Creatinine, Ser: 1.43 mg/dL — ABNORMAL HIGH (ref 0.50–1.10)
GFR calc Af Amer: 37 mL/min — ABNORMAL LOW (ref >90)
GFR, EST NON AFRICAN AMERICAN: 32 mL/min — AB (ref >90)
Glucose, Bld: 92 mg/dL (ref 70–99)
Potassium: 4.8 mmol/L (ref 3.5–5.1)
Sodium: 139 mmol/L (ref 135–145)
TOTAL PROTEIN: 5.9 g/dL — AB (ref 6.0–8.3)

## 2014-06-22 LAB — IRON AND TIBC
Iron: 19 ug/dL — ABNORMAL LOW (ref 42–145)
SATURATION RATIOS: 7 % — AB (ref 20–55)
TIBC: 283 ug/dL (ref 250–470)
UIBC: 264 ug/dL (ref 125–400)

## 2014-06-22 LAB — TSH: TSH: 1.315 u[IU]/mL (ref 0.350–4.500)

## 2014-06-22 LAB — PHOSPHORUS: Phosphorus: 4.3 mg/dL (ref 2.3–4.6)

## 2014-06-22 LAB — VITAMIN B12: VITAMIN B 12: 295 pg/mL (ref 211–911)

## 2014-06-22 LAB — FOLATE: Folate: 13.5 ng/mL

## 2014-06-22 LAB — FERRITIN: FERRITIN: 32 ng/mL (ref 10–291)

## 2014-06-22 LAB — MAGNESIUM: Magnesium: 2.3 mg/dL (ref 1.5–2.5)

## 2014-06-22 NOTE — Progress Notes (Signed)
Patient Demographics  [name] [name], is a 81 y.o. female, DOB - Aug 05, 1923, HYW:[id number]  Admit date - 06/21/2014   Admitting Physician Toy Baker, MD  Outpatient Primary MD for the patient is Carlos Levering, PA-C  LOS - 1   Chief Complaint  Patient presents with  . Dizziness      Admission history of present illness/brief narrative: 81 year old female with past medical history with past medical history of CAD, CVA, chronic A. fib on Eliquis, C. difficile colitis in the distant past, COPD presents with lightheadedness, weakness, found to have anemia, hemoglobin 7.8 from baseline 10, was Hemoccult positive, denies any melena, or bright red blood per rectum, transfused 2 units packed red blood cell 06/21/14 with good response, as well his x-ray was significant for pneumonia.  Subjective:   [name] [name] today has, No headache, No chest pain, No abdominal pain - No Nausea, No new weakness tingling or numbness, reports small cough, has baseline shortness of breath.  Assessment & Plan    Active Problems:   Atrial fibrillation   Chronic combined systolic and diastolic heart failure   Acute blood loss anemia   Coronary atherosclerosis of native coronary artery   Acute on chronic renal failure   GI bleed   CAP (community acquired pneumonia)   Symptomatic anemia  Acute blood loss anemia/GI bleed - This is most likely slow blood loss, this post 2 units packed red blood cell transfusion 4/16 with good response. - Continue to hold Eliquis in the setting of GI bleed. - Monitor hemoglobin closely, continue with IV Protonix. - Gastric logic consult appreciated, at one point when patient is more stable may need EGD.  Atrial fibrillation - Rate controlled, continue with Cardizem, amiodarone. - Continue to hold Eliquis given GI bleed ChadVasc  >2  Community-acquired pneumonia - Continue with levofloxacin, follow sputum cultures( will stop antibiotics*patient is improving, given her history of C. Difficile)  Combined systolic and diastolic CHF - Currently appears to be euvolemic, continue with Lasix.  Coronary artery disease - Continue to hold aspirin given GI bleed, patient does not tolerate beta blockers.  History of COPD - And tinea with home medication, no active wheezing.  Code Status: DO NOT RESUSCITATE  Family Communication: None at bedside  Disposition Plan: Home once stable   Procedures  None   Consults   Gastroenterology   Medications  Scheduled Meds: . allopurinol  100 mg Oral q morning - 10a  . amiodarone  200 mg Oral Daily  . atorvastatin  40 mg Oral QHS  . budesonide-formoterol  2 puff Inhalation BID  . diltiazem  120 mg Oral Q12H  . furosemide  20 mg Oral q morning - 10a  . [START ON 06/23/2014] levofloxacin (LEVAQUIN) IV  750 mg Intravenous Q48H  . montelukast  10 mg Oral QHS  . pantoprazole (PROTONIX) IV  40 mg Intravenous Q12H  . raloxifene  60 mg Oral q morning - 10a  . sodium chloride  3 mL Intravenous Q12H  . sodium chloride  3 mL Intravenous Q12H   Continuous Infusions:  PRN Meds:.sodium chloride, acetaminophen **OR** acetaminophen, HYDROcodone-acetaminophen, levalbuterol, ondansetron **OR** ondansetron (ZOFRAN) IV, polyethylene glycol, sodium chloride  DVT Prophylaxis   SCDs ( due to active GI bleed)  Lab  Results  Component Value Date   PLT 257 06/21/2014    Antibiotics   Anti-infectives    Start     Dose/Rate Route Frequency Ordered Stop   06/23/14 1200  levofloxacin (LEVAQUIN) IVPB 750 mg     750 mg 100 mL/hr over 90 Minutes Intravenous Every 48 hours 06/21/14 2305     06/21/14 1715  levofloxacin (LEVAQUIN) IVPB 750 mg     750 mg 100 mL/hr over 90 Minutes Intravenous  Once 06/21/14 1704 06/21/14 1847          Objective:   Filed Vitals:   06/22/14 1020 06/22/14  1024 06/22/14 1053 06/22/14 1438  BP:    92/47  Pulse:    66  Temp:    98.8 F (37.1 C)  TempSrc:    Oral  Resp:    20  Height:      Weight:      SpO2: 84% 95% 94% 92%    Wt Readings from Last 3 Encounters:  06/21/14 76.5 kg (168 lb 10.4 oz)  06/06/14 79.379 kg (175 lb)  04/28/14 78.019 kg (172 lb)     Intake/Output Summary (Last 24 hours) at 06/22/14 1458 Last data filed at 06/22/14 1300  Gross per 24 hour  Intake    461 ml  Output    350 ml  Net    111 ml     Physical Exam  Awake Alert, Oriented X 3, No new F.N deficits, Normal affect [place].AT,PERRAL Supple Neck,No JVD, No cervical lymphadenopathy appriciated.  Symmetrical Chest wall movement, Good air movement bilaterally,  Irregular,No Gallops,Rubs or new Murmurs, No Parasternal Heave +ve B.Sounds, Abd Soft, No tenderness, No organomegaly appriciated, No rebound - guarding or rigidity. No Cyanosis, Clubbing or edema, No new Rash or bruise    Data Review   Micro Results No results found for this or any previous visit (from the past 240 hour(s)).  Radiology Reports Dg Chest 2 View  06/21/2014   CLINICAL DATA:  Weakness and shortness of breath for the past 3 weeks.  EXAM: CHEST  2 VIEW  COMPARISON:  02/26/2014.  FINDINGS: Stable enlarged cardiac silhouette. Interval patchy airspace opacity in the right upper lobe. Interval minimal bilateral pleural fluid. The pulmonary vasculature and interstitial markings remain prominent. Right neck surgical clips are again demonstrated. Thoracic spine degenerative changes. Atheromatous arterial calcifications.  IMPRESSION: 1. Right upper lobe pneumonia. 2. Minimal bilateral pleural effusions. 3. Stable cardiomegaly, pulmonary vascular congestion and chronic interstitial lung disease.   Electronically Signed   By: Claudie Revering M.D.   On: 06/21/2014 16:28    CBC  Recent Labs Lab 06/21/14 1440 06/21/14 2214  WBC 7.0 5.2  HGB 7.8* 7.2*  HCT 26.8* 23.7*  PLT 267 257  MCV 101.1*  99.6  MCH 29.4 30.3  MCHC 29.1* 30.4  RDW 14.6 14.9  LYMPHSABS 0.7  --   MONOABS 0.5  --   EOSABS 0.1  --   BASOSABS 0.0  --     Chemistries   Recent Labs Lab 06/21/14 1440 06/22/14 0702  NA 138 139  K 4.6 4.8  CL 101 102  CO2 30 31  GLUCOSE 115* 92  BUN 28* 24*  CREATININE 1.55* 1.43*  CALCIUM 8.9 8.4  MG  --  2.3  AST  --  13  ALT  --  12  ALKPHOS  --  57  BILITOT  --  1.8*   ------------------------------------------------------------------------------------------------------------------ estimated creatinine clearance is 25.5 mL/min (by C-G formula based  on Cr of 1.43). ------------------------------------------------------------------------------------------------------------------ No results for input(s): HGBA1C in the last 72 hours. ------------------------------------------------------------------------------------------------------------------ No results for input(s): CHOL, HDL, LDLCALC, TRIG, CHOLHDL, LDLDIRECT in the last 72 hours. ------------------------------------------------------------------------------------------------------------------  Recent Labs  06/22/14 0702  TSH 1.315   ------------------------------------------------------------------------------------------------------------------  Recent Labs  06/21/14 2214  RETICCTPCT 4.9*    Coagulation profile No results for input(s): INR, PROTIME in the last 168 hours.  No results for input(s): DDIMER in the last 72 hours.  Cardiac Enzymes  Recent Labs Lab 06/21/14 1440 06/21/14 2214 06/22/14 0702  TROPONINI 0.03 <0.03 <0.03   ------------------------------------------------------------------------------------------------------------------ Invalid input(s): POCBNP     Time Spent in minutes   30 minutes   Dody Smartt M.D on 06/22/2014 at 2:58 PM  Between 7am to 7pm - Pager - [phone]  After 7pm go to [email or web address] - password [email or web address]  And look for the night coverage  person covering for me after hours  [facility] Office  [phone]   **Disclaimer: This note may have been dictated with voice recognition software. Similar sounding words can inadvertently be transcribed and this note may contain transcription errors which may not have been corrected upon publication of note.**

## 2014-06-22 NOTE — Progress Notes (Signed)
Unable to order pt oral protocol due to fact that she has allergy to tiotropium bromide.

## 2014-06-22 NOTE — Progress Notes (Signed)
CARE MANAGEMENT NOTE 06/22/2014  Patient:  [name],[name]   Account Number:  [id number]  Date Initiated:  06/22/2014  Documentation initiated by:  Haris Baack  Subjective/Objective Assessment:   gi bld, pna     Action/Plan:   home when stable   Anticipated DC Date:  06/25/2014   Anticipated DC Plan:  HOME/SELF CARE  In-house referral  NA      DC Planning Services  CM consult      PAC Choice  NA   Choice offered to / List presented to:             Status of service:  In process, will continue to follow Medicare Important Message given?   (If response is "NO", the following Medicare IM given date fields will be blank) Date Medicare IM given:   Medicare IM given by:   Date Additional Medicare IM given:   Additional Medicare IM given by:    Discharge Disposition:    Per UR Regulation:  Reviewed for med. necessity/level of care/duration of stay  If discussed at [place] of Stay Meetings, dates discussed:    Comments:  June 22, 2014/[name] [name] L. Rosana Hoes, RN, BSN, CCM. Case Management [place] [phone] No discharge needs present of time of review.

## 2014-06-22 NOTE — Consult Note (Signed)
[facility] CON[facility] Reason for consult: heme positive stool in anemia Referring Physician: [facility] PCP: Elane Fritz PAC. Primary G.I.: Dr. Zorita Pang [name] [name] is an 81 y.o. female.  HPI: She has a multitude of chronic problems including CAD, cerebrovascular disease, chronic atrial fibrillation requiring anticoagulation, history C. difficile infection in the distant past. She has COPD is chronically short of breath in the cost for atrial fibrillation is chronically anticoagulated Eliquis. She presented to her PCP with progressive shortness of breath weakness and was found to have a drop in hemoglobin from a baseline of around 10 down to 8.1 and her stools were Hemoccult positive. She was extremely weak in short of breath was admitted with a diagnosis of low-grade G.I. bleeding. In addition, she was found to have a significant community acquired right upper lobe pneumonia. She notes that her stools are not usually black and she has no gross hematochezia. She states that she had a colonoscopy a number of years ago does not remember when. She has had EGD and flexible sigmoidoscopy in the past while in the hospital revealing some colitis probable C. difficile as well as [place] esophagitis. She denies heartburn or epigastric pain. She denies taking NSAIDs on a routine basis. There is a unique have  Past Medical History  Diagnosis Date  . Hypertension   . Hyperlipidemia   . COPD (chronic obstructive pulmonary disease)   . Osteoporosis   . H/O hiatal hernia   . Chronic back pain   . CAD (coronary artery disease)     LHC 9/05:  pLAD less than 20%, D1 20-30%, ostial RCA 40-50%, proximal RCA 20%, EF 60%.  . C. difficile diarrhea   . Gout   . Atrial fibrillation     a. failed DCCV => b. amiodarone added => converted to NSR on Amiodarone;  c. Xarelto d/c'd 2/2 falling (wrist and hip Fx in 1/14)  . Carotid stenosis     s/p bilat CEA  . C. difficile colitis     10/2011;  11/2011  .  Salmonella enteritis 08/2011    c/b septic shock  . Hx of echocardiogram     a. Echocardiogram 09/29/11: Mild LVH, EF 45-50%, diffuse HK, mild to moderate aortic stenosis, mean gradient 12 mmHg, moderate MAC, mild MR, moderate LAE, moderate RAE, question small secundum ASD with left to right flow not seen in 4 chamber view, PASP 35-39 (mild pulmonary hypertension). ;  b.  TEE on 10/04/11: Mild LVH, EF 55%, mild MR, no defect or PFO  . Pneumonia 11/2101  . PONV (postoperative nausea and vomiting)   . CHF (congestive heart failure)   . Chronic kidney disease   . GERD (gastroesophageal reflux disease)   . Hepatitis   . Anemia   . Hx of echocardiogram     Echo (09/2013): EF 55%, normal wall motion, mild aortic stenosis (mean 12 mm Hg), MAC, mild MR, mild LAE, mild PI, PASP 30 mm Hg  . Hx of cardiovascular stress test     Lexiscan Myoview (7/15):  No ischemia or infarct, EF 50%, Low Risk  . Arthritis     Neck  . PAF (paroxysmal atrial fibrillation)     Past Surgical History  Procedure Laterality Date  . Carotid endarterectomy      bilateral  . Cataract extraction, bilateral    . Joint replacement  2008    Right Total Knee  . Anal fistula repair    . Left parotidectomy    . Eye  surgery    . Flexible sigmoidoscopy  09/03/2011    Procedure: FLEXIBLE SIGMOIDOSCOPY;  Surgeon: Beryle Beams, MD;  Location: [facility];  Service: Endoscopy;  Laterality: N/A;  . Tee without cardioversion  10/04/2011    Procedure: TRANSESOPHAGEAL ECHOCARDIOGRAM (TEE);  Surgeon: Larey Dresser, MD;  Location: [place];  Service: Cardiovascular;  Laterality: N/A;  to be carelinked here by 1230-verified 7/29/dl  . Cardioversion  10/04/2011    Procedure: CARDIOVERSION;  Surgeon: Larey Dresser, MD;  Location[facility] END[facility]COPY;  Service: Cardiovascular;  Laterality: N/A;  . Esophagogastroduodenoscopy  11/29/2011    Procedure: ESOPHAGOGASTRODUODENOSCOPY (EGD);  Surgeon: Cleotis Nipper, MD;  Location: [facility];   Service: Endoscopy;  Laterality: N/A;  recent h/o resp failure/pneumonia  . Left hip fracture repair  03/14/12  . Open reduction internal fixation (orif) scaphoid with distal radius graft  03/14/2012    Procedure: OPEN REDUCTION INTERNAL FIXATION (ORIF) SCAPHOID WITH DISTAL RADIUS GRAFT;  Surgeon: Jolyn Nap, MD;  Location: [facility];  Service: Orthopedics;  Laterality: Left;  DVR wrist fracture set/ hand innovation  . Hip pinning,cannulated  03/14/2012    Procedure: CANNULATED HIP PINNING;  Surgeon: Jolyn Nap, MD;  Location: [facility];  Service: Orthopedics;  Laterality: Left;  Biomet 6.5 cannulated screws  . Femur im nail Left 04/13/2012    Procedure: INTRAMEDULLARY (IM) NAIL FEMORAL;  Surgeon: Jolyn Nap, MD;  Location: [place];  Service: Orthopedics;  Laterality: Left;  OPEN REDUCTION INTERNAL FIXATION LEFT PROXIMAL FEMUR Fracture     Family History  Problem Relation Age of Onset  . Hemochromatosis Sister   . Diabetes Sister   . Hypertension Sister   . Diabetes type II Mother   . Diabetes Mother   . Hypertension Mother   . Deep vein thrombosis Mother   . Hypertension Father   . Hypertension Brother   . Other Brother     Leg amputation-Gun shot  . Depression Brother   . Hemochromatosis Sister   . Heart attack Neg Hx   . Stroke Neg Hx   . Cancer Sister     Social History:  reports that she quit smoking about 30 years ago. Her smoking use included Cigarettes. She has a 20 pack-year smoking history. She has never used smokeless tobacco. She reports that she does not drink alcohol or use illicit drugs.  Allergies:  Allergies  Allergen Reactions  . Baby Powder [Methylbenzethonium] Shortness Of Breath  . Demerol [Meperidine] Hives  . Penicillins Hives  . Spiriva Handihaler [Tiotropium Bromide Monohydrate]     Caused chest pains    Medications; Prior to Admission medications   Medication Sig Start Date End Date Taking? Authorizing Provider  allopurinol (ZYLOPRIM) 100 MG  tablet Take 100 mg by mouth every morning.    Yes Historical Provider, MD  amiodarone (PACERONE) 200 MG tablet Take 1 tablet (200 mg total) by mouth daily. 04/07/14  Yes Erlene Quan, PA-C  apixaban (ELIQUIS) 2.5 MG TABS tablet Take 1 tablet (2.5 mg total) by mouth 2 (two) times daily. 04/14/14  Yes Burnell Blanks, MD  aspirin 325 MG tablet Take 325 mg by mouth daily.   Yes Historical Provider, MD  atorvastatin (LIPITOR) 40 MG tablet Take 40 mg by mouth at bedtime.   Yes Historical Provider, MD  budesonide-formoterol (SYMBICORT) 160-4.5 MCG/ACT inhaler Inhale 2 puffs into the lungs 2 (two) times daily.   Yes Historical Provider, MD  cholecalciferol (VITAMIN D) 1000 UNITS tablet Take 1,000-2,000 Units  by mouth 2 (two) times daily. Take 2000 units in the morning and 1000 units in the evening   Yes Historical Provider, MD  diltiazem (CARDIZEM CD) 120 MG 24 hr capsule Take 240 mg by mouth daily.  05/18/14  Yes Historical Provider, MD  ferrous sulfate 325 (65 FE) MG tablet Take 325 mg by mouth 2 (two) times daily with a meal.    Yes Historical Provider, MD  fish oil-omega-3 fatty acids 1000 MG capsule Take 1 g by mouth 2 (two) times daily.    Yes Historical Provider, MD  Flaxseed, Linseed, (FLAX SEED OIL PO) Take 1 capsule by mouth 2 (two) times daily.    Yes Historical Provider, MD  furosemide (LASIX) 20 MG tablet Take 20 mg by mouth every morning.    Yes Historical Provider, MD  levalbuterol [facility] HFA) 45 MCG/ACT inhaler Inhale 2 puffs into the lungs every 6 (six) hours as needed for wheezing.    Yes Historical Provider, MD  meclizine (ANTIVERT) 25 MG tablet Take 1 tablet (25 mg total) by mouth 2 (two) times daily as needed for dizziness. 07/06/13  Yes Merryl Hacker, MD  montelukast (SINGULAIR) 10 MG tablet Take 10 mg by mouth at bedtime.   Yes Historical Provider, MD  omeprazole (PRILOSEC) 40 MG capsule Take 40 mg by mouth every evening.   Yes Historical Provider, MD  polyethylene glycol  (MIRALAX / GLYCOLAX) packet Take 17 g by mouth daily as needed for mild constipation.   Yes Historical Provider, MD  raloxifene (EVISTA) 60 MG tablet Take 60 mg by mouth every morning.    Yes Historical Provider, MD   . allopurinol  100 mg Oral q morning - 10a  . amiodarone  200 mg Oral Daily  . atorvastatin  40 mg Oral QHS  . budesonide-formoterol  2 puff Inhalation BID  . diltiazem  120 mg Oral Q12H  . furosemide  20 mg Oral q morning - 10a  . [START ON 06/23/2014] levofloxacin (LEVAQUIN) IV  750 mg Intravenous Q48H  . montelukast  10 mg Oral QHS  . pantoprazole (PROTONIX) IV  40 mg Intravenous Q12H  . raloxifene  60 mg Oral q morning - 10a  . sodium chloride  3 mL Intravenous Q12H  . sodium chloride  3 mL Intravenous Q12H   PRN Meds sodium chloride, acetaminophen **OR** acetaminophen, HYDROcodone-acetaminophen, levalbuterol, ondansetron **OR** ondansetron (ZOFRAN) IV, polyethylene glycol, sodium chloride Results for orders placed or performed during the hospital encounter of 06/21/14 (from the past 48 hour(s))  CBC with Differential/Platelet     Status: Abnormal   Collection Time: 06/21/14  2:40 PM  Result Value Ref Range   WBC 7.0 4.0 - 10.5 K/uL   RBC 2.65 (L) 3.87 - 5.11 MIL/uL   Hemoglobin 7.8 (L) 12.0 - 15.0 g/dL   HCT 26.8 (L) 36.0 - 46.0 %   MCV 101.1 (H) 78.0 - 100.0 fL   MCH 29.4 26.0 - 34.0 pg   MCHC 29.1 (L) 30.0 - 36.0 g/dL   RDW 14.6 11.5 - 15.5 %   Platelets 267 150 - 400 K/uL   Neutrophils Relative % 80 (H) 43 - 77 %   Neutro Abs 5.6 1.7 - 7.7 K/uL   Lymphocytes Relative 11 (L) 12 - 46 %   Lymphs Abs 0.7 0.7 - 4.0 K/uL   Monocytes Relative 7 3 - 12 %   Monocytes Absolute 0.5 0.1 - 1.0 K/uL   Eosinophils Relative 2 0 - 5 %  Eosinophils Absolute 0.1 0.0 - 0.7 K/uL   Basophils Relative 0 0 - 1 %   Basophils Absolute 0.0 0.0 - 0.1 K/uL  Basic metabolic panel     Status: Abnormal   Collection Time: 06/21/14  2:40 PM  Result Value Ref Range   Sodium 138 135 -  145 mmol/L   Potassium 4.6 3.5 - 5.1 mmol/L   Chloride 101 96 - 112 mmol/L   CO2 30 19 - 32 mmol/L   Glucose, Bld 115 (H) 70 - 99 mg/dL   BUN 28 (H) 6 - 23 mg/dL   Creatinine, Ser 1.55 (H) 0.50 - 1.10 mg/dL   Calcium 8.9 8.4 - 10.5 mg/dL   GFR calc non Af Amer 29 (L) >90 mL/min   GFR calc Af Amer 33 (L) >90 mL/min    Comment: (NOTE) The eGFR has been calculated using the CKD EPI equation. This calculation has not been validated in all clinical situations. eGFR's persistently <90 mL/min signify possible Chronic Kidney Disease.    Anion gap 7 5 - 15  Troponin I     Status: None   Collection Time: 06/21/14  2:40 PM  Result Value Ref Range   Troponin I 0.03 <0.031 ng/mL    Comment:        NO INDICATION OF MYOCARDIAL INJURY.   Occult blood card to lab, stool RN will collect     Status: Abnormal   Collection Time: 06/21/14  3:40 PM  Result Value Ref Range   Fecal Occult Bld POSITIVE (A) NEGATIVE  Prepare RBC     Status: None   Collection Time: 06/21/14  9:05 PM  Result Value Ref Range   Order Confirmation ORDER PROCESSED BY BLOOD BANK   Reticulocytes     Status: Abnormal   Collection Time: 06/21/14 10:14 PM  Result Value Ref Range   Retic Ct Pct 4.9 (H) 0.4 - 3.1 %   RBC. 2.38 (L) 3.87 - 5.11 MIL/uL   Retic Count, Manual 116.6 19.0 - 186.0 K/uL  CBC     Status: Abnormal   Collection Time: 06/21/14 10:14 PM  Result Value Ref Range   WBC 5.2 4.0 - 10.5 K/uL   RBC 2.38 (L) 3.87 - 5.11 MIL/uL   Hemoglobin 7.2 (L) 12.0 - 15.0 g/dL   HCT 23.7 (L) 36.0 - 46.0 %   MCV 99.6 78.0 - 100.0 fL   MCH 30.3 26.0 - 34.0 pg   MCHC 30.4 30.0 - 36.0 g/dL   RDW 14.9 11.5 - 15.5 %   Platelets 257 150 - 400 K/uL  Type and screen     Status: None (Preliminary result)   Collection Time: 06/21/14 10:14 PM  Result Value Ref Range   ABO/RH(D) O POS    Antibody Screen POS    Sample Expiration 06/24/2014    Antibody Identification ANTI K    DAT, IgG NEG    Unit Number [id number]    Blood  Component Type RED CELLS,LR    Unit division 00    Status of Unit ISSUED    Donor AG Type NEGATIVE FOR KELL ANTIGEN    Transfusion Status OK TO TRANSFUSE    Crossmatch Result COMPATIBLE    Unit Number [id number]    Blood Component Type RBC LR PHER2    Unit division 00    Status of Unit ISSUED    Donor AG Type NEGATIVE FOR KELL ANTIGEN    Transfusion Status OK TO TRANSFUSE    Crossmatch Result COMPATIBLE  Troponin I (q 6hr x 3)     Status: None   Collection Time: 06/21/14 10:14 PM  Result Value Ref Range   Troponin I <0.03 <0.031 ng/mL    Comment:        NO INDICATION OF MYOCARDIAL INJURY.     Dg Chest 2 View  06/21/2014   CLINICAL DATA:  Weakness and shortness of breath for the past 3 weeks.  EXAM: CHEST  2 VIEW  COMPARISON:  02/26/2014.  FINDINGS: Stable enlarged cardiac silhouette. Interval patchy airspace opacity in the right upper lobe. Interval minimal bilateral pleural fluid. The pulmonary vasculature and interstitial markings remain prominent. Right neck surgical clips are again demonstrated. Thoracic spine degenerative changes. Atheromatous arterial calcifications.  IMPRESSION: 1. Right upper lobe pneumonia. 2. Minimal bilateral pleural effusions. 3. Stable cardiomegaly, pulmonary vascular congestion and chronic interstitial lung disease.   Electronically Signed   By: Claudie Revering M.D.   On: 06/21/2014 16:28   ROS: Constitutional: weakness with exertion is noted above HEENT: chronic headaches are gross visual disturbances Cardiovascular: no gross chest pain but frequent irregular heartbeat Respiratory: as above GI: as above GU: no dysuria Musculoskeletal: as above Neuro/Psychiatric: grossly normal Endocrine/Heme:            Blood pressure 112/60, pulse 94, temperature 98 F (36.7 C), temperature source Oral, resp. rate 16, height '5\' 1"'  (1.549 m), weight 76.5 kg (168 lb 10.4 oz), SpO2 99 %.  Physical exam:   General--alert white female in no acute  distress ENT-- nonicteric Neck-- no lymphadenopathy Heart-- regular rate and rhythm without murmurs are gallops does not appear to be irregular Lungs--clear anteriorly posteriorly Abdomen-- soft nontender Psych-- alert and oriented   Assessment: 1. Heme positive stool and anemia. Probably due to Eliquis. She has had EGD and sigmoid within the past 3 years. Given her pneumonia would probably defer colonoscopy at this time when doing better could probably do EGD 2. Community acquired right upper lobe pneumonia 3. Atrial fib on chronic anticoagulation 4. Multiple chronic problems is noted above  Plan: at this point I would go ahead and treat her pneumonia and hold the Eliquis. In the next several days felt to be stable we could probably go ahead with EGD. We will follow with you.    Arma Reining JR,Jaculin Rasmus L 06/22/2014, 7:19 AM

## 2014-06-22 NOTE — Progress Notes (Signed)
Pt just finished her blood at  0630, and according to lab, the CBC wouldn't be accurate until 2 hrs after the blood, so I will change the lab times accordingly to 0830.

## 2014-06-23 DIAGNOSIS — I5042 Chronic combined systolic (congestive) and diastolic (congestive) heart failure: Secondary | ICD-10-CM

## 2014-06-23 LAB — CBC
HCT: 29.5 % — ABNORMAL LOW (ref 36.0–46.0)
HEMATOCRIT: 31.1 % — AB (ref 36.0–46.0)
HEMOGLOBIN: 9.5 g/dL — AB (ref 12.0–15.0)
Hemoglobin: 9 g/dL — ABNORMAL LOW (ref 12.0–15.0)
MCH: 29.7 pg (ref 26.0–34.0)
MCH: 29.2 pg (ref 26.0–34.0)
MCHC: 30.5 g/dL (ref 30.0–36.0)
MCHC: 30.5 g/dL (ref 30.0–36.0)
MCV: 97.2 fL (ref 78.0–100.0)
MCV: 95.8 fL (ref 78.0–100.0)
PLATELETS: 218 10*3/uL (ref 150–400)
Platelets: 232 10*3/uL (ref 150–400)
RBC: 3.2 MIL/uL — ABNORMAL LOW (ref 3.87–5.11)
RBC: 3.08 MIL/uL — ABNORMAL LOW (ref 3.87–5.11)
RDW: 17.3 % — ABNORMAL HIGH (ref 11.5–15.5)
RDW: 17 % — AB (ref 11.5–15.5)
WBC: 6.2 10*3/uL (ref 4.0–10.5)
WBC: 7.7 10*3/uL (ref 4.0–10.5)

## 2014-06-23 LAB — BASIC METABOLIC PANEL
ANION GAP: 6 (ref 5–15)
BUN: 26 mg/dL — ABNORMAL HIGH (ref 6–23)
CO2: 30 mmol/L (ref 19–32)
Calcium: 8.2 mg/dL — ABNORMAL LOW (ref 8.4–10.5)
Chloride: 102 mmol/L (ref 96–112)
Creatinine, Ser: 1.49 mg/dL — ABNORMAL HIGH (ref 0.50–1.10)
GFR, EST AFRICAN AMERICAN: 35 mL/min — AB (ref >90)
GFR, EST NON AFRICAN AMERICAN: 30 mL/min — AB (ref >90)
GLUCOSE: 101 mg/dL — AB (ref 70–99)
POTASSIUM: 4.4 mmol/L (ref 3.5–5.1)
Sodium: 138 mmol/L (ref 135–145)

## 2014-06-23 LAB — TYPE AND SCREEN
ABO/RH(D): O POS
Antibody Screen: POSITIVE
DAT, IgG: NEGATIVE
DONOR AG TYPE: NEGATIVE
Donor AG Type: NEGATIVE
UNIT DIVISION: 0
UNIT DIVISION: 0

## 2014-06-23 MED ORDER — PANTOPRAZOLE SODIUM 40 MG PO TBEC
40.0000 mg | DELAYED_RELEASE_TABLET | Freq: Two times a day (BID) | ORAL | Status: DC
  Administered 2014-06-23 – 2014-06-24 (×2): 40 mg via ORAL
  Filled 2014-06-23 (×2): qty 1

## 2014-06-23 NOTE — Progress Notes (Signed)
Subjective: Shortness of breath improving, but not at baseline.  Objective: Vital signs in last 24 hours: Temp:  [97.9 F (36.6 C)-98.8 F (37.1 C)] 98 F (36.7 C) (04/18 0629) Pulse Rate:  [60-104] 97 (04/18 0921) Resp:  [18-20] 18 (04/18 0921) BP: (90-130)/(47-106) 101/52 mmHg (04/18 0921) SpO2:  [92 %-98 %] 92 % (04/18 1049) Weight:  [77.1 kg (169 lb 15.6 oz)] 77.1 kg (169 lb 15.6 oz) (04/18 0629) Weight change: -2.733 kg (-6 lb 0.4 oz) Last BM Date: 06/21/14  PE: GEN:  Chronically ill-appearing, mildly dyspneic ABD:  Soft  Lab Results: CBC    Component Value Date/Time   WBC 7.7 06/23/2014 0433   RBC 3.08* 06/23/2014 0433   RBC 2.38* 06/21/2014 2214   HGB 9.0* 06/23/2014 0433   HCT 29.5* 06/23/2014 0433   PLT 218 06/23/2014 0433   MCV 95.8 06/23/2014 0433   MCH 29.2 06/23/2014 0433   MCHC 30.5 06/23/2014 0433   RDW 17.0* 06/23/2014 0433   LYMPHSABS 0.7 06/21/2014 1440   MONOABS 0.5 06/21/2014 1440   EOSABS 0.1 06/21/2014 1440   BASOSABS 0.0 06/21/2014 1440   CMP     Component Value Date/Time   NA 138 06/23/2014 0433   K 4.4 06/23/2014 0433   CL 102 06/23/2014 0433   CO2 30 06/23/2014 0433   GLUCOSE 101* 06/23/2014 0433   BUN 26* 06/23/2014 0433   CREATININE 1.49* 06/23/2014 0433   CALCIUM 8.2* 06/23/2014 0433   PROT 5.9* 06/22/2014 0702   ALBUMIN 2.7* 06/22/2014 0702   AST 13 06/22/2014 0702   ALT 12 06/22/2014 0702   ALKPHOS 57 06/22/2014 0702   BILITOT 1.8* 06/22/2014 0702   GFRNONAA 30* 06/23/2014 0433   GFRAA 35* 06/23/2014 0433   Assessment:  1.  Anemia, chronic for at least the past few years (and intermittently for these few years in the Hgb 8 range, which is where she currently presented). 2.  Chronically dark and hemoccult-positive stools.  Darks stools highly likely iron-related, and hemoccult-positive stool highly likely enhanced by patient's Eliquis.  Doubt any overt GI tract bleeding at the present time. 3.  Chronic atrial fibrillation  on anticoagulation. 4.  Shortness-of-breath with right upper lobe pneumonia.  Improving.  Plan:  1.  Pantoprazole can be transitioned to 40 mg po bid, now and upon discharge. 2.  Given patient's age, chronic anemia and hemoccult-positive stools, prior presentations in years past with anemia and hemoccult-positive dark stools with endoscopy and sigmoidoscopy at that time unrevealing, as well as her chronic comorbidities and acute presentation with pneumonia, I feel the risks of repeat endoscopy and/or colonoscopy far outweigh the benefits during this hospitalization. 3.  I would manage anemia expectantly, would not recommend endoscopy or colonoscopy during this admission (unless she develops overt destabilizing bleeding) 4.  OK to resume anticoagulation from GI perspective, if felt absolutely necessary as current therapy for her atrial fibrillation. 5.  Advance diet as tolerated.   6.  Patient can follow-up with [facility] with Dr. Cristina Gong (who has most recently endoscoped her) or me [phone]) as outpatient.  Will sign-off; thank you for the consultation.   Landry Dyke 06/23/2014, 1:27 PM

## 2014-06-23 NOTE — Progress Notes (Signed)
Patient Demographics  [name] [name], is a 81 y.o. female, DOB - 08/12/1923, PZW:[id number]  Admit date - 06/21/2014   Admitting Physician Toy Baker, MD  Outpatient Primary MD for the patient is [name]  LOS - 2   Chief Complaint  Patient presents with  . Dizziness      Admission history of present illness/brief narrative: 81 year old female with past medical history with past medical history of CAD, CVA, chronic A. fib on Eliquis, C. difficile colitis in the distant past, COPD presents with lightheadedness, weakness, found to have anemia, hemoglobin 7.8 from baseline 10, was Hemoccult positive, denies any melena, or bright red blood per rectum, transfused 2 units packed red blood cell 06/21/14 with good response, as well his x-ray was significant for pneumonia.  Subjective:   [name] [name] today has, No headache, No chest pain, No abdominal pain - No Nausea, No new weakness tingling or numbness, reports small cough, has baseline shortness of breath.  Assessment & Plan    Active Problems:   Atrial fibrillation   Chronic combined systolic and diastolic heart failure   Acute blood loss anemia   Coronary atherosclerosis of native coronary artery   Acute on chronic renal failure   GI bleed   CAP (community acquired pneumonia)   Symptomatic anemia  Acute blood loss anemia/GI bleed - This is most likely slow blood loss, this post 2 units packed red blood cell transfusion 4/16 with good response. - Hemoglobin is stable after transfusion, continue with oral Protonix. - Gastroneurology consult appreciated, no plan for endoscopy or colonoscopy during this hospitalization. - Risks and benefits discussed with daughter about anticoagulation, at this point the bleeding seems to be stable, baseline hemoglobin around 8, chronically dark and Hemoccult  positive stools, so we'll resume anticoagulation, and will continue to hold aspirin, they understand risks and benefits of resuming anticoagulation.  Atrial fibrillation - Rate controlled, continue with Cardizem, amiodarone. - ChadVasc >2, will resume on Eliquis please see above discussion   Community-acquired pneumonia - Continue with levofloxacin ( to finish total of 5 days)  Combined systolic and diastolic CHF - Currently appears to be euvolemic, continue with Lasix.  Coronary artery disease - Continue to hold aspirin given GI bleed, patient does not tolerate beta blockers.  History of COPD - And tinea with home medication, no active wheezing.  Code Status: DO NOT RESUSCITATE  Family Communication: Spoke with daughter over the phone  Disposition Plan: Home once stable   Procedures  None   Consults   Gastroenterology   Medications  Scheduled Meds: . allopurinol  100 mg Oral q morning - 10a  . amiodarone  200 mg Oral Daily  . atorvastatin  40 mg Oral QHS  . budesonide-formoterol  2 puff Inhalation BID  . diltiazem  120 mg Oral Q12H  . furosemide  20 mg Oral q morning - 10a  . levofloxacin (LEVAQUIN) IV  750 mg Intravenous Q48H  . montelukast  10 mg Oral QHS  . pantoprazole  40 mg Oral BID AC  . raloxifene  60 mg Oral q morning - 10a  . sodium chloride  3 mL Intravenous Q12H  . sodium chloride  3 mL Intravenous Q12H   Continuous Infusions:  PRN Meds:.sodium chloride,  acetaminophen **OR** acetaminophen, HYDROcodone-acetaminophen, levalbuterol, ondansetron **OR** ondansetron (ZOFRAN) IV, polyethylene glycol, sodium chloride  DVT Prophylaxis   SCDs ( due to active GI bleed)  Lab Results  Component Value Date   PLT 218 06/23/2014    Antibiotics   Anti-infectives    Start     Dose/Rate Route Frequency Ordered Stop   06/23/14 1200  levofloxacin (LEVAQUIN) IVPB 750 mg     750 mg 100 mL/hr over 90 Minutes Intravenous Every 48 hours 06/21/14 2305     06/21/14  1715  levofloxacin (LEVAQUIN) IVPB 750 mg     750 mg 100 mL/hr over 90 Minutes Intravenous  Once 06/21/14 1704 06/21/14 1847          Objective:   Filed Vitals:   06/23/14 0629 06/23/14 0921 06/23/14 1049 06/23/14 1326  BP: 107/56 101/52  110/51  Pulse: 90 97  93  Temp: 98 F (36.7 C)   98.1 F (36.7 C)  TempSrc: Oral   Oral  Resp: 18 18  18   Height:      Weight: 77.1 kg (169 lb 15.6 oz)     SpO2: 98% 97% 92% 96%    Wt Readings from Last 3 Encounters:  06/23/14 77.1 kg (169 lb 15.6 oz)  06/06/14 79.379 kg (175 lb)  04/28/14 78.019 kg (172 lb)     Intake/Output Summary (Last 24 hours) at 06/23/14 1407 Last data filed at 06/23/14 1300  Gross per 24 hour  Intake    830 ml  Output      0 ml  Net    830 ml     Physical Exam  Awake Alert, Oriented X 3, No new F.N deficits, Normal affect [place].AT,PERRAL Supple Neck,No JVD, No cervical lymphadenopathy appriciated.  Symmetrical Chest wall movement, Good air movement bilaterally,  Irregular,No Gallops,Rubs or new Murmurs, No Parasternal Heave +ve B.Sounds, Abd Soft, No tenderness, No organomegaly appriciated, No rebound - guarding or rigidity. No Cyanosis, Clubbing or edema, No new Rash or bruise    Data Review   Micro Results No results found for this or any previous visit (from the past 240 hour(s)).  Radiology Reports Dg Chest 2 View  06/21/2014   CLINICAL DATA:  Weakness and shortness of breath for the past 3 weeks.  EXAM: CHEST  2 VIEW  COMPARISON:  02/26/2014.  FINDINGS: Stable enlarged cardiac silhouette. Interval patchy airspace opacity in the right upper lobe. Interval minimal bilateral pleural fluid. The pulmonary vasculature and interstitial markings remain prominent. Right neck surgical clips are again demonstrated. Thoracic spine degenerative changes. Atheromatous arterial calcifications.  IMPRESSION: 1. Right upper lobe pneumonia. 2. Minimal bilateral pleural effusions. 3. Stable cardiomegaly, pulmonary  vascular congestion and chronic interstitial lung disease.   Electronically Signed   By: Claudie Revering M.D.   On: 06/21/2014 16:28    CBC  Recent Labs Lab 06/21/14 1440 06/21/14 2214 06/22/14 0858 06/23/14 0433  WBC 7.0 5.2 6.2 7.7  HGB 7.8* 7.2* 9.5* 9.0*  HCT 26.8* 23.7* 31.1* 29.5*  PLT 267 257 232 218  MCV 101.1* 99.6 97.2 95.8  MCH 29.4 30.3 29.7 29.2  MCHC 29.1* 30.4 30.5 30.5  RDW 14.6 14.9 17.3* 17.0*  LYMPHSABS 0.7  --   --   --   MONOABS 0.5  --   --   --   EOSABS 0.1  --   --   --   BASOSABS 0.0  --   --   --     Chemistries  Recent Labs Lab 06/21/14 1440 06/22/14 0702 06/23/14 0433  NA 138 139 138  K 4.6 4.8 4.4  CL 101 102 102  CO2 30 31 30   GLUCOSE 115* 92 101*  BUN 28* 24* 26*  CREATININE 1.55* 1.43* 1.49*  CALCIUM 8.9 8.4 8.2*  MG  --  2.3  --   AST  --  13  --   ALT  --  12  --   ALKPHOS  --  57  --   BILITOT  --  1.8*  --    ------------------------------------------------------------------------------------------------------------------ estimated creatinine clearance is 24.5 mL/min (by C-G formula based on Cr of 1.49). ------------------------------------------------------------------------------------------------------------------ No results for input(s): HGBA1C in the last 72 hours. ------------------------------------------------------------------------------------------------------------------ No results for input(s): CHOL, HDL, LDLCALC, TRIG, CHOLHDL, LDLDIRECT in the last 72 hours. ------------------------------------------------------------------------------------------------------------------  Recent Labs  06/22/14 0702  TSH 1.315   ------------------------------------------------------------------------------------------------------------------  Recent Labs  06/21/14 2214  VITAMINB12 295  FOLATE 13.5  FERRITIN 32  TIBC 283  IRON 19*  RETICCTPCT 4.9*    Coagulation profile No results for input(s): INR, PROTIME in the  last 168 hours.  No results for input(s): DDIMER in the last 72 hours.  Cardiac Enzymes  Recent Labs Lab 06/22/14 0702 06/22/14 1456 06/22/14 2105  TROPONINI <0.03 <0.03 <0.03   ------------------------------------------------------------------------------------------------------------------ Invalid input(s): POCBNP     Time Spent in minutes   30 minutes   ELGERGAWY, DAWOOD M.D on 06/23/2014 at 2:07 PM  Between 7am to 7pm - Pager - [phone]  After 7pm go to [email or web address] - password [email or web address]  And look for the night coverage person covering for me after hours  [facility] Office  [phone]   **Disclaimer: This note may have been dictated with voice recognition software. Similar sounding words can inadvertently be transcribed and this note may contain transcription errors which may not have been corrected upon publication of note.**

## 2014-06-24 ENCOUNTER — Inpatient Hospital Stay (HOSPITAL_COMMUNITY): Payer: Medicare Other

## 2014-06-24 LAB — CBC
HCT: 30.6 % — ABNORMAL LOW (ref 36.0–46.0)
Hemoglobin: 9.2 g/dL — ABNORMAL LOW (ref 12.0–15.0)
MCH: 29.2 pg (ref 26.0–34.0)
MCHC: 30.1 g/dL (ref 30.0–36.0)
MCV: 97.1 fL (ref 78.0–100.0)
Platelets: 249 10*3/uL (ref 150–400)
RBC: 3.15 MIL/uL — AB (ref 3.87–5.11)
RDW: 16.5 % — ABNORMAL HIGH (ref 11.5–15.5)
WBC: 6.4 10*3/uL (ref 4.0–10.5)

## 2014-06-24 LAB — BASIC METABOLIC PANEL
Anion gap: 6 (ref 5–15)
BUN: 24 mg/dL — ABNORMAL HIGH (ref 6–23)
CALCIUM: 8.5 mg/dL (ref 8.4–10.5)
CO2: 31 mmol/L (ref 19–32)
CREATININE: 1.33 mg/dL — AB (ref 0.50–1.10)
Chloride: 104 mmol/L (ref 96–112)
GFR, EST AFRICAN AMERICAN: 40 mL/min — AB (ref >90)
GFR, EST NON AFRICAN AMERICAN: 35 mL/min — AB (ref >90)
Glucose, Bld: 104 mg/dL — ABNORMAL HIGH (ref 70–99)
POTASSIUM: 4.1 mmol/L (ref 3.5–5.1)
SODIUM: 141 mmol/L (ref 135–145)

## 2014-06-24 MED ORDER — FUROSEMIDE 10 MG/ML IJ SOLN
20.0000 mg | Freq: Once | INTRAMUSCULAR | Status: AC
  Administered 2014-06-24: 20 mg via INTRAVENOUS
  Filled 2014-06-24: qty 2

## 2014-06-24 MED ORDER — APIXABAN 2.5 MG PO TABS
2.5000 mg | ORAL_TABLET | Freq: Two times a day (BID) | ORAL | Status: DC
  Administered 2014-06-24: 2.5 mg via ORAL
  Filled 2014-06-24: qty 1

## 2014-06-24 MED ORDER — FUROSEMIDE 20 MG PO TABS: 20.0000 mg | ORAL_TABLET | Freq: Every morning | ORAL | Status: DC

## 2014-06-24 MED ORDER — LEVOFLOXACIN 750 MG PO TABS: 750.0000 mg | ORAL_TABLET | ORAL | Status: AC

## 2014-06-24 NOTE — Progress Notes (Signed)
D/C instructions reviewed w/ pt and dtr. Both verbalize understanding and all questions answered. Pt d/c in w/c in stable condition by NT to dtr's car. Pt in possession of d/c instructions and all personal belongings.

## 2014-06-24 NOTE — Progress Notes (Signed)
Spoke with pt's daughter [name] who selected [place] for [facility] needs. Pt going to daughter's home [place], [phone]. Referral given to in house rep.

## 2014-06-24 NOTE — Progress Notes (Signed)
ANTIBIOTIC CONSULT NOTE - FOLLOW UP  Pharmacy Consult for Levaquin Indication: pneumonia  Allergies  Allergen Reactions  . Baby Powder [Methylbenzethonium] Shortness Of Breath  . Demerol [Meperidine] Hives  . Penicillins Hives  . Spiriva Handihaler [Tiotropium Bromide Monohydrate]     Caused chest pains    Patient Measurements: Height: 5\' 1"  (154.9 cm) Weight: 171 lb 15.3 oz (78 kg) IBW/kg (Calculated) : 47.8  Vital Signs: Temp: 98 F (36.7 C) (04/19 0517) Temp Source: Oral (04/19 0517) BP: 93/44 mmHg (04/19 0923) Pulse Rate: 107 (04/19 0923) Intake/Output from previous day: 04/18 0701 - 04/19 0700 In: 750 [P.O.:600; IV Piggyback:150] Out: 850 [Urine:850] Intake/Output from this shift: Total I/O In: 300 [P.O.:300] Out: 200 [Urine:200]  Labs:  Recent Labs  06/22/14 0702 06/22/14 0858 06/23/14 0433 06/24/14 0459  WBC  --  6.2 7.7 6.4  HGB  --  9.5* 9.0* 9.2*  PLT  --  232 218 249  CREATININE 1.43*  --  1.49* 1.33*   Estimated Creatinine Clearance: 27.6 mL/min (by C-G formula based on Cr of 1.33). No results for input(s): VANCOTROUGH, VANCOPEAK, VANCORANDOM, GENTTROUGH, GENTPEAK, GENTRANDOM, TOBRATROUGH, TOBRAPEAK, TOBRARND, AMIKACINPEAK, AMIKACINTROU, AMIKACIN in the last 72 hours.   Microbiology: No results found for this or any previous visit (from the past 720 hour(s)).  Anti-infectives    Start     Dose/Rate Route Frequency Ordered Stop   06/23/14 1200  levofloxacin (LEVAQUIN) IVPB 750 mg     750 mg 100 mL/hr over 90 Minutes Intravenous Every 48 hours 06/21/14 2305     06/21/14 1715  levofloxacin (LEVAQUIN) IVPB 750 mg     750 mg 100 mL/hr over 90 Minutes Intravenous  Once 06/21/14 1704 06/21/14 1847      Assessment: 47 yoF admitted 4/16 with suspected GIB and found to have pneumonia on CXR.  Pharmacy is consulted to dose Levaquin.  4/16 >> Levaquin >>  Today, 06/24/2014:  Afebrile  WBC: remain WNL  Renal: SCr 1.33, CrCl ~ 27 ml/min  No  micro data.  Goal of Therapy:  Appropriate abx dosing, eradication of infection.   Plan:   Continue Levaquin 750mg  IV q48h.  Follow up renal fxn, culture results, and clinical course.  MD, can Levaquin be changed from IV to PO when patient is tolerating oral diet?  Gretta Arab PharmD, BCPS Pager [phone] 06/24/2014 11:51 AM

## 2014-06-24 NOTE — Evaluation (Signed)
Physical Therapy Evaluation Patient Details Name: Sahara Fujimoto MRN: [id number] DOB: 08-07-1923 Today's Date: 06/24/2014   History of Present Illness  81 yo female admitted with GI bleed, Afib, Pna. Hx of CVA, Afib, CAD. Pt lives alone in senior apartments  Clinical Impression  On eval, pt was supervision level assist for mobility-able to ambulate ~150 feet with RW. Attempted to get O2 sats reading on RA but readings fluctuated between 80-90% so unsure of accuracy. Pt does have O2 available to her at home.     Follow Up Recommendations Home health PT    Equipment Recommendations  None recommended by PT    Recommendations for Other Services       Precautions / Restrictions Restrictions Weight Bearing Restrictions: No      Mobility  Bed Mobility               General bed mobility comments: pt oob in recliner  Transfers Overall transfer level: Needs assistance   Transfers: Sit to/from Stand Sit to Stand: Modified independent (Device/Increase time)            Ambulation/Gait Ambulation/Gait assistance: Supervision Ambulation Distance (Feet): 150 Feet Assistive device: Rolling walker (2 wheeled) Gait Pattern/deviations: Step-through pattern     General Gait Details: supervision for safety. Pt tolerated activity well. difficulty getting accurate O2 sat reading-fluctuating between 80-90% on RA. dyspnea 2/4  Stairs            Wheelchair Mobility    Modified Rankin (Stroke Patients Only)       Balance                                             Pertinent Vitals/Pain Pain Assessment: No/denies pain    Home Living Family/patient expects to be discharged to:: Private residence Living Arrangements: Alone Available Help at Discharge: Personal care attendant ([facility] [employer] 2x/week) Type of Home: Apartment Home Access: Level entry     Home Layout: One level Home Equipment: [place] - 4 wheels;Wheelchair - [employer] - 2 wheels       Prior Function Level of Independence: Independent with assistive device(s)         Comments: wears o2 at night     Hand Dominance        Extremity/Trunk Assessment   Upper Extremity Assessment: Overall WFL for tasks assessed           Lower Extremity Assessment: Generalized weakness      Cervical / Trunk Assessment: Normal  Communication   Communication: No difficulties  Cognition Arousal/Alertness: Awake/alert Behavior During Therapy: WFL for tasks assessed/performed Overall Cognitive Status: Within Functional Limits for tasks assessed                      General Comments      Exercises        Assessment/Plan    PT Assessment Patient needs continued PT services  PT Diagnosis Difficulty walking;Generalized weakness   PT Problem List Decreased mobility  PT Treatment Interventions Gait training;Functional mobility training;Therapeutic activities;Therapeutic exercise;Patient/family education   PT Goals (Current goals can be found in the Care Plan section) Acute Rehab PT Goals Patient Stated Goal: home soon PT Goal Formulation: With patient/family Time For Goal Achievement: 07/08/14 Potential to Achieve Goals: Good    Frequency Min 3X/week   Barriers to discharge  Co-evaluation               End of Session   Activity Tolerance: Patient tolerated treatment well Patient left: in chair;with call bell/phone within reach;with family/visitor present           Time: 1356-1410 PT Time Calculation (min) (ACUTE ONLY): 14 min   Charges:   PT Evaluation $Initial PT Evaluation Tier I: 1 Procedure     PT G Codes:        Weston Anna, MPT Pager: [phone]

## 2014-06-24 NOTE — Discharge Instructions (Signed)
Follow with Primary MD Carlos Levering, PA-C in 4 days   Get CBC, CMP, 2 view Chest X ray checked  by Primary MD next visit.    Activity: As tolerated with Full fall precautions use walker/cane & assistance as needed   Disposition Home with home care   Diet: Heart Healthy , with feeding assistance and aspiration precautions.  For Heart failure patients - Check your Weight same time everyday, if you gain over 2 pounds, or you develop in leg swelling, experience more shortness of breath or chest pain, call your Primary MD immediately. Follow Cardiac Low Salt Diet and 1.5 lit/day fluid restriction.   On your next visit with your primary care physician please Get Medicines reviewed and adjusted.   Please request your Prim.MD to go over all Hospital Tests and Procedure/Radiological results at the follow up, please get all Hospital records sent to your Prim MD by signing hospital release before you go home.   If you experience worsening of your admission symptoms, develop shortness of breath, life threatening emergency, suicidal or homicidal thoughts you must seek medical attention immediately by calling 911 or calling your MD immediately  if symptoms less severe.  You Must read complete instructions/literature along with all the possible adverse reactions/side effects for all the Medicines you take and that have been prescribed to you. Take any new Medicines after you have completely understood and accpet all the possible adverse reactions/side effects.   Do not drive, operating heavy machinery, perform activities at heights, swimming or participation in water activities or provide baby sitting services if your were admitted for syncope or siezures until you have seen by Primary MD or a Neurologist and advised to do so again.  Do not drive when taking Pain medications.    Do not take more than prescribed Pain, Sleep and Anxiety Medications  Special Instructions: If you have smoked or  chewed Tobacco  in the last 2 yrs please stop smoking, stop any regular Alcohol  and or any Recreational drug use.  Wear Seat belts while driving.   Please note  You were cared for by a hospitalist during your hospital stay. If you have any questions about your discharge medications or the care you received while you were in the hospital after you are discharged, you can call the unit and asked to speak with the hospitalist on call if the hospitalist that took care of you is not available. Once you are discharged, your primary care physician will handle any further medical issues. Please note that NO REFILLS for any discharge medications will be authorized once you are discharged, as it is imperative that you return to your primary care physician (or establish a relationship with a primary care physician if you do not have one) for your aftercare needs so that they can reassess your need for medications and monitor your lab values.

## 2014-06-24 NOTE — Discharge Summary (Signed)
[name] [name], 81 y.o., DOB 1924-02-10, MRN [id number]. Admission date: 06/21/2014 Discharge Date 06/24/2014 Primary MD [name] Admitting Physician Toy Baker, MD    PCP please follow-up: - Check CBC, BMP during next visit, to check on hemoglobin if remains stable, as well on creatinine remained stable on Isordil dose of Lasix at home . - Patient will need oxygen 2 L daytime for the next 40 hours till she is appropriately diuresed .  Admission Diagnosis  Oxygen dependent [Z99.81] CAP (community acquired pneumonia) [J18.9] Symptomatic anemia [D64.9] Gastrointestinal hemorrhage, unspecified gastritis, unspecified gastrointestinal hemorrhage type [K92.2]  Discharge Diagnosis   Active Problems:   Atrial fibrillation   Chronic combined systolic and diastolic heart failure   Acute blood loss anemia   Coronary atherosclerosis of native coronary artery   Acute on chronic renal failure   GI bleed   CAP (community acquired pneumonia)   Symptomatic anemia      Past Medical History  Diagnosis Date  . Hypertension   . Hyperlipidemia   . COPD (chronic obstructive pulmonary disease)   . Osteoporosis   . H/O hiatal hernia   . Chronic back pain   . CAD (coronary artery disease)     LHC 9/05:  pLAD less than 20%, D1 20-30%, ostial RCA 40-50%, proximal RCA 20%, EF 60%.  . C. difficile diarrhea   . Gout   . Atrial fibrillation     a. failed DCCV => b. amiodarone added => converted to NSR on Amiodarone;  c. Xarelto d/c'd 2/2 falling (wrist and hip Fx in 1/14)  . Carotid stenosis     s/p bilat CEA  . C. difficile colitis     10/2011;  11/2011  . Salmonella enteritis 08/2011    c/b septic shock  . Hx of echocardiogram     a. Echocardiogram 09/29/11: Mild LVH, EF 45-50%, diffuse HK, mild to moderate aortic stenosis, mean gradient 12 mmHg, moderate MAC, mild MR, moderate LAE, moderate RAE, question small secundum ASD with left to right flow not seen in 4 chamber view, PASP 35-39  (mild pulmonary hypertension). ;  b.  TEE on 10/04/11: Mild LVH, EF 55%, mild MR, no defect or PFO  . Pneumonia 11/2101  . PONV (postoperative nausea and vomiting)   . CHF (congestive heart failure)   . Chronic kidney disease   . GERD (gastroesophageal reflux disease)   . Hepatitis   . Anemia   . Hx of echocardiogram     Echo (09/2013): EF 55%, normal wall motion, mild aortic stenosis (mean 12 mm Hg), MAC, mild MR, mild LAE, mild PI, PASP 30 mm Hg  . Hx of cardiovascular stress test     Lexiscan Myoview (7/15):  No ischemia or infarct, EF 50%, Low Risk  . Arthritis     Neck  . PAF (paroxysmal atrial fibrillation)     Past Surgical History  Procedure Laterality Date  . Carotid endarterectomy      bilateral  . Cataract extraction, bilateral    . Joint replacement  2008    Right Total Knee  . Anal fistula repair    . Left parotidectomy    . Eye surgery    . Flexible sigmoidoscopy  09/03/2011    Procedure: FLEXIBLE SIGMOIDOSCOPY;  Surgeon: Beryle Beams, MD;  Location: [facility];  Service: Endoscopy;  Laterality: N/A;  . Tee without cardioversion  10/04/2011    Procedure: TRANSESOPHAGEAL ECHOCARDIOGRAM (TEE);  Surgeon: Larey Dresser, MD;  Location: [place];  Service: Cardiovascular;  Laterality: N/A;  to be carelinked here by 1230-verified 7/29/dl  . Cardioversion  10/04/2011    Procedure: CARDIOVERSION;  Surgeon: Larey Dresser, MD;  Location: [facility]COPY;  Service: Cardiovascular;  Laterality: N/A;  . Esophagogastroduodenoscopy  11/29/2011    Procedure: ESOPHAGOGASTRODUODENOSCOPY (EGD);  Surgeon: Cleotis Nipper, MD;  Location: [facility];  Service: Endoscopy;  Laterality: N/A;  recent h/o resp failure/pneumonia  . Left hip fracture repair  03/14/12  . Open reduction internal fixation (orif) scaphoid with distal radius graft  03/14/2012    Procedure: OPEN REDUCTION INTERNAL FIXATION (ORIF) SCAPHOID WITH DISTAL RADIUS GRAFT;  Surgeon: Jolyn Nap, MD;  Location: [facility];   Service: Orthopedics;  Laterality: Left;  DVR wrist fracture set/ hand innovation  . Hip pinning,cannulated  03/14/2012    Procedure: CANNULATED HIP PINNING;  Surgeon: Jolyn Nap, MD;  Location: [facility];  Service: Orthopedics;  Laterality: Left;  Biomet 6.5 cannulated screws  . Femur im nail Left 04/13/2012    Procedure: INTRAMEDULLARY (IM) NAIL FEMORAL;  Surgeon: Jolyn Nap, MD;  Location: [place];  Service: Orthopedics;  Laterality: Left;  OPEN REDUCTION INTERNAL FIXATION LEFT PROXIMAL FEMUR Fracture      Hospital Course See H&P, Labs, Consult and Test reports for all details in brief, patient was admitted for **  Active Problems:   Atrial fibrillation   Chronic combined systolic and diastolic heart failure   Acute blood loss anemia   Coronary atherosclerosis of native coronary artery   Acute on chronic renal failure   GI bleed   CAP (community acquired pneumonia)   Symptomatic anemia  Admission history of present illness/brief narrative: 81 year old female with past medical history with past medical history of CAD, CVA, chronic A. fib on Eliquis, C. difficile colitis in the distant past, COPD presents with lightheadedness, weakness, found to have anemia, hemoglobin 7.8 from baseline 10, was Hemoccult positive, denies any melena, or bright red blood per rectum, transfused 2 units packed red blood cell 06/21/14 with good response, as well his x-ray was significant for pneumonia, as well patient remained to require oxygen during hospital stay, repeat chest x-ray 4/19 showing mild volume overload , so patient was given IV Lasix, instructed to continue with  Lasix twice a day for the next 48 hours .   chronicblood loss anemia/GI bleed - This is most likely slow blood loss, post 2 units packed red blood cell transfusion 4/16 with good response. - Hemoglobin is stable after transfusion - Gastroneurology consult appreciated, no plan for endoscopy or colonoscopy during this  hospitalization. - Risks and benefits discussed with daughter about anticoagulation, at this point the bleeding seems to be stable, baseline hemoglobin around 8, chronically dark and Hemoccult positive stools, so we'll resume anticoagulation, and will continue to hold aspirin aspirin on discharge, they understand risks and benefits of resuming anticoagulation. - Continue with PPI on discharge, patient will follow with PCP by this Friday for repeat CBC.  Atrial fibrillation - Rate controlled, continue with Cardizem, amiodarone. - ChadVasc >2, will resume on Eliquis please see above discussion  Community-acquired pneumonia - Continue with levofloxacin to finish another 2 doses as outpatient (total of 4 days )  Combined systolic and diastolic CHF - Evidence of mild volume overload on today's x-ray, received IV Lasix, instructed to take Lasix twice a day for the next 48 hours .  coronary artery disease Will hold aspirin given GI bleed, patient does not tolerate beta blockers.  History of COPD -  continueth  home medication, no active wheezing.  Consults   Gastroenterology  Significant Tests:  See full reports for all details    Dg Chest 2 View  06/24/2014   CLINICAL DATA:  Hypoxia.  Initial encounter.  EXAM: CHEST  2 VIEW  COMPARISON:  06/21/2014.  FINDINGS: Cardiomegaly is present. Pulmonary vascular congestion and interstitial pulmonary edema. RIGHT upper lobe airspace disease appears little changed compared to recent prior exam 06/21/2014. There is also airspace disease at the LEFT lung base. Blunting of both costophrenic angles compatible with small bilateral pleural effusions.  IMPRESSION: 1. Cardiomegaly with pulmonary of vascular congestion and interstitial pulmonary edema compatible with mild CHF. 2. Persistent RIGHT upper lobe and LEFT basilar opacity favored to represent asymmetric pulmonary edema over pneumonia. This is correlated with laboratory results showing normal white blood  cell count. 3. Small bilateral pleural effusions.   Electronically Signed   By: Dereck Ligas M.D.   On: 06/24/2014 10:41   Dg Chest 2 View  06/21/2014   CLINICAL DATA:  Weakness and shortness of breath for the past 3 weeks.  EXAM: CHEST  2 VIEW  COMPARISON:  02/26/2014.  FINDINGS: Stable enlarged cardiac silhouette. Interval patchy airspace opacity in the right upper lobe. Interval minimal bilateral pleural fluid. The pulmonary vasculature and interstitial markings remain prominent. Right neck surgical clips are again demonstrated. Thoracic spine degenerative changes. Atheromatous arterial calcifications.  IMPRESSION: 1. Right upper lobe pneumonia. 2. Minimal bilateral pleural effusions. 3. Stable cardiomegaly, pulmonary vascular congestion and chronic interstitial lung disease.   Electronically Signed   By: Claudie Revering M.D.   On: 06/21/2014 16:28     Today   Subjective:   [name] [name] today has no headache,no chest abdominal pain,no new weakness tingling or numbness, feels much better wants to go home today.   Objective:   Blood pressure 93/44, pulse 107, temperature 98 F (36.7 C), temperature source Oral, resp. rate 20, height 5\' 1"  (1.549 m), weight 78 kg (171 lb 15.3 oz), SpO2 98 %.  Intake/Output Summary (Last 24 hours) at 06/24/14 1555 Last data filed at 06/24/14 1004  Gross per 24 hour  Intake    300 ml  Output   1250 ml  Net   -950 ml    Exam Awake Alert, Oriented X 3, No new F.N deficits, Normal affect [place].AT,PERRAL Supple Neck,No JVD, No cervical lymphadenopathy appriciated.  Symmetrical Chest wall movement, Good air movement bilaterally,  Irregular,No Gallops,Rubs or new Murmurs, No Parasternal Heave +ve B.Sounds, Abd Soft, No tenderness, No organomegaly appriciated, No rebound - guarding or rigidity. No Cyanosis, Clubbing or edema, No new Rash or bruise   Data Review   Cultures -   CBC w Diff: Lab Results  Component Value Date   WBC 6.4 06/24/2014   HGB  9.2* 06/24/2014   HCT 30.6* 06/24/2014   PLT 249 06/24/2014   LYMPHOPCT 11* 06/21/2014   MONOPCT 7 06/21/2014   EOSPCT 2 06/21/2014   BASOPCT 0 06/21/2014   CMP: Lab Results  Component Value Date   NA 141 06/24/2014   K 4.1 06/24/2014   CL 104 06/24/2014   CO2 31 06/24/2014   BUN 24* 06/24/2014   CREATININE 1.33* 06/24/2014   PROT 5.9* 06/22/2014   ALBUMIN 2.7* 06/22/2014   BILITOT 1.8* 06/22/2014   ALKPHOS 57 06/22/2014   AST 13 06/22/2014   ALT 12 06/22/2014  .  Micro Results No results found for this or any previous visit (from the past 240 hour(s)).  Discharge Instructions     He is take Lasix 20 mg oral twice a day for next 48 hours, follow with your PCP for repeat labs by Friday , continue to use oxygen 2 L daytime for the next 48 hours, then go back to 2 L at bedtime . Follow-up Information    Schedule an appointment as soon as possible for a visit with WILLARD,JENNIFER, PA-C.   Specialty:  Family Medicine   Why:  Posthospitalization follow-up, check labs including CBC, BMP.   Contact information:   [address] [place] [address] [phone]       Discharge Medications     Medication List    STOP taking these medications        aspirin 325 MG tablet      TAKE these medications        allopurinol 100 MG tablet  Commonly known as:  ZYLOPRIM  Take 100 mg by mouth every morning.     amiodarone 200 MG tablet  Commonly known as:  PACERONE  Take 1 tablet (200 mg total) by mouth daily.     apixaban 2.5 MG Tabs tablet  Commonly known as:  ELIQUIS  Take 1 tablet (2.5 mg total) by mouth 2 (two) times daily.     atorvastatin 40 MG tablet  Commonly known as:  LIPITOR  Take 40 mg by mouth at bedtime.     budesonide-formoterol 160-4.5 MCG/ACT inhaler  Commonly known as:  SYMBICORT  Inhale 2 puffs into the lungs 2 (two) times daily.     cholecalciferol 1000 UNITS tablet  Commonly known as:  VITAMIN D  Take 1,000-2,000 Units  by mouth 2 (two) times daily. Take 2000 units in the morning and 1000 units in the evening     diltiazem 120 MG 24 hr capsule  Commonly known as:  CARDIZEM CD  Take 240 mg by mouth daily.     ferrous sulfate 325 (65 FE) MG tablet  Take 325 mg by mouth 2 (two) times daily with a meal.     fish oil-omega-3 fatty acids 1000 MG capsule  Take 1 g by mouth 2 (two) times daily.     FLAX SEED OIL PO  Take 1 capsule by mouth 2 (two) times daily.     furosemide 20 MG tablet  Commonly known as:  LASIX  Take 1 tablet (20 mg total) by mouth every morning. Please take 20 mg of oral Lasix 2 times daily for the next 2 days, and on Friday go back to 20 mg oral daily.     levalbuterol 45 MCG/ACT inhaler  Commonly known as:  XOPENEX HFA  Inhale 2 puffs into the lungs every 6 (six) hours as needed for wheezing.     levofloxacin 750 MG tablet  Commonly known as:  LEVAQUIN  Take 1 tablet (750 mg total) by mouth every other day.  Start taking on:  06/25/2014     meclizine 25 MG tablet  Commonly known as:  ANTIVERT  Take 1 tablet (25 mg total) by mouth 2 (two) times daily as needed for dizziness.     montelukast 10 MG tablet  Commonly known as:  SINGULAIR  Take 10 mg by mouth at bedtime.     omeprazole 40 MG capsule  Commonly known as:  PRILOSEC  Take 40 mg by mouth every evening.     polyethylene glycol packet  Commonly known as:  MIRALAX / GLYCOLAX  Take 17 g by mouth daily  as needed for mild constipation.     raloxifene 60 MG tablet  Commonly known as:  EVISTA  Take 60 mg by mouth every morning.         Total Time in preparing paper work, data evaluation and todays exam - 35 minutes  Mitchelle Goerner M.D on 06/24/2014 at 3:55 PM  [place]  [phone]

## 2014-07-07 ENCOUNTER — Telehealth: Payer: Self-pay | Admitting: Cardiovascular Disease

## 2014-07-07 NOTE — Telephone Encounter (Signed)
Spoke with pt's home health nurse [facility]). She reports she saw pt this AM for first time and noted 2+ edema up to lower thigh.  Previous home health visits note edema in feet and ankles.  Beth reports pt complaining of shortness of breath this AM which improved some after using inhaler. Weight today is 170 lbs and was 170 lbs when last seen by home health on 4/29.  Oxygen sats 89-91% on room air.  Does use oxygen at 3L at night. Recently hospitalized for GI bleed and heart failure.  Has seen primary care since discharge but these records are not in EPIC. Pt currently taking furosemide 20 mg by mouth daily.  Will forward to Dr. Angelena Form for recommendations.  Any changes to be called to home health nurse and pt's daughter.

## 2014-07-07 NOTE — Telephone Encounter (Signed)
Left message for [name] [name] to call office.

## 2014-07-07 NOTE — Telephone Encounter (Signed)
Spoke with Beth and gave her instructions from Dr. Angelena Form.  She will draw BMP on Monday.  I also spoke with pt's daughter [name]) and gave her these instructions.

## 2014-07-07 NOTE — Telephone Encounter (Signed)
I would have her increase her Lasix to 40 mg daily for the next 4 days and if she starts to lose weight, we can continue this dose. If her weight stays the same and edema persists then we will need to see her back and adjust dosing. She will need a BMET in one week. cdm

## 2014-07-07 NOTE — Telephone Encounter (Signed)
New message      Pt c/o Shortness Of Breath: STAT if SOB developed within the last 24 hours or pt is noticeably SOB on the phone  1. Are you currently SOB (can you hear that pt is SOB on the phone)?  Home health nurse says she is not sob when talking 2. How long have you been experiencing SOB?  SOB started this am and pt always has some swelling  3. Are you SOB when sitting or when up moving around? Move around  4. Are you currently experiencing any other symptoms? Pt had to use her inhaler and she is ok now, she also has 2+ edema both legs ( from feet to thighs).  If you need to call the patient, her number is [phone]

## 2014-07-15 ENCOUNTER — Encounter: Payer: Self-pay | Admitting: Cardiovascular Disease

## 2014-07-15 ENCOUNTER — Telehealth: Payer: Self-pay | Admitting: Cardiovascular Disease

## 2014-07-15 NOTE — Telephone Encounter (Signed)
Agree. Would continue current dose of Lasix. cdm

## 2014-07-15 NOTE — Telephone Encounter (Signed)
New message   [facility] calling    Call last week - a lot of new swelling in feet/ ankles   Lasix was increase to  40 mg   1. Clarification on lasix 20 mg  Or 40 mg   2. Lab work was drawn on yesterday - cret  1.34 . Other lab were normal   Pt c/o swelling: STAT is pt has developed SOB within 24 hours  1. How long have you been experiencing swelling?   2. Where is the swelling located? Feet / ankles better from last week still better.    3.  Are you currently taking a "fluid pill"? Lasix 40 mg since last week    4.  Are you currently SOB? No   5.  Have you traveled recently? No

## 2014-07-15 NOTE — Telephone Encounter (Signed)
Spoke with Beth from [facility]. She reports pt's swelling has improved but not completely gone. Swelling is no longer in thighs. Is below knees with 1+-2+ edema in feet and ankles. Weight down 3 lbs. Breathing back to baseline. Labs from yesterday scanned in.  Pt has been taking increased dose of Lasix 40 mg daily.  I told Beth pt should continue Lasix 40 mg daily and I would call her back if Dr. Angelena Form had other recommendations.

## 2014-07-17 ENCOUNTER — Telehealth: Payer: Self-pay | Admitting: Cardiovascular Disease

## 2014-07-17 NOTE — Telephone Encounter (Signed)
New Prob  Representative went out to see pt today and wanted to make office aware of her status: States Lasix dosage was recently increased last week. She reports much improvement of lower extremity swelling and 3 pound weight loss. However, she states pts blood pressure was 94/50 today with a pulse of 84. Pt mentioned an episode of lightheadedness along with weakness which lasted for only a few minutes and resolved on its own. No chest pain or SOB associated with episode.

## 2014-07-18 NOTE — Telephone Encounter (Signed)
I would continue to follow her BP closely. If it remains low and she is dizzy then we will need to lower Lasix dose back down to the previous dose. Gerald Stabs

## 2014-07-18 NOTE — Telephone Encounter (Signed)
I spoke with [facility] and gave her instructions from Dr. Angelena Form.

## 2014-07-25 ENCOUNTER — Telehealth: Payer: Self-pay | Admitting: Cardiovascular Disease

## 2014-07-25 MED ORDER — FUROSEMIDE 20 MG PO TABS: 20.0000 mg | ORAL_TABLET | Freq: Every day | ORAL | Status: AC

## 2014-07-25 NOTE — Telephone Encounter (Signed)
Agree. thanks

## 2014-07-25 NOTE — Telephone Encounter (Signed)
Spoke with [employer] (pt's home health nurse). She reports blood pressure readings as below.  Last several days BP in the AM has been upper 90's to low 917'H systolic.  Pt complaining of dizziness and feeling weak this AM.  Weight stable at 159 lbs. Swelling better.  Lasix was recently increased to 40 mg daily due to weight gain and swelling.  Last phone note indicates will need to go back to previous dose of lasix if BP low or dizziness.  I told Beth pt should decrease Lasix to 20 mg daily (previous dose) and to continue to monitor BP and weights.  Pt does not need new prescription.  Beth will let us know if pt continues to have dizziness or low BP. I told her I would call her if Dr. Angelena Form had any other recommendations.

## 2014-07-25 NOTE — Telephone Encounter (Signed)
New message    [facility]    Receive a call from patient has a nurse aide is there now.    Pt c/o BP issue: STAT if pt c/o blurred vision, one-sided weakness or slurred speech  1. What are your last 5 BP readings? Today 86/68 pulse 84  / yesterday  110/48 by [facility].    2. Are you having any other symptoms (ex. Dizziness, headache, blurred vision, passed out)? Weak, dizziness early, am medicaiton was taken early   3. What is your BP issue? Please advise

## 2014-09-12 ENCOUNTER — Emergency Department (HOSPITAL_COMMUNITY): Payer: Medicare Other

## 2014-09-12 ENCOUNTER — Encounter (HOSPITAL_COMMUNITY): Payer: Self-pay

## 2014-09-12 ENCOUNTER — Emergency Department (HOSPITAL_COMMUNITY)
Admission: EM | Admit: 2014-09-12 | Discharge: 2014-09-12 | Disposition: A | Discharge status: Home / Self Care | Payer: Medicare Other | Attending: Emergency Medicine | Admitting: Emergency Medicine

## 2014-09-12 DIAGNOSIS — Z880 Allergy status to penicillin: Secondary | ICD-10-CM | POA: Insufficient documentation

## 2014-09-12 DIAGNOSIS — Z7901 Long term (current) use of anticoagulants: Secondary | ICD-10-CM | POA: Diagnosis not present

## 2014-09-12 DIAGNOSIS — D649 Anemia, unspecified: Secondary | ICD-10-CM | POA: Diagnosis not present

## 2014-09-12 DIAGNOSIS — R188 Other ascites: Secondary | ICD-10-CM | POA: Diagnosis not present

## 2014-09-12 DIAGNOSIS — E785 Hyperlipidemia, unspecified: Secondary | ICD-10-CM | POA: Diagnosis not present

## 2014-09-12 DIAGNOSIS — J449 Chronic obstructive pulmonary disease, unspecified: Secondary | ICD-10-CM | POA: Diagnosis not present

## 2014-09-12 DIAGNOSIS — R2240 Localized swelling, mass and lump, unspecified lower limb: Secondary | ICD-10-CM | POA: Insufficient documentation

## 2014-09-12 DIAGNOSIS — N189 Chronic kidney disease, unspecified: Secondary | ICD-10-CM | POA: Diagnosis not present

## 2014-09-12 DIAGNOSIS — R112 Nausea with vomiting, unspecified: Secondary | ICD-10-CM | POA: Insufficient documentation

## 2014-09-12 DIAGNOSIS — Z9889 Other specified postprocedural states: Secondary | ICD-10-CM | POA: Diagnosis not present

## 2014-09-12 DIAGNOSIS — Z79899 Other long term (current) drug therapy: Secondary | ICD-10-CM | POA: Insufficient documentation

## 2014-09-12 DIAGNOSIS — Z8719 Personal history of other diseases of the digestive system: Secondary | ICD-10-CM | POA: Diagnosis not present

## 2014-09-12 DIAGNOSIS — I129 Hypertensive chronic kidney disease with stage 1 through stage 4 chronic kidney disease, or unspecified chronic kidney disease: Secondary | ICD-10-CM | POA: Insufficient documentation

## 2014-09-12 DIAGNOSIS — I4891 Unspecified atrial fibrillation: Secondary | ICD-10-CM | POA: Insufficient documentation

## 2014-09-12 DIAGNOSIS — M1990 Unspecified osteoarthritis, unspecified site: Secondary | ICD-10-CM | POA: Diagnosis not present

## 2014-09-12 DIAGNOSIS — M109 Gout, unspecified: Secondary | ICD-10-CM | POA: Insufficient documentation

## 2014-09-12 DIAGNOSIS — Z8701 Personal history of pneumonia (recurrent): Secondary | ICD-10-CM | POA: Insufficient documentation

## 2014-09-12 DIAGNOSIS — I2510 Atherosclerotic heart disease of native coronary artery without angina pectoris: Secondary | ICD-10-CM | POA: Insufficient documentation

## 2014-09-12 DIAGNOSIS — M810 Age-related osteoporosis without current pathological fracture: Secondary | ICD-10-CM | POA: Insufficient documentation

## 2014-09-12 DIAGNOSIS — R140 Abdominal distension (gaseous): Secondary | ICD-10-CM | POA: Diagnosis present

## 2014-09-12 DIAGNOSIS — G8929 Other chronic pain: Secondary | ICD-10-CM | POA: Diagnosis not present

## 2014-09-12 DIAGNOSIS — Z87891 Personal history of nicotine dependence: Secondary | ICD-10-CM | POA: Diagnosis not present

## 2014-09-12 LAB — COMPREHENSIVE METABOLIC PANEL
ALBUMIN: 2.5 g/dL — AB (ref 3.5–5.0)
ALK PHOS: 65 U/L (ref 38–126)
ALT: 15 U/L (ref 14–54)
ANION GAP: 8 (ref 5–15)
AST: 24 U/L (ref 15–41)
BILIRUBIN TOTAL: 0.4 mg/dL (ref 0.3–1.2)
BUN: 24 mg/dL — AB (ref 6–20)
CO2: 27 mmol/L (ref 22–32)
Calcium: 8.4 mg/dL — ABNORMAL LOW (ref 8.9–10.3)
Chloride: 103 mmol/L (ref 101–111)
Creatinine, Ser: 1.67 mg/dL — ABNORMAL HIGH (ref 0.44–1.00)
GFR calc Af Amer: 30 mL/min — ABNORMAL LOW (ref >60)
GFR calc non Af Amer: 26 mL/min — ABNORMAL LOW (ref >60)
GLUCOSE: 102 mg/dL — AB (ref 65–99)
Potassium: 4.1 mmol/L (ref 3.5–5.1)
Sodium: 138 mmol/L (ref 135–145)
Total Protein: 5.8 g/dL — ABNORMAL LOW (ref 6.5–8.1)

## 2014-09-12 LAB — URINALYSIS, ROUTINE W REFLEX MICROSCOPIC
GLUCOSE, UA: NEGATIVE mg/dL
Hgb urine dipstick: NEGATIVE
KETONES UR: NEGATIVE mg/dL
NITRITE: NEGATIVE
PH: 5.5 (ref 5.0–8.0)
Protein, ur: NEGATIVE mg/dL
Specific Gravity, Urine: 1.016 (ref 1.005–1.030)
Urobilinogen, UA: 0.2 mg/dL (ref 0.0–1.0)

## 2014-09-12 LAB — URINE MICROSCOPIC-ADD ON

## 2014-09-12 LAB — CBC WITH DIFFERENTIAL/PLATELET
BASOS ABS: 0 10*3/uL (ref 0.0–0.1)
Basophils Relative: 1 % (ref 0–1)
Eosinophils Absolute: 0.1 10*3/uL (ref 0.0–0.7)
Eosinophils Relative: 1 % (ref 0–5)
HCT: 35.1 % — ABNORMAL LOW (ref 36.0–46.0)
Hemoglobin: 11 g/dL — ABNORMAL LOW (ref 12.0–15.0)
LYMPHS ABS: 0.7 10*3/uL (ref 0.7–4.0)
Lymphocytes Relative: 9 % — ABNORMAL LOW (ref 12–46)
MCH: 28.6 pg (ref 26.0–34.0)
MCHC: 31.3 g/dL (ref 30.0–36.0)
MCV: 91.4 fL (ref 78.0–100.0)
Monocytes Absolute: 0.5 10*3/uL (ref 0.1–1.0)
Monocytes Relative: 7 % (ref 3–12)
NEUTROS ABS: 6.6 10*3/uL (ref 1.7–7.7)
NEUTROS PCT: 82 % — AB (ref 43–77)
Platelets: 238 10*3/uL (ref 150–400)
RBC: 3.84 MIL/uL — AB (ref 3.87–5.11)
RDW: 15.2 % (ref 11.5–15.5)
WBC: 7.9 10*3/uL (ref 4.0–10.5)

## 2014-09-12 LAB — BRAIN NATRIURETIC PEPTIDE: B NATRIURETIC PEPTIDE 5: 360.2 pg/mL — AB (ref 0.0–100.0)

## 2014-09-12 LAB — LIPASE, BLOOD: Lipase: 29 U/L (ref 22–51)

## 2014-09-12 LAB — TROPONIN I: Troponin I: 0.03 ng/mL (ref <0.031)

## 2014-09-12 MED ORDER — IOHEXOL 300 MG/ML  SOLN
50.0000 mL | Freq: Once | INTRAMUSCULAR | Status: AC | PRN
  Administered 2014-09-12: 50 mL via ORAL

## 2014-09-12 MED ORDER — FUROSEMIDE 10 MG/ML IJ SOLN
40.0000 mg | Freq: Once | INTRAMUSCULAR | Status: AC
  Administered 2014-09-12: 40 mg via INTRAVENOUS
  Filled 2014-09-12: qty 4

## 2014-09-12 NOTE — ED Notes (Signed)
Pt presents with c/o abdominal distention and leg swelling. Pt reports that the swelling started a couple of weeks ago. Pt was recently seen at her doctors office and her blood work came back normal. Pt reports that she has an appointment on Monday with the gastroenterologist but she was encouraged to come to the ER if the swelling became worse. Family report the swelling has now spread to above the patients knees.

## 2014-09-12 NOTE — Discharge Instructions (Signed)
Ascites °Ascites is a gathering of fluid in the belly (abdomen). This is most often caused by liver disease. It may also be caused by a number of other less common problems. It causes a ballooning out (distension) of the abdomen. °CAUSES  °Scarring of the liver (cirrhosis) is the most common cause of ascites. Other causes include: °· Infection or inflammation in the abdomen. °· Cancer in the abdomen. °· Heart failure. °· Certain forms of kidney failure (nephritic syndrome). °· Inflammation of the pancreas. °· Clots in the veins of the liver. °SYMPTOMS  °In the early stages of ascites, you may not have any symptoms. The main symptom of ascites is a sense of abdominal bloating. This is due to the presence of fluid. This may also cause an increase in abdominal or waist size. People with this condition can develop swelling in the legs, and men can develop a swollen scrotum. When there is a lot of fluid, it may be hard to breath. Stretching of the abdomen by fluid can be painful. °DIAGNOSIS  °Certain features of your medical history, such as a history of liver disease and of an enlarging abdomen, can suggest the presence of ascites. The diagnosis of ascites can be made on physical exam by your caregiver. An abdominal ultrasound examination can confirm that ascites is present, and estimate the amount of fluid. °Once ascites is confirmed, it is important to determine its cause. Again, a history of one of the conditions listed in "CAUSES" provides a strong clue. A physical exam is important, and blood and X-ray tests may be needed. During a procedure called paracentesis, a sample of fluid is removed from the abdomen. This can determine certain key features about the fluid, such as whether or not infection or cancer is present. Your caregiver will determine if a paracentesis is necessary. They will describe the procedure to you. °PREVENTION  °Ascites is a complication of other conditions. Therefore to prevent ascites, you  must seek treatment for any significant health conditions you have. Once ascites is present, careful attention to fluid and salt intake may help prevent it from getting worse. If you have ascites, you should not drink alcohol. °PROGNOSIS  °The prognosis of ascites depends on the underlying disease. If the disease is reversible, such as with certain infections or with heart failure, then ascites may improve or disappear. When ascites is caused by cirrhosis, then it indicates that the liver disease has worsened, and further evaluation and treatment of the liver disease is needed. If your ascites is caused by cancer, then the success or failure of the cancer treatment will determine whether your ascites will improve or worsen. °RISKS AND COMPLICATIONS  °Ascites is likely to worsen if it is not properly diagnosed and treated. A large amount of ascites can cause pain and difficulty breathing. The main complication, besides worsening, is infection (called spontaneous bacterial peritonitis). This requires prompt treatment. °TREATMENT  °The treatment of ascites depends on its cause. When liver disease is your cause, medical management using water pills (diuretics) and decreasing salt intake is often effective. Ascites due to peritoneal inflammation or malignancy (cancer) alone does not respond to salt restriction and diuretics. Hospitalization is sometimes required. °If the treatment of ascites cannot be managed with medications, a number of other treatments are available. Your caregivers will help you decide which will work best for you. Some of these are: °· Removal of fluid from the abdomen (paracentesis). °· Fluid from the abdomen is passed into a vein (peritoneovenous shunting). °·   Liver transplantation. °· Transjugular intrahepatic portosystemic stent shunt. °HOME CARE INSTRUCTIONS  °It is important to monitor body weight and the intake and output of fluids. Weigh yourself at the same time every day. Record your  weights. Fluid restriction may be necessary. It is also important to know your salt intake. The more salt you take in, the more fluid you will retain. Ninety percent of people with ascites respond to this approach. °· Follow any directions for medicines carefully. °· Follow up with your caregiver, as directed. °· Report any changes in your health, especially any new or worsening symptoms. °· If your ascites is from liver disease, avoid alcohol and other substances toxic to the liver. °SEEK MEDICAL CARE IF:  °· Your weight increases more than a few pounds in a few days. °· Your abdominal or waist size increases. °· You develop swelling in your legs. °· You had swelling and it worsens. °SEEK IMMEDIATE MEDICAL CARE IF:  °· You develop a fever. °· You develop new abdominal pain. °· You develop difficulty breathing. °· You develop confusion. °· You have bleeding from the mouth, stomach, or rectum. °MAKE SURE YOU:  °· Understand these instructions. °· Will watch your condition. °· Will get help right away if you are not doing well or get worse. °Document Released: 02/21/2005 Document Revised: 05/16/2011 Document Reviewed: 09/22/2006 °ExitCare® Patient Information ©2015 [employer]. This information is not intended to replace advice given to you by your health care provider. Make sure you discuss any questions you have with your health care provider. ° °

## 2014-09-12 NOTE — ED Provider Notes (Addendum)
CSN: [id number]     Arrival date & time 09/12/14  1339 History   First MD Initiated Contact with Patient 09/12/14 1455     Chief Complaint  Patient presents with  . Abdominal distention   . Leg Swelling     (Consider location/radiation/quality/duration/timing/severity/associated sxs/prior Treatment) HPI Comments: Patient presents to the emergency department for multiple problems. Patient has reportedly had progressively worsening swelling of legs distention of her abdomen over a period of 2 weeks. Patient has been seen by her doctor twice for this. She had her Lasix increased at the second dose but has not had any increased urine output. She and her family feel like her legs are getting more swollen in the abdomen is more distended. She has nausea and vomiting after she eats and drinks. There is no abdominal pain. Patient has not had any chest pain or shortness of breath. Patient has follow-up scheduled with gastroenterology next week.   Past Medical History  Diagnosis Date  . Hypertension   . Hyperlipidemia   . COPD (chronic obstructive pulmonary disease)   . Osteoporosis   . H/O hiatal hernia   . Chronic back pain   . CAD (coronary artery disease)     LHC 9/05:  pLAD less than 20%, D1 20-30%, ostial RCA 40-50%, proximal RCA 20%, EF 60%.  . C. difficile diarrhea   . Gout   . Atrial fibrillation     a. failed DCCV => b. amiodarone added => converted to NSR on Amiodarone;  c. Xarelto d/c'd 2/2 falling (wrist and hip Fx in 1/14)  . Carotid stenosis     s/p bilat CEA  . C. difficile colitis     10/2011;  11/2011  . Salmonella enteritis 08/2011    c/b septic shock  . Hx of echocardiogram     a. Echocardiogram 09/29/11: Mild LVH, EF 45-50%, diffuse HK, mild to moderate aortic stenosis, mean gradient 12 mmHg, moderate MAC, mild MR, moderate LAE, moderate RAE, question small secundum ASD with left to right flow not seen in 4 chamber view, PASP 35-39 (mild pulmonary hypertension). ;  b.  TEE on  10/04/11: Mild LVH, EF 55%, mild MR, no defect or PFO  . Pneumonia 11/2101  . PONV (postoperative nausea and vomiting)   . CHF (congestive heart failure)   . Chronic kidney disease   . GERD (gastroesophageal reflux disease)   . Hepatitis   . Anemia   . Hx of echocardiogram     Echo (09/2013): EF 55%, normal wall motion, mild aortic stenosis (mean 12 mm Hg), MAC, mild MR, mild LAE, mild PI, PASP 30 mm Hg  . Hx of cardiovascular stress test     Lexiscan Myoview (7/15):  No ischemia or infarct, EF 50%, Low Risk  . Arthritis     Neck  . PAF (paroxysmal atrial fibrillation)    Past Surgical History  Procedure Laterality Date  . Carotid endarterectomy      bilateral  . Cataract extraction, bilateral    . Joint replacement  2008    Right Total Knee  . Anal fistula repair    . Left parotidectomy    . Eye surgery    . Flexible sigmoidoscopy  09/03/2011    Procedure: FLEXIBLE SIGMOIDOSCOPY;  Surgeon: Beryle Beams, MD;  Location: [facility];  Service: Endoscopy;  Laterality: N/A;  . Tee without cardioversion  10/04/2011    Procedure: TRANSESOPHAGEAL ECHOCARDIOGRAM (TEE);  Surgeon: Larey Dresser, MD;  Location: [place];  Service:  Cardiovascular;  Laterality: N/A;  to be carelinked here by 1230-verified 7/29/dl  . Cardioversion  10/04/2011    Procedure: CARDIOVERSION;  Surgeon: Larey Dresser, MD;  Location: [facility] END[facility]COPY;  Service: Cardiovascular;  Laterality: N/A;  . Esophagogastroduodenoscopy  11/29/2011    Procedure: ESOPHAGOGASTRODUODENOSCOPY (EGD);  Surgeon: Cleotis Nipper, MD;  Location: [facility];  Service: Endoscopy;  Laterality: N/A;  recent h/o resp failure/pneumonia  . Left hip fracture repair  03/14/12  . Open reduction internal fixation (orif) scaphoid with distal radius graft  03/14/2012    Procedure: OPEN REDUCTION INTERNAL FIXATION (ORIF) SCAPHOID WITH DISTAL RADIUS GRAFT;  Surgeon: Jolyn Nap, MD;  Location: [facility];  Service: Orthopedics;  Laterality: Left;  DVR  wrist fracture set/ hand innovation  . Hip pinning,cannulated  03/14/2012    Procedure: CANNULATED HIP PINNING;  Surgeon: Jolyn Nap, MD;  Location: [facility];  Service: Orthopedics;  Laterality: Left;  Biomet 6.5 cannulated screws  . Femur im nail Left 04/13/2012    Procedure: INTRAMEDULLARY (IM) NAIL FEMORAL;  Surgeon: Jolyn Nap, MD;  Location: [place];  Service: Orthopedics;  Laterality: Left;  OPEN REDUCTION INTERNAL FIXATION LEFT PROXIMAL FEMUR Fracture    Family History  Problem Relation Age of Onset  . Hemochromatosis Sister   . Diabetes Sister   . Hypertension Sister   . Diabetes type II Mother   . Diabetes Mother   . Hypertension Mother   . Deep vein thrombosis Mother   . Hypertension Father   . Hypertension Brother   . Other Brother     Leg amputation-Gun shot  . Depression Brother   . Hemochromatosis Sister   . Heart attack Neg Hx   . Stroke Neg Hx   . Cancer Sister    History  Substance Use Topics  . Smoking status: Former Smoker -- 1.00 packs/day for 20 years    Types: Cigarettes    Quit date: 09/01/1983  . Smokeless tobacco: Never Used  . Alcohol Use: No   OB History    No data available     Review of Systems  Respiratory: Negative for shortness of breath.   Cardiovascular: Positive for leg swelling. Negative for chest pain.  Gastrointestinal: Positive for nausea, vomiting and abdominal distention. Negative for abdominal pain and blood in stool.  All other systems reviewed and are negative.     Allergies  Baby powder; Demerol; Fexofenadine; Penicillins; and Spiriva handihaler  Home Medications   Prior to Admission medications   Medication Sig Start Date End Date Taking? Authorizing Provider  allopurinol (ZYLOPRIM) 100 MG tablet Take 100 mg by mouth every morning.    Yes Historical Provider, MD  amiodarone (PACERONE) 200 MG tablet Take 1 tablet (200 mg total) by mouth daily. 04/07/14  Yes Erlene Quan, PA-C  apixaban (ELIQUIS) 2.5 MG TABS tablet  Take 1 tablet (2.5 mg total) by mouth 2 (two) times daily. 04/14/14  Yes Burnell Blanks, MD  atorvastatin (LIPITOR) 40 MG tablet Take 40 mg by mouth at bedtime.   Yes Historical Provider, MD  budesonide-formoterol (SYMBICORT) 160-4.5 MCG/ACT inhaler Inhale 2 puffs into the lungs 2 (two) times daily.   Yes Historical Provider, MD  cholecalciferol (VITAMIN D) 1000 UNITS tablet Take 1,000-2,000 Units by mouth 2 (two) times daily. Take 2000 units in the morning and 1000 units in the evening   Yes Historical Provider, MD  diltiazem (TIAZAC) 240 MG 24 hr capsule Take 240 mg by mouth daily.   Yes Historical  Provider, MD  Docusate Calcium (STOOL SOFTENER PO) Take 1 tablet by mouth as needed (constipation).   Yes Historical Provider, MD  ferrous sulfate 325 (65 FE) MG tablet Take 325 mg by mouth daily.    Yes Historical Provider, MD  fish oil-omega-3 fatty acids 1000 MG capsule Take 1 g by mouth 2 (two) times daily.    Yes Historical Provider, MD  Flaxseed, Linseed, (FLAX SEED OIL PO) Take 1 capsule by mouth 2 (two) times daily.    Yes Historical Provider, MD  furosemide (LASIX) 20 MG tablet Take 1 tablet (20 mg total) by mouth daily. Patient taking differently: Take 20-40 mg by mouth daily as needed for fluid or edema.  07/25/14  Yes Burnell Blanks, MD  levalbuterol [facility] HFA) 45 MCG/ACT inhaler Inhale 2 puffs into the lungs every 6 (six) hours as needed for wheezing.    Yes Historical Provider, MD  levalbuterol Penne Lash) 0.63 MG/3ML nebulizer solution Take 0.63 mg by nebulization every 4 (four) hours as needed for wheezing or shortness of breath.   Yes Historical Provider, MD  montelukast (SINGULAIR) 10 MG tablet Take 10 mg by mouth at bedtime.   Yes Historical Provider, MD  omeprazole (PRILOSEC) 40 MG capsule Take 40 mg by mouth every evening.   Yes Historical Provider, MD  polyethylene glycol (MIRALAX / GLYCOLAX) packet Take 17 g by mouth daily as needed for mild constipation.   Yes  Historical Provider, MD  raloxifene (EVISTA) 60 MG tablet Take 60 mg by mouth every morning.    Yes Historical Provider, MD  simethicone (MYLICON) 80 MG chewable tablet Chew 80 mg by mouth as needed for flatulence.   Yes Historical Provider, MD  meclizine (ANTIVERT) 25 MG tablet Take 1 tablet (25 mg total) by mouth 2 (two) times daily as needed for dizziness. Patient not taking: Reported on 09/12/2014 07/06/13   Merryl Hacker, MD   BP 120/67 mmHg  Pulse 58  Temp(Src) 98.2 F (36.8 C) (Oral)  Resp 18  SpO2 94% Physical Exam  Constitutional: She is oriented to person, place, and time. She appears well-developed and well-nourished. No distress.  HENT:  Head: Normocephalic and atraumatic.  Right Ear: Hearing normal.  Left Ear: Hearing normal.  Nose: Nose normal.  Mouth/Throat: Oropharynx is clear and moist and mucous membranes are normal.  Eyes: Conjunctivae and EOM are normal. Pupils are equal, round, and reactive to light.  Neck: Normal range of motion. Neck supple.  Cardiovascular: Regular rhythm, S1 normal and S2 normal.  Exam reveals no gallop and no friction rub.   No murmur heard. Pulmonary/Chest: Effort normal and breath sounds normal. No respiratory distress. She exhibits no tenderness.  Abdominal: Soft. Normal appearance and bowel sounds are normal. She exhibits distension. There is no hepatosplenomegaly. There is no tenderness. There is no rebound, no guarding, no tenderness at McBurney's point and negative Murphy's sign. No hernia.  Musculoskeletal: Normal range of motion. She exhibits edema.  Neurological: She is alert and oriented to person, place, and time. She has normal strength. No cranial nerve deficit or sensory deficit. Coordination normal. GCS eye subscore is 4. GCS verbal subscore is 5. GCS motor subscore is 6.  Skin: Skin is warm, dry and intact. No rash noted. No cyanosis.  Psychiatric: She has a normal mood and affect. Her speech is normal and behavior is normal.  Thought content normal.  Nursing note and vitals reviewed.   ED Course  Procedures (including critical care time) Labs Review Labs Reviewed  COMPREHENSIVE METABOLIC PANEL - Abnormal; Notable for the following:    Glucose, Bld 102 (*)    BUN 24 (*)    Creatinine, Ser 1.67 (*)    Calcium 8.4 (*)    Total Protein 5.8 (*)    Albumin 2.5 (*)    GFR calc non Af Amer 26 (*)    GFR calc Af Amer 30 (*)    All other components within normal limits  CBC WITH DIFFERENTIAL/PLATELET - Abnormal; Notable for the following:    RBC 3.84 (*)    Hemoglobin 11.0 (*)    HCT 35.1 (*)    Neutrophils Relative % 82 (*)    Lymphocytes Relative 9 (*)    All other components within normal limits  URINALYSIS, ROUTINE W REFLEX MICROSCOPIC (NOT AT [facility]) - Abnormal; Notable for the following:    APPearance CLOUDY (*)    Bilirubin Urine SMALL (*)    Leukocytes, UA SMALL (*)    All other components within normal limits  BRAIN NATRIURETIC PEPTIDE - Abnormal; Notable for the following:    B Natriuretic Peptide 360.2 (*)    All other components within normal limits  URINE MICROSCOPIC-ADD ON - Abnormal; Notable for the following:    Squamous Epithelial / LPF MANY (*)    Bacteria, UA FEW (*)    Casts HYALINE CASTS (*)    All other components within normal limits  LIPASE, BLOOD  TROPONIN I    Imaging Review Ct Abdomen Pelvis Wo Contrast  09/12/2014   CLINICAL DATA:  81 year old female with abdominal pain and distention.  EXAM: CT ABDOMEN AND PELVIS WITHOUT CONTRAST  TECHNIQUE: Multidetector CT imaging of the abdomen and pelvis was performed following the standard protocol without IV contrast.  COMPARISON:  11/03/2011 CT  FINDINGS: Please note that parenchymal abnormalities may be missed without intravenous contrast.  Lower chest: Cardiomegaly, tiny bilateral pleural effusions and bibasilar atelectasis identified.  Hepatobiliary: The liver and gallbladder are unremarkable. There is no evidence of biliary  dilatation.  Pancreas: Unremarkable  Spleen: Unremarkable  Adrenals/Urinary Tract: Bilateral renal atrophy identified. The adrenal glands and bladder are unremarkable.  Stomach/Bowel: There is possible circumferential wall thickening of the distal stomach. Colonic diverticulosis is identified without evidence of diverticulitis. No other focal bowel wall thickening is identified.  Vascular/Lymphatic: Abdominal aortic atherosclerotic calcifications are noted. There is no evidence of abdominal aortic aneurysm. No enlarged lymph nodes are noted.  Reproductive: The uterus and adnexal regions are unremarkable.  Other: A large amount of ascites is noted. Stranding in the omentum is present. No gross peritoneal nodules or masses are identified. There is no evidence of pneumoperitoneum.  Musculoskeletal: Surgical hardware within the proximal left femur is noted. Moderate -severe degenerative changes in the lumbar spine are identified. Grade 1 anterolisthesis of L4 on L5 is unchanged.  IMPRESSION: Large amount of ascites without identifiable cause. Omental stranding may represent edema but tumor involvement of the omentum is not entirely excluded. No adnexal masses identified.  Possible distal stomach wall thickening. Consider direct inspection as clinically indicated.  Bilateral renal atrophy.  Cardiomegaly, tiny bilateral pleural effusions and mild bibasilar atelectasis.   Electronically Signed   By: Margarette Canada M.D.   On: 09/12/2014 18:34     EKG Interpretation   Date/Time:  Friday September 12 2014 15:21:46 EDT Ventricular Rate:  102 PR Interval:    QRS Duration: 90 QT Interval:  363 QTC Calculation: 473 R Axis:   35 Text Interpretation:  Atrial fibrillation Ventricular bigeminy Borderline  repolarization abnormality No significant change since last tracing  Confirmed by [facility]  MD, Shannon Kirkendall [phone]) on 09/12/2014 3:28:43 PM      MDM   Final diagnoses:  Distended abdomen  Ascites    Patient presents  to the emergency room for evaluation of abdominal distention. Patient reports the symptoms have been ongoing over a period of several weeks. She reports slow progression. She has had her Lasix increased by her primary doctor without improvement. She has been experiencing leg swelling as well, but has not had any shortness of breath. Clinically does not appear to be in congestive heart failure. She does have a history of peripheral edema. She also takes eloquence, no concern for DVT at this time.  Workup reveals large ascites without identified cause. Concern for intra-abdominal tumor, but none is seen on the scan. Patient fortuitously has follow-up with GI scheduled for Monday. Primary care doctor and GI consult to further workup, including paracentesis. She does not require hospitalization at this time. Findings were discussed with patient and her daughter. They understand the findings and are in agreement with outpatient follow-up.  Urinalysis is equivocal. There appears to be contamination, will obtain urine culture.  Orpah Greek, MD 09/12/14 [place], MD 09/12/14 Darlin Drop

## 2014-09-12 NOTE — ED Notes (Signed)
Patient transported to CT 

## 2014-09-15 ENCOUNTER — Encounter: Payer: Self-pay | Admitting: Cardiovascular Disease

## 2014-09-15 ENCOUNTER — Telehealth: Payer: Self-pay | Admitting: Cardiovascular Disease

## 2014-09-15 LAB — URINE CULTURE

## 2014-09-15 NOTE — Telephone Encounter (Signed)
Spoke with [name] [name] and procedure is paracentesis.

## 2014-09-15 NOTE — Telephone Encounter (Signed)
I returned call to [place] at [facility] to clarify what procedure is being scheduled for pt. She is not available and will return my call.  I confirmed fax number below is correct.

## 2014-09-15 NOTE — Telephone Encounter (Signed)
Letter faxed to [facility]

## 2014-09-15 NOTE — Telephone Encounter (Signed)
New message    Request for surgical clearance:  1. What type of surgery is being performed? thoracentesis   2. When is this surgery scheduled? Pending    3. Are there any medications that need to be held prior to surgery and how long? Hold eliquis   .    4. Name of physician performing surgery? Dr. Gardiner Coins   5. What is your office phone and fax number? [phone] / fax [phone]

## 2014-09-15 NOTE — Telephone Encounter (Signed)
Letter in chart. cdm

## 2014-09-16 ENCOUNTER — Other Ambulatory Visit (HOSPITAL_COMMUNITY): Payer: Self-pay | Admitting: Physician Assistant

## 2014-09-16 ENCOUNTER — Telehealth (HOSPITAL_COMMUNITY): Payer: Self-pay

## 2014-09-16 DIAGNOSIS — R188 Other ascites: Secondary | ICD-10-CM

## 2014-09-16 NOTE — Telephone Encounter (Signed)
Post ED Visit - Positive Culture Follow-up  Culture report reviewed by antimicrobial stewardship pharmacist: []  Wes [place], Pharm.D., BCPS [x]  Heide Guile, Pharm.D., BCPS []  Alycia Rossetti, Pharm.D., BCPS []  [place], Pharm.D., BCPS, AAHIVP []  Legrand Como, Pharm.D., BCPS, AAHIVP []  Isac Sarna, Pharm.D., BCPS  Positive urine culture  no further patient follow-up is required at this time per [facility].  Ileene Musa 09/16/2014, 12:45 PM

## 2014-09-18 ENCOUNTER — Telehealth: Payer: Self-pay | Admitting: Cardiovascular Disease

## 2014-09-18 ENCOUNTER — Ambulatory Visit (HOSPITAL_COMMUNITY)
Admission: RE | Admit: 2014-09-18 | Discharge: 2014-09-18 | Disposition: A | Discharge status: Home / Self Care | Payer: Medicare Other | Source: Ambulatory Visit | Attending: Physician Assistant | Admitting: Physician Assistant

## 2014-09-18 DIAGNOSIS — R188 Other ascites: Secondary | ICD-10-CM | POA: Insufficient documentation

## 2014-09-18 LAB — BODY FLUID CELL COUNT WITH DIFFERENTIAL
EOS FL: 0 %
Lymphs, Fluid: 34 %
Monocyte-Macrophage-Serous Fluid: 33 % — ABNORMAL LOW (ref 50–90)
Neutrophil Count, Fluid: 33 % — ABNORMAL HIGH (ref 0–25)
OTHER CELLS FL: 0 %
WBC FLUID: 1256 uL — AB (ref 0–1000)

## 2014-09-18 MED ORDER — ALBUMIN HUMAN 25 % IV SOLN
50.0000 g | Freq: Once | INTRAVENOUS | Status: AC
  Administered 2014-09-18 (×3): 50 g via INTRAVENOUS
  Filled 2014-09-18: qty 200

## 2014-09-18 MED ORDER — LIDOCAINE HCL (PF) 1 % IJ SOLN
INTRAMUSCULAR | Status: AC
  Filled 2014-09-18: qty 10

## 2014-09-18 NOTE — Progress Notes (Signed)
Albumin infusions complete, paracentesis complete; 4L removed. To be d/c with daughter at 1230 if VS stable.

## 2014-09-18 NOTE — Procedures (Signed)
   US guided RLQ para  3.8 L yellow fluid Sent for labs  50 gr IV Albumin during procedure per MD

## 2014-09-18 NOTE — Telephone Encounter (Signed)
New Message       Pt's daughter calling stating that pt was taken off Eliquis for a procedure, pt had procedure done today and they are wanting to know if pt should take her Eliquis tonight. Please call back and advise.

## 2014-09-18 NOTE — Telephone Encounter (Signed)
I spoke with pt's daughter and told her to contact physician that did procedure for instructions about resuming Eliquis

## 2014-09-19 LAB — GRAM STAIN

## 2014-09-22 ENCOUNTER — Inpatient Hospital Stay (HOSPITAL_COMMUNITY)
Admission: EM | Admit: 2014-09-22 | Discharge: 2014-10-02 | DRG: 374 | Disposition: A | Discharge status: Skilled Nursing Facility | Payer: Medicare Other | Attending: Internal Medicine | Admitting: Internal Medicine

## 2014-09-22 ENCOUNTER — Encounter (HOSPITAL_COMMUNITY): Payer: Self-pay | Admitting: Emergency Medicine

## 2014-09-22 DIAGNOSIS — Z885 Allergy status to narcotic agent status: Secondary | ICD-10-CM

## 2014-09-22 DIAGNOSIS — Z515 Encounter for palliative care: Secondary | ICD-10-CM | POA: Diagnosis not present

## 2014-09-22 DIAGNOSIS — Z8249 Family history of ischemic heart disease and other diseases of the circulatory system: Secondary | ICD-10-CM | POA: Diagnosis not present

## 2014-09-22 DIAGNOSIS — N289 Disorder of kidney and ureter, unspecified: Secondary | ICD-10-CM | POA: Diagnosis not present

## 2014-09-22 DIAGNOSIS — R188 Other ascites: Secondary | ICD-10-CM

## 2014-09-22 DIAGNOSIS — M549 Dorsalgia, unspecified: Secondary | ICD-10-CM | POA: Diagnosis present

## 2014-09-22 DIAGNOSIS — I272 Other secondary pulmonary hypertension: Secondary | ICD-10-CM | POA: Diagnosis present

## 2014-09-22 DIAGNOSIS — Z888 Allergy status to other drugs, medicaments and biological substances status: Secondary | ICD-10-CM | POA: Diagnosis not present

## 2014-09-22 DIAGNOSIS — M810 Age-related osteoporosis without current pathological fracture: Secondary | ICD-10-CM | POA: Diagnosis present

## 2014-09-22 DIAGNOSIS — Z79899 Other long term (current) drug therapy: Secondary | ICD-10-CM

## 2014-09-22 DIAGNOSIS — I5043 Acute on chronic combined systolic (congestive) and diastolic (congestive) heart failure: Secondary | ICD-10-CM | POA: Diagnosis present

## 2014-09-22 DIAGNOSIS — I2510 Atherosclerotic heart disease of native coronary artery without angina pectoris: Secondary | ICD-10-CM | POA: Diagnosis present

## 2014-09-22 DIAGNOSIS — R1110 Vomiting, unspecified: Secondary | ICD-10-CM

## 2014-09-22 DIAGNOSIS — Z8711 Personal history of peptic ulcer disease: Secondary | ICD-10-CM

## 2014-09-22 DIAGNOSIS — I481 Persistent atrial fibrillation: Secondary | ICD-10-CM | POA: Diagnosis present

## 2014-09-22 DIAGNOSIS — G8929 Other chronic pain: Secondary | ICD-10-CM | POA: Diagnosis present

## 2014-09-22 DIAGNOSIS — Z833 Family history of diabetes mellitus: Secondary | ICD-10-CM

## 2014-09-22 DIAGNOSIS — D649 Anemia, unspecified: Secondary | ICD-10-CM | POA: Diagnosis present

## 2014-09-22 DIAGNOSIS — Z7901 Long term (current) use of anticoagulants: Secondary | ICD-10-CM | POA: Diagnosis not present

## 2014-09-22 DIAGNOSIS — I4891 Unspecified atrial fibrillation: Secondary | ICD-10-CM | POA: Diagnosis not present

## 2014-09-22 DIAGNOSIS — K652 Spontaneous bacterial peritonitis: Secondary | ICD-10-CM | POA: Diagnosis present

## 2014-09-22 DIAGNOSIS — I480 Paroxysmal atrial fibrillation: Secondary | ICD-10-CM | POA: Diagnosis present

## 2014-09-22 DIAGNOSIS — Z66 Do not resuscitate: Secondary | ICD-10-CM | POA: Diagnosis present

## 2014-09-22 DIAGNOSIS — R0989 Other specified symptoms and signs involving the circulatory and respiratory systems: Secondary | ICD-10-CM | POA: Diagnosis present

## 2014-09-22 DIAGNOSIS — E785 Hyperlipidemia, unspecified: Secondary | ICD-10-CM

## 2014-09-22 DIAGNOSIS — I509 Heart failure, unspecified: Secondary | ICD-10-CM | POA: Diagnosis not present

## 2014-09-22 DIAGNOSIS — R140 Abdominal distension (gaseous): Secondary | ICD-10-CM | POA: Diagnosis present

## 2014-09-22 DIAGNOSIS — Z87891 Personal history of nicotine dependence: Secondary | ICD-10-CM | POA: Diagnosis not present

## 2014-09-22 DIAGNOSIS — R609 Edema, unspecified: Secondary | ICD-10-CM

## 2014-09-22 DIAGNOSIS — J189 Pneumonia, unspecified organism: Secondary | ICD-10-CM | POA: Diagnosis present

## 2014-09-22 DIAGNOSIS — R6881 Early satiety: Secondary | ICD-10-CM | POA: Diagnosis present

## 2014-09-22 DIAGNOSIS — Z809 Family history of malignant neoplasm, unspecified: Secondary | ICD-10-CM | POA: Diagnosis not present

## 2014-09-22 DIAGNOSIS — E875 Hyperkalemia: Secondary | ICD-10-CM | POA: Diagnosis not present

## 2014-09-22 DIAGNOSIS — M1990 Unspecified osteoarthritis, unspecified site: Secondary | ICD-10-CM | POA: Diagnosis present

## 2014-09-22 DIAGNOSIS — I129 Hypertensive chronic kidney disease with stage 1 through stage 4 chronic kidney disease, or unspecified chronic kidney disease: Secondary | ICD-10-CM | POA: Diagnosis present

## 2014-09-22 DIAGNOSIS — K210 Gastro-esophageal reflux disease with esophagitis: Secondary | ICD-10-CM | POA: Diagnosis present

## 2014-09-22 DIAGNOSIS — J449 Chronic obstructive pulmonary disease, unspecified: Secondary | ICD-10-CM | POA: Diagnosis present

## 2014-09-22 DIAGNOSIS — C786 Secondary malignant neoplasm of retroperitoneum and peritoneum: Secondary | ICD-10-CM | POA: Diagnosis present

## 2014-09-22 DIAGNOSIS — C569 Malignant neoplasm of unspecified ovary: Secondary | ICD-10-CM | POA: Diagnosis present

## 2014-09-22 DIAGNOSIS — I482 Chronic atrial fibrillation: Secondary | ICD-10-CM | POA: Diagnosis present

## 2014-09-22 DIAGNOSIS — Y95 Nosocomial condition: Secondary | ICD-10-CM | POA: Diagnosis present

## 2014-09-22 DIAGNOSIS — B49 Unspecified mycosis: Secondary | ICD-10-CM | POA: Diagnosis present

## 2014-09-22 DIAGNOSIS — N183 Chronic kidney disease, stage 3 unspecified: Secondary | ICD-10-CM | POA: Diagnosis present

## 2014-09-22 DIAGNOSIS — R180 Malignant ascites: Secondary | ICD-10-CM | POA: Diagnosis present

## 2014-09-22 DIAGNOSIS — R634 Abnormal weight loss: Secondary | ICD-10-CM | POA: Diagnosis present

## 2014-09-22 DIAGNOSIS — I10 Essential (primary) hypertension: Secondary | ICD-10-CM | POA: Diagnosis present

## 2014-09-22 DIAGNOSIS — M109 Gout, unspecified: Secondary | ICD-10-CM | POA: Diagnosis present

## 2014-09-22 DIAGNOSIS — C169 Malignant neoplasm of stomach, unspecified: Secondary | ICD-10-CM | POA: Diagnosis present

## 2014-09-22 DIAGNOSIS — N179 Acute kidney failure, unspecified: Secondary | ICD-10-CM | POA: Diagnosis present

## 2014-09-22 DIAGNOSIS — I5042 Chronic combined systolic (congestive) and diastolic (congestive) heart failure: Secondary | ICD-10-CM | POA: Diagnosis present

## 2014-09-22 DIAGNOSIS — I350 Nonrheumatic aortic (valve) stenosis: Secondary | ICD-10-CM | POA: Diagnosis present

## 2014-09-22 DIAGNOSIS — C799 Secondary malignant neoplasm of unspecified site: Secondary | ICD-10-CM | POA: Diagnosis present

## 2014-09-22 DIAGNOSIS — R109 Unspecified abdominal pain: Secondary | ICD-10-CM

## 2014-09-22 DIAGNOSIS — R0602 Shortness of breath: Secondary | ICD-10-CM | POA: Diagnosis not present

## 2014-09-22 DIAGNOSIS — N184 Chronic kidney disease, stage 4 (severe): Secondary | ICD-10-CM | POA: Diagnosis present

## 2014-09-22 DIAGNOSIS — R339 Retention of urine, unspecified: Secondary | ICD-10-CM | POA: Diagnosis present

## 2014-09-22 DIAGNOSIS — M069 Rheumatoid arthritis, unspecified: Secondary | ICD-10-CM | POA: Diagnosis present

## 2014-09-22 DIAGNOSIS — R110 Nausea: Secondary | ICD-10-CM | POA: Diagnosis not present

## 2014-09-22 DIAGNOSIS — Z880 Allergy status to penicillin: Secondary | ICD-10-CM | POA: Diagnosis not present

## 2014-09-22 LAB — COMPREHENSIVE METABOLIC PANEL
ALT: 17 U/L (ref 14–54)
AST: 24 U/L (ref 15–41)
Albumin: 2.5 g/dL — ABNORMAL LOW (ref 3.5–5.0)
Alkaline Phosphatase: 64 U/L (ref 38–126)
Anion gap: 9 (ref 5–15)
BUN: 30 mg/dL — ABNORMAL HIGH (ref 6–20)
CO2: 27 mmol/L (ref 22–32)
Calcium: 8.4 mg/dL — ABNORMAL LOW (ref 8.9–10.3)
Chloride: 100 mmol/L — ABNORMAL LOW (ref 101–111)
Creatinine, Ser: 2.04 mg/dL — ABNORMAL HIGH (ref 0.44–1.00)
GFR calc Af Amer: 24 mL/min — ABNORMAL LOW (ref >60)
GFR calc non Af Amer: 21 mL/min — ABNORMAL LOW (ref >60)
Glucose, Bld: 95 mg/dL (ref 65–99)
Potassium: 3.8 mmol/L (ref 3.5–5.1)
Sodium: 136 mmol/L (ref 135–145)
Total Bilirubin: 0.5 mg/dL (ref 0.3–1.2)
Total Protein: 5.9 g/dL — ABNORMAL LOW (ref 6.5–8.1)

## 2014-09-22 LAB — URINALYSIS, ROUTINE W REFLEX MICROSCOPIC
Glucose, UA: NEGATIVE mg/dL
Hgb urine dipstick: NEGATIVE
Ketones, ur: NEGATIVE mg/dL
Nitrite: NEGATIVE
Protein, ur: NEGATIVE mg/dL
Specific Gravity, Urine: 1.023 (ref 1.005–1.030)
Urobilinogen, UA: 0.2 mg/dL (ref 0.0–1.0)
pH: 5 (ref 5.0–8.0)

## 2014-09-22 LAB — CBC WITH DIFFERENTIAL/PLATELET
Basophils Absolute: 0 10*3/uL (ref 0.0–0.1)
Basophils Relative: 0 % (ref 0–1)
Eosinophils Absolute: 0 10*3/uL (ref 0.0–0.7)
Eosinophils Relative: 0 % (ref 0–5)
HCT: 34.7 % — ABNORMAL LOW (ref 36.0–46.0)
Hemoglobin: 11.2 g/dL — ABNORMAL LOW (ref 12.0–15.0)
Lymphocytes Relative: 8 % — ABNORMAL LOW (ref 12–46)
Lymphs Abs: 0.8 10*3/uL (ref 0.7–4.0)
MCH: 28.8 pg (ref 26.0–34.0)
MCHC: 32.3 g/dL (ref 30.0–36.0)
MCV: 89.2 fL (ref 78.0–100.0)
Monocytes Absolute: 0.6 10*3/uL (ref 0.1–1.0)
Monocytes Relative: 6 % (ref 3–12)
Neutro Abs: 8.6 10*3/uL — ABNORMAL HIGH (ref 1.7–7.7)
Neutrophils Relative %: 86 % — ABNORMAL HIGH (ref 43–77)
Platelets: 274 10*3/uL (ref 150–400)
RBC: 3.89 MIL/uL (ref 3.87–5.11)
RDW: 15.1 % (ref 11.5–15.5)
WBC: 9.9 10*3/uL (ref 4.0–10.5)

## 2014-09-22 LAB — CBC
HEMATOCRIT: 33.8 % — AB (ref 36.0–46.0)
HEMOGLOBIN: 10.8 g/dL — AB (ref 12.0–15.0)
MCH: 28.6 pg (ref 26.0–34.0)
MCHC: 32 g/dL (ref 30.0–36.0)
MCV: 89.7 fL (ref 78.0–100.0)
Platelets: 266 10*3/uL (ref 150–400)
RBC: 3.77 MIL/uL — AB (ref 3.87–5.11)
RDW: 15.1 % (ref 11.5–15.5)
WBC: 8.7 10*3/uL (ref 4.0–10.5)

## 2014-09-22 LAB — CREATININE, SERUM
Creatinine, Ser: 1.97 mg/dL — ABNORMAL HIGH (ref 0.44–1.00)
GFR calc Af Amer: 25 mL/min — ABNORMAL LOW (ref >60)
GFR, EST NON AFRICAN AMERICAN: 22 mL/min — AB (ref >60)

## 2014-09-22 LAB — SEDIMENTATION RATE: Sed Rate: 55 mm/hr — ABNORMAL HIGH (ref 0–22)

## 2014-09-22 LAB — URINE MICROSCOPIC-ADD ON

## 2014-09-22 LAB — BRAIN NATRIURETIC PEPTIDE: B Natriuretic Peptide: 165.9 pg/mL — ABNORMAL HIGH (ref 0.0–100.0)

## 2014-09-22 LAB — TSH: TSH: 2.218 u[IU]/mL (ref 0.350–4.500)

## 2014-09-22 LAB — LIPASE, BLOOD: Lipase: 29 U/L (ref 22–51)

## 2014-09-22 MED ORDER — SACCHAROMYCES BOULARDII 250 MG PO CAPS
250.0000 mg | ORAL_CAPSULE | Freq: Two times a day (BID) | ORAL | Status: DC
  Administered 2014-09-22 – 2014-10-02 (×18): 250 mg via ORAL
  Filled 2014-09-22 (×21): qty 1

## 2014-09-22 MED ORDER — ALLOPURINOL 100 MG PO TABS
100.0000 mg | ORAL_TABLET | Freq: Every morning | ORAL | Status: DC
Start: 2014-09-23 — End: 2014-10-02
  Administered 2014-09-23 – 2014-10-02 (×9): 100 mg via ORAL
  Filled 2014-09-22 (×10): qty 1

## 2014-09-22 MED ORDER — ONDANSETRON HCL 4 MG/2ML IJ SOLN
4.0000 mg | Freq: Four times a day (QID) | INTRAMUSCULAR | Status: DC | PRN
  Administered 2014-09-23 – 2014-09-30 (×4): 4 mg via INTRAVENOUS
  Filled 2014-09-22 (×4): qty 2

## 2014-09-22 MED ORDER — VITAMIN D3 25 MCG (1000 UNIT) PO TABS
2000.0000 [IU] | ORAL_TABLET | Freq: Every day | ORAL | Status: DC
  Administered 2014-09-23 – 2014-10-02 (×9): 2000 [IU] via ORAL
  Filled 2014-09-22 (×10): qty 2

## 2014-09-22 MED ORDER — VITAMIN B-1 100 MG PO TABS
100.0000 mg | ORAL_TABLET | Freq: Every day | ORAL | Status: DC
  Administered 2014-09-22 – 2014-10-02 (×10): 100 mg via ORAL
  Filled 2014-09-22 (×11): qty 1

## 2014-09-22 MED ORDER — FERROUS SULFATE 325 (65 FE) MG PO TABS
325.0000 mg | ORAL_TABLET | Freq: Every day | ORAL | Status: DC
  Filled 2014-09-22: qty 1

## 2014-09-22 MED ORDER — ADULT MULTIVITAMIN W/MINERALS CH
1.0000 | ORAL_TABLET | Freq: Every day | ORAL | Status: DC
  Administered 2014-09-22 – 2014-10-02 (×10): 1 via ORAL
  Filled 2014-09-22 (×11): qty 1

## 2014-09-22 MED ORDER — PANTOPRAZOLE SODIUM 40 MG PO TBEC
80.0000 mg | DELAYED_RELEASE_TABLET | Freq: Every evening | ORAL | Status: DC
  Administered 2014-09-22 – 2014-10-01 (×10): 80 mg via ORAL
  Filled 2014-09-22 (×11): qty 2

## 2014-09-22 MED ORDER — DILTIAZEM HCL ER BEADS 240 MG PO CP24
240.0000 mg | ORAL_CAPSULE | Freq: Every day | ORAL | Status: DC
  Filled 2014-09-22: qty 1

## 2014-09-22 MED ORDER — BUDESONIDE-FORMOTEROL FUMARATE 160-4.5 MCG/ACT IN AERO
2.0000 | INHALATION_SPRAY | Freq: Two times a day (BID) | RESPIRATORY_TRACT | Status: DC
  Administered 2014-09-22 – 2014-10-02 (×20): 2 via RESPIRATORY_TRACT
  Filled 2014-09-22: qty 6

## 2014-09-22 MED ORDER — VITAMIN D3 25 MCG (1000 UNIT) PO TABS
1000.0000 [IU] | ORAL_TABLET | Freq: Every day | ORAL | Status: DC
  Administered 2014-09-22 – 2014-10-01 (×9): 1000 [IU] via ORAL
  Filled 2014-09-22 (×11): qty 1

## 2014-09-22 MED ORDER — MONTELUKAST SODIUM 10 MG PO TABS
10.0000 mg | ORAL_TABLET | Freq: Every day | ORAL | Status: DC
  Administered 2014-09-22 – 2014-10-01 (×9): 10 mg via ORAL
  Filled 2014-09-22 (×11): qty 1

## 2014-09-22 MED ORDER — LEVALBUTEROL HCL 0.63 MG/3ML IN NEBU: 0.6300 mg | INHALATION_SOLUTION | Freq: Four times a day (QID) | RESPIRATORY_TRACT | Status: DC | PRN

## 2014-09-22 MED ORDER — BISACODYL 10 MG RE SUPP: 10.0000 mg | Freq: Every day | RECTAL | Status: DC | PRN

## 2014-09-22 MED ORDER — AMIODARONE HCL 200 MG PO TABS
200.0000 mg | ORAL_TABLET | Freq: Every day | ORAL | Status: DC
  Administered 2014-09-23 – 2014-09-24 (×2): 200 mg via ORAL
  Filled 2014-09-22 (×2): qty 1

## 2014-09-22 MED ORDER — LEVALBUTEROL TARTRATE 45 MCG/ACT IN AERO: 2.0000 | INHALATION_SPRAY | Freq: Four times a day (QID) | RESPIRATORY_TRACT | Status: DC | PRN

## 2014-09-22 MED ORDER — FUROSEMIDE 10 MG/ML IJ SOLN
40.0000 mg | Freq: Once | INTRAMUSCULAR | Status: AC
  Administered 2014-09-22: 40 mg via INTRAVENOUS
  Filled 2014-09-22: qty 4

## 2014-09-22 MED ORDER — RALOXIFENE HCL 60 MG PO TABS
60.0000 mg | ORAL_TABLET | Freq: Every morning | ORAL | Status: DC
  Administered 2014-09-23 – 2014-09-26 (×4): 60 mg via ORAL
  Filled 2014-09-22 (×4): qty 1

## 2014-09-22 MED ORDER — SODIUM CHLORIDE 0.9 % IV SOLN: 250.0000 mL | INTRAVENOUS | Status: DC | PRN

## 2014-09-22 MED ORDER — ATORVASTATIN CALCIUM 40 MG PO TABS
40.0000 mg | ORAL_TABLET | Freq: Every day | ORAL | Status: DC
  Administered 2014-09-22 – 2014-09-23 (×2): 40 mg via ORAL
  Filled 2014-09-22 (×3): qty 1

## 2014-09-22 MED ORDER — ALBUTEROL SULFATE (2.5 MG/3ML) 0.083% IN NEBU: 2.5000 mg | INHALATION_SOLUTION | Freq: Four times a day (QID) | RESPIRATORY_TRACT | Status: DC | PRN

## 2014-09-22 MED ORDER — OXYCODONE HCL 5 MG PO TABS: 5.0000 mg | ORAL_TABLET | ORAL | Status: DC | PRN

## 2014-09-22 MED ORDER — FOLIC ACID 1 MG PO TABS
1.0000 mg | ORAL_TABLET | Freq: Every day | ORAL | Status: DC
  Administered 2014-09-22 – 2014-10-02 (×10): 1 mg via ORAL
  Filled 2014-09-22 (×11): qty 1

## 2014-09-22 MED ORDER — HEPARIN SODIUM (PORCINE) 5000 UNIT/ML IJ SOLN
5000.0000 [IU] | Freq: Three times a day (TID) | INTRAMUSCULAR | Status: DC
  Administered 2014-09-23: 5000 [IU] via SUBCUTANEOUS
  Filled 2014-09-22 (×5): qty 1

## 2014-09-22 MED ORDER — DOCUSATE SODIUM 100 MG PO CAPS
100.0000 mg | ORAL_CAPSULE | Freq: Two times a day (BID) | ORAL | Status: DC
  Administered 2014-09-23 – 2014-10-02 (×11): 100 mg via ORAL
  Filled 2014-09-22 (×21): qty 1

## 2014-09-22 MED ORDER — POLYETHYLENE GLYCOL 3350 17 G PO PACK: 17.0000 g | PACK | Freq: Every day | ORAL | Status: DC | PRN

## 2014-09-22 MED ORDER — CEFTRIAXONE SODIUM IN DEXTROSE 40 MG/ML IV SOLN
2.0000 g | INTRAVENOUS | Status: DC
  Administered 2014-09-22 – 2014-09-23 (×2): 2 g via INTRAVENOUS
  Filled 2014-09-22 (×3): qty 50

## 2014-09-22 MED ORDER — ONDANSETRON HCL 4 MG PO TABS
4.0000 mg | ORAL_TABLET | Freq: Four times a day (QID) | ORAL | Status: DC | PRN
  Administered 2014-09-29 – 2014-10-02 (×2): 4 mg via ORAL
  Filled 2014-09-22 (×2): qty 1

## 2014-09-22 MED ORDER — SODIUM CHLORIDE 0.9 % IJ SOLN
3.0000 mL | Freq: Two times a day (BID) | INTRAMUSCULAR | Status: DC
  Administered 2014-09-23 – 2014-10-01 (×7): 3 mL via INTRAVENOUS

## 2014-09-22 MED ORDER — SODIUM CHLORIDE 0.9 % IJ SOLN
3.0000 mL | INTRAMUSCULAR | Status: DC | PRN
  Administered 2014-09-24: 3 mL via INTRAVENOUS
  Filled 2014-09-22: qty 3

## 2014-09-22 NOTE — ED Provider Notes (Signed)
The patient is an 81 year old female, she has a history of congestive heart failure but had an echocardiogram approximately one year ago which was fairly unremarkable, 55% ejection fraction without any significant diastolic dysfunction. She recently had a paracentesis to remove fluid on her abdomen, this was successful in volume however she did not have much symptomatic relief, was told she had infected fluid and had been put on antibiotics. Of note the patient does have a history of low albumen on Compazine metabolic panel testing. She reports over the last several days she has had increased swelling in her legs and abdomen, she is not feeling dyspneic, lightheaded or having diarrhea. On exam she has a distended abdomen, there is no fluid wave but it is diffusely dull to percussion, there is no significant tenderness or guarding, she is not peritoneal. She has a mild tachycardia but no abnormal lung sounds or murmurs. She does have peripheral edema.  Workup for recurrent fluid overloaded state, anticipate likely admission due to significant fluid retention. Lasix ordered  Medical screening examination/treatment/procedure(s) were conducted as a shared visit with non-physician practitioner(s) and myself.  I personally evaluated the patient during the encounter.  Clinical Impression:   Final diagnoses:  Ascites         Noemi Chapel, MD 09/26/14 1319

## 2014-09-22 NOTE — H&P (Signed)
[facility] History and Physical  [name] [name] UMP:[id number] DOB: 04-23-1923 DOA: 09/22/2014  Referring physician: Irena Cords, PA PCP: [name]   Chief Complaint: Abdominal Distension  HPI: [name] [name] is a 81 y.o. female with history of CKD HTN COPD ascites Atrial Fibrillation on Eliquis presents with increased abdominal distension. Patient was seen last week in the office and was noted to have ascites. She was seen by [facility]. Patient went to [facility] and had a paracentesis. Patient was started on Cipro for a SBP. She has been having nausea and vomiting also. Patient states there was no blood in the vomit. She states it was mostly bilious. Symptoms appeared to get worse over the weekend. In addition there was increased weight. Patient has also noted increased swelling of her legs. There is no prior history of liver disease and her LFTs here in the ED are unremarkable. Patient does have a family history of Hemachromatosis. Patient has not been tested in the past for this. Apparently has a history of CHF in the past but looking at her last EF it was 55% and there was some diastolic dysfunction.   Review of Systems:  Systems reviewed and are unremarkable other than noted in HPI  Past Medical History  Diagnosis Date  . Hypertension   . Hyperlipidemia   . COPD (chronic obstructive pulmonary disease)   . Osteoporosis   . H/O hiatal hernia   . Chronic back pain   . CAD (coronary artery disease)     LHC 9/05:  pLAD less than 20%, D1 20-30%, ostial RCA 40-50%, proximal RCA 20%, EF 60%.  . C. difficile diarrhea   . Gout   . Atrial fibrillation     a. failed DCCV => b. amiodarone added => converted to NSR on Amiodarone;  c. Xarelto d/c'd 2/2 falling (wrist and hip Fx in 1/14)  . Carotid stenosis     s/p bilat CEA  . C. difficile colitis     10/2011;  11/2011  . Salmonella enteritis 08/2011    c/b septic shock  . Hx of echocardiogram     a. Echocardiogram 09/29/11: Mild  LVH, EF 45-50%, diffuse HK, mild to moderate aortic stenosis, mean gradient 12 mmHg, moderate MAC, mild MR, moderate LAE, moderate RAE, question small secundum ASD with left to right flow not seen in 4 chamber view, PASP 35-39 (mild pulmonary hypertension). ;  b.  TEE on 10/04/11: Mild LVH, EF 55%, mild MR, no defect or PFO  . Pneumonia 11/2101  . PONV (postoperative nausea and vomiting)   . CHF (congestive heart failure)   . Chronic kidney disease   . GERD (gastroesophageal reflux disease)   . Hepatitis   . Anemia   . Hx of echocardiogram     Echo (09/2013): EF 55%, normal wall motion, mild aortic stenosis (mean 12 mm Hg), MAC, mild MR, mild LAE, mild PI, PASP 30 mm Hg  . Hx of cardiovascular stress test     Lexiscan Myoview (7/15):  No ischemia or infarct, EF 50%, Low Risk  . Arthritis     Neck  . PAF (paroxysmal atrial fibrillation)    Past Surgical History  Procedure Laterality Date  . Carotid endarterectomy      bilateral  . Cataract extraction, bilateral    . Joint replacement  2008    Right Total Knee  . Anal fistula repair    . Left parotidectomy    . Eye surgery    . Flexible sigmoidoscopy  09/03/2011    Procedure: FLEXIBLE SIGMOIDOSCOPY;  Surgeon: Beryle Beams, MD;  Location: [facility];  Service: Endoscopy;  Laterality: N/A;  . Tee without cardioversion  10/04/2011    Procedure: TRANSESOPHAGEAL ECHOCARDIOGRAM (TEE);  Surgeon: Larey Dresser, MD;  Location: [facility];  Service: Cardiovascular;  Laterality: N/A;  to be carelinked here by 1230-verified 7/29/dl  . Cardioversion  10/04/2011    Procedure: CARDIOVERSION;  Surgeon: Larey Dresser, MD;  Location: [facility];  Service: Cardiovascular;  Laterality: N/A;  . Esophagogastroduodenoscopy  11/29/2011    Procedure: ESOPHAGOGASTRODUODENOSCOPY (EGD);  Surgeon: Cleotis Nipper, MD;  Location: [facility];  Service: Endoscopy;  Laterality: N/A;  recent h/o resp failure/pneumonia  . Left hip fracture repair  03/14/12  .  Open reduction internal fixation (orif) scaphoid with distal radius graft  03/14/2012    Procedure: OPEN REDUCTION INTERNAL FIXATION (ORIF) SCAPHOID WITH DISTAL RADIUS GRAFT;  Surgeon: Jolyn Nap, MD;  Location: [facility];  Service: Orthopedics;  Laterality: Left;  DVR wrist fracture set/ hand innovation  . Hip pinning,cannulated  03/14/2012    Procedure: CANNULATED HIP PINNING;  Surgeon: Jolyn Nap, MD;  Location: [facility];  Service: Orthopedics;  Laterality: Left;  Biomet 6.5 cannulated screws  . Femur im nail Left 04/13/2012    Procedure: INTRAMEDULLARY (IM) NAIL FEMORAL;  Surgeon: Jolyn Nap, MD;  Location: [facility];  Service: Orthopedics;  Laterality: Left;  OPEN REDUCTION INTERNAL FIXATION LEFT PROXIMAL FEMUR Fracture    Social History:  reports that she quit smoking about 31 years ago. Her smoking use included Cigarettes. She has a 20 pack-year smoking history. She has never used smokeless tobacco. She reports that she does not drink alcohol or use illicit drugs.  Allergies  Allergen Reactions  . Baby Powder [Methylbenzethonium] Shortness Of Breath  . Demerol [Meperidine] Hives  . Fexofenadine Other (See Comments)    dizziness  . Penicillins Hives  . Spiriva Handihaler [Tiotropium Bromide Monohydrate]     Caused chest pains    Family History  Problem Relation Age of Onset  . Hemochromatosis Sister   . Diabetes Sister   . Hypertension Sister   . Diabetes type II Mother   . Diabetes Mother   . Hypertension Mother   . Deep vein thrombosis Mother   . Hypertension Father   . Hypertension Brother   . Other Brother     Leg amputation-Gun [place]  . Depression Brother   . Hemochromatosis Sister   . Heart attack Neg Hx   . Stroke Neg Hx   . Cancer Sister      Prior to Admission medications   Medication Sig Start Date End Date Taking? Authorizing Provider  allopurinol (ZYLOPRIM) 100 MG tablet Take 100 mg by mouth every morning.    Yes Historical Provider, MD  amiodarone  (PACERONE) 200 MG tablet Take 1 tablet (200 mg total) by mouth daily. 04/07/14  Yes Erlene Quan, PA-C  apixaban (ELIQUIS) 2.5 MG TABS tablet Take 1 tablet (2.5 mg total) by mouth 2 (two) times daily. 04/14/14  Yes Burnell Blanks, MD  atorvastatin (LIPITOR) 40 MG tablet Take 40 mg by mouth at bedtime.   Yes Historical Provider, MD  budesonide-formoterol (SYMBICORT) 160-4.5 MCG/ACT inhaler Inhale 2 puffs into the lungs 2 (two) times daily.   Yes Historical Provider, MD  cholecalciferol (VITAMIN D) 1000 UNITS tablet Take 1,000-2,000 Units by mouth 2 (two) times daily. Take 2000 units in the morning and 1000 units in the evening  Yes Historical Provider, MD  Ciprofloxacin (CIPRO PO) Take 1 tablet by mouth 2 (two) times daily.   Yes Historical Provider, MD  diltiazem (TIAZAC) 240 MG 24 hr capsule Take 240 mg by mouth daily.   Yes Historical Provider, MD  Docusate Calcium (STOOL SOFTENER PO) Take 1 tablet by mouth as needed (constipation).   Yes Historical Provider, MD  ferrous sulfate 325 (65 FE) MG tablet Take 325 mg by mouth daily.    Yes Historical Provider, MD  furosemide (LASIX) 20 MG tablet Take 1 tablet (20 mg total) by mouth daily. Patient taking differently: Take 20-40 mg by mouth daily as needed for fluid or edema.  07/25/14  Yes Burnell Blanks, MD  levalbuterol [facility] HFA) 45 MCG/ACT inhaler Inhale 2 puffs into the lungs every 6 (six) hours as needed for wheezing.    Yes Historical Provider, MD  levalbuterol Penne Lash) 0.63 MG/3ML nebulizer solution Take 0.63 mg by nebulization every 4 (four) hours as needed for wheezing or shortness of breath.   Yes Historical Provider, MD  montelukast (SINGULAIR) 10 MG tablet Take 10 mg by mouth at bedtime.   Yes Historical Provider, MD  omeprazole (PRILOSEC) 40 MG capsule Take 40 mg by mouth every evening.   Yes Historical Provider, MD  polyethylene glycol (MIRALAX / GLYCOLAX) packet Take 17 g by mouth daily as needed for mild constipation.    Yes Historical Provider, MD  raloxifene (EVISTA) 60 MG tablet Take 60 mg by mouth every morning.    Yes Historical Provider, MD  Saccharomyces boulardii (FLORASTOR PO) Take 1 tablet by mouth 2 (two) times daily.   Yes Historical Provider, MD  meclizine (ANTIVERT) 25 MG tablet Take 1 tablet (25 mg total) by mouth 2 (two) times daily as needed for dizziness. Patient not taking: Reported on 09/12/2014 07/06/13   Merryl Hacker, MD   Physical Exam: Filed Vitals:   09/22/14 1520 09/22/14 1723 09/22/14 1800 09/22/14 1900  BP: 96/48 114/65 128/64 123/66  Pulse: 101 103 89 40  Temp: 98.5 F (36.9 C)     TempSrc: Oral     Resp: 18 22 22 20   SpO2: 95% 88% 92% 89%    Wt Readings from Last 3 Encounters:  06/24/14 78 kg (171 lb 15.3 oz)  06/06/14 79.379 kg (175 lb)  04/28/14 78.019 kg (172 lb)    General:  Appears calm and comfortable Eyes: PERRL, normal lids, irises & conjunctiva ENT: grossly normal hearing, lips & tongue Neck: no LAD, masses or thyromegaly Cardiovascular: RRR, +murmer. ++LE edema. Respiratory: CTA bilaterally, no w/r/r Abdomen: soft, Distended +Ascites Fluid Thrill Skin: Chronic Venous changes Musculoskeletal: grossly normal tone BUE/BLE +arthritis changes noted Both hands Psychiatric: grossly normal mood and affect Neurologic: grossly non-focal.          Labs on Admission:  Basic Metabolic Panel:  Recent Labs Lab 09/22/14 1654  NA 136  K 3.8  CL 100*  CO2 27  GLUCOSE 95  BUN 30*  CREATININE 2.04*  CALCIUM 8.4*   Liver Function Tests:  Recent Labs Lab 09/22/14 1654  AST 24  ALT 17  ALKPHOS 64  BILITOT 0.5  PROT 5.9*  ALBUMIN 2.5*    Recent Labs Lab 09/22/14 1654  LIPASE 29   No results for input(s): AMMONIA in the last 168 hours. CBC:  Recent Labs Lab 09/22/14 1654  WBC 9.9  NEUTROABS 8.6*  HGB 11.2*  HCT 34.7*  MCV 89.2  PLT 274   Cardiac Enzymes: No results for  input(s): CKTOTAL, CKMB, CKMBINDEX, TROPONINI in the last 168  hours.  BNP (last 3 results)  Recent Labs  02/26/14 1550 09/12/14 1448 09/22/14 1701  BNP 292.3* 360.2* 165.9*    ProBNP (last 3 results) No results for input(s): PROBNP in the last 8760 hours.  CBG: No results for input(s): GLUCAP in the last 168 hours.  Radiological Exams on Admission: No results found.    Assessment/Plan Principal Problem:   SBP (spontaneous bacterial peritonitis) Active Problems:   Hyperlipidemia   Atrial fibrillation   Chronic combined systolic and diastolic heart failure   Hypertension   CKD (chronic kidney disease), stage III   1. (possible) Spontaneous Bacterial Peritonitis -had outpatient paracentesis results of which are suggestive of SBP. Started on oral antibiotics but will need IV antibiotics -she had been on cipro as an outpatient -will start on rocephin 2G daily as there is a shortage of cefotaxime -will schedule for paracentesis  2. Chronic Atrial Fibrillation -will continue cardizem -hold eliquis as she is going to need paracentesis -continue with amiodarone  3. Chronic Combined Systolic Diastolic Heart Failure -will repeat Echo -last echo EF noted to be 55%  4. CKD III -will monitor renal function -given lasix in ED will hold for now due to elevated renal function from baseline -will monitor strict IOs  5. HTN -Will be continued on diltiazem for now -monitor pressures  6. Hyperlipidemia -will be continued on statins -check lipid panel  7. Chronic Anemia -will check iron studies -continue with iron supplementation  8. COPD -will continue with albuterol nebs as needed -continue with singulair -continue symbicort  9. Arthritis Rheumatoid -will check RF ANA   Code Status: Full Code (must indicate code status--if unknown or must be presumed, indicate so) DVT Prophylaxis:Heparin Family Communication: none (indicate person spoken with, if applicable, with phone number if by telephone) Disposition Plan: home  (indicate anticipated LOS)  Time spent: 86min  KHAN,SAADAT A [facility] Pager [phone]

## 2014-09-22 NOTE — ED Notes (Signed)
Pt was seen before and diagnosed with ascites.  Pt states that she had been seen out on Friday pt for removal of excess fluid off her abd.  Pt started having abd swelling since Saturday.

## 2014-09-22 NOTE — ED Notes (Signed)
Pt aware urine sample is needed. Cannot use restroom at this time

## 2014-09-22 NOTE — ED Notes (Addendum)
2x unsuccessful lab draw, RN Terrence aware. RN Macon at bedside.

## 2014-09-22 NOTE — Progress Notes (Signed)
ANTIBIOTIC CONSULT NOTE - INITIAL  Pharmacy Consult for Ceftriaxone Indication: Intra-abdominal infection (SBP)  Allergies  Allergen Reactions  . Baby Powder [Methylbenzethonium] Shortness Of Breath  . Demerol [Meperidine] Hives  . Fexofenadine Other (See Comments)    dizziness  . Penicillins Hives  . Spiriva Handihaler [Tiotropium Bromide Monohydrate]     Caused chest pains    Patient Measurements: Height: 5\' 1"  (154.9 cm) Weight: 160 lb (72.576 kg) IBW/kg (Calculated) : 47.8  Vital Signs: Temp: 98.2 F (36.8 C) (07/18 2015) Temp Source: Oral (07/18 2015) BP: 108/72 mmHg (07/18 2015) Pulse Rate: 65 (07/18 2015) Intake/Output from previous day:   Intake/Output from this shift:    Labs:  Recent Labs  09/22/14 1654  WBC 9.9  HGB 11.2*  PLT 274  CREATININE 2.04*   Estimated Creatinine Clearance: 17.4 mL/min (by C-G formula based on Cr of 2.04). No results for input(s): VANCOTROUGH, VANCOPEAK, VANCORANDOM, GENTTROUGH, GENTPEAK, GENTRANDOM, TOBRATROUGH, TOBRAPEAK, TOBRARND, AMIKACINPEAK, AMIKACINTROU, AMIKACIN in the last 72 hours.   Microbiology: Recent Results (from the past 720 hour(s))  Urine culture     Status: None   Collection Time: 09/12/14  2:56 PM  Result Value Ref Range Status   Specimen Description URINE, CLEAN CATCH  Final   Special Requests NONE  Final   Culture   Final    >=100,000 COLONIES/mL ESCHERICHIA COLI Performed at [facility]    Report Status 09/15/2014 FINAL  Final   Organism ID, Bacteria ESCHERICHIA COLI  Final      Susceptibility   Escherichia coli - MIC*    AMPICILLIN <=2 SENSITIVE Sensitive     CEFAZOLIN <=4 SENSITIVE Sensitive     CEFTRIAXONE <=1 SENSITIVE Sensitive     CIPROFLOXACIN >=4 RESISTANT Resistant     GENTAMICIN <=1 SENSITIVE Sensitive     IMIPENEM <=0.25 SENSITIVE Sensitive     NITROFURANTOIN <=16 SENSITIVE Sensitive     TRIMETH/SULFA <=20 SENSITIVE Sensitive     AMPICILLIN/SULBACTAM <=2 SENSITIVE  Sensitive     PIP/TAZO <=4 SENSITIVE Sensitive     * >=100,000 COLONIES/mL ESCHERICHIA COLI  Culture, body fluid-bottle     Status: None (Preliminary result)   Collection Time: 09/18/14 12:33 PM  Result Value Ref Range Status   Specimen Description FLUID ABDOMEN PERITONEAL  Final   Special Requests BAA 5MLS  Final   Culture NO GROWTH 4 DAYS  Final   Report Status PENDING  Incomplete  Gram stain     Status: None   Collection Time: 09/18/14 12:33 PM  Result Value Ref Range Status   Specimen Description FLUID ABDOMEN PERITONEAL  Final   Special Requests NONE  Final   Gram Stain   Final    MODERATE WBC PRESENT, PREDOMINANTLY PMN NO ORGANISMS SEEN    Report Status 09/19/2014 FINAL  Final    Medical History: Past Medical History  Diagnosis Date  . Hypertension   . Hyperlipidemia   . COPD (chronic obstructive pulmonary disease)   . Osteoporosis   . H/O hiatal hernia   . Chronic back pain   . CAD (coronary artery disease)     LHC 9/05:  pLAD less than 20%, D1 20-30%, ostial RCA 40-50%, proximal RCA 20%, EF 60%.  . C. difficile diarrhea   . Gout   . Atrial fibrillation     a. failed DCCV => b. amiodarone added => converted to NSR on Amiodarone;  c. Xarelto d/c'd 2/2 falling (wrist and hip Fx in 1/14)  . Carotid stenosis  s/p bilat CEA  . C. difficile colitis     10/2011;  11/2011  . Salmonella enteritis 08/2011    c/b septic shock  . Hx of echocardiogram     a. Echocardiogram 09/29/11: Mild LVH, EF 45-50%, diffuse HK, mild to moderate aortic stenosis, mean gradient 12 mmHg, moderate MAC, mild MR, moderate LAE, moderate RAE, question small secundum ASD with left to right flow not seen in 4 chamber view, PASP 35-39 (mild pulmonary hypertension). ;  b.  TEE on 10/04/11: Mild LVH, EF 55%, mild MR, no defect or PFO  . Pneumonia 11/2101  . PONV (postoperative nausea and vomiting)   . CHF (congestive heart failure)   . Chronic kidney disease   . GERD (gastroesophageal reflux disease)    . Hepatitis   . Anemia   . Hx of echocardiogram     Echo (09/2013): EF 55%, normal wall motion, mild aortic stenosis (mean 12 mm Hg), MAC, mild MR, mild LAE, mild PI, PASP 30 mm Hg  . Hx of cardiovascular stress test     Lexiscan Myoview (7/15):  No ischemia or infarct, EF 50%, Low Risk  . Arthritis     Neck  . PAF (paroxysmal atrial fibrillation)     Medications:  Scheduled:  . [START ON 09/23/2014] allopurinol  100 mg Oral q morning - 10a  . [START ON 09/23/2014] amiodarone  200 mg Oral Daily  . atorvastatin  40 mg Oral QHS  . budesonide-formoterol  2 puff Inhalation BID  . cefTRIAXone (ROCEPHIN)  IV  2 g Intravenous Q24H  . cholecalciferol  1,000-2,000 Units Oral BID  . [START ON 09/23/2014] diltiazem  240 mg Oral Daily  . docusate sodium  100 mg Oral BID  . [START ON 09/23/2014] ferrous sulfate  325 mg Oral Q lunch  . folic acid  1 mg Oral Daily  . heparin  5,000 Units Subcutaneous 3 times per day  . montelukast  10 mg Oral QHS  . multivitamin with minerals  1 tablet Oral Daily  . pantoprazole  80 mg Oral QPM  . [START ON 09/23/2014] raloxifene  60 mg Oral q morning - 10a  . saccharomyces boulardii  250 mg Oral BID  . sodium chloride  3 mL Intravenous Q12H  . thiamine  100 mg Oral Daily   Infusions:   PRN: sodium chloride, albuterol, bisacodyl, levalbuterol, ondansetron **OR** ondansetron (ZOFRAN) IV, oxyCODONE, polyethylene glycol, sodium chloride  Assessment: Pharmacy is consulted to dose ceftriaxone for this 81 year old female with SBP.  Noted that patient has a listed allergy of hives to penicillin.  Reviewed antibiotic history in Epic, no record of penicillin or cephalosporin administration given inpatient.  Alternative antibiotic is levofloxacin, but patient was on ciprofloxacin PTA, therefore, resistance is a concern.   Discussed with Dr. Humphrey Rolls, since cross-reactivity is <10%, will proceed with ceftriaxone and monitor closely for reactions.  Goal of Therapy:   Eradication of infection  Plan:   Ceftriaxone 2g IV q24h  Monitor patient closely for adverse reactions Follow up culture data if ordered  Peggyann Juba, PharmD, BCPS Pager: [phone] 09/22/2014,8:59 PM

## 2014-09-23 ENCOUNTER — Inpatient Hospital Stay (HOSPITAL_COMMUNITY): Payer: Medicare Other

## 2014-09-23 DIAGNOSIS — I509 Heart failure, unspecified: Secondary | ICD-10-CM

## 2014-09-23 LAB — COMPREHENSIVE METABOLIC PANEL
ALBUMIN: 2.3 g/dL — AB (ref 3.5–5.0)
ALT: 14 U/L (ref 14–54)
AST: 20 U/L (ref 15–41)
Alkaline Phosphatase: 70 U/L (ref 38–126)
Anion gap: 9 (ref 5–15)
BUN: 30 mg/dL — AB (ref 6–20)
CO2: 28 mmol/L (ref 22–32)
Calcium: 8.1 mg/dL — ABNORMAL LOW (ref 8.9–10.3)
Chloride: 101 mmol/L (ref 101–111)
Creatinine, Ser: 1.93 mg/dL — ABNORMAL HIGH (ref 0.44–1.00)
GFR calc Af Amer: 26 mL/min — ABNORMAL LOW (ref >60)
GFR calc non Af Amer: 22 mL/min — ABNORMAL LOW (ref >60)
GLUCOSE: 99 mg/dL (ref 65–99)
Potassium: 3.5 mmol/L (ref 3.5–5.1)
Sodium: 138 mmol/L (ref 135–145)
TOTAL PROTEIN: 5.4 g/dL — AB (ref 6.5–8.1)
Total Bilirubin: 0.4 mg/dL (ref 0.3–1.2)

## 2014-09-23 LAB — CULTURE, BODY FLUID-BOTTLE: Culture: NO GROWTH

## 2014-09-23 LAB — LIPID PANEL
CHOL/HDL RATIO: 2.3 ratio
Cholesterol: 120 mg/dL (ref 0–200)
HDL: 53 mg/dL (ref >40)
LDL CALC: 48 mg/dL (ref 0–99)
Triglycerides: 94 mg/dL (ref <150)
VLDL: 19 mg/dL (ref 0–40)

## 2014-09-23 LAB — GRAM STAIN

## 2014-09-23 LAB — IRON AND TIBC
IRON: 12 ug/dL — AB (ref 28–170)
SATURATION RATIOS: 6 % — AB (ref 10.4–31.8)
TIBC: 199 ug/dL — ABNORMAL LOW (ref 250–450)
UIBC: 187 ug/dL

## 2014-09-23 LAB — APTT
APTT: 34 s (ref 24–37)
aPTT: 36 seconds (ref 24–37)

## 2014-09-23 LAB — BODY FLUID CELL COUNT WITH DIFFERENTIAL
Lymphs, Fluid: 34 %
MONOCYTE-MACROPHAGE-SEROUS FLUID: 36 % — AB (ref 50–90)
Neutrophil Count, Fluid: 30 % — ABNORMAL HIGH (ref 0–25)
Total Nucleated Cell Count, Fluid: 1231 cu mm — ABNORMAL HIGH (ref 0–1000)

## 2014-09-23 LAB — CBC
HEMATOCRIT: 33.1 % — AB (ref 36.0–46.0)
HEMOGLOBIN: 10.6 g/dL — AB (ref 12.0–15.0)
MCH: 28.6 pg (ref 26.0–34.0)
MCHC: 32 g/dL (ref 30.0–36.0)
MCV: 89.5 fL (ref 78.0–100.0)
Platelets: 281 10*3/uL (ref 150–400)
RBC: 3.7 MIL/uL — ABNORMAL LOW (ref 3.87–5.11)
RDW: 15.1 % (ref 11.5–15.5)
WBC: 7.7 10*3/uL (ref 4.0–10.5)

## 2014-09-23 LAB — LACTATE DEHYDROGENASE, PLEURAL OR PERITONEAL FLUID: LD FL: 374 U/L — AB (ref 3–23)

## 2014-09-23 LAB — GLUCOSE, CAPILLARY: GLUCOSE-CAPILLARY: 101 mg/dL — AB (ref 65–99)

## 2014-09-23 LAB — VITAMIN B12: Vitamin B-12: 587 pg/mL (ref 180–914)

## 2014-09-23 LAB — PHOSPHORUS: Phosphorus: 3.6 mg/dL (ref 2.5–4.6)

## 2014-09-23 LAB — GLUCOSE, SEROUS FLUID: Glucose, Fluid: 81 mg/dL

## 2014-09-23 LAB — MAGNESIUM: MAGNESIUM: 2 mg/dL (ref 1.7–2.4)

## 2014-09-23 LAB — FERRITIN: Ferritin: 55 ng/mL (ref 11–307)

## 2014-09-23 LAB — CULTURE, BODY FLUID W GRAM STAIN -BOTTLE

## 2014-09-23 LAB — PROTIME-INR
INR: 1.36 (ref 0.00–1.49)
Prothrombin Time: 16.9 seconds — ABNORMAL HIGH (ref 11.6–15.2)

## 2014-09-23 LAB — PROTEIN, BODY FLUID: TOTAL PROTEIN, FLUID: 3 g/dL

## 2014-09-23 MED ORDER — MORPHINE SULFATE 2 MG/ML IJ SOLN
1.0000 mg | INTRAMUSCULAR | Status: DC | PRN
  Administered 2014-09-24 (×2): 2 mg via INTRAVENOUS
  Administered 2014-10-01: 1 mg via INTRAVENOUS
  Filled 2014-09-23 (×3): qty 1

## 2014-09-23 MED ORDER — SODIUM CHLORIDE 0.9 % IV SOLN: INTRAVENOUS | Status: DC

## 2014-09-23 MED ORDER — HEPARIN (PORCINE) IN NACL 100-0.45 UNIT/ML-% IJ SOLN
900.0000 [IU]/h | INTRAMUSCULAR | Status: DC
  Administered 2014-09-23: 900 [IU]/h via INTRAVENOUS
  Filled 2014-09-23: qty 250

## 2014-09-23 NOTE — ED Provider Notes (Signed)
CSN: [id number]     Arrival date & time 09/22/14  1508 History   First MD Initiated Contact with Patient 09/22/14 1559     Chief Complaint  Patient presents with  . Ascites     (Consider location/radiation/quality/duration/timing/severity/associated sxs/prior Treatment) HPI Patient presents to the emergency department with ascites.  Her abdomen.  The patient states that she had this recently drained by her GI doctor and was placed on Lasix and she states that the ascites Reaccumulated.  Patient, states she is having discomfort throughout her abdomen.  The patient denies nausea, vomiting, weakness, dizziness, headache, blurred vision, back pain, neck pain, fever, near syncope or syncope.  The patient states that nothing seems make her condition better or worse.  Patient was told to come here by her GI doctor Past Medical History  Diagnosis Date  . Hypertension   . Hyperlipidemia   . COPD (chronic obstructive pulmonary disease)   . Osteoporosis   . H/O hiatal hernia   . Chronic back pain   . CAD (coronary artery disease)     LHC 9/05:  pLAD less than 20%, D1 20-30%, ostial RCA 40-50%, proximal RCA 20%, EF 60%.  . C. difficile diarrhea   . Gout   . Atrial fibrillation     a. failed DCCV => b. amiodarone added => converted to NSR on Amiodarone;  c. Xarelto d/c'd 2/2 falling (wrist and hip Fx in 1/14)  . Carotid stenosis     s/p bilat CEA  . C. difficile colitis     10/2011;  11/2011  . Salmonella enteritis 08/2011    c/b septic shock  . Hx of echocardiogram     a. Echocardiogram 09/29/11: Mild LVH, EF 45-50%, diffuse HK, mild to moderate aortic stenosis, mean gradient 12 mmHg, moderate MAC, mild MR, moderate LAE, moderate RAE, question small secundum ASD with left to right flow not seen in 4 chamber view, PASP 35-39 (mild pulmonary hypertension). ;  b.  TEE on 10/04/11: Mild LVH, EF 55%, mild MR, no defect or PFO  . Pneumonia 11/2101  . PONV (postoperative nausea and vomiting)   . CHF  (congestive heart failure)   . Chronic kidney disease   . GERD (gastroesophageal reflux disease)   . Hepatitis   . Anemia   . Hx of echocardiogram     Echo (09/2013): EF 55%, normal wall motion, mild aortic stenosis (mean 12 mm Hg), MAC, mild MR, mild LAE, mild PI, PASP 30 mm Hg  . Hx of cardiovascular stress test     Lexiscan Myoview (7/15):  No ischemia or infarct, EF 50%, Low Risk  . Arthritis     Neck  . PAF (paroxysmal atrial fibrillation)    Past Surgical History  Procedure Laterality Date  . Carotid endarterectomy      bilateral  . Cataract extraction, bilateral    . Joint replacement  2008    Right Total Knee  . Anal fistula repair    . Left parotidectomy    . Eye surgery    . Flexible sigmoidoscopy  09/03/2011    Procedure: FLEXIBLE SIGMOIDOSCOPY;  Surgeon: Beryle Beams, MD;  Location: [facility];  Service: Endoscopy;  Laterality: N/A;  . Tee without cardioversion  10/04/2011    Procedure: TRANSESOPHAGEAL ECHOCARDIOGRAM (TEE);  Surgeon: Larey Dresser, MD;  Location: [facility];  Service: Cardiovascular;  Laterality: N/A;  to be carelinked here by 1230-verified 7/29/dl  . Cardioversion  10/04/2011    Procedure: CARDIOVERSION;  Surgeon: Kirk Ruths  Claris Gladden, MD;  Location: [facility] ENDOSCOPY;  Service: Cardiovascular;  Laterality: N/A;  . Esophagogastroduodenoscopy  11/29/2011    Procedure: ESOPHAGOGASTRODUODENOSCOPY (EGD);  Surgeon: Cleotis Nipper, MD;  Location: [facility];  Service: Endoscopy;  Laterality: N/A;  recent h/o resp failure/pneumonia  . Left hip fracture repair  03/14/12  . Open reduction internal fixation (orif) scaphoid with distal radius graft  03/14/2012    Procedure: OPEN REDUCTION INTERNAL FIXATION (ORIF) SCAPHOID WITH DISTAL RADIUS GRAFT;  Surgeon: Jolyn Nap, MD;  Location: [facility];  Service: Orthopedics;  Laterality: Left;  DVR wrist fracture set/ hand innovation  . Hip pinning,cannulated  03/14/2012    Procedure: CANNULATED HIP PINNING;  Surgeon: Jolyn Nap, MD;  Location: [facility];  Service: Orthopedics;  Laterality: Left;  Biomet 6.5 cannulated screws  . Femur im nail Left 04/13/2012    Procedure: INTRAMEDULLARY (IM) NAIL FEMORAL;  Surgeon: Jolyn Nap, MD;  Location: [place] [facility];  Service: Orthopedics;  Laterality: Left;  OPEN REDUCTION INTERNAL FIXATION LEFT PROXIMAL FEMUR Fracture    Family History  Problem Relation Age of Onset  . Hemochromatosis Sister   . Diabetes Sister   . Hypertension Sister   . Diabetes type II Mother   . Diabetes Mother   . Hypertension Mother   . Deep vein thrombosis Mother   . Hypertension Father   . Hypertension Brother   . Other Brother     Leg amputation-Gun shot  . Depression Brother   . Hemochromatosis Sister   . Heart attack Neg Hx   . Stroke Neg Hx   . Cancer Sister    History  Substance Use Topics  . Smoking status: Former Smoker -- 1.00 packs/day for 20 years    Types: Cigarettes    Quit date: 09/01/1983  . Smokeless tobacco: Never Used  . Alcohol Use: No   OB History    No data available     Review of Systems  All other systems negative except as documented in the HPI. All pertinent positives and negatives as reviewed in the HPI.  Allergies  Baby powder; Demerol; Fexofenadine; Penicillins; and Spiriva handihaler  Home Medications   Prior to Admission medications   Medication Sig Start Date End Date Taking? Authorizing Provider  allopurinol (ZYLOPRIM) 100 MG tablet Take 100 mg by mouth every morning.    Yes Historical Provider, MD  amiodarone (PACERONE) 200 MG tablet Take 1 tablet (200 mg total) by mouth daily. 04/07/14  Yes Erlene Quan, PA-C  apixaban (ELIQUIS) 2.5 MG TABS tablet Take 1 tablet (2.5 mg total) by mouth 2 (two) times daily. 04/14/14  Yes Burnell Blanks, MD  atorvastatin (LIPITOR) 40 MG tablet Take 40 mg by mouth at bedtime.   Yes Historical Provider, MD  budesonide-formoterol (SYMBICORT) 160-4.5 MCG/ACT inhaler Inhale 2 puffs into the lungs 2 (two)  times daily.   Yes Historical Provider, MD  cholecalciferol (VITAMIN D) 1000 UNITS tablet Take 1,000-2,000 Units by mouth 2 (two) times daily. Take 2000 units in the morning and 1000 units in the evening   Yes Historical Provider, MD  Ciprofloxacin (CIPRO PO) Take 1 tablet by mouth 2 (two) times daily.   Yes Historical Provider, MD  diltiazem (TIAZAC) 240 MG 24 hr capsule Take 240 mg by mouth daily.   Yes Historical Provider, MD  Docusate Calcium (STOOL SOFTENER PO) Take 1 tablet by mouth as needed (constipation).   Yes Historical Provider, MD  ferrous sulfate 325 (65 FE) MG tablet Take  325 mg by mouth daily.    Yes Historical Provider, MD  furosemide (LASIX) 20 MG tablet Take 1 tablet (20 mg total) by mouth daily. Patient taking differently: Take 20-40 mg by mouth daily as needed for fluid or edema.  07/25/14  Yes Burnell Blanks, MD  levalbuterol [facility] HFA) 45 MCG/ACT inhaler Inhale 2 puffs into the lungs every 6 (six) hours as needed for wheezing.    Yes Historical Provider, MD  levalbuterol Penne Lash) 0.63 MG/3ML nebulizer solution Take 0.63 mg by nebulization every 4 (four) hours as needed for wheezing or shortness of breath.   Yes Historical Provider, MD  montelukast (SINGULAIR) 10 MG tablet Take 10 mg by mouth at bedtime.   Yes Historical Provider, MD  omeprazole (PRILOSEC) 40 MG capsule Take 40 mg by mouth every evening.   Yes Historical Provider, MD  polyethylene glycol (MIRALAX / GLYCOLAX) packet Take 17 g by mouth daily as needed for mild constipation.   Yes Historical Provider, MD  raloxifene (EVISTA) 60 MG tablet Take 60 mg by mouth every morning.    Yes Historical Provider, MD  Saccharomyces boulardii (FLORASTOR PO) Take 1 tablet by mouth 2 (two) times daily.   Yes Historical Provider, MD  meclizine (ANTIVERT) 25 MG tablet Take 1 tablet (25 mg total) by mouth 2 (two) times daily as needed for dizziness. Patient not taking: Reported on 09/12/2014 07/06/13   Merryl Hacker, MD    BP 108/72 mmHg  Pulse 65  Temp(Src) 98.2 F (36.8 C) (Oral)  Resp 24  Ht 5\' 1"  (1.549 m)  Wt 160 lb (72.576 kg)  BMI 30.25 kg/m2  SpO2 93% Physical Exam  Constitutional: She is oriented to person, place, and time. She appears well-developed and well-nourished. No distress.  HENT:  Head: Normocephalic and atraumatic.  Mouth/Throat: Oropharynx is clear and moist.  Eyes: Pupils are equal, round, and reactive to light.  Neck: Normal range of motion. Neck supple.  Cardiovascular: Normal rate, regular rhythm, normal heart sounds and intact distal pulses.  Exam reveals no gallop and no friction rub.   No murmur heard. Pulmonary/Chest: Effort normal and breath sounds normal. No respiratory distress.  Neurological: She is alert and oriented to person, place, and time. She exhibits normal muscle tone. Coordination normal.  Skin: Skin is warm and dry. No rash noted. No erythema.  Nursing note and vitals reviewed.   ED Course  Procedures (including critical care time) Labs Review Labs Reviewed  COMPREHENSIVE METABOLIC PANEL - Abnormal; Notable for the following:    Chloride 100 (*)    BUN 30 (*)    Creatinine, Ser 2.04 (*)    Calcium 8.4 (*)    Total Protein 5.9 (*)    Albumin 2.5 (*)    GFR calc non Af Amer 21 (*)    GFR calc Af Amer 24 (*)    All other components within normal limits  CBC WITH DIFFERENTIAL/PLATELET - Abnormal; Notable for the following:    Hemoglobin 11.2 (*)    HCT 34.7 (*)    Neutrophils Relative % 86 (*)    Neutro Abs 8.6 (*)    Lymphocytes Relative 8 (*)    All other components within normal limits  URINALYSIS, ROUTINE W REFLEX MICROSCOPIC (NOT AT [facility]) - Abnormal; Notable for the following:    APPearance CLOUDY (*)    Bilirubin Urine SMALL (*)    Leukocytes, UA TRACE (*)    All other components within normal limits  BRAIN NATRIURETIC PEPTIDE -  Abnormal; Notable for the following:    B Natriuretic Peptide 165.9 (*)    All other components within  normal limits  URINE MICROSCOPIC-ADD ON - Abnormal; Notable for the following:    Squamous Epithelial / LPF MANY (*)    Casts HYALINE CASTS (*)    All other components within normal limits  CBC - Abnormal; Notable for the following:    RBC 3.77 (*)    Hemoglobin 10.8 (*)    HCT 33.8 (*)    All other components within normal limits  CREATININE, SERUM - Abnormal; Notable for the following:    Creatinine, Ser 1.97 (*)    GFR calc non Af Amer 22 (*)    GFR calc Af Amer 25 (*)    All other components within normal limits  IRON AND TIBC - Abnormal; Notable for the following:    Iron 12 (*)    TIBC 199 (*)    Saturation Ratios 6 (*)    All other components within normal limits  SEDIMENTATION RATE - Abnormal; Notable for the following:    Sed Rate 55 (*)    All other components within normal limits  LIPASE, BLOOD  TSH  LIPID PANEL  FERRITIN  VITAMIN B12  PHOSPHORUS  MAGNESIUM  HEMOGLOBIN A1C  CBC  COMPREHENSIVE METABOLIC PANEL  PROTIME-INR  APTT  FOLATE RBC  OCCULT BLOOD X 1 CARD TO LAB, STOOL  CERULOPLASMIN  ANTINUCLEAR ANTIBODIES, IFA  RHEUMATOID FACTOR    Patient be admitted to the hospital for further evaluation and care.  Attempted to get an IR drain of this ascites, but they were unable to do so before they left for the day    Dalia Heading, PA-C 09/25/14 0102  Noemi Chapel, MD 09/26/14 [phone]

## 2014-09-23 NOTE — Consult Note (Signed)
Referring Provider: Dr. Reggy Eye Primary Care Physician:  [name] Primary Gastroenterologist:  Dr. Cristina Gong  Reason for Consultation:  Ascites  HPI: [name] [name] is a 81 y.o. female seen last week in our office by her physician assistant because of fluid retention. She was sent for a therapeutic paracentesis, which showed a very high neutrophil count of about 1000, suggesting SBP, for which Cipro was started. In the meantime, it was also sent for cytology, with a recent CT showing evidence of omental thickening, raising the question of possible malignancy. I just spoke with the pathologist and it appears, on preliminary review, that immunohistochemical evaluation of the ascitic fluid is suggesting malignant cells, probably of GI tract origin.  The patient was in the hospital and seen by our service for consultation about 3 months ago, when she had a mild drop in hemoglobin and evidence of a low-grade GI bleed. I saw her for similar problems about 3 years ago at which time endoscopy was essentially negative.  The patient does not have any history of liver disease, and her CT scan does not show evidence of a nodular liver or portal hypertension. Her platelet count is normal.  She does have a history of atrial fibrillation, is on chronic anticoagulation, and has some aortic stenosis. A repeat echocardiogram is pending but a year ago, her ejection fraction was normal at 55%.  Therefore, there is not a clear etiology for her ascites, but it is becoming increasingly probable that it is malignant ascites. The patient still has all her organs, specifically, has never had a hysterectomy or removal of her ovaries, gallbladder, appendix, etc.  Clinically, the patient's chief complaint is recurrent vomiting. It's been several weeks since she has really been able to eat normally. She has not exercised for a month. She had to give up extensive walking within the past 6 months or so. She hasn't felt  right for a number of months. However, she is only really been subacutely ill for the past several weeks, as noted.  She has had significant ankle swelling as well as the nausea and vomiting, which were the chief complaints that prompted [place] to direct her to the emergency room yesterday when her daughter called to report those symptoms.   Past Medical History  Diagnosis Date  . Hypertension   . Hyperlipidemia   . COPD (chronic obstructive pulmonary disease)   . Osteoporosis   . H/O hiatal hernia   . Chronic back pain   . CAD (coronary artery disease)     LHC 9/05:  pLAD less than 20%, D1 20-30%, ostial RCA 40-50%, proximal RCA 20%, EF 60%.  . C. difficile diarrhea   . Gout   . Atrial fibrillation     a. failed DCCV => b. amiodarone added => converted to NSR on Amiodarone;  c. Xarelto d/c'd 2/2 falling (wrist and hip Fx in 1/14)  . Carotid stenosis     s/p bilat CEA  . C. difficile colitis     10/2011;  11/2011  . Salmonella enteritis 08/2011    c/b septic shock  . Hx of echocardiogram     a. Echocardiogram 09/29/11: Mild LVH, EF 45-50%, diffuse HK, mild to moderate aortic stenosis, mean gradient 12 mmHg, moderate MAC, mild MR, moderate LAE, moderate RAE, question small secundum ASD with left to right flow not seen in 4 chamber view, PASP 35-39 (mild pulmonary hypertension). ;  b.  TEE on 10/04/11: Mild LVH, EF 55%, mild MR, no defect or  PFO  . Pneumonia 11/2101  . PONV (postoperative nausea and vomiting)   . CHF (congestive heart failure)   . Chronic kidney disease   . GERD (gastroesophageal reflux disease)   . Hepatitis   . Anemia   . Hx of echocardiogram     Echo (09/2013): EF 55%, normal wall motion, mild aortic stenosis (mean 12 mm Hg), MAC, mild MR, mild LAE, mild PI, PASP 30 mm Hg  . Hx of cardiovascular stress test     Lexiscan Myoview (7/15):  No ischemia or infarct, EF 50%, Low Risk  . Arthritis     Neck  . PAF (paroxysmal atrial fibrillation)     Past Surgical History   Procedure Laterality Date  . Carotid endarterectomy      bilateral  . Cataract extraction, bilateral    . Joint replacement  2008    Right Total Knee  . Anal fistula repair    . Left parotidectomy    . Eye surgery    . Flexible sigmoidoscopy  09/03/2011    Procedure: FLEXIBLE SIGMOIDOSCOPY;  Surgeon: Beryle Beams, MD;  Location: [facility];  Service: Endoscopy;  Laterality: N/A;  . Tee without cardioversion  10/04/2011    Procedure: TRANSESOPHAGEAL ECHOCARDIOGRAM (TEE);  Surgeon: Larey Dresser, MD;  Location: [place];  Service: Cardiovascular;  Laterality: N/A;  to be carelinked here by 1230-verified 7/29/dl  . Cardioversion  10/04/2011    Procedure: CARDIOVERSION;  Surgeon: Larey Dresser, MD;  Location: [facility] ENDOSCOPY;  Service: Cardiovascular;  Laterality: N/A;  . Esophagogastroduodenoscopy  11/29/2011    Procedure: ESOPHAGOGASTRODUODENOSCOPY (EGD);  Surgeon: Cleotis Nipper, MD;  Location: [facility];  Service: Endoscopy;  Laterality: N/A;  recent h/o resp failure/pneumonia  . Left hip fracture repair  03/14/12  . Open reduction internal fixation (orif) scaphoid with distal radius graft  03/14/2012    Procedure: OPEN REDUCTION INTERNAL FIXATION (ORIF) SCAPHOID WITH DISTAL RADIUS GRAFT;  Surgeon: Jolyn Nap, MD;  Location: [facility];  Service: Orthopedics;  Laterality: Left;  DVR wrist fracture set/ hand innovation  . Hip pinning,cannulated  03/14/2012    Procedure: CANNULATED HIP PINNING;  Surgeon: Jolyn Nap, MD;  Location: [facility];  Service: Orthopedics;  Laterality: Left;  Biomet 6.5 cannulated screws  . Femur im nail Left 04/13/2012    Procedure: INTRAMEDULLARY (IM) NAIL FEMORAL;  Surgeon: Jolyn Nap, MD;  Location: [facility];  Service: Orthopedics;  Laterality: Left;  OPEN REDUCTION INTERNAL FIXATION LEFT PROXIMAL FEMUR Fracture     Prior to Admission medications   Medication Sig Start Date End Date Taking? Authorizing Provider  allopurinol (ZYLOPRIM) 100 MG  tablet Take 100 mg by mouth every morning.    Yes Historical Provider, MD  amiodarone (PACERONE) 200 MG tablet Take 1 tablet (200 mg total) by mouth daily. 04/07/14  Yes Erlene Quan, PA-C  apixaban (ELIQUIS) 2.5 MG TABS tablet Take 1 tablet (2.5 mg total) by mouth 2 (two) times daily. 04/14/14  Yes Burnell Blanks, MD  atorvastatin (LIPITOR) 40 MG tablet Take 40 mg by mouth at bedtime.   Yes Historical Provider, MD  budesonide-formoterol (SYMBICORT) 160-4.5 MCG/ACT inhaler Inhale 2 puffs into the lungs 2 (two) times daily.   Yes Historical Provider, MD  cholecalciferol (VITAMIN D) 1000 UNITS tablet Take 1,000-2,000 Units by mouth 2 (two) times daily. Take 2000 units in the morning and 1000 units in the evening   Yes Historical Provider, MD  Ciprofloxacin (CIPRO PO) Take 1 tablet by  mouth 2 (two) times daily.   Yes Historical Provider, MD  diltiazem (TIAZAC) 240 MG 24 hr capsule Take 240 mg by mouth daily.   Yes Historical Provider, MD  Docusate Calcium (STOOL SOFTENER PO) Take 1 tablet by mouth as needed (constipation).   Yes Historical Provider, MD  ferrous sulfate 325 (65 FE) MG tablet Take 325 mg by mouth daily.    Yes Historical Provider, MD  furosemide (LASIX) 20 MG tablet Take 1 tablet (20 mg total) by mouth daily. Patient taking differently: Take 20-40 mg by mouth daily as needed for fluid or edema.  07/25/14  Yes Burnell Blanks, MD  levalbuterol [facility] HFA) 45 MCG/ACT inhaler Inhale 2 puffs into the lungs every 6 (six) hours as needed for wheezing.    Yes Historical Provider, MD  levalbuterol Penne Lash) 0.63 MG/3ML nebulizer solution Take 0.63 mg by nebulization every 4 (four) hours as needed for wheezing or shortness of breath.   Yes Historical Provider, MD  montelukast (SINGULAIR) 10 MG tablet Take 10 mg by mouth at bedtime.   Yes Historical Provider, MD  omeprazole (PRILOSEC) 40 MG capsule Take 40 mg by mouth every evening.   Yes Historical Provider, MD  polyethylene glycol  (MIRALAX / GLYCOLAX) packet Take 17 g by mouth daily as needed for mild constipation.   Yes Historical Provider, MD  raloxifene (EVISTA) 60 MG tablet Take 60 mg by mouth every morning.    Yes Historical Provider, MD  Saccharomyces boulardii (FLORASTOR PO) Take 1 tablet by mouth 2 (two) times daily.   Yes Historical Provider, MD  meclizine (ANTIVERT) 25 MG tablet Take 1 tablet (25 mg total) by mouth 2 (two) times daily as needed for dizziness. Patient not taking: Reported on 09/12/2014 07/06/13   Merryl Hacker, MD    Current Facility-Administered Medications  Medication Dose Route Frequency Provider Last Rate Last Dose  . 0.9 %  sodium chloride infusion  250 mL Intravenous PRN Allyne Gee, MD      . albuterol (PROVENTIL) (2.5 MG/3ML) 0.083% nebulizer solution 2.5 mg  2.5 mg Nebulization Q6H PRN Allyne Gee, MD      . allopurinol (ZYLOPRIM) tablet 100 mg  100 mg Oral q morning - 10a Allyne Gee, MD      . amiodarone (PACERONE) tablet 200 mg  200 mg Oral Daily Allyne Gee, MD      . atorvastatin (LIPITOR) tablet 40 mg  40 mg Oral QHS Allyne Gee, MD   40 mg at 09/22/14 2133  . bisacodyl (DULCOLAX) suppository 10 mg  10 mg Rectal Daily PRN Allyne Gee, MD      . budesonide-formoterol (SYMBICORT) 160-4.5 MCG/ACT inhaler 2 puff  2 puff Inhalation BID Allyne Gee, MD   2 puff at 09/23/14 0843  . cefTRIAXone (ROCEPHIN) 2 g in dextrose 5 % 50 mL IVPB - Premix  2 g Intravenous Q24H Allyne Gee, MD   2 g at 09/22/14 2134  . cholecalciferol (VITAMIN D) tablet 1,000 Units  1,000 Units Oral QHS Allyne Gee, MD   1,000 Units at 09/22/14 2154  . cholecalciferol (VITAMIN D) tablet 2,000 Units  2,000 Units Oral Daily Allyne Gee, MD      . diltiazem [facility]) 24 hr capsule 240 mg  240 mg Oral Daily Allyne Gee, MD      . docusate sodium (COLACE) capsule 100 mg  100 mg Oral BID Allyne Gee, MD   100 mg at  09/22/14 2155  . folic acid (FOLVITE) tablet 1 mg  1 mg Oral Daily Allyne Gee, MD    1 mg at 09/22/14 2134  . heparin injection 5,000 Units  5,000 Units Subcutaneous 3 times per day Allyne Gee, MD   5,000 Units at 09/22/14 2135  . levalbuterol (XOPENEX) nebulizer solution 0.63 mg  0.63 mg Nebulization Q6H PRN Allyne Gee, MD      . montelukast (SINGULAIR) tablet 10 mg  10 mg Oral QHS Allyne Gee, MD   10 mg at 09/22/14 2134  . multivitamin with minerals tablet 1 tablet  1 tablet Oral Daily Allyne Gee, MD   1 tablet at 09/22/14 2134  . ondansetron (ZOFRAN) tablet 4 mg  4 mg Oral Q6H PRN Allyne Gee, MD       Or  . ondansetron [facility]) injection 4 mg  4 mg Intravenous Q6H PRN Allyne Gee, MD   4 mg at 09/23/14 0738  . oxyCODONE (Oxy IR/ROXICODONE) immediate release tablet 5 mg  5 mg Oral Q4H PRN Allyne Gee, MD      . pantoprazole (PROTONIX) EC tablet 80 mg  80 mg Oral QPM Allyne Gee, MD   80 mg at 09/22/14 2140  . polyethylene glycol (MIRALAX / GLYCOLAX) packet 17 g  17 g Oral Daily PRN Allyne Gee, MD      . raloxifene (EVISTA) tablet 60 mg  60 mg Oral q morning - 10a Saadat A Humphrey Rolls, MD      . saccharomyces boulardii (FLORASTOR) capsule 250 mg  250 mg Oral BID Allyne Gee, MD   250 mg at 09/22/14 2134  . sodium chloride 0.9 % injection 3 mL  3 mL Intravenous Q12H Allyne Gee, MD   3 mL at 09/22/14 2148  . sodium chloride 0.9 % injection 3 mL  3 mL Intravenous PRN Allyne Gee, MD      . thiamine (VITAMIN B-1) tablet 100 mg  100 mg Oral Daily Allyne Gee, MD   100 mg at 09/22/14 2133    Allergies as of 09/22/2014 - Review Complete 09/22/2014  Allergen Reaction Noted  . Baby powder [methylbenzethonium] Shortness Of Breath 11/02/2011  . Demerol [meperidine] Hives 08/30/2011  . Fexofenadine Other (See Comments) 09/12/2014  . Penicillins Hives 08/30/2011  . Spiriva handihaler [tiotropium bromide monohydrate]  09/12/2013    Family History  Problem Relation Age of Onset  . Hemochromatosis Sister   . Diabetes Sister   . Hypertension Sister   .  Diabetes type II Mother   . Diabetes Mother   . Hypertension Mother   . Deep vein thrombosis Mother   . Hypertension Father   . Hypertension Brother   . Other Brother     Leg amputation-Gun shot  . Depression Brother   . Hemochromatosis Sister   . Heart attack Neg Hx   . Stroke Neg Hx   . Cancer Sister     History   Social History  . Marital Status: Widowed    Spouse Name: N/A  . Number of Children: N/A  . Years of Education: N/A   Occupational History  . Not on file.   Social History Main Topics  . Smoking status: Former Smoker -- 1.00 packs/day for 20 years    Types: Cigarettes    Quit date: 09/01/1983  . Smokeless tobacco: Never Used  . Alcohol Use: No  . Drug Use: No  . Sexual Activity: No  Other Topics Concern  . Not on file   Social History Narrative    Review of Systems: Negative for abdominal pain, dizziness, hematuria, hematochezia (stools have been dark, on iron) bruising, skin rashes, orthopnea or shortness of breath, chest pain. Positive for nausea and vomiting as noted. Also positive for fatigue, although she has been getting up and dressed every day.  Physical Exam: Vital signs in last 24 hours: Temp:  [98.2 F (36.8 C)-98.5 F (36.9 C)] 98.2 F (36.8 C) (07/19 0452) Pulse Rate:  [40-103] 45 (07/19 0455) Resp:  [18-27] 22 (07/19 0452) BP: (89-128)/(45-72) 89/50 mmHg (07/19 0501) SpO2:  [88 %-97 %] 97 % (07/19 0455) Weight:  [72.576 kg (160 lb)] 72.576 kg (160 lb) (07/19 0452) Last BM Date: 09/22/14 General:   Alert,  Well-developed, well-nourished, pleasant and cooperative in NAD Head:  Normocephalic and atraumatic. Eyes:  Sclera clear, no icterus.   Conjunctiva pink. Neck:   No masses or thyromegaly. Lungs:  Clear throughout to auscultation.   No wheezes, crackles, or rhonchi. No evident respiratory distress. Heart:  Irregular rhythm; no murmurs, clicks, rubs,  or gallops. Abdomen:  Moderately distended with ascites, equivalent to probably  7 months of pregnancy. The ascites is not tense, I don't feel any periumbilical adenopathy, no hepatomegaly, no obvious masses. No significant tenderness present. Msk:   Symmetrical without gross deformities. Pulses:  Normal radial pulse is noted. Extremities: Interestingly, now that she is lying in bed, there is no significant pedal edema or tibial edema despite that been a significant complaint as of yesterday. Neurologic:  Alert and coherent;  grossly normal neurologically. Skin:  Intact without significant lesions or rashes. Cervical Nodes:  No significant cervical adenopathy. Psych:  Alert and cooperative. Normal mood and affect.  Intake/Output from previous day:   Intake/Output this shift:    Lab Results:  Recent Labs  09/22/14 1654 09/22/14 2115 09/23/14 0441  WBC 9.9 8.7 7.7  HGB 11.2* 10.8* 10.6*  HCT 34.7* 33.8* 33.1*  PLT 274 266 281   BMET  Recent Labs  09/22/14 1654 09/22/14 2115 09/23/14 0441  NA 136  --  138  K 3.8  --  3.5  CL 100*  --  101  CO2 27  --  28  GLUCOSE 95  --  99  BUN 30*  --  30*  CREATININE 2.04* 1.97* 1.93*  CALCIUM 8.4*  --  8.1*   LFT  Recent Labs  09/23/14 0441  PROT 5.4*  ALBUMIN 2.3*  AST 20  ALT 14  ALKPHOS 70  BILITOT 0.4   PT/INR  Recent Labs  09/23/14 0441  LABPROT 16.9*  INR 1.36    Studies/Results: No results found.  Impression: Ascites, most likely of malignant origin. Final report on cytology is pending.  Plan: 1. Check tumor antigens 2. Await final cytology results 3. Stop iron so as to minimize nausea 4. Consider updated CT scan. Her nausea and vomiting could be due to an element of bowel obstruction 5. I agree with the repeat paracentesis planned for today, so that we can monitor the response to the cell count antibiotic therapy. I agree with IV Rocephin in the meantime   LOS: 1 day   Cutler V  09/23/2014, 10:06 AM   Pager [phone] If no answer or after 5 PM call  [phone]

## 2014-09-23 NOTE — Progress Notes (Signed)
  Echocardiogram 2D Echocardiogram has been performed.  [name] [name] M 09/23/2014, 8:47 AM

## 2014-09-23 NOTE — Progress Notes (Addendum)
[facility] Progress Note   [name] BWG:[id number] DOB: 1923-11-28 DOA: 09/22/2014 PCP: [name]  Brief narrative: [name] [name] is a 81 y.o. female CAD, HTN, HLD, COPD, carotid artery disease s/p bilateral CEA, atrial fibrillation and chronic diastolic CHF, ascites (unknown cause). She was referred to [facility] for nausea and vomiting occurring intermittently. She was recently found to have SBP after paracentesis as outpt on 7/140- started on Cipro- has been vomiting. Her daughter has been calling the GI office as pateint has had edematous legs. She has had frequent nausea and vomiting for weeks.    She was admitted on 4/16 with GI bleed- EGD showed gastritis     Subjective: Vomiting when I was in the room this AM. Was not having abdominal pain. She tells me vomiting is not related to eating and is quite random. Despite taking more Lasix at home, she is not making much urine.   Assessment/Plan: Principal Problem:   SBP (spontaneous bacterial peritonitis) - paracentesis on 7/14 as outpt - recently started on Cipro- Transitioned to Rocephin on admission due to vomiting- have consulted [facility]  Ascites  - cause undetermined -Dr Cristina Gong spoke with pathologist today in regards to ascites- fluid may have malignant cells- awaiting final path - ECHO also shows EF of 35 % today which is new-doubt ascites is from CHF -  As outpt she was being treated for suspected diastolic CHF and was on 20 of Lasix a day- recently she has been taking more Lasix  but not making much urine- was given 40 IV this AM- did not make much urine again- has CKD 4 so would need a much higher dose of Lasix to diurese -   I and O not ordered so am not sure of her output-  orthostatic vitals were positive this AM- still positive by heart rate but not symptomatic- hold off on Lasix-    Vomiting -  likely due to SBP or underlying malignancy - cont NPO-  No IVF as mild Pulm edema on CXR - hold Iron  tabs due to nausea  Systolic CHF- diastolic CHF?- mod AS - EF found to be 30-35% today- last ECHO last yr- EF was 50 % and had mild pulm HTN - she does not have significant right heart failure on ECHO -  Has mild pulm edema on CXR    Atrial fibrillation - admitting doc holding Xarelto for another paracentesis - Dr Cristina Gong agrees with checking to see if SBP is resolving - BP quite low- diltiazem held this AM- will d/c - cont Amiodarone - resume Xarelto if no more procedures planned -This patients CHA2DS2-VASc Score at least 5 - as paracentesis has been done already, start Heparin until sure that she will not need more procedures    CKD (chronic kidney disease), stage 3- 4 - follow  GI bleed in April - due to gastritis - ASA held- Xarelto continued -cont PPI  COPD - cont Symbicort, Xopenex, Singulair  Appt with PCP: requested Code Status: full code Family Communication:  Disposition Plan: f/u on ascitic fluid path DVT prophylaxis: Heparin Consultants:GI Procedures:  Antibiotics: Anti-infectives    Start     Dose/Rate Route Frequency Ordered Stop   09/22/14 2200  cefTRIAXone (ROCEPHIN) 2 g in dextrose 5 % 50 mL IVPB - Premix     2 g 100 mL/hr over 30 Minutes Intravenous Every 24 hours 09/22/14 2036        Objective: Filed Weights   09/22/14 2015 09/23/14 0452  Weight: 72.576 kg (160 lb) 72.576 kg (160 lb)   No intake or output data in the 24 hours ending 09/23/14 1321   Vitals Filed Vitals:   09/23/14 1021 09/23/14 1029 09/23/14 1036 09/23/14 1110  BP: 95/66 88/46 101/47 92/55  Pulse:      Temp:      TempSrc:      Resp:      Height:      Weight:      SpO2:        Exam:  General:  Pt is alert, not in acute distress  HEENT: No icterus, No thrush, oral mucosa moist  Cardiovascular: regular rate and rhythm, S1/S2 No murmur  Respiratory: clear to auscultation bilaterally   Abdomen: Soft, +Bowel sounds, non tender, non distended, no guarding  MSK: No  LE edema, cyanosis or clubbing  Data Reviewed: Basic Metabolic Panel:  Recent Labs Lab 09/22/14 1654 09/22/14 2115 09/23/14 0441  NA 136  --  138  K 3.8  --  3.5  CL 100*  --  101  CO2 27  --  28  GLUCOSE 95  --  99  BUN 30*  --  30*  CREATININE 2.04* 1.97* 1.93*  CALCIUM 8.4*  --  8.1*  MG  --   --  2.0  PHOS  --   --  3.6   Liver Function Tests:  Recent Labs Lab 09/22/14 1654 09/23/14 0441  AST 24 20  ALT 17 14  ALKPHOS 64 70  BILITOT 0.5 0.4  PROT 5.9* 5.4*  ALBUMIN 2.5* 2.3*    Recent Labs Lab 09/22/14 1654  LIPASE 29   No results for input(s): AMMONIA in the last 168 hours. CBC:  Recent Labs Lab 09/22/14 1654 09/22/14 2115 09/23/14 0441  WBC 9.9 8.7 7.7  NEUTROABS 8.6*  --   --   HGB 11.2* 10.8* 10.6*  HCT 34.7* 33.8* 33.1*  MCV 89.2 89.7 89.5  PLT 274 266 281   Cardiac Enzymes: No results for input(s): CKTOTAL, CKMB, CKMBINDEX, TROPONINI in the last 168 hours. BNP (last 3 results)  Recent Labs  02/26/14 1550 09/12/14 1448 09/22/14 1701  BNP 292.3* 360.2* 165.9*    ProBNP (last 3 results) No results for input(s): PROBNP in the last 8760 hours.  CBG:  Recent Labs Lab 09/23/14 0817  GLUCAP 101*    Recent Results (from the past 240 hour(s))  Culture, body fluid-bottle     Status: None (Preliminary result)   Collection Time: 09/18/14 12:33 PM  Result Value Ref Range Status   Specimen Description FLUID ABDOMEN PERITONEAL  Final   Special Requests BAA 5MLS  Final   Culture NO GROWTH 4 DAYS  Final   Report Status PENDING  Incomplete  Gram stain     Status: None   Collection Time: 09/18/14 12:33 PM  Result Value Ref Range Status   Specimen Description FLUID ABDOMEN PERITONEAL  Final   Special Requests NONE  Final   Gram Stain   Final    MODERATE WBC PRESENT, PREDOMINANTLY PMN NO ORGANISMS SEEN    Report Status 09/19/2014 FINAL  Final     Studies: No results found.  Scheduled Meds:  Scheduled Meds: . allopurinol  100  mg Oral q morning - 10a  . amiodarone  200 mg Oral Daily  . atorvastatin  40 mg Oral QHS  . budesonide-formoterol  2 puff Inhalation BID  . cefTRIAXone (ROCEPHIN)  IV  2 g Intravenous Q24H  . cholecalciferol  1,000  Units Oral QHS  . cholecalciferol  2,000 Units Oral Daily  . diltiazem  240 mg Oral Daily  . docusate sodium  100 mg Oral BID  . folic acid  1 mg Oral Daily  . heparin  5,000 Units Subcutaneous 3 times per day  . montelukast  10 mg Oral QHS  . multivitamin with minerals  1 tablet Oral Daily  . pantoprazole  80 mg Oral QPM  . raloxifene  60 mg Oral q morning - 10a  . saccharomyces boulardii  250 mg Oral BID  . sodium chloride  3 mL Intravenous Q12H  . thiamine  100 mg Oral Daily   Continuous Infusions:   Time spent on care of this patient: 25 min   [place], MD 09/23/2014, 1:21 PM  LOS: 1 day   [facility] Office  [phone] Pager - Text Page per [email or web address] If 7PM-7AM, please contact night-coverage [email or web address]

## 2014-09-23 NOTE — Progress Notes (Signed)
Dr Wynelle Cleveland called with orhtostatic bp results as ordered

## 2014-09-23 NOTE — Progress Notes (Signed)
ANTICOAGULATION CONSULT NOTE - Initial Consult  Pharmacy Consult for IV heparin Indication: Atrial fibrillation  Allergies  Allergen Reactions  . Baby Powder [Methylbenzethonium] Shortness Of Breath  . Demerol [Meperidine] Hives  . Fexofenadine Other (See Comments)    dizziness  . Penicillins Hives  . Spiriva Handihaler [Tiotropium Bromide Monohydrate]     Caused chest pains    Patient Measurements: Height: 5\' 1"  (154.9 cm) Weight: 160 lb (72.576 kg) IBW/kg (Calculated) : 47.8 Heparin Dosing Weight:   Vital Signs: Temp: 98.3 F (36.8 C) (07/19 1321) Temp Source: Oral (07/19 1321) BP: 86/52 mmHg (07/19 1352) Pulse Rate: 86 (07/19 1321)  Labs:  Recent Labs  09/22/14 1654 09/22/14 2115 09/23/14 0441  HGB 11.2* 10.8* 10.6*  HCT 34.7* 33.8* 33.1*  PLT 274 266 281  APTT  --   --  36  LABPROT  --   --  16.9*  INR  --   --  1.36  CREATININE 2.04* 1.97* 1.93*    Estimated Creatinine Clearance: 18.4 mL/min (by C-G formula based on Cr of 1.93).   Medical History: Past Medical History  Diagnosis Date  . Hypertension   . Hyperlipidemia   . COPD (chronic obstructive pulmonary disease)   . Osteoporosis   . H/O hiatal hernia   . Chronic back pain   . CAD (coronary artery disease)     LHC 9/05:  pLAD less than 20%, D1 20-30%, ostial RCA 40-50%, proximal RCA 20%, EF 60%.  . C. difficile diarrhea   . Gout   . Atrial fibrillation     a. failed DCCV => b. amiodarone added => converted to NSR on Amiodarone;  c. Xarelto d/c'd 2/2 falling (wrist and hip Fx in 1/14)  . Carotid stenosis     s/p bilat CEA  . C. difficile colitis     10/2011;  11/2011  . Salmonella enteritis 08/2011    c/b septic shock  . Hx of echocardiogram     a. Echocardiogram 09/29/11: Mild LVH, EF 45-50%, diffuse HK, mild to moderate aortic stenosis, mean gradient 12 mmHg, moderate MAC, mild MR, moderate LAE, moderate RAE, question small secundum ASD with left to right flow not seen in 4 chamber view, PASP  35-39 (mild pulmonary hypertension). ;  b.  TEE on 10/04/11: Mild LVH, EF 55%, mild MR, no defect or PFO  . Pneumonia 11/2101  . PONV (postoperative nausea and vomiting)   . CHF (congestive heart failure)   . Chronic kidney disease   . GERD (gastroesophageal reflux disease)   . Hepatitis   . Anemia   . Hx of echocardiogram     Echo (09/2013): EF 55%, normal wall motion, mild aortic stenosis (mean 12 mm Hg), MAC, mild MR, mild LAE, mild PI, PASP 30 mm Hg  . Hx of cardiovascular stress test     Lexiscan Myoview (7/15):  No ischemia or infarct, EF 50%, Low Risk  . Arthritis     Neck  . PAF (paroxysmal atrial fibrillation)     Assessment: 89 yoF on apixaban PTA for hx PAF, CAD s/p bilateral CEA, chronic diastolic CHF (EF 81-19%), and ascites admitted on 7/18 for SBP and vomiting.  Anticoagulation on hold for paracentesis.  Pharmacy consulted to start IV heparin post-procedure for more possible procedures.    Poor renal function: CrCl ~18 ml/min Wt = 72.6kg.  Heparin wt = 64kg Last dose apixaban 2.5mg  on 7/18 @ 0930 7/19 INR 1.36 / PT 16.9 CBC: Hgb 10.6, relatively stable, plts  WNL  Goal of Therapy:  Heparin level 0.3-0.7 units/ml Monitor platelets by anticoagulation protocol: Yes   Plan:  No bolus.  Start IV heparin 900 units/hr.  F/u 8 hour level.  Baseline aPTT.   Ralene Bathe, PharmD, BCPS 09/23/2014, 6:35 PM  Pager: [phone]

## 2014-09-23 NOTE — Progress Notes (Signed)
Initial Nutrition Assessment  DOCUMENTATION CODES:   Non-severe (moderate) malnutrition in context of chronic illness, Obesity unspecified  INTERVENTION:   Diet advancement per MD When diet advanced, recommend Ensure Enlive po BID, each supplement provides 350 kcal and 20 grams of protein RD to continue to monitor  NUTRITION DIAGNOSIS:   Malnutrition related to chronic illness as evidenced by mild depletion of body fat, mild depletion of muscle mass.  GOAL:   Patient will meet greater than or equal to 90% of their needs  MONITOR:   Diet advancement, Labs, Weight trends, Skin, I & O's  REASON FOR ASSESSMENT:   Malnutrition Screening Tool    ASSESSMENT:   81 y.o. female with history of CKD HTN COPD ascites Atrial Fibrillation on Eliquis presents with increased abdominal distension. Patient was seen last week in the office and was noted to have ascites.   Pt reports feeling much better after procedure.  S/p US guided paracentesis. Yield: 3.1L of fluid.  Pt reports she would like to eat but diet has not been upgraded yet. Pt would like to try Ensure supplements. Pt states she was unable to eat much 1.5 weeks PTA. Pt was vomiting PTA as well.  Per weight history, pt has lost 11 lb. Weight fluctuates d/t fluid. Nutrition-Focused physical exam completed. Findings are mild fat depletion, mild muscle depletion, and mild edema.   Labs reviewed: Elevated BUN, Creatinine Mg/Phos WNL  Diet Order:  Diet NPO time specified  Skin:  Reviewed, no issues  Last BM:  7/18  Height:   Ht Readings from Last 1 Encounters:  09/22/14 5\' 1"  (1.549 m)    Weight:   Wt Readings from Last 1 Encounters:  09/23/14 160 lb (72.576 kg)    Ideal Body Weight:  47.7 kg  Wt Readings from Last 10 Encounters:  09/23/14 160 lb (72.576 kg)  06/24/14 171 lb 15.3 oz (78 kg)  06/06/14 175 lb (79.379 kg)  04/28/14 172 lb (78.019 kg)  04/14/14 180 lb (81.647 kg)  03/31/14 176 lb (79.833 kg)   03/04/14 178 lb (80.74 kg)  02/28/14 180 lb (81.647 kg)  11/15/13 173 lb (78.472 kg)  09/26/13 172 lb (78.019 kg)    BMI:  Body mass index is 30.25 kg/(m^2).  Estimated Nutritional Needs:   Kcal:  1300-1500  Protein:  70-80g  Fluid:  1.5L/day  EDUCATION NEEDS:   No education needs identified at this time  Clayton Bibles, MS, RD, LDN Pager: [phone] After Hours Pager: [phone]

## 2014-09-23 NOTE — Procedures (Signed)
Successful US guided paracentesis from RLQ.  Yielded 3.1 liters of serous fluid.  No immediate complications.  Pt tolerated well.   Specimen was sent for labs.  Tsosie Billing D PA-C 09/23/2014 11:09 AM

## 2014-09-23 NOTE — Progress Notes (Signed)
Pt back from Radiology/Paracentesis. Bandaid to right lower abd clean dry and intact. No complaints at this time. Denies N/V/Pain. VS checked during this time also.

## 2014-09-24 DIAGNOSIS — N183 Chronic kidney disease, stage 3 (moderate): Secondary | ICD-10-CM

## 2014-09-24 DIAGNOSIS — I481 Persistent atrial fibrillation: Secondary | ICD-10-CM

## 2014-09-24 DIAGNOSIS — I509 Heart failure, unspecified: Secondary | ICD-10-CM

## 2014-09-24 DIAGNOSIS — R188 Other ascites: Secondary | ICD-10-CM | POA: Insufficient documentation

## 2014-09-24 DIAGNOSIS — I5043 Acute on chronic combined systolic (congestive) and diastolic (congestive) heart failure: Secondary | ICD-10-CM

## 2014-09-24 LAB — CBC
HCT: 36.1 % (ref 36.0–46.0)
Hemoglobin: 11.1 g/dL — ABNORMAL LOW (ref 12.0–15.0)
MCH: 27.8 pg (ref 26.0–34.0)
MCHC: 30.7 g/dL (ref 30.0–36.0)
MCV: 90.5 fL (ref 78.0–100.0)
Platelets: 268 10*3/uL (ref 150–400)
RBC: 3.99 MIL/uL (ref 3.87–5.11)
RDW: 15.2 % (ref 11.5–15.5)
WBC: 8.9 10*3/uL (ref 4.0–10.5)

## 2014-09-24 LAB — GLUCOSE, CAPILLARY: GLUCOSE-CAPILLARY: 78 mg/dL (ref 65–99)

## 2014-09-24 LAB — HEPARIN LEVEL (UNFRACTIONATED)
Heparin Unfractionated: 1.68 IU/mL — ABNORMAL HIGH (ref 0.30–0.70)
Heparin Unfractionated: 1.36 IU/mL — ABNORMAL HIGH (ref 0.30–0.70)

## 2014-09-24 LAB — HEMOGLOBIN A1C
Hgb A1c MFr Bld: 6.1 % — ABNORMAL HIGH (ref 4.8–5.6)
MEAN PLASMA GLUCOSE: 128 mg/dL

## 2014-09-24 LAB — APTT
aPTT: 54 seconds — ABNORMAL HIGH (ref 24–37)
aPTT: 51 seconds — ABNORMAL HIGH (ref 24–37)

## 2014-09-24 LAB — FOLATE RBC
FOLATE, HEMOLYSATE: 445.6 ng/mL
Folate, RBC: 1277 ng/mL (ref >498)
Hematocrit: 34.9 % (ref 34.0–46.6)

## 2014-09-24 LAB — CERULOPLASMIN: Ceruloplasmin: 31.2 mg/dL (ref 19.0–39.0)

## 2014-09-24 LAB — RHEUMATOID FACTOR: RHEUMATOID FACTOR: 15.1 [IU]/mL — AB (ref 0.0–13.9)

## 2014-09-24 LAB — ANTINUCLEAR ANTIBODIES, IFA: ANA Ab, IFA: NEGATIVE

## 2014-09-24 MED ORDER — HEPARIN (PORCINE) IN NACL 100-0.45 UNIT/ML-% IJ SOLN
650.0000 [IU]/h | INTRAMUSCULAR | Status: DC
  Filled 2014-09-24: qty 250

## 2014-09-24 MED ORDER — HEPARIN (PORCINE) IN NACL 100-0.45 UNIT/ML-% IJ SOLN
1100.0000 [IU]/h | INTRAMUSCULAR | Status: DC
  Administered 2014-09-24 – 2014-09-26 (×2): 1100 [IU]/h via INTRAVENOUS
  Filled 2014-09-24 (×5): qty 250

## 2014-09-24 MED ORDER — METOCLOPRAMIDE HCL 5 MG/ML IJ SOLN
5.0000 mg | Freq: Four times a day (QID) | INTRAMUSCULAR | Status: DC | PRN
  Administered 2014-09-24 – 2014-09-27 (×3): 5 mg via INTRAVENOUS
  Filled 2014-09-24 (×3): qty 2

## 2014-09-24 MED ORDER — BISOPROLOL FUMARATE 5 MG PO TABS
5.0000 mg | ORAL_TABLET | Freq: Every day | ORAL | Status: DC
  Administered 2014-09-24 – 2014-09-26 (×2): 5 mg via ORAL
  Filled 2014-09-24 (×3): qty 1

## 2014-09-24 MED ORDER — HALOPERIDOL LACTATE 5 MG/ML IJ SOLN: 2.0000 mg | Freq: Once | INTRAMUSCULAR | Status: DC

## 2014-09-24 MED ORDER — ENSURE ENLIVE PO LIQD
237.0000 mL | Freq: Two times a day (BID) | ORAL | Status: DC
  Administered 2014-09-24 – 2014-10-02 (×11): 237 mL via ORAL

## 2014-09-24 MED ORDER — VANCOMYCIN HCL IN DEXTROSE 1-5 GM/200ML-% IV SOLN
1000.0000 mg | INTRAVENOUS | Status: DC
  Administered 2014-09-24: 1000 mg via INTRAVENOUS
  Filled 2014-09-24 (×2): qty 200

## 2014-09-24 MED ORDER — DEXTROSE 5 % IV SOLN
1.0000 g | INTRAVENOUS | Status: DC
  Administered 2014-09-24 – 2014-09-26 (×3): 1 g via INTRAVENOUS
  Filled 2014-09-24 (×3): qty 1

## 2014-09-24 MED ORDER — TRAMADOL HCL 50 MG PO TABS
100.0000 mg | ORAL_TABLET | Freq: Four times a day (QID) | ORAL | Status: DC | PRN
  Administered 2014-09-24 – 2014-10-01 (×2): 100 mg via ORAL
  Filled 2014-09-24 (×3): qty 2

## 2014-09-24 NOTE — Progress Notes (Signed)
CARDIOLOGY CONSULT NOTE     Patient ID: [name] [name] MRN: [id number] DOB/AGE: [birth year] 81 y.o.  Admit date: 09/22/2014 Referring Physician Nat Math MD Primary Physician Carlos Levering, PA-C Primary Cardiologist Darlina Guys MD Reason for Consultation CHF  HPI: Pleasant 81 yo WF seen at the request of the primary care service for evaluation of CHF. She has a history of chronic Atrial fibrillation. This was first noted in June 2012 and she was anticoagulated and placed on amiodarone. Follow up in August 2013 and January 2014 showed she was in NSR. In December 2015 she was admitted with Afib with RVR. There is a note that beta blocker was tried but caused wheezing. When seen April 2016 she was in afib.  She also has a history of nonobstructive CAD, carotid arterial disease s/p bilateral CEA, and chronic diastolic CHF. Prior Echos in 2013 show EF ranging from 45-55%. She presented with chest pain in 2013 and had a normal Myoview at that time.  More recently she has complained of increased abdominal swelling and lower extremity edema. Complains of nausea and spitting up. Found to have significant ascites and underwent paracentesis suspicious for SBP. She was treated with antibiotics. Ascites returned and she is readmitted. She does note some dyspnea recently. No chest pain. No palpitations. Echo this admission shows new worsening LV function. Her biggest complaint now is of pain in left upper abdomen.  Past Medical History  Diagnosis Date  . Hypertension   . Hyperlipidemia   . COPD (chronic obstructive pulmonary disease)   . Osteoporosis   . H/O hiatal hernia   . Chronic back pain   . CAD (coronary artery disease)     LHC 9/05:  pLAD less than 20%, D1 20-30%, ostial RCA 40-50%, proximal RCA 20%, EF 60%.  . C. difficile diarrhea   . Gout   . Atrial fibrillation     a. failed DCCV => b. amiodarone added => converted to NSR on Amiodarone;  c. Xarelto d/c'd 2/2 falling (wrist and hip  Fx in 1/14)  . Carotid stenosis     s/p bilat CEA  . C. difficile colitis     10/2011;  11/2011  . Salmonella enteritis 08/2011    c/b septic shock  . Hx of echocardiogram     a. Echocardiogram 09/29/11: Mild LVH, EF 45-50%, diffuse HK, mild to moderate aortic stenosis, mean gradient 12 mmHg, moderate MAC, mild MR, moderate LAE, moderate RAE, question small secundum ASD with left to right flow not seen in 4 chamber view, PASP 35-39 (mild pulmonary hypertension). ;  b.  TEE on 10/04/11: Mild LVH, EF 55%, mild MR, no defect or PFO  . Pneumonia 11/2101  . PONV (postoperative nausea and vomiting)   . CHF (congestive heart failure)   . Chronic kidney disease   . GERD (gastroesophageal reflux disease)   . Hepatitis   . Anemia   . Hx of echocardiogram     Echo (09/2013): EF 55%, normal wall motion, mild aortic stenosis (mean 12 mm Hg), MAC, mild MR, mild LAE, mild PI, PASP 30 mm Hg  . Hx of cardiovascular stress test     Lexiscan Myoview (7/15):  No ischemia or infarct, EF 50%, Low Risk  . Arthritis     Neck  . PAF (paroxysmal atrial fibrillation)     Family History  Problem Relation Age of Onset  . Hemochromatosis Sister   . Diabetes Sister   . Hypertension Sister   . Diabetes type II Mother   .  Diabetes Mother   . Hypertension Mother   . Deep vein thrombosis Mother   . Hypertension Father   . Hypertension Brother   . Other Brother     Leg amputation-[place]  . Depression Brother   . Hemochromatosis Sister   . Heart attack Neg Hx   . Stroke Neg Hx   . Cancer Sister     History   Social History  . Marital Status: Widowed    Spouse Name: N/A  . Number of Children: N/A  . Years of Education: N/A   Occupational History  . Not on file.   Social History Main Topics  . Smoking status: Former Smoker -- 1.00 packs/day for 20 years    Types: Cigarettes    Quit date: 09/01/1983  . Smokeless tobacco: Never Used  . Alcohol Use: No  . Drug Use: No  . Sexual Activity: No   Other  Topics Concern  . Not on file   Social History Narrative    Past Surgical History  Procedure Laterality Date  . Carotid endarterectomy      bilateral  . Cataract extraction, bilateral    . Joint replacement  2008    Right Total Knee  . Anal fistula repair    . Left parotidectomy    . Eye surgery    . Flexible sigmoidoscopy  09/03/2011    Procedure: FLEXIBLE SIGMOIDOSCOPY;  Surgeon: Beryle Beams, MD;  Location: [facility];  Service: Endoscopy;  Laterality: N/A;  . Tee without cardioversion  10/04/2011    Procedure: TRANSESOPHAGEAL ECHOCARDIOGRAM (TEE);  Surgeon: Larey Dresser, MD;  Location: [facility];  Service: Cardiovascular;  Laterality: N/A;  to be carelinked here by 1230-verified 7/29/dl  . Cardioversion  10/04/2011    Procedure: CARDIOVERSION;  Surgeon: Larey Dresser, MD;  Location: [facility];  Service: Cardiovascular;  Laterality: N/A;  . Esophagogastroduodenoscopy  11/29/2011    Procedure: ESOPHAGOGASTRODUODENOSCOPY (EGD);  Surgeon: Cleotis Nipper, MD;  Location: [facility];  Service: Endoscopy;  Laterality: N/A;  recent h/o resp failure/pneumonia  . Left hip fracture repair  03/14/12  . Open reduction internal fixation (orif) scaphoid with distal radius graft  03/14/2012    Procedure: OPEN REDUCTION INTERNAL FIXATION (ORIF) SCAPHOID WITH DISTAL RADIUS GRAFT;  Surgeon: Jolyn Nap, MD;  Location: [facility];  Service: Orthopedics;  Laterality: Left;  DVR wrist fracture set/ hand innovation  . Hip pinning,cannulated  03/14/2012    Procedure: CANNULATED HIP PINNING;  Surgeon: Jolyn Nap, MD;  Location: [facility];  Service: Orthopedics;  Laterality: Left;  Biomet 6.5 cannulated screws  . Femur im nail Left 04/13/2012    Procedure: INTRAMEDULLARY (IM) NAIL FEMORAL;  Surgeon: Jolyn Nap, MD;  Location: [facility];  Service: Orthopedics;  Laterality: Left;  OPEN REDUCTION INTERNAL FIXATION LEFT PROXIMAL FEMUR Fracture      Prescriptions prior to admission  Medication  Sig Dispense Refill Last Dose  . allopurinol (ZYLOPRIM) 100 MG tablet Take 100 mg by mouth every morning.    09/22/2014 at Unknown time  . amiodarone (PACERONE) 200 MG tablet Take 1 tablet (200 mg total) by mouth daily. 90 tablet 4 09/22/2014 at Unknown time  . apixaban (ELIQUIS) 2.5 MG TABS tablet Take 1 tablet (2.5 mg total) by mouth 2 (two) times daily. 180 tablet 3 09/22/2014 at 0930  . atorvastatin (LIPITOR) 40 MG tablet Take 40 mg by mouth at bedtime.   09/22/2014 at Unknown time  . budesonide-formoterol (SYMBICORT) 160-4.5 MCG/ACT inhaler Inhale  2 puffs into the lungs 2 (two) times daily.   09/22/2014 at Unknown time  . cholecalciferol (VITAMIN D) 1000 UNITS tablet Take 1,000-2,000 Units by mouth 2 (two) times daily. Take 2000 units in the morning and 1000 units in the evening   09/22/2014 at Unknown time  . Ciprofloxacin (CIPRO PO) Take 1 tablet by mouth 2 (two) times daily.   Past Week at Unknown time  . diltiazem (TIAZAC) 240 MG 24 hr capsule Take 240 mg by mouth daily.   09/22/2014 at Unknown time  . Docusate Calcium (STOOL SOFTENER PO) Take 1 tablet by mouth as needed (constipation).   Past Month at Unknown time  . ferrous sulfate 325 (65 FE) MG tablet Take 325 mg by mouth daily.    09/22/2014 at Unknown time  . furosemide (LASIX) 20 MG tablet Take 1 tablet (20 mg total) by mouth daily. (Patient taking differently: Take 20-40 mg by mouth daily as needed for fluid or edema. ) 30 tablet 11 09/22/2014 at Unknown time  . levalbuterol (XOPENEX HFA) 45 MCG/ACT inhaler Inhale 2 puffs into the lungs every 6 (six) hours as needed for wheezing.    Past Month at Unknown time  . levalbuterol (XOPENEX) 0.63 MG/3ML nebulizer solution Take 0.63 mg by nebulization every 4 (four) hours as needed for wheezing or shortness of breath.   Past Month at Unknown time  . montelukast (SINGULAIR) 10 MG tablet Take 10 mg by mouth at bedtime.   09/21/2014 at Unknown time  . omeprazole (PRILOSEC) 40 MG capsule Take 40 mg by  mouth every evening.   09/21/2014 at Unknown time  . polyethylene glycol (MIRALAX / GLYCOLAX) packet Take 17 g by mouth daily as needed for mild constipation.   Past Month at Unknown time  . raloxifene (EVISTA) 60 MG tablet Take 60 mg by mouth every morning.    09/22/2014 at Unknown time  . Saccharomyces boulardii (FLORASTOR PO) Take 1 tablet by mouth 2 (two) times daily.   09/22/2014 at Unknown time  . meclizine (ANTIVERT) 25 MG tablet Take 1 tablet (25 mg total) by mouth 2 (two) times daily as needed for dizziness. (Patient not taking: Reported on 09/12/2014) 30 tablet 0 Not Taking at Unknown time     ROS: As noted in HPI. All other systems are reviewed and are negative unless otherwise mentioned.   Physical Exam: Blood pressure 103/59, pulse 90, temperature 98.1 F (36.7 C), temperature source Oral, resp. rate 17, height 5\' 1"  (1.549 m), weight 69.627 kg (153 lb 8 oz), SpO2 89 %. Current Weight  09/24/14 69.627 kg (153 lb 8 oz)  06/24/14 78 kg (171 lb 15.3 oz)  06/06/14 79.379 kg (175 lb)    GENERAL:  Ill appearing [name] HEENT:  PERRL, EOMI, sclera are clear. Oropharynx is clear. NECK:  No jugular venous distention, carotid upstroke brisk and symmetric, no bruits, no thyromegaly or adenopathy LUNGS:  Bibasilar rales CHEST:  Unremarkable HEART:  IRRR,  PMI not displaced or sustained,S1 and S2 within normal limits, no S3, no S4: 2/6 systolic murmur RUSB. No rub. ABD:  Soft, distended, nontender. BS +, no masses or bruits. No hepatomegaly, no splenomegaly EXT:  2 + pulses throughout, no edema, no cyanosis no clubbing SKIN:  Warm and dry.  No rashes NEURO:  Alert and oriented x 3. Cranial nerves II through XII intact. PSYCH:  Cognitively intact    Labs:   Lab Results  Component Value Date   WBC 7.7 09/23/2014  HGB 10.6* 09/23/2014   HCT 33.1* 09/23/2014   MCV 89.5 09/23/2014   PLT 281 09/23/2014    Recent Labs Lab 09/23/14 0441  NA 138  K 3.5  CL 101  CO2 28  BUN 30*    CREATININE 1.93*  CALCIUM 8.1*  PROT 5.4*  BILITOT 0.4  ALKPHOS 70  ALT 14  AST 20  GLUCOSE 99   Lab Results  Component Value Date   CKTOTAL 32 09/29/2011   CKTOTAL 33 09/29/2011   CKTOTAL 42 09/29/2011   CKMB 2.4 09/29/2011   CKMB 2.5 09/29/2011   CKMB 2.4 09/29/2011   TROPONINI 0.03 09/12/2014   TROPONINI <0.03 06/22/2014   TROPONINI <0.03 06/22/2014   Lab Results  Component Value Date   CHOL 120 09/22/2014   CHOL 176 03/14/2012   Lab Results  Component Value Date   HDL 53 09/22/2014   HDL 76 03/14/2012   Lab Results  Component Value Date   LDLCALC 48 09/22/2014   LDLCALC 81 03/14/2012   Lab Results  Component Value Date   TRIG 94 09/22/2014   TRIG 94 03/14/2012   Lab Results  Component Value Date   CHOLHDL 2.3 09/22/2014   CHOLHDL 2.3 03/14/2012   No results found for: LDLDIRECT  Lab Results  Component Value Date   PROBNP 84.0 09/12/2013   PROBNP 5095.0* 11/28/2011   PROBNP 1563.0* 10/01/2011   Lab Results  Component Value Date   TSH 2.218 09/22/2014   Lab Results  Component Value Date   HGBA1C 6.1* 09/22/2014    Radiology: US Paracentesis  09/23/2014   INDICATION: Recurrent ascites, request for diagnostic and therapeutic paracentesis.  EXAM: ULTRASOUND-GUIDED PARACENTESIS  COMPARISON:  Paracentesis 09/18/14.  MEDICATIONS: None.  COMPLICATIONS: None immediate  TECHNIQUE: Informed written consent was obtained from the patient after a discussion of the risks, benefits and alternatives to treatment. A timeout was performed prior to the initiation of the procedure.  Initial ultrasound scanning demonstrates a moderate amount of ascites within the right lower abdominal quadrant. The right lower abdomen was prepped and draped in the usual sterile fashion. 1% lidocaine was used for local anesthesia.  Under direct ultrasound guidance, a 19 gauge, 10-cm, Yueh catheter was introduced. An ultrasound image was saved for documentation purposed. The paracentesis  was performed. The catheter was removed and a dressing was applied. The patient tolerated the procedure well without immediate post procedural complication.  FINDINGS: A total of approximately 3.1 liters of serous fluid was removed. Samples were sent to the laboratory as requested by the clinical team.  IMPRESSION: Successful ultrasound-guided paracentesis yielding 3.1 liters of peritoneal fluid.  Read By:  Tsosie Billing PA-C   Electronically Signed   By: Corrie Mckusick D.O.   On: 09/23/2014 15:33   Dg Chest Port 1 View  09/23/2014   CLINICAL DATA:  Shortness of breath.  Chest congestion.  EXAM: PORTABLE CHEST - 1 VIEW  COMPARISON:  Chest x-rays dated 06/21/2014, 02/26/2014, 07/24/2013 and 07/06/2013 and 04/12/2012  FINDINGS: There is cardiomegaly with new pulmonary vascular congestion and very slight accentuation of the interstitial markings. No discrete effusions. No acute osseous abnormality.  IMPRESSION: Pulmonary vascular congestion with minimal interstitial edema.   Electronically Signed   By: Lorriane Shire M.D.   On: 09/23/2014 14:30   Dg Abd Portable 1v  09/23/2014   CLINICAL DATA:  Abdominal distention and pain.  Ascites.  EXAM: PORTABLE ABDOMEN - 1 VIEW  COMPARISON:  CT scan dated 09/12/2014  FINDINGS: Bowel gas  pattern is normal. Residual contrast in the appendix from the recent CT scan. Diffuse degenerative changes and scoliosis in the lumbar spine. Chronic degenerative or posttraumatic changes of the right pubic body. No acute osseous abnormality. Healed left hip fracture.  IMPRESSION: No acute abnormality of the abdomen.   Electronically Signed   By: Lorriane Shire M.D.   On: 09/23/2014 14:43    EKG: Atrial fibrillation with occ. PVC. Nonspecific ST-T wave abnormality. I have personally reviewed and interpreted this study.  Echo:Study Conclusions  - Left ventricle: The cavity size was mildly dilated. Wall thickness was normal. Systolic function was moderately to severely reduced. The  estimated ejection fraction was in the range of 30% to 35%. Diffuse hypokinesis. Doppler parameters are consistent with high ventricular filling pressure. - Aortic valve: Valve mobility was restricted. There was moderate stenosis. Valve area (VTI): 1.36 cm^2. Valve area (Vmax): 1.31 cm^2. Valve area (Vmean): 1.26 cm^2. - Mitral valve: Calcified annulus. There was mild regurgitation. - Left atrium: The atrium was severely dilated. - Right ventricle: Systolic function was mildly reduced. - Pulmonary arteries: Systolic pressure was mildly increased. - Pericardium, extracardiac: A trivial pericardial effusion was identified. There was a left pleural effusion.  Impressions:  - Moderate to severe global reduction in LV function; mild LVE; severe LAE; mildly reduced RV function; calcified aortic valve with reduced cusp excursion; moderate AS by continuity equation; mild MR and TR; mildly elevated pulmonary pressure. Compared to 09/26/13, LV function is worse and aortic stenosis may be mildly worse. I have personally reviewed and interpreted this study.   ASSESSMENT AND PLAN:  1. Acute on chronic systolic CHF. EF is clearly worse by Echo. She has some pulmonary congestion. Limited response to diuretic but need to be careful with too much diuresis with recent large volume paracentesis. I don't think her ascites can be completely explained by right heart failure. Right heart function looks pretty good by Echo and pulmonary pressures are only mildly increased. No LE edema now and JV pulse looks normal. She is not a candidate for ACEi/ARB due to CKD. We will switch diltiazem to a beta blocker given low EF. Will use bisoprolol 5 mg daily to avoid bronchospasm. It is interesting to note significant weight loss prior to admission. Prior weights were in the 175-180 lb range and now 160. Plan to resume anticoagulation with Eliquis once invasive procedures no longer needed. For now on IV  heparin. 2. Ascites. Etiology unclear cytology demonstrates atypical cells suspicious for malignancy. This may explain her weight loss and persistent nausea.  3. Atrial fibrillation. Persistent. I don't see the benefit to amiodarone at this point since she appears to be in persistent Afib. I would favor rate control only with beta blocker. Amiodarone also has the potential for liver toxicity and for this reason should be stopped. Would also hold lipitor for similar reasons.  4. CKD stage 3-4 5. Mild to moderate aortic stenosis.  6. S/p bilateral CEA 7. HTN 8. COPD.  9. S/p GI bleed in April due to gastric ulcers.   Signed: Peter Martin[place], [place]  09/24/2014, 4:45 PM

## 2014-09-24 NOTE — Progress Notes (Signed)
ANTICOAGULATION CONSULT NOTE - Follow Up Consult  Pharmacy Consult for IV heparin Indication: Atrial fibrillation  Allergies  Allergen Reactions  . Baby Powder [Methylbenzethonium] Shortness Of Breath  . Demerol [Meperidine] Hives  . Fexofenadine Other (See Comments)    dizziness  . Penicillins Hives  . Spiriva Handihaler [Tiotropium Bromide Monohydrate]     Caused chest pains    Patient Measurements: Height: 5\' 1"  (154.9 cm) Weight: 153 lb 8 oz (69.627 kg) IBW/kg (Calculated) : 47.8 Heparin Dosing Weight: 63 kg  Vital Signs: Temp: 98.1 F (36.7 C) (07/20 0638) Temp Source: Oral (07/20 8676) BP: 103/59 mmHg (07/20 0840) Pulse Rate: 90 (07/20 0840)  Labs:  Recent Labs  09/22/14 1654 09/22/14 2115 09/23/14 0441 09/23/14 1930 09/24/14 0419  HGB 11.2* 10.8* 10.6*  --   --   HCT 34.7* 33.8* 33.1*  --   --   PLT 274 266 281  --   --   APTT  --   --  36 34 54*  LABPROT  --   --  16.9*  --   --   INR  --   --  1.36  --   --   HEPARINUNFRC  --   --   --   --  1.68*  CREATININE 2.04* 1.97* 1.93*  --   --     Estimated Creatinine Clearance: 18 mL/min (by C-G formula based on Cr of 1.93).   Assessment: 22 yoF on apixaban PTA for hx PAF, CAD s/p bilateral CEA, chronic diastolic CHF (EF 19-50%), and ascites admitted on 7/18 for SBP and vomiting.  Anticoagulation on hold for paracentesis.  Pharmacy consulted to start IV heparin post-procedure for more possible procedures.    Poor renal function: CrCl ~18 ml/min Last dose apixaban 2.5mg  on 7/18 @ 0930 CBC: Hgb 10.6, relatively stable, plts WNL  Initial heparin level on heparin @ 900 units/hr = 1.68.  Heparin infusion rate was reduced following this result; however, patient recently took apixaban (last dose 7/18).  Ordered aPTT to be run with AM heparin level blood draw and this resulted as 54 seconds (subtherapeutic); therefore, the elevated heparin level is a false elevation caused by recent apixaban use.  Will order aPTTs  with heparin levels until the levels correlate and the effects of the apixaban have diminished.  Given CrCl~18 ml/min, the apixaban will take longer to eliminate.  Goal of Therapy:  APTT 66-102 seconds  Heparin level 0.3-0.7 units/ml Monitor platelets by anticoagulation protocol: Yes   Plan:  1.  Increase heparin infusion to 1000 units/hr. 2.  Check heparin level and aPTT in 8 hours. 3.  F/u plan/timeline for transitioning back to oral anticoagulation. 4.  Daily CBC while on heparin infusion.  Hershal Coria, PharmD, BCPS Pager: [phone] 09/24/2014 9:44 AM

## 2014-09-24 NOTE — Progress Notes (Signed)
ANTICOAGULATION CONSULT NOTE - Follow Up Consult  Pharmacy Consult for IV heparin Indication: Atrial fibrillation  Allergies  Allergen Reactions  . Baby Powder [Methylbenzethonium] Shortness Of Breath  . Demerol [Meperidine] Hives  . Fexofenadine Other (See Comments)    dizziness  . Penicillins Hives  . Spiriva Handihaler [Tiotropium Bromide Monohydrate]     Caused chest pains    Patient Measurements: Height: 5\' 1"  (154.9 cm) Weight: 160 lb (72.576 kg) IBW/kg (Calculated) : 47.8 Heparin Dosing Weight: 63 kg  Vital Signs: Temp: 97.9 F (36.6 C) (07/19 2106) Temp Source: Oral (07/19 2106) BP: 91/55 mmHg (07/19 2106) Pulse Rate: 91 (07/19 2106)  Labs:  Recent Labs  09/22/14 1654 09/22/14 2115 09/23/14 0441 09/23/14 1930 09/24/14 0419  HGB 11.2* 10.8* 10.6*  --   --   HCT 34.7* 33.8* 33.1*  --   --   PLT 274 266 281  --   --   APTT  --   --  36 34  --   LABPROT  --   --  16.9*  --   --   INR  --   --  1.36  --   --   HEPARINUNFRC  --   --   --   --  1.68*  CREATININE 2.04* 1.97* 1.93*  --   --     Estimated Creatinine Clearance: 18.4 mL/min (by C-G formula based on Cr of 1.93).   Medical History: Past Medical History  Diagnosis Date  . Hypertension   . Hyperlipidemia   . COPD (chronic obstructive pulmonary disease)   . Osteoporosis   . H/O hiatal hernia   . Chronic back pain   . CAD (coronary artery disease)     LHC 9/05:  pLAD less than 20%, D1 20-30%, ostial RCA 40-50%, proximal RCA 20%, EF 60%.  . C. difficile diarrhea   . Gout   . Atrial fibrillation     a. failed DCCV => b. amiodarone added => converted to NSR on Amiodarone;  c. Xarelto d/c'd 2/2 falling (wrist and hip Fx in 1/14)  . Carotid stenosis     s/p bilat CEA  . C. difficile colitis     10/2011;  11/2011  . Salmonella enteritis 08/2011    c/b septic shock  . Hx of echocardiogram     a. Echocardiogram 09/29/11: Mild LVH, EF 45-50%, diffuse HK, mild to moderate aortic stenosis, mean  gradient 12 mmHg, moderate MAC, mild MR, moderate LAE, moderate RAE, question small secundum ASD with left to right flow not seen in 4 chamber view, PASP 35-39 (mild pulmonary hypertension). ;  b.  TEE on 10/04/11: Mild LVH, EF 55%, mild MR, no defect or PFO  . Pneumonia 11/2101  . PONV (postoperative nausea and vomiting)   . CHF (congestive heart failure)   . Chronic kidney disease   . GERD (gastroesophageal reflux disease)   . Hepatitis   . Anemia   . Hx of echocardiogram     Echo (09/2013): EF 55%, normal wall motion, mild aortic stenosis (mean 12 mm Hg), MAC, mild MR, mild LAE, mild PI, PASP 30 mm Hg  . Hx of cardiovascular stress test     Lexiscan Myoview (7/15):  No ischemia or infarct, EF 50%, Low Risk  . Arthritis     Neck  . PAF (paroxysmal atrial fibrillation)     Assessment: 89 yoF on apixaban PTA for hx PAF, CAD s/p bilateral CEA, chronic diastolic CHF (EF 97-67%), and ascites admitted  on 7/18 for SBP and vomiting.  Anticoagulation on hold for paracentesis.  Pharmacy consulted to start IV heparin post-procedure for more possible procedures.    Poor renal function: CrCl ~18 ml/min Wt = 72.6kg.  Heparin wt = 64kg Last dose apixaban 2.5mg  on 7/18 @ 0930 7/19 INR 1.36 / PT 16.9 CBC: Hgb 10.6, relatively stable, plts WNL   Initial heparin level on heparin @ 900 units/hr = 3.66  No complications of therapy noted  Goal of Therapy:  Heparin level 0.3-0.7 units/ml Monitor platelets by anticoagulation protocol: Yes   Plan:   Hold heparin x 1 hr  Restart heparin at reduced rate of 650 units/hr  Check heparin level 8 hr after heparin rate reduced; will also check today's CBC at this time  Check CBC daily while on heparin  Leone Haven, PharmD 09/24/2014, 6:13 AM

## 2014-09-24 NOTE — Progress Notes (Signed)
ANTIBIOTIC CONSULT NOTE - INITIAL  Pharmacy Consult for ceftazidime and vanomycin Indication:HCAP  Allergies  Allergen Reactions  . Baby Powder [Methylbenzethonium] Shortness Of Breath  . Demerol [Meperidine] Hives  . Fexofenadine Other (See Comments)    dizziness  . Penicillins Hives    Tolerates Ceftriaxone.  Marland Kitchen Spiriva Handihaler [Tiotropium Bromide Monohydrate]     Caused chest pains    Patient Measurements: Height: 5\' 1"  (154.9 cm) Weight: 153 lb 8 oz (69.627 kg) IBW/kg (Calculated) : 47.8   Vital Signs: Temp: 98.1 F (36.7 C) (07/20 1962) Temp Source: Oral (07/20 2297) BP: 103/59 mmHg (07/20 0840) Pulse Rate: 90 (07/20 0840) Intake/Output from previous day: 07/19 0701 - 07/20 0700 In: 244 [P.O.:60; I.V.:84; IV Piggyback:100] Out: -  Intake/Output from this shift: Total I/O In: 360 [P.O.:360] Out: -   Labs:  Recent Labs  09/22/14 1654 09/22/14 2115 09/23/14 0441  WBC 9.9 8.7 7.7  HGB 11.2* 10.8* 10.6*  PLT 274 266 281  CREATININE 2.04* 1.97* 1.93*   Estimated Creatinine Clearance: 18 mL/min (by C-G formula based on Cr of 1.93). No results for input(s): VANCOTROUGH, VANCOPEAK, VANCORANDOM, GENTTROUGH, GENTPEAK, GENTRANDOM, TOBRATROUGH, TOBRAPEAK, TOBRARND, AMIKACINPEAK, AMIKACINTROU, AMIKACIN in the last 72 hours.   Microbiology: Recent Results (from the past 720 hour(s))  Urine culture     Status: None   Collection Time: 09/12/14  2:56 PM  Result Value Ref Range Status   Specimen Description URINE, CLEAN CATCH  Final   Special Requests NONE  Final   Culture   Final    >=100,000 COLONIES/mL ESCHERICHIA COLI Performed at [facility]    Report Status 09/15/2014 FINAL  Final   Organism ID, Bacteria ESCHERICHIA COLI  Final      Susceptibility   Escherichia coli - MIC*    AMPICILLIN <=2 SENSITIVE Sensitive     CEFAZOLIN <=4 SENSITIVE Sensitive     CEFTRIAXONE <=1 SENSITIVE Sensitive     CIPROFLOXACIN >=4 RESISTANT Resistant     GENTAMICIN  <=1 SENSITIVE Sensitive     IMIPENEM <=0.25 SENSITIVE Sensitive     NITROFURANTOIN <=16 SENSITIVE Sensitive     TRIMETH/SULFA <=20 SENSITIVE Sensitive     AMPICILLIN/SULBACTAM <=2 SENSITIVE Sensitive     PIP/TAZO <=4 SENSITIVE Sensitive     * >=100,000 COLONIES/mL ESCHERICHIA COLI  Culture, body fluid-bottle     Status: None   Collection Time: 09/18/14 12:33 PM  Result Value Ref Range Status   Specimen Description FLUID ABDOMEN PERITONEAL  Final   Special Requests BAA 5MLS  Final   Culture NO GROWTH 5 DAYS  Final   Report Status 09/23/2014 FINAL  Final  Gram stain     Status: None   Collection Time: 09/18/14 12:33 PM  Result Value Ref Range Status   Specimen Description FLUID ABDOMEN PERITONEAL  Final   Special Requests NONE  Final   Gram Stain   Final    MODERATE WBC PRESENT, PREDOMINANTLY PMN NO ORGANISMS SEEN    Report Status 09/19/2014 FINAL  Final  Gram stain     Status: None   Collection Time: 09/23/14 10:39 AM  Result Value Ref Range Status   Specimen Description FLUID ABDOMEN PERITONEAL  Final   Special Requests NONE  Final   Gram Stain   Final    ABUNDANT WBC PRESENT,BOTH PMN AND MONONUCLEAR NO ORGANISMS SEEN    Report Status 09/23/2014 FINAL  Final    Medical History: Past Medical History  Diagnosis Date  . Hypertension   .  Hyperlipidemia   . COPD (chronic obstructive pulmonary disease)   . Osteoporosis   . H/O hiatal hernia   . Chronic back pain   . CAD (coronary artery disease)     LHC 9/05:  pLAD less than 20%, D1 20-30%, ostial RCA 40-50%, proximal RCA 20%, EF 60%.  . C. difficile diarrhea   . Gout   . Atrial fibrillation     a. failed DCCV => b. amiodarone added => converted to NSR on Amiodarone;  c. Xarelto d/c'd 2/2 falling (wrist and hip Fx in 1/14)  . Carotid stenosis     s/p bilat CEA  . C. difficile colitis     10/2011;  11/2011  . Salmonella enteritis 08/2011    c/b septic shock  . Hx of echocardiogram     a. Echocardiogram 09/29/11: Mild  LVH, EF 45-50%, diffuse HK, mild to moderate aortic stenosis, mean gradient 12 mmHg, moderate MAC, mild MR, moderate LAE, moderate RAE, question small secundum ASD with left to right flow not seen in 4 chamber view, PASP 35-39 (mild pulmonary hypertension). ;  b.  TEE on 10/04/11: Mild LVH, EF 55%, mild MR, no defect or PFO  . Pneumonia 11/2101  . PONV (postoperative nausea and vomiting)   . CHF (congestive heart failure)   . Chronic kidney disease   . GERD (gastroesophageal reflux disease)   . Hepatitis   . Anemia   . Hx of echocardiogram     Echo (09/2013): EF 55%, normal wall motion, mild aortic stenosis (mean 12 mm Hg), MAC, mild MR, mild LAE, mild PI, PASP 30 mm Hg  . Hx of cardiovascular stress test     Lexiscan Myoview (7/15):  No ischemia or infarct, EF 50%, Low Risk  . Arthritis     Neck  . PAF (paroxysmal atrial fibrillation)     Medications:  Scheduled:  . allopurinol  100 mg Oral q morning - 10a  . amiodarone  200 mg Oral Daily  . atorvastatin  40 mg Oral QHS  . budesonide-formoterol  2 puff Inhalation BID  . cholecalciferol  1,000 Units Oral QHS  . cholecalciferol  2,000 Units Oral Daily  . docusate sodium  100 mg Oral BID  . feeding supplement (ENSURE ENLIVE)  237 mL Oral BID BM  . folic acid  1 mg Oral Daily  . montelukast  10 mg Oral QHS  . multivitamin with minerals  1 tablet Oral Daily  . pantoprazole  80 mg Oral QPM  . raloxifene  60 mg Oral q morning - 10a  . saccharomyces boulardii  250 mg Oral BID  . sodium chloride  3 mL Intravenous Q12H  . thiamine  100 mg Oral Daily   Assessment: [name] [name] is a 81 y.o. female CAD, HTN, HLD, COPD, carotid artery disease s/p bilateral CEA, atrial fibrillation and chronic diastolic CHF, ascites (unknown cause). She was referred to [facility] for nausea and vomiting occurring intermittently. She was recently found to have SBP after paracentesis as outpt on 7/14. Pt. Has had 2 doses of ceftriaxone, now pharmacy consulted to dose  vancomycin and ceftazidime for HCAP.  Goal of Therapy:  Vancomycin trough level 15-20 mcg/ml Ceftazidime and vanc  per renal function  Plan:   Ceftazidime 1gm IV q24h  Vancomycin 1gm IV q48h  Follow renal function, cultures, clinical course  vanc trough as needed  Dolly Rias RPh 09/24/2014, 2:46 PM Pager [phone]

## 2014-09-24 NOTE — Progress Notes (Addendum)
[facility] PROGRESS NOTE  [name] [name] NTZ:[id number] DOB: Oct 09, 1923 DOA: 09/22/2014 PCP: [name]  Brief narrative: I have seen and examined [name] [name] at bedside and reviewed her chart including off service note by Dr. Wynelle Cleveland. [facility]. [name] [name] is a pleasant 81 y.o. female with CAD, HTN, HLD, COPD, carotid artery disease s/p bilateral CEA, atrial fibrillation and chronic diastolic CHF, ascites (unknown cause), who presented with increased abdominal distension, now status post paracentesis twice with concern for malignant ascites, final cytology results pending. She is on treatment for presumed SBP although fluid cultures have remained negative. Patient also had 2-D echocardiogram which showed a decrease in ejection fraction from 50% in 2015 to 30-35% this time around. She complains of pleuritic chest pain, and it is possible that she has healthcare associated pneumonia given she was hospitalized a couple of months ago. Will therefore change antibiotics to vancomycin/cefepime to cover for nosocomial organisms, consult cardiology regarding low ejection fraction, ?further workup and follow GI recommendations. Otherwise she complains of cough and abdominal distention but leg edema has resolved. Assessment/Plan: Principal Problem:  SBP (spontaneous bacterial peritonitis) - paracentesis on 7/14 as outpt - recently started on Cipro- Transitioned to Rocephin on admission due to vomiting- [facility] on board. -Change antibiotics to Vancomycin/Cefepime  Ascites  - cause undetermined -Dr Cristina Gong spoke with pathologist today in regards to ascites- fluid may have malignant cells- awaiting final path - ECHO also shows EF of 35 % today which is new-doubt ascites is from CHF - As outpt she was being treated for suspected diastolic CHF and was on 20 of Lasix a day- recently she has been taking more Lasix but not making much urine- was given 40 IV this AM- did not make  much urine again- has CKD 4 so would need a much higher dose of Lasix to diurese -Defer to GI for further management   Acute Systolic CHF- diastolic CHF?- mod AS - EF found to be 30-35% today- last ECHO last yr- EF was 50 % and had mild pulm HTN - she does not have significant right heart failure on ECHO - Has mild pulm edema on CXR, possible pneumonia - Consult cardiology   Atrial fibrillation - admitting doc holding Xarelto for another paracentesis - Dr Cristina Gong agrees with checking to see if SBP is resolving - BP quite low- diltiazem held this AM- will d/c - cont Amiodarone - resume Xarelto if no more procedures planned -This patients CHA2DS2-VASc Score at least 5 - as paracentesis has been done already, start Heparin until sure that she will not need more procedures   CKD (chronic kidney disease), stage 3- 4 - follow  GI bleed in April - due to gastritis - ASA held- Xarelto continued -cont PPI  COPD - cont Symbicort, Xopenex, Singulair.  - Change antibiotics to vancomycin/cefepime  Appt with PCP: requested Code Status: full code Family Communication:  Disposition Plan: f/u on ascitic fluid path DVT prophylaxis: Heparin Consultants:GI/cardiology Procedures: Paracentesis Antibiotics:  Vancomycin 09/24/2014  Cefepime 09/24/2014   Objective: Filed Vitals:   09/24/14 0840  BP: 103/59  Pulse: 90  Temp:   Resp: 17    Intake/Output Summary (Last 24 hours) at 09/24/14 1437 Last data filed at 09/24/14 0900  Gross per 24 hour  Intake    604 ml  Output      0 ml  Net    604 ml   Filed Weights   09/22/14 2015 09/23/14 0452 09/24/14 0638  Weight: 72.576 kg (160 lb)  72.576 kg (160 lb) 69.627 kg (153 lb 8 oz)    Exam:   General:  Comfortable at rest.  Cardiovascular: S1-S2 normal. No murmurs. Pulse regular.  Respiratory: Good air entry bilaterally. Bibasilar rhonchi or rales.  Abdomen: Soft distended. Tenderness to deep palpation  diffusely.  Musculoskeletal: No pedal edema   Neurological: Intact  Data Reviewed: Basic Metabolic Panel:  Recent Labs Lab 09/22/14 1654 09/22/14 2115 09/23/14 0441  NA 136  --  138  K 3.8  --  3.5  CL 100*  --  101  CO2 27  --  28  GLUCOSE 95  --  99  BUN 30*  --  30*  CREATININE 2.04* 1.97* 1.93*  CALCIUM 8.4*  --  8.1*  MG  --   --  2.0  PHOS  --   --  3.6   Liver Function Tests:  Recent Labs Lab 09/22/14 1654 09/23/14 0441  AST 24 20  ALT 17 14  ALKPHOS 64 70  BILITOT 0.5 0.4  PROT 5.9* 5.4*  ALBUMIN 2.5* 2.3*    Recent Labs Lab 09/22/14 1654  LIPASE 29   No results for input(s): AMMONIA in the last 168 hours. CBC:  Recent Labs Lab 09/22/14 1654 09/22/14 2115 09/23/14 0441  WBC 9.9 8.7 7.7  NEUTROABS 8.6*  --   --   HGB 11.2* 10.8* 10.6*  HCT 34.7* 33.8* 33.1*  MCV 89.2 89.7 89.5  PLT 274 266 281   Cardiac Enzymes: No results for input(s): CKTOTAL, CKMB, CKMBINDEX, TROPONINI in the last 168 hours. BNP (last 3 results)  Recent Labs  02/26/14 1550 09/12/14 1448 09/22/14 1701  BNP 292.3* 360.2* 165.9*    ProBNP (last 3 results) No results for input(s): PROBNP in the last 8760 hours.  CBG:  Recent Labs Lab 09/23/14 0817 09/24/14 0745  GLUCAP 101* 78    Recent Results (from the past 240 hour(s))  Culture, body fluid-bottle     Status: None   Collection Time: 09/18/14 12:33 PM  Result Value Ref Range Status   Specimen Description FLUID ABDOMEN PERITONEAL  Final   Special Requests BAA 5MLS  Final   Culture NO GROWTH 5 DAYS  Final   Report Status 09/23/2014 FINAL  Final  Gram stain     Status: None   Collection Time: 09/18/14 12:33 PM  Result Value Ref Range Status   Specimen Description FLUID ABDOMEN PERITONEAL  Final   Special Requests NONE  Final   Gram Stain   Final    MODERATE WBC PRESENT, PREDOMINANTLY PMN NO ORGANISMS SEEN    Report Status 09/19/2014 FINAL  Final  Gram stain     Status: None   Collection Time:  09/23/14 10:39 AM  Result Value Ref Range Status   Specimen Description FLUID ABDOMEN PERITONEAL  Final   Special Requests NONE  Final   Gram Stain   Final    ABUNDANT WBC PRESENT,BOTH PMN AND MONONUCLEAR NO ORGANISMS SEEN    Report Status 09/23/2014 FINAL  Final     Studies: US Paracentesis  09/23/2014   INDICATION: Recurrent ascites, request for diagnostic and therapeutic paracentesis.  EXAM: ULTRASOUND-GUIDED PARACENTESIS  COMPARISON:  Paracentesis 09/18/14.  MEDICATIONS: None.  COMPLICATIONS: None immediate  TECHNIQUE: Informed written consent was obtained from the patient after a discussion of the risks, benefits and alternatives to treatment. A timeout was performed prior to the initiation of the procedure.  Initial ultrasound scanning demonstrates a moderate amount of ascites within the right lower abdominal  quadrant. The right lower abdomen was prepped and draped in the usual sterile fashion. 1% lidocaine was used for local anesthesia.  Under direct ultrasound guidance, a 19 gauge, 10-cm, Yueh catheter was introduced. An ultrasound image was saved for documentation purposed. The paracentesis was performed. The catheter was removed and a dressing was applied. The patient tolerated the procedure well without immediate post procedural complication.  FINDINGS: A total of approximately 3.1 liters of serous fluid was removed. Samples were sent to the laboratory as requested by the clinical team.  IMPRESSION: Successful ultrasound-guided paracentesis yielding 3.1 liters of peritoneal fluid.  Read By:  Tsosie Billing PA-C   Electronically Signed   By: Corrie Mckusick D.O.   On: 09/23/2014 15:33   Dg Chest Port 1 View  09/23/2014   CLINICAL DATA:  Shortness of breath.  Chest congestion.  EXAM: PORTABLE CHEST - 1 VIEW  COMPARISON:  Chest x-rays dated 06/21/2014, 02/26/2014, 07/24/2013 and 07/06/2013 and 04/12/2012  FINDINGS: There is cardiomegaly with new pulmonary vascular congestion and very slight  accentuation of the interstitial markings. No discrete effusions. No acute osseous abnormality.  IMPRESSION: Pulmonary vascular congestion with minimal interstitial edema.   Electronically Signed   By: Lorriane Shire M.D.   On: 09/23/2014 14:30   Dg Abd Portable 1v  09/23/2014   CLINICAL DATA:  Abdominal distention and pain.  Ascites.  EXAM: PORTABLE ABDOMEN - 1 VIEW  COMPARISON:  CT scan dated 09/12/2014  FINDINGS: Bowel gas pattern is normal. Residual contrast in the appendix from the recent CT scan. Diffuse degenerative changes and scoliosis in the lumbar spine. Chronic degenerative or posttraumatic changes of the right pubic body. No acute osseous abnormality. Healed left hip fracture.  IMPRESSION: No acute abnormality of the abdomen.   Electronically Signed   By: Lorriane Shire M.D.   On: 09/23/2014 14:43    Scheduled Meds: . allopurinol  100 mg Oral q morning - 10a  . amiodarone  200 mg Oral Daily  . atorvastatin  40 mg Oral QHS  . budesonide-formoterol  2 puff Inhalation BID  . cholecalciferol  1,000 Units Oral QHS  . cholecalciferol  2,000 Units Oral Daily  . docusate sodium  100 mg Oral BID  . feeding supplement (ENSURE ENLIVE)  237 mL Oral BID BM  . folic acid  1 mg Oral Daily  . montelukast  10 mg Oral QHS  . multivitamin with minerals  1 tablet Oral Daily  . pantoprazole  80 mg Oral QPM  . raloxifene  60 mg Oral q morning - 10a  . saccharomyces boulardii  250 mg Oral BID  . sodium chloride  3 mL Intravenous Q12H  . thiamine  100 mg Oral Daily   Continuous Infusions: . heparin 1,000 Units/hr (09/24/14 0949)     Time spent: 25 minutes    Hayley Horn  [facility] Pager [phone]. If 7PM-7AM, please contact night-coverage at [email or web address], password [facility] 09/24/2014, 2:37 PM  LOS: 2 days

## 2014-09-24 NOTE — Significant Event (Addendum)
Order for haldol placed in wrong chart. Will d/c haldol.

## 2014-09-24 NOTE — Care Management Important Message (Signed)
Important Message  Patient Details  Name: Nancyann Cotterman MRN: [id number] Date of Birth: Mar 24, 1923   Medicare Important Message Given:  Yes-second notification given    Camillo Flaming 09/24/2014, 12:20 [place] Message  Patient Details  Name: Amyla Heffner MRN: [id number] Date of Birth: June 18, 1923   Medicare Important Message Given:  Yes-second notification given    Camillo Flaming 09/24/2014, 12:20 PM

## 2014-09-24 NOTE — Progress Notes (Signed)
Patient without nausea or vomiting last night. She is interested in trying to eat small amounts today.  Paracentesis yesterday, 3 L, yielded a cell count similar to that from a week earlier, despite antibiotic therapy in the interim. The culture on her initial paracentesis showed no growth at 5 days, in the Gram stain was negative for organisms. I wonder whether her high neutrophil count is the consequence of peritoneal inflammation, possibly from malignant infiltration of the abdominal cavity.  The final cytology report is still pending. However, per discussion with the pathologist yesterday, it is suspicious for malignancy. I have not yet shared that information with the patient or her daughter, but have indicated that I think we will have the diagnosis prior to discharge, and that we're awaiting cytology results, and that this may not be an infection in her peritoneal fluid, or for that matter, liver disease causing her ascites. In that regard, it is interesting that the patient has a family history of hemachromatosis in 2 sisters.   Plan:  1. Try conservative diet 2. Await final cytology results 3. In the meantime, treat as for SBP 4. Check ferritin level. If extremely elevated, consider checking hemachromatosis genetic studies.  Cleotis Nipper, M.D. Pager [phone] If no answer or after 5 PM call [phone]

## 2014-09-24 NOTE — Progress Notes (Signed)
ANTICOAGULATION CONSULT NOTE - Follow Up Consult  Pharmacy Consult for IV heparin Indication: Atrial fibrillation  Allergies  Allergen Reactions  . Baby Powder [Methylbenzethonium] Shortness Of Breath  . Demerol [Meperidine] Hives  . Fexofenadine Other (See Comments)    dizziness  . Penicillins Hives    Tolerates Ceftriaxone.  Marland Kitchen Spiriva Handihaler [Tiotropium Bromide Monohydrate]     Caused chest pains    Patient Measurements: Height: 5\' 1"  (154.9 cm) Weight: 153 lb 8 oz (69.627 kg) IBW/kg (Calculated) : 47.8 Heparin Dosing Weight: 63 kg  Vital Signs: Temp: 98.2 F (36.8 C) (07/20 1829) BP: 125/69 mmHg (07/20 1829) Pulse Rate: 95 (07/20 1829)  Labs:  Recent Labs  09/22/14 1654 09/22/14 2115  09/23/14 0441 09/23/14 1930 09/24/14 0419 09/24/14 1815  HGB 11.2* 10.8*  --  10.6*  --   --  11.1*  HCT 34.7* 33.8*  34.9  --  33.1*  --   --  36.1  PLT 274 266  --  281  --   --  268  APTT  --   --   < > 36 34 54* 51*  LABPROT  --   --   --  16.9*  --   --   --   INR  --   --   --  1.36  --   --   --   HEPARINUNFRC  --   --   --   --   --  1.68* 1.36*  CREATININE 2.04* 1.97*  --  1.93*  --   --   --   < > = values in this interval not displayed.  Estimated Creatinine Clearance: 18 mL/min (by C-G formula based on Cr of 1.93).   Assessment: 36 yoF on apixaban PTA for hx PAF, CAD s/p bilateral CEA, chronic diastolic CHF (EF 18-34%), and ascites admitted on 7/18 for SBP and vomiting.  Anticoagulation on hold for paracentesis.  Pharmacy consulted to start IV heparin post-procedure for more possible procedures.    Poor renal function: CrCl ~18 ml/min Last dose apixaban 2.5mg  on 7/18 @ 0930 CBC: Hgb 10.6, relatively stable, plts WNL  Initial heparin level on heparin @ 900 units/hr = 1.68.  Heparin infusion rate was reduced following this result; however, patient recently took apixaban (last dose 7/18).  Ordered aPTT to be run with AM heparin level blood draw and this  resulted as 54 seconds (subtherapeutic); therefore, the elevated heparin level is a false elevation caused by recent apixaban use.  Will order aPTTs with heparin levels until the levels correlate and the effects of the apixaban have diminished.  Given CrCl~18 ml/min, the apixaban will take longer to eliminate.  1800: heparin level 1.36, aPTT 51  Goal of Therapy:  APTT 66-102 seconds  Heparin level 0.3-0.7 units/ml Monitor platelets by anticoagulation protocol: Yes   Plan:  1.  Increase heparin infusion to 1100 units/hr. 2.  Check heparin level and aPTT with morning labs 3.  F/u plan/timeline for transitioning back to oral anticoagulation. 4.  Daily CBC while on heparin infusion.  Dolly Rias RPh 09/24/2014, 7:21 PM Pager [phone]

## 2014-09-24 NOTE — Progress Notes (Signed)
PT Cancellation Note  Patient Details Name: [name] [name] MRN: [id number] DOB: 08/08/1923   Cancelled Treatment:    Reason Eval/Treat Not Completed: Pain limiting ability to participate   Claretha Cooper 09/24/2014, 4:59 PM Tresa Endo PT [phone]

## 2014-09-25 DIAGNOSIS — I482 Chronic atrial fibrillation: Secondary | ICD-10-CM

## 2014-09-25 DIAGNOSIS — I5042 Chronic combined systolic (congestive) and diastolic (congestive) heart failure: Secondary | ICD-10-CM

## 2014-09-25 LAB — HEPARIN LEVEL (UNFRACTIONATED)
Heparin Unfractionated: 1.2 IU/mL — ABNORMAL HIGH (ref 0.30–0.70)
Heparin Unfractionated: 1.1 IU/mL — ABNORMAL HIGH (ref 0.30–0.70)

## 2014-09-25 LAB — BASIC METABOLIC PANEL
Anion gap: 9 (ref 5–15)
BUN: 32 mg/dL — ABNORMAL HIGH (ref 6–20)
CO2: 30 mmol/L (ref 22–32)
Calcium: 8.6 mg/dL — ABNORMAL LOW (ref 8.9–10.3)
Chloride: 102 mmol/L (ref 101–111)
Creatinine, Ser: 1.91 mg/dL — ABNORMAL HIGH (ref 0.44–1.00)
GFR, EST AFRICAN AMERICAN: 26 mL/min — AB (ref >60)
GFR, EST NON AFRICAN AMERICAN: 22 mL/min — AB (ref >60)
Glucose, Bld: 87 mg/dL (ref 65–99)
POTASSIUM: 3.9 mmol/L (ref 3.5–5.1)
SODIUM: 141 mmol/L (ref 135–145)

## 2014-09-25 LAB — CBC
HCT: 35.2 % — ABNORMAL LOW (ref 36.0–46.0)
HEMOGLOBIN: 10.9 g/dL — AB (ref 12.0–15.0)
MCH: 27.5 pg (ref 26.0–34.0)
MCHC: 31 g/dL (ref 30.0–36.0)
MCV: 88.9 fL (ref 78.0–100.0)
Platelets: 250 10*3/uL (ref 150–400)
RBC: 3.96 MIL/uL (ref 3.87–5.11)
RDW: 15 % (ref 11.5–15.5)
WBC: 8.3 10*3/uL (ref 4.0–10.5)

## 2014-09-25 LAB — CEA: CEA: 5.5 ng/mL — ABNORMAL HIGH (ref 0.0–4.7)

## 2014-09-25 LAB — CANCER ANTIGEN 19-9: CA 19-9: 8 U/mL (ref 0–35)

## 2014-09-25 LAB — GLUCOSE, CAPILLARY: Glucose-Capillary: 125 mg/dL — ABNORMAL HIGH (ref 65–99)

## 2014-09-25 LAB — APTT
APTT: 85 s — AB (ref 24–37)
aPTT: 75 seconds — ABNORMAL HIGH (ref 24–37)

## 2014-09-25 LAB — CA 125: CA 125: 175.3 U/mL — ABNORMAL HIGH (ref 0.0–38.1)

## 2014-09-25 LAB — FERRITIN: FERRITIN: 69 ng/mL (ref 11–307)

## 2014-09-25 LAB — OCCULT BLOOD X 1 CARD TO LAB, STOOL: Fecal Occult Bld: NEGATIVE

## 2014-09-25 NOTE — Progress Notes (Signed)
Filed Vitals:   09/24/14 2036 09/24/14 2045 09/25/14 0448 09/25/14 0937  BP: 103/54  88/64   Pulse:   75   Temp:   97.9 F (36.6 C)   TempSrc:   Oral   Resp:   16   Height:      Weight:      SpO2:  97% 98% 96%    Intake/Output Summary (Last 24 hours) at 09/25/14 1142 Last data filed at 09/25/14 0700  Gross per 24 hour  Intake 820.27 ml  Output    100 ml  Net 720.27 ml   Filed Weights   09/22/14 2015 09/23/14 0452 09/24/14 0638  Weight: 72.576 kg (160 lb) 72.576 kg (160 lb) 69.627 kg (153 lb 8 oz)    Subjective Denies SOB or chest pain. She is comfortable. Abdomen swollen.  Marland Kitchen allopurinol  100 mg Oral q morning - 10a  . bisoprolol  5 mg Oral Daily  . budesonide-formoterol  2 puff Inhalation BID  . cefTAZidime (FORTAZ)  IV  1 g Intravenous Q24H  . cholecalciferol  1,000 Units Oral QHS  . cholecalciferol  2,000 Units Oral Daily  . docusate sodium  100 mg Oral BID  . feeding supplement (ENSURE ENLIVE)  237 mL Oral BID BM  . folic acid  1 mg Oral Daily  . montelukast  10 mg Oral QHS  . multivitamin with minerals  1 tablet Oral Daily  . pantoprazole  80 mg Oral QPM  . raloxifene  60 mg Oral q morning - 10a  . saccharomyces boulardii  250 mg Oral BID  . sodium chloride  3 mL Intravenous Q12H  . thiamine  100 mg Oral Daily  . vancomycin  1,000 mg Intravenous Q48H   . heparin 1,100 Units/hr (09/24/14 2346)    LABS: Basic Metabolic Panel:  Recent Labs  09/23/14 0441 09/25/14 0350  NA 138 141  K 3.5 3.9  CL 101 102  CO2 28 30  GLUCOSE 99 87  BUN 30* 32*  CREATININE 1.93* 1.91*  CALCIUM 8.1* 8.6*  MG 2.0  --   PHOS 3.6  --    Liver Function Tests:  Recent Labs  09/22/14 1654 09/23/14 0441  AST 24 20  ALT 17 14  ALKPHOS 64 70  BILITOT 0.5 0.4  PROT 5.9* 5.4*  ALBUMIN 2.5* 2.3*    Recent Labs  09/22/14 1654  LIPASE 29   CBC:  Recent Labs  09/22/14 1654  09/24/14 1815 09/25/14 0350  WBC 9.9  < > 8.9 8.3  NEUTROABS 8.6*  --   --   --    HGB 11.2*  < > 11.1* 10.9*  HCT 34.7*  < > 36.1 35.2*  MCV 89.2  < > 90.5 88.9  PLT 274  < > 268 250  < > = values in this interval not displayed. Cardiac Enzymes: No results for input(s): CKTOTAL, CKMB, CKMBINDEX, TROPONINI in the last 72 hours. BNP: No results for input(s): PROBNP in the last 72 hours. D-Dimer: No results for input(s): DDIMER in the last 72 hours. Hemoglobin A1C:  Recent Labs  09/22/14 2115  HGBA1C 6.1*   Fasting Lipid Panel:  Recent Labs  09/22/14 2115  CHOL 120  HDL 53  LDLCALC 48  TRIG 94  CHOLHDL 2.3   Thyroid Function Tests:  Recent Labs  09/22/14 2115  TSH 2.218     Radiology/Studies:  Dg Chest Port 1 View  09/23/2014   CLINICAL DATA:  Shortness of breath.  Chest congestion.  EXAM: PORTABLE CHEST - 1 VIEW  COMPARISON:  Chest x-rays dated 06/21/2014, 02/26/2014, 07/24/2013 and 07/06/2013 and 04/12/2012  FINDINGS: There is cardiomegaly with new pulmonary vascular congestion and very slight accentuation of the interstitial markings. No discrete effusions. No acute osseous abnormality.  IMPRESSION: Pulmonary vascular congestion with minimal interstitial edema.   Electronically Signed   By: Lorriane Shire M.D.   On: 09/23/2014 14:30   Dg Abd Portable 1v  09/23/2014   CLINICAL DATA:  Abdominal distention and pain.  Ascites.  EXAM: PORTABLE ABDOMEN - 1 VIEW  COMPARISON:  CT scan dated 09/12/2014  FINDINGS: Bowel gas pattern is normal. Residual contrast in the appendix from the recent CT scan. Diffuse degenerative changes and scoliosis in the lumbar spine. Chronic degenerative or posttraumatic changes of the right pubic body. No acute osseous abnormality. Healed left hip fracture.  IMPRESSION: No acute abnormality of the abdomen.   Electronically Signed   By: Lorriane Shire M.D.   On: 09/23/2014 14:43    PHYSICAL EXAM GENERAL: Ill appearing [name] HEENT: PERRL, EOMI, sclera are clear. Oropharynx is clear. NECK: No jugular venous distention,  carotid upstroke brisk and symmetric, no bruits, no thyromegaly or adenopathy LUNGS: few coarse BS CHEST: Unremarkable HEART: IRRR, PMI not displaced or sustained,S1 and S2 within normal limits, no S3, no S4: 2/6 systolic murmur RUSB. No rub. ABD: Soft, distended, nontender. BS +, no masses or bruits. No hepatomegaly, no splenomegaly EXT: 2 + pulses throughout, no edema, no cyanosis no clubbing SKIN: Warm and dry. No rashes NEURO: Alert and oriented x 3. Cranial nerves II through XII intact. PSYCH: Cognitively intact   ASSESSMENT AND PLAN: 1. Chronic systolic CHF. EF is clearly worse by Echo. I think she is euvolemic today. She has flat JV pulse and no peripheral edema.  Limited response to diuretic but need to be careful with too much diuresis with recent large volume paracentesis. I don't think her ascites is explained by right heart failure. Right heart function looks pretty good by Echo and pulmonary pressures are only mildly increased. No LE edema now and JV pulse looks normal. She is not a candidate for ACEi/ARB due to CKD. We switched diltiazem to a beta blocker given low EF. Will use bisoprolol 5 mg daily to avoid bronchospasm. It is interesting to note significant weight loss prior to admission. Prior weights were in the 175-180 lb range and now 160. Plan to resume anticoagulation with Eliquis once invasive procedures no longer needed. For now on IV heparin. 2. Ascites. Etiology unclear cytology demonstrates atypical cells suspicious for malignancy. Markedly elevated CA 125 suggests possible ovarian malignancy. This may explain her weight loss and persistent nausea.  3. Atrial fibrillation. Persistent. I would favor rate control only with beta blocker. Amiodarone discontinued. 4. CKD stage 3-4 5. Mild to moderate aortic stenosis.  6. S/p bilateral CEA 7. HTN 8. COPD.  9. S/p GI bleed in April due to gastric ulcers.    Present on Admission:  . Atrial fibrillation . CKD  (chronic kidney disease), stage III . Hyperlipidemia . Hypertension . SBP (spontaneous bacterial peritonitis) . Chronic combined systolic and diastolic heart failure  Signed, Orlandus Borowski [place], [place] 09/25/2014 11:42 AM

## 2014-09-25 NOTE — Progress Notes (Signed)
Patient has been eating limited amounts of solid food, but has been vomiting.  CA 125 level moderately elevated at 175. CA-19-9 and CEA normal.  Patient advised of concern regarding possible malignant ascites. In the meantime, however, her low cardiac ejection fraction (30-35%) by current echo makes a possible that her ascites is of cardiac origin, and that the high cell count is potentially due to SBP.  Plan:  1. Await cytology results from yesterday's paracentesis 2. If inconclusive, consider oncology consultation for further guidance. We might need to consider diagnostic laparoscopy.  Cleotis Nipper, M.D. Pager [phone] If no answer or after 5 PM call [phone]

## 2014-09-25 NOTE — Progress Notes (Signed)
[facility] PROGRESS NOTE  [name] [name] JSE:[id number] DOB: 1923-09-25 DOA: 09/22/2014 PCP: [name]  Brief narrative: 09/24/2014: I have seen and examined [name] [name] at bedside and reviewed her chart including off service note by Dr. Wynelle Cleveland. [facility]. [name] [name] is a pleasant 81 y.o. female with CAD, HTN, HLD, COPD, carotid artery disease s/p bilateral CEA, atrial fibrillation and chronic diastolic CHF, ascites (unknown cause), who presented with increased abdominal distension, now status post paracentesis twice with concern for malignant ascites, final cytology results pending. She is on treatment for presumed SBP although fluid cultures have remained negative. Patient also had 2-D echocardiogram which showed a decrease in ejection fraction from 50% in 2015 to 30-35% this time around. She complains of pleuritic chest pain, and it is possible that she has healthcare associated pneumonia given she was hospitalized a couple of months ago. Will therefore change antibiotics to vancomycin/cefepime to cover for nosocomial organisms, consult cardiology regarding low ejection fraction, ?further workup and follow GI recommendations. Otherwise she complains of cough and abdominal distention but leg edema has resolved. 09/25/2014: Appreciate GI/cardiology. CA-125 elevated. More concern for ovarian CA. Shared these concerns with patient and her daughter. Await final pathology results. Meanwhile, will continue current antibiotic and follow GI/cardiology recommendations. Assessment/Plan: Principal Problem:  SBP (spontaneous bacterial peritonitis) - paracentesis on 7/14 as outpt - recently started on Cipro- Transitioned to Rocephin on admission due to vomiting- [facility] on board. -Continue Vancomycin/Cefepime  Ascites  - cause undetermined -Dr Cristina Gong spoke with pathologist in regards to ascites- fluid may have malignant cells- awaiting final path - ECHO also shows EF of  35 % today which is new-doubt ascites is from CHF - As outpt she was being treated for suspected diastolic CHF and was on 20 of Lasix a day- recently she has been taking more Lasix but not making much urine- was given 40 IV this AM- did not make much urine again- has CKD 4 so would need a much higher dose of Lasix to diurese -Defer to GI for further management   Acute Systolic CHF- diastolic CHF?- mod AS - EF found to be 30-35% today- last ECHO last yr- EF was 50 % and had mild pulm HTN - she does not have significant right heart failure on ECHO - Has mild pulm edema on CXR, possible pneumonia - Consult cardiology   Atrial fibrillation - admitting doc holding Xarelto for another paracentesis - Dr Cristina Gong agrees with checking to see if SBP is resolving - BP quite low- diltiazem held this AM- will d/c - cont Amiodarone - resume Xarelto if no more procedures planned -This patients CHA2DS2-VASc Score at least 5 - as paracentesis has been done already, start Heparin until sure that she will not need more procedures   CKD (chronic kidney disease), stage 3- 4 - follow  GI bleed in April - due to gastritis - ASA held- Xarelto continued -cont PPI  COPD - cont Symbicort, Xopenex, Singulair.  - Change antibiotics to vancomycin/cefepime  Appt with PCP: requested Code Status: full code Family Communication:  Disposition Plan: f/u on ascitic fluid path DVT prophylaxis: Heparin Consultants:GI/cardiology Procedures: Paracentesis Antibiotics:  Vancomycin 09/24/2014  Cefepime 09/24/2014  HPI/Subjective: Feels better today, less pain.  Objective: Filed Vitals:   09/25/14 1356  BP: 83/51  Pulse: 86  Temp: 97.9 F (36.6 C)  Resp: 20    Intake/Output Summary (Last 24 hours) at 09/25/14 1955 Last data filed at 09/25/14 1900  Gross per 24 hour  Intake 1772.27  ml  Output   1000 ml  Net 772.27 ml   Filed Weights   09/22/14 2015 09/23/14 0452 09/24/14 1324  Weight:  72.576 kg (160 lb) 72.576 kg (160 lb) 69.627 kg (153 lb 8 oz)    Exam:   General:  Comfortable at rest.  Cardiovascular: S1-S2 normal. No murmurs. Pulse regular.  Respiratory: Good air entry bilaterally. No rhonchi or rales.  Abdomen: Soft and nontender. Normal bowel sounds. No organomegaly.  Musculoskeletal: No pedal edema   Neurological: Intact  Data Reviewed: Basic Metabolic Panel:  Recent Labs Lab 09/22/14 1654 09/22/14 2115 09/23/14 0441 09/25/14 0350  NA 136  --  138 141  K 3.8  --  3.5 3.9  CL 100*  --  101 102  CO2 27  --  28 30  GLUCOSE 95  --  99 87  BUN 30*  --  30* 32*  CREATININE 2.04* 1.97* 1.93* 1.91*  CALCIUM 8.4*  --  8.1* 8.6*  MG  --   --  2.0  --   PHOS  --   --  3.6  --    Liver Function Tests:  Recent Labs Lab 09/22/14 1654 09/23/14 0441  AST 24 20  ALT 17 14  ALKPHOS 64 70  BILITOT 0.5 0.4  PROT 5.9* 5.4*  ALBUMIN 2.5* 2.3*    Recent Labs Lab 09/22/14 1654  LIPASE 29   No results for input(s): AMMONIA in the last 168 hours. CBC:  Recent Labs Lab 09/22/14 1654 09/22/14 2115 09/23/14 0441 09/24/14 1815 09/25/14 0350  WBC 9.9 8.7 7.7 8.9 8.3  NEUTROABS 8.6*  --   --   --   --   HGB 11.2* 10.8* 10.6* 11.1* 10.9*  HCT 34.7* 33.8*  34.9 33.1* 36.1 35.2*  MCV 89.2 89.7 89.5 90.5 88.9  PLT 274 266 281 268 250   Cardiac Enzymes: No results for input(s): CKTOTAL, CKMB, CKMBINDEX, TROPONINI in the last 168 hours. BNP (last 3 results)  Recent Labs  02/26/14 1550 09/12/14 1448 09/22/14 1701  BNP 292.3* 360.2* 165.9*    ProBNP (last 3 results) No results for input(s): PROBNP in the last 8760 hours.  CBG:  Recent Labs Lab 09/23/14 0817 09/24/14 0745 09/25/14 0808  GLUCAP 101* 78 125*    Recent Results (from the past 240 hour(s))  Culture, body fluid-bottle     Status: None   Collection Time: 09/18/14 12:33 PM  Result Value Ref Range Status   Specimen Description FLUID ABDOMEN PERITONEAL  Final   Special  Requests BAA 5MLS  Final   Culture NO GROWTH 5 DAYS  Final   Report Status 09/23/2014 FINAL  Final  Gram stain     Status: None   Collection Time: 09/18/14 12:33 PM  Result Value Ref Range Status   Specimen Description FLUID ABDOMEN PERITONEAL  Final   Special Requests NONE  Final   Gram Stain   Final    MODERATE WBC PRESENT, PREDOMINANTLY PMN NO ORGANISMS SEEN    Report Status 09/19/2014 FINAL  Final  AFB culture with smear     Status: None (Preliminary result)   Collection Time: 09/23/14 10:39 AM  Result Value Ref Range Status   Specimen Description ABDOMEN  Final   Special Requests NONE  Final   Acid Fast Smear   Final    NO ACID FAST BACILLI SEEN Performed at [facility]    Culture   Final    CULTURE WILL BE EXAMINED FOR 6 WEEKS  BEFORE ISSUING A FINAL REPORT Performed at [facility]    Report Status PENDING  Incomplete  Culture, body fluid-bottle     Status: None (Preliminary result)   Collection Time: 09/23/14 10:39 AM  Result Value Ref Range Status   Specimen Description FLUID ABDOMEN PERITONEAL  Final   Special Requests NONE  Final   Culture NO GROWTH 2 DAYS  Final   Report Status PENDING  Incomplete  Gram stain     Status: None   Collection Time: 09/23/14 10:39 AM  Result Value Ref Range Status   Specimen Description FLUID ABDOMEN PERITONEAL  Final   Special Requests NONE  Final   Gram Stain   Final    ABUNDANT WBC PRESENT,BOTH PMN AND MONONUCLEAR NO ORGANISMS SEEN    Report Status 09/23/2014 FINAL  Final     Studies: No results found.  Scheduled Meds: . allopurinol  100 mg Oral q morning - 10a  . bisoprolol  5 mg Oral Daily  . budesonide-formoterol  2 puff Inhalation BID  . cefTAZidime (FORTAZ)  IV  1 g Intravenous Q24H  . cholecalciferol  1,000 Units Oral QHS  . cholecalciferol  2,000 Units Oral Daily  . docusate sodium  100 mg Oral BID  . feeding supplement (ENSURE ENLIVE)  237 mL Oral BID BM  . folic acid  1 mg Oral Daily  .  montelukast  10 mg Oral QHS  . multivitamin with minerals  1 tablet Oral Daily  . pantoprazole  80 mg Oral QPM  . raloxifene  60 mg Oral q morning - 10a  . saccharomyces boulardii  250 mg Oral BID  . sodium chloride  3 mL Intravenous Q12H  . thiamine  100 mg Oral Daily  . vancomycin  1,000 mg Intravenous Q48H   Continuous Infusions: . heparin 1,100 Units/hr (09/24/14 2346)     Time spent: 25 minutes    [name] [name]  [facility] Pager [phone]. If 7PM-7AM, please contact night-coverage at [email or web address], password [facility] 09/25/2014, 7:55 PM  LOS: 3 days

## 2014-09-25 NOTE — Evaluation (Signed)
Physical Therapy Evaluation Patient Details Name: [name] [name] MRN: [id number] DOB: [birth year] Today's Date: 09/25/2014   History of Present Illness  81 yo female admitted with 09/22/14 with abdominal pain, ascites, Afib, Pna. Hx of CVA, Afib, CAD. Pt lives alone in senior apartments,  Clinical Impression  Patient reports feeling better. Patient did not want to walk. Assisted to [facility]. Patient will benefit from PT to address problems listed in note below.    Follow Up Recommendations SNF;Supervision/Assistance - 24 hour    Equipment Recommendations  None recommended by PT    Recommendations for Other Services       Precautions / Restrictions Precautions Precautions: Fall      Mobility  Bed Mobility                  Transfers Overall transfer level: Needs assistance Equipment used: 2 person hand held assist Transfers: Sit to/from [employer] Transfers Sit to Stand: Mod assist Stand pivot transfers: Mod assist       General transfer comment: able to take a few steps with HHA  Ambulation/Gait                Stairs            Wheelchair Mobility    Modified Rankin (Stroke Patients Only)       Balance Overall balance assessment: Needs assistance Sitting-balance support: Feet supported;Bilateral upper extremity supported Sitting balance-Leahy Scale: Good     Standing balance support: Bilateral upper extremity supported Standing balance-Leahy Scale: Fair                               Pertinent Vitals/Pain Pain Assessment: 0-10 Pain Score: 4  Pain Location: abdomen Pain Descriptors / Indicators: Aching;Tender Pain Intervention(s): Limited activity within patient's tolerance    Home Living Family/patient expects to be discharged to:: Private residence Living Arrangements: Alone Available Help at Discharge: Personal care attendant Type of Home: Apartment Home Access: Level entry     Home Layout: One level Home  Equipment: Environmental consultant - 4 wheels;Wheelchair - [employer] - 2 wheels Additional Comments: [place]    Prior Function Level of Independence: Independent with assistive device(s)         Comments: wears o2 at night     Hand Dominance        Extremity/Trunk Assessment   Upper Extremity Assessment: Generalized weakness           Lower Extremity Assessment: Generalized weakness      Cervical / Trunk Assessment: Normal  Communication      Cognition Arousal/Alertness: Awake/alert Behavior During Therapy: WFL for tasks assessed/performed Overall Cognitive Status: Within Functional Limits for tasks assessed                      General Comments      Exercises        Assessment/Plan    PT Assessment Patient needs continued PT services  PT Diagnosis Difficulty walking;Generalized weakness;Acute pain   PT Problem List Decreased activity tolerance;Pain;Decreased knowledge of use of DME;Decreased mobility  PT Treatment Interventions DME instruction;Gait training;Stair training;Functional mobility training;Therapeutic activities;Therapeutic exercise;Patient/family education   PT Goals (Current goals can be found in the Care Plan section) Acute Rehab PT Goals Patient Stated Goal: i Don't want to go to [facility] PT Goal Formulation: With patient Time For Goal Achievement: 10/09/14 Potential to Achieve Goals: Good    Frequency Min 3X/week  Barriers to discharge Decreased caregiver support      Co-evaluation               End of Session   Activity Tolerance: Patient tolerated treatment well Patient left: in chair;with call bell/phone within reach Nurse Communication: Mobility status         Time: 1430-1450 PT Time Calculation (min) (ACUTE ONLY): 20 min   Charges:   PT Evaluation $Initial PT Evaluation Tier I: 1 Procedure     PT G CodesClaretha Cooper 09/25/2014, 3:08 PM Tresa Endo PT [phone]

## 2014-09-25 NOTE — Progress Notes (Signed)
ANTICOAGULATION CONSULT NOTE - Follow Up Consult  Pharmacy Consult for Heparin Indication: atrial fibrillation  Allergies  Allergen Reactions  . Baby Powder [Methylbenzethonium] Shortness Of Breath  . Demerol [Meperidine] Hives  . Fexofenadine Other (See Comments)    dizziness  . Penicillins Hives    Tolerates Ceftriaxone.  Marland Kitchen Spiriva Handihaler [Tiotropium Bromide Monohydrate]     Caused chest pains    Patient Measurements: Height: 5\' 1"  (154.9 cm) Weight: 153 lb 8 oz (69.627 kg) IBW/kg (Calculated) : 47.8 Heparin Dosing Weight:   Vital Signs: Temp: 97.9 F (36.6 C) (07/21 1356) Temp Source: Oral (07/21 1356) BP: 83/51 mmHg (07/21 1356) Pulse Rate: 86 (07/21 1356)  Labs:  Recent Labs  09/22/14 2115 09/23/14 0441  09/24/14 0419 09/24/14 1815 09/25/14 0350 09/25/14 1330  HGB 10.8* 10.6*  --   --  11.1* 10.9*  --   HCT 33.8*  34.9 33.1*  --   --  36.1 35.2*  --   PLT 266 281  --   --  268 250  --   APTT  --  36  < > 54* 51* 85* 75*  LABPROT  --  16.9*  --   --   --   --   --   INR  --  1.36  --   --   --   --   --   HEPARINUNFRC  --   --   --  1.68* 1.36* 1.20*  --   CREATININE 1.97* 1.93*  --   --   --  1.91*  --   < > = values in this interval not displayed.  Estimated Creatinine Clearance: 18.2 mL/min (by C-G formula based on Cr of 1.91).   Medications:  Infusions:  . heparin 1,100 Units/hr (09/24/14 2346)    Assessment: 89 yoF on apixaban PTA for hx PAF, CAD s/p bilateral CEA, chronic diastolic CHF (EF 87-56%), and ascites admitted on 7/18 for SBP and vomiting. Anticoagulation placed on hold for paracentesis on 7/19. Pharmacy consulted to start IV heparin after paracentesis completed in case patient required further procedures.   Poor renal function: CrCl ~18 ml/min Last dose apixaban 2.5mg  on 7/18 @ 0930 CBC: Hgb a little low but relatively stable, plts WNL  Heparin levels have been elevated however likely influenced by apixaban.  Therefore will  monitor aPTTs until these levels correlate with heparin levels (and apixaban finally cleared, delayed with poor renal fxn)   7/21: Heparin infusion at 1100 units/hr = 11 ml/hr.  No issues noted per RN.   APTT remains within goal, heparin level still pending.   Goal of Therapy:  Heparin level 0.3-0.7 units/ml aPTT 66-102 seconds Monitor platelets by anticoagulation protocol: Yes   Plan:  Continue heparin drip at current rate F/u daily HL and aPTT until levels correlate

## 2014-09-25 NOTE — Progress Notes (Signed)
ANTICOAGULATION CONSULT NOTE - Follow Up Consult  Pharmacy Consult for Heparin Indication: atrial fibrillation  Allergies  Allergen Reactions  . Baby Powder [Methylbenzethonium] Shortness Of Breath  . Demerol [Meperidine] Hives  . Fexofenadine Other (See Comments)    dizziness  . Penicillins Hives    Tolerates Ceftriaxone.  Marland Kitchen Spiriva Handihaler [Tiotropium Bromide Monohydrate]     Caused chest pains    Patient Measurements: Height: 5\' 1"  (154.9 cm) Weight: 153 lb 8 oz (69.627 kg) IBW/kg (Calculated) : 47.8 Heparin Dosing Weight:   Vital Signs: Temp: 97.9 F (36.6 C) (07/21 0448) Temp Source: Oral (07/21 0448) BP: 88/64 mmHg (07/21 0448) Pulse Rate: 75 (07/21 0448)  Labs:  Recent Labs  09/22/14 1654 09/22/14 2115 09/23/14 0441  09/24/14 0419 09/24/14 1815 09/25/14 0350  HGB 11.2* 10.8* 10.6*  --   --  11.1* 10.9*  HCT 34.7* 33.8*  34.9 33.1*  --   --  36.1 35.2*  PLT 274 266 281  --   --  268 250  APTT  --   --  36  < > 54* 51* 85*  LABPROT  --   --  16.9*  --   --   --   --   INR  --   --  1.36  --   --   --   --   HEPARINUNFRC  --   --   --   --  1.68* 1.36* 1.20*  CREATININE 2.04* 1.97* 1.93*  --   --   --   --   < > = values in this interval not displayed.  Estimated Creatinine Clearance: 18 mL/min (by C-G formula based on Cr of 1.93).   Medications:  Infusions:  . heparin 1,100 Units/hr (09/24/14 2346)    Assessment: Patient with high heparin level and PTT at goal.  Due to prior apixaban, will consider the heparin level falsely elevated and will use the PTT.  PTT ordered with Heparin level until both correlate due to possible drug-lab interaction between rivaroxaban and anti-Xa level (aka heparin level)   Goal of Therapy:  Heparin level 0.3-0.7 units/ml aPTT 66-102 seconds Monitor platelets by anticoagulation protocol: Yes   Plan:  Continue heparin drip at current rate Recheck level at [employer], [place] 09/25/2014,5:54  AM

## 2014-09-25 NOTE — Progress Notes (Signed)
PT Cancellation Note  Patient Details Name: Gizelle Whetsel MRN: [id number] DOB: [birth year]   Cancelled Treatment:    Reason Eval/Treat Not Completed: Fatigue/lethargy limiting ability to participate (up in recliner, states not  able to walk)   Claretha Cooper 09/25/2014, 2:32 PM Tresa Endo PT [phone]

## 2014-09-26 DIAGNOSIS — R180 Malignant ascites: Secondary | ICD-10-CM

## 2014-09-26 DIAGNOSIS — R110 Nausea: Secondary | ICD-10-CM

## 2014-09-26 DIAGNOSIS — C801 Malignant (primary) neoplasm, unspecified: Secondary | ICD-10-CM

## 2014-09-26 DIAGNOSIS — I10 Essential (primary) hypertension: Secondary | ICD-10-CM

## 2014-09-26 DIAGNOSIS — J449 Chronic obstructive pulmonary disease, unspecified: Secondary | ICD-10-CM

## 2014-09-26 DIAGNOSIS — R140 Abdominal distension (gaseous): Secondary | ICD-10-CM

## 2014-09-26 DIAGNOSIS — R0602 Shortness of breath: Secondary | ICD-10-CM

## 2014-09-26 LAB — CBC
HCT: 34.1 % — ABNORMAL LOW (ref 36.0–46.0)
Hemoglobin: 10.7 g/dL — ABNORMAL LOW (ref 12.0–15.0)
MCH: 28.3 pg (ref 26.0–34.0)
MCHC: 31.4 g/dL (ref 30.0–36.0)
MCV: 90.2 fL (ref 78.0–100.0)
PLATELETS: 249 10*3/uL (ref 150–400)
RBC: 3.78 MIL/uL — AB (ref 3.87–5.11)
RDW: 15.3 % (ref 11.5–15.5)
WBC: 8.6 10*3/uL (ref 4.0–10.5)

## 2014-09-26 LAB — APTT
APTT: 65 s — AB (ref 24–37)
aPTT: 46 seconds — ABNORMAL HIGH (ref 24–37)
aPTT: 55 seconds — ABNORMAL HIGH (ref 24–37)

## 2014-09-26 LAB — CLOSTRIDIUM DIFFICILE BY PCR: Toxigenic C. Difficile by PCR: NEGATIVE

## 2014-09-26 LAB — GLUCOSE, CAPILLARY: Glucose-Capillary: 90 mg/dL (ref 65–99)

## 2014-09-26 LAB — HEPARIN LEVEL (UNFRACTIONATED)
HEPARIN UNFRACTIONATED: 0.42 [IU]/mL (ref 0.30–0.70)
Heparin Unfractionated: 0.69 IU/mL (ref 0.30–0.70)
Heparin Unfractionated: 0.63 IU/mL (ref 0.30–0.70)

## 2014-09-26 MED ORDER — HEPARIN (PORCINE) IN NACL 100-0.45 UNIT/ML-% IJ SOLN
1200.0000 [IU]/h | INTRAMUSCULAR | Status: DC
  Administered 2014-09-26 – 2014-09-27 (×2): 1200 [IU]/h via INTRAVENOUS
  Filled 2014-09-26 (×2): qty 250

## 2014-09-26 MED ORDER — HEPARIN (PORCINE) IN NACL 100-0.45 UNIT/ML-% IJ SOLN
1100.0000 [IU]/h | INTRAMUSCULAR | Status: DC
  Filled 2014-09-26 (×2): qty 250

## 2014-09-26 MED ORDER — BISOPROLOL FUMARATE 5 MG PO TABS
2.5000 mg | ORAL_TABLET | Freq: Every day | ORAL | Status: DC
  Administered 2014-09-27: 2.5 mg via ORAL
  Filled 2014-09-26: qty 0.5

## 2014-09-26 MED ORDER — HEPARIN (PORCINE) IN NACL 100-0.45 UNIT/ML-% IJ SOLN
1000.0000 [IU]/h | INTRAMUSCULAR | Status: DC
  Filled 2014-09-26: qty 250

## 2014-09-26 NOTE — Progress Notes (Signed)
Physical Therapy Treatment Patient Details Name: [name] [name] MRN: [id number] DOB: 1924/01/27 Today's Date: 2014-10-25    History of Present Illness 81 yo female admitted with 09/22/14 with abdominal pain, ascites, Afib, Pna. Hx of CVA, Afib, CAD. Pt lives alone in senior apartments,    PT Comments    Pt assisted with ambulating in room and used BSC as well prior to being assisted back to bed.  Follow Up Recommendations  SNF;Supervision/Assistance - 24 hour     Equipment Recommendations  None recommended by PT    Recommendations for Other Services       Precautions / Restrictions Precautions Precautions: Fall    Mobility  Bed Mobility Overal bed mobility: Needs Assistance Bed Mobility: Sit to Supine       Sit to supine: Mod assist   General bed mobility comments: assist for LEs onto bed due to fatigue  Transfers Overall transfer level: Needs assistance Equipment used: Rolling walker (2 wheeled) Transfers: Sit to/from Stand Sit to Stand: Min assist         General transfer comment: assist to rise and steady required from [facility], verbal cues for hand placement  Ambulation/Gait Ambulation/Gait assistance: Min assist Ambulation Distance (Feet): 20 Feet (total) Assistive device: Rolling walker (2 wheeled) Gait Pattern/deviations: Step-through pattern;Decreased stride length     General Gait Details: performed 10 feet and became dizzy so pull up chair behind pt, pt then needed to use BSC to switched out chairs, pt then assisted with ambulating back to bed   Stairs            Wheelchair Mobility    Modified Rankin (Stroke Patients Only)       Balance                                    Cognition Arousal/Alertness: Awake/alert Behavior During Therapy: WFL for tasks assessed/performed Overall Cognitive Status: Within Functional Limits for tasks assessed                      Exercises      General Comments         Pertinent Vitals/Pain Pain Assessment: 0-10 Pain Score: 4  Pain Location: "stomach" Pain Descriptors / Indicators: Aching Pain Intervention(s): Limited activity within patient's tolerance;Monitored during session    Home Living                      Prior Function            PT Goals (current goals can now be found in the care plan section) Progress towards PT goals: Progressing toward goals    Frequency  Min 3X/week    PT Plan Current plan remains appropriate    Co-evaluation             End of Session Equipment Utilized During Treatment: Gait belt Activity Tolerance: Patient limited by fatigue Patient left: with call bell/phone within reach;in bed;with family/visitor present (with lab (blood draw))     Time: 1749-4496 PT Time Calculation (min) (ACUTE ONLY): 15 min  Charges:  $Gait Training: 8-22 mins                    G Codes:      [name] [name],[name] [name] 2014/10/25, 2:16 PM [name] [name], PT, DPT 10/25/14 Pager: [phone]

## 2014-09-26 NOTE — Progress Notes (Signed)
Patient Name: [name] [name] Date of Encounter: 09/26/2014  Primary Cardiologist Darlina Guys MD   Principal Problem:   SBP (spontaneous bacterial peritonitis) Active Problems:   Hyperlipidemia   Atrial fibrillation   Chronic combined systolic and diastolic heart failure   Hypertension   CKD (chronic kidney disease), stage III   CHF exacerbation   Ascites   Acute on chronic combined systolic and diastolic congestive heart failure    SUBJECTIVE  Denies any discomfort. Says her LE edema has significantly improved, however her abdomen still distended. Sitting in bed, appears to be comfortable. Denies any dizziness.   CURRENT MEDS . allopurinol  100 mg Oral q morning - 10a  . bisoprolol  5 mg Oral Daily  . budesonide-formoterol  2 puff Inhalation BID  . cefTAZidime (FORTAZ)  IV  1 g Intravenous Q24H  . cholecalciferol  1,000 Units Oral QHS  . cholecalciferol  2,000 Units Oral Daily  . docusate sodium  100 mg Oral BID  . feeding supplement (ENSURE ENLIVE)  237 mL Oral BID BM  . folic acid  1 mg Oral Daily  . montelukast  10 mg Oral QHS  . multivitamin with minerals  1 tablet Oral Daily  . pantoprazole  80 mg Oral QPM  . raloxifene  60 mg Oral q morning - 10a  . saccharomyces boulardii  250 mg Oral BID  . sodium chloride  3 mL Intravenous Q12H  . thiamine  100 mg Oral Daily  . vancomycin  1,000 mg Intravenous Q48H    OBJECTIVE  Filed Vitals:   09/26/14 0530 09/26/14 0536 09/26/14 0540 09/26/14 0726  BP: 76/53 94/54 84/43    Pulse: 91 107 95   Temp:   98 F (36.7 C)   TempSrc:   Oral   Resp:   20   Height:      Weight:    154 lb (69.854 kg)  SpO2: 91%  98%     Intake/Output Summary (Last 24 hours) at 09/26/14 0831 Last data filed at 09/26/14 0550  Gross per 24 hour  Intake   1192 ml  Output    600 ml  Net    592 ml   Filed Weights   09/23/14 0452 09/24/14 0638 09/26/14 0726  Weight: 160 lb (72.576 kg) 153 lb 8 oz (69.627 kg) 154 lb (69.854 kg)     PHYSICAL EXAM  General: Pleasant, NAD. Neuro: Alert and oriented X 3. Moves all extremities spontaneously. Psych: Normal affect. HEENT:  Normal  Neck: Supple without bruits or JVD. Lungs:  Resp regular and unlabored, CTA. Heart: irregular. no s3, s4, or murmurs. Abdomen: Soft, non-tender, BS + x 4. Moderately distended Extremities: No clubbing, cyanosis or edema. DP/PT/Radials 2+ and equal bilaterally.  Accessory Clinical Findings  CBC  Recent Labs  09/25/14 0350 09/26/14 0335  WBC 8.3 8.6  HGB 10.9* 10.7*  HCT 35.2* 34.1*  MCV 88.9 90.2  PLT 250 417   Basic Metabolic Panel  Recent Labs  09/25/14 0350  NA 141  K 3.9  CL 102  CO2 30  GLUCOSE 87  BUN 32*  CREATININE 1.91*  CALCIUM 8.6*    TELE Not on telemetry    ECG  No new EKG  Echocardiogram 09/23/2014  LV EF: 30% -  35%  ------------------------------------------------------------------- Indications:   CHF - 428.0.  ------------------------------------------------------------------- History:  PMH: Carotid Stenosis. Lower Extremity Edema. Dyspnea. Atrial fibrillation. Coronary artery disease. Chronic obstructive pulmonary disease. Risk factors: Hypertension. Dyslipidemia.  ------------------------------------------------------------------- Study Conclusions  -  Left ventricle: The cavity size was mildly dilated. Wall thickness was normal. Systolic function was moderately to severely reduced. The estimated ejection fraction was in the range of 30% to 35%. Diffuse hypokinesis. Doppler parameters are consistent with high ventricular filling pressure. - Aortic valve: Valve mobility was restricted. There was moderate stenosis. Valve area (VTI): 1.36 cm^2. Valve area (Vmax): 1.31 cm^2. Valve area (Vmean): 1.26 cm^2. - Mitral valve: Calcified annulus. There was mild regurgitation. - Left atrium: The atrium was severely dilated. - Right ventricle: Systolic function was  mildly reduced. - Pulmonary arteries: Systolic pressure was mildly increased. - Pericardium, extracardiac: A trivial pericardial effusion was identified. There was a left pleural effusion.  Impressions:  - Moderate to severe global reduction in LV function; mild LVE; severe LAE; mildly reduced RV function; calcified aortic valve with reduced cusp excursion; moderate AS by continuity equation; mild MR and TR; mildly elevated pulmonary pressure. Compared to 09/26/13, LV function is worse and aortic stenosis may be mildly worse.     Radiology/Studies  Ct Abdomen Pelvis Wo Contrast  09/12/2014   CLINICAL DATA:  81 year old female with abdominal pain and distention.  EXAM: CT ABDOMEN AND PELVIS WITHOUT CONTRAST  TECHNIQUE: Multidetector CT imaging of the abdomen and pelvis was performed following the standard protocol without IV contrast.  COMPARISON:  11/03/2011 CT  FINDINGS: Please note that parenchymal abnormalities may be missed without intravenous contrast.  Lower chest: Cardiomegaly, tiny bilateral pleural effusions and bibasilar atelectasis identified.  Hepatobiliary: The liver and gallbladder are unremarkable. There is no evidence of biliary dilatation.  Pancreas: Unremarkable  Spleen: Unremarkable  Adrenals/Urinary Tract: Bilateral renal atrophy identified. The adrenal glands and bladder are unremarkable.  Stomach/Bowel: There is possible circumferential wall thickening of the distal stomach. Colonic diverticulosis is identified without evidence of diverticulitis. No other focal bowel wall thickening is identified.  Vascular/Lymphatic: Abdominal aortic atherosclerotic calcifications are noted. There is no evidence of abdominal aortic aneurysm. No enlarged lymph nodes are noted.  Reproductive: The uterus and adnexal regions are unremarkable.  Other: A large amount of ascites is noted. Stranding in the omentum is present. No gross peritoneal nodules or masses are identified. There is  no evidence of pneumoperitoneum.  Musculoskeletal: Surgical hardware within the proximal left femur is noted. Moderate -severe degenerative changes in the lumbar spine are identified. Grade 1 anterolisthesis of L4 on L5 is unchanged.  IMPRESSION: Large amount of ascites without identifiable cause. Omental stranding may represent edema but tumor involvement of the omentum is not entirely excluded. No adnexal masses identified.  Possible distal stomach wall thickening. Consider direct inspection as clinically indicated.  Bilateral renal atrophy.  Cardiomegaly, tiny bilateral pleural effusions and mild bibasilar atelectasis.   Electronically Signed   By: Margarette Canada M.D.   On: 09/12/2014 18:34   US Paracentesis  09/23/2014   INDICATION: Recurrent ascites, request for diagnostic and therapeutic paracentesis.  EXAM: ULTRASOUND-GUIDED PARACENTESIS  COMPARISON:  Paracentesis 09/18/14.  MEDICATIONS: None.  COMPLICATIONS: None immediate  TECHNIQUE: Informed written consent was obtained from the patient after a discussion of the risks, benefits and alternatives to treatment. A timeout was performed prior to the initiation of the procedure.  Initial ultrasound scanning demonstrates a moderate amount of ascites within the right lower abdominal quadrant. The right lower abdomen was prepped and draped in the usual sterile fashion. 1% lidocaine was used for local anesthesia.  Under direct ultrasound guidance, a 19 gauge, 10-cm, Yueh catheter was introduced. An ultrasound image was saved for documentation purposed. The  paracentesis was performed. The catheter was removed and a dressing was applied. The patient tolerated the procedure well without immediate post procedural complication.  FINDINGS: A total of approximately 3.1 liters of serous fluid was removed. Samples were sent to the laboratory as requested by the clinical team.  IMPRESSION: Successful ultrasound-guided paracentesis yielding 3.1 liters of peritoneal fluid.  Read  By:  Tsosie Billing PA-C   Electronically Signed   By: Corrie Mckusick D.O.   On: 09/23/2014 15:33   US Paracentesis  09/18/2014   INDICATION: Intra-abdominal ascites of uncertain etiology. Please perform ultrasound-guided paracentesis for diagnostic and therapeutic purposes.  EXAM: ULTRASOUND-GUIDED PARACENTESIS  COMPARISON:  None.  MEDICATIONS: 10 cc 1% lidocaine  COMPLICATIONS: None immediate  TECHNIQUE: Informed written consent was obtained from the patient after a discussion of the risks, benefits and alternatives to treatment. A timeout was performed prior to the initiation of the procedure.  Initial ultrasound scanning demonstrates a large amount of ascites within the right lower abdominal quadrant. The right lower abdomen was prepped and draped in the usual sterile fashion. 1% lidocaine with epinephrine was used for local anesthesia. Under direct ultrasound guidance, a 19 gauge, 7-cm, Yueh catheter was introduced. An ultrasound image was saved for documentation purposed. The paracentesis was performed. The catheter was removed and a dressing was applied. The patient tolerated the procedure well without immediate post procedural complication.  FINDINGS: A total of approximately 3.8 liters of yellow fluid was removed. Samples were sent to the laboratory as requested by the clinical team.  IMPRESSION: Successful ultrasound-guided paracentesis yielding 3.8 liters of peritoneal fluid.  50 gr IV albumin during procedure per MD.  Read by:  Lavonia Drafts [facility]   Electronically Signed   By: Sandi Mariscal M.D.   On: 09/18/2014 15:46   Dg Chest Port 1 View  09/23/2014   CLINICAL DATA:  Shortness of breath.  Chest congestion.  EXAM: PORTABLE CHEST - 1 VIEW  COMPARISON:  Chest x-rays dated 06/21/2014, 02/26/2014, 07/24/2013 and 07/06/2013 and 04/12/2012  FINDINGS: There is cardiomegaly with new pulmonary vascular congestion and very slight accentuation of the interstitial markings. No discrete effusions. No acute  osseous abnormality.  IMPRESSION: Pulmonary vascular congestion with minimal interstitial edema.   Electronically Signed   By: Lorriane Shire M.D.   On: 09/23/2014 14:30   Dg Abd Portable 1v  09/23/2014   CLINICAL DATA:  Abdominal distention and pain.  Ascites.  EXAM: PORTABLE ABDOMEN - 1 VIEW  COMPARISON:  CT scan dated 09/12/2014  FINDINGS: Bowel gas pattern is normal. Residual contrast in the appendix from the recent CT scan. Diffuse degenerative changes and scoliosis in the lumbar spine. Chronic degenerative or posttraumatic changes of the right pubic body. No acute osseous abnormality. Healed left hip fracture.  IMPRESSION: No acute abnormality of the abdomen.   Electronically Signed   By: Lorriane Shire M.D.   On: 09/23/2014 14:43    ASSESSMENT AND PLAN  1. Chronic systolic CHF.   - Ecgho 1/95/0932 EF 30-35%, moderate AS, mild MR, severe LAE. Mildly reduced RV EF  - Limited response to diuretic but need to be careful with too much diuresis with recent large volume paracentesis. I don't think her ascites is explained by right heart failure. Right heart function looks pretty good by Echo and pulmonary pressures are only mildly increased.   - She is not a candidate for ACEi/ARB due to CKD.   - on bisoprolol 5 mg daily for low EF and to avoid  bronchospasm. However SBP now in 80s. May need to cut down to 2.5mg  daily.   - Plan to resume anticoagulation with Eliquis once invasive procedures no longer needed. For now on IV heparin.  - appears to be euvolemic on exam. No need for lasix at this point, esp since BP low.   2. Ascites. Etiology unclear cytology demonstrates atypical cells suspicious for malignancy. Markedly elevated CA 125 suggests possible ovarian malignancy. This may explain her weight loss and persistent nausea.   - Recent weight loss noted. Prior weights were in the 175-180 lb range and now 160.   - s/p paracentesis 09/18/2014 3.8L hazy yellow peritoneal fluid drawn. Cytology  concerning for well differentiated adenocarcinoma.  - s/p paracentesis 09/23/2014 3.1L yellow cloudy peritoneal fluid drawn. Cytology atypical cell present concerning for malignancy  3. Persistent atrial fibrillation.   - rate control only with beta blocker. Amiodarone discontinued.  4. CKD stage 3-4 5. Mild to moderate aortic stenosis.  6. S/p bilateral CEA 7. HTN 8. COPD.  9. S/p GI bleed in April due to gastric ulcers.    Hilbert Corrigan PA-C Pager: [phone] Patient seen and examined and history reviewed. Agree with above findings and plan. BP low today. Otherwise no change in status. HR appears to be well controlled so will reduce Bystolic to 2.5 mg daily. I still have a strong suspicion that her ascites is malignant. Oncology to see.  Lilliann Rossetti [place], [place] 09/26/2014 10:24 AM

## 2014-09-26 NOTE — Progress Notes (Signed)
ANTICOAGULATION CONSULT NOTE - Follow Up Consult  Pharmacy Consult for heparin Indication: atrial fibrillation  Allergies  Allergen Reactions  . Baby Powder [Methylbenzethonium] Shortness Of Breath  . Demerol [Meperidine] Hives  . Fexofenadine Other (See Comments)    dizziness  . Penicillins Hives    Tolerates Ceftriaxone.  Marland Kitchen Spiriva Handihaler [Tiotropium Bromide Monohydrate]     Caused chest pains    Patient Measurements: Height: 5\' 1"  (154.9 cm) Weight: 154 lb (69.854 kg) IBW/kg (Calculated) : 47.8 Heparin Dosing Weight:   Vital Signs: Temp: 98.3 F (36.8 C) (07/22 2210) Temp Source: Oral (07/22 2210) BP: 80/41 mmHg (07/22 2210) Pulse Rate: 73 (07/22 2210)  Labs:  Recent Labs  09/24/14 1815 09/25/14 0350  09/26/14 0335 09/26/14 1405 09/26/14 2156  HGB 11.1* 10.9*  --  10.7*  --   --   HCT 36.1 35.2*  --  34.1*  --   --   PLT 268 250  --  249  --   --   APTT 51* 85*  < > 65* 46* 55*  HEPARINUNFRC 1.36* 1.20*  < > 0.69 0.63 0.42  CREATININE  --  1.91*  --   --   --   --   < > = values in this interval not displayed.  Estimated Creatinine Clearance: 18.2 mL/min (by C-G formula based on Cr of 1.91).   Medications:  Infusions:  . heparin      Assessment: Patient with PTT and heparin level close but still not correlating 100%.  PTT has remained below goal, while heparin levels trend down.  Will consider the PTT a more "true" result at this time.  Expect labs to correlate soon.  No heparin issues per RN.  Goal of Therapy:  Heparin level 0.3-0.7 units/ml aPTT 66-102 seconds Monitor platelets by anticoagulation protocol: Yes   Plan:  Increase heparin to 1200 units/hr Recheck level will AM labs  Tyler Deis, Shea [place] 09/26/2014,10:48 PM

## 2014-09-26 NOTE — Consult Note (Signed)
[place]  [phone]    ONCOLOGY CONSULTATION   [name] [name]  DOB: 1923-07-11  MR#: [id number]  CSN#: [id number]    Requesting Physician: [facility]  Patient Care Team: Carlos Levering, PA-C as PCP - General (Family Medicine)   Reason for consult: Malignant ascites  History of present illness:81 y.o. female admitted on 7/18 with recurrent ascites. She had initially been seen as outpatient due to increased abdominal girth and fluid retention. She underwent paracentesis on 7/14 yielding 3.8 l yellow fluid, which sample was sent for cytology, suspicious for malignancy. A CT abdomen on 7/8 had shown evidence of omental thickening without discrete findings, except for possible distal stomach wall thickening. Due to worsening of her symptoms, including increased abdominal girth, shortness of breath, nausea, intermittent vomiting, decreased appetite over the last 6 months, and lower extremity swelling she presented to the ED for evaluation. She denied any chest pain. She did have acute on chronic heart failure during this admission, followed by Cardiology. She denied any rashes or neuropathy. She denies any bleeding issues at this time, last GIB was in 06/2014 due to gastric ulcers.. She denies any other history of malignancy. She had her ovaries and uterus on presentation. She had her menarche at age 75, menopausal at 71. No family history of GI or GU malignancies. Last mammogram and pap were in 2006, normal. Last colonoscopy was in 08/2011, negative for malignancy as well. She had another paracentesis on 7/19, yielding 3.1 l of peritoneal fluid. Both, [id number] on 7/19 and [id number] on 7/14 showed findings consistent with atypical cells suspicious for malignancy.CA 125 is elevated at 175.3, CA 19.9 is 8 and CEA is 5.5, findings suspicious of ovarian primary. We were requested to see the patient in consultation with recommendations.    Past medical history:      Past Medical History  Diagnosis Date  . Hypertension   . Hyperlipidemia   . COPD (chronic obstructive pulmonary disease)   . Osteoporosis   . H/O hiatal hernia   . Chronic back pain   . CAD (coronary artery disease)     LHC 9/05:  pLAD less than 20%, D1 20-30%, ostial RCA 40-50%, proximal RCA 20%, EF 60%.  . C. difficile diarrhea   . Gout   . Atrial fibrillation     a. failed DCCV => b. amiodarone added => converted to NSR on Amiodarone;  c. Xarelto d/c'd 2/2 falling (wrist and hip Fx in 1/14)  . Carotid stenosis     s/p bilat CEA  . C. difficile colitis     10/2011;  11/2011  . Salmonella enteritis 08/2011    c/b septic shock  . Hx of echocardiogram     a. Echocardiogram 09/29/11: Mild LVH, EF 45-50%, diffuse HK, mild to moderate aortic stenosis, mean gradient 12 mmHg, moderate MAC, mild MR, moderate LAE, moderate RAE, question small secundum ASD with left to right flow not seen in 4 chamber view, PASP 35-39 (mild pulmonary hypertension). ;  b.  TEE on 10/04/11: Mild LVH, EF 55%, mild MR, no defect or PFO  . Pneumonia 11/2101  . PONV (postoperative nausea and vomiting)   . CHF (congestive heart failure)   . Chronic kidney disease   . GERD (gastroesophageal reflux disease)   . Hepatitis   . Anemia   . Hx of echocardiogram     Echo (09/2013): EF 55%, normal wall motion, mild aortic stenosis (mean 12 mm Hg), MAC, mild MR, mild LAE, mild  PI, PASP 30 mm Hg  . Hx of cardiovascular stress test     Lexiscan Myoview (7/15):  No ischemia or infarct, EF 50%, Low Risk  . Arthritis     Neck  . PAF (paroxysmal atrial fibrillation)     Past surgical history:      Past Surgical History  Procedure Laterality Date  . Carotid endarterectomy      bilateral  . Cataract extraction, bilateral    . Joint replacement  2008    Right Total Knee  . Anal fistula repair    . Left parotidectomy    . Eye surgery    . Flexible sigmoidoscopy  09/03/2011    Procedure: FLEXIBLE SIGMOIDOSCOPY;  Surgeon:  Beryle Beams, MD;  Location: [facility];  Service: Endoscopy;  Laterality: N/A;  . Tee without cardioversion  10/04/2011    Procedure: TRANSESOPHAGEAL ECHOCARDIOGRAM (TEE);  Surgeon: Larey Dresser, MD;  Location: [place];  Service: Cardiovascular;  Laterality: N/A;  to be carelinked here by 1230-verified 7/29/dl  . Cardioversion  10/04/2011    Procedure: CARDIOVERSION;  Surgeon: Larey Dresser, MD;  Location: [facility]COPY;  Service: Cardiovascular;  Laterality: N/A;  . Esophagogastroduodenoscopy  11/29/2011    Procedure: ESOPHAGOGASTRODUODENOSCOPY (EGD);  Surgeon: Cleotis Nipper, MD;  Location: [facility];  Service: Endoscopy;  Laterality: N/A;  recent h/o resp failure/pneumonia  . Left hip fracture repair  03/14/12  . Open reduction internal fixation (orif) scaphoid with distal radius graft  03/14/2012    Procedure: OPEN REDUCTION INTERNAL FIXATION (ORIF) SCAPHOID WITH DISTAL RADIUS GRAFT;  Surgeon: Jolyn Nap, MD;  Location: [facility];  Service: Orthopedics;  Laterality: Left;  DVR wrist fracture set/ hand innovation  . Hip pinning,cannulated  03/14/2012    Procedure: CANNULATED HIP PINNING;  Surgeon: Jolyn Nap, MD;  Location: [facility];  Service: Orthopedics;  Laterality: Left;  Biomet 6.5 cannulated screws  . Femur im nail Left 04/13/2012    Procedure: INTRAMEDULLARY (IM) NAIL FEMORAL;  Surgeon: Jolyn Nap, MD;  Location: [place];  Service: Orthopedics;  Laterality: Left;  OPEN REDUCTION INTERNAL FIXATION LEFT PROXIMAL FEMUR Fracture     Scheduled Meds: . allopurinol  100 mg Oral q morning - 10a  . [START ON 09/27/2014] bisoprolol  2.5 mg Oral Daily  . budesonide-formoterol  2 puff Inhalation BID  . cefTAZidime (FORTAZ)  IV  1 g Intravenous Q24H  . cholecalciferol  1,000 Units Oral QHS  . cholecalciferol  2,000 Units Oral Daily  . docusate sodium  100 mg Oral BID  . feeding supplement (ENSURE ENLIVE)  237 mL Oral BID BM  . folic acid  1 mg Oral Daily  . montelukast  10  mg Oral QHS  . multivitamin with minerals  1 tablet Oral Daily  . pantoprazole  80 mg Oral QPM  . saccharomyces boulardii  250 mg Oral BID  . sodium chloride  3 mL Intravenous Q12H  . thiamine  100 mg Oral Daily   Continuous Infusions: . heparin 1,100 Units/hr (09/26/14 0518)   PRN Meds:.sodium chloride, albuterol, bisacodyl, levalbuterol, metoCLOPramide (REGLAN) injection, morphine injection, ondansetron **OR** ondansetron (ZOFRAN) IV, polyethylene glycol, sodium chloride, traMADol   Allergies:  Allergies  Allergen Reactions  . Baby Powder [Methylbenzethonium] Shortness Of Breath  . Demerol [Meperidine] Hives  . Fexofenadine Other (See Comments)    dizziness  . Penicillins Hives    Tolerates Ceftriaxone.  Marland Kitchen Spiriva Handihaler [Tiotropium Bromide Monohydrate]     Caused chest pains  Family history:     Family History  Problem Relation Age of Onset  . Hemochromatosis Sister   . Diabetes Sister   . Hypertension Sister   . Diabetes type II Mother   . Diabetes Mother   . Hypertension Mother   . Deep vein thrombosis Mother   . Hypertension Father   . Hypertension Brother   . Other Brother     Leg amputation-Gun shot  . Depression Brother   . Hemochromatosis Sister   . Heart attack Neg Hx   . Stroke Neg Hx   . Cancer Sister     Health maintenance:                 History  Substance Use Topics  . Smoking status: Former Smoker -- 1.00 packs/day for 20 years    Types: Cigarettes    Quit date: 09/01/1983  . Smokeless tobacco: Never Used  . Alcohol Use: No    ROS: Constitutional: Denies fevers, chills or abnormal night sweats Eyes: Denies blurriness of vision, double vision or watery eyes Ears, nose, mouth, throat, and face: Denies mucositis or sore throat Respiratory:  reports dyspnea  Due to ascites and CHF, COPD  Cardiovascular: Denies palpitation, chest discomfort, she reports lower extremity swelling Gastrointestinal:  Reports nausea and vomiting prior to  admission. Increased abdominal girth as above Skin: Denies abnormal skin rashes Lymphatics: Denies new lymphadenopathy or easy bruising Neurological:Denies numbness, tingling or new weaknesses Behavioral/Psych: Mood is stable, no new changes  All other systems were reviewed with the patient and are negative.    Physical Exam    Filed Vitals:   09/26/14 0540  BP: 84/43  Pulse: 95  Temp: 98 F (36.7 C)  Resp: 20   Filed Weights   09/23/14 0452 09/24/14 0638 09/26/14 0726  Weight: 160 lb (72.576 kg) 153 lb 8 oz (69.627 kg) 154 lb (69.854 kg)    GENERAL:alert, no distress and comfortable SKIN: skin color, texture, turgor are normal, no rashes or significant lesions EYES: normal, conjunctiva are pink and non-injected, sclera clear OROPHARYNX:no exudate, no erythema and lips, buccal mucosa, and tongue normal  NECK: supple, thyroid normal size, non-tender, without nodularity LYMPH:  no palpable lymphadenopathy in the cervical, axillary or inguinal area LUNGS: clear to auscultation and percussion with normal breathing effort HEART: irregular rate & rhythm and 2/6 systolic murmurs and no lower extremity edema ABDOMEN:abdomen soft, non-tender and normal bowel sounds Musculoskeletal:no cyanosis of digits and no clubbing  PSYCH: alert & oriented x 3 with fluent speech NEURO: no focal motor/sensory deficits   Lab results:       CBC  Recent Labs Lab 09/22/14 1654 09/22/14 2115 09/23/14 0441 09/24/14 1815 09/25/14 0350 09/26/14 0335  WBC 9.9 8.7 7.7 8.9 8.3 8.6  HGB 11.2* 10.8* 10.6* 11.1* 10.9* 10.7*  HCT 34.7* 33.8*  34.9 33.1* 36.1 35.2* 34.1*  PLT 274 266 281 268 250 249  MCV 89.2 89.7 89.5 90.5 88.9 90.2  MCH 28.8 28.6 28.6 27.8 27.5 28.3  MCHC 32.3 32.0 32.0 30.7 31.0 31.4  RDW 15.1 15.1 15.1 15.2 15.0 15.3  LYMPHSABS 0.8  --   --   --   --   --   MONOABS 0.6  --   --   --   --   --   EOSABS 0.0  --   --   --   --   --   BASOSABS 0.0  --   --   --   --   --  Anemia panel:   Recent Labs  09/25/14 0350  FERRITIN 69     Chemistries   Recent Labs Lab 09/22/14 1654 09/22/14 2115 09/23/14 0441 09/25/14 0350  NA 136  --  138 141  K 3.8  --  3.5 3.9  CL 100*  --  101 102  CO2 27  --  28 30  GLUCOSE 95  --  99 87  BUN 30*  --  30* 32*  CREATININE 2.04* 1.97* 1.93* 1.91*  CALCIUM 8.4*  --  8.1* 8.6*  MG  --   --  2.0  --      Coagulation profile  Recent Labs Lab 09/23/14 0441  INR 1.36    Urine Studies No results for input(s): UHGB, CRYS in the last 72 hours.  Invalid input(s): UACOL, UAPR, USPG, UPH, UTP, UGL, UKET, UBIL, UNIT, UROB, ULEU, UEPI, UWBC, URBC, UBAC, CAST, UCOM, BILUA  Studies:      Ct Abdomen Pelvis Wo Contrast  09/12/2014   CLINICAL DATA:  81 year old female with abdominal pain and distention.  EXAM: CT ABDOMEN AND PELVIS WITHOUT CONTRAST  TECHNIQUE: Multidetector CT imaging of the abdomen and pelvis was performed following the standard protocol without IV contrast.  COMPARISON:  11/03/2011 CT  FINDINGS: Please note that parenchymal abnormalities may be missed without intravenous contrast.  Lower chest: Cardiomegaly, tiny bilateral pleural effusions and bibasilar atelectasis identified.  Hepatobiliary: The liver and gallbladder are unremarkable. There is no evidence of biliary dilatation.  Pancreas: Unremarkable  Spleen: Unremarkable  Adrenals/Urinary Tract: Bilateral renal atrophy identified. The adrenal glands and bladder are unremarkable.  Stomach/Bowel: There is possible circumferential wall thickening of the distal stomach. Colonic diverticulosis is identified without evidence of diverticulitis. No other focal bowel wall thickening is identified.  Vascular/Lymphatic: Abdominal aortic atherosclerotic calcifications are noted. There is no evidence of abdominal aortic aneurysm. No enlarged lymph nodes are noted.  Reproductive: The uterus and adnexal regions are unremarkable.  Other: A large amount of ascites is  noted. Stranding in the omentum is present. No gross peritoneal nodules or masses are identified. There is no evidence of pneumoperitoneum.  Musculoskeletal: Surgical hardware within the proximal left femur is noted. Moderate -severe degenerative changes in the lumbar spine are identified. Grade 1 anterolisthesis of L4 on L5 is unchanged.  IMPRESSION: Large amount of ascites without identifiable cause. Omental stranding may represent edema but tumor involvement of the omentum is not entirely excluded. No adnexal masses identified.  Possible distal stomach wall thickening. Consider direct inspection as clinically indicated.  Bilateral renal atrophy.  Cardiomegaly, tiny bilateral pleural effusions and mild bibasilar atelectasis.   Electronically Signed   By: Margarette Canada M.D.   On: 09/12/2014 18:34   US Paracentesis  09/23/2014   INDICATION: Recurrent ascites, request for diagnostic and therapeutic paracentesis.  EXAM: ULTRASOUND-GUIDED PARACENTESIS  COMPARISON:  Paracentesis 09/18/14.  MEDICATIONS: None.  COMPLICATIONS: None immediate  TECHNIQUE: Informed written consent was obtained from the patient after a discussion of the risks, benefits and alternatives to treatment. A timeout was performed prior to the initiation of the procedure.  Initial ultrasound scanning demonstrates a moderate amount of ascites within the right lower abdominal quadrant. The right lower abdomen was prepped and draped in the usual sterile fashion. 1% lidocaine was used for local anesthesia.  Under direct ultrasound guidance, a 19 gauge, 10-cm, Yueh catheter was introduced. An ultrasound image was saved for documentation purposed. The paracentesis was performed. The catheter was removed and a dressing was applied. The patient  tolerated the procedure well without immediate post procedural complication.  FINDINGS: A total of approximately 3.1 liters of serous fluid was removed. Samples were sent to the laboratory as requested by the clinical  team.  IMPRESSION: Successful ultrasound-guided paracentesis yielding 3.1 liters of peritoneal fluid.  Read By:  Tsosie Billing PA-C   Electronically Signed   By: Corrie Mckusick D.O.   On: 09/23/2014 15:33   US Paracentesis  09/18/2014   INDICATION: Intra-abdominal ascites of uncertain etiology. Please perform ultrasound-guided paracentesis for diagnostic and therapeutic purposes.  EXAM: ULTRASOUND-GUIDED PARACENTESIS  COMPARISON:  None.  MEDICATIONS: 10 cc 1% lidocaine  COMPLICATIONS: None immediate  TECHNIQUE: Informed written consent was obtained from the patient after a discussion of the risks, benefits and alternatives to treatment. A timeout was performed prior to the initiation of the procedure.  Initial ultrasound scanning demonstrates a large amount of ascites within the right lower abdominal quadrant. The right lower abdomen was prepped and draped in the usual sterile fashion. 1% lidocaine with epinephrine was used for local anesthesia. Under direct ultrasound guidance, a 19 gauge, 7-cm, Yueh catheter was introduced. An ultrasound image was saved for documentation purposed. The paracentesis was performed. The catheter was removed and a dressing was applied. The patient tolerated the procedure well without immediate post procedural complication.  FINDINGS: A total of approximately 3.8 liters of yellow fluid was removed. Samples were sent to the laboratory as requested by the clinical team.  IMPRESSION: Successful ultrasound-guided paracentesis yielding 3.8 liters of peritoneal fluid.  50 gr IV albumin during procedure per MD.  Read by:  Lavonia Drafts [facility]   Electronically Signed   By: Sandi Mariscal M.D.   On: 09/18/2014 15:46   Dg Chest Port 1 View  09/23/2014   CLINICAL DATA:  Shortness of breath.  Chest congestion.  EXAM: PORTABLE CHEST - 1 VIEW  COMPARISON:  Chest x-rays dated 06/21/2014, 02/26/2014, 07/24/2013 and 07/06/2013 and 04/12/2012  FINDINGS: There is cardiomegaly with new pulmonary vascular  congestion and very slight accentuation of the interstitial markings. No discrete effusions. No acute osseous abnormality.  IMPRESSION: Pulmonary vascular congestion with minimal interstitial edema.   Electronically Signed   By: Lorriane Shire M.D.   On: 09/23/2014 14:30   Dg Abd Portable 1v  09/23/2014   CLINICAL DATA:  Abdominal distention and pain.  Ascites.  EXAM: PORTABLE ABDOMEN - 1 VIEW  COMPARISON:  CT scan dated 09/12/2014  FINDINGS: Bowel gas pattern is normal. Residual contrast in the appendix from the recent CT scan. Diffuse degenerative changes and scoliosis in the lumbar spine. Chronic degenerative or posttraumatic changes of the right pubic body. No acute osseous abnormality. Healed left hip fracture.  IMPRESSION: No acute abnormality of the abdomen.   Electronically Signed   By: Lorriane Shire M.D.   On: 09/23/2014 14:43    Assessment/Plan:81 y.o. female with  Malignant ascites, recurrent. She was admitted with increased abdominal girth, despite paracentesis on 7/14. She underwent paracentesis on 7/19 again Both pathologies ares suspicious for malignancy CT of the  Abdomen and pelvis on 7/8 prior to admission showed Large amount of ascites, omental stranding; no adnexal masses identified.  Possible distal stomach wall thickening. CA 125 is elevated, suggesting ovarian primary Agree with diagnostic laparoscopy for intraperitoneal tumor tissue diagnosis. An appointment at the [facility], likely by [place] can be arranged once discharged  DVT prophylaxis On heparin  Full Code  Other medical issues including CHF (EF 35%) and COPD, as per admitting  team  [place], PA-C 09/26/2014   Patient seen and examined please see the note above. Pleasant 81 year old woman currently lives independently in a senior living apartment with her daughter check in on her periodically. Hospitalized with recurrent ascites suggestive of a malignancy. CT scan on 09/12/2014 was reviewed  personally and showed omental stranding and adnexal masses.  On physical examination, awake alert woman not in any distress. I was regular rate and rhythm with clear lungs. Abdomen was soft and slightly distended with shifting dullness. Extremities with no edema.  Impression: 81 year old woman presenting with malignant ascites likely of GYN etiology possibly from a GI etiology.  At this time and feel that we need clear-cut diagnosis with viable tissue. This could be done with a percutaneous biopsy versus laparoscopic procedure.  Given the high CEA 125 and the presentation suggestive of a GYN malignancy, she would benefit from consultation with GYN oncology.  Her workup could be completed as an outpatient if there is no reason for her to stay inpatient. We'll arrange follow-up for her upon discharge.  Once tissue diagnosis and been established, discussion regarding treatment options can take place. This could include intravenous chemotherapy, oral agents versus supportive care only.  [facility] MD 09/26/2014

## 2014-09-26 NOTE — Progress Notes (Signed)
No vomiting past 24 hours, but is having minimal food intake.  No complaint of abdominal pain, but is having abdominal distention.  Repeat paracentesis cytology is again suspicious for malignancy, but inconclusive.  Recommend:  1. Consider oncology consultation, either formal or curbside, to get direction as to how to definitively exclude or confirm the presence of malignant ascites.  2. Could consider diagnostic laparoscopic evaluation which would almost certainly find intraperitoneal tumor studding if it is present to account for the possible malignant ascites.  However, the oncologist might have a better way to accomplish that objective.  Cleotis Nipper, M.D. Pager [phone] If no answer or after 5 PM call [phone]

## 2014-09-26 NOTE — Progress Notes (Signed)
[facility] PROGRESS NOTE  [name] [name] HKV:[id number] DOB: December 06, 1923 DOA: 09/22/2014 PCP: [name]  Brief narrative: 09/24/2014: I have seen and examined [name] [name] at bedside and reviewed her chart including off service note by Dr. Wynelle Cleveland. [facility]. [name] [name] is a pleasant 81 y.o. female with CAD, HTN, HLD, COPD, carotid artery disease s/p bilateral CEA, atrial fibrillation and chronic diastolic CHF, ascites (unknown cause), who presented with increased abdominal distension, now status post paracentesis twice with concern for malignant ascites, final cytology results pending. She is on treatment for presumed SBP although fluid cultures have remained negative. Patient also had 2-D echocardiogram which showed a decrease in ejection fraction from 50% in 2015 to 30-35% this time around. She complains of pleuritic chest pain, and it is possible that she has healthcare associated pneumonia given she was hospitalized a couple of months ago. Will therefore change antibiotics to vancomycin/cefepime to cover for nosocomial organisms, consult cardiology regarding low ejection fraction, ?further workup and follow GI recommendations. Otherwise she complains of cough and abdominal distention but leg edema has resolved. 09/25/2014: Appreciate GI/cardiology. CA-125 elevated. More concern for ovarian CA. Shared these concerns with patient and her daughter. Await final pathology results. Meanwhile, will continue current antibiotic and follow GI/cardiology recommendations. 09/26/2014: Septic workup remains negative. Discussed with Dr. Gerilyn Nestle. [place]. Appreciate help. Patient likely has malignant ascites from ovarian CA. Will consult oncology for help with further diagnostic pathway, discontinue vancomycin and follow GI/cardiology recommendations. Patient on raloxifene, not sure of indication. Will discontinue for now given cardiac issues. Assessment/Plan: Principal  Problem: Ascites, presumed malignant with question of SBP (spontaneous bacterial peritonitis) at admission - paracentesis on 7/14 as outpt -Discontinue Vancomycin -Consult oncology regarding further workup   Acute Systolic CHF- diastolic CHF?- mod AS. Borderline low BP - EF found to be 30-35% today- last ECHO last yr- EF was 50 % and had mild pulm HTN - she does not have significant right heart failure on ECHO - Has mild pulm edema on CXR, possible pneumonia - Follow cardiology recommendations.   Atrial fibrillation - Rate controlled. Still on heparin drip as may need more surgical intervention. Appreciate pharmacy.   CKD (chronic kidney disease), stage 3- 4 - follow  COPD - cont Symbicort, Xopenex, Singulair.  - Discontinued vancomycin, continue cefepime  Appt with PCP: requested Code Status: full code Family Communication:  Disposition Plan: f/u on ascitic fluid path DVT prophylaxis: Heparin Consultants:GI/cardiology Procedures: Paracentesis Antibiotics:  Vancomycin 09/24/2014> 09/25/14  Cefepime 09/24/2014  HPI/Subjective: Feels better today, less pain.  Objective: Filed Vitals:   09/26/14 0540  BP: 84/43  Pulse: 95  Temp: 98 F (36.7 C)  Resp: 20    Intake/Output Summary (Last 24 hours) at 09/26/14 1057 Last data filed at 09/26/14 0550  Gross per 24 hour  Intake   1192 ml  Output    600 ml  Net    592 ml   Filed Weights   09/23/14 0452 09/24/14 0638 09/26/14 0726  Weight: 72.576 kg (160 lb) 69.627 kg (153 lb 8 oz) 69.854 kg (154 lb)    Exam:   General:  Comfortable at rest.  Cardiovascular: S1-S2 normal. No murmurs. Pulse regular.  Respiratory: Good air entry bilaterally. No rhonchi or rales.  Abdomen: Soft and nontender. Normal bowel sounds. No organomegaly.  Musculoskeletal: No pedal edema   Neurological: Intact  Data Reviewed: Basic Metabolic Panel:  Recent Labs Lab 09/22/14 1654 09/22/14 2115 09/23/14 0441 09/25/14 0350   NA 136  --  138 141  K 3.8  --  3.5 3.9  CL 100*  --  101 102  CO2 27  --  28 30  GLUCOSE 95  --  99 87  BUN 30*  --  30* 32*  CREATININE 2.04* 1.97* 1.93* 1.91*  CALCIUM 8.4*  --  8.1* 8.6*  MG  --   --  2.0  --   PHOS  --   --  3.6  --    Liver Function Tests:  Recent Labs Lab 09/22/14 1654 09/23/14 0441  AST 24 20  ALT 17 14  ALKPHOS 64 70  BILITOT 0.5 0.4  PROT 5.9* 5.4*  ALBUMIN 2.5* 2.3*    Recent Labs Lab 09/22/14 1654  LIPASE 29   No results for input(s): AMMONIA in the last 168 hours. CBC:  Recent Labs Lab 09/22/14 1654 09/22/14 2115 09/23/14 0441 09/24/14 1815 09/25/14 0350 09/26/14 0335  WBC 9.9 8.7 7.7 8.9 8.3 8.6  NEUTROABS 8.6*  --   --   --   --   --   HGB 11.2* 10.8* 10.6* 11.1* 10.9* 10.7*  HCT 34.7* 33.8*  34.9 33.1* 36.1 35.2* 34.1*  MCV 89.2 89.7 89.5 90.5 88.9 90.2  PLT 274 266 281 268 250 249   Cardiac Enzymes: No results for input(s): CKTOTAL, CKMB, CKMBINDEX, TROPONINI in the last 168 hours. BNP (last 3 results)  Recent Labs  02/26/14 1550 09/12/14 1448 09/22/14 1701  BNP 292.3* 360.2* 165.9*    ProBNP (last 3 results) No results for input(s): PROBNP in the last 8760 hours.  CBG:  Recent Labs Lab 09/23/14 0817 09/24/14 0745 09/25/14 0808 09/26/14 0754  GLUCAP 101* 78 125* 90    Recent Results (from the past 240 hour(s))  Culture, body fluid-bottle     Status: None   Collection Time: 09/18/14 12:33 PM  Result Value Ref Range Status   Specimen Description FLUID ABDOMEN PERITONEAL  Final   Special Requests BAA 5MLS  Final   Culture NO GROWTH 5 DAYS  Final   Report Status 09/23/2014 FINAL  Final  Gram stain     Status: None   Collection Time: 09/18/14 12:33 PM  Result Value Ref Range Status   Specimen Description FLUID ABDOMEN PERITONEAL  Final   Special Requests NONE  Final   Gram Stain   Final    MODERATE WBC PRESENT, PREDOMINANTLY PMN NO ORGANISMS SEEN    Report Status 09/19/2014 FINAL  Final  AFB  culture with smear     Status: None (Preliminary result)   Collection Time: 09/23/14 10:39 AM  Result Value Ref Range Status   Specimen Description ABDOMEN  Final   Special Requests NONE  Final   Acid Fast Smear   Final    NO ACID FAST BACILLI SEEN Performed at [facility]    Culture   Final    CULTURE WILL BE EXAMINED FOR 6 WEEKS BEFORE ISSUING A FINAL REPORT Performed at [facility]    Report Status PENDING  Incomplete  Culture, body fluid-bottle     Status: None (Preliminary result)   Collection Time: 09/23/14 10:39 AM  Result Value Ref Range Status   Specimen Description FLUID ABDOMEN PERITONEAL  Final   Special Requests NONE  Final   Culture NO GROWTH 2 DAYS  Final   Report Status PENDING  Incomplete  Gram stain     Status: None   Collection Time: 09/23/14 10:39 AM  Result Value Ref Range Status   Specimen Description FLUID  ABDOMEN PERITONEAL  Final   Special Requests NONE  Final   Gram Stain   Final    ABUNDANT WBC PRESENT,BOTH PMN AND MONONUCLEAR NO ORGANISMS SEEN    Report Status 09/23/2014 FINAL  Final     Studies: No results found.  Scheduled Meds: . allopurinol  100 mg Oral q morning - 10a  . [START ON 09/27/2014] bisoprolol  2.5 mg Oral Daily  . budesonide-formoterol  2 puff Inhalation BID  . cefTAZidime (FORTAZ)  IV  1 g Intravenous Q24H  . cholecalciferol  1,000 Units Oral QHS  . cholecalciferol  2,000 Units Oral Daily  . docusate sodium  100 mg Oral BID  . feeding supplement (ENSURE ENLIVE)  237 mL Oral BID BM  . folic acid  1 mg Oral Daily  . montelukast  10 mg Oral QHS  . multivitamin with minerals  1 tablet Oral Daily  . pantoprazole  80 mg Oral QPM  . raloxifene  60 mg Oral q morning - 10a  . saccharomyces boulardii  250 mg Oral BID  . sodium chloride  3 mL Intravenous Q12H  . thiamine  100 mg Oral Daily   Continuous Infusions: . heparin 1,100 Units/hr (09/26/14 0518)     Time spent: 25 minutes    Stacie Templin  [facility] Pager [phone]. If 7PM-7AM, please contact night-coverage at [email or web address], password [facility] 09/26/2014, 10:57 AM  LOS: 4 days

## 2014-09-26 NOTE — Progress Notes (Signed)
Patient with SBP < 90. But has been that way all day. Paged NP Baltazar Najjar. No new orders received. Patient denies dizziness nor chest pain. Patient does report getting "dizzy" when getting oob at times. Will monitor.

## 2014-09-26 NOTE — Progress Notes (Signed)
ANTICOAGULATION CONSULT NOTE - Follow Up Consult  Pharmacy Consult for Heparin Indication: atrial fibrillation  Allergies  Allergen Reactions  . Baby Powder [Methylbenzethonium] Shortness Of Breath  . Demerol [Meperidine] Hives  . Fexofenadine Other (See Comments)    dizziness  . Penicillins Hives    Tolerates Ceftriaxone.  Marland Kitchen Spiriva Handihaler [Tiotropium Bromide Monohydrate]     Caused chest pains    Patient Measurements: Height: 5\' 1"  (154.9 cm) Weight: 153 lb 8 oz (69.627 kg) IBW/kg (Calculated) : 47.8 Heparin Dosing Weight:   Vital Signs: Temp: 98.7 F (37.1 C) (07/21 2035) Temp Source: Oral (07/21 2035) BP: 82/42 mmHg (07/21 2225) Pulse Rate: 53 (07/21 2035)  Labs:  Recent Labs  09/24/14 1815 09/25/14 0350 09/25/14 1330 09/26/14 0335  HGB 11.1* 10.9*  --  10.7*  HCT 36.1 35.2*  --  34.1*  PLT 268 250  --  249  APTT 51* 85* 75* 65*  HEPARINUNFRC 1.36* 1.20* 1.10* 0.69  CREATININE  --  1.91*  --   --     Estimated Creatinine Clearance: 18.2 mL/min (by C-G formula based on Cr of 1.91).   Medications:  Infusions:  . heparin      Assessment: Patient with heparin level at goal but PTT just below goal.  No heparin issues per RN.  Goal of Therapy:  Heparin level 0.3-0.7 units/ml aPTT 66-102 seconds Monitor platelets by anticoagulation protocol: Yes   Plan:  Continue heparin drip at current rate Recheck level at 1300.  Tyler Deis, Shea Stakes [place] 09/26/2014,5:28 AM

## 2014-09-26 NOTE — Progress Notes (Signed)
Pt's ferritin is normal; despite a strong family hx of hemachromatosis, she doesn't have that condition herself.  This is relevant since hemachromatosis would have been a possible cause for cardiomyopathy or liver disease (she appears NOT to have liver disease, but with the ascites, the absence of hemachromatosis is further reassurance relative to her liver status.)  Cleotis Nipper, M.D. Pager [phone] If no answer or after 5 PM call [phone]

## 2014-09-26 NOTE — Progress Notes (Signed)
ANTICOAGULATION CONSULT NOTE - Follow Up Consult  Pharmacy Consult for Heparin Indication: atrial fibrillation  Allergies  Allergen Reactions  . Baby Powder [Methylbenzethonium] Shortness Of Breath  . Demerol [Meperidine] Hives  . Fexofenadine Other (See Comments)    dizziness  . Penicillins Hives    Tolerates Ceftriaxone.  Marland Kitchen Spiriva Handihaler [Tiotropium Bromide Monohydrate]     Caused chest pains    Patient Measurements: Height: 5\' 1"  (154.9 cm) Weight: 154 lb (69.854 kg) IBW/kg (Calculated) : 47.8 Heparin Dosing Weight:   Vital Signs: Temp: 98.3 F (36.8 C) (07/22 1322) Temp Source: Oral (07/22 1322) BP: 96/63 mmHg (07/22 1322) Pulse Rate: 78 (07/22 1322)  Labs:  Recent Labs  09/24/14 1815 09/25/14 0350 09/25/14 1330 09/26/14 0335 09/26/14 1405  HGB 11.1* 10.9*  --  10.7*  --   HCT 36.1 35.2*  --  34.1*  --   PLT 268 250  --  249  --   APTT 51* 85* 75* 65* 46*  HEPARINUNFRC 1.36* 1.20* 1.10* 0.69 0.63  CREATININE  --  1.91*  --   --   --     Estimated Creatinine Clearance: 18.2 mL/min (by C-G formula based on Cr of 1.91).   Medications:  Infusions:  . heparin 1,100 Units/hr (09/26/14 0518)    Assessment: 89 yoF on apixaban PTA for hx PAF, CAD s/p bilateral CEA, chronic diastolic CHF (EF 40-34%), and ascites admitted on 7/18 for SBP and vomiting. Anticoagulation placed on hold for paracentesis on 7/19. Pharmacy consulted to start IV heparin after paracentesis completed in case patient required further procedures.   Poor renal function: CrCl ~18 ml/min Last dose apixaban 2.5mg  on 7/18 @ 0930 CBC: Hgb a little low but relatively stable, plts WNL  Heparin levels have been elevated however likely influenced by apixaban.  Therefore will monitor aPTTs until these levels correlate with heparin levels (and apixaban finally cleared, delayed with poor renal fxn)   7/22: Heparin infusion at 1100 units/hr = 11 ml/hr.  No issues noted per RN.   APTT trending  down, heparin level now therapeutic.    Goal of Therapy:  Heparin level 0.3-0.7 units/ml aPTT 66-102 seconds Monitor platelets by anticoagulation protocol: Yes   Plan:  Continue heparin drip at current rate Recheck HL and aPTT in 8 hours to confirm therapeutic heparin level    Ralene Bathe, PharmD, BCPS 09/26/2014, 2:31 PM  Pager: [phone]

## 2014-09-26 NOTE — Clinical Social Work Note (Signed)
Clinical Social Work Assessment  Patient Details  Name: [name] [name] MRN: [id number] Date of Birth: [birth year]  Date of referral:  09/26/14               Reason for consult:  Discharge Planning                Permission sought to share information with:  Family Supports Permission granted to share information::  Yes, Verbal Permission Granted  Name::     [name]  Agency::     Relationship::  daughter  Contact Information:  [phone]  Housing/Transportation Living arrangements for the past 2 months:  Apartment Source of Information:    Patient Interpreter Needed:  None Criminal Activity/Legal Involvement Pertinent to Current Situation/Hospitalization:  No - Comment as needed Significant Relationships:  Adult Children Lives with:  Self Do you feel safe going back to the place where you live?  No Need for family participation in patient care:  Yes (Comment) (pt daughter at bedside)  Care giving concerns:  Pt lives alone. PT recommending SNF.   Social Worker assessment / plan:  CSW received referral for New SNF.   CSW met with pt and pt daughter, [name] at bedside. CSW introduced self and explained role. Pt and pt daughter confirmed that pt lives alone in a senior living apartment complex. CSW discussed recommendation for short term rehab at [facility] following hospitalization. Pt initially hesitant, but with encouragement from pt daughter, pt agreeable to rehab at [facility]. Pt daughter reports that pt has been to [facility] in the past and was satisfied with facility. CSW discussed process of SNF search and provided list.  CSW completed FL2 and initiated SNF search to [facility].  CSW to follow up with SNF bed offers.  CSW to continue to follow to provide support and assist with pt disposition needs.   Employment status:  Retired Nurse, adult PT Recommendations:  [place] / Referral to community resources:  [place]  Patient/Family's Response to care:  Pt alert and oriented x 4. Pt hopeful for very short time in rehab and pt daughter is willing to allow pt to stay with her following rehab, but feels that rehab at  SNF is needed following hospitalization.  Patient/Family's Understanding of and Emotional Response to Diagnosis, Current Treatment, and Prognosis:  Pt daughter discussed that oncology has seen pt, but oncology work up will completed outpatient. Pt daughter reports that they are awaiting results from some other testing at this time.   Emotional Assessment Appearance:  Appears stated age Attitude/Demeanor/Rapport:  Other (appropriate) Affect (typically observed):  Pleasant Orientation:  Oriented to Self, Oriented to Place, Oriented to  Time, Oriented to Situation Alcohol / Substance use:  Not Applicable Psych involvement (Current and /or in the community):  No (Comment)  Discharge Needs  Concerns to be addressed:  Discharge Planning Concerns Readmission within the last 30 days:  No Current discharge risk:  Lives alone Barriers to Discharge:  Continued Medical Work up   Ladell Pier, LCSW 09/26/2014, 8:40 PM [phone]

## 2014-09-26 NOTE — Clinical Social Work Placement (Signed)
   CLINICAL SOCIAL WORK PLACEMENT  NOTE  Date:  09/26/2014  Patient Details  Name: [name] [name] MRN: [id number] Date of Birth: 1923/04/24  Clinical Social Work is seeking post-discharge placement for this patient at the [place] level of care (*CSW will initial, date and re-position this form in  chart as items are completed):  Yes   Patient/family provided with [place] Work Department's list of facilities offering this level of care within the geographic area requested by the patient (or if unable, by the patient's family).  Yes   Patient/family informed of their freedom to choose among providers that offer the needed level of care, that participate in Medicare, Medicaid or managed care program needed by the patient, have an available bed and are willing to accept the patient.  Yes   Patient/family informed of [facility]'s ownership interest in [facility] and [facility], as well as of the fact that they are under no obligation to receive care at these facilities.  PASRR submitted to EDS on 09/26/14     PASRR number received on 09/26/14     Existing PASRR number confirmed on       FL2 transmitted to all facilities in geographic area requested by pt/family on 09/26/14     FL2 transmitted to all facilities within larger geographic area on       Patient informed that his/her managed care company has contracts with or will negotiate with certain facilities, including the following:            Patient/family informed of bed offers received.  Patient chooses bed at       Physician recommends and patient chooses bed at      Patient to be transferred to   on  .  Patient to be transferred to facility by       Patient family notified on   of transfer.  Name of family member notified:        PHYSICIAN Please sign FL2     Additional Comment:  Excluded [facility] from SNF search upon pt and pt daughter  request.  _______________________________________________ Ladell Pier, LCSW 09/26/2014, 8:45 PM

## 2014-09-26 NOTE — Care Management Important Message (Signed)
Important Message  Patient Details  Name: Tyffany Waldrop MRN: [id number] Date of Birth: Feb 09, 1924   Medicare Important Message Given:  Yes-third notification given    Shelda Altes 09/26/2014, 1:39 [place] Message  Patient Details  Name: Briannon Boggio MRN: [id number] Date of Birth: 11-Mar-1923   Medicare Important Message Given:  Yes-third notification given    Shelda Altes 09/26/2014, 1:39 PM

## 2014-09-26 NOTE — Progress Notes (Signed)
Spoke to pharmacist about heparin level this am.

## 2014-09-27 DIAGNOSIS — K652 Spontaneous bacterial peritonitis: Secondary | ICD-10-CM

## 2014-09-27 LAB — CBC
HCT: 33 % — ABNORMAL LOW (ref 36.0–46.0)
Hemoglobin: 10.4 g/dL — ABNORMAL LOW (ref 12.0–15.0)
MCH: 27.9 pg (ref 26.0–34.0)
MCHC: 31.5 g/dL (ref 30.0–36.0)
MCV: 88.5 fL (ref 78.0–100.0)
PLATELETS: 244 10*3/uL (ref 150–400)
RBC: 3.73 MIL/uL — AB (ref 3.87–5.11)
RDW: 15.1 % (ref 11.5–15.5)
WBC: 8.3 10*3/uL (ref 4.0–10.5)

## 2014-09-27 LAB — BASIC METABOLIC PANEL
Anion gap: 7 (ref 5–15)
BUN: 42 mg/dL — ABNORMAL HIGH (ref 6–20)
CALCIUM: 8.7 mg/dL — AB (ref 8.9–10.3)
CO2: 28 mmol/L (ref 22–32)
CREATININE: 2.46 mg/dL — AB (ref 0.44–1.00)
Chloride: 98 mmol/L — ABNORMAL LOW (ref 101–111)
GFR calc Af Amer: 19 mL/min — ABNORMAL LOW (ref >60)
GFR, EST NON AFRICAN AMERICAN: 16 mL/min — AB (ref >60)
Glucose, Bld: 102 mg/dL — ABNORMAL HIGH (ref 65–99)
Potassium: 4.4 mmol/L (ref 3.5–5.1)
Sodium: 133 mmol/L — ABNORMAL LOW (ref 135–145)

## 2014-09-27 LAB — GLUCOSE, CAPILLARY: Glucose-Capillary: 90 mg/dL (ref 65–99)

## 2014-09-27 LAB — HEPARIN LEVEL (UNFRACTIONATED)
HEPARIN UNFRACTIONATED: 0.45 [IU]/mL (ref 0.30–0.70)
HEPARIN UNFRACTIONATED: 0.39 [IU]/mL (ref 0.30–0.70)

## 2014-09-27 LAB — APTT: aPTT: 72 seconds — ABNORMAL HIGH (ref 24–37)

## 2014-09-27 MED ORDER — SODIUM CHLORIDE 0.45 % IV BOLUS
250.0000 mL | Freq: Once | INTRAVENOUS | Status: AC
Start: 2014-09-27 — End: 2014-09-28
  Administered 2014-09-27: 250 mL via INTRAVENOUS

## 2014-09-27 MED ORDER — BISOPROLOL FUMARATE 5 MG PO TABS
2.5000 mg | ORAL_TABLET | Freq: Every day | ORAL | Status: DC
  Administered 2014-09-28 – 2014-10-02 (×2): 2.5 mg via ORAL
  Filled 2014-09-27 (×5): qty 0.5

## 2014-09-27 MED ORDER — SODIUM CHLORIDE 0.45 % IV SOLN
INTRAVENOUS | Status: AC
  Administered 2014-09-27: 13:00:00 via INTRAVENOUS
  Administered 2014-09-28: 1000 mL via INTRAVENOUS

## 2014-09-27 MED ORDER — SODIUM CHLORIDE 0.9 % IV BOLUS (SEPSIS)
250.0000 mL | Freq: Once | INTRAVENOUS | Status: AC
  Administered 2014-09-27: 250 mL via INTRAVENOUS

## 2014-09-27 NOTE — Progress Notes (Signed)
Patient Name: [name] [name] Date of Encounter: 09/27/2014  Primary Cardiologist Darlina Guys MD   Principal Problem:   SBP (spontaneous bacterial peritonitis) Active Problems:   Hyperlipidemia   Atrial fibrillation   Chronic combined systolic and diastolic heart failure   Hypertension   CKD (chronic kidney disease), stage III   CHF exacerbation   Ascites   Acute on chronic combined systolic and diastolic congestive heart failure    SUBJECTIVE  Denies any discomfort. Says her LE edema has significantly improved, however her abdomen still distended. Sitting in bed, appears to be comfortable. Denies any dizziness.   CURRENT MEDS . allopurinol  100 mg Oral q morning - 10a  . bisoprolol  2.5 mg Oral Daily  . budesonide-formoterol  2 puff Inhalation BID  . cholecalciferol  1,000 Units Oral QHS  . cholecalciferol  2,000 Units Oral Daily  . docusate sodium  100 mg Oral BID  . feeding supplement (ENSURE ENLIVE)  237 mL Oral BID BM  . folic acid  1 mg Oral Daily  . montelukast  10 mg Oral QHS  . multivitamin with minerals  1 tablet Oral Daily  . pantoprazole  80 mg Oral QPM  . saccharomyces boulardii  250 mg Oral BID  . sodium chloride  250 mL Intravenous Once  . sodium chloride  3 mL Intravenous Q12H  . thiamine  100 mg Oral Daily    OBJECTIVE  Filed Vitals:   09/26/14 2210 09/26/14 2255 09/27/14 0259 09/27/14 0633  BP: 80/41 92/42  87/56  Pulse: 73   92  Temp: 98.3 F (36.8 C)  98 F (36.7 C) 97.8 F (36.6 C)  TempSrc: Oral  Oral Oral  Resp: 16   20  Height:      Weight:      SpO2: 99%   99%    Intake/Output Summary (Last 24 hours) at 09/27/14 0759 Last data filed at 09/27/14 0634  Gross per 24 hour  Intake   1171 ml  Output    450 ml  Net    721 ml   Filed Weights   09/23/14 0452 09/24/14 0638 09/26/14 0726  Weight: 72.576 kg (160 lb) 69.627 kg (153 lb 8 oz) 69.854 kg (154 lb)    PHYSICAL EXAM  General: Pleasant, NAD. Neuro: Alert and oriented X 3.  Moves all extremities spontaneously. Psych: Normal affect. HEENT:  Normal  Neck: Supple without bruits or JVD. Lungs:  Resp regular and unlabored, CTA. Heart: irregular. no s3, s4, or murmurs. Abdomen: Soft, non-tender, BS + x 4. Moderately distended Extremities: No clubbing, cyanosis or edema. DP/PT/Radials 2+ and equal bilaterally.  Accessory Clinical Findings  CBC  Recent Labs  09/26/14 0335 09/27/14 0502  WBC 8.6 8.3  HGB 10.7* 10.4*  HCT 34.1* 33.0*  MCV 90.2 88.5  PLT 249 902   Basic Metabolic Panel  Recent Labs  09/25/14 0350 09/27/14 0502  NA 141 133*  K 3.9 4.4  CL 102 98*  CO2 30 28  GLUCOSE 87 102*  BUN 32* 42*  CREATININE 1.91* 2.46*  CALCIUM 8.6* 8.7*    TELE Not on telemetry    ECG  No new EKG  Echocardiogram 09/23/2014  LV EF: 30% -  35%  ------------------------------------------------------------------- Indications:   CHF - 428.0.  ------------------------------------------------------------------- History:  PMH: Carotid Stenosis. Lower Extremity Edema. Dyspnea. Atrial fibrillation. Coronary artery disease. Chronic obstructive pulmonary disease. Risk factors: Hypertension. Dyslipidemia.  ------------------------------------------------------------------- Study Conclusions  - Left ventricle: The cavity size was mildly  dilated. Wall thickness was normal. Systolic function was moderately to severely reduced. The estimated ejection fraction was in the range of 30% to 35%. Diffuse hypokinesis. Doppler parameters are consistent with high ventricular filling pressure. - Aortic valve: Valve mobility was restricted. There was moderate stenosis. Valve area (VTI): 1.36 cm^2. Valve area (Vmax): 1.31 cm^2. Valve area (Vmean): 1.26 cm^2. - Mitral valve: Calcified annulus. There was mild regurgitation. - Left atrium: The atrium was severely dilated. - Right ventricle: Systolic function was mildly reduced. - Pulmonary  arteries: Systolic pressure was mildly increased. - Pericardium, extracardiac: A trivial pericardial effusion was identified. There was a left pleural effusion.  Impressions:  - Moderate to severe global reduction in LV function; mild LVE; severe LAE; mildly reduced RV function; calcified aortic valve with reduced cusp excursion; moderate AS by continuity equation; mild MR and TR; mildly elevated pulmonary pressure. Compared to 09/26/13, LV function is worse and aortic stenosis may be mildly worse.     Radiology/Studies  Ct Abdomen Pelvis Wo Contrast  09/12/2014   CLINICAL DATA:  81 year old female with abdominal pain and distention.  EXAM: CT ABDOMEN AND PELVIS WITHOUT CONTRAST  TECHNIQUE: Multidetector CT imaging of the abdomen and pelvis was performed following the standard protocol without IV contrast.  COMPARISON:  11/03/2011 CT  FINDINGS: Please note that parenchymal abnormalities may be missed without intravenous contrast.  Lower chest: Cardiomegaly, tiny bilateral pleural effusions and bibasilar atelectasis identified.  Hepatobiliary: The liver and gallbladder are unremarkable. There is no evidence of biliary dilatation.  Pancreas: Unremarkable  Spleen: Unremarkable  Adrenals/Urinary Tract: Bilateral renal atrophy identified. The adrenal glands and bladder are unremarkable.  Stomach/Bowel: There is possible circumferential wall thickening of the distal stomach. Colonic diverticulosis is identified without evidence of diverticulitis. No other focal bowel wall thickening is identified.  Vascular/Lymphatic: Abdominal aortic atherosclerotic calcifications are noted. There is no evidence of abdominal aortic aneurysm. No enlarged lymph nodes are noted.  Reproductive: The uterus and adnexal regions are unremarkable.  Other: A large amount of ascites is noted. Stranding in the omentum is present. No gross peritoneal nodules or masses are identified. There is no evidence of  pneumoperitoneum.  Musculoskeletal: Surgical hardware within the proximal left femur is noted. Moderate -severe degenerative changes in the lumbar spine are identified. Grade 1 anterolisthesis of L4 on L5 is unchanged.  IMPRESSION: Large amount of ascites without identifiable cause. Omental stranding may represent edema but tumor involvement of the omentum is not entirely excluded. No adnexal masses identified.  Possible distal stomach wall thickening. Consider direct inspection as clinically indicated.  Bilateral renal atrophy.  Cardiomegaly, tiny bilateral pleural effusions and mild bibasilar atelectasis.   Electronically Signed   By: Margarette Canada M.D.   On: 09/12/2014 18:34   US Paracentesis  09/23/2014   INDICATION: Recurrent ascites, request for diagnostic and therapeutic paracentesis.  EXAM: ULTRASOUND-GUIDED PARACENTESIS  COMPARISON:  Paracentesis 09/18/14.  MEDICATIONS: None.  COMPLICATIONS: None immediate  TECHNIQUE: Informed written consent was obtained from the patient after a discussion of the risks, benefits and alternatives to treatment. A timeout was performed prior to the initiation of the procedure.  Initial ultrasound scanning demonstrates a moderate amount of ascites within the right lower abdominal quadrant. The right lower abdomen was prepped and draped in the usual sterile fashion. 1% lidocaine was used for local anesthesia.  Under direct ultrasound guidance, a 19 gauge, 10-cm, Yueh catheter was introduced. An ultrasound image was saved for documentation purposed. The paracentesis was performed. The catheter was removed  and a dressing was applied. The patient tolerated the procedure well without immediate post procedural complication.  FINDINGS: A total of approximately 3.1 liters of serous fluid was removed. Samples were sent to the laboratory as requested by the clinical team.  IMPRESSION: Successful ultrasound-guided paracentesis yielding 3.1 liters of peritoneal fluid.  Read By:  Tsosie Billing PA-C   Electronically Signed   By: Corrie Mckusick D.O.   On: 09/23/2014 15:33   US Paracentesis  09/18/2014   INDICATION: Intra-abdominal ascites of uncertain etiology. Please perform ultrasound-guided paracentesis for diagnostic and therapeutic purposes.  EXAM: ULTRASOUND-GUIDED PARACENTESIS  COMPARISON:  None.  MEDICATIONS: 10 cc 1% lidocaine  COMPLICATIONS: None immediate  TECHNIQUE: Informed written consent was obtained from the patient after a discussion of the risks, benefits and alternatives to treatment. A timeout was performed prior to the initiation of the procedure.  Initial ultrasound scanning demonstrates a large amount of ascites within the right lower abdominal quadrant. The right lower abdomen was prepped and draped in the usual sterile fashion. 1% lidocaine with epinephrine was used for local anesthesia. Under direct ultrasound guidance, a 19 gauge, 7-cm, Yueh catheter was introduced. An ultrasound image was saved for documentation purposed. The paracentesis was performed. The catheter was removed and a dressing was applied. The patient tolerated the procedure well without immediate post procedural complication.  FINDINGS: A total of approximately 3.8 liters of yellow fluid was removed. Samples were sent to the laboratory as requested by the clinical team.  IMPRESSION: Successful ultrasound-guided paracentesis yielding 3.8 liters of peritoneal fluid.  50 gr IV albumin during procedure per MD.  Read by:  Lavonia Drafts [facility]   Electronically Signed   By: Sandi Mariscal M.D.   On: 09/18/2014 15:46   Dg Chest Port 1 View  09/23/2014   CLINICAL DATA:  Shortness of breath.  Chest congestion.  EXAM: PORTABLE CHEST - 1 VIEW  COMPARISON:  Chest x-rays dated 06/21/2014, 02/26/2014, 07/24/2013 and 07/06/2013 and 04/12/2012  FINDINGS: There is cardiomegaly with new pulmonary vascular congestion and very slight accentuation of the interstitial markings. No discrete effusions. No acute osseous  abnormality.  IMPRESSION: Pulmonary vascular congestion with minimal interstitial edema.   Electronically Signed   By: Lorriane Shire M.D.   On: 09/23/2014 14:30   Dg Abd Portable 1v  09/23/2014   CLINICAL DATA:  Abdominal distention and pain.  Ascites.  EXAM: PORTABLE ABDOMEN - 1 VIEW  COMPARISON:  CT scan dated 09/12/2014  FINDINGS: Bowel gas pattern is normal. Residual contrast in the appendix from the recent CT scan. Diffuse degenerative changes and scoliosis in the lumbar spine. Chronic degenerative or posttraumatic changes of the right pubic body. No acute osseous abnormality. Healed left hip fracture.  IMPRESSION: No acute abnormality of the abdomen.   Electronically Signed   By: Lorriane Shire M.D.   On: 09/23/2014 14:43    ASSESSMENT AND PLAN  1. Chronic systolic CHF.   - Ecgho 0/93/8182 EF 30-35%, moderate AS, mild MR, severe LAE. Mildly reduced RV EF  - Limited response to diuretic but need to be careful with too much diuresis with recent large volume paracentesis. I don't think her ascites is explained by right heart failure. Right heart function looks pretty good by Echo and pulmonary pressures are only mildly increased.   - She is not a candidate for ACEi/ARB due to CKD.   - on bisoprolol 2.5 mg a day   - Plan to resume anticoagulation with Eliquis once invasive procedures  no longer needed. For now on IV heparin.  - appears to be euvolemic on exam. No need for lasix at this point, esp since BP low.   2. Ascites. Likely due to ovarian cancer . She may need to have laparoscopic procedures. She is not in CHF currently.  Able to lie flat without any difficulties. She should be at low risk for procedures / operations .   3. Persistent atrial fibrillation.   - rate control only with beta blocker. Amiodarone discontinued. Restart eliquis once she is through with procedures.   4. CKD stage 3-4 5. Mild to moderate aortic stenosis.  6. S/p bilateral CEA 7. HTN 8. COPD.  9. S/p GI  bleed in April due to gastric ulcers.    Stable from a cardiac standpoint. Will sign off.  Call for questions .    Nahser, Wonda Cheng, MD  09/27/2014 8:04 AM    [address],  [place], [place]  [address] Pager [phone] Phone: [phone]; Fax: [phone]   [facility]  [address] [place], [place]  [address] [phone]   Fax [phone]

## 2014-09-27 NOTE — Progress Notes (Signed)
Patient with several loose stools in past 24 hours. Held colace last night and tonight. Stool sent for c diff per protocol.

## 2014-09-27 NOTE — Progress Notes (Signed)
[facility] PROGRESS NOTE  [name] CHE:[id number] DOB: June 29, 1923 DOA: 09/22/2014 PCP: [name]  Brief narrative: 09/24/2014: I have seen and examined [name] at bedside and reviewed her chart including off service note by Dr. Wynelle Cleveland. [facility]. [name] [name] is a pleasant 81 y.o. female with CAD, HTN, HLD, COPD, carotid artery disease s/p bilateral CEA, atrial fibrillation and chronic diastolic CHF, ascites (unknown cause), who presented with increased abdominal distension, now status post paracentesis twice with concern for malignant ascites, final cytology results pending. She is on treatment for presumed SBP although fluid cultures have remained negative. Patient also had 2-D echocardiogram which showed a decrease in ejection fraction from 50% in 2015 to 30-35% this time around. She complains of pleuritic chest pain, and it is possible that she has healthcare associated pneumonia given she was hospitalized a couple of months ago. Will therefore change antibiotics to vancomycin/cefepime to cover for nosocomial organisms, consult cardiology regarding low ejection fraction, ?further workup and follow GI recommendations. Otherwise she complains of cough and abdominal distention but leg edema has resolved. 09/25/2014: Appreciate GI/cardiology. CA-125 elevated. More concern for ovarian CA. Shared these concerns with patient and her daughter. Await final pathology results. Meanwhile, will continue current antibiotic and follow GI/cardiology recommendations. 09/26/2014: Septic workup remains negative. Discussed with Dr. Gerilyn Nestle. [place]. Appreciate help. Patient likely has malignant ascites from ovarian CA. Will consult oncology for help with further diagnostic pathway, discontinue vancomycin and follow GI/cardiology recommendations. Patient on raloxifene, not sure of indication. Will discontinue for now given cardiac issues. 09/27/2014: Appreciate cardiology/general  surgery. Spoke with Dr. Zella Richer. I agree with thought process regarding whether patient would eventually be a candidate for chemotherapy. At this point her comorbidities militate against aggressive treatment of any type of cancer, is patient with renal insufficiency/CHF at 81 years of age. We'll follow-up with patient and family regarding decision on laparoscopic biopsy. Meanwhile, patient is worsening renal insufficiency which is concerning. Her BP is borderline low which may be worsening the renal insufficiency as well. We'll gently rehydrate and discontinue antibiotics Assessment/Plan: Principal Problem: Ascites, presumed malignant with question of SBP (spontaneous bacterial peritonitis) at admission - paracentesis on 7/14 as outpt -Discontinue ceftazidime -Follow oncology/GI/general surgery recommendations -Patient may need peritoneal catheter   Acute Systolic CHF- diastolic CHF?- mod AS. Borderline low BP - EF found to be 30-35% today- last ECHO last yr- EF was 50 % and had mild pulm HTN - she does not have significant right heart failure on ECHO - Has mild pulm edema on CXR, possible pneumonia - Follow cardiology recommendations.   Atrial fibrillation - Rate controlled. Still on heparin drip as may need more surgical intervention. Appreciate pharmacy.  CKD (chronic kidney disease), stage 3- 4 - Worsening -Rehydrate and follow  COPD - cont Symbicort, Xopenex, Singulair.  - Discontinued vancomycin, ceftazidime  Appt with PCP: requested Code Status: full code Family Communication:  Disposition Plan: f/u on ascitic fluid path DVT prophylaxis: Heparin Consultants:GI/cardiology Procedures: Paracentesis Antibiotics:  Vancomycin 09/24/2014> 09/25/14  Ceftazidime 09/24/2014> 09/27/2014  HPI/Subjective: No complaint  Objective: Filed Vitals:   09/27/14 1940  BP:   Pulse: 77  Temp:   Resp: 18    Intake/Output Summary (Last 24 hours) at 09/27/14 2023 Last data  filed at 09/27/14 1826  Gross per 24 hour  Intake   1301 ml  Output    500 ml  Net    801 ml   Filed Weights   09/24/14 0638 09/26/14 0726 09/27/14 0830  Weight: 69.627  kg (153 lb 8 oz) 69.854 kg (154 lb) 69.899 kg (154 lb 1.6 oz)    Exam:   General:  Comfortable at rest.  Cardiovascular: S1-S2 normal. No murmurs. Pulse regular.  Respiratory: Good air entry bilaterally. No rhonchi or rales.  Abdomen: Distended.  Musculoskeletal: No pedal edema   Neurological: Intact  Data Reviewed: Basic Metabolic Panel:  Recent Labs Lab 09/22/14 1654 09/22/14 2115 09/23/14 0441 09/25/14 0350 09/27/14 0502  NA 136  --  138 141 133*  K 3.8  --  3.5 3.9 4.4  CL 100*  --  101 102 98*  CO2 27  --  28 30 28   GLUCOSE 95  --  99 87 102*  BUN 30*  --  30* 32* 42*  CREATININE 2.04* 1.97* 1.93* 1.91* 2.46*  CALCIUM 8.4*  --  8.1* 8.6* 8.7*  MG  --   --  2.0  --   --   PHOS  --   --  3.6  --   --    Liver Function Tests:  Recent Labs Lab 09/22/14 1654 09/23/14 0441  AST 24 20  ALT 17 14  ALKPHOS 64 70  BILITOT 0.5 0.4  PROT 5.9* 5.4*  ALBUMIN 2.5* 2.3*    Recent Labs Lab 09/22/14 1654  LIPASE 29   No results for input(s): AMMONIA in the last 168 hours. CBC:  Recent Labs Lab 09/22/14 1654  09/23/14 0441 09/24/14 1815 09/25/14 0350 09/26/14 0335 09/27/14 0502  WBC 9.9  < > 7.7 8.9 8.3 8.6 8.3  NEUTROABS 8.6*  --   --   --   --   --   --   HGB 11.2*  < > 10.6* 11.1* 10.9* 10.7* 10.4*  HCT 34.7*  < > 33.1* 36.1 35.2* 34.1* 33.0*  MCV 89.2  < > 89.5 90.5 88.9 90.2 88.5  PLT 274  < > 281 268 250 249 244  < > = values in this interval not displayed. Cardiac Enzymes: No results for input(s): CKTOTAL, CKMB, CKMBINDEX, TROPONINI in the last 168 hours. BNP (last 3 results)  Recent Labs  02/26/14 1550 09/12/14 1448 09/22/14 1701  BNP 292.3* 360.2* 165.9*    ProBNP (last 3 results) No results for input(s): PROBNP in the last 8760 hours.  CBG:  Recent  Labs Lab 09/23/14 0817 09/24/14 0745 09/25/14 0808 09/26/14 0754 09/27/14 0740  GLUCAP 101* 78 125* 90 90    Recent Results (from the past 240 hour(s))  Culture, body fluid-bottle     Status: None   Collection Time: 09/18/14 12:33 PM  Result Value Ref Range Status   Specimen Description FLUID ABDOMEN PERITONEAL  Final   Special Requests BAA 5MLS  Final   Culture NO GROWTH 5 DAYS  Final   Report Status 09/23/2014 FINAL  Final  Gram stain     Status: None   Collection Time: 09/18/14 12:33 PM  Result Value Ref Range Status   Specimen Description FLUID ABDOMEN PERITONEAL  Final   Special Requests NONE  Final   Gram Stain   Final    MODERATE WBC PRESENT, PREDOMINANTLY PMN NO ORGANISMS SEEN    Report Status 09/19/2014 FINAL  Final  AFB culture with smear     Status: None (Preliminary result)   Collection Time: 09/23/14 10:39 AM  Result Value Ref Range Status   Specimen Description ABDOMEN  Final   Special Requests NONE  Final   Acid Fast Smear   Final    NO ACID  FAST BACILLI SEEN Performed at [facility]    Culture   Final    CULTURE WILL BE EXAMINED FOR 6 WEEKS BEFORE ISSUING A FINAL REPORT Performed at [facility]    Report Status PENDING  Incomplete  Culture, body fluid-bottle     Status: None (Preliminary result)   Collection Time: 09/23/14 10:39 AM  Result Value Ref Range Status   Specimen Description FLUID ABDOMEN PERITONEAL  Final   Special Requests NONE  Final   Culture NO GROWTH 4 DAYS  Final   Report Status PENDING  Incomplete  Gram stain     Status: None   Collection Time: 09/23/14 10:39 AM  Result Value Ref Range Status   Specimen Description FLUID ABDOMEN PERITONEAL  Final   Special Requests NONE  Final   Gram Stain   Final    ABUNDANT WBC PRESENT,BOTH PMN AND MONONUCLEAR NO ORGANISMS SEEN    Report Status 09/23/2014 FINAL  Final  Clostridium Difficile by PCR (not at [facility])     Status: None   Collection Time: 09/26/14 10:26 PM   Result Value Ref Range Status   C difficile by pcr NEGATIVE NEGATIVE Final     Studies: No results found.  Scheduled Meds: . allopurinol  100 mg Oral q morning - 10a  . [START ON 09/28/2014] bisoprolol  2.5 mg Oral Daily  . budesonide-formoterol  2 puff Inhalation BID  . cholecalciferol  1,000 Units Oral QHS  . cholecalciferol  2,000 Units Oral Daily  . docusate sodium  100 mg Oral BID  . feeding supplement (ENSURE ENLIVE)  237 mL Oral BID BM  . folic acid  1 mg Oral Daily  . montelukast  10 mg Oral QHS  . multivitamin with minerals  1 tablet Oral Daily  . pantoprazole  80 mg Oral QPM  . saccharomyces boulardii  250 mg Oral BID  . sodium chloride  250 mL Intravenous Once  . sodium chloride  3 mL Intravenous Q12H  . thiamine  100 mg Oral Daily   Continuous Infusions: . sodium chloride 50 mL/hr at 09/27/14 1230  . heparin 1,200 Units/hr (09/27/14 2017)     Time spent: 25 minutes    Amenah Tucci  [facility] Pager [phone]. If 7PM-7AM, please contact night-coverage at [email or web address], password [email or web address] 09/27/2014, 8:23 PM  LOS: 5 days

## 2014-09-27 NOTE — Progress Notes (Addendum)
ANTICOAGULATION CONSULT NOTE - Follow Up Consult  Pharmacy Consult for Heparin Indication: atrial fibrillation  Allergies  Allergen Reactions  . Baby Powder [Methylbenzethonium] Shortness Of Breath  . Demerol [Meperidine] Hives  . Fexofenadine Other (See Comments)    dizziness  . Penicillins Hives    Tolerates Ceftriaxone.  Marland Kitchen Spiriva Handihaler [Tiotropium Bromide Monohydrate]     Caused chest pains    Patient Measurements: Height: 5\' 1"  (154.9 cm) Weight: 154 lb (69.854 kg) IBW/kg (Calculated) : 47.8 Heparin Dosing Weight:   Vital Signs: Temp: 98 F (36.7 C) (07/23 0259) Temp Source: Oral (07/23 0259) BP: 92/42 mmHg (07/22 2255) Pulse Rate: 73 (07/22 2210)  Labs:  Recent Labs  09/25/14 0350  09/26/14 0335 09/26/14 1405 09/26/14 2156 09/27/14 0502  HGB 10.9*  --  10.7*  --   --  10.4*  HCT 35.2*  --  34.1*  --   --  33.0*  PLT 250  --  249  --   --  244  APTT 85*  < > 65* 46* 55* 72*  HEPARINUNFRC 1.20*  < > 0.69 0.63 0.42 0.45  CREATININE 1.91*  --   --   --   --  2.46*  < > = values in this interval not displayed.  Estimated Creatinine Clearance: 14.1 mL/min (by C-G formula based on Cr of 2.46).   Medications:  Infusions:  . heparin 1,200 Units/hr (09/26/14 2348)    Assessment: Patient with heparin level and PTT at goal and correlating.  Will plan on just using heparin levels for now on.  No heparin issues noted.  Goal of Therapy:  Heparin level 0.3-0.7 units/ml Monitor platelets by anticoagulation protocol: Yes   Plan:  Continue heparin drip at current rate Recheck level at [address], [place] 09/27/2014,5:58 AM

## 2014-09-27 NOTE — Consult Note (Signed)
Reason for Consult:  Malignant ascites Referring Physician: Dr. Benson Setting [name] [name] is an 81 y.o. female.  HPI:  This is an 81 year old female who is relatively independent. She's had 6 months of weight loss because of a poor appetite. More recently, she's has abdominal distention due to ascites. She's had paracentesis twice. Both times cytology was suggestive of malignant cells but no specific cell type was noted. She's now had nausea and vomiting. She has some abdominal pain at times when the abdomen is tense. We were asked to see her for possible diagnostic laparoscopy and possible biopsy if indicated intra-abdominal lesions were noted. Of note is that the CEA and CA 19-9 are normal. CEA 125 is elevated.  CT scan on July 8 demonstrates circumferential wall thickening of the distal stomach "possible"; No adnexal masses; large amount of ascites with omental stranding.  Past Medical History  Diagnosis Date  . Hypertension   . Hyperlipidemia   . COPD (chronic obstructive pulmonary disease)   . Osteoporosis   . H/O hiatal hernia   . Chronic back pain   . CAD (coronary artery disease)     LHC 9/05:  pLAD less than 20%, D1 20-30%, ostial RCA 40-50%, proximal RCA 20%, EF 60%.  . C. difficile diarrhea   . Gout   . Atrial fibrillation     a. failed DCCV => b. amiodarone added => converted to NSR on Amiodarone;  c. Xarelto d/c'd 2/2 falling (wrist and hip Fx in 1/14)  . Carotid stenosis     s/p bilat CEA  . C. difficile colitis     10/2011;  11/2011  . Salmonella enteritis 08/2011    c/b septic shock  . Hx of echocardiogram     a. Echocardiogram 09/29/11: Mild LVH, EF 45-50%, diffuse HK, mild to moderate aortic stenosis, mean gradient 12 mmHg, moderate MAC, mild MR, moderate LAE, moderate RAE, question small secundum ASD with left to right flow not seen in 4 chamber view, PASP 35-39 (mild pulmonary hypertension). ;  b.  TEE on 10/04/11: Mild LVH, EF 55%, mild MR, no defect or PFO  . Pneumonia 11/2101   . PONV (postoperative nausea and vomiting)   . CHF (congestive heart failure)   . Chronic kidney disease   . GERD (gastroesophageal reflux disease)   . Hepatitis   . Anemia   . Hx of echocardiogram     Echo (09/2013): EF 55%, normal wall motion, mild aortic stenosis (mean 12 mm Hg), MAC, mild MR, mild LAE, mild PI, PASP 30 mm Hg  . Hx of cardiovascular stress test     Lexiscan Myoview (7/15):  No ischemia or infarct, EF 50%, Low Risk  . Arthritis     Neck  . PAF (paroxysmal atrial fibrillation)     Past Surgical History  Procedure Laterality Date  . Carotid endarterectomy      bilateral  . Cataract extraction, bilateral    . Joint replacement  2008    Right Total Knee  . Anal fistula repair    . Left parotidectomy    . Eye surgery    . Flexible sigmoidoscopy  09/03/2011    Procedure: FLEXIBLE SIGMOIDOSCOPY;  Surgeon: Beryle Beams, MD;  Location: [facility];  Service: Endoscopy;  Laterality: N/A;  . Tee without cardioversion  10/04/2011    Procedure: TRANSESOPHAGEAL ECHOCARDIOGRAM (TEE);  Surgeon: Larey Dresser, MD;  Location: [place];  Service: Cardiovascular;  Laterality: N/A;  to be carelinked here by 1230-verified 7/29/dl  .  Cardioversion  10/04/2011    Procedure: CARDIOVERSION;  Surgeon: Larey Dresser, MD;  Location: [facility];  Service: Cardiovascular;  Laterality: N/A;  . Esophagogastroduodenoscopy  11/29/2011    Procedure: ESOPHAGOGASTRODUODENOSCOPY (EGD);  Surgeon: Cleotis Nipper, MD;  Location: [facility];  Service: Endoscopy;  Laterality: N/A;  recent h/o resp failure/pneumonia  . Left hip fracture repair  03/14/12  . Open reduction internal fixation (orif) scaphoid with distal radius graft  03/14/2012    Procedure: OPEN REDUCTION INTERNAL FIXATION (ORIF) SCAPHOID WITH DISTAL RADIUS GRAFT;  Surgeon: Jolyn Nap, MD;  Location: [facility];  Service: Orthopedics;  Laterality: Left;  DVR wrist fracture set/ hand innovation  . Hip pinning,cannulated  03/14/2012     Procedure: CANNULATED HIP PINNING;  Surgeon: Jolyn Nap, MD;  Location: [facility];  Service: Orthopedics;  Laterality: Left;  Biomet 6.5 cannulated screws  . Femur im nail Left 04/13/2012    Procedure: INTRAMEDULLARY (IM) NAIL FEMORAL;  Surgeon: Jolyn Nap, MD;  Location: [place];  Service: Orthopedics;  Laterality: Left;  OPEN REDUCTION INTERNAL FIXATION LEFT PROXIMAL FEMUR Fracture     Family History  Problem Relation Age of Onset  . Hemochromatosis Sister   . Diabetes Sister   . Hypertension Sister   . Diabetes type II Mother   . Diabetes Mother   . Hypertension Mother   . Deep vein thrombosis Mother   . Hypertension Father   . Hypertension Brother   . Other Brother     Leg amputation-Gun shot  . Depression Brother   . Hemochromatosis Sister   . Heart attack Neg Hx   . Stroke Neg Hx   . Cancer Sister     Social History:  reports that she quit smoking about 31 years ago. Her smoking use included Cigarettes. She has a 20 pack-year smoking history. She has never used smokeless tobacco. She reports that she does not drink alcohol or use illicit drugs.  Allergies:  Allergies  Allergen Reactions  . Baby Powder [Methylbenzethonium] Shortness Of Breath  . Demerol [Meperidine] Hives  . Fexofenadine Other (See Comments)    dizziness  . Penicillins Hives    Tolerates Ceftriaxone.  Marland Kitchen Spiriva Handihaler [Tiotropium Bromide Monohydrate]     Caused chest pains     Current facility-administered medications:  .  0.45 % sodium chloride infusion, , Intravenous, Continuous, Simbiso Ranga, MD .  0.9 %  sodium chloride infusion, 250 mL, Intravenous, PRN, Allyne Gee, MD .  albuterol (PROVENTIL) (2.5 MG/3ML) 0.083% nebulizer solution 2.5 mg, 2.5 mg, Nebulization, Q6H PRN, Allyne Gee, MD .  allopurinol (ZYLOPRIM) tablet 100 mg, 100 mg, Oral, q morning - 10a, Allyne Gee, MD, 100 mg at 09/27/14 1027 .  bisacodyl (DULCOLAX) suppository 10 mg, 10 mg, Rectal, Daily PRN, Allyne Gee, MD .  bisoprolol (ZEBETA) tablet 2.5 mg, 2.5 mg, Oral, Daily, Peter M Martinique, MD, 2.5 mg at 09/27/14 1027 .  budesonide-formoterol (SYMBICORT) 160-4.5 MCG/ACT inhaler 2 puff, 2 puff, Inhalation, BID, Allyne Gee, MD, 2 puff at 09/27/14 0859 .  cholecalciferol (VITAMIN D) tablet 1,000 Units, 1,000 Units, Oral, QHS, Allyne Gee, MD, 1,000 Units at 09/26/14 2110 .  cholecalciferol (VITAMIN D) tablet 2,000 Units, 2,000 Units, Oral, Daily, Allyne Gee, MD, 2,000 Units at 09/27/14 1026 .  docusate sodium (COLACE) capsule 100 mg, 100 mg, Oral, BID, Allyne Gee, MD, 100 mg at 09/26/14 1012 .  feeding supplement (ENSURE ENLIVE) (ENSURE ENLIVE) liquid 237  mL, 237 mL, Oral, BID BM, Clayton Bibles, RD, 237 mL at 09/26/14 1549 .  folic acid (FOLVITE) tablet 1 mg, 1 mg, Oral, Daily, Allyne Gee, MD, 1 mg at 09/27/14 1027 .  heparin ADULT infusion 100 units/mL (25000 units/250 mL), 1,200 Units/hr, Intravenous, Continuous, Simbiso Ranga, MD, Last Rate: 12 mL/hr at 09/26/14 2348, 1,200 Units/hr at 09/26/14 2348 .  levalbuterol (XOPENEX) nebulizer solution 0.63 mg, 0.63 mg, Nebulization, Q6H PRN, Allyne Gee, MD .  metoCLOPramide (REGLAN) injection 5 mg, 5 mg, Intravenous, Q6H PRN, Gardiner Barefoot, NP, 5 mg at 09/27/14 0050 .  montelukast (SINGULAIR) tablet 10 mg, 10 mg, Oral, QHS, Allyne Gee, MD, 10 mg at 09/26/14 2111 .  morphine 2 MG/ML injection 1-2 mg, 1-2 mg, Intravenous, Q4H PRN, Debbe Odea, MD, 2 mg at 09/24/14 1117 .  multivitamin with minerals tablet 1 tablet, 1 tablet, Oral, Daily, Allyne Gee, MD, 1 tablet at 09/27/14 1027 .  ondansetron (ZOFRAN) tablet 4 mg, 4 mg, Oral, Q6H PRN **OR** ondansetron (ZOFRAN) injection 4 mg, 4 mg, Intravenous, Q6H PRN, Allyne Gee, MD, 4 mg at 09/24/14 1948 .  pantoprazole (PROTONIX) EC tablet 80 mg, 80 mg, Oral, QPM, Allyne Gee, MD, 80 mg at 09/26/14 1726 .  polyethylene glycol (MIRALAX / GLYCOLAX) packet 17 g, 17 g, Oral, Daily PRN, Allyne Gee, MD .  saccharomyces boulardii (FLORASTOR) capsule 250 mg, 250 mg, Oral, BID, Allyne Gee, MD, 250 mg at 09/27/14 1027 .  sodium chloride 0.9 % injection 3 mL, 3 mL, Intravenous, Q12H, Allyne Gee, MD, 3 mL at 09/26/14 2111 .  sodium chloride 0.9 % injection 3 mL, 3 mL, Intravenous, PRN, Allyne Gee, MD, 3 mL at 09/24/14 1948 .  thiamine (VITAMIN B-1) tablet 100 mg, 100 mg, Oral, Daily, Allyne Gee, MD, 100 mg at 09/27/14 1027 .  traMADol (ULTRAM) tablet 100 mg, 100 mg, Oral, Q6H PRN, Simbiso Ranga, MD, 100 mg at 09/24/14 1612   Results for orders placed or performed during the hospital encounter of 09/22/14 (from the past 48 hour(s))  Occult blood card to lab, stool RN will collect     Status: None   Collection Time: 09/25/14  8:47 PM  Result Value Ref Range   Fecal Occult Bld NEGATIVE NEGATIVE  CBC     Status: Abnormal   Collection Time: 09/26/14  3:35 AM  Result Value Ref Range   WBC 8.6 4.0 - 10.5 K/uL   RBC 3.78 (L) 3.87 - 5.11 MIL/uL   Hemoglobin 10.7 (L) 12.0 - 15.0 g/dL   HCT 34.1 (L) 36.0 - 46.0 %   MCV 90.2 78.0 - 100.0 fL   MCH 28.3 26.0 - 34.0 pg   MCHC 31.4 30.0 - 36.0 g/dL   RDW 15.3 11.5 - 15.5 %   Platelets 249 150 - 400 K/uL  Heparin level (unfractionated)     Status: None   Collection Time: 09/26/14  3:35 AM  Result Value Ref Range   Heparin Unfractionated 0.69 0.30 - 0.70 IU/mL    Comment:        IF HEPARIN RESULTS ARE BELOW EXPECTED VALUES, AND PATIENT DOSAGE HAS BEEN CONFIRMED, SUGGEST FOLLOW UP TESTING OF ANTITHROMBIN III LEVELS.   APTT     Status: Abnormal   Collection Time: 09/26/14  3:35 AM  Result Value Ref Range   aPTT 65 (H) 24 - 37 seconds    Comment:  IF BASELINE aPTT IS ELEVATED, SUGGEST PATIENT RISK ASSESSMENT BE USED TO DETERMINE APPROPRIATE ANTICOAGULANT THERAPY.   Glucose, capillary     Status: None   Collection Time: 09/26/14  7:54 AM  Result Value Ref Range   Glucose-Capillary 90 65 - 99 mg/dL   Comment 1  Notify RN   Heparin level (unfractionated)     Status: None   Collection Time: 09/26/14  2:05 PM  Result Value Ref Range   Heparin Unfractionated 0.63 0.30 - 0.70 IU/mL    Comment:        IF HEPARIN RESULTS ARE BELOW EXPECTED VALUES, AND PATIENT DOSAGE HAS BEEN CONFIRMED, SUGGEST FOLLOW UP TESTING OF ANTITHROMBIN III LEVELS.   APTT     Status: Abnormal   Collection Time: 09/26/14  2:05 PM  Result Value Ref Range   aPTT 46 (H) 24 - 37 seconds    Comment:        IF BASELINE aPTT IS ELEVATED, SUGGEST PATIENT RISK ASSESSMENT BE USED TO DETERMINE APPROPRIATE ANTICOAGULANT THERAPY.   APTT     Status: Abnormal   Collection Time: 09/26/14  9:56 PM  Result Value Ref Range   aPTT 55 (H) 24 - 37 seconds  Heparin level (unfractionated)     Status: None   Collection Time: 09/26/14  9:56 PM  Result Value Ref Range   Heparin Unfractionated 0.42 0.30 - 0.70 IU/mL    Comment:        IF HEPARIN RESULTS ARE BELOW EXPECTED VALUES, AND PATIENT DOSAGE HAS BEEN CONFIRMED, SUGGEST FOLLOW UP TESTING OF ANTITHROMBIN III LEVELS.   Clostridium Difficile by PCR (not at [facility])     Status: None   Collection Time: 09/26/14 10:26 PM  Result Value Ref Range   C difficile by pcr NEGATIVE NEGATIVE  CBC     Status: Abnormal   Collection Time: 09/27/14  5:02 AM  Result Value Ref Range   WBC 8.3 4.0 - 10.5 K/uL   RBC 3.73 (L) 3.87 - 5.11 MIL/uL   Hemoglobin 10.4 (L) 12.0 - 15.0 g/dL   HCT 33.0 (L) 36.0 - 46.0 %   MCV 88.5 78.0 - 100.0 fL   MCH 27.9 26.0 - 34.0 pg   MCHC 31.5 30.0 - 36.0 g/dL   RDW 15.1 11.5 - 15.5 %   Platelets 244 150 - 400 K/uL  Heparin level (unfractionated)     Status: None   Collection Time: 09/27/14  5:02 AM  Result Value Ref Range   Heparin Unfractionated 0.45 0.30 - 0.70 IU/mL    Comment:        IF HEPARIN RESULTS ARE BELOW EXPECTED VALUES, AND PATIENT DOSAGE HAS BEEN CONFIRMED, SUGGEST FOLLOW UP TESTING OF ANTITHROMBIN III LEVELS.   APTT     Status: Abnormal    Collection Time: 09/27/14  5:02 AM  Result Value Ref Range   aPTT 72 (H) 24 - 37 seconds    Comment:        IF BASELINE aPTT IS ELEVATED, SUGGEST PATIENT RISK ASSESSMENT BE USED TO DETERMINE APPROPRIATE ANTICOAGULANT THERAPY.   Basic metabolic panel     Status: Abnormal   Collection Time: 09/27/14  5:02 AM  Result Value Ref Range   Sodium 133 (L) 135 - 145 mmol/L   Potassium 4.4 3.5 - 5.1 mmol/L   Chloride 98 (L) 101 - 111 mmol/L   CO2 28 22 - 32 mmol/L   Glucose, Bld 102 (H) 65 - 99 mg/dL   BUN 42 (H) 6 -  20 mg/dL   Creatinine, Ser 2.46 (H) 0.44 - 1.00 mg/dL   Calcium 8.7 (L) 8.9 - 10.3 mg/dL   GFR calc non Af Amer 16 (L) >60 mL/min   GFR calc Af Amer 19 (L) >60 mL/min    Comment: (NOTE) The eGFR has been calculated using the CKD EPI equation. This calculation has not been validated in all clinical situations. eGFR's persistently <60 mL/min signify possible Chronic Kidney Disease.    Anion gap 7 5 - 15  Glucose, capillary     Status: None   Collection Time: 09/27/14  7:40 AM  Result Value Ref Range   Glucose-Capillary 90 65 - 99 mg/dL   Comment 1 Notify RN    Comment 2 Document in Chart   Heparin level (unfractionated)     Status: None   Collection Time: 09/27/14  1:40 PM  Result Value Ref Range   Heparin Unfractionated 0.39 0.30 - 0.70 IU/mL    Comment:        IF HEPARIN RESULTS ARE BELOW EXPECTED VALUES, AND PATIENT DOSAGE HAS BEEN CONFIRMED, SUGGEST FOLLOW UP TESTING OF ANTITHROMBIN III LEVELS.     No results found.  Review of Systems  Constitutional:       Anorexia.  Gastrointestinal: Positive for nausea, vomiting and abdominal pain. Negative for blood in stool.  Neurological:       Weakness.   Blood pressure 80/50, pulse 86, temperature 98 F (36.7 C), temperature source Oral, resp. rate 18, height _0  (1.549 m), weight 69.899 kg (154 lb 1.6 oz), SpO2 98 %. Physical Exam  Constitutional:  Elderly female in no acute distress. Her daughter is with  her.  Eyes: No scleral icterus.  GI: Soft. Bowel sounds are normal. She exhibits distension. There is no tenderness.  Fluid wave is present.  Skin: There is pallor.    Assessment/Plan: Recurrent ascites with cytologies suggesting of malignant process. CA-125 is elevated suggesting ovarian primary. Tissue diagnosis is requested so that possible chemotherapy regimens can be discussed with her. She has other significant co-morbidities which may not allow her to tolerate the chemotherapy.  Plan:  I told her the first decision she had to make is whether or not she wanted to have chemotherapy. If not, I don't feel there is any reason to proceed with an operation to try to biopsy anything. If she would like to consider having chemotherapy, I would first try IR guided percutaneous biopsy of some of the abnormal omental areas. If this does not give an answer, then laparoscopy could be discussed with her.  She would be at an increased operative risk. She and her daughter understand this. We will speak with them tomorrow to see what they have decided. I will recommend having an oncologist come speak with him about the toxicities of chemotherapy for ovarian cancer.  Breyon Sigg J 09/27/2014, 5:37 PM

## 2014-09-27 NOTE — Plan of Care (Signed)
Problem: Phase I Progression Outcomes Goal: Hemodynamically stable Outcome: Progressing BP low

## 2014-09-27 NOTE — Care Management (Signed)
Medicare Important Message given? YES  Date Medicare IM given:09/27/14  Medicare IM given by: Cherryl Babin RN CCM 

## 2014-09-27 NOTE — Progress Notes (Signed)
ANTICOAGULATION CONSULT NOTE - Follow Up Consult  Pharmacy Consult for Heparin Indication: atrial fibrillation (Eliquis on hold)  Allergies  Allergen Reactions  . Baby Powder [Methylbenzethonium] Shortness Of Breath  . Demerol [Meperidine] Hives  . Fexofenadine Other (See Comments)    dizziness  . Penicillins Hives    Tolerates Ceftriaxone.  Marland Kitchen Spiriva Handihaler [Tiotropium Bromide Monohydrate]     Caused chest pains    Patient Measurements: Height: 5\' 1"  (154.9 cm) Weight: 154 lb 1.6 oz (69.899 kg) IBW/kg (Calculated) : 47.8 Heparin Dosing Weight: 63 kg  Vital Signs: Temp: 97.8 F (36.6 C) (07/23 0633) Temp Source: Oral (07/23 2536) BP: 87/56 mmHg (07/23 6440) Pulse Rate: 92 (07/23 0633)  Labs:  Recent Labs  09/25/14 0350  09/26/14 0335 09/26/14 1405 09/26/14 2156 09/27/14 0502  HGB 10.9*  --  10.7*  --   --  10.4*  HCT 35.2*  --  34.1*  --   --  33.0*  PLT 250  --  249  --   --  244  APTT 85*  < > 65* 46* 55* 72*  HEPARINUNFRC 1.20*  < > 0.69 0.63 0.42 0.45  CREATININE 1.91*  --   --   --   --  2.46*  < > = values in this interval not displayed.  Estimated Creatinine Clearance: 14.1 mL/min (by C-G formula based on Cr of 2.46).   Medications:  Infusions:  . sodium chloride    . heparin 1,200 Units/hr (09/26/14 2348)    Assessment: 89 yoF admitted on 7/18 for SBP and emesis.  PMH includes apixaban PTA for hx PAF, CAD s/p bilateral CEA, chronic diastolic CHF (EF 34-74%), and ascites.  Anticoagulation was held for paracentesis on 7/19. Pharmacy consulted to start IV heparin after paracentesis completed in case patient required further procedures.   Last dose apixaban 2.5mg  on 7/18 @ 0930.  Poor renal function with CrCl < 30 ml/min.  Today, 09/27/2014:  Heparin level 0.39, remains therapeutic  Heparin levels appear to be correlating with APTT (HL previously influenced by delayed clearance of apixaban d/t renal function)  CBC: Hgb 10.4 (low/stable), and  Plt 244  Renal: SCr increased to 2.46 with CrCl ~ 14 ml/min   Goal of Therapy:  Heparin level 0.3-0.7 units/ml Monitor platelets by anticoagulation protocol: Yes   Plan:   Continue heparin IV infusion at 1200 units/hr  Daily heparin level and CBC  Continue to monitor H&H and platelets  Follow up long-term anticoag plan after invasive procedures completed.  Note, Patients with SCr > 2.5 or CrCl < 25 ml/min were excluded from clinical trials.  In patients with severe or end-stage chronic kidney disease, warfarin remains the anticoagulant of choice.   Gretta Arab PharmD, BCPS Pager [phone] 09/27/2014 12:53 PM

## 2014-09-27 NOTE — Progress Notes (Signed)
Texted paged NP Lynch about rising creat/BUN. Awaiting call back or orders.

## 2014-09-28 DIAGNOSIS — N289 Disorder of kidney and ureter, unspecified: Secondary | ICD-10-CM

## 2014-09-28 DIAGNOSIS — R339 Retention of urine, unspecified: Secondary | ICD-10-CM | POA: Diagnosis not present

## 2014-09-28 LAB — BASIC METABOLIC PANEL
Anion gap: 8 (ref 5–15)
BUN: 43 mg/dL — ABNORMAL HIGH (ref 6–20)
CALCIUM: 8.9 mg/dL (ref 8.9–10.3)
CHLORIDE: 101 mmol/L (ref 101–111)
CO2: 26 mmol/L (ref 22–32)
Creatinine, Ser: 2.11 mg/dL — ABNORMAL HIGH (ref 0.44–1.00)
GFR calc non Af Amer: 20 mL/min — ABNORMAL LOW (ref >60)
GFR, EST AFRICAN AMERICAN: 23 mL/min — AB (ref >60)
Glucose, Bld: 87 mg/dL (ref 65–99)
Potassium: 4.5 mmol/L (ref 3.5–5.1)
Sodium: 135 mmol/L (ref 135–145)

## 2014-09-28 LAB — CULTURE, BODY FLUID-BOTTLE

## 2014-09-28 LAB — HEPARIN LEVEL (UNFRACTIONATED)
Heparin Unfractionated: 0.32 IU/mL (ref 0.30–0.70)
Heparin Unfractionated: 0.31 IU/mL (ref 0.30–0.70)

## 2014-09-28 LAB — CBC
HCT: 32.5 % — ABNORMAL LOW (ref 36.0–46.0)
Hemoglobin: 10 g/dL — ABNORMAL LOW (ref 12.0–15.0)
MCH: 27.2 pg (ref 26.0–34.0)
MCHC: 30.8 g/dL (ref 30.0–36.0)
MCV: 88.6 fL (ref 78.0–100.0)
Platelets: 265 10*3/uL (ref 150–400)
RBC: 3.67 MIL/uL — AB (ref 3.87–5.11)
RDW: 15.1 % (ref 11.5–15.5)
WBC: 9.5 10*3/uL (ref 4.0–10.5)

## 2014-09-28 LAB — CULTURE, BODY FLUID W GRAM STAIN -BOTTLE: Culture: NO GROWTH

## 2014-09-28 MED ORDER — HEPARIN (PORCINE) IN NACL 100-0.45 UNIT/ML-% IJ SOLN
1300.0000 [IU]/h | INTRAMUSCULAR | Status: DC
  Administered 2014-09-28: 1300 [IU]/h via INTRAVENOUS
  Filled 2014-09-28 (×3): qty 250

## 2014-09-28 MED ORDER — SODIUM CHLORIDE 0.9 % IV BOLUS (SEPSIS)
500.0000 mL | Freq: Once | INTRAVENOUS | Status: AC
  Administered 2014-09-28: 500 mL via INTRAVENOUS

## 2014-09-28 MED ORDER — SODIUM CHLORIDE 0.9 % IV BOLUS (SEPSIS)
250.0000 mL | Freq: Once | INTRAVENOUS | Status: AC
  Administered 2014-09-28: 250 mL via INTRAVENOUS

## 2014-09-28 MED ORDER — LEVOFLOXACIN 250 MG PO TABS
250.0000 mg | ORAL_TABLET | ORAL | Status: DC
  Administered 2014-09-30: 250 mg via ORAL
  Filled 2014-09-28 (×2): qty 1

## 2014-09-28 MED ORDER — LEVOFLOXACIN 500 MG PO TABS
500.0000 mg | ORAL_TABLET | Freq: Once | ORAL | Status: AC
  Administered 2014-09-28: 500 mg via ORAL
  Filled 2014-09-28: qty 1

## 2014-09-28 NOTE — Progress Notes (Signed)
[facility] PROGRESS NOTE  [name] ZOX:[id number] DOB: [birth year] DOA: 09/22/2014 PCP: [name]  Brief narrative: 09/24/2014: I have seen and examined [name] at bedside and reviewed her chart including off service note by Dr. Wynelle Cleveland. [facility]. [name] [name] is a pleasant 81 y.o. female with CAD, HTN, HLD, COPD, carotid artery disease s/p bilateral CEA, atrial fibrillation and chronic diastolic CHF, ascites (unknown cause), who presented with increased abdominal distension, now status post paracentesis twice with concern for malignant ascites, final cytology results pending. She is on treatment for presumed SBP although fluid cultures have remained negative. Patient also had 2-D echocardiogram which showed a decrease in ejection fraction from 50% in 2015 to 30-35% this time around. She complains of pleuritic chest pain, and it is possible that she has healthcare associated pneumonia given she was hospitalized a couple of months ago. Will therefore change antibiotics to vancomycin/cefepime to cover for nosocomial organisms, consult cardiology regarding low ejection fraction, ?further workup and follow GI recommendations. Otherwise she complains of cough and abdominal distention but leg edema has resolved. 09/25/2014: Appreciate GI/cardiology. CA-125 elevated. More concern for ovarian CA. Shared these concerns with patient and her daughter. Await final pathology results. Meanwhile, will continue current antibiotic and follow GI/cardiology recommendations. 09/26/2014: Septic workup remains negative. Discussed with Dr. Gerilyn Nestle. [place]. Appreciate help. Patient likely has malignant ascites from ovarian CA. Will consult oncology for help with further diagnostic pathway, discontinue vancomycin and follow GI/cardiology recommendations. Patient on raloxifene, not sure of indication. Will discontinue for now given cardiac issues. 09/27/2014: Appreciate cardiology/general  surgery. Spoke with Dr. Zella Richer. I agree with thought process regarding whether patient would eventually be a candidate for chemotherapy. At this point her comorbidities militate against aggressive treatment of any type of cancer, is patient with renal insufficiency/CHF at 81 years of age. We'll follow-up with patient and family regarding decision on laparoscopic biopsy. Meanwhile, patient is worsening renal insufficiency which is concerning. Her BP is borderline low which may be worsening the renal insufficiency as well. We'll gently rehydrate and discontinue antibiotics 09/28/2014: Appreciate general surgery/GI. Noted plans for EGD in a.m. Patient's blood pressure borderline low-no evidence of bleeding, urinary retention present, BP low possibly as a result of antihypertensives/possible infection in setting of ascites. Will place Foley catheter for urinary retention, repeat UA/urine culture, resume antibiotics-Levaquin adjusted for creatinine clearance per pharmacy, and monitor for evidence of bleeding in which case would stop heparin-high risk for bleeding given malignant ascites. However hemoglobin has remained stable up to now. Patient denies any complaints except abdominal pain when she ambulates. Assessment/Plan: Principal Problem: Ascites, presumed malignant with question of SBP (spontaneous bacterial peritonitis) at admission - For EGD tomorrow  Acute Systolic CHF- diastolic CHF?- mod AS. Borderline low BP - EF found to be 30-35% today- last ECHO last yr- EF was 50 % and had mild pulm HTN - No evidence of fluid overload at present -Monitor fluid balance.   Atrial fibrillation - Rate controlled. Still on heparin drip as may need more surgical intervention. Appreciate pharmacy.  CKD (chronic kidney disease), stage 3- 4/urinary retention - Some improvement in creatinine. Likely postobstructive element -Placed indwelling Foley catheter -Gentle fluids  COPD - cont Symbicort, Xopenex,  Singulair.  - Appears stable  Appt with PCP: requested Code Status: full code Family Communication:  Disposition Plan: ?Skilled nursing facility DVT prophylaxis: Heparin Consultants:GI/cardiology Procedures: Paracentesis Antibiotics:  Vancomycin 09/24/2014> 09/25/14  Ceftazidime 09/24/2014> 09/27/2014  Levaquin 09/28/2014>  HPI/Subjective: Has abdominal pain when she ambulates.  Objective:  Filed Vitals:   09/28/14 1410  BP: 79/44  Pulse: 81  Temp: 98.5 F (36.9 C)  Resp: 18    Intake/Output Summary (Last 24 hours) at 09/28/14 1914 Last data filed at 09/28/14 1900  Gross per 24 hour  Intake 2539.23 ml  Output    650 ml  Net 1889.23 ml   Filed Weights   09/26/14 0726 09/27/14 0830 09/28/14 0200  Weight: 69.854 kg (154 lb) 69.899 kg (154 lb 1.6 oz) 71.895 kg (158 lb 8 oz)    Exam:   General:  Comfortable at rest.  Cardiovascular: S1-S2 normal. No murmurs. Pulse regular.  Respiratory: Good air entry bilaterally. No rhonchi or rales.  Abdomen: Soft and nontender. Distended.  Musculoskeletal: No pedal edema   Neurological: Intact  Data Reviewed: Basic Metabolic Panel:  Recent Labs Lab 09/22/14 1654 09/22/14 2115 09/23/14 0441 09/25/14 0350 09/27/14 0502 09/28/14 0518  NA 136  --  138 141 133* 135  K 3.8  --  3.5 3.9 4.4 4.5  CL 100*  --  101 102 98* 101  CO2 27  --  28 30 28 26   GLUCOSE 95  --  99 87 102* 87  BUN 30*  --  30* 32* 42* 43*  CREATININE 2.04* 1.97* 1.93* 1.91* 2.46* 2.11*  CALCIUM 8.4*  --  8.1* 8.6* 8.7* 8.9  MG  --   --  2.0  --   --   --   PHOS  --   --  3.6  --   --   --    Liver Function Tests:  Recent Labs Lab 09/22/14 1654 09/23/14 0441  AST 24 20  ALT 17 14  ALKPHOS 64 70  BILITOT 0.5 0.4  PROT 5.9* 5.4*  ALBUMIN 2.5* 2.3*    Recent Labs Lab 09/22/14 1654  LIPASE 29   No results for input(s): AMMONIA in the last 168 hours. CBC:  Recent Labs Lab 09/22/14 1654  09/24/14 1815 09/25/14 0350  09/26/14 0335 09/27/14 0502 09/28/14 0518  WBC 9.9  < > 8.9 8.3 8.6 8.3 9.5  NEUTROABS 8.6*  --   --   --   --   --   --   HGB 11.2*  < > 11.1* 10.9* 10.7* 10.4* 10.0*  HCT 34.7*  < > 36.1 35.2* 34.1* 33.0* 32.5*  MCV 89.2  < > 90.5 88.9 90.2 88.5 88.6  PLT 274  < > 268 250 249 244 265  < > = values in this interval not displayed. Cardiac Enzymes: No results for input(s): CKTOTAL, CKMB, CKMBINDEX, TROPONINI in the last 168 hours. BNP (last 3 results)  Recent Labs  02/26/14 1550 09/12/14 1448 09/22/14 1701  BNP 292.3* 360.2* 165.9*    ProBNP (last 3 results) No results for input(s): PROBNP in the last 8760 hours.  CBG:  Recent Labs Lab 09/23/14 0817 09/24/14 0745 09/25/14 0808 09/26/14 0754 09/27/14 0740  GLUCAP 101* 78 125* 90 90    Recent Results (from the past 240 hour(s))  AFB culture with smear     Status: None (Preliminary result)   Collection Time: 09/23/14 10:39 AM  Result Value Ref Range Status   Specimen Description ABDOMEN  Final   Special Requests NONE  Final   Acid Fast Smear   Final    NO ACID FAST BACILLI SEEN Performed at [facility]    Culture   Final    CULTURE WILL BE EXAMINED FOR 6 WEEKS BEFORE ISSUING A FINAL REPORT  Performed at [facility]    Report Status PENDING  Incomplete  Culture, body fluid-bottle     Status: None   Collection Time: 09/23/14 10:39 AM  Result Value Ref Range Status   Specimen Description FLUID ABDOMEN PERITONEAL  Final   Special Requests NONE  Final   Culture NO GROWTH 5 DAYS  Final   Report Status 09/28/2014 FINAL  Final  Gram stain     Status: None   Collection Time: 09/23/14 10:39 AM  Result Value Ref Range Status   Specimen Description FLUID ABDOMEN PERITONEAL  Final   Special Requests NONE  Final   Gram Stain   Final    ABUNDANT WBC PRESENT,BOTH PMN AND MONONUCLEAR NO ORGANISMS SEEN    Report Status 09/23/2014 FINAL  Final  Clostridium Difficile by PCR (not at [facility])     Status: None    Collection Time: 09/26/14 10:26 PM  Result Value Ref Range Status   C difficile by pcr NEGATIVE NEGATIVE Final     Studies: No results found.  Scheduled Meds: . allopurinol  100 mg Oral q morning - 10a  . bisoprolol  2.5 mg Oral Daily  . budesonide-formoterol  2 puff Inhalation BID  . cholecalciferol  1,000 Units Oral QHS  . cholecalciferol  2,000 Units Oral Daily  . docusate sodium  100 mg Oral BID  . feeding supplement (ENSURE ENLIVE)  237 mL Oral BID BM  . folic acid  1 mg Oral Daily  . montelukast  10 mg Oral QHS  . multivitamin with minerals  1 tablet Oral Daily  . pantoprazole  80 mg Oral QPM  . saccharomyces boulardii  250 mg Oral BID  . sodium chloride  250 mL Intravenous Once  . sodium chloride  3 mL Intravenous Q12H  . thiamine  100 mg Oral Daily   Continuous Infusions: . heparin 1,300 Units/hr (09/28/14 1853)     Time spent: 25 minutes    Dashonna Chagnon  [facility] Pager [phone]. If 7PM-7AM, please contact night-coverage at [email or web address], password [facility] 09/28/2014, 7:14 PM  LOS: 6 days

## 2014-09-28 NOTE — Progress Notes (Addendum)
Subjective: She is interested in pursuing biopsy and possible treatments.  Objective: Vital signs in last 24 hours: Temp:  [97.5 F (36.4 C)-98 F (36.7 C)] 97.5 F (36.4 C) (07/24 0518) Pulse Rate:  [67-88] 67 (07/24 0518) Resp:  [16-18] 16 (07/24 0518) BP: (80-97)/(50-62) 85/62 mmHg (07/24 0518) SpO2:  [94 %-98 %] 96 % (07/24 0820) Weight:  [71.895 kg (158 lb 8 oz)] 71.895 kg (158 lb 8 oz) (07/24 0200) Last BM Date: 09/27/14 (loose soft stools )  Intake/Output from previous day: 07/23 0701 - 07/24 0700 In: 2419.2 [P.O.:840; I.V.:1329.2; IV Piggyback:250] Out: 900 [Urine:650; Stool:250] Intake/Output this shift:    PE: General- In NAD Abdomen-firm, distended, no significant tenderness  Lab Results:   Recent Labs  09/27/14 0502 09/28/14 0518  WBC 8.3 9.5  HGB 10.4* 10.0*  HCT 33.0* 32.5*  PLT 244 265   BMET  Recent Labs  09/27/14 0502 09/28/14 0518  NA 133* 135  K 4.4 4.5  CL 98* 101  CO2 28 26  GLUCOSE 102* 87  BUN 42* 43*  CREATININE 2.46* 2.11*  CALCIUM 8.7* 8.9   PT/INR No results for input(s): LABPROT, INR in the last 72 hours. Comprehensive Metabolic Panel:    Component Value Date/Time   NA 135 09/28/2014 0518   NA 133* 09/27/2014 0502   K 4.5 09/28/2014 0518   K 4.4 09/27/2014 0502   CL 101 09/28/2014 0518   CL 98* 09/27/2014 0502   CO2 26 09/28/2014 0518   CO2 28 09/27/2014 0502   BUN 43* 09/28/2014 0518   BUN 42* 09/27/2014 0502   CREATININE 2.11* 09/28/2014 0518   CREATININE 2.46* 09/27/2014 0502   GLUCOSE 87 09/28/2014 0518   GLUCOSE 102* 09/27/2014 0502   CALCIUM 8.9 09/28/2014 0518   CALCIUM 8.7* 09/27/2014 0502   AST 20 09/23/2014 0441   AST 24 09/22/2014 1654   ALT 14 09/23/2014 0441   ALT 17 09/22/2014 1654   ALKPHOS 70 09/23/2014 0441   ALKPHOS 64 09/22/2014 1654   BILITOT 0.4 09/23/2014 0441   BILITOT 0.5 09/22/2014 1654   PROT 5.4* 09/23/2014 0441   PROT 5.9* 09/22/2014 1654   ALBUMIN 2.3* 09/23/2014 0441   ALBUMIN 2.5* 09/22/2014 1654     Studies/Results: No results found.  Anti-infectives: Anti-infectives    Start     Dose/Rate Route Frequency Ordered Stop   09/24/14 1600  cefTAZidime (FORTAZ) 1 g in dextrose 5 % 50 mL IVPB  Status:  Discontinued     1 g 100 mL/hr over 30 Minutes Intravenous Every 24 hours 09/24/14 1449 09/27/14 0747   09/24/14 1500  vancomycin (VANCOCIN) IVPB 1000 mg/200 mL premix  Status:  Discontinued     1,000 mg 200 mL/hr over 60 Minutes Intravenous Every 48 hours 09/24/14 1447 09/26/14 1054   09/22/14 2200  cefTRIAXone (ROCEPHIN) 2 g in dextrose 5 % 50 mL IVPB - Premix  Status:  Discontinued     2 g 100 mL/hr over 30 Minutes Intravenous Every 24 hours 09/22/14 2036 09/24/14 1431      Assessment Principal Problem:   Recurrent ascites with cytologies suggesting of malignant process. CA-125 is elevated suggesting ovarian primary but this has also been reported to be elevated in some cases of  gastric cancer; distal stomach is abnormally thick on CT-she is interested in pursuing biopsy Active Problems:   Hyperlipidemia   Atrial fibrillation   Chronic combined systolic and diastolic heart failure   Hypertension   CKD (chronic kidney disease),  stage III   CHF exacerbation   Ascites   Acute on chronic combined systolic and diastolic congestive heart failure    LOS: 6 days   Plan: Will ask GI to consider EGD and biopsy of distal stomach.  If this is unrevealing, consult IR for percutaneous biopsy of abnormal areas in omentum.   Sula Fetterly Lenna Sciara 09/28/2014

## 2014-09-28 NOTE — Clinical Social Work Note (Signed)
CSW contacted pt's daughter to provide bed offers.  Pts daughter want pt to go to [facility] as first pick and [facility] as second pick.  [facility] has not answered in system yet and [facility] is considering.    Week day CSW will follow up with pts daughter when pt is ready for discharge.  Dede Query, LCSW [facility] Worker - Weekend Coverage cell #: [phone]

## 2014-09-28 NOTE — Progress Notes (Addendum)
Patient with + 4 lb wt gain since yesterday. Patient c/o more discomfort in abdomen when getting oob. Wants to know when "some of the fluid can be taken off". BP remains low but patient remains asymptomatic.

## 2014-09-28 NOTE — Progress Notes (Signed)
ANTIBIOTIC CONSULT NOTE - INITIAL  Pharmacy Consult for Levaquin Indication: UTI  Allergies  Allergen Reactions  . Baby Powder [Methylbenzethonium] Shortness Of Breath  . Demerol [Meperidine] Hives  . Fexofenadine Other (See Comments)    dizziness  . Penicillins Hives    Tolerates Ceftriaxone.  Marland Kitchen Spiriva Handihaler [Tiotropium Bromide Monohydrate]     Caused chest pains    Patient Measurements: Height: 5\' 1"  (154.9 cm) Weight: 158 lb 8 oz (71.895 kg) IBW/kg (Calculated) : 47.8  Vital Signs: Temp: 98.5 F (36.9 C) (07/24 1410) Temp Source: Oral (07/24 1410) BP: 79/44 mmHg (07/24 1410) Pulse Rate: 81 (07/24 1410) Intake/Output from previous day: 07/23 0701 - 07/24 0700 In: 2419.2 [P.O.:840; I.V.:1329.2; IV Piggyback:250] Out: 900 [Urine:650; Stool:250] Intake/Output from this shift:    Labs:  Recent Labs  09/26/14 0335 09/27/14 0502 09/28/14 0518  WBC 8.6 8.3 9.5  HGB 10.7* 10.4* 10.0*  PLT 249 244 265  CREATININE  --  2.46* 2.11*   Estimated Creatinine Clearance: 16.7 mL/min (by C-G formula based on Cr of 2.11). No results for input(s): VANCOTROUGH, VANCOPEAK, VANCORANDOM, GENTTROUGH, GENTPEAK, GENTRANDOM, TOBRATROUGH, TOBRAPEAK, TOBRARND, AMIKACINPEAK, AMIKACINTROU, AMIKACIN in the last 72 hours.   Microbiology: Recent Results (from the past 720 hour(s))  Urine culture     Status: None   Collection Time: 09/12/14  2:56 PM  Result Value Ref Range Status   Specimen Description URINE, CLEAN CATCH  Final   Special Requests NONE  Final   Culture   Final    >=100,000 COLONIES/mL ESCHERICHIA COLI Performed at [facility]    Report Status 09/15/2014 FINAL  Final   Organism ID, Bacteria ESCHERICHIA COLI  Final      Susceptibility   Escherichia coli - MIC*    AMPICILLIN <=2 SENSITIVE Sensitive     CEFAZOLIN <=4 SENSITIVE Sensitive     CEFTRIAXONE <=1 SENSITIVE Sensitive     CIPROFLOXACIN >=4 RESISTANT Resistant     GENTAMICIN <=1 SENSITIVE Sensitive      IMIPENEM <=0.25 SENSITIVE Sensitive     NITROFURANTOIN <=16 SENSITIVE Sensitive     TRIMETH/SULFA <=20 SENSITIVE Sensitive     AMPICILLIN/SULBACTAM <=2 SENSITIVE Sensitive     PIP/TAZO <=4 SENSITIVE Sensitive     * >=100,000 COLONIES/mL ESCHERICHIA COLI  Culture, body fluid-bottle     Status: None   Collection Time: 09/18/14 12:33 PM  Result Value Ref Range Status   Specimen Description FLUID ABDOMEN PERITONEAL  Final   Special Requests BAA 5MLS  Final   Culture NO GROWTH 5 DAYS  Final   Report Status 09/23/2014 FINAL  Final  Gram stain     Status: None   Collection Time: 09/18/14 12:33 PM  Result Value Ref Range Status   Specimen Description FLUID ABDOMEN PERITONEAL  Final   Special Requests NONE  Final   Gram Stain   Final    MODERATE WBC PRESENT, PREDOMINANTLY PMN NO ORGANISMS SEEN    Report Status 09/19/2014 FINAL  Final  AFB culture with smear     Status: None (Preliminary result)   Collection Time: 09/23/14 10:39 AM  Result Value Ref Range Status   Specimen Description ABDOMEN  Final   Special Requests NONE  Final   Acid Fast Smear   Final    NO ACID FAST BACILLI SEEN Performed at [facility]    Culture   Final    CULTURE WILL BE EXAMINED FOR 6 WEEKS BEFORE ISSUING A FINAL REPORT Performed at [facility]  Report Status PENDING  Incomplete  Culture, body fluid-bottle     Status: None   Collection Time: 09/23/14 10:39 AM  Result Value Ref Range Status   Specimen Description FLUID ABDOMEN PERITONEAL  Final   Special Requests NONE  Final   Culture NO GROWTH 5 DAYS  Final   Report Status 09/28/2014 FINAL  Final  Gram stain     Status: None   Collection Time: 09/23/14 10:39 AM  Result Value Ref Range Status   Specimen Description FLUID ABDOMEN PERITONEAL  Final   Special Requests NONE  Final   Gram Stain   Final    ABUNDANT WBC PRESENT,BOTH PMN AND MONONUCLEAR NO ORGANISMS SEEN    Report Status 09/23/2014 FINAL  Final  Clostridium  Difficile by PCR (not at [facility])     Status: None   Collection Time: 09/26/14 10:26 PM  Result Value Ref Range Status   C difficile by pcr NEGATIVE NEGATIVE Final    Medical History: Past Medical History  Diagnosis Date  . Hypertension   . Hyperlipidemia   . COPD (chronic obstructive pulmonary disease)   . Osteoporosis   . H/O hiatal hernia   . Chronic back pain   . CAD (coronary artery disease)     LHC 9/05:  pLAD less than 20%, D1 20-30%, ostial RCA 40-50%, proximal RCA 20%, EF 60%.  . C. difficile diarrhea   . Gout   . Atrial fibrillation     a. failed DCCV => b. amiodarone added => converted to NSR on Amiodarone;  c. Xarelto d/c'd 2/2 falling (wrist and hip Fx in 1/14)  . Carotid stenosis     s/p bilat CEA  . C. difficile colitis     10/2011;  11/2011  . Salmonella enteritis 08/2011    c/b septic shock  . Hx of echocardiogram     a. Echocardiogram 09/29/11: Mild LVH, EF 45-50%, diffuse HK, mild to moderate aortic stenosis, mean gradient 12 mmHg, moderate MAC, mild MR, moderate LAE, moderate RAE, question small secundum ASD with left to right flow not seen in 4 chamber view, PASP 35-39 (mild pulmonary hypertension). ;  b.  TEE on 10/04/11: Mild LVH, EF 55%, mild MR, no defect or PFO  . Pneumonia 11/2101  . PONV (postoperative nausea and vomiting)   . CHF (congestive heart failure)   . Chronic kidney disease   . GERD (gastroesophageal reflux disease)   . Hepatitis   . Anemia   . Hx of echocardiogram     Echo (09/2013): EF 55%, normal wall motion, mild aortic stenosis (mean 12 mm Hg), MAC, mild MR, mild LAE, mild PI, PASP 30 mm Hg  . Hx of cardiovascular stress test     Lexiscan Myoview (7/15):  No ischemia or infarct, EF 50%, Low Risk  . Arthritis     Neck  . PAF (paroxysmal atrial fibrillation)     Medications:  Scheduled:  . allopurinol  100 mg Oral q morning - 10a  . bisoprolol  2.5 mg Oral Daily  . budesonide-formoterol  2 puff Inhalation BID  . cholecalciferol  1,000  Units Oral QHS  . cholecalciferol  2,000 Units Oral Daily  . docusate sodium  100 mg Oral BID  . feeding supplement (ENSURE ENLIVE)  237 mL Oral BID BM  . folic acid  1 mg Oral Daily  . montelukast  10 mg Oral QHS  . multivitamin with minerals  1 tablet Oral Daily  . pantoprazole  80 mg Oral  QPM  . saccharomyces boulardii  250 mg Oral BID  . sodium chloride  250 mL Intravenous Once  . sodium chloride  3 mL Intravenous Q12H  . thiamine  100 mg Oral Daily   Infusions:  . heparin 1,300 Units/hr (09/28/14 1853)   Assessment: Patient already known to pharmacy for dosing of IV heparin and previous antibiotics. Now to start levaquin for UTI per Md orders. Afebrile. WBC WNL, SCr elevated at 2.11 with est CrCl of 17 ml/min  Goal of Therapy:  treatment of UTI  Plan:  Levaquin 500mg  PO x 1 then 250mg  PO q48 for CrCl 10-19 ml/min   Adrian Saran, PharmD, BCPS Pager [phone] 09/28/2014 7:24 PM

## 2014-09-28 NOTE — Progress Notes (Signed)
Discussed case with Dr. Zella Richer.  Patient has ascites which is most likely malignant.  Slightly elevated Ca-125. Significant gastric wall thickening on most recent CT scan.  Patient is interested in treatment, should definitive diagnosis be made.  Prior to consideration of percutaneous omental biopsy and/or laparoscopy (which is frequently complicated by significant ascites leak through laparoscopic port sites), will pursue endoscopy tomorrow for further investigation.

## 2014-09-28 NOTE — Progress Notes (Signed)
PHARMACY BRIEF NOTE: Heparin  Pharmacy Consult for Heparin Indication: atrial fibrillation (Eliquis on hold)    Recent Labs  09/26/14 0335 09/26/14 1405 09/26/14 2156 09/27/14 0502 09/27/14 1340 09/28/14 0518 09/28/14 1600  HGB 10.7*  --   --  10.4*  --  10.0*  --   HCT 34.1*  --   --  33.0*  --  32.5*  --   PLT 249  --   --  244  --  265  --   APTT 65* 46* 55* 72*  --   --   --   HEPARINUNFRC 0.69 0.63 0.42 0.45 0.39 0.32 0.31  CREATININE  --   --   --  2.46*  --  2.11*  --    Medications:  Infusions:  . heparin 1,300 Units/hr (09/28/14 0930)    Assessment: 7 yoF admitted on 7/18 and pharmacy consulted to dose heparin for Afib while Apixaban was on hold for paracentesis.  See previous note today for full details.  Heparin level 0.31, remains therapeutic but decreasing despite rate increase earlier today.  (rate increase ordered at 0730, rate increase not made until 0930)  RN reports no bruising/bleeding or adverse events.  No IV infusion issues or interruptions.   Goal of Therapy:  Heparin level 0.3-0.7 units/ml Monitor platelets by anticoagulation protocol: Yes   Plan:   Continue heparin IV infusion at 1300 units/hr  Recheck Heparin level in 8 hours to confirm therapeutic level.  Daily heparin level and CBC  Follow up plans for biopsy/laparoscopy.  Gretta Arab PharmD, BCPS Pager [phone] 09/28/2014 5:17 PM

## 2014-09-28 NOTE — Progress Notes (Signed)
IP PROGRESS NOTE  Subjective:   Patient feels relatively well without any major complaints. She reports abdominal distention and slight discomfort. Is not reporting any nausea or vomiting.   Objective:  Vital signs in last 24 hours: Temp:  [97.5 F (36.4 C)-98 F (36.7 C)] 97.5 F (36.4 C) (07/24 0518) Pulse Rate:  [67-88] 67 (07/24 0518) Resp:  [16-18] 16 (07/24 0518) BP: (80-97)/(50-62) 85/62 mmHg (07/24 0518) SpO2:  [94 %-98 %] 97 % (07/24 0518) Weight:  [154 lb 1.6 oz (69.899 kg)-158 lb 8 oz (71.895 kg)] 158 lb 8 oz (71.895 kg) (07/24 0200) Weight change: 1.6 oz (0.045 kg) Last BM Date: 09/27/14 (loose soft stools )  Intake/Output from previous day: 07/23 0701 - 07/24 0700 In: 2419.2 [P.O.:840; I.V.:1329.2; IV Piggyback:250] Out: 900 [Urine:650; Stool:250]  Mouth: mucous membranes moist, pharynx normal without lesions Resp: clear to auscultation bilaterally Cardio: regular rate and rhythm, S1, S2 normal, no murmur, click, rub or gallop GI: soft, non-tender; bowel sounds normal; no masses,  no organomegaly Extremities: extremities normal, atraumatic, no cyanosis or edema    Lab Results:  Recent Labs  09/27/14 0502 09/28/14 0518  WBC 8.3 9.5  HGB 10.4* 10.0*  HCT 33.0* 32.5*  PLT 244 265    BMET  Recent Labs  09/27/14 0502 09/28/14 0518  NA 133* 135  K 4.4 4.5  CL 98* 101  CO2 28 26  GLUCOSE 102* 87  BUN 42* 43*  CREATININE 2.46* 2.11*  CALCIUM 8.7* 8.9    Studies/Results: No results found.  Medications: I have reviewed the patient's current medications.  Assessment/Plan:  81 year old woman with the following issues:  1. Malignant ascites etiology likely from a intra-abdominal malignancy of a GYN etiology versus GI source. It is imperative to obtain a biopsy to make the diagnosis and really have any meaningful discussion regarding prognosis and treatments. If we're dealing with ovarian cancer, there are a lot of palliative treatments that she  can tolerate. These include hormone therapy and mild chemotherapy.  She is open to the idea of having a biopsy in establishing the diagnosis. She is open to the idea of considering chemotherapy if offered her palliation of symptoms.  I agree with obtaining another biopsy percutaneously if possible. The biopsy might be needed if no tissue diagnosis have been made.  Clinical status deteriorates and kidney function continues to be an issue, discussion about hospice then have to take place and we can certainly pursue supportive care only as an option.   2. Renal insufficiency: Her creatinine have improved slightly overnight with a reasonable urine output.  Please call with any questions regarding this pleasant woman.   LOS: 6 days   [facility] 09/28/2014, 7:53 AM

## 2014-09-28 NOTE — Progress Notes (Signed)
ANTICOAGULATION CONSULT NOTE - Follow Up Consult  Pharmacy Consult for Heparin Indication: atrial fibrillation (Eliquis on hold)  Allergies  Allergen Reactions  . Baby Powder [Methylbenzethonium] Shortness Of Breath  . Demerol [Meperidine] Hives  . Fexofenadine Other (See Comments)    dizziness  . Penicillins Hives    Tolerates Ceftriaxone.  Marland Kitchen Spiriva Handihaler [Tiotropium Bromide Monohydrate]     Caused chest pains    Patient Measurements: Height: 5\' 1"  (154.9 cm) Weight: 158 lb 8 oz (71.895 kg) IBW/kg (Calculated) : 47.8 Heparin Dosing Weight: 63 kg  Vital Signs: Temp: 97.5 F (36.4 C) (07/24 0518) Temp Source: Oral (07/24 0518) BP: 85/62 mmHg (07/24 0518) Pulse Rate: 67 (07/24 0518)  Labs:  Recent Labs  09/26/14 0335 09/26/14 1405 09/26/14 2156 09/27/14 0502 09/27/14 1340 09/28/14 0518  HGB 10.7*  --   --  10.4*  --  10.0*  HCT 34.1*  --   --  33.0*  --  32.5*  PLT 249  --   --  244  --  265  APTT 65* 46* 55* 72*  --   --   HEPARINUNFRC 0.69 0.63 0.42 0.45 0.39 0.32  CREATININE  --   --   --  2.46*  --  2.11*    Estimated Creatinine Clearance: 16.7 mL/min (by C-G formula based on Cr of 2.11).   Medications:  Infusions:  . sodium chloride 1,000 mL (09/28/14 0538)  . heparin 1,200 Units/hr (09/27/14 2017)    Assessment: 89 yoF admitted on 7/18 for SBP and emesis.  PMH includes apixaban PTA for hx PAF, CAD s/p bilateral CEA, chronic diastolic CHF (EF 70-78%), and ascites.  Anticoagulation was held for paracentesis on 7/19. Pharmacy consulted to start IV heparin after paracentesis completed in case patient required further procedures.   Last dose apixaban 2.5mg  on 7/18 @ 0930.  Poor renal function with CrCl < 30 ml/min.  Today, 09/28/2014:  Heparin level 0.32, remains therapeutic but decreasing  CBC: Hgb 10 (low/stable), and Plt 265  Renal: SCr slightly improved to 2.11 with CrCl ~ 16 ml/min   Goal of Therapy:  Heparin level 0.3-0.7  units/ml Monitor platelets by anticoagulation protocol: Yes   Plan:   Increase to heparin IV infusion at 1300 units/hr  Recheck heparin level in 8 hours  Daily heparin level and CBC  Continue to monitor H&H and platelets  Follow up long-term anticoag plan after invasive procedures completed.  Note, Patients with SCr > 2.5 or CrCl < 25 ml/min were excluded from clinical trials.  In patients with severe or end-stage chronic kidney disease, warfarin remains the anticoagulant of choice.   Gretta Arab PharmD, BCPS Pager [phone] 09/28/2014 7:08 AM

## 2014-09-29 ENCOUNTER — Inpatient Hospital Stay (HOSPITAL_COMMUNITY): Payer: Medicare Other | Admitting: Anesthesiology

## 2014-09-29 ENCOUNTER — Encounter (HOSPITAL_COMMUNITY): Payer: Self-pay

## 2014-09-29 ENCOUNTER — Encounter (HOSPITAL_COMMUNITY)
Admission: EM | Disposition: A | Discharge status: Skilled Nursing Facility | Payer: Self-pay | Source: Home / Self Care | Attending: Internal Medicine

## 2014-09-29 DIAGNOSIS — C169 Malignant neoplasm of stomach, unspecified: Secondary | ICD-10-CM | POA: Diagnosis present

## 2014-09-29 DIAGNOSIS — R6881 Early satiety: Secondary | ICD-10-CM

## 2014-09-29 HISTORY — PX: ESOPHAGOGASTRODUODENOSCOPY: SHX5428

## 2014-09-29 LAB — GLUCOSE, CAPILLARY
GLUCOSE-CAPILLARY: 88 mg/dL (ref 65–99)
GLUCOSE-CAPILLARY: 86 mg/dL (ref 65–99)

## 2014-09-29 LAB — URINE MICROSCOPIC-ADD ON

## 2014-09-29 LAB — CBC
HCT: 32.8 % — ABNORMAL LOW (ref 36.0–46.0)
HEMOGLOBIN: 10.5 g/dL — AB (ref 12.0–15.0)
MCH: 28.1 pg (ref 26.0–34.0)
MCHC: 32 g/dL (ref 30.0–36.0)
MCV: 87.7 fL (ref 78.0–100.0)
PLATELETS: 256 10*3/uL (ref 150–400)
RBC: 3.74 MIL/uL — ABNORMAL LOW (ref 3.87–5.11)
RDW: 15.1 % (ref 11.5–15.5)
WBC: 9.3 10*3/uL (ref 4.0–10.5)

## 2014-09-29 LAB — COMPREHENSIVE METABOLIC PANEL
ALT: 15 U/L (ref 14–54)
ANION GAP: 6 (ref 5–15)
AST: 21 U/L (ref 15–41)
Albumin: 1.8 g/dL — ABNORMAL LOW (ref 3.5–5.0)
Alkaline Phosphatase: 51 U/L (ref 38–126)
BUN: 45 mg/dL — AB (ref 6–20)
CHLORIDE: 102 mmol/L (ref 101–111)
CO2: 25 mmol/L (ref 22–32)
Calcium: 8.7 mg/dL — ABNORMAL LOW (ref 8.9–10.3)
Creatinine, Ser: 1.95 mg/dL — ABNORMAL HIGH (ref 0.44–1.00)
GFR calc non Af Amer: 22 mL/min — ABNORMAL LOW (ref >60)
GFR, EST AFRICAN AMERICAN: 25 mL/min — AB (ref >60)
GLUCOSE: 85 mg/dL (ref 65–99)
Potassium: 4.7 mmol/L (ref 3.5–5.1)
Sodium: 133 mmol/L — ABNORMAL LOW (ref 135–145)
Total Bilirubin: 0.3 mg/dL (ref 0.3–1.2)
Total Protein: 5.1 g/dL — ABNORMAL LOW (ref 6.5–8.1)

## 2014-09-29 LAB — URINALYSIS, ROUTINE W REFLEX MICROSCOPIC
Glucose, UA: NEGATIVE mg/dL
Ketones, ur: NEGATIVE mg/dL
Leukocytes, UA: NEGATIVE
Nitrite: NEGATIVE
PH: 5.5 (ref 5.0–8.0)
PROTEIN: NEGATIVE mg/dL
Specific Gravity, Urine: 1.023 (ref 1.005–1.030)
Urobilinogen, UA: 0.2 mg/dL (ref 0.0–1.0)

## 2014-09-29 LAB — HEPARIN LEVEL (UNFRACTIONATED)
Heparin Unfractionated: 0.23 IU/mL — ABNORMAL LOW (ref 0.30–0.70)
Heparin Unfractionated: 0.43 IU/mL (ref 0.30–0.70)

## 2014-09-29 SURGERY — EGD (ESOPHAGOGASTRODUODENOSCOPY)
Anesthesia: Monitor Anesthesia Care | Laterality: Left

## 2014-09-29 MED ORDER — PROPOFOL 500 MG/50ML IV EMUL
INTRAVENOUS | Status: DC | PRN
  Administered 2014-09-29: 30 mg via INTRAVENOUS

## 2014-09-29 MED ORDER — PROPOFOL 10 MG/ML IV BOLUS
INTRAVENOUS | Status: AC
  Filled 2014-09-29: qty 20

## 2014-09-29 MED ORDER — HEPARIN (PORCINE) IN NACL 100-0.45 UNIT/ML-% IJ SOLN
1450.0000 [IU]/h | INTRAMUSCULAR | Status: DC
  Administered 2014-09-29 – 2014-10-01 (×3): 1450 [IU]/h via INTRAVENOUS
  Filled 2014-09-29 (×6): qty 250

## 2014-09-29 MED ORDER — SODIUM CHLORIDE 0.9 % IV BOLUS (SEPSIS)
250.0000 mL | Freq: Once | INTRAVENOUS | Status: AC
  Administered 2014-09-29: 250 mL via INTRAVENOUS

## 2014-09-29 MED ORDER — LACTATED RINGERS IV SOLN
INTRAVENOUS | Status: DC | PRN
  Administered 2014-09-29: 13:00:00 via INTRAVENOUS

## 2014-09-29 MED ORDER — CLOTRIMAZOLE 10 MG MT TROC
10.0000 mg | Freq: Every day | OROMUCOSAL | Status: DC
  Administered 2014-09-29 – 2014-10-02 (×11): 10 mg via ORAL
  Filled 2014-09-29 (×19): qty 1

## 2014-09-29 MED ORDER — EPHEDRINE SULFATE 50 MG/ML IJ SOLN
INTRAMUSCULAR | Status: DC | PRN
  Administered 2014-09-29 (×2): 10 mg via INTRAVENOUS

## 2014-09-29 MED ORDER — PHENYLEPHRINE HCL 10 MG/ML IJ SOLN
INTRAMUSCULAR | Status: DC | PRN
  Administered 2014-09-29 (×3): 80 ug via INTRAVENOUS

## 2014-09-29 MED ORDER — HEPARIN (PORCINE) IN NACL 100-0.45 UNIT/ML-% IJ SOLN
1450.0000 [IU]/h | INTRAMUSCULAR | Status: DC
  Filled 2014-09-29 (×2): qty 250

## 2014-09-29 MED ORDER — PROPOFOL INFUSION 10 MG/ML OPTIME
INTRAVENOUS | Status: DC | PRN
  Administered 2014-09-29: 75 ug/kg/min via INTRAVENOUS

## 2014-09-29 NOTE — Op Note (Signed)
[facility] [place], [id number]   ENDOSCOPY PROCEDURE REPORT  PATIENT: [name] [name], [name] [name]  MR#: [id number] BIRTHDATE: 05-24-1923 , 89  yrs. old GENDER: female ENDOSCOPIST:Denaisha Swango Oletta Lamas, MD REFERRED BY: Jackolyn Confer, M.D.  Zola Button, M.D. PROCEDURE DATE:  Oct 04, 2014 PROCEDURE:   EGD w/ biopsy ASA CLASS:    Class IV INDICATIONS: patient has recent weight loss nausea and vomiting with CT scan showing markedly thickened antrum.  She has ascites questionably malignant.  She has been on heparin which is been on hold for several hours.Marland Kitchen MEDICATION: Propofol 100 mg IV TOPICAL ANESTHETIC:   none  DESCRIPTION OF PROCEDURE:   After the risks and benefits of the procedure were explained, informed consent was obtained.  The endoscope was introduced through the mouth  and advanced to the second portion of the duodenum .  The instrument was slowly withdrawn as the mucosa was fully examined. Estimated blood loss is zero unless otherwise noted in this procedure report.    ESOPHAGUS: Fungal esophagitis throughout the entire esophagus. Marked thickening ulceration of the entire gastric antrum.  I was unable to pass into the duodenum.  It was friable and consistent with diffuse gastric carcinoma.  Multiple biopsies were obtained. A moderate amount of liquid food material was in the proximal stomach.    Retroflexion was not performed.    The scope was then withdrawn from the patient and the procedure completed.  COMPLICATIONS: There were no immediate complications.  ENDOSCOPIC IMPRESSION: 1.   Fungal esophagitis throughout the entire esophagus 2.   Marked thickening ulceration of the entire gastric antrum.  I was unable to pass into the duodenum.  It was friable and consistent with diffuse gastric carcinoma.  Multiple biopsies were obtained.  A moderate amount of liquid food material was in the proximal stomach . Consistent with gastric carcinoma  with obstruction RECOMMENDATIONS: Will give the patient clear liquids and will begin topical treatment for her fungal esophagitis.  Will await pathology results.   _______________________________ Lorrin MaisLaurence Spates, MD October 04, 2014 1:25 PM     cc:  CPT CODES: ICD CODES:  The ICD and CPT codes recommended by this software are interpretations from the data that the clinical staff has captured with the software.  The verification of the translation of this report to the ICD and CPT codes and modifiers is the sole responsibility of the health care institution and practicing physician where this report was generated.  [employer] will not be held responsible for the validity of the ICD and CPT codes included on this report.  AMA assumes no liability for data contained or not contained herein. CPT is a Designer, television/film set of the [employer].

## 2014-09-29 NOTE — Progress Notes (Signed)
Nutrition Follow-up  DOCUMENTATION CODES:   Non-severe (moderate) malnutrition in context of chronic illness, Obesity unspecified  INTERVENTION:   Continue Ensure Enlive po BID, each supplement provides 350 kcal and 20 grams of protein Encourage PO intake RD to continue to monitor  NUTRITION DIAGNOSIS:   Malnutrition related to chronic illness as evidenced by mild depletion of body fat, mild depletion of muscle mass.  Ongoing.  GOAL:   Patient will meet greater than or equal to 90% of their needs  Not meeting currently.  MONITOR:   PO intake, Supplement acceptance, Labs, Weight trends, Skin, I & O's   ASSESSMENT:   81 y.o. female with history of CKD HTN COPD ascites Atrial Fibrillation on Eliquis presents with increased abdominal distension. Patient was seen last week in the office and was noted to have ascites.   Pt reports not feeling that hungry at this time. Pt states she is to have another paracentesis procedure today. Pt had EGD this morning in which she was NPO. Pt has not eaten yet today.  Pt states that she likes the Ensure supplements and will begin to drink them again once her procedures are over.  Labs reviewed: Low Na Elevated BUN & Creatinine  Diet Order:  Diet 2 gram sodium Room service appropriate?: Yes; Fluid consistency:: Thin  Skin:  Reviewed, no issues  Last BM:  7/24  Height:   Ht Readings from Last 1 Encounters:  09/22/14 5\' 1"  (1.549 m)    Weight:   Wt Readings from Last 1 Encounters:  09/29/14 162 lb 11.2 oz (73.8 kg)    Ideal Body Weight:  47.7 kg  Wt Readings from Last 10 Encounters:  09/29/14 162 lb 11.2 oz (73.8 kg)  06/24/14 171 lb 15.3 oz (78 kg)  06/06/14 175 lb (79.379 kg)  04/28/14 172 lb (78.019 kg)  04/14/14 180 lb (81.647 kg)  03/31/14 176 lb (79.833 kg)  03/04/14 178 lb (80.74 kg)  02/28/14 180 lb (81.647 kg)  11/15/13 173 lb (78.472 kg)  09/26/13 172 lb (78.019 kg)    BMI:  Body mass index is 30.76  kg/(m^2).  Estimated Nutritional Needs:   Kcal:  1300-1500  Protein:  70-80g  Fluid:  1.5L/day  EDUCATION NEEDS:   No education needs identified at this time  Clayton Bibles, MS, RD, LDN Pager: [phone] After Hours Pager: [phone]

## 2014-09-29 NOTE — H&P (View-Only) (Signed)
Discussed case with Dr. Zella Richer.  Patient has ascites which is most likely malignant.  Slightly elevated Ca-125. Significant gastric wall thickening on most recent CT scan.  Patient is interested in treatment, should definitive diagnosis be made.  Prior to consideration of percutaneous omental biopsy and/or laparoscopy (which is frequently complicated by significant ascites leak through laparoscopic port sites), will pursue endoscopy tomorrow for further investigation.

## 2014-09-29 NOTE — Anesthesia Preprocedure Evaluation (Addendum)
Anesthesia Evaluation  Patient identified by MRN, date of birth, ID band Patient awake    Reviewed: Allergy & Precautions, NPO status , Patient's Chart, lab work & pertinent test results  History of Anesthesia Complications (+) PONV and history of anesthetic complications  Airway Mallampati: II  TM Distance: >3 FB Neck ROM: Full    Dental  (+) Teeth Intact   Pulmonary shortness of breath and Long-Term Oxygen Therapy, COPD COPD inhaler and oxygen dependent, former smoker,    + decreased breath sounds      Cardiovascular hypertension, Pt. on medications + CAD, + Peripheral Vascular Disease and +CHF + dysrhythmias Atrial Fibrillation + Valvular Problems/Murmurs AS and AI Rhythm:Irregular + Systolic murmurs    Neuro/Psych negative neurological ROS  negative psych ROS   GI/Hepatic hiatal hernia, GERD-  Medicated,(+) Hepatitis -  Endo/Other    Renal/GU CRFRenal disease     Musculoskeletal  (+) Arthritis -,   Abdominal   Peds  Hematology  (+) anemia , Transitioned to heparin gtt   Anesthesia Other Findings   Reproductive/Obstetrics                            Anesthesia Physical Anesthesia Plan  ASA: III  Anesthesia Plan: MAC   Post-op Pain Management:    Induction: Intravenous  Airway Management Planned: Nasal Cannula  Additional Equipment: None  Intra-op Plan:   Post-operative Plan:   Informed Consent: I have reviewed the patients History and Physical, chart, labs and discussed the procedure including the risks, benefits and alternatives for the proposed anesthesia with the patient or authorized representative who has indicated his/her understanding and acceptance.   Dental advisory given  Plan Discussed with: CRNA and Surgeon  Anesthesia Plan Comments:         Anesthesia Quick Evaluation

## 2014-09-29 NOTE — Progress Notes (Signed)
IP PROGRESS NOTE  Subjective:   No new complaints noted. She continues to have abdominal distention and early satiety. She is nothing by mouth this morning current EGD.  Objective:  Vital signs in last 24 hours: Temp:  [97.4 F (36.3 C)-98.5 F (36.9 C)] 97.4 F (36.3 C) (07/25 0501) Pulse Rate:  [81-95] 95 (07/25 0501) Resp:  [16-20] 16 (07/25 0501) BP: (79-97)/(44-61) 97/51 mmHg (07/25 0501) SpO2:  [96 %-100 %] 98 % (07/25 0501) Weight:  [162 lb 11.2 oz (73.8 kg)] 162 lb 11.2 oz (73.8 kg) (07/25 0500) Weight change: 8 lb 9.6 oz (3.901 kg) Last BM Date: 09/28/14  Intake/Output from previous day: 07/24 0701 - 07/25 0700 In: 3203.7 [P.O.:1060; I.V.:307.7] Out: 320 [Urine:320]  Mouth: mucous membranes moist, pharynx normal without lesions Resp: clear to auscultation bilaterally Cardio: regular rate and rhythm, S1, S2 normal, no murmur, click, rub or gallop GI: Slightly distended with sifting dullness. Good bowel sounds. No rebound or guarding. Extremities: extremities normal, atraumatic, no cyanosis or edema    Lab Results:  Recent Labs  09/28/14 0518 09/29/14 0204  WBC 9.5 9.3  HGB 10.0* 10.5*  HCT 32.5* 32.8*  PLT 265 256    BMET  Recent Labs  09/28/14 0518 09/29/14 0204  NA 135 133*  K 4.5 4.7  CL 101 102  CO2 26 25  GLUCOSE 87 85  BUN 43* 45*  CREATININE 2.11* 1.95*  CALCIUM 8.9 8.7*    Studies/Results: No results found.  Medications: I have reviewed the patient's current medications.  Assessment/Plan:  81 year old woman with the following issues:  1. Malignant ascites etiology likely from a intra-abdominal malignancy of a GYN etiology versus GI source. It is imperative to obtain a biopsy to make the diagnosis and really have any meaningful discussion regarding prognosis and treatments. She is willing to proceed with biopsy and possible treatment. She is scheduled for an EGD today and possibly percutaneous biopsy if needed to. I will await the  results of the biopsy before any further discussions regarding treatment options.  2. Renal insufficiency: Her creatinine have improved slightly overnight with a reasonable urine output.  3. Ascites: She might need therapeutic paracentesis in the near future.     LOS: 7 days   SHADAD,FIRAS 09/29/2014, 8:05 AM

## 2014-09-29 NOTE — Progress Notes (Signed)
ANTICOAGULATION CONSULT NOTE - Follow Up Consult  Pharmacy Consult for Heparin Indication: atrial fibrillation  Allergies  Allergen Reactions  . Baby Powder [Methylbenzethonium] Shortness Of Breath  . Demerol [Meperidine] Hives  . Fexofenadine Other (See Comments)    dizziness  . Penicillins Hives    Tolerates Ceftriaxone.  Marland Kitchen Spiriva Handihaler [Tiotropium Bromide Monohydrate]     Caused chest pains    Patient Measurements: Height: 5\' 1"  (154.9 cm) Weight: 158 lb 8 oz (71.895 kg) IBW/kg (Calculated) : 47.8 Heparin Dosing Weight:   Vital Signs: Temp: 97.6 F (36.4 C) (07/24 2309) Temp Source: Oral (07/24 2309) BP: 83/61 mmHg (07/24 2309) Pulse Rate: 86 (07/24 2309)  Labs:  Recent Labs  09/26/14 1405 09/26/14 2156  09/27/14 0502  09/28/14 0518 09/28/14 1600 09/29/14 0204  HGB  --   --   < > 10.4*  --  10.0*  --  10.5*  HCT  --   --   --  33.0*  --  32.5*  --  32.8*  PLT  --   --   --  244  --  265  --  256  APTT 46* 55*  --  72*  --   --   --   --   HEPARINUNFRC 0.63 0.42  --  0.45  < > 0.32 0.31 0.23*  CREATININE  --   --   --  2.46*  --  2.11*  --  1.95*  < > = values in this interval not displayed.  Estimated Creatinine Clearance: 18.1 mL/min (by C-G formula based on Cr of 1.95).   Medications:  Infusions:  . heparin      Assessment: Patient with low heparin level again.  No heparin issues per RN.  Goal of Therapy:  Heparin level 0.3-0.7 units/ml Monitor platelets by anticoagulation protocol: Yes   Plan:  Increase heparin to 1450 units/hr Recheck level at [address] [place] 09/29/2014,5:02 AM

## 2014-09-29 NOTE — Progress Notes (Signed)
[facility] PROGRESS NOTE  [name] [name] RSW:[id number] DOB: Dec 17, 1923 DOA: 09/22/2014 PCP: [name]  Brief narrative: 09/24/2014: I have seen and examined [name] [name] at bedside and reviewed her chart including off service note by Dr. Wynelle Cleveland. [facility]. [name] [name] is a pleasant 81 y.o. female with CAD, HTN, HLD, COPD, carotid artery disease s/p bilateral CEA, atrial fibrillation and chronic diastolic CHF, ascites (unknown cause), who presented with increased abdominal distension, now status post paracentesis twice with concern for malignant ascites, final cytology results pending. She is on treatment for presumed SBP although fluid cultures have remained negative. Patient also had 2-D echocardiogram which showed a decrease in ejection fraction from 50% in 2015 to 30-35% this time around. She complains of pleuritic chest pain, and it is possible that she has healthcare associated pneumonia given she was hospitalized a couple of months ago. Will therefore change antibiotics to vancomycin/cefepime to cover for nosocomial organisms, consult cardiology regarding low ejection fraction, ?further workup and follow GI recommendations. Otherwise she complains of cough and abdominal distention but leg edema has resolved. 09/25/2014: Appreciate GI/cardiology. CA-125 elevated. More concern for ovarian CA. Shared these concerns with patient and her daughter. Await final pathology results. Meanwhile, will continue current antibiotic and follow GI/cardiology recommendations. 09/26/2014: Septic workup remains negative. Discussed with Dr. Gerilyn Nestle. [place]. Appreciate help. Patient likely has malignant ascites from ovarian CA. Will consult oncology for help with further diagnostic pathway, discontinue vancomycin and follow GI/cardiology recommendations. Patient on raloxifene, not sure of indication. Will discontinue for now given cardiac issues. 09/27/2014: Appreciate cardiology/general  surgery. Spoke with Dr. Zella Richer. I agree with thought process regarding whether patient would eventually be a candidate for chemotherapy. At this point her comorbidities militate against aggressive treatment of any type of cancer, is patient with renal insufficiency/CHF at 81 years of age. We'll follow-up with patient and family regarding decision on laparoscopic biopsy. Meanwhile, patient is worsening renal insufficiency which is concerning. Her BP is borderline low which may be worsening the renal insufficiency as well. We'll gently rehydrate and discontinue antibiotics 09/28/2014: Appreciate general surgery/GI. Noted plans for EGD in a.m. Patient's blood pressure borderline low-no evidence of bleeding, urinary retention present, BP low possibly as a result of antihypertensives/possible infection in setting of ascites. Will place Foley catheter for urinary retention, repeat UA/urine culture, resume antibiotics-Levaquin adjusted for creatinine clearance per pharmacy, and monitor for evidence of bleeding in which case would stop heparin-high risk for bleeding given malignant ascites. However hemoglobin has remained stable up to now. Patient denies any complaints except abdominal pain when she ambulates. 09/29/2014: Patient has gastric mass most likely malignant. Biopsies sent. Spoke with Dr. Oletta Lamas. Will follow recommendations from GI/oncology/general surgery/cardiology. Patient's blood pressure borderline low but she is mentating okay. I discussed latest developments with patient and her daughter at the bedside. Assessment/Plan: Principal Problem: Ascites, malignant with question of SBP (spontaneous bacterial peritonitis) at admission/gastric mass - Follow gastric biopsies  Acute Systolic CHF- diastolic CHF?- mod AS. Borderline low BP - EF found to be 30-35% today- last ECHO last yr- EF was 50 % and had mild pulm HTN - No evidence of fluid overload at present -Monitor fluid balance.   Atrial  fibrillation - Rate controlled. Still on heparin drip as may need more surgical intervention. Appreciate pharmacy.  CKD (chronic kidney disease), stage 3- 4/urinary retention - Some improvement in creatinine. Likely postobstructive element -Placed indwelling Foley catheter -Gentle fluids  COPD - cont Symbicort, Xopenex, Singulair.  - Appears stable  Appt with  PCP: requested Code Status: full code Family Communication:  Disposition Plan: ?Skilled nursing facility DVT prophylaxis: Heparin Consultants:GI/cardiology Procedures: Paracentesis Antibiotics:  Vancomycin 09/24/2014> 09/25/14  Ceftazidime 09/24/2014> 09/27/2014  Levaquin 09/28/2014>  HPI/Subjective: Pain better.  Objective: Filed Vitals:   09/29/14 1340  BP: 83/55  Pulse: 106  Temp:   Resp: 18    Intake/Output Summary (Last 24 hours) at 09/29/14 1845 Last data filed at 09/29/14 1832  Gross per 24 hour  Intake 2603.69 ml  Output    240 ml  Net 2363.69 ml   Filed Weights   09/27/14 0830 09/28/14 0200 09/29/14 0500  Weight: 69.899 kg (154 lb 1.6 oz) 71.895 kg (158 lb 8 oz) 73.8 kg (162 lb 11.2 oz)    Exam:   General:  Comfortable at rest.  Cardiovascular: S1-S2 normal. No murmurs. Pulse regular.  Respiratory: Good air entry bilaterally. No rhonchi or rales.  Abdomen: Remains distended.  Musculoskeletal: No pedal edema   Neurological: Intact  Data Reviewed: Basic Metabolic Panel:  Recent Labs Lab 09/23/14 0441 09/25/14 0350 09/27/14 0502 09/28/14 0518 09/29/14 0204  NA 138 141 133* 135 133*  K 3.5 3.9 4.4 4.5 4.7  CL 101 102 98* 101 102  CO2 28 30 28 26 25   GLUCOSE 99 87 102* 87 85  BUN 30* 32* 42* 43* 45*  CREATININE 1.93* 1.91* 2.46* 2.11* 1.95*  CALCIUM 8.1* 8.6* 8.7* 8.9 8.7*  MG 2.0  --   --   --   --   PHOS 3.6  --   --   --   --    Liver Function Tests:  Recent Labs Lab 09/23/14 0441 09/29/14 0204  AST 20 21  ALT 14 15  ALKPHOS 70 51  BILITOT 0.4 0.3  PROT  5.4* 5.1*  ALBUMIN 2.3* 1.8*   No results for input(s): LIPASE, AMYLASE in the last 168 hours. No results for input(s): AMMONIA in the last 168 hours. CBC:  Recent Labs Lab 09/25/14 0350 09/26/14 0335 09/27/14 0502 09/28/14 0518 09/29/14 0204  WBC 8.3 8.6 8.3 9.5 9.3  HGB 10.9* 10.7* 10.4* 10.0* 10.5*  HCT 35.2* 34.1* 33.0* 32.5* 32.8*  MCV 88.9 90.2 88.5 88.6 87.7  PLT 250 249 244 265 256   Cardiac Enzymes: No results for input(s): CKTOTAL, CKMB, CKMBINDEX, TROPONINI in the last 168 hours. BNP (last 3 results)  Recent Labs  02/26/14 1550 09/12/14 1448 09/22/14 1701  BNP 292.3* 360.2* 165.9*    ProBNP (last 3 results) No results for input(s): PROBNP in the last 8760 hours.  CBG:  Recent Labs Lab 09/25/14 0808 09/26/14 0754 09/27/14 0740 09/28/14 0729 09/29/14 0735  GLUCAP 125* 90 90 88 86    Recent Results (from the past 240 hour(s))  AFB culture with smear     Status: None (Preliminary result)   Collection Time: 09/23/14 10:39 AM  Result Value Ref Range Status   Specimen Description ABDOMEN  Final   Special Requests NONE  Final   Acid Fast Smear   Final    NO ACID FAST BACILLI SEEN Performed at [facility]    Culture   Final    CULTURE WILL BE EXAMINED FOR 6 WEEKS BEFORE ISSUING A FINAL REPORT Performed at [facility]    Report Status PENDING  Incomplete  Culture, body fluid-bottle     Status: None   Collection Time: 09/23/14 10:39 AM  Result Value Ref Range Status   Specimen Description FLUID ABDOMEN PERITONEAL  Final  Special Requests NONE  Final   Culture NO GROWTH 5 DAYS  Final   Report Status 09/28/2014 FINAL  Final  Gram stain     Status: None   Collection Time: 09/23/14 10:39 AM  Result Value Ref Range Status   Specimen Description FLUID ABDOMEN PERITONEAL  Final   Special Requests NONE  Final   Gram Stain   Final    ABUNDANT WBC PRESENT,BOTH PMN AND MONONUCLEAR NO ORGANISMS SEEN    Report Status 09/23/2014 FINAL   Final  Clostridium Difficile by PCR (not at [facility])     Status: None   Collection Time: 09/26/14 10:26 PM  Result Value Ref Range Status   C difficile by pcr NEGATIVE NEGATIVE Final     Studies: No results found.  Scheduled Meds: . allopurinol  100 mg Oral q morning - 10a  . bisoprolol  2.5 mg Oral Daily  . budesonide-formoterol  2 puff Inhalation BID  . cholecalciferol  1,000 Units Oral QHS  . cholecalciferol  2,000 Units Oral Daily  . clotrimazole  10 mg Oral 5 X Daily  . docusate sodium  100 mg Oral BID  . feeding supplement (ENSURE ENLIVE)  237 mL Oral BID BM  . folic acid  1 mg Oral Daily  . [START ON 09/30/2014] levofloxacin  250 mg Oral Q48H  . montelukast  10 mg Oral QHS  . multivitamin with minerals  1 tablet Oral Daily  . pantoprazole  80 mg Oral QPM  . saccharomyces boulardii  250 mg Oral BID  . sodium chloride  3 mL Intravenous Q12H  . thiamine  100 mg Oral Daily   Continuous Infusions: . heparin 1,450 Units/hr (09/29/14 1459)     Time spent: 20 minutes    Margot Oriordan  [facility] Pager [phone]. If 7PM-7AM, please contact night-coverage at [email or web address], password [facility] 09/29/2014, 6:45 PM  LOS: 7 days

## 2014-09-29 NOTE — Progress Notes (Signed)
Patient ID: [name] [name], female   DOB: 10/10/1923, 81 y.o.   MRN: [id number]    Subjective: Pt "not feeling too bad today."    Objective: Vital signs in last 24 hours: Temp:  [97.4 F (36.3 C)-98.5 F (36.9 C)] 97.4 F (36.3 C) (07/25 0501) Pulse Rate:  [81-95] 95 (07/25 0501) Resp:  [16-20] 16 (07/25 0501) BP: (79-97)/(44-61) 97/51 mmHg (07/25 0501) SpO2:  [97 %-100 %] 97 % (07/25 0921) Weight:  [73.8 kg (162 lb 11.2 oz)] 73.8 kg (162 lb 11.2 oz) (07/25 0500) Last BM Date: 09/28/14  Intake/Output from previous day: 07/24 0701 - 07/25 0700 In: 3203.7 [P.O.:1060; I.V.:307.7] Out: 320 [Urine:320] Intake/Output this shift:    PE: General- In NAD Abdomen-firm, distended, no significant tenderness  Lab Results:   Recent Labs  09/28/14 0518 09/29/14 0204  WBC 9.5 9.3  HGB 10.0* 10.5*  HCT 32.5* 32.8*  PLT 265 256   BMET  Recent Labs  09/28/14 0518 09/29/14 0204  NA 135 133*  K 4.5 4.7  CL 101 102  CO2 26 25  GLUCOSE 87 85  BUN 43* 45*  CREATININE 2.11* 1.95*  CALCIUM 8.9 8.7*   PT/INR No results for input(s): LABPROT, INR in the last 72 hours. Comprehensive Metabolic Panel:    Component Value Date/Time   NA 133* 09/29/2014 0204   NA 135 09/28/2014 0518   K 4.7 09/29/2014 0204   K 4.5 09/28/2014 0518   CL 102 09/29/2014 0204   CL 101 09/28/2014 0518   CO2 25 09/29/2014 0204   CO2 26 09/28/2014 0518   BUN 45* 09/29/2014 0204   BUN 43* 09/28/2014 0518   CREATININE 1.95* 09/29/2014 0204   CREATININE 2.11* 09/28/2014 0518   GLUCOSE 85 09/29/2014 0204   GLUCOSE 87 09/28/2014 0518   CALCIUM 8.7* 09/29/2014 0204   CALCIUM 8.9 09/28/2014 0518   AST 21 09/29/2014 0204   AST 20 09/23/2014 0441   ALT 15 09/29/2014 0204   ALT 14 09/23/2014 0441   ALKPHOS 51 09/29/2014 0204   ALKPHOS 70 09/23/2014 0441   BILITOT 0.3 09/29/2014 0204   BILITOT 0.4 09/23/2014 0441   PROT 5.1* 09/29/2014 0204   PROT 5.4* 09/23/2014 0441   ALBUMIN 1.8* 09/29/2014 0204   ALBUMIN 2.3* 09/23/2014 0441     Studies/Results: No results found.  Anti-infectives: Anti-infectives    Start     Dose/Rate Route Frequency Ordered Stop   09/30/14 2000  levofloxacin (LEVAQUIN) tablet 250 mg     250 mg Oral Every 48 hours 09/28/14 1926     09/28/14 2000  levofloxacin (LEVAQUIN) tablet 500 mg     500 mg Oral  Once 09/28/14 1926 09/28/14 2100   09/24/14 1600  cefTAZidime (FORTAZ) 1 g in dextrose 5 % 50 mL IVPB  Status:  Discontinued     1 g 100 mL/hr over 30 Minutes Intravenous Every 24 hours 09/24/14 1449 09/27/14 0747   09/24/14 1500  vancomycin (VANCOCIN) IVPB 1000 mg/200 mL premix  Status:  Discontinued     1,000 mg 200 mL/hr over 60 Minutes Intravenous Every 48 hours 09/24/14 1447 09/26/14 1054   09/22/14 2200  cefTRIAXone (ROCEPHIN) 2 g in dextrose 5 % 50 mL IVPB - Premix  Status:  Discontinued     2 g 100 mL/hr over 30 Minutes Intravenous Every 24 hours 09/22/14 2036 09/24/14 1431      Assessment Principal Problem:   Recurrent ascites with cytologies suggesting of malignant process. CA-125 is elevated suggesting  ovarian primary but this has also been reported to be elevated in some cases of  gastric cancer; distal stomach is abnormally thick on CT-she is interested in pursuing biopsy.  also CEA elevated.  Active Problems:   Hyperlipidemia   Atrial fibrillation   Chronic combined systolic and diastolic heart failure   Hypertension   CKD (chronic kidney disease), stage III   CHF exacerbation   Ascites   Acute on chronic combined systolic and diastolic congestive heart failure    LOS: 7 days   Plan:  EGD pending today.   Will see what results are and go from there.  Patient is very poor operative candidate given comorbidities and poor nutritional status.   If EGD unrevealing, recommend gyn onc consultation.   Recommend aggressive nutrition as this will also help ascites.     [facility] 09/29/2014

## 2014-09-29 NOTE — Progress Notes (Signed)
Physical Therapy Treatment Patient Details Name: [name] [name] MRN: [id number] DOB: 27-Jun-1923 Today's Date: 2014-10-06    History of Present Illness 81 yo female admitted with 09/22/14 with abdominal pain, ascites, Afib, Pna. Hx of CVA, Afib, CAD. Pt lives alone in senior apartments,    PT Comments    Pt had EGD with biopsy earlier however agreeable to mobilize.  Pt reports feeling better today and tolerated good distance ambulation with RW.  Follow Up Recommendations  SNF;Supervision/Assistance - 24 hour     Equipment Recommendations  None recommended by PT    Recommendations for Other Services       Precautions / Restrictions Precautions Precautions: Fall    Mobility  Bed Mobility Overal bed mobility: Needs Assistance Bed Mobility: Supine to Sit     Supine to sit: Min guard     General bed mobility comments: increased time and effort however no physical assist required  Transfers Overall transfer level: Needs assistance Equipment used: Rolling walker (2 wheeled) Transfers: Sit to/from Stand Sit to Stand: Min guard         General transfer comment: verbal cues for safe technique  Ambulation/Gait Ambulation/Gait assistance: Min guard Ambulation Distance (Feet): 120 Feet Assistive device: Rolling walker (2 wheeled) Gait Pattern/deviations: Step-through pattern;Decreased stride length     General Gait Details: slow pace, steady with RW, denies any dizziness, reports LE weakness and fatigue near end of ambulation   Stairs            Wheelchair Mobility    Modified Rankin (Stroke Patients Only)       Balance                                    Cognition Arousal/Alertness: Awake/alert Behavior During Therapy: WFL for tasks assessed/performed Overall Cognitive Status: Within Functional Limits for tasks assessed                      Exercises      General Comments        Pertinent Vitals/Pain Pain Assessment:  No/denies pain ("only in my stomach if I cough")    Home Living                      Prior Function            PT Goals (current goals can now be found in the care plan section) Progress towards PT goals: Progressing toward goals    Frequency  Min 3X/week    PT Plan Current plan remains appropriate    Co-evaluation             End of Session Equipment Utilized During Treatment: Gait belt Activity Tolerance: Patient limited by fatigue Patient left: in chair;with call bell/phone within reach;with family/visitor present     Time: 0814-4818 PT Time Calculation (min) (ACUTE ONLY): 14 min  Charges:  $Gait Training: 8-22 mins                    G Codes:      [place],KATHrine E 2014/10/06, 3:02 PM Carmelia Bake, PT, DPT 10/06/2014 Pager: [phone]

## 2014-09-29 NOTE — Progress Notes (Signed)
Patient has had low output via foley catheter all night.  MD was notified around 2300 pm of only 70 cc's output (since 7 pm) and an order for a 500 cc bolus was received and administered.  MD was notified again around 4 am of less than 100 cc output (since 11 pm); no orders received.  Urine is darker yellow, clear. Around a 4 lb weight gain again overnight.  Will continue to monitor patient., [place], Kane Kusek [place]

## 2014-09-29 NOTE — Transfer of Care (Signed)
Immediate Anesthesia Transfer of Care Note  Patient: [name] [name]  Procedure(s) Performed: Procedure(s): ESOPHAGOGASTRODUODENOSCOPY (EGD) (Left)  Patient Location: PACU  Anesthesia Type:MAC  Level of Consciousness: awake, alert  and oriented  Airway & Oxygen Therapy: Patient Spontanous Breathing and Patient connected to nasal cannula oxygen  Post-op Assessment: Report given to RN and Post -op Vital signs reviewed and stable  Post vital signs: Reviewed and stable  Last Vitals:  Filed Vitals:   09/29/14 1319  BP: 92/54  Pulse:   Temp: 36.8 C  Resp: 22    Complications: No apparent anesthesia complications

## 2014-09-29 NOTE — Interval H&P Note (Signed)
History and Physical Interval Note:  09/29/2014 12:42 PM  [name] [name]  has presented today for surgery, with the diagnosis of gastric wall thickening, ascites suspected malignant  The various methods of treatment have been discussed with the patient and family. After consideration of risks, benefits and other options for treatment, the patient has consented to  Procedure(s): ESOPHAGOGASTRODUODENOSCOPY (EGD) (Left) as a surgical intervention .  The patient's history has been reviewed, patient examined, no change in status, stable for surgery.  I have reviewed the patient's chart and labs.  Questions were answered to the patient's satisfaction.     Rossanna Spitzley JR,Kierstynn Babich L

## 2014-09-29 NOTE — Progress Notes (Signed)
CSW continuing to follow.   Per MD, pt not yet medically ready for discharge.  CSW received from weekend handoff that pt and pt daughter first choice is [facility] and second choice is [facility] and [facility].   CSW spoke with [facility] today and confirmed that facility can offer a bed.  CSW followed up with pt and pt daughter at bedside and notified that [facility] is able to offer a bed. Pt daughter stated that she was notified by [facility] earlier today regarding bed offer. Pt and pt daughter pleased that [facility] can offer a bed as pt has been to facility in the past.   CSW to continue to follow to provide support and assist with pt disposition needs.   Alison Murray, MSW, [place] [facility] [phone]

## 2014-09-30 ENCOUNTER — Encounter (HOSPITAL_COMMUNITY): Payer: Self-pay | Admitting: Gastroenterology

## 2014-09-30 LAB — PREALBUMIN: Prealbumin: 6 mg/dL — ABNORMAL LOW (ref 18–38)

## 2014-09-30 LAB — COMPREHENSIVE METABOLIC PANEL
ALBUMIN: 1.7 g/dL — AB (ref 3.5–5.0)
ALT: 19 U/L (ref 14–54)
AST: 24 U/L (ref 15–41)
Alkaline Phosphatase: 50 U/L (ref 38–126)
Anion gap: 10 (ref 5–15)
BUN: 48 mg/dL — AB (ref 6–20)
CO2: 24 mmol/L (ref 22–32)
Calcium: 9.4 mg/dL (ref 8.9–10.3)
Chloride: 100 mmol/L — ABNORMAL LOW (ref 101–111)
Creatinine, Ser: 2.29 mg/dL — ABNORMAL HIGH (ref 0.44–1.00)
GFR calc Af Amer: 21 mL/min — ABNORMAL LOW (ref >60)
GFR, EST NON AFRICAN AMERICAN: 18 mL/min — AB (ref >60)
Glucose, Bld: 78 mg/dL (ref 65–99)
POTASSIUM: 5.2 mmol/L — AB (ref 3.5–5.1)
Sodium: 134 mmol/L — ABNORMAL LOW (ref 135–145)
TOTAL PROTEIN: 5.3 g/dL — AB (ref 6.5–8.1)
Total Bilirubin: 0.6 mg/dL (ref 0.3–1.2)

## 2014-09-30 LAB — CBC
HEMATOCRIT: 33.7 % — AB (ref 36.0–46.0)
Hemoglobin: 10.6 g/dL — ABNORMAL LOW (ref 12.0–15.0)
MCH: 28.2 pg (ref 26.0–34.0)
MCHC: 31.5 g/dL (ref 30.0–36.0)
MCV: 89.6 fL (ref 78.0–100.0)
Platelets: 285 10*3/uL (ref 150–400)
RBC: 3.76 MIL/uL — ABNORMAL LOW (ref 3.87–5.11)
RDW: 15.4 % (ref 11.5–15.5)
WBC: 9.7 10*3/uL (ref 4.0–10.5)

## 2014-09-30 LAB — URINE CULTURE: Culture: NO GROWTH

## 2014-09-30 LAB — GLUCOSE, CAPILLARY: GLUCOSE-CAPILLARY: 67 mg/dL (ref 65–99)

## 2014-09-30 LAB — HEPARIN LEVEL (UNFRACTIONATED): Heparin Unfractionated: 0.56 [IU]/mL (ref 0.30–0.70)

## 2014-09-30 NOTE — Care Management Important Message (Signed)
Important Message  Patient Details  Name: Kaylany Tesoriero MRN: [id number] Date of Birth: 1923/08/02   Medicare Important Message Given:  Yes-fourth notification given    Camillo Flaming 09/30/2014, 2:10 [place] Message  Patient Details  Name: Chicquita Mendel MRN: [id number] Date of Birth: 1923/11/10   Medicare Important Message Given:  Yes-fourth notification given    Camillo Flaming 09/30/2014, 2:10 PM

## 2014-09-30 NOTE — Progress Notes (Signed)
Administered PO antibiotic, pt was hesitant about taking medication. Pt stated she was not nauseous but if she takes medication, she will vomit. Pt swallowed medication less than an hour later pt called out and had one episode of emesis. IV zofran administered. Pt refused remaining evening medication. Will continue to monitor.

## 2014-09-30 NOTE — Progress Notes (Signed)
[facility] PROGRESS NOTE Subjective pathology from gastric biopsies revealed gastric adenocarcinoma. Final report still pending. Patient complaining of increasing ascites and abdominal distention.  Objective: Vital signs in last 24 hours: Temp:  [97.8 F (36.6 C)-98.6 F (37 C)] 97.8 F (36.6 C) (07/26 1352) Pulse Rate:  [88-103] 88 (07/26 1352) Resp:  [20-22] 20 (07/26 1352) BP: (76-87)/(44-49) 84/46 mmHg (07/26 1352) SpO2:  [95 %-100 %] 100 % (07/26 1352) Weight:  [73.936 kg (163 lb)] 73.936 kg (163 lb) (07/26 0656) Last BM Date: 09/29/14  Intake/Output from previous day: 07/25 0701 - 07/26 0700 In: 877.6 [P.O.:60; I.V.:567.6] Out: 220 [Urine:200; Emesis/NG output:20] Intake/Output this shift: Total I/O In: -  Out: 100 [Urine:100]  PE: General-- alert and oriented somewhat nausea [place].  Abdomen-- marked abdominal distention with some tenderness worse since yesterday  Lab Results:  Recent Labs  09/28/14 0518 09/29/14 0204 09/30/14 0438  WBC 9.5 9.3 9.7  HGB 10.0* 10.5* 10.6*  HCT 32.5* 32.8* 33.7*  PLT 265 256 285   BMET  Recent Labs  09/28/14 0518 09/29/14 0204 09/30/14 0438  NA 135 133* 134*  K 4.5 4.7 5.2*  CL 101 102 100*  CO2 26 25 24   CREATININE 2.11* 1.95* 2.29*   LFT  Recent Labs  09/29/14 0204 09/30/14 0438  PROT 5.1* 5.3*  AST 21 24  ALT 15 19  ALKPHOS 51 50  BILITOT 0.3 0.6   PT/INR No results for input(s): LABPROT, INR in the last 72 hours. PANCREAS No results for input(s): LIPASE in the last 72 hours.       Studies/Results: No results found.  Medications: I have reviewed the patient's current medications.  Assessment/Plan: 1. Gastric adenocarcinoma with malignant ascites. Long discussion with the patient and her daughter about this fact that this is likely incurable. She has been followed by Dr Alen Blew who presumably will see her soon. She is having increasing abdominal distention and would probably benefit  from therapeutic paracentesis. Long discussion with the family and the prognosis at this point is quite poor. I will go ahead and order therapeutic paracentesis tomorrow   Neziah Vogelgesang JR,Millie Forde L 09/30/2014, 5:54 PM  Pager: [phone] If no answer or after hours call [phone]

## 2014-09-30 NOTE — Progress Notes (Signed)
ANTICOAGULATION CONSULT NOTE - Follow Up Consult  Pharmacy Consult for Heparin Indication: atrial fibrillation (Eliquis on hold)  Allergies  Allergen Reactions  . Baby Powder [Methylbenzethonium] Shortness Of Breath  . Demerol [Meperidine] Hives  . Fexofenadine Other (See Comments)    dizziness  . Penicillins Hives    Tolerates Ceftriaxone.  Marland Kitchen Spiriva Handihaler [Tiotropium Bromide Monohydrate]     Caused chest pains    Patient Measurements: Height: 5\' 1"  (154.9 cm) Weight: 163 lb (73.936 kg) IBW/kg (Calculated) : 47.8 Heparin Dosing Weight: 63 kg  Vital Signs: Temp: 98.6 F (37 C) (07/26 0656) Temp Source: Oral (07/26 0656) BP: 85/45 mmHg (07/26 0656) Pulse Rate: 89 (07/26 0656)  Labs:  Recent Labs  09/28/14 0518 09/28/14 1600 09/29/14 0204 09/29/14 2307 09/30/14 0438  HGB 10.0*  --  10.5*  --  10.6*  HCT 32.5*  --  32.8*  --  33.7*  PLT 265  --  256  --  285  HEPARINUNFRC 0.32 0.31 0.23* 0.43  --   CREATININE 2.11*  --  1.95*  --  2.29*    Estimated Creatinine Clearance: 15.6 mL/min (by C-G formula based on Cr of 2.29).   Medications:  Infusions:  . heparin 1,450 Units/hr (09/30/14 0000)    Assessment: 89 yoF admitted on 7/18 for SBP and emesis.  PMH includes apixaban PTA for hx PAF, CAD s/p bilateral CEA, chronic diastolic CHF (EF 88-11%), and ascites.  Anticoagulation was held for paracentesis on 7/19. Pharmacy consulted to start IV heparin after paracentesis completed in case patient required further procedures.   Last dose apixaban 2.5mg  on 7/18 @ 0930.  Poor renal function with CrCl < 30 ml/min.  Significant events: 7/25 Heparin held in AM 4 hours prior to EGD with biopsies, resumed 2 hours following procedure  Today, 09/30/2014:  Heparin level = 0.56, remaining therapeutic with infusion at 1450 units/hr  CBC: Hgb 10.6 (stable), platelets WNL  Renal: SCr 2.29, CrCl ~ 19 ml/min  Goal of Therapy:  Heparin level 0.3-0.7 units/ml Monitor  platelets by anticoagulation protocol: Yes   Plan:   Continue heparin infusion at 1450 units/hr.  Daily heparin level and CBC  Follow up long-term anticoag plan after invasive procedures completed.  Notes indicated that patient is not a surgical candidate and CCS has signed off.  Note, Patients with SCr > 2.5 or CrCl < 25 ml/min were excluded from clinical trials.  In patients with severe or end-stage chronic kidney disease, warfarin remains the anticoagulant of choice.  Hershal Coria, PharmD, BCPS Pager: [phone] 09/30/2014 8:42 AM

## 2014-09-30 NOTE — Progress Notes (Signed)
ANTICOAGULATION CONSULT NOTE - Follow Up Consult  Pharmacy Consult for Heparin Indication: atrial fibrillation  Allergies  Allergen Reactions  . Baby Powder [Methylbenzethonium] Shortness Of Breath  . Demerol [Meperidine] Hives  . Fexofenadine Other (See Comments)    dizziness  . Penicillins Hives    Tolerates Ceftriaxone.  Marland Kitchen Spiriva Handihaler [Tiotropium Bromide Monohydrate]     Caused chest pains    Patient Measurements: Height: 5\' 1"  (154.9 cm) Weight: 162 lb 11.2 oz (73.8 kg) IBW/kg (Calculated) : 47.8 Heparin Dosing Weight:   Vital Signs: Temp: 98.3 F (36.8 C) (07/25 2022) Temp Source: Oral (07/25 2022) BP: 87/49 mmHg (07/25 2201) Pulse Rate: 91 (07/25 2201)  Labs:  Recent Labs  09/28/14 0518 09/28/14 1600 09/29/14 0204 09/29/14 2307 09/30/14 0438  HGB 10.0*  --  10.5*  --  10.6*  HCT 32.5*  --  32.8*  --  33.7*  PLT 265  --  256  --  285  HEPARINUNFRC 0.32 0.31 0.23* 0.43  --   CREATININE 2.11*  --  1.95*  --  2.29*    Estimated Creatinine Clearance: 15.6 mL/min (by C-G formula based on Cr of 2.29).   Medications:  Infusions:  . heparin 1,450 Units/hr (09/30/14 0000)    Assessment: Patient with heparin level at goal.  RN noted that right after level drawn patient lost IV access.  RN called to inform of heparin restart ~0100.  Goal of Therapy:  Heparin level 0.3-0.7 units/ml Monitor platelets by anticoagulation protocol: Yes   Plan:  Continue heparin drip at current rate Recheck level at 0900 to make sure back at steady state after restart.  Tyler Deis, Shea Stakes [place] 09/30/2014,5:18 AM

## 2014-09-30 NOTE — Progress Notes (Signed)
Patient's BP continues to be low 87/49.  MD notified last night and order for a 250 cc bolus received.  In addition, patient reports extreme abdominal discomfort and increasing size of abdominal ascites.  Patient also reports regurgitation with even small sips of liquid.  Patient's output has been extremely low over the past few days.  So far, she has voided only 50cc's since foley removed at 530 pm.  Will continue to monitor patient this am.  [place], Miamarie Moll [place]

## 2014-09-30 NOTE — Care Management Note (Signed)
Case Management Note  Patient Details  Name: Avrie Kedzierski MRN: [id number] Date of Birth: 10-27-1923   Expected Discharge Date:   (unknown)               Expected Discharge Plan:  Skilled Nursing Facility  In-House Referral:  Clinical Social Work  Discharge planning Services  CM Consult  Post Acute Care Choice:    Choice offered to:     DME Arranged:    DME Agency:     HH Arranged:    [place] Agency:     Status of Service:  In process, will continue to follow  Medicare Important Message Given:  Yes-fourth notification given Date Medicare IM Given:    Medicare IM give by:    Date Additional Medicare IM Given:    Additional Medicare Important Message give by:     If discussed at [place] of Stay Meetings, dates discussed:    Additional Comments:  Lynnell Catalan, RN 09/30/2014, 3:18 PM

## 2014-09-30 NOTE — Anesthesia Postprocedure Evaluation (Signed)
  Anesthesia Post-op Note  Patient: [name] [name]  Procedure(s) Performed: Procedure(s): ESOPHAGOGASTRODUODENOSCOPY (EGD) (Left)  Patient Location: Endoscopy Unit  Anesthesia Type:MAC  Level of Consciousness: awake  Airway and Oxygen Therapy: Patient Spontanous Breathing and Patient connected to nasal cannula oxygen  Post-op Pain: none  Post-op Assessment: Post-op Vital signs reviewed, Patient's Cardiovascular Status Stable, Respiratory Function Stable, Patent Airway, No signs of Nausea or vomiting and Pain level controlled              Post-op Vital Signs: Reviewed and stable  Last Vitals:  Filed Vitals:   09/30/14 0656  BP: 85/45  Pulse: 89  Temp: 37 C  Resp: 20    Complications: No apparent anesthesia complications

## 2014-09-30 NOTE — Progress Notes (Signed)
[facility] PROGRESS NOTE  [name] [name] WIO:[id number] DOB: 01-Feb-1924 DOA: 09/22/2014 PCP: [name]  Brief narrative: 09/24/2014: I have seen and examined [name] [name] at bedside and reviewed her chart including off service note by Dr. Wynelle Cleveland. [facility]. [name] [name] is a pleasant 81 y.o. female with CAD, HTN, HLD, COPD, carotid artery disease s/p bilateral CEA, atrial fibrillation and chronic diastolic CHF, ascites (unknown cause), who presented with increased abdominal distension, now status post paracentesis twice with concern for malignant ascites, final cytology results pending. She is on treatment for presumed SBP although fluid cultures have remained negative. Patient also had 2-D echocardiogram which showed a decrease in ejection fraction from 50% in 2015 to 30-35% this time around. She complains of pleuritic chest pain, and it is possible that she has healthcare associated pneumonia given she was hospitalized a couple of months ago. Will therefore change antibiotics to vancomycin/cefepime to cover for nosocomial organisms, consult cardiology regarding low ejection fraction, ?further workup and follow GI recommendations. Otherwise she complains of cough and abdominal distention but leg edema has resolved. 09/25/2014: Appreciate GI/cardiology. CA-125 elevated. More concern for ovarian CA. Shared these concerns with patient and her daughter. Await final pathology results. Meanwhile, will continue current antibiotic and follow GI/cardiology recommendations. 09/26/2014: Septic workup remains negative. Discussed with Dr. Gerilyn Nestle. [place]. Appreciate help. Patient likely has malignant ascites from ovarian CA. Will consult oncology for help with further diagnostic pathway, discontinue vancomycin and follow GI/cardiology recommendations. Patient on raloxifene, not sure of indication. Will discontinue for now given cardiac issues. 09/27/2014: Appreciate cardiology/general  surgery. Spoke with Dr. Zella Richer. I agree with thought process regarding whether patient would eventually be a candidate for chemotherapy. At this point her comorbidities militate against aggressive treatment of any type of cancer, is patient with renal insufficiency/CHF at 81 years of age. We'll follow-up with patient and family regarding decision on laparoscopic biopsy. Meanwhile, patient is worsening renal insufficiency which is concerning. Her BP is borderline low which may be worsening the renal insufficiency as well. We'll gently rehydrate and discontinue antibiotics 09/28/2014: Appreciate general surgery/GI. Noted plans for EGD in a.m. Patient's blood pressure borderline low-no evidence of bleeding, urinary retention present, BP low possibly as a result of antihypertensives/possible infection in setting of ascites. Will place Foley catheter for urinary retention, repeat UA/urine culture, resume antibiotics-Levaquin adjusted for creatinine clearance per pharmacy, and monitor for evidence of bleeding in which case would stop heparin-high risk for bleeding given malignant ascites. However hemoglobin has remained stable up to now. Patient denies any complaints except abdominal pain when she ambulates. 09/29/2014: Patient has gastric mass most likely malignant. Biopsies sent. Spoke with Dr. Oletta Lamas. Will follow recommendations from GI/oncology/general surgery/cardiology. Patient's blood pressure borderline low but she is mentating okay. I discussed latest developments with patient and her daughter at the bedside. 09/30/2014: Pathology shows gastric adenocarcinoma. Discussed results with patient and daughter at bedside. Also discussed with Dr. Cindee Salt. Barry Dienes. Await oncology input. Patient's blood pressure remains borderline low but she is mentating okay. Therefore will continue current management. Patient nauseated and occasionally vomiting. Will continue current management including IV heparin/Levaquin  and follow oncology/general surgery/GI recommendations-for therapeutic paracentesis tomorrow, ? Long-term management of recurrent ascites-depends on clinical pathway chosen. Patient does not seem an ideal candidate for aggressive measures. Assessment/Plan: Principal Problem: Ascites, malignant with question of SBP (spontaneous bacterial peritonitis) at admission/gastric adenocarcinoma - Follow oncology  Acute Systolic CHF- diastolic CHF?- mod AS. Borderline low BP - EF found to be 30-35% today- last ECHO last  yr- EF was 50 % and had mild pulm HTN - No evidence of fluid overload at present -Monitor fluid balance.   Atrial fibrillation - Rate controlled. Still on heparin drip as may need more surgical intervention. Appreciate pharmacy.  CKD (chronic kidney disease), stage 3- 4/urinary retention - Some improvement in creatinine. Likely postobstructive element -Gentle fluids  COPD - cont Symbicort, Xopenex, Singulair.  - Appears stable  Appt with PCP: requested Code Status: full code Family Communication:  Disposition Plan: ?Skilled nursing facility-TBD DVT prophylaxis: Heparin Consultants:GI/cardiology Procedures: Paracentesis Antibiotics:  Vancomycin 09/24/2014> 09/25/14  Ceftazidime 09/24/2014> 09/27/2014  Levaquin 09/28/2014>  HPI/Subjective: Pain better. Some nausea.  Objective: Filed Vitals:   09/30/14 1352  BP: 84/46  Pulse: 88  Temp: 97.8 F (36.6 C)  Resp: 20    Intake/Output Summary (Last 24 hours) at 09/30/14 2037 Last data filed at 09/30/14 1100  Gross per 24 hour  Intake 877.58 ml  Output    200 ml  Net 677.58 ml   Filed Weights   09/28/14 0200 09/29/14 0500 09/30/14 0656  Weight: 71.895 kg (158 lb 8 oz) 73.8 kg (162 lb 11.2 oz) 73.936 kg (163 lb)    Exam:   General:  Comfortable at rest.  Cardiovascular: S1-S2 normal. No murmurs. Pulse regular.  Respiratory: Good air entry bilaterally. No rhonchi or rales.  Abdomen: Uniformly  distended  Musculoskeletal: No pedal edema   Neurological: Intact  Data Reviewed: Basic Metabolic Panel:  Recent Labs Lab 09/25/14 0350 09/27/14 0502 09/28/14 0518 09/29/14 0204 09/30/14 0438  NA 141 133* 135 133* 134*  K 3.9 4.4 4.5 4.7 5.2*  CL 102 98* 101 102 100*  CO2 30 28 26 25 24   GLUCOSE 87 102* 87 85 78  BUN 32* 42* 43* 45* 48*  CREATININE 1.91* 2.46* 2.11* 1.95* 2.29*  CALCIUM 8.6* 8.7* 8.9 8.7* 9.4   Liver Function Tests:  Recent Labs Lab 09/29/14 0204 09/30/14 0438  AST 21 24  ALT 15 19  ALKPHOS 51 50  BILITOT 0.3 0.6  PROT 5.1* 5.3*  ALBUMIN 1.8* 1.7*   No results for input(s): LIPASE, AMYLASE in the last 168 hours. No results for input(s): AMMONIA in the last 168 hours. CBC:  Recent Labs Lab 09/26/14 0335 09/27/14 0502 09/28/14 0518 09/29/14 0204 09/30/14 0438  WBC 8.6 8.3 9.5 9.3 9.7  HGB 10.7* 10.4* 10.0* 10.5* 10.6*  HCT 34.1* 33.0* 32.5* 32.8* 33.7*  MCV 90.2 88.5 88.6 87.7 89.6  PLT 249 244 265 256 285   Cardiac Enzymes: No results for input(s): CKTOTAL, CKMB, CKMBINDEX, TROPONINI in the last 168 hours. BNP (last 3 results)  Recent Labs  02/26/14 1550 09/12/14 1448 09/22/14 1701  BNP 292.3* 360.2* 165.9*    ProBNP (last 3 results) No results for input(s): PROBNP in the last 8760 hours.  CBG:  Recent Labs Lab 09/26/14 0754 09/27/14 0740 09/28/14 0729 09/29/14 0735 09/30/14 0730  GLUCAP 90 90 88 86 67    Recent Results (from the past 240 hour(s))  AFB culture with smear     Status: None (Preliminary result)   Collection Time: 09/23/14 10:39 AM  Result Value Ref Range Status   Specimen Description ABDOMEN  Final   Special Requests NONE  Final   Acid Fast Smear   Final    NO ACID FAST BACILLI SEEN Performed at [facility]    Culture   Final    CULTURE WILL BE EXAMINED FOR 6 WEEKS BEFORE ISSUING A FINAL REPORT Performed  at [facility]    Report Status PENDING  Incomplete  Culture, body  fluid-bottle     Status: None   Collection Time: 09/23/14 10:39 AM  Result Value Ref Range Status   Specimen Description FLUID ABDOMEN PERITONEAL  Final   Special Requests NONE  Final   Culture NO GROWTH 5 DAYS  Final   Report Status 09/28/2014 FINAL  Final  Gram stain     Status: None   Collection Time: 09/23/14 10:39 AM  Result Value Ref Range Status   Specimen Description FLUID ABDOMEN PERITONEAL  Final   Special Requests NONE  Final   Gram Stain   Final    ABUNDANT WBC PRESENT,BOTH PMN AND MONONUCLEAR NO ORGANISMS SEEN    Report Status 09/23/2014 FINAL  Final  Clostridium Difficile by PCR (not at [facility])     Status: None   Collection Time: 09/26/14 10:26 PM  Result Value Ref Range Status   C difficile by pcr NEGATIVE NEGATIVE Final  Culture, Urine     Status: None   Collection Time: 09/28/14 11:36 PM  Result Value Ref Range Status   Specimen Description URINE, CLEAN CATCH  Final   Special Requests NONE  Final   Culture   Final    NO GROWTH 1 DAY Performed at [facility]    Report Status 09/30/2014 FINAL  Final     Studies: No results found.  Scheduled Meds: . allopurinol  100 mg Oral q morning - 10a  . bisoprolol  2.5 mg Oral Daily  . budesonide-formoterol  2 puff Inhalation BID  . cholecalciferol  1,000 Units Oral QHS  . cholecalciferol  2,000 Units Oral Daily  . clotrimazole  10 mg Oral 5 X Daily  . docusate sodium  100 mg Oral BID  . feeding supplement (ENSURE ENLIVE)  237 mL Oral BID BM  . folic acid  1 mg Oral Daily  . levofloxacin  250 mg Oral Q48H  . montelukast  10 mg Oral QHS  . multivitamin with minerals  1 tablet Oral Daily  . pantoprazole  80 mg Oral QPM  . saccharomyces boulardii  250 mg Oral BID  . sodium chloride  3 mL Intravenous Q12H  . thiamine  100 mg Oral Daily   Continuous Infusions: . heparin 1,450 Units/hr (09/30/14 1412)     Time spent: 25 minutes    Deshon Koslowski  [facility] Pager [phone]. If 7PM-7AM,  please contact night-coverage at [email or web address], password [facility] 09/30/2014, 8:37 PM  LOS: 8 days

## 2014-09-30 NOTE — Progress Notes (Signed)
Patient ID: [name] [name], female   DOB: 12/25/1923, 81 y.o.   MRN: [id number] 1 Day Post-Op  Subjective: Pt doing ok today, awaiting paracentesis it sounds like.  Objective: Vital signs in last 24 hours: Temp:  [98 F (36.7 C)-98.6 F (37 C)] 98.6 F (37 C) (07/26 0656) Pulse Rate:  [87-110] 89 (07/26 0656) Resp:  [12-22] 20 (07/26 0656) BP: (64-92)/(44-58) 85/45 mmHg (07/26 0656) SpO2:  [95 %-99 %] 97 % (07/26 0827) Weight:  [73.936 kg (163 lb)] 73.936 kg (163 lb) (07/26 0656) Last BM Date: 09/29/14  Intake/Output from previous day: 07/25 0701 - 07/26 0700 In: 877.6 [P.O.:60; I.V.:567.6] Out: 220 [Urine:200; Emesis/NG output:20] Intake/Output this shift:    PE: Abd: soft, distended, tender, +BS  Lab Results:   Recent Labs  09/29/14 0204 09/30/14 0438  WBC 9.3 9.7  HGB 10.5* 10.6*  HCT 32.8* 33.7*  PLT 256 285   BMET  Recent Labs  09/29/14 0204 09/30/14 0438  NA 133* 134*  K 4.7 5.2*  CL 102 100*  CO2 25 24  GLUCOSE 85 78  BUN 45* 48*  CREATININE 1.95* 2.29*  CALCIUM 8.7* 9.4   PT/INR No results for input(s): LABPROT, INR in the last 72 hours. CMP     Component Value Date/Time   NA 134* 09/30/2014 0438   K 5.2* 09/30/2014 0438   CL 100* 09/30/2014 0438   CO2 24 09/30/2014 0438   GLUCOSE 78 09/30/2014 0438   BUN 48* 09/30/2014 0438   CREATININE 2.29* 09/30/2014 0438   CALCIUM 9.4 09/30/2014 0438   PROT 5.3* 09/30/2014 0438   ALBUMIN 1.7* 09/30/2014 0438   AST 24 09/30/2014 0438   ALT 19 09/30/2014 0438   ALKPHOS 50 09/30/2014 0438   BILITOT 0.6 09/30/2014 0438   GFRNONAA 18* 09/30/2014 0438   GFRAA 21* 09/30/2014 0438   Lipase     Component Value Date/Time   LIPASE 29 09/22/2014 1654       Studies/Results: No results found.  Anti-infectives: Anti-infectives    Start     Dose/Rate Route Frequency Ordered Stop   09/30/14 2000  levofloxacin (LEVAQUIN) tablet 250 mg     250 mg Oral Every 48 hours 09/28/14 1926     09/28/14 2000   levofloxacin (LEVAQUIN) tablet 500 mg     500 mg Oral  Once 09/28/14 1926 09/28/14 2100   09/24/14 1600  cefTAZidime (FORTAZ) 1 g in dextrose 5 % 50 mL IVPB  Status:  Discontinued     1 g 100 mL/hr over 30 Minutes Intravenous Every 24 hours 09/24/14 1449 09/27/14 0747   09/24/14 1500  vancomycin (VANCOCIN) IVPB 1000 mg/200 mL premix  Status:  Discontinued     1,000 mg 200 mL/hr over 60 Minutes Intravenous Every 48 hours 09/24/14 1447 09/26/14 1054   09/22/14 2200  cefTRIAXone (ROCEPHIN) 2 g in dextrose 5 % 50 mL IVPB - Premix  Status:  Discontinued     2 g 100 mL/hr over 30 Minutes Intravenous Every 24 hours 09/22/14 2036 09/24/14 1431       Assessment/Plan  1. Probably gastric malignancy with malignant ascites - patient unfortunately is not a surgical candidate given gastric malignancy with malignant ascites.  We would recommend oncology consult for further treatment options.  I have discussed this with the patient.  She understands.  Nothing further to add surgically.  We will sign off.  LOS: 8 days    Iva Posten E 09/30/2014, 8:29 AM Pager: [phone]

## 2014-10-01 ENCOUNTER — Inpatient Hospital Stay (HOSPITAL_COMMUNITY): Payer: Medicare Other

## 2014-10-01 DIAGNOSIS — R339 Retention of urine, unspecified: Secondary | ICD-10-CM

## 2014-10-01 DIAGNOSIS — Z515 Encounter for palliative care: Secondary | ICD-10-CM

## 2014-10-01 DIAGNOSIS — C786 Secondary malignant neoplasm of retroperitoneum and peritoneum: Secondary | ICD-10-CM

## 2014-10-01 DIAGNOSIS — C169 Malignant neoplasm of stomach, unspecified: Principal | ICD-10-CM

## 2014-10-01 DIAGNOSIS — E875 Hyperkalemia: Secondary | ICD-10-CM

## 2014-10-01 DIAGNOSIS — N179 Acute kidney failure, unspecified: Secondary | ICD-10-CM | POA: Diagnosis present

## 2014-10-01 LAB — BASIC METABOLIC PANEL
Anion gap: 10 (ref 5–15)
BUN: 57 mg/dL — ABNORMAL HIGH (ref 6–20)
CO2: 24 mmol/L (ref 22–32)
Calcium: 9.7 mg/dL (ref 8.9–10.3)
Chloride: 99 mmol/L — ABNORMAL LOW (ref 101–111)
Creatinine, Ser: 2.7 mg/dL — ABNORMAL HIGH (ref 0.44–1.00)
GFR calc Af Amer: 17 mL/min — ABNORMAL LOW (ref >60)
GFR, EST NON AFRICAN AMERICAN: 15 mL/min — AB (ref >60)
GLUCOSE: 79 mg/dL (ref 65–99)
Potassium: 5.9 mmol/L — ABNORMAL HIGH (ref 3.5–5.1)
Sodium: 133 mmol/L — ABNORMAL LOW (ref 135–145)

## 2014-10-01 LAB — CBC
HCT: 33.6 % — ABNORMAL LOW (ref 36.0–46.0)
HEMOGLOBIN: 10.5 g/dL — AB (ref 12.0–15.0)
MCH: 28.2 pg (ref 26.0–34.0)
MCHC: 31.3 g/dL (ref 30.0–36.0)
MCV: 90.1 fL (ref 78.0–100.0)
PLATELETS: 310 10*3/uL (ref 150–400)
RBC: 3.73 MIL/uL — ABNORMAL LOW (ref 3.87–5.11)
RDW: 15.4 % (ref 11.5–15.5)
WBC: 10.4 10*3/uL (ref 4.0–10.5)

## 2014-10-01 LAB — GLUCOSE, CAPILLARY
GLUCOSE-CAPILLARY: 66 mg/dL (ref 65–99)
Glucose-Capillary: 68 mg/dL (ref 65–99)
Glucose-Capillary: 74 mg/dL (ref 65–99)

## 2014-10-01 LAB — HEPARIN LEVEL (UNFRACTIONATED): HEPARIN UNFRACTIONATED: 0.57 [IU]/mL (ref 0.30–0.70)

## 2014-10-01 MED ORDER — SODIUM POLYSTYRENE SULFONATE 15 GM/60ML PO SUSP
30.0000 g | Freq: Once | ORAL | Status: AC
  Administered 2014-10-01: 30 g via ORAL
  Filled 2014-10-01 (×2): qty 120

## 2014-10-01 NOTE — Progress Notes (Signed)
PT Cancellation Note  Patient Details Name: [name] [name] MRN: [id number] DOB: January 30, 1924   Cancelled Treatment:    Reason Eval/Treat Not Completed: Patient at procedure or test/unavailable   Claretha Cooper 10/01/2014, 12:44 PM Tresa Endo PT [phone]

## 2014-10-01 NOTE — Progress Notes (Signed)
Oncology addendum:  Her recent findings were discussed with her daughter [name] [name] and she is aware of the diagnosis and prognosis at this time. She understands that she has limited life expectancy probably less than 6 months. I recommended supportive care only moving forward. In terms of disposition, I do not anticipate that she will be fully ambulatory for an extended period of time. I think she will continue to decline moving forward and she will require more assistance.  All questions were answered today to her satisfaction.

## 2014-10-01 NOTE — Progress Notes (Signed)
IP PROGRESS NOTE  Subjective:   Events noted. Patient had a comfortable night. Still complains of abdominal fullness. She did have an episode of vomiting last night. She has not reported any fevers or chills.  Objective:  Vital signs in last 24 hours: Temp:  [97.3 F (36.3 C)-98.2 F (36.8 C)] 97.3 F (36.3 C) (07/27 0509) Pulse Rate:  [88-99] 94 (07/27 0509) Resp:  [18-20] 18 (07/27 0509) BP: (76-87)/(43-57) 76/43 mmHg (07/27 0509) SpO2:  [94 %-100 %] 100 % (07/27 0509) Weight:  [164 lb 1.6 oz (74.435 kg)] 164 lb 1.6 oz (74.435 kg) (07/27 0509) Weight change: 1 lb 1.6 oz (0.499 kg) Last BM Date: 09/30/14  Intake/Output from previous day: 07/26 0701 - 07/27 0700 In: 175 [P.O.:175] Out: 100 [Urine:100] Alert, awake woman appeared in no distress. Mouth: mucous membranes moist, pharynx normal without lesions Resp: clear to auscultation bilaterally Cardio: regular rate and rhythm, S1, S2 normal, no murmur, click, rub or gallop GI: Slightly distended with sifting dullness. Good bowel sounds. No rebound or guarding. Extremities: extremities normal, atraumatic, no cyanosis or edema    Lab Results:  Recent Labs  09/30/14 0438 10/01/14 0435  WBC 9.7 10.4  HGB 10.6* 10.5*  HCT 33.7* 33.6*  PLT 285 310    BMET  Recent Labs  09/30/14 0438 10/01/14 0435  NA 134* 133*  K 5.2* 5.9*  CL 100* 99*  CO2 24 24  GLUCOSE 78 79  BUN 48* 57*  CREATININE 2.29* 2.70*  CALCIUM 9.4 9.7    Studies/Results: No results found.  Medications: I have reviewed the patient's current medications.  Assessment/Plan:  81 year old woman with the following issues:  1. Malignant ascites etiology likely from a intra-abdominal malignancy of a GYN etiology versus GI source. She is status post endoscopy completed on 09/29/2014. Based on pathology results from 09/29/2014, this is most likely gastric adenocarcinoma with peritoneal metastasis.  These findings were discussed with the patient  today and the implications were reviewed. Given the fact that we are dealing with gastric cancer as opposed to ovarian cancer, the approach will be different. I do not think palliative chemotherapy would be very beneficial for her given her overall clinical picture. The complications associated with chemotherapy do not justify the marginal benefit in the setting of gastric cancer. This would been different if we're dealing with ovarian cancer.  I have recommended supportive care only and hospice enrollment upon discharge. Her prognosis is rather poor with very limited life expectancy probably less than 6 months.  2. Renal insufficiency: Her creatinine have improved slightly overnight with a reasonable urine output.  3. Ascites: She would benefit from a therapeutic paracentesis for palliative purposes.  I will discuss this with her daughter at some point today.   Please call with questions regarding this patient.     LOS: 9 days   Marcques Wrightsman 10/01/2014, 8:12 AM

## 2014-10-01 NOTE — Progress Notes (Signed)
ANTIBIOTIC CONSULT NOTE - FOLLOW UP  Pharmacy Consult for Levaquin Indication: UTI  Allergies  Allergen Reactions  . Baby Powder [Methylbenzethonium] Shortness Of Breath  . Demerol [Meperidine] Hives  . Fexofenadine Other (See Comments)    dizziness  . Penicillins Hives    Tolerates Ceftriaxone.  Marland Kitchen Spiriva Handihaler [Tiotropium Bromide Monohydrate]     Caused chest pains    Patient Measurements: Height: 5\' 1"  (154.9 cm) Weight: 164 lb 1.6 oz (74.435 kg) IBW/kg (Calculated) : 47.8 Adjusted Body Weight:   Vital Signs: Temp: 97.3 F (36.3 C) (07/27 0509) Temp Source: Oral (07/27 0509) BP: 76/43 mmHg (07/27 0509) Pulse Rate: 94 (07/27 0509) Intake/Output from previous day: 07/26 0701 - 07/27 0700 In: 175 [P.O.:175] Out: 100 [Urine:100] Intake/Output from this shift: Total I/O In: 120 [P.O.:120] Out: 175 [Urine:175]  Labs:  Recent Labs  09/29/14 0204 09/30/14 0438 10/01/14 0435  WBC 9.3 9.7 10.4  HGB 10.5* 10.6* 10.5*  PLT 256 285 310  CREATININE 1.95* 2.29* 2.70*   Estimated Creatinine Clearance: 13.3 mL/min (by C-G formula based on Cr of 2.7). No results for input(s): VANCOTROUGH, VANCOPEAK, VANCORANDOM, GENTTROUGH, GENTPEAK, GENTRANDOM, TOBRATROUGH, TOBRAPEAK, TOBRARND, AMIKACINPEAK, AMIKACINTROU, AMIKACIN in the last 72 hours.   Microbiology: Recent Results (from the past 720 hour(s))  Urine culture     Status: None   Collection Time: 09/12/14  2:56 PM  Result Value Ref Range Status   Specimen Description URINE, CLEAN CATCH  Final   Special Requests NONE  Final   Culture   Final    >=100,000 COLONIES/mL ESCHERICHIA COLI Performed at [facility]    Report Status 09/15/2014 FINAL  Final   Organism ID, Bacteria ESCHERICHIA COLI  Final      Susceptibility   Escherichia coli - MIC*    AMPICILLIN <=2 SENSITIVE Sensitive     CEFAZOLIN <=4 SENSITIVE Sensitive     CEFTRIAXONE <=1 SENSITIVE Sensitive     CIPROFLOXACIN >=4 RESISTANT Resistant    GENTAMICIN <=1 SENSITIVE Sensitive     IMIPENEM <=0.25 SENSITIVE Sensitive     NITROFURANTOIN <=16 SENSITIVE Sensitive     TRIMETH/SULFA <=20 SENSITIVE Sensitive     AMPICILLIN/SULBACTAM <=2 SENSITIVE Sensitive     PIP/TAZO <=4 SENSITIVE Sensitive     * >=100,000 COLONIES/mL ESCHERICHIA COLI  Culture, body fluid-bottle     Status: None   Collection Time: 09/18/14 12:33 PM  Result Value Ref Range Status   Specimen Description FLUID ABDOMEN PERITONEAL  Final   Special Requests BAA 5MLS  Final   Culture NO GROWTH 5 DAYS  Final   Report Status 09/23/2014 FINAL  Final  Gram stain     Status: None   Collection Time: 09/18/14 12:33 PM  Result Value Ref Range Status   Specimen Description FLUID ABDOMEN PERITONEAL  Final   Special Requests NONE  Final   Gram Stain   Final    MODERATE WBC PRESENT, PREDOMINANTLY PMN NO ORGANISMS SEEN    Report Status 09/19/2014 FINAL  Final  AFB culture with smear     Status: None (Preliminary result)   Collection Time: 09/23/14 10:39 AM  Result Value Ref Range Status   Specimen Description ABDOMEN  Final   Special Requests NONE  Final   Acid Fast Smear   Final    NO ACID FAST BACILLI SEEN Performed at [facility]    Culture   Final    CULTURE WILL BE EXAMINED FOR 6 WEEKS BEFORE ISSUING A FINAL REPORT Performed at  [facility]    Report Status PENDING  Incomplete  Culture, body fluid-bottle     Status: None   Collection Time: 09/23/14 10:39 AM  Result Value Ref Range Status   Specimen Description FLUID ABDOMEN PERITONEAL  Final   Special Requests NONE  Final   Culture NO GROWTH 5 DAYS  Final   Report Status 09/28/2014 FINAL  Final  Gram stain     Status: None   Collection Time: 09/23/14 10:39 AM  Result Value Ref Range Status   Specimen Description FLUID ABDOMEN PERITONEAL  Final   Special Requests NONE  Final   Gram Stain   Final    ABUNDANT WBC PRESENT,BOTH PMN AND MONONUCLEAR NO ORGANISMS SEEN    Report Status 09/23/2014  FINAL  Final  Clostridium Difficile by PCR (not at [facility])     Status: None   Collection Time: 09/26/14 10:26 PM  Result Value Ref Range Status   C difficile by pcr NEGATIVE NEGATIVE Final  Culture, Urine     Status: None   Collection Time: 09/28/14 11:36 PM  Result Value Ref Range Status   Specimen Description URINE, CLEAN CATCH  Final   Special Requests NONE  Final   Culture   Final    NO GROWTH 1 DAY Performed at [facility]    Report Status 09/30/2014 FINAL  Final    Anti-infectives    Start     Dose/Rate Route Frequency Ordered Stop   09/30/14 2000  levofloxacin (LEVAQUIN) tablet 250 mg     250 mg Oral Every 48 hours 09/28/14 1926     09/28/14 2000  levofloxacin (LEVAQUIN) tablet 500 mg     500 mg Oral  Once 09/28/14 1926 09/28/14 2100   09/24/14 1600  cefTAZidime (FORTAZ) 1 g in dextrose 5 % 50 mL IVPB  Status:  Discontinued     1 g 100 mL/hr over 30 Minutes Intravenous Every 24 hours 09/24/14 1449 09/27/14 0747   09/24/14 1500  vancomycin (VANCOCIN) IVPB 1000 mg/200 mL premix  Status:  Discontinued     1,000 mg 200 mL/hr over 60 Minutes Intravenous Every 48 hours 09/24/14 1447 09/26/14 1054   09/22/14 2200  cefTRIAXone (ROCEPHIN) 2 g in dextrose 5 % 50 mL IVPB - Premix  Status:  Discontinued     2 g 100 mL/hr over 30 Minutes Intravenous Every 24 hours 09/22/14 2036 09/24/14 1431      Assessment: 14 yoF with newly diagnosed gastric adenocarcinoma with malignant ascites and worsening renal function. Pt initially on antibiotics for possible SBP, now on levaquin for UTI.  Noted pt vomited 1 hour after receiving PO levaquin last night.    Antibiotics:  PTA Cipro 7/18 >> Ceftriaxone >> 7/20 7/20 >> vanc >> 7/22 7/20 >> ceftazidime >> 7/23 7/24 >> Levaquin >>  Cultures:  7/22 CDiff: Negative 7/24 Urine :NGF  Goal of Therapy:  Eradication of infection  Plan:  Continue levaquin 250mg  PO q48 for CrCl 10-19 ml/min F/u duration of therapy  Ralene Bathe,  PharmD, BCPS 10/01/2014, 8:05 AM  Pager: [phone]

## 2014-10-01 NOTE — Progress Notes (Signed)
ANTICOAGULATION CONSULT NOTE - Follow Up Consult  Pharmacy Consult for Heparin Indication: atrial fibrillation (Eliquis on hold)  Allergies  Allergen Reactions  . Baby Powder [Methylbenzethonium] Shortness Of Breath  . Demerol [Meperidine] Hives  . Fexofenadine Other (See Comments)    dizziness  . Penicillins Hives    Tolerates Ceftriaxone.  Marland Kitchen Spiriva Handihaler [Tiotropium Bromide Monohydrate]     Caused chest pains    Patient Measurements: Height: 5\' 1"  (154.9 cm) Weight: 164 lb 1.6 oz (74.435 kg) IBW/kg (Calculated) : 47.8 Heparin Dosing Weight: 63 kg  Vital Signs: Temp: 97.3 F (36.3 C) (07/27 0509) Temp Source: Oral (07/27 0509) BP: 76/43 mmHg (07/27 0509) Pulse Rate: 94 (07/27 0509)  Labs:  Recent Labs  09/29/14 0204 09/29/14 2307 09/30/14 0438 09/30/14 0838 10/01/14 0425 10/01/14 0435  HGB 10.5*  --  10.6*  --   --  10.5*  HCT 32.8*  --  33.7*  --   --  33.6*  PLT 256  --  285  --   --  310  HEPARINUNFRC 0.23* 0.43  --  0.56 0.57  --   CREATININE 1.95*  --  2.29*  --   --  2.70*    Estimated Creatinine Clearance: 13.3 mL/min (by C-G formula based on Cr of 2.7).   Medications:  Infusions:  . heparin 1,450 Units/hr (09/30/14 1412)    Assessment: 89 yoF admitted on 7/18 for SBP and emesis.  PMH includes apixaban PTA for hx PAF, CAD s/p bilateral CEA, chronic diastolic CHF (EF 41-74%), and ascites.  Anticoagulation was held for paracentesis on 7/19. Pharmacy consulted to start IV heparin after paracentesis completed in case patient required further procedures.   Last dose apixaban 2.5mg  on 7/18 @ 0930.  Poor renal function with CrCl < 30 ml/min.  Significant events: 7/25 Heparin held in AM 4 hours prior to EGD with biopsies, resumed 2 hours following procedure 7/27 Pathology reveals gastric adenocarcinoma with malignant ascites.  Therapeutic paracentesis today  Today, 10/01/2014:  Heparin level = 0.57, remaining therapeutic with infusion at 1450  units/hr  CBC: Hgb 10.5 (stable), platelets WNL  Renal: SCr bumped up, 2.7, CrCl ~ 13 ml/min  No issues per RN  Goal of Therapy:  Heparin level 0.3-0.7 units/ml Monitor platelets by anticoagulation protocol: Yes   Plan:   Continue heparin infusion at 1450 units/hr.  F/u timing of paracentesis and when to stop heparin  Daily heparin level and CBC  Follow up long-term anticoag plan after invasive procedures completed.  Notes indicated that patient is not a surgical candidate and CCS has signed off.  Note, Patients with SCr > 2.5 or CrCl < 25 ml/min were excluded from clinical trials.  In patients with severe or end-stage chronic kidney disease, warfarin remains the anticoagulant of choice.  Ralene Bathe, PharmD, BCPS 10/01/2014, 7:55 AM  Pager: [phone]

## 2014-10-01 NOTE — Consult Note (Signed)
Consultation Note Date: 10/01/2014   Patient Name: [name] [name]  DOB: 1923-05-14  MRN: [id number]  Age / Sex: 81 y.o., female   PCP: Carlos Levering, PA-C Referring Physician: Venetia Maxon Rama, MD  Reason for Consultation: Establishing goals of care  Palliative Care Assessment and Plan Summary of Established Goals of Care and Medical Treatment Preferences    Palliative Care Discussion Held Today:   I met today with [name] [name] and her daughter [name] at bedside. They tell me that they have good understanding of [name] [name]'s situation and [name] says they have been "feeding them a little at a time" so it has not been too overwhelming. [name] [name] has no worries or concerns and says that she cared for her niece when she was dying of cancer and she had hospice care. [name] [name] is not afraid to die and says that she does not believe that she will suffer from her cancer. [name] says that she is surprised as her mother has always avoided having these conversations but has today began to share her wishes for funeral, etc. They both agree DNR is appropriate. [name] [name] is not sure what her main goal is but wants comfort and to spend time with her family. She is not insistent to go home and understands she may need more help than daughter, [name], can provide and she does not want to burden [place].   Of note, appetite is very poor and she has nausea that is relieved after paracentesis. I would recommend consideration of peritoneal drain for management of ascites. I also provided them with Living Will per request and have consulted chaplain for support/prayer and possibly assistance with completion of Living Will if desired.    Contacts/Participants in Discussion: Primary Decision Maker: Patient and then daughter, [name]   Goals of Care/Code Status/Advance Care Planning:   Code Status: DNR  Goal is comfort.    Symptom Management:   Ascites: Recommend palliative peritoneal drain to be placed for comfort.    Nausea: Relieved with paracentesis.   Abd pain: Mostly related to ascites. Recommend oxy IR 5 mg every 4 hours prn.   Psycho-social/Spiritual:   Support System: Support is good. Daughter is very supportive.   Desire for further Chaplaincy support: yes  Prognosis: < 6 months  Discharge Planning:  [place] for rehab with Palliative care service follow-up. Patient and daughter are unsure if home with [name] will be good and manageable. Discussed options for continued SNF with hospice or palliative, home with [facility] and hospice, or transition to hospice facility.        Chief Complaint: Abdominal distention  History of Present Illness: 81 y.o. female with a PMH of CAD, HTN, HLD, COPD, carotid artery disease s/p bilateral CEA, atrial fibrillation and chronic diastolic CHF, ascites (unknown cause - now known to be from malignancy), who was admitted 09/22/14 with increased abdominal distension, now status post paracentesis twice with concern for malignant ascites. On workup, the patient was found to have a gastric mass with biopsies confirming adenocarcinoma with oncology not recommending any chemotherapy or treatment options.   Primary Diagnoses  Present on Admission:  . Atrial fibrillation . CKD (chronic kidney disease), stage III . Hyperlipidemia . Hypertension . Malignant ascites . Chronic combined systolic and diastolic heart failure . Acute on chronic combined systolic and diastolic congestive heart failure . Gastric adenocarcinoma . Acute kidney injury  Palliative Review of Systems:   + abdominal distention/ascites, + decreased appetite, + weakness   I have  reviewed the medical record, interviewed the patient and family, and examined the patient. The following aspects are pertinent.  Past Medical History  Diagnosis Date  . Hypertension   . Hyperlipidemia   . COPD (chronic obstructive pulmonary disease)   . Osteoporosis   . H/O hiatal hernia   . Chronic  back pain   . CAD (coronary artery disease)     LHC 9/05:  pLAD less than 20%, D1 20-30%, ostial RCA 40-50%, proximal RCA 20%, EF 60%.  . C. difficile diarrhea   . Gout   . Atrial fibrillation     a. failed DCCV => b. amiodarone added => converted to NSR on Amiodarone;  c. Xarelto d/c'd 2/2 falling (wrist and hip Fx in 1/14)  . Carotid stenosis     s/p bilat CEA  . C. difficile colitis     10/2011;  11/2011  . Salmonella enteritis 08/2011    c/b septic shock  . Hx of echocardiogram     a. Echocardiogram 09/29/11: Mild LVH, EF 45-50%, diffuse HK, mild to moderate aortic stenosis, mean gradient 12 mmHg, moderate MAC, mild MR, moderate LAE, moderate RAE, question small secundum ASD with left to right flow not seen in 4 chamber view, PASP 35-39 (mild pulmonary hypertension). ;  b.  TEE on 10/04/11: Mild LVH, EF 55%, mild MR, no defect or PFO  . Pneumonia 11/2101  . PONV (postoperative nausea and vomiting)   . CHF (congestive heart failure)   . Chronic kidney disease   . GERD (gastroesophageal reflux disease)   . Hepatitis   . Anemia   . Hx of echocardiogram     Echo (09/2013): EF 55%, normal wall motion, mild aortic stenosis (mean 12 mm Hg), MAC, mild MR, mild LAE, mild PI, PASP 30 mm Hg  . Hx of cardiovascular stress test     Lexiscan Myoview (7/15):  No ischemia or infarct, EF 50%, Low Risk  . Arthritis     Neck  . PAF (paroxysmal atrial fibrillation)    History   Social History  . Marital Status: Widowed    Spouse Name: N/A  . Number of Children: N/A  . Years of Education: N/A   Social History Main Topics  . Smoking status: Former Smoker -- 1.00 packs/day for 20 years    Types: Cigarettes    Quit date: 09/01/1983  . Smokeless tobacco: Never Used  . Alcohol Use: No  . Drug Use: No  . Sexual Activity: No   Other Topics Concern  . None   Social History Narrative   Family History  Problem Relation Age of Onset  . Hemochromatosis Sister   . Diabetes Sister   . Hypertension  Sister   . Diabetes type II Mother   . Diabetes Mother   . Hypertension Mother   . Deep vein thrombosis Mother   . Hypertension Father   . Hypertension Brother   . Other Brother     Leg amputation-[place]  . Depression Brother   . Hemochromatosis Sister   . Heart attack Neg Hx   . Stroke Neg Hx   . Cancer Sister    Scheduled Meds: . allopurinol  100 mg Oral q morning - 10a  . bisoprolol  2.5 mg Oral Daily  . budesonide-formoterol  2 puff Inhalation BID  . cholecalciferol  1,000 Units Oral QHS  . cholecalciferol  2,000 Units Oral Daily  . clotrimazole  10 mg Oral 5 X Daily  . docusate sodium  100  mg Oral BID  . feeding supplement (ENSURE ENLIVE)  237 mL Oral BID BM  . folic acid  1 mg Oral Daily  . levofloxacin  250 mg Oral Q48H  . montelukast  10 mg Oral QHS  . multivitamin with minerals  1 tablet Oral Daily  . pantoprazole  80 mg Oral QPM  . saccharomyces boulardii  250 mg Oral BID  . sodium chloride  3 mL Intravenous Q12H  . thiamine  100 mg Oral Daily   Continuous Infusions: . heparin Stopped (10/01/14 1000)   PRN Meds:.sodium chloride, albuterol, bisacodyl, levalbuterol, metoCLOPramide (REGLAN) injection, morphine injection, ondansetron **OR** ondansetron (ZOFRAN) IV, polyethylene glycol, sodium chloride, traMADol Medications Prior to Admission:  Prior to Admission medications   Medication Sig Start Date End Date Taking? Authorizing Provider  allopurinol (ZYLOPRIM) 100 MG tablet Take 100 mg by mouth every morning.    Yes Historical Provider, MD  amiodarone (PACERONE) 200 MG tablet Take 1 tablet (200 mg total) by mouth daily. 04/07/14  Yes Erlene Quan, PA-C  apixaban (ELIQUIS) 2.5 MG TABS tablet Take 1 tablet (2.5 mg total) by mouth 2 (two) times daily. 04/14/14  Yes Burnell Blanks, MD  atorvastatin (LIPITOR) 40 MG tablet Take 40 mg by mouth at bedtime.   Yes Historical Provider, MD  budesonide-formoterol (SYMBICORT) 160-4.5 MCG/ACT inhaler Inhale 2 puffs into  the lungs 2 (two) times daily.   Yes Historical Provider, MD  cholecalciferol (VITAMIN D) 1000 UNITS tablet Take 1,000-2,000 Units by mouth 2 (two) times daily. Take 2000 units in the morning and 1000 units in the evening   Yes Historical Provider, MD  Ciprofloxacin (CIPRO PO) Take 1 tablet by mouth 2 (two) times daily.   Yes Historical Provider, MD  diltiazem (TIAZAC) 240 MG 24 hr capsule Take 240 mg by mouth daily.   Yes Historical Provider, MD  Docusate Calcium (STOOL SOFTENER PO) Take 1 tablet by mouth as needed (constipation).   Yes Historical Provider, MD  ferrous sulfate 325 (65 FE) MG tablet Take 325 mg by mouth daily.    Yes Historical Provider, MD  furosemide (LASIX) 20 MG tablet Take 1 tablet (20 mg total) by mouth daily. Patient taking differently: Take 20-40 mg by mouth daily as needed for fluid or edema.  07/25/14  Yes Burnell Blanks, MD  levalbuterol [facility] HFA) 45 MCG/ACT inhaler Inhale 2 puffs into the lungs every 6 (six) hours as needed for wheezing.    Yes Historical Provider, MD  levalbuterol Penne Lash) 0.63 MG/3ML nebulizer solution Take 0.63 mg by nebulization every 4 (four) hours as needed for wheezing or shortness of breath.   Yes Historical Provider, MD  montelukast (SINGULAIR) 10 MG tablet Take 10 mg by mouth at bedtime.   Yes Historical Provider, MD  omeprazole (PRILOSEC) 40 MG capsule Take 40 mg by mouth every evening.   Yes Historical Provider, MD  polyethylene glycol (MIRALAX / GLYCOLAX) packet Take 17 g by mouth daily as needed for mild constipation.   Yes Historical Provider, MD  raloxifene (EVISTA) 60 MG tablet Take 60 mg by mouth every morning.    Yes Historical Provider, MD  Saccharomyces boulardii (FLORASTOR PO) Take 1 tablet by mouth 2 (two) times daily.   Yes Historical Provider, MD  meclizine (ANTIVERT) 25 MG tablet Take 1 tablet (25 mg total) by mouth 2 (two) times daily as needed for dizziness. Patient not taking: Reported on 09/12/2014 07/06/13   Merryl Hacker, MD   Allergies  Allergen Reactions  .  Baby Powder [Methylbenzethonium] Shortness Of Breath  . Demerol [Meperidine] Hives  . Fexofenadine Other (See Comments)    dizziness  . Penicillins Hives    Tolerates Ceftriaxone.  Marland Kitchen Spiriva Handihaler [Tiotropium Bromide Monohydrate]     Caused chest pains   CBC:    Component Value Date/Time   WBC 10.4 10/01/2014 0435   HGB 10.5* 10/01/2014 0435   HCT 33.6* 10/01/2014 0435   HCT 34.9 09/22/2014 2115   PLT 310 10/01/2014 0435   MCV 90.1 10/01/2014 0435   NEUTROABS 8.6* 09/22/2014 1654   LYMPHSABS 0.8 09/22/2014 1654   MONOABS 0.6 09/22/2014 1654   EOSABS 0.0 09/22/2014 1654   BASOSABS 0.0 09/22/2014 1654   Comprehensive Metabolic Panel:    Component Value Date/Time   NA 133* 10/01/2014 0435   K 5.9* 10/01/2014 0435   CL 99* 10/01/2014 0435   CO2 24 10/01/2014 0435   BUN 57* 10/01/2014 0435   CREATININE 2.70* 10/01/2014 0435   GLUCOSE 79 10/01/2014 0435   CALCIUM 9.7 10/01/2014 0435   AST 24 09/30/2014 0438   ALT 19 09/30/2014 0438   ALKPHOS 50 09/30/2014 0438   BILITOT 0.6 09/30/2014 0438   PROT 5.3* 09/30/2014 0438   ALBUMIN 1.7* 09/30/2014 0438    Physical Exam:  Vital Signs: BP 86/51 mmHg  Pulse 94  Temp(Src) 97.3 F (36.3 C) (Oral)  Resp 18  Ht '5\' 1"'  (1.549 m)  Wt 74.435 kg (164 lb 1.6 oz)  BMI 31.02 kg/m2  SpO2 94% SpO2: SpO2: 94 % O2 Device: O2 Device: Nasal Cannula O2 Flow Rate: O2 Flow Rate (L/min): 2 L/min Intake/output summary:  Intake/Output Summary (Last 24 hours) at 10/01/14 1319 Last data filed at 10/01/14 0744  Gross per 24 hour  Intake    295 ml  Output    175 ml  Net    120 ml   LBM: Last BM Date: 10/01/14 Baseline Weight: Weight: 72.576 kg (160 lb) Most recent weight: Weight: 74.435 kg (164 lb 1.6 oz)  Exam Findings:  General: NAD, sitting up in bed, pleasant HEENT: [place]/AT CVS: RRR Resp: No labored breathing Abd: Soft, NT Neuro: Awake, alert, oriented x 3             Palliative Performance Scale: 30 %                Additional Data Reviewed: Recent Labs     09/30/14  0438  10/01/14  0435  WBC  9.7  10.4  HGB  10.6*  10.5*  PLT  285  310  NA  134*  133*  BUN  48*  57*  CREATININE  2.29*  2.70*     Time In: 1215 Time Out: 1330 Time Total: 2mn  Greater than 50%  of this time was spent counseling and coordinating care related to the above assessment and plan.   Signed by:  AVinie Sill NP Palliative Medicine Team Pager # [phone](M-F 8a-5p) Team Phone # [phone](Nights/Weekends)

## 2014-10-01 NOTE — Progress Notes (Signed)
CSW continuing to follow.   CSW noted that palliative care met with pt and pt daughter today. CSW received consult from palliative care regarding assisting pt daughter to determine what pt may be eligible for through [place] benefits for hospice.   CSW contacted pt daughter via telephone. Pt daughter stated that her understanding is that plan remains for pt to go to [facility] to participate with therapy and then further decisions will be made regarding plan once pt no longer able to participate in therapy. CSW discussed with pt daughter that CSW does not have access to pt VA benefits and it will be best for pt daughter to contact a case Freight forwarder or social worker at the [place] to clarify questions surrounding VA benefits. Pt daughter expressed understanding.   CSW contacted [facility] and updated facility. CSW sent [facility] updated clinicals via [facility].  CSW to continue to follow to provide support and assist with pt discharge to [facility] when pt medically ready for discharge.  Alison Murray, MSW, [place] Work [phone]

## 2014-10-01 NOTE — Progress Notes (Signed)
Progress Note   [name] [name] BMW:[id number] DOB: [birth year] DOA: 09/22/2014 PCP: [name]   Brief Narrative:   [name] [name] is an 81 y.o. female with a PMH of CAD, HTN, HLD, COPD, carotid artery disease s/p bilateral CEA, atrial fibrillation and chronic diastolic CHF, ascites (unknown cause), who was admitted 09/22/14 with increased abdominal distension, now status post paracentesis twice with concern for malignant ascites. On workup, the patient was found to have a gastric mass with biopsies confirming adenocarcinoma.  Assessment/Plan:   Principal Problem:   Malignant ascites secondary to gastric adenocarcinoma / possible SBP - Workup initiated after a recent CT showed evidence of omental thickening. - Initial paracentesis with cytology showed immunohistochemical evidence of possible malignant cells. - Underwent EGD 09/29/14 with biopsies of gastric mass. Pathology confirmed adenocarcinoma. - Oncology following with recommendations for hospice care with life expectancy less than 6 months. No role for chemotherapy. - Palliative care consultation requested. - Has been treated with empiric antibiotics for possible SBP, ascitic fluid cultures negative.  Active Problems:   COPD - Continue bronchodilation, Symbicort and Singulair.    Gout - Continue allopurinol.    Hyperlipidemia - Continue Lipitor.    Atrial fibrillation - Continue amiodarone, Zebeta, Cardizem and heparin.    Chronic combined systolic and diastolic heart failure / moderate aortic stenosis - Lasix currently on hold. - 2-D echo done 09/23/14 shows EF 30-35%, with worsening LV function and aortic stenosis compared to echo done 09/26/13. - Currently compensated off Lasix.    Hypertension - Continue Zebeta, Cardizem.     Acute kidney injury/CKD (chronic kidney disease), stage III - Lasix and Eliquis on hold, but renal function worse. Will check renal ultrasound.    Urinary retention - Discontinue  Foley and monitor, check bladder scan every 4 hours.    Hyperkalemia - Given 30 g of Kayexalate.    DVT Prophylaxis - Lovenox ordered.  Family Communication: Daughter updated by telephone. Disposition Plan: SNF likely, once disposition fully elucidated after palliative care meeting. Code Status: DNR  IV Access:    Peripheral IV   Procedures and diagnostic studies:   Ct Abdomen Pelvis Wo Contrast  09/12/2014   CLINICAL DATA:  81 year old female with abdominal pain and distention.  EXAM: CT ABDOMEN AND PELVIS WITHOUT CONTRAST  TECHNIQUE: Multidetector CT imaging of the abdomen and pelvis was performed following the standard protocol without IV contrast.  COMPARISON:  11/03/2011 CT  FINDINGS: Please note that parenchymal abnormalities may be missed without intravenous contrast.  Lower chest: Cardiomegaly, tiny bilateral pleural effusions and bibasilar atelectasis identified.  Hepatobiliary: The liver and gallbladder are unremarkable. There is no evidence of biliary dilatation.  Pancreas: Unremarkable  Spleen: Unremarkable  Adrenals/Urinary Tract: Bilateral renal atrophy identified. The adrenal glands and bladder are unremarkable.  Stomach/Bowel: There is possible circumferential wall thickening of the distal stomach. Colonic diverticulosis is identified without evidence of diverticulitis. No other focal bowel wall thickening is identified.  Vascular/Lymphatic: Abdominal aortic atherosclerotic calcifications are noted. There is no evidence of abdominal aortic aneurysm. No enlarged lymph nodes are noted.  Reproductive: The uterus and adnexal regions are unremarkable.  Other: A large amount of ascites is noted. Stranding in the omentum is present. No gross peritoneal nodules or masses are identified. There is no evidence of pneumoperitoneum.  Musculoskeletal: Surgical hardware within the proximal left femur is noted. Moderate -severe degenerative changes in the lumbar spine are identified. Grade 1  anterolisthesis of L4 on L5 is unchanged.  IMPRESSION: Large amount  of ascites without identifiable cause. Omental stranding may represent edema but tumor involvement of the omentum is not entirely excluded. No adnexal masses identified.  Possible distal stomach wall thickening. Consider direct inspection as clinically indicated.  Bilateral renal atrophy.  Cardiomegaly, tiny bilateral pleural effusions and mild bibasilar atelectasis.   Electronically Signed   By: Margarette Canada M.D.   On: 09/12/2014 18:34   US Paracentesis  09/23/2014   INDICATION: Recurrent ascites, request for diagnostic and therapeutic paracentesis.  EXAM: ULTRASOUND-GUIDED PARACENTESIS  COMPARISON:  Paracentesis 09/18/14.  MEDICATIONS: None.  COMPLICATIONS: None immediate  TECHNIQUE: Informed written consent was obtained from the patient after a discussion of the risks, benefits and alternatives to treatment. A timeout was performed prior to the initiation of the procedure.  Initial ultrasound scanning demonstrates a moderate amount of ascites within the right lower abdominal quadrant. The right lower abdomen was prepped and draped in the usual sterile fashion. 1% lidocaine was used for local anesthesia.  Under direct ultrasound guidance, a 19 gauge, 10-cm, Yueh catheter was introduced. An ultrasound image was saved for documentation purposed. The paracentesis was performed. The catheter was removed and a dressing was applied. The patient tolerated the procedure well without immediate post procedural complication.  FINDINGS: A total of approximately 3.1 liters of serous fluid was removed. Samples were sent to the laboratory as requested by the clinical team.  IMPRESSION: Successful ultrasound-guided paracentesis yielding 3.1 liters of peritoneal fluid.  Read By:  Tsosie Billing PA-C   Electronically Signed   By: Corrie Mckusick D.O.   On: 09/23/2014 15:33   US Paracentesis  09/18/2014   INDICATION: Intra-abdominal ascites of uncertain etiology.  Please perform ultrasound-guided paracentesis for diagnostic and therapeutic purposes.  EXAM: ULTRASOUND-GUIDED PARACENTESIS  COMPARISON:  None.  MEDICATIONS: 10 cc 1% lidocaine  COMPLICATIONS: None immediate  TECHNIQUE: Informed written consent was obtained from the patient after a discussion of the risks, benefits and alternatives to treatment. A timeout was performed prior to the initiation of the procedure.  Initial ultrasound scanning demonstrates a large amount of ascites within the right lower abdominal quadrant. The right lower abdomen was prepped and draped in the usual sterile fashion. 1% lidocaine with epinephrine was used for local anesthesia. Under direct ultrasound guidance, a 19 gauge, 7-cm, Yueh catheter was introduced. An ultrasound image was saved for documentation purposed. The paracentesis was performed. The catheter was removed and a dressing was applied. The patient tolerated the procedure well without immediate post procedural complication.  FINDINGS: A total of approximately 3.8 liters of yellow fluid was removed. Samples were sent to the laboratory as requested by the clinical team.  IMPRESSION: Successful ultrasound-guided paracentesis yielding 3.8 liters of peritoneal fluid.  50 gr IV albumin during procedure per MD.  Read by:  Lavonia Drafts [facility]   Electronically Signed   By: Sandi Mariscal M.D.   On: 09/18/2014 15:46   Dg Chest Port 1 View  09/23/2014   CLINICAL DATA:  Shortness of breath.  Chest congestion.  EXAM: PORTABLE CHEST - 1 VIEW  COMPARISON:  Chest x-rays dated 06/21/2014, 02/26/2014, 07/24/2013 and 07/06/2013 and 04/12/2012  FINDINGS: There is cardiomegaly with new pulmonary vascular congestion and very slight accentuation of the interstitial markings. No discrete effusions. No acute osseous abnormality.  IMPRESSION: Pulmonary vascular congestion with minimal interstitial edema.   Electronically Signed   By: Lorriane Shire M.D.   On: 09/23/2014 14:30   Dg Abd Portable  1v  09/23/2014   CLINICAL DATA:  Abdominal distention and pain.  Ascites.  EXAM: PORTABLE ABDOMEN - 1 VIEW  COMPARISON:  CT scan dated 09/12/2014  FINDINGS: Bowel gas pattern is normal. Residual contrast in the appendix from the recent CT scan. Diffuse degenerative changes and scoliosis in the lumbar spine. Chronic degenerative or posttraumatic changes of the right pubic body. No acute osseous abnormality. Healed left hip fracture.  IMPRESSION: No acute abnormality of the abdomen.   Electronically Signed   By: Lorriane Shire M.D.   On: 09/23/2014 14:43     Medical Consultants:    Gastroenterology  Cardiology  Oncology  Surgery  Anti-Infectives:    Vancomycin 09/24/2014> 09/25/14  Ceftazidime 09/24/2014> 09/27/2014  Levaquin 09/28/2014>  Subjective:   Posey Pronto Lascola reports some abdominal pain, mostly with activity.  No nausea or vomiting today.  Bowels moved this morning.  Had a paracentesis earlier today and reports improvement in abdominal discomfort since then.  Objective:    Filed Vitals:   09/30/14 1352 09/30/14 2023 09/30/14 2105 10/01/14 0509  BP: 84/46  87/57 76/43  Pulse: 88  99 94  Temp: 97.8 F (36.6 C)  98.2 F (36.8 C) 97.3 F (36.3 C)  TempSrc: Oral  Oral Oral  Resp: 20  20 18   Height:      Weight:    74.435 kg (164 lb 1.6 oz)  SpO2: 100% 94% 99% 100%    Intake/Output Summary (Last 24 hours) at 10/01/14 0827 Last data filed at 10/01/14 0744  Gross per 24 hour  Intake    295 ml  Output    275 ml  Net     20 ml    Exam: Gen:  NAD Cardiovascular:  HSIR Respiratory:  Lungs CTAB Gastrointestinal:  Abdomen softly distended Extremities:  No C/E/C   Data Reviewed:    Labs: Basic Metabolic Panel:  Recent Labs Lab 09/27/14 0502 09/28/14 0518 09/29/14 0204 09/30/14 0438 10/01/14 0435  NA 133* 135 133* 134* 133*  K 4.4 4.5 4.7 5.2* 5.9*  CL 98* 101 102 100* 99*  CO2 28 26 25 24 24   GLUCOSE 102* 87 85 78 79  BUN 42* 43* 45* 48* 57*   CREATININE 2.46* 2.11* 1.95* 2.29* 2.70*  CALCIUM 8.7* 8.9 8.7* 9.4 9.7   GFR Estimated Creatinine Clearance: 13.3 mL/min (by C-G formula based on Cr of 2.7). Liver Function Tests:  Recent Labs Lab 09/29/14 0204 09/30/14 0438  AST 21 24  ALT 15 19  ALKPHOS 51 50  BILITOT 0.3 0.6  PROT 5.1* 5.3*  ALBUMIN 1.8* 1.7*   CBC:  Recent Labs Lab 09/27/14 0502 09/28/14 0518 09/29/14 0204 09/30/14 0438 10/01/14 0435  WBC 8.3 9.5 9.3 9.7 10.4  HGB 10.4* 10.0* 10.5* 10.6* 10.5*  HCT 33.0* 32.5* 32.8* 33.7* 33.6*  MCV 88.5 88.6 87.7 89.6 90.1  PLT 244 265 256 285 310   CBG:  Recent Labs Lab 09/28/14 0729 09/29/14 0735 09/30/14 0730 10/01/14 0737 10/01/14 0816  GLUCAP 88 86 67 66 68   Microbiology Recent Results (from the past 240 hour(s))  AFB culture with smear     Status: None (Preliminary result)   Collection Time: 09/23/14 10:39 AM  Result Value Ref Range Status   Specimen Description ABDOMEN  Final   Special Requests NONE  Final   Acid Fast Smear   Final    NO ACID FAST BACILLI SEEN Performed at [facility]    Culture   Final    CULTURE WILL BE EXAMINED FOR 6 WEEKS  BEFORE ISSUING A FINAL REPORT Performed at [facility]    Report Status PENDING  Incomplete  Culture, body fluid-bottle     Status: None   Collection Time: 09/23/14 10:39 AM  Result Value Ref Range Status   Specimen Description FLUID ABDOMEN PERITONEAL  Final   Special Requests NONE  Final   Culture NO GROWTH 5 DAYS  Final   Report Status 09/28/2014 FINAL  Final  Gram stain     Status: None   Collection Time: 09/23/14 10:39 AM  Result Value Ref Range Status   Specimen Description FLUID ABDOMEN PERITONEAL  Final   Special Requests NONE  Final   Gram Stain   Final    ABUNDANT WBC PRESENT,BOTH PMN AND MONONUCLEAR NO ORGANISMS SEEN    Report Status 09/23/2014 FINAL  Final  Clostridium Difficile by PCR (not at [facility])     Status: None   Collection Time: 09/26/14 10:26 PM   Result Value Ref Range Status   C difficile by pcr NEGATIVE NEGATIVE Final  Culture, Urine     Status: None   Collection Time: 09/28/14 11:36 PM  Result Value Ref Range Status   Specimen Description URINE, CLEAN CATCH  Final   Special Requests NONE  Final   Culture   Final    NO GROWTH 1 DAY Performed at [facility]    Report Status 09/30/2014 FINAL  Final     Medications:   . allopurinol  100 mg Oral q morning - 10a  . bisoprolol  2.5 mg Oral Daily  . budesonide-formoterol  2 puff Inhalation BID  . cholecalciferol  1,000 Units Oral QHS  . cholecalciferol  2,000 Units Oral Daily  . clotrimazole  10 mg Oral 5 X Daily  . docusate sodium  100 mg Oral BID  . feeding supplement (ENSURE ENLIVE)  237 mL Oral BID BM  . folic acid  1 mg Oral Daily  . levofloxacin  250 mg Oral Q48H  . montelukast  10 mg Oral QHS  . multivitamin with minerals  1 tablet Oral Daily  . pantoprazole  80 mg Oral QPM  . saccharomyces boulardii  250 mg Oral BID  . sodium chloride  3 mL Intravenous Q12H  . thiamine  100 mg Oral Daily   Continuous Infusions: . heparin 1,450 Units/hr (09/30/14 1412)    Time spent: 35 minutes.  The patient is medically complex and requires high complexity decision making and coordination of care with multiple specialists.    LOS: 9 days   [facility] Pager [phone]. If unable to reach me by pager, please call my cell phone at [phone].  *Please refer to [email or web address], password [email or web address] to get updated schedule on who will round on this patient, as hospitalists switch teams weekly. If 7PM-7AM, please contact night-coverage at [email or web address], password [email or web address] for any overnight needs.  10/01/2014, 8:27 AM

## 2014-10-01 NOTE — Procedures (Signed)
Successful US guided paracentesis from RLQ.  Yielded 4 liters of serous fluid.  No immediate complications.  Pt tolerated well.   Specimen was not sent for labs.  Tsosie Billing D PA-C 10/01/2014 12:20 PM

## 2014-10-02 LAB — BASIC METABOLIC PANEL
Anion gap: 6 (ref 5–15)
BUN: 60 mg/dL — ABNORMAL HIGH (ref 6–20)
CHLORIDE: 101 mmol/L (ref 101–111)
CO2: 28 mmol/L (ref 22–32)
Calcium: 9.9 mg/dL (ref 8.9–10.3)
Creatinine, Ser: 2.35 mg/dL — ABNORMAL HIGH (ref 0.44–1.00)
GFR calc Af Amer: 20 mL/min — ABNORMAL LOW (ref >60)
GFR, EST NON AFRICAN AMERICAN: 17 mL/min — AB (ref >60)
Glucose, Bld: 115 mg/dL — ABNORMAL HIGH (ref 65–99)
Potassium: 5.2 mmol/L — ABNORMAL HIGH (ref 3.5–5.1)
SODIUM: 135 mmol/L (ref 135–145)

## 2014-10-02 LAB — CBC
HEMATOCRIT: 31.8 % — AB (ref 36.0–46.0)
Hemoglobin: 10.1 g/dL — ABNORMAL LOW (ref 12.0–15.0)
MCH: 28.3 pg (ref 26.0–34.0)
MCHC: 31.8 g/dL (ref 30.0–36.0)
MCV: 89.1 fL (ref 78.0–100.0)
PLATELETS: 319 10*3/uL (ref 150–400)
RBC: 3.57 MIL/uL — AB (ref 3.87–5.11)
RDW: 15.5 % (ref 11.5–15.5)
WBC: 9.9 10*3/uL (ref 4.0–10.5)

## 2014-10-02 LAB — HEPARIN LEVEL (UNFRACTIONATED): Heparin Unfractionated: 0.45 IU/mL (ref 0.30–0.70)

## 2014-10-02 MED ORDER — TRAMADOL HCL 50 MG PO TABS: 100.0000 mg | ORAL_TABLET | Freq: Four times a day (QID) | ORAL | Status: DC | PRN

## 2014-10-02 MED ORDER — ADULT MULTIVITAMIN W/MINERALS CH: 1.0000 | ORAL_TABLET | Freq: Every day | ORAL | Status: DC

## 2014-10-02 MED ORDER — FOLIC ACID 1 MG PO TABS
1.0000 mg | ORAL_TABLET | Freq: Every day | ORAL | Status: DC
Start: 2014-10-02 — End: 2014-10-10

## 2014-10-02 MED ORDER — ONDANSETRON HCL 4 MG PO TABS: 4.0000 mg | ORAL_TABLET | Freq: Four times a day (QID) | ORAL | Status: AC | PRN

## 2014-10-02 MED ORDER — SODIUM POLYSTYRENE SULFONATE 15 GM/60ML PO SUSP
15.0000 g | Freq: Once | ORAL | Status: AC
  Administered 2014-10-02: 15 g via ORAL
  Filled 2014-10-02: qty 60

## 2014-10-02 MED ORDER — CLOTRIMAZOLE 10 MG MT TROC: 10.0000 mg | Freq: Every day | OROMUCOSAL | Status: DC

## 2014-10-02 MED ORDER — ENSURE ENLIVE PO LIQD
237.0000 mL | Freq: Two times a day (BID) | ORAL | Status: DC
Start: 2014-10-02 — End: 2014-10-10

## 2014-10-02 MED ORDER — THIAMINE HCL 100 MG PO TABS: 100.0000 mg | ORAL_TABLET | Freq: Every day | ORAL | Status: DC

## 2014-10-02 NOTE — Discharge Summary (Signed)
Physician Discharge Summary  [name] [name] XLK:[id number] DOB: Jan 18, 1924 DOA: 09/22/2014  PCP: [name]  Admit date: 09/22/2014 Discharge date: 10/02/2014   Recommendations for Outpatient Follow-Up:   1. Interventional radiology will call with an appointment time to have tunneled peritoneal catheter placed. 2. Note: The patient was taken off blood thinners.  Please start low dose ASA 81 mg daily 24 hours after peritoneal catheter placed. 3. Monitor for recurrent urinary retention, bladder scan and I&O cath PRN. 4. Recheck BMET in 48 hours, assure stability of renal function/electrolytes.   Discharge Diagnosis:   Principal Problem:    Malignant ascites Active Problems:    Hyperlipidemia    Atrial fibrillation    Chronic combined systolic and diastolic heart failure    Hypertension    CKD (chronic kidney disease), stage III    CHF exacerbation    Ascites    Acute on chronic combined systolic and diastolic congestive heart failure    Urinary retention    Gastric adenocarcinoma    Hyperkalemia    Acute kidney injury    Palliative care encounter   Discharge disposition:  SNF: [facility]  Discharge Condition: Improved.  Diet recommendation: Low sodium, heart healthy. Low potassium.   History of Present Illness:   [name] [name] is an 81 y.o. female with a PMH of CAD, HTN, HLD, COPD, carotid artery disease s/p bilateral CEA, atrial fibrillation and chronic diastolic CHF, ascites (unknown cause), who was admitted 09/22/14 with increased abdominal distension, now status post paracentesis twice with concern for malignant ascites. On workup, the patient was found to have a gastric mass with biopsies confirming adenocarcinoma.  Hospital Course by Problem:   Principal Problem:  Malignant ascites secondary to gastric adenocarcinoma / possible SBP - Workup initiated after a recent CT showed evidence of omental thickening. - Initial paracentesis with  cytology showed immunohistochemical evidence of possible malignant cells. - Underwent EGD 09/29/14 with biopsies of gastric mass. Pathology confirmed adenocarcinoma. - Oncology following with recommendations for hospice care with life expectancy less than 6 months. No role for chemotherapy. - Palliative care consulted on patient 10/01/14. - Has been treated with empiric antibiotics for possible SBP, ascitic fluid cultures negative. - IR consultation for tunneled catheter given need for frequent paracentesis. Risks/benefits discussed.  Scheduled as outpatient in 1 week.  Office will call with appt time.  Active Problems:  COPD - Continue bronchodilation, Symbicort and Singulair.   Gout - Continue allopurinol.   Hyperlipidemia - Continue Lipitor.   Atrial fibrillation - Continue amiodarone, Zebeta, Cardizem and heparin.   Chronic combined systolic and diastolic heart failure / moderate aortic stenosis - Lasix currently on hold. - 2-D echo done 09/23/14 shows EF 30-35%, with worsening LV function and aortic stenosis compared to echo done 09/26/13. - Currently compensated off Lasix. - OK to resume lasix at d/c.   Hypertension - Resume home medications at d/c.   Acute kidney injury/CKD (chronic kidney disease), stage III - Renal ultrasound negative for hydronephrosis.   Urinary retention - Discontinued Foley 10/01/14.  Monitor for recurrent problems with retention.   Hyperkalemia - Given 30 g of Kayexalate yesterday, K+ 5.2 today (better). Will give another 15 g. - Low potassium diet recommended.    Medical Consultants:    Gastroenterology  Cardiology  Oncology  Surgery   Discharge Exam:   Filed Vitals:   10/02/14 0521  BP: 95/56  Pulse: 106  Temp: 97.3 F (36.3 C)  Resp: 16   Filed Vitals:   10/01/14  2035 10/02/14 0521 10/02/14 0723 10/02/14 0807  BP: 77/43 95/56    Pulse: 90 106    Temp: 97.3 F (36.3 C) 97.3 F (36.3 C)    TempSrc: Oral Oral     Resp: 16 16    Height:      Weight:   70.308 kg (155 lb)   SpO2: 100% 100%  98%    Gen:  NAD Cardiovascular:  RRR, No M/R/G Respiratory: Lungs CTAB Gastrointestinal: Abdomen soft, NT/ND with normal active bowel sounds. Extremities: No C/E/C   The results of significant diagnostics from this hospitalization (including imaging, microbiology, ancillary and laboratory) are listed below for reference.     Procedures and Diagnostic Studies:   Ct Abdomen Pelvis Wo Contrast  09/12/2014   CLINICAL DATA:  81 year old female with abdominal pain and distention.  EXAM: CT ABDOMEN AND PELVIS WITHOUT CONTRAST  TECHNIQUE: Multidetector CT imaging of the abdomen and pelvis was performed following the standard protocol without IV contrast.  COMPARISON:  11/03/2011 CT  FINDINGS: Please note that parenchymal abnormalities may be missed without intravenous contrast.  Lower chest: Cardiomegaly, tiny bilateral pleural effusions and bibasilar atelectasis identified.  Hepatobiliary: The liver and gallbladder are unremarkable. There is no evidence of biliary dilatation.  Pancreas: Unremarkable  Spleen: Unremarkable  Adrenals/Urinary Tract: Bilateral renal atrophy identified. The adrenal glands and bladder are unremarkable.  Stomach/Bowel: There is possible circumferential wall thickening of the distal stomach. Colonic diverticulosis is identified without evidence of diverticulitis. No other focal bowel wall thickening is identified.  Vascular/Lymphatic: Abdominal aortic atherosclerotic calcifications are noted. There is no evidence of abdominal aortic aneurysm. No enlarged lymph nodes are noted.  Reproductive: The uterus and adnexal regions are unremarkable.  Other: A large amount of ascites is noted. Stranding in the omentum is present. No gross peritoneal nodules or masses are identified. There is no evidence of pneumoperitoneum.  Musculoskeletal: Surgical hardware within the proximal left femur is noted. Moderate  -severe degenerative changes in the lumbar spine are identified. Grade 1 anterolisthesis of L4 on L5 is unchanged.  IMPRESSION: Large amount of ascites without identifiable cause. Omental stranding may represent edema but tumor involvement of the omentum is not entirely excluded. No adnexal masses identified.  Possible distal stomach wall thickening. Consider direct inspection as clinically indicated.  Bilateral renal atrophy.  Cardiomegaly, tiny bilateral pleural effusions and mild bibasilar atelectasis.   Electronically Signed   By: Margarette Canada M.D.   On: 09/12/2014 18:34   US Renal  10/02/2014   CLINICAL DATA:  Acute renal insufficiency  EXAM: RENAL / URINARY TRACT ULTRASOUND COMPLETE  COMPARISON:  None.  FINDINGS: Right Kidney:  Length: 9.7 cm. Echogenicity within normal limits. No mass or hydronephrosis visualized.  Left Kidney:  Length: 9.7 cm. Echogenicity within normal limits. No mass or hydronephrosis visualized.  Bladder:  Appears normal for degree of bladder distention.  Moderate volume ascites noted.  IMPRESSION: Negative for hydronephrosis.  Ascites noted.   Electronically Signed   By: Andreas Newport M.D.   On: 10/02/2014 03:12   US Paracentesis  10/01/2014   INDICATION: Ascites, request for paracentesis.  EXAM: ULTRASOUND-GUIDED PARACENTESIS  COMPARISON:  Paracentesis 09/23/14.  MEDICATIONS: None.  COMPLICATIONS: None immediate  TECHNIQUE: Informed written consent was obtained from the patient after a discussion of the risks, benefits and alternatives to treatment. A timeout was performed prior to the initiation of the procedure.  Initial ultrasound scanning demonstrates a large amount of ascites within the right lower abdominal quadrant. The right lower  abdomen was prepped and draped in the usual sterile fashion. 1% lidocaine was used for local anesthesia.  Under direct ultrasound guidance, a 19 gauge, 7-cm, Yueh catheter was introduced. An ultrasound image was saved for documentation  purposed. The paracentesis was performed. The catheter was removed and a dressing was applied. The patient tolerated the procedure well without immediate post procedural complication.  FINDINGS: A total of approximately 4 liters of serous fluid was removed.  IMPRESSION: Successful ultrasound-guided paracentesis yielding 4 liters of peritoneal fluid.  Read By:  Tsosie Billing PA-C   Electronically Signed   By: Aletta Edouard M.D.   On: 10/01/2014 13:38   US Paracentesis  09/23/2014   INDICATION: Recurrent ascites, request for diagnostic and therapeutic paracentesis.  EXAM: ULTRASOUND-GUIDED PARACENTESIS  COMPARISON:  Paracentesis 09/18/14.  MEDICATIONS: None.  COMPLICATIONS: None immediate  TECHNIQUE: Informed written consent was obtained from the patient after a discussion of the risks, benefits and alternatives to treatment. A timeout was performed prior to the initiation of the procedure.  Initial ultrasound scanning demonstrates a moderate amount of ascites within the right lower abdominal quadrant. The right lower abdomen was prepped and draped in the usual sterile fashion. 1% lidocaine was used for local anesthesia.  Under direct ultrasound guidance, a 19 gauge, 10-cm, Yueh catheter was introduced. An ultrasound image was saved for documentation purposed. The paracentesis was performed. The catheter was removed and a dressing was applied. The patient tolerated the procedure well without immediate post procedural complication.  FINDINGS: A total of approximately 3.1 liters of serous fluid was removed. Samples were sent to the laboratory as requested by the clinical team.  IMPRESSION: Successful ultrasound-guided paracentesis yielding 3.1 liters of peritoneal fluid.  Read By:  Tsosie Billing PA-C   Electronically Signed   By: Corrie Mckusick D.O.   On: 09/23/2014 15:33   US Paracentesis  09/18/2014   INDICATION: Intra-abdominal ascites of uncertain etiology. Please perform ultrasound-guided paracentesis for  diagnostic and therapeutic purposes.  EXAM: ULTRASOUND-GUIDED PARACENTESIS  COMPARISON:  None.  MEDICATIONS: 10 cc 1% lidocaine  COMPLICATIONS: None immediate  TECHNIQUE: Informed written consent was obtained from the patient after a discussion of the risks, benefits and alternatives to treatment. A timeout was performed prior to the initiation of the procedure.  Initial ultrasound scanning demonstrates a large amount of ascites within the right lower abdominal quadrant. The right lower abdomen was prepped and draped in the usual sterile fashion. 1% lidocaine with epinephrine was used for local anesthesia. Under direct ultrasound guidance, a 19 gauge, 7-cm, Yueh catheter was introduced. An ultrasound image was saved for documentation purposed. The paracentesis was performed. The catheter was removed and a dressing was applied. The patient tolerated the procedure well without immediate post procedural complication.  FINDINGS: A total of approximately 3.8 liters of yellow fluid was removed. Samples were sent to the laboratory as requested by the clinical team.  IMPRESSION: Successful ultrasound-guided paracentesis yielding 3.8 liters of peritoneal fluid.  50 gr IV albumin during procedure per MD.  Read by:  Lavonia Drafts [facility]   Electronically Signed   By: Sandi Mariscal M.D.   On: 09/18/2014 15:46   Dg Chest Port 1 View  09/23/2014   CLINICAL DATA:  Shortness of breath.  Chest congestion.  EXAM: PORTABLE CHEST - 1 VIEW  COMPARISON:  Chest x-rays dated 06/21/2014, 02/26/2014, 07/24/2013 and 07/06/2013 and 04/12/2012  FINDINGS: There is cardiomegaly with new pulmonary vascular congestion and very slight accentuation of the interstitial markings. No discrete  effusions. No acute osseous abnormality.  IMPRESSION: Pulmonary vascular congestion with minimal interstitial edema.   Electronically Signed   By: Lorriane Shire M.D.   On: 09/23/2014 14:30   Dg Abd Portable 1v  09/23/2014   CLINICAL DATA:  Abdominal distention  and pain.  Ascites.  EXAM: PORTABLE ABDOMEN - 1 VIEW  COMPARISON:  CT scan dated 09/12/2014  FINDINGS: Bowel gas pattern is normal. Residual contrast in the appendix from the recent CT scan. Diffuse degenerative changes and scoliosis in the lumbar spine. Chronic degenerative or posttraumatic changes of the right pubic body. No acute osseous abnormality. Healed left hip fracture.  IMPRESSION: No acute abnormality of the abdomen.   Electronically Signed   By: Lorriane Shire M.D.   On: 09/23/2014 14:43     Labs:   Basic Metabolic Panel:  Recent Labs Lab 09/28/14 0518 09/29/14 0204 09/30/14 0438 10/01/14 0435 10/02/14 0550  NA 135 133* 134* 133* 135  K 4.5 4.7 5.2* 5.9* 5.2*  CL 101 102 100* 99* 101  CO2 26 25 24 24 28   GLUCOSE 87 85 78 79 115*  BUN 43* 45* 48* 57* 60*  CREATININE 2.11* 1.95* 2.29* 2.70* 2.35*  CALCIUM 8.9 8.7* 9.4 9.7 9.9   GFR Estimated Creatinine Clearance: 14.8 mL/min (by C-G formula based on Cr of 2.35). Liver Function Tests:  Recent Labs Lab 09/29/14 0204 09/30/14 0438  AST 21 24  ALT 15 19  ALKPHOS 51 50  BILITOT 0.3 0.6  PROT 5.1* 5.3*  ALBUMIN 1.8* 1.7*   CBC:  Recent Labs Lab 09/28/14 0518 09/29/14 0204 09/30/14 0438 10/01/14 0435 10/02/14 0550  WBC 9.5 9.3 9.7 10.4 9.9  HGB 10.0* 10.5* 10.6* 10.5* 10.1*  HCT 32.5* 32.8* 33.7* 33.6* 31.8*  MCV 88.6 87.7 89.6 90.1 89.1  PLT 265 256 285 310 319   CBG:  Recent Labs Lab 09/29/14 0735 09/30/14 0730 10/01/14 0737 10/01/14 0816 10/01/14 0957  GLUCAP 86 67 66 68 74   Microbiology Recent Results (from the past 240 hour(s))  AFB culture with smear     Status: None (Preliminary result)   Collection Time: 09/23/14 10:39 AM  Result Value Ref Range Status   Specimen Description ABDOMEN  Final   Special Requests NONE  Final   Acid Fast Smear   Final    NO ACID FAST BACILLI SEEN Performed at [facility]    Culture   Final    CULTURE WILL BE EXAMINED FOR 6 WEEKS BEFORE  ISSUING A FINAL REPORT Performed at [facility]    Report Status PENDING  Incomplete  Culture, body fluid-bottle     Status: None   Collection Time: 09/23/14 10:39 AM  Result Value Ref Range Status   Specimen Description FLUID ABDOMEN PERITONEAL  Final   Special Requests NONE  Final   Culture NO GROWTH 5 DAYS  Final   Report Status 09/28/2014 FINAL  Final  Gram stain     Status: None   Collection Time: 09/23/14 10:39 AM  Result Value Ref Range Status   Specimen Description FLUID ABDOMEN PERITONEAL  Final   Special Requests NONE  Final   Gram Stain   Final    ABUNDANT WBC PRESENT,BOTH PMN AND MONONUCLEAR NO ORGANISMS SEEN    Report Status 09/23/2014 FINAL  Final  Clostridium Difficile by PCR (not at [facility])     Status: None   Collection Time: 09/26/14 10:26 PM  Result Value Ref Range Status   C difficile by  pcr NEGATIVE NEGATIVE Final  Culture, Urine     Status: None   Collection Time: 09/28/14 11:36 PM  Result Value Ref Range Status   Specimen Description URINE, CLEAN CATCH  Final   Special Requests NONE  Final   Culture   Final    NO GROWTH 1 DAY Performed at [facility]    Report Status 09/30/2014 FINAL  Final     Discharge Instructions:   Discharge Instructions    ([place]) Call MD:  Anytime you have any of the following symptoms: 1) 3 pound weight gain in 24 hours or 5 pounds in 1 week 2) shortness of breath, with or without a dry hacking cough 3) swelling in the hands, feet or stomach 4) if you have to sleep on extra pillows at night in order to breathe.    Complete by:  As directed      Call MD for:  extreme fatigue    Complete by:  As directed      Call MD for:  persistant nausea and vomiting    Complete by:  As directed      Call MD for:  severe uncontrolled pain    Complete by:  As directed      Diet - low sodium heart healthy    Complete by:  As directed      Increase activity slowly    Complete by:  As directed      Walk  with assistance    Complete by:  As directed      Walker     Complete by:  As directed             Medication List    STOP taking these medications        apixaban 2.5 MG Tabs tablet  Commonly known as:  ELIQUIS     CIPRO PO     meclizine 25 MG tablet  Commonly known as:  ANTIVERT      TAKE these medications        allopurinol 100 MG tablet  Commonly known as:  ZYLOPRIM  Take 100 mg by mouth every morning.     amiodarone 200 MG tablet  Commonly known as:  PACERONE  Take 1 tablet (200 mg total) by mouth daily.     atorvastatin 40 MG tablet  Commonly known as:  LIPITOR  Take 40 mg by mouth at bedtime.     budesonide-formoterol 160-4.5 MCG/ACT inhaler  Commonly known as:  SYMBICORT  Inhale 2 puffs into the lungs 2 (two) times daily.     cholecalciferol 1000 UNITS tablet  Commonly known as:  VITAMIN D  Take 1,000-2,000 Units by mouth 2 (two) times daily. Take 2000 units in the morning and 1000 units in the evening     clotrimazole 10 MG troche  Commonly known as:  MYCELEX  Take 1 tablet (10 mg total) by mouth 5 (five) times daily.     diltiazem 240 MG 24 hr capsule  Commonly known as:  TIAZAC  Take 240 mg by mouth daily.     feeding supplement (ENSURE ENLIVE) Liqd  Take 237 mLs by mouth 2 (two) times daily between meals.     ferrous sulfate 325 (65 FE) MG tablet  Take 325 mg by mouth daily.     FLORASTOR PO  Take 1 tablet by mouth 2 (two) times daily.     folic acid 1 MG tablet  Commonly known as:  [employer]  Take 1 tablet (1 mg total) by mouth daily.     furosemide 20 MG tablet  Commonly known as:  LASIX  Take 1 tablet (20 mg total) by mouth daily.     levalbuterol 0.63 MG/3ML nebulizer solution  Commonly known as:  XOPENEX  Take 0.63 mg by nebulization every 4 (four) hours as needed for wheezing or shortness of breath.     levalbuterol 45 MCG/ACT inhaler  Commonly known as:  XOPENEX HFA  Inhale 2 puffs into the lungs every 6 (six) hours as needed  for wheezing.     montelukast 10 MG tablet  Commonly known as:  SINGULAIR  Take 10 mg by mouth at bedtime.     multivitamin with minerals Tabs tablet  Take 1 tablet by mouth daily.     omeprazole 40 MG capsule  Commonly known as:  PRILOSEC  Take 40 mg by mouth every evening.     ondansetron 4 MG tablet  Commonly known as:  ZOFRAN  Take 1 tablet (4 mg total) by mouth every 6 (six) hours as needed for nausea.     polyethylene glycol packet  Commonly known as:  MIRALAX / GLYCOLAX  Take 17 g by mouth daily as needed for mild constipation.     raloxifene 60 MG tablet  Commonly known as:  EVISTA  Take 60 mg by mouth every morning.     STOOL SOFTENER PO  Take 1 tablet by mouth as needed (constipation).     thiamine 100 MG tablet  Take 1 tablet (100 mg total) by mouth daily.     traMADol 50 MG tablet  Commonly known as:  ULTRAM  Take 2 tablets (100 mg total) by mouth every 6 (six) hours as needed for moderate pain.           Follow-up Information    Schedule an appointment as soon as possible for a visit with WILLARD,JENNIFER, PA-C.   Specialty:  Family Medicine   Why:  As needed   Contact information:   [address] [address] [phone]       Follow up with Interventional radiology. Schedule an appointment as soon as possible for a visit in 1 week.   Why:  Office will call with appt time.       Time coordinating discharge: 35 minutes.  Signed:  Nuriyah Hanline  Pager [phone] [facility] 10/02/2014, 11:34 AM

## 2014-10-02 NOTE — Progress Notes (Signed)
ANTICOAGULATION CONSULT NOTE - Follow Up Consult  Pharmacy Consult for Heparin Indication: atrial fibrillation (Eliquis on hold)  Allergies  Allergen Reactions  . Baby Powder [Methylbenzethonium] Shortness Of Breath  . Demerol [Meperidine] Hives  . Fexofenadine Other (See Comments)    dizziness  . Penicillins Hives    Tolerates Ceftriaxone.  Marland Kitchen Spiriva Handihaler [Tiotropium Bromide Monohydrate]     Caused chest pains    Patient Measurements: Height: 5\' 1"  (154.9 cm) Weight: 155 lb (70.308 kg) IBW/kg (Calculated) : 47.8 Heparin Dosing Weight: 63 kg  Vital Signs: Temp: 97.3 F (36.3 C) (07/28 0521) Temp Source: Oral (07/28 0521) BP: 95/56 mmHg (07/28 0521) Pulse Rate: 106 (07/28 0521)  Labs:  Recent Labs  09/30/14 0438 09/30/14 0630 10/01/14 0425 10/01/14 0435 10/02/14 0550  HGB 10.6*  --   --  10.5* 10.1*  HCT 33.7*  --   --  33.6* 31.8*  PLT 285  --   --  310 319  HEPARINUNFRC  --  0.56 0.57  --  0.45  CREATININE 2.29*  --   --  2.70* 2.35*    Estimated Creatinine Clearance: 14.8 mL/min (by C-G formula based on Cr of 2.35).   Medications:  Infusions:  . heparin 1,450 Units/hr (10/01/14 1331)    Assessment: 89 yoF admitted on 7/18 for SBP and emesis.  PMH includes apixaban PTA for hx PAF, CAD s/p bilateral CEA, chronic diastolic CHF (EF 16-01%), and ascites.  Anticoagulation was held for paracentesis on 7/19. Pharmacy consulted to start IV heparin after paracentesis completed in case patient required further procedures.   Last dose apixaban 2.5mg  on 7/18 @ 0930.  Poor renal function with CrCl < 30 ml/min.  Significant events: 7/25 Heparin held in AM 4 hours prior to EGD with biopsies, resumed 2 hours following procedure 7/27 Pathology reveals gastric adenocarcinoma with malignant ascites.  Therapeutic paracentesis today  Today, 10/02/2014:  Heparin level = 0.45, remaining therapeutic with infusion at 1450 units/hr  CBC: Hgb 10.1 (stable), platelets  WNL  Renal: SCr remains elevated at 2.35, slightly improved from yesterday, CrCl ~ 15 ml/min  No issues per RN  Goal of Therapy:  Heparin level 0.3-0.7 units/ml Monitor platelets by anticoagulation protocol: Yes   Plan:   Continue heparin infusion at 1450 units/hr.  F/u timing of paracentesis and when to stop heparin  Daily heparin level and CBC  Follow up long-term anticoag plan for anticoagulation. Prognosis noted.  Note, Patients with SCr > 2.5 or CrCl < 25 ml/min were excluded from clinical trials.  In patients with severe or end-stage chronic kidney disease, warfarin remains the anticoagulant of choice.  Peggyann Juba, PharmD, BCPS Pager: [phone]  10/02/2014, 7:28 AM

## 2014-10-02 NOTE — Clinical Social Work Placement (Signed)
   CLINICAL SOCIAL WORK PLACEMENT  NOTE  Date:  10/02/2014  Patient Details  Name: [name] [name] MRN: [id number] Date of Birth: 04-19-1923  Clinical Social Work is seeking post-discharge placement for this patient at the [place] level of care (*CSW will initial, date and re-position this form in  chart as items are completed):  Yes   Patient/family provided with [place] Work Department's list of facilities offering this level of care within the geographic area requested by the patient (or if unable, by the patient's family).  Yes   Patient/family informed of their freedom to choose among providers that offer the needed level of care, that participate in Medicare, Medicaid or managed care program needed by the patient, have an available bed and are willing to accept the patient.  Yes   Patient/family informed of [place]'s ownership interest in [facility] and [facility], as well as of the fact that they are under no obligation to receive care at these facilities.  PASRR submitted to EDS on 09/26/14     PASRR number received on 09/26/14     Existing PASRR number confirmed on       FL2 transmitted to all facilities in geographic area requested by pt/family on 09/26/14     FL2 transmitted to all facilities within larger geographic area on       Patient informed that his/her managed care company has contracts with or will negotiate with certain facilities, including the following:        Yes   Patient/family informed of bed offers received.  Patient chooses bed at [facility]     Physician recommends and patient chooses bed at      Patient to be transferred to [facility] on 10/02/14.  Patient to be transferred to facility by ambulance Corey Harold)     Patient family notified on 10/02/14 of transfer.  Name of family member notified:  pt and pt daughter, [name] notified at bedside     PHYSICIAN Please sign FL2     Additional  Comment:    _______________________________________________ Ladell Pier, LCSW 10/02/2014, 1:42 PM

## 2014-10-02 NOTE — Discharge Instructions (Signed)
[facility] Stay Proper nutrition can help your body recover from illness and injury.   Foods and beverages high in protein, vitamins, and minerals help rebuild muscle loss, promote healing, & reduce fall risk.   In addition to eating healthy foods, a nutrition shake is an easy, delicious way to get the nutrition you need during and after your hospital stay  It is recommended that you continue to drink 2 bottles per day of:       Ensure Enlive for at least 1 month (30 days) after your hospital stay   Tips for adding a nutrition shake into your routine: As allowed, drink one with vitamins or medications instead of water or juice Enjoy one as a tasty mid-morning or afternoon snack Drink cold or make a milkshake out of it Drink one instead of milk with cereal or snacks Use as a coffee creamer   Available at the following grocery stores and pharmacies:           * [place] [phone]            For COUPONS visit: www.ensure.com/join or http://dawson-may.com/   Suggested Substitutions Ensure Plus = Boost Plus = Carnation Breakfast Essentials = Boost Compact Ensure Active Clear = Boost Breeze Glucerna Shake = Boost Glucose Control = Carnation Breakfast Essentials SUGAR FREE     Ascites Ascites is a gathering of fluid in the belly (abdomen). This is most often caused by liver disease. It may also be caused by a number of other less common problems. It causes a ballooning out (distension) of the abdomen. CAUSES  Scarring of the liver (cirrhosis) is the most common cause of ascites. Other causes include:  Infection or inflammation in the abdomen.  Cancer in the abdomen.  Heart failure.  Certain forms of kidney failure (nephritic syndrome).  Inflammation of the pancreas.  Clots in the veins of the  liver. SYMPTOMS  In the early stages of ascites, you may not have any symptoms. The main symptom of ascites is a sense of abdominal bloating. This is due to the presence of fluid. This may also cause an increase in abdominal or waist size. People with this condition can develop swelling in the legs, and men can develop a swollen scrotum. When there is a lot of fluid, it may be hard to breath. Stretching of the abdomen by fluid can be painful. DIAGNOSIS  Certain features of your medical history, such as a history of liver disease and of an enlarging abdomen, can suggest the presence of ascites. The diagnosis of ascites can be made on physical exam by your caregiver. An abdominal ultrasound examination can confirm that ascites is present, and estimate the amount of fluid. Once ascites is confirmed, it is important to determine its cause. Again, a history of one of the conditions listed in "CAUSES" provides a strong clue. A physical exam is important, and blood and X-ray tests may be needed. During a procedure called paracentesis, a sample of fluid is removed from the abdomen. This can determine certain key features about the fluid, such as whether or not infection or cancer is present. Your caregiver will determine if a paracentesis is necessary. They will describe  the procedure to you. PREVENTION  Ascites is a complication of other conditions. Therefore to prevent ascites, you must seek treatment for any significant health conditions you have. Once ascites is present, careful attention to fluid and salt intake may help prevent it from getting worse. If you have ascites, you should not drink alcohol. PROGNOSIS  The prognosis of ascites depends on the underlying disease. If the disease is reversible, such as with certain infections or with heart failure, then ascites may improve or disappear. When ascites is caused by cirrhosis, then it indicates that the liver disease has worsened, and further evaluation and  treatment of the liver disease is needed. If your ascites is caused by cancer, then the success or failure of the cancer treatment will determine whether your ascites will improve or worsen. RISKS AND COMPLICATIONS  Ascites is likely to worsen if it is not properly diagnosed and treated. A large amount of ascites can cause pain and difficulty breathing. The main complication, besides worsening, is infection (called spontaneous bacterial peritonitis). This requires prompt treatment. TREATMENT  The treatment of ascites depends on its cause. When liver disease is your cause, medical management using water pills (diuretics) and decreasing salt intake is often effective. Ascites due to peritoneal inflammation or malignancy (cancer) alone does not respond to salt restriction and diuretics. Hospitalization is sometimes required. If the treatment of ascites cannot be managed with medications, a number of other treatments are available. Your caregivers will help you decide which will work best for you. Some of these are:  Removal of fluid from the abdomen (paracentesis).  Fluid from the abdomen is passed into a vein (peritoneovenous shunting).  Liver transplantation.  Transjugular intrahepatic portosystemic stent shunt. HOME CARE INSTRUCTIONS  It is important to monitor body weight and the intake and output of fluids. Weigh yourself at the same time every day. Record your weights. Fluid restriction may be necessary. It is also important to know your salt intake. The more salt you take in, the more fluid you will retain. Ninety percent of people with ascites respond to this approach.  Follow any directions for medicines carefully.  Follow up with your caregiver, as directed.  Report any changes in your health, especially any new or worsening symptoms.  If your ascites is from liver disease, avoid alcohol and other substances toxic to the liver. SEEK MEDICAL CARE IF:   Your weight increases more than  a few pounds in a few days.  Your abdominal or waist size increases.  You develop swelling in your legs.  You had swelling and it worsens. SEEK IMMEDIATE MEDICAL CARE IF:   You develop a fever.  You develop new abdominal pain.  You develop difficulty breathing.  You develop confusion.  You have bleeding from the mouth, stomach, or rectum. MAKE SURE YOU:   Understand these instructions.  Will watch your condition.  Will get help right away if you are not doing well or get worse. Document Released: 02/21/2005 Document Revised: 05/16/2011 Document Reviewed: 09/22/2006 [facility] Patient Information 2015 [place], [place]. This information is not intended to replace advice given to you by your health care provider. Make sure you discuss any questions you have with your health care provider.

## 2014-10-02 NOTE — Progress Notes (Addendum)
Progress Note   [name] [name] NLZ:[id number] DOB: 1923/08/19 DOA: 09/22/2014 PCP: [name]   Brief Narrative:   [name] [name] is an 81 y.o. female with a PMH of CAD, HTN, HLD, COPD, carotid artery disease s/p bilateral CEA, atrial fibrillation and chronic diastolic CHF, ascites (unknown cause), who was admitted 09/22/14 with increased abdominal distension, now status post paracentesis twice with concern for malignant ascites. On workup, the patient was found to have a gastric mass with biopsies confirming adenocarcinoma.  Assessment/Plan:   Principal Problem:   Malignant ascites secondary to gastric adenocarcinoma / possible SBP - Workup initiated after a recent CT showed evidence of omental thickening. - Initial paracentesis with cytology showed immunohistochemical evidence of possible malignant cells. - Underwent EGD 09/29/14 with biopsies of gastric mass. Pathology confirmed adenocarcinoma. - Oncology following with recommendations for hospice care with life expectancy less than 6 months. No role for chemotherapy. - Palliative care consulted on patient 10/01/14. - Has been treated with empiric antibiotics for possible SBP, ascitic fluid cultures negative. - IR consultation for tunneled catheter given need for frequent paracentesis.  Risks/benefits discussed.  Active Problems:   COPD - Continue bronchodilation, Symbicort and Singulair.    Gout - Continue allopurinol.    Hyperlipidemia - Continue Lipitor.    Atrial fibrillation - Continue amiodarone, Zebeta, Cardizem and heparin.    Chronic combined systolic and diastolic heart failure / moderate aortic stenosis - Lasix currently on hold. - 2-D echo done 09/23/14 shows EF 30-35%, with worsening LV function and aortic stenosis compared to echo done 09/26/13. - Currently compensated off Lasix.    Hypertension - Continue Zebeta, Cardizem.     Acute kidney injury/CKD (chronic kidney disease), stage III - Renal  ultrasound negative for hydronephrosis.    Urinary retention - Discontinue Foley and monitor, check bladder scan every 4 hours.    Hyperkalemia - Given 30 g of Kayexalate yesterday, K+ 5.2 today (better).  Will give another 15 g.    DVT Prophylaxis - Lovenox ordered.  Family Communication: Daughter [name], updated at bedside. Disposition Plan: SNF likely, once tunneled catheter placed. Code Status: DNR  IV Access:    Peripheral IV   Procedures and diagnostic studies:   Ct Abdomen Pelvis Wo Contrast  09/12/2014   CLINICAL DATA:  81 year old female with abdominal pain and distention.  EXAM: CT ABDOMEN AND PELVIS WITHOUT CONTRAST  TECHNIQUE: Multidetector CT imaging of the abdomen and pelvis was performed following the standard protocol without IV contrast.  COMPARISON:  11/03/2011 CT  FINDINGS: Please note that parenchymal abnormalities may be missed without intravenous contrast.  Lower chest: Cardiomegaly, tiny bilateral pleural effusions and bibasilar atelectasis identified.  Hepatobiliary: The liver and gallbladder are unremarkable. There is no evidence of biliary dilatation.  Pancreas: Unremarkable  Spleen: Unremarkable  Adrenals/Urinary Tract: Bilateral renal atrophy identified. The adrenal glands and bladder are unremarkable.  Stomach/Bowel: There is possible circumferential wall thickening of the distal stomach. Colonic diverticulosis is identified without evidence of diverticulitis. No other focal bowel wall thickening is identified.  Vascular/Lymphatic: Abdominal aortic atherosclerotic calcifications are noted. There is no evidence of abdominal aortic aneurysm. No enlarged lymph nodes are noted.  Reproductive: The uterus and adnexal regions are unremarkable.  Other: A large amount of ascites is noted. Stranding in the omentum is present. No gross peritoneal nodules or masses are identified. There is no evidence of pneumoperitoneum.  Musculoskeletal: Surgical hardware within the proximal  left femur is noted. Moderate -severe degenerative changes in the lumbar  spine are identified. Grade 1 anterolisthesis of L4 on L5 is unchanged.  IMPRESSION: Large amount of ascites without identifiable cause. Omental stranding may represent edema but tumor involvement of the omentum is not entirely excluded. No adnexal masses identified.  Possible distal stomach wall thickening. Consider direct inspection as clinically indicated.  Bilateral renal atrophy.  Cardiomegaly, tiny bilateral pleural effusions and mild bibasilar atelectasis.   Electronically Signed   By: Margarette Canada M.D.   On: 09/12/2014 18:34   US Renal  10/02/2014   CLINICAL DATA:  Acute renal insufficiency  EXAM: RENAL / URINARY TRACT ULTRASOUND COMPLETE  COMPARISON:  None.  FINDINGS: Right Kidney:  Length: 9.7 cm. Echogenicity within normal limits. No mass or hydronephrosis visualized.  Left Kidney:  Length: 9.7 cm. Echogenicity within normal limits. No mass or hydronephrosis visualized.  Bladder:  Appears normal for degree of bladder distention.  Moderate volume ascites noted.  IMPRESSION: Negative for hydronephrosis.  Ascites noted.   Electronically Signed   By: Andreas Newport M.D.   On: 10/02/2014 03:12   US Paracentesis  10/01/2014   INDICATION: Ascites, request for paracentesis.  EXAM: ULTRASOUND-GUIDED PARACENTESIS  COMPARISON:  Paracentesis 09/23/14.  MEDICATIONS: None.  COMPLICATIONS: None immediate  TECHNIQUE: Informed written consent was obtained from the patient after a discussion of the risks, benefits and alternatives to treatment. A timeout was performed prior to the initiation of the procedure.  Initial ultrasound scanning demonstrates a large amount of ascites within the right lower abdominal quadrant. The right lower abdomen was prepped and draped in the usual sterile fashion. 1% lidocaine was used for local anesthesia.  Under direct ultrasound guidance, a 19 gauge, 7-cm, Yueh catheter was introduced. An ultrasound image was  saved for documentation purposed. The paracentesis was performed. The catheter was removed and a dressing was applied. The patient tolerated the procedure well without immediate post procedural complication.  FINDINGS: A total of approximately 4 liters of serous fluid was removed.  IMPRESSION: Successful ultrasound-guided paracentesis yielding 4 liters of peritoneal fluid.  Read By:  Tsosie Billing PA-C   Electronically Signed   By: Aletta Edouard M.D.   On: 10/01/2014 13:38   US Paracentesis  09/23/2014   INDICATION: Recurrent ascites, request for diagnostic and therapeutic paracentesis.  EXAM: ULTRASOUND-GUIDED PARACENTESIS  COMPARISON:  Paracentesis 09/18/14.  MEDICATIONS: None.  COMPLICATIONS: None immediate  TECHNIQUE: Informed written consent was obtained from the patient after a discussion of the risks, benefits and alternatives to treatment. A timeout was performed prior to the initiation of the procedure.  Initial ultrasound scanning demonstrates a moderate amount of ascites within the right lower abdominal quadrant. The right lower abdomen was prepped and draped in the usual sterile fashion. 1% lidocaine was used for local anesthesia.  Under direct ultrasound guidance, a 19 gauge, 10-cm, Yueh catheter was introduced. An ultrasound image was saved for documentation purposed. The paracentesis was performed. The catheter was removed and a dressing was applied. The patient tolerated the procedure well without immediate post procedural complication.  FINDINGS: A total of approximately 3.1 liters of serous fluid was removed. Samples were sent to the laboratory as requested by the clinical team.  IMPRESSION: Successful ultrasound-guided paracentesis yielding 3.1 liters of peritoneal fluid.  Read By:  Tsosie Billing PA-C   Electronically Signed   By: Corrie Mckusick D.O.   On: 09/23/2014 15:33   US Paracentesis  09/18/2014   INDICATION: Intra-abdominal ascites of uncertain etiology. Please perform  ultrasound-guided paracentesis for diagnostic and therapeutic purposes.  EXAM: ULTRASOUND-GUIDED PARACENTESIS  COMPARISON:  None.  MEDICATIONS: 10 cc 1% lidocaine  COMPLICATIONS: None immediate  TECHNIQUE: Informed written consent was obtained from the patient after a discussion of the risks, benefits and alternatives to treatment. A timeout was performed prior to the initiation of the procedure.  Initial ultrasound scanning demonstrates a large amount of ascites within the right lower abdominal quadrant. The right lower abdomen was prepped and draped in the usual sterile fashion. 1% lidocaine with epinephrine was used for local anesthesia. Under direct ultrasound guidance, a 19 gauge, 7-cm, Yueh catheter was introduced. An ultrasound image was saved for documentation purposed. The paracentesis was performed. The catheter was removed and a dressing was applied. The patient tolerated the procedure well without immediate post procedural complication.  FINDINGS: A total of approximately 3.8 liters of yellow fluid was removed. Samples were sent to the laboratory as requested by the clinical team.  IMPRESSION: Successful ultrasound-guided paracentesis yielding 3.8 liters of peritoneal fluid.  50 gr IV albumin during procedure per MD.  Read by:  Lavonia Drafts [facility]   Electronically Signed   By: Sandi Mariscal M.D.   On: 09/18/2014 15:46   Dg Chest Port 1 View  09/23/2014   CLINICAL DATA:  Shortness of breath.  Chest congestion.  EXAM: PORTABLE CHEST - 1 VIEW  COMPARISON:  Chest x-rays dated 06/21/2014, 02/26/2014, 07/24/2013 and 07/06/2013 and 04/12/2012  FINDINGS: There is cardiomegaly with new pulmonary vascular congestion and very slight accentuation of the interstitial markings. No discrete effusions. No acute osseous abnormality.  IMPRESSION: Pulmonary vascular congestion with minimal interstitial edema.   Electronically Signed   By: Lorriane Shire M.D.   On: 09/23/2014 14:30   Dg Abd Portable 1v  09/23/2014    CLINICAL DATA:  Abdominal distention and pain.  Ascites.  EXAM: PORTABLE ABDOMEN - 1 VIEW  COMPARISON:  CT scan dated 09/12/2014  FINDINGS: Bowel gas pattern is normal. Residual contrast in the appendix from the recent CT scan. Diffuse degenerative changes and scoliosis in the lumbar spine. Chronic degenerative or posttraumatic changes of the right pubic body. No acute osseous abnormality. Healed left hip fracture.  IMPRESSION: No acute abnormality of the abdomen.   Electronically Signed   By: Lorriane Shire M.D.   On: 09/23/2014 14:43     Medical Consultants:    Gastroenterology  Cardiology  Oncology  Surgery  Anti-Infectives:    Vancomycin 09/24/2014> 09/25/14  Ceftazidime 09/24/2014> 09/27/2014  Levaquin 09/28/2014>  Subjective:   Posey Pronto Crock reports beginning to feel that her abdomen is re-accumulating fluid.  No nausea or vomiting.  Has had some difficulty urinating, but has not required I&O cath.  Occasional abdominal pain.  Objective:    Filed Vitals:   10/01/14 2035 10/02/14 0521 10/02/14 0723 10/02/14 0807  BP: 77/43 95/56    Pulse: 90 106    Temp: 97.3 F (36.3 C) 97.3 F (36.3 C)    TempSrc: Oral Oral    Resp: 16 16    Height:      Weight:   70.308 kg (155 lb)   SpO2: 100% 100%  98%    Intake/Output Summary (Last 24 hours) at 10/02/14 0935 Last data filed at 10/02/14 2500  Gross per 24 hour  Intake    250 ml  Output    800 ml  Net   -550 ml    Exam: Gen:  NAD Cardiovascular:  HSIR Respiratory:  Lungs CTAB Gastrointestinal:  Abdomen softly distended Extremities:  2+ edema  Data Reviewed:    Labs: Basic Metabolic Panel:  Recent Labs Lab 09/28/14 0518 09/29/14 0204 09/30/14 0438 10/01/14 0435 10/02/14 0550  NA 135 133* 134* 133* 135  K 4.5 4.7 5.2* 5.9* 5.2*  CL 101 102 100* 99* 101  CO2 26 25 24 24 28   GLUCOSE 87 85 78 79 115*  BUN 43* 45* 48* 57* 60*  CREATININE 2.11* 1.95* 2.29* 2.70* 2.35*  CALCIUM 8.9 8.7* 9.4 9.7 9.9    GFR Estimated Creatinine Clearance: 14.8 mL/min (by C-G formula based on Cr of 2.35). Liver Function Tests:  Recent Labs Lab 09/29/14 0204 09/30/14 0438  AST 21 24  ALT 15 19  ALKPHOS 51 50  BILITOT 0.3 0.6  PROT 5.1* 5.3*  ALBUMIN 1.8* 1.7*   CBC:  Recent Labs Lab 09/28/14 0518 09/29/14 0204 09/30/14 0438 10/01/14 0435 10/02/14 0550  WBC 9.5 9.3 9.7 10.4 9.9  HGB 10.0* 10.5* 10.6* 10.5* 10.1*  HCT 32.5* 32.8* 33.7* 33.6* 31.8*  MCV 88.6 87.7 89.6 90.1 89.1  PLT 265 256 285 310 319   CBG:  Recent Labs Lab 09/29/14 0735 09/30/14 0730 10/01/14 0737 10/01/14 0816 10/01/14 0957  GLUCAP 86 67 66 68 74   Microbiology Recent Results (from the past 240 hour(s))  AFB culture with smear     Status: None (Preliminary result)   Collection Time: 09/23/14 10:39 AM  Result Value Ref Range Status   Specimen Description ABDOMEN  Final   Special Requests NONE  Final   Acid Fast Smear   Final    NO ACID FAST BACILLI SEEN Performed at [facility]    Culture   Final    CULTURE WILL BE EXAMINED FOR 6 WEEKS BEFORE ISSUING A FINAL REPORT Performed at [facility]    Report Status PENDING  Incomplete  Culture, body fluid-bottle     Status: None   Collection Time: 09/23/14 10:39 AM  Result Value Ref Range Status   Specimen Description FLUID ABDOMEN PERITONEAL  Final   Special Requests NONE  Final   Culture NO GROWTH 5 DAYS  Final   Report Status 09/28/2014 FINAL  Final  Gram stain     Status: None   Collection Time: 09/23/14 10:39 AM  Result Value Ref Range Status   Specimen Description FLUID ABDOMEN PERITONEAL  Final   Special Requests NONE  Final   Gram Stain   Final    ABUNDANT WBC PRESENT,BOTH PMN AND MONONUCLEAR NO ORGANISMS SEEN    Report Status 09/23/2014 FINAL  Final  Clostridium Difficile by PCR (not at [facility])     Status: None   Collection Time: 09/26/14 10:26 PM  Result Value Ref Range Status   C difficile by pcr NEGATIVE NEGATIVE Final   Culture, Urine     Status: None   Collection Time: 09/28/14 11:36 PM  Result Value Ref Range Status   Specimen Description URINE, CLEAN CATCH  Final   Special Requests NONE  Final   Culture   Final    NO GROWTH 1 DAY Performed at [facility]    Report Status 09/30/2014 FINAL  Final     Medications:   . allopurinol  100 mg Oral q morning - 10a  . bisoprolol  2.5 mg Oral Daily  . budesonide-formoterol  2 puff Inhalation BID  . cholecalciferol  1,000 Units Oral QHS  . cholecalciferol  2,000 Units Oral Daily  . clotrimazole  10 mg Oral 5 X Daily  . docusate sodium  100 mg Oral BID  . feeding supplement (ENSURE ENLIVE)  237 mL Oral BID BM  . folic acid  1 mg Oral Daily  . levofloxacin  250 mg Oral Q48H  . montelukast  10 mg Oral QHS  . multivitamin with minerals  1 tablet Oral Daily  . pantoprazole  80 mg Oral QPM  . saccharomyces boulardii  250 mg Oral BID  . sodium chloride  3 mL Intravenous Q12H  . thiamine  100 mg Oral Daily   Continuous Infusions: . heparin 1,450 Units/hr (10/01/14 1331)    Time spent: 35 minutes.  The patient is medically complex and requires high complexity decision making and coordination of care with multiple specialists.    LOS: 10 days   [place] [facility] Pager [phone]. If unable to reach me by pager, please call my cell phone at [phone].  *Please refer to [email or web address], password [email or web address] to get updated schedule on who will round on this patient, as hospitalists switch teams weekly. If 7PM-7AM, please contact night-coverage at [email or web address], password [email or web address] for any overnight needs.  10/02/2014, 9:35 AM

## 2014-10-02 NOTE — Progress Notes (Signed)
Pt for discharge to [facility].  CSW facilitated pt discharge needs including contacting facility, faxing pt discharge information via [facility], discussing with pt and pt daughter, [name] at bedside, providing RN phone number to call report, and arranging ambulance transport for pt to [facility].   Pt and pt daughter eager about transition to [facility] and eager to see how pt does in the coming weeks given pt malignant ascites secondary to gastric adenocarcinoma. CSW notified pt daughter that palliative care services can follow pt in SNF to assist with transition home with hospice or residential hospice when appropriate.  No further social work needs identified at this time.  CSW signing off.   Alison Murray, MSW, [place] [facility] [phone]

## 2014-10-03 ENCOUNTER — Non-Acute Institutional Stay (SKILLED_NURSING_FACILITY): Payer: Medicare Other | Admitting: Adult Health

## 2014-10-03 ENCOUNTER — Encounter: Payer: Self-pay | Admitting: Adult Health

## 2014-10-03 DIAGNOSIS — J449 Chronic obstructive pulmonary disease, unspecified: Secondary | ICD-10-CM | POA: Diagnosis not present

## 2014-10-03 DIAGNOSIS — I5042 Chronic combined systolic (congestive) and diastolic (congestive) heart failure: Secondary | ICD-10-CM

## 2014-10-03 DIAGNOSIS — E43 Unspecified severe protein-calorie malnutrition: Secondary | ICD-10-CM

## 2014-10-03 DIAGNOSIS — I10 Essential (primary) hypertension: Secondary | ICD-10-CM

## 2014-10-03 DIAGNOSIS — I481 Persistent atrial fibrillation: Secondary | ICD-10-CM

## 2014-10-03 DIAGNOSIS — E785 Hyperlipidemia, unspecified: Secondary | ICD-10-CM | POA: Diagnosis not present

## 2014-10-03 DIAGNOSIS — R5381 Other malaise: Secondary | ICD-10-CM | POA: Diagnosis not present

## 2014-10-03 DIAGNOSIS — E875 Hyperkalemia: Secondary | ICD-10-CM

## 2014-10-03 DIAGNOSIS — I4819 Other persistent atrial fibrillation: Secondary | ICD-10-CM

## 2014-10-03 DIAGNOSIS — N183 Chronic kidney disease, stage 3 unspecified: Secondary | ICD-10-CM

## 2014-10-03 DIAGNOSIS — D62 Acute posthemorrhagic anemia: Secondary | ICD-10-CM

## 2014-10-03 DIAGNOSIS — R180 Malignant ascites: Secondary | ICD-10-CM

## 2014-10-03 DIAGNOSIS — M109 Gout, unspecified: Secondary | ICD-10-CM

## 2014-10-05 NOTE — Progress Notes (Signed)
Patient ID: Keltie Labell, female   DOB: 10-13-1923, 81 y.o.   MRN: [id number]    DATE:  10/03/14 MRN:  [id number]  BIRTHDAY: September 01, 1923  Facility:  [facility]ursing Home Location:  [place] Room Number: 203-2  LEVEL OF CARE:  SNF [phone])  Contact Information    Name Relation Home Work [place] Daughter [phone]  [phone]      Chief Complaint  Patient presents with  . Hospitalization Follow-up    Physical deconditioning, malignant ascites, COPD, gout, hyperlipidemia, atrial fibrillation, CHF, hypertension, chronic kidney disease, hyperkalemia, anemia and protein calorie malnutrition    HISTORY OF PRESENT ILLNESS:  This is an 81 year old female who has been admitted to [facility] on 10/02/14 from [facility]. She has PMH of CAD, hypertension, hyperlipidemia, COPD, CAD S/P bilateral CEA, A. Fib, chronic diastolic CHF and ascites (unknown cause). She was having increased abdominal distention. She was recently noted to have ascites for which she had paracentesis at [facility]. Cytology showed immunochemical evidence of possible malignant cells. EGD, done on 09/29/14, with biopsy of gastric mass pathology confirmed adenocarcinoma. Oncology recommended hospice care with life expectancy less than 6 months. Palliative care was also consulted. No role for chemotherapy. IR consultation for tunneled catheter given need for frequent paracentesis. It will be scheduled in 1 week.  She has been admitted for a short-term rehabilitation.  PAST MEDICAL HISTORY:  Past Medical History  Diagnosis Date  . Hypertension   . Hyperlipidemia   . COPD (chronic obstructive pulmonary disease)   . Osteoporosis   . H/O hiatal hernia   . Chronic back pain   . CAD (coronary artery disease)     LHC 9/05:  pLAD less than 20%, D1 20-30%, ostial RCA 40-50%, proximal RCA 20%, EF 60%.  . C. difficile diarrhea   . Gout   . Atrial fibrillation     a. failed  DCCV => b. amiodarone added => converted to NSR on Amiodarone;  c. Xarelto d/c'd 2/2 falling (wrist and hip Fx in 1/14)  . Carotid stenosis     s/p bilat CEA  . C. difficile colitis     10/2011;  11/2011  . Salmonella enteritis 08/2011    c/b septic shock  . Hx of echocardiogram     a. Echocardiogram 09/29/11: Mild LVH, EF 45-50%, diffuse HK, mild to moderate aortic stenosis, mean gradient 12 mmHg, moderate MAC, mild MR, moderate LAE, moderate RAE, question small secundum ASD with left to right flow not seen in 4 chamber view, PASP 35-39 (mild pulmonary hypertension). ;  b.  TEE on 10/04/11: Mild LVH, EF 55%, mild MR, no defect or PFO  . Pneumonia 11/2101  . PONV (postoperative nausea and vomiting)   . CHF (congestive heart failure)   . Chronic kidney disease   . GERD (gastroesophageal reflux disease)   . Hepatitis   . Anemia   . Hx of echocardiogram     Echo (09/2013): EF 55%, normal wall motion, mild aortic stenosis (mean 12 mm Hg), MAC, mild MR, mild LAE, mild PI, PASP 30 mm Hg  . Hx of cardiovascular stress test     Lexiscan Myoview (7/15):  No ischemia or infarct, EF 50%, Low Risk  . Arthritis     Neck  . PAF (paroxysmal atrial fibrillation)      CURRENT MEDICATIONS: Reviewed  Patient's Medications  New Prescriptions   No medications on file  Previous Medications  ALLOPURINOL (ZYLOPRIM) 100 MG TABLET    Take 100 mg by mouth every morning.    AMIODARONE (PACERONE) 200 MG TABLET    Take 1 tablet (200 mg total) by mouth daily.   ATORVASTATIN (LIPITOR) 40 MG TABLET    Take 40 mg by mouth at bedtime.   BUDESONIDE-FORMOTEROL (SYMBICORT) 160-4.5 MCG/ACT INHALER    Inhale 2 puffs into the lungs 2 (two) times daily.   CHOLECALCIFEROL (VITAMIN D) 1000 UNITS TABLET    Take 1,000-2,000 Units by mouth 2 (two) times daily. Take 2000 units in the morning and 1000 units in the evening   CLOTRIMAZOLE (MYCELEX) 10 MG TROCHE    Take 1 tablet (10 mg total) by mouth 5 (five) times daily.   DILTIAZEM  (TIAZAC) 240 MG 24 HR CAPSULE    Take 240 mg by mouth daily.   DOCUSATE CALCIUM (STOOL SOFTENER PO)    Take 1 tablet by mouth as needed (constipation).   FEEDING SUPPLEMENT, ENSURE ENLIVE, (ENSURE ENLIVE) LIQD    Take 237 mLs by mouth 2 (two) times daily between meals.   FERROUS SULFATE 325 (65 FE) MG TABLET    Take 325 mg by mouth daily.    FOLIC ACID (FOLVITE) 1 MG TABLET    Take 1 tablet (1 mg total) by mouth daily.   FUROSEMIDE (LASIX) 20 MG TABLET    Take 1 tablet (20 mg total) by mouth daily.   LEVALBUTEROL (XOPENEX HFA) 45 MCG/ACT INHALER    Inhale 2 puffs into the lungs every 6 (six) hours as needed for wheezing.    LEVALBUTEROL (XOPENEX) 0.63 MG/3ML NEBULIZER SOLUTION    Take 0.63 mg by nebulization every 4 (four) hours as needed for wheezing or shortness of breath.   MONTELUKAST (SINGULAIR) 10 MG TABLET    Take 10 mg by mouth at bedtime.   MULTIPLE VITAMIN (MULTIVITAMIN WITH MINERALS) TABS TABLET    Take 1 tablet by mouth daily.   OMEPRAZOLE (PRILOSEC) 40 MG CAPSULE    Take 40 mg by mouth every evening.   ONDANSETRON (ZOFRAN) 4 MG TABLET    Take 1 tablet (4 mg total) by mouth every 6 (six) hours as needed for nausea.   POLYETHYLENE GLYCOL (MIRALAX / GLYCOLAX) PACKET    Take 17 g by mouth daily as needed for mild constipation.   RALOXIFENE (EVISTA) 60 MG TABLET    Take 60 mg by mouth every morning.    SACCHAROMYCES BOULARDII (FLORASTOR PO)    Take 1 tablet by mouth 2 (two) times daily.   THIAMINE 100 MG TABLET    Take 1 tablet (100 mg total) by mouth daily.   TRAMADOL (ULTRAM) 50 MG TABLET    Take 2 tablets (100 mg total) by mouth every 6 (six) hours as needed for moderate pain.  Modified Medications   No medications on file  Discontinued Medications   No medications on file     Allergies  Allergen Reactions  . Baby Powder [Methylbenzethonium] Shortness Of Breath  . Demerol [Meperidine] Hives  . Fexofenadine Other (See Comments)    dizziness  . Penicillins Hives    Tolerates  Ceftriaxone.  Marland Kitchen Spiriva Handihaler [Tiotropium Bromide Monohydrate]     Caused chest pains    REVIEW OF SYSTEMS:  GENERAL: no change in appetite, no fatigue, no weight changes, no fever, chills or weakness SKIN: Denies rash, itching, wounds, ulcer sores, or nail abnormality EYES: Denies change in vision, dry eyes, eye pain, itching or discharge EARS: Denies change in  hearing, ringing in ears, or earache NOSE: Denies nasal congestion or epistaxis MOUTH and THROAT: Denies oral discomfort, gingival pain or bleeding, pain from teeth or hoarseness   RESPIRATORY: no cough, SOB, DOE, wheezing, hemoptysis CARDIAC: no chest pain, edema or palpitations GI: + abdominal distention GU: Denies dysuria, frequency, hematuria, incontinence, or discharge PSYCHIATRIC: Denies feeling of depression or anxiety. No report of hallucinations, insomnia, paranoia, or agitation  PHYSICAL EXAMINATION  GENERAL APPEARANCE: Well nourished. In no acute distress. Normal body habitus SKIN:  Skin is warm and dry. There are no suspicious lesions or rash HEAD: Normal in size and contour. No evidence of trauma EYES: Lids open and close normally. No blepharitis, entropion or ectropion. PERRL. Conjunctivae are clear and sclerae are white. Lenses are without opacity EARS: Pinnae are normal. Patient hears normal voice tunes of the examiner MOUTH and THROAT: Lips are without lesions. Oral mucosa is moist and without lesions. Tongue is normal in shape, size, and color and without lesions NECK: supple, trachea midline, no neck masses, no thyroid tenderness, no thyromegaly LYMPHATICS: no LAN in the neck, no supraclavicular LAN RESPIRATORY: breathing is even & unlabored, BS CTAB CARDIAC: RRR, no murmur,no extra heart sounds, no edema GI: abdomen distended EXTREMITIES:  Able to move 4 extremities PSYCHIATRIC: Alert and oriented X 3. Affect and behavior are appropriate  LABS/RADIOLOGY: Labs reviewed: Basic Metabolic  Panel:  Recent Labs  02/27/14 0315  06/22/14 0702  09/23/14 0441  09/30/14 0438 10/01/14 0435 10/02/14 0550  NA 142  < > 139  < > 138  < > 134* 133* 135  K 4.3  < > 4.8  < > 3.5  < > 5.2* 5.9* 5.2*  CL 107  < > 102  < > 101  < > 100* 99* 101  CO2 29  < > 31  < > 28  < > 24 24 28   GLUCOSE 97  < > 92  < > 99  < > 78 79 115*  BUN 19  < > 24*  < > 30*  < > 48* 57* 60*  CREATININE 1.38*  < > 1.43*  < > 1.93*  < > 2.29* 2.70* 2.35*  CALCIUM 8.8  < > 8.4  < > 8.1*  < > 9.4 9.7 9.9  MG 2.2  --  2.3  --  2.0  --   --   --   --   PHOS  --   --  4.3  --  3.6  --   --   --   --   < > = values in this interval not displayed. Liver Function Tests:  Recent Labs  09/23/14 0441 09/29/14 0204 09/30/14 0438  AST 20 21 24   ALT 14 15 19   ALKPHOS 70 51 50  BILITOT 0.4 0.3 0.6  PROT 5.4* 5.1* 5.3*  ALBUMIN 2.3* 1.8* 1.7*    Recent Labs  09/12/14 1448 09/22/14 1654  LIPASE 29 29   CBC:  Recent Labs  06/21/14 1440  09/12/14 1448 09/22/14 1654  09/30/14 0438 10/01/14 0435 10/02/14 0550  WBC 7.0  < > 7.9 9.9  < > 9.7 10.4 9.9  NEUTROABS 5.6  --  6.6 8.6*  --   --   --   --   HGB 7.8*  < > 11.0* 11.2*  < > 10.6* 10.5* 10.1*  HCT 26.8*  < > 35.1* 34.7*  < > 33.7* 33.6* 31.8*  MCV 101.1*  < > 91.4 89.2  < >  89.6 90.1 89.1  PLT 267  < > 238 274  < > 285 310 319  < > = values in this interval not displayed.  Lipid Panel:  Recent Labs  09/22/14 2115  HDL 53   Cardiac Enzymes:  Recent Labs  06/22/14 1456 06/22/14 2105 09/12/14 1448  TROPONINI <0.03 <0.03 0.03   CBG:  Recent Labs  10/01/14 0737 10/01/14 0816 10/01/14 0957  GLUCAP 66 68 74    Ct Abdomen Pelvis Wo Contrast  09/12/2014   CLINICAL DATA:  81 year old female with abdominal pain and distention.  EXAM: CT ABDOMEN AND PELVIS WITHOUT CONTRAST  TECHNIQUE: Multidetector CT imaging of the abdomen and pelvis was performed following the standard protocol without IV contrast.  COMPARISON:  11/03/2011 CT  FINDINGS:  Please note that parenchymal abnormalities may be missed without intravenous contrast.  Lower chest: Cardiomegaly, tiny bilateral pleural effusions and bibasilar atelectasis identified.  Hepatobiliary: The liver and gallbladder are unremarkable. There is no evidence of biliary dilatation.  Pancreas: Unremarkable  Spleen: Unremarkable  Adrenals/Urinary Tract: Bilateral renal atrophy identified. The adrenal glands and bladder are unremarkable.  Stomach/Bowel: There is possible circumferential wall thickening of the distal stomach. Colonic diverticulosis is identified without evidence of diverticulitis. No other focal bowel wall thickening is identified.  Vascular/Lymphatic: Abdominal aortic atherosclerotic calcifications are noted. There is no evidence of abdominal aortic aneurysm. No enlarged lymph nodes are noted.  Reproductive: The uterus and adnexal regions are unremarkable.  Other: A large amount of ascites is noted. Stranding in the omentum is present. No gross peritoneal nodules or masses are identified. There is no evidence of pneumoperitoneum.  Musculoskeletal: Surgical hardware within the proximal left femur is noted. Moderate -severe degenerative changes in the lumbar spine are identified. Grade 1 anterolisthesis of L4 on L5 is unchanged.  IMPRESSION: Large amount of ascites without identifiable cause. Omental stranding may represent edema but tumor involvement of the omentum is not entirely excluded. No adnexal masses identified.  Possible distal stomach wall thickening. Consider direct inspection as clinically indicated.  Bilateral renal atrophy.  Cardiomegaly, tiny bilateral pleural effusions and mild bibasilar atelectasis.   Electronically Signed   By: Margarette Canada M.D.   On: 09/12/2014 18:34   US Renal  10/02/2014   CLINICAL DATA:  Acute renal insufficiency  EXAM: RENAL / URINARY TRACT ULTRASOUND COMPLETE  COMPARISON:  None.  FINDINGS: Right Kidney:  Length: 9.7 cm. Echogenicity within normal limits.  No mass or hydronephrosis visualized.  Left Kidney:  Length: 9.7 cm. Echogenicity within normal limits. No mass or hydronephrosis visualized.  Bladder:  Appears normal for degree of bladder distention.  Moderate volume ascites noted.  IMPRESSION: Negative for hydronephrosis.  Ascites noted.   Electronically Signed   By: Andreas Newport M.D.   On: 10/02/2014 03:12   US Paracentesis  10/01/2014   INDICATION: Ascites, request for paracentesis.  EXAM: ULTRASOUND-GUIDED PARACENTESIS  COMPARISON:  Paracentesis 09/23/14.  MEDICATIONS: None.  COMPLICATIONS: None immediate  TECHNIQUE: Informed written consent was obtained from the patient after a discussion of the risks, benefits and alternatives to treatment. A timeout was performed prior to the initiation of the procedure.  Initial ultrasound scanning demonstrates a large amount of ascites within the right lower abdominal quadrant. The right lower abdomen was prepped and draped in the usual sterile fashion. 1% lidocaine was used for local anesthesia.  Under direct ultrasound guidance, a 19 gauge, 7-cm, Yueh catheter was introduced. An ultrasound image was saved for documentation purposed. The paracentesis was performed.  The catheter was removed and a dressing was applied. The patient tolerated the procedure well without immediate post procedural complication.  FINDINGS: A total of approximately 4 liters of serous fluid was removed.  IMPRESSION: Successful ultrasound-guided paracentesis yielding 4 liters of peritoneal fluid.  Read By:  Tsosie Billing PA-C   Electronically Signed   By: Aletta Edouard M.D.   On: 10/01/2014 13:38   US Paracentesis  09/23/2014   INDICATION: Recurrent ascites, request for diagnostic and therapeutic paracentesis.  EXAM: ULTRASOUND-GUIDED PARACENTESIS  COMPARISON:  Paracentesis 09/18/14.  MEDICATIONS: None.  COMPLICATIONS: None immediate  TECHNIQUE: Informed written consent was obtained from the patient after a discussion of the risks,  benefits and alternatives to treatment. A timeout was performed prior to the initiation of the procedure.  Initial ultrasound scanning demonstrates a moderate amount of ascites within the right lower abdominal quadrant. The right lower abdomen was prepped and draped in the usual sterile fashion. 1% lidocaine was used for local anesthesia.  Under direct ultrasound guidance, a 19 gauge, 10-cm, Yueh catheter was introduced. An ultrasound image was saved for documentation purposed. The paracentesis was performed. The catheter was removed and a dressing was applied. The patient tolerated the procedure well without immediate post procedural complication.  FINDINGS: A total of approximately 3.1 liters of serous fluid was removed. Samples were sent to the laboratory as requested by the clinical team.  IMPRESSION: Successful ultrasound-guided paracentesis yielding 3.1 liters of peritoneal fluid.  Read By:  Tsosie Billing PA-C   Electronically Signed   By: Corrie Mckusick D.O.   On: 09/23/2014 15:33   US Paracentesis  09/18/2014   INDICATION: Intra-abdominal ascites of uncertain etiology. Please perform ultrasound-guided paracentesis for diagnostic and therapeutic purposes.  EXAM: ULTRASOUND-GUIDED PARACENTESIS  COMPARISON:  None.  MEDICATIONS: 10 cc 1% lidocaine  COMPLICATIONS: None immediate  TECHNIQUE: Informed written consent was obtained from the patient after a discussion of the risks, benefits and alternatives to treatment. A timeout was performed prior to the initiation of the procedure.  Initial ultrasound scanning demonstrates a large amount of ascites within the right lower abdominal quadrant. The right lower abdomen was prepped and draped in the usual sterile fashion. 1% lidocaine with epinephrine was used for local anesthesia. Under direct ultrasound guidance, a 19 gauge, 7-cm, Yueh catheter was introduced. An ultrasound image was saved for documentation purposed. The paracentesis was performed. The catheter was  removed and a dressing was applied. The patient tolerated the procedure well without immediate post procedural complication.  FINDINGS: A total of approximately 3.8 liters of yellow fluid was removed. Samples were sent to the laboratory as requested by the clinical team.  IMPRESSION: Successful ultrasound-guided paracentesis yielding 3.8 liters of peritoneal fluid.  50 gr IV albumin during procedure per MD.  Read by:  Lavonia Drafts [facility]   Electronically Signed   By: Sandi Mariscal M.D.   On: 09/18/2014 15:46   Dg Chest Port 1 View  09/23/2014   CLINICAL DATA:  Shortness of breath.  Chest congestion.  EXAM: PORTABLE CHEST - 1 VIEW  COMPARISON:  Chest x-rays dated 06/21/2014, 02/26/2014, 07/24/2013 and 07/06/2013 and 04/12/2012  FINDINGS: There is cardiomegaly with new pulmonary vascular congestion and very slight accentuation of the interstitial markings. No discrete effusions. No acute osseous abnormality.  IMPRESSION: Pulmonary vascular congestion with minimal interstitial edema.   Electronically Signed   By: Lorriane Shire M.D.   On: 09/23/2014 14:30   Dg Abd Portable 1v  09/23/2014   CLINICAL DATA:  Abdominal distention and pain.  Ascites.  EXAM: PORTABLE ABDOMEN - 1 VIEW  COMPARISON:  CT scan dated 09/12/2014  FINDINGS: Bowel gas pattern is normal. Residual contrast in the appendix from the recent CT scan. Diffuse degenerative changes and scoliosis in the lumbar spine. Chronic degenerative or posttraumatic changes of the right pubic body. No acute osseous abnormality. Healed left hip fracture.  IMPRESSION: No acute abnormality of the abdomen.   Electronically Signed   By: Lorriane Shire M.D.   On: 09/23/2014 14:43    ASSESSMENT/PLAN:  Physical deconditioning - for rehabilitation  Malignant ascites secondary to gastric adenocarcinoma - treated with empiric antibiotics for possible SBP, ascitic fluid cultures negative; IR consultation for tunneled catheter given need for frequent paracentesis, will be  scheduled in one week; continue tramadol 50 mg 2 tabs by mouth every 6 hours when necessary for pain  COPD - continue Symbicort 160-4.5 g/ACT 2 puffs 2 lungs twice a day, levalbuterol when necessary and singular 10 mg 1 tab by mouth daily at bedtime  Gout - continue allopurinol 100 mg by mouth every morning  Hyperlipidemia - continue atorvastatin 40 mg by mouth daily at bedtime  Atrial fibrillation - rate controlled; continue amiodarone 200 mg 1 tab by mouth daily and diltiazem 240 mg 24-hour 1 capsule by mouth daily; was taken off blood thinners and will start aspirin 81 mg daily 24 hours after peritoneal catheter placed; she does not tolerate betablockers  Chronic combined systolic and diastolic heart failure - EF 30-35%; continue Lasix 20 mg by mouth daily  Hypertension - continue diltiazem 240 mg 24-hour 1 capsule by mouth daily  Chronic kidney disease is stage III - creatinine 2.35; renal ultrasound is negative for hydronephrosis; check BMP  Hyperkalemia - K5.2; will monitor  Anemia, acute blood loss - hemoglobin 10.1; continue ferrous sulfate 325 mg 1 tab by mouth daily  Protein calorie malnutrition, severe - albumin 1.7; RD consultation    Goals of care:  Short-term rehabilitation    [facility], NP [facility] [phone]

## 2014-10-06 ENCOUNTER — Non-Acute Institutional Stay (SKILLED_NURSING_FACILITY): Payer: Medicare Other | Admitting: Internal Medicine

## 2014-10-06 DIAGNOSIS — M109 Gout, unspecified: Secondary | ICD-10-CM

## 2014-10-06 DIAGNOSIS — R112 Nausea with vomiting, unspecified: Secondary | ICD-10-CM | POA: Diagnosis not present

## 2014-10-06 DIAGNOSIS — I5042 Chronic combined systolic (congestive) and diastolic (congestive) heart failure: Secondary | ICD-10-CM | POA: Diagnosis not present

## 2014-10-06 DIAGNOSIS — I480 Paroxysmal atrial fibrillation: Secondary | ICD-10-CM

## 2014-10-06 DIAGNOSIS — R180 Malignant ascites: Secondary | ICD-10-CM | POA: Diagnosis not present

## 2014-10-06 DIAGNOSIS — C169 Malignant neoplasm of stomach, unspecified: Secondary | ICD-10-CM

## 2014-10-06 DIAGNOSIS — N183 Chronic kidney disease, stage 3 unspecified: Secondary | ICD-10-CM

## 2014-10-06 DIAGNOSIS — R5381 Other malaise: Secondary | ICD-10-CM

## 2014-10-06 DIAGNOSIS — E785 Hyperlipidemia, unspecified: Secondary | ICD-10-CM

## 2014-10-06 DIAGNOSIS — E46 Unspecified protein-calorie malnutrition: Secondary | ICD-10-CM

## 2014-10-06 DIAGNOSIS — D62 Acute posthemorrhagic anemia: Secondary | ICD-10-CM

## 2014-10-06 DIAGNOSIS — J449 Chronic obstructive pulmonary disease, unspecified: Secondary | ICD-10-CM

## 2014-10-06 DIAGNOSIS — K219 Gastro-esophageal reflux disease without esophagitis: Secondary | ICD-10-CM

## 2014-10-06 NOTE — Progress Notes (Signed)
Patient ID: [name] [name], female   DOB: June 20, 1923, 81 y.o.   MRN: [id number]     [facility]   PCP: Carlos Levering, PA-C  Code Status: DNR  Allergies  Allergen Reactions  . Baby Powder [Methylbenzethonium] Shortness Of Breath  . Demerol [Meperidine] Hives  . Fexofenadine Other (See Comments)    dizziness  . Penicillins Hives    Tolerates Ceftriaxone.  Marland Kitchen Spiriva Handihaler [Tiotropium Bromide Monohydrate]     Caused chest pains    Chief Complaint  Patient presents with  . New Admit To SNF     HPI:  81 y.o. patient is here for short term rehabilitation post hospital admission from 09/22/14-10/02/14 with newly diagnosed gastric adenocarcinoma and malignant ascites. Oncology was consulted and she has poor prognosis of less than 6 months. palliaitve care was consulted in hospital and recommended hospice. She was briefly on antibiotics with concern for SBP but culture resulted negative. IR was consulted for tunneled catheter for therapeutic paracentesis for comfort measure and has outpatient appointment on 10/09/14. She has PMH of CAD, HTN, HLD, COPD, carotid artery disease s/p bilateral CEA, AFib, chronic diastolic chf among others. She is seen in her room today. She has been nauseous and is vomiting. Her daughter is at the bedside. She complaints of right sided chest wall discomfort and muscle aches.   Review of Systems:  Constitutional: Negative for fever, chills, diaphoresis. positive for lethargy HENT: Negative for headache, congestion, nasal discharge Eyes: Negative for eye pain, blurred vision, double vision and discharge.  Respiratory: Negative for cough, shortness of breath and wheezing.   Cardiovascular: Negative for chest pain, palpitations. Positive for leg swelling.  Gastrointestinal: Negative for abdominal pain. Poor appetite. Had bowel movement yesterdya  Genitourinary: Negative for dysuria and flank pain.  Musculoskeletal: Negative for  falls in facility. positive for generalized weakness Skin: Negative for itching, rash.  Neurological: Negative for dizziness, tingling, focal weakness   Past Medical History  Diagnosis Date  . Hypertension   . Hyperlipidemia   . COPD (chronic obstructive pulmonary disease)   . Osteoporosis   . H/O hiatal hernia   . Chronic back pain   . CAD (coronary artery disease)     LHC 9/05:  pLAD less than 20%, D1 20-30%, ostial RCA 40-50%, proximal RCA 20%, EF 60%.  . C. difficile diarrhea   . Gout   . Atrial fibrillation     a. failed DCCV => b. amiodarone added => converted to NSR on Amiodarone;  c. Xarelto d/c'd 2/2 falling (wrist and hip Fx in 1/14)  . Carotid stenosis     s/p bilat CEA  . C. difficile colitis     10/2011;  11/2011  . Salmonella enteritis 08/2011    c/b septic shock  . Hx of echocardiogram     a. Echocardiogram 09/29/11: Mild LVH, EF 45-50%, diffuse HK, mild to moderate aortic stenosis, mean gradient 12 mmHg, moderate MAC, mild MR, moderate LAE, moderate RAE, question small secundum ASD with left to right flow not seen in 4 chamber view, PASP 35-39 (mild pulmonary hypertension). ;  b.  TEE on 10/04/11: Mild LVH, EF 55%, mild MR, no defect or PFO  . Pneumonia 11/2101  . PONV (postoperative nausea and vomiting)   . CHF (congestive heart failure)   . Chronic kidney disease   . GERD (gastroesophageal reflux disease)   . Hepatitis   . Anemia   . Hx of echocardiogram     Echo (09/2013): EF  55%, normal wall motion, mild aortic stenosis (mean 12 mm Hg), MAC, mild MR, mild LAE, mild PI, PASP 30 mm Hg  . Hx of cardiovascular stress test     Lexiscan Myoview (7/15):  No ischemia or infarct, EF 50%, Low Risk  . Arthritis     Neck  . PAF (paroxysmal atrial fibrillation)    Past Surgical History  Procedure Laterality Date  . Carotid endarterectomy      bilateral  . Cataract extraction, bilateral    . Joint replacement  2008    Right Total Knee  . Anal fistula repair    . Left  parotidectomy    . Eye surgery    . Flexible sigmoidoscopy  09/03/2011    Procedure: FLEXIBLE SIGMOIDOSCOPY;  Surgeon: Beryle Beams, MD;  Location: [facility];  Service: Endoscopy;  Laterality: N/A;  . Tee without cardioversion  10/04/2011    Procedure: TRANSESOPHAGEAL ECHOCARDIOGRAM (TEE);  Surgeon: Larey Dresser, MD;  Location: [facility];  Service: Cardiovascular;  Laterality: N/A;  to be carelinked here by 1230-verified 7/29/dl  . Cardioversion  10/04/2011    Procedure: CARDIOVERSION;  Surgeon: Larey Dresser, MD;  Location: [facility] END[facility]COPY;  Service: Cardiovascular;  Laterality: N/A;  . Esophagogastroduodenoscopy  11/29/2011    Procedure: ESOPHAGOGASTRODUODENOSCOPY (EGD);  Surgeon: Cleotis Nipper, MD;  Location: [facility];  Service: Endoscopy;  Laterality: N/A;  recent h/o resp failure/pneumonia  . Left hip fracture repair  03/14/12  . Open reduction internal fixation (orif) scaphoid with distal radius graft  03/14/2012    Procedure: OPEN REDUCTION INTERNAL FIXATION (ORIF) SCAPHOID WITH DISTAL RADIUS GRAFT;  Surgeon: Jolyn Nap, MD;  Location: [facility];  Service: Orthopedics;  Laterality: Left;  DVR wrist fracture set/ hand innovation  . Hip pinning,cannulated  03/14/2012    Procedure: CANNULATED HIP PINNING;  Surgeon: Jolyn Nap, MD;  Location: [facility];  Service: Orthopedics;  Laterality: Left;  Biomet 6.5 cannulated screws  . Femur im nail Left 04/13/2012    Procedure: INTRAMEDULLARY (IM) NAIL FEMORAL;  Surgeon: Jolyn Nap, MD;  Location: [place];  Service: Orthopedics;  Laterality: Left;  OPEN REDUCTION INTERNAL FIXATION LEFT PROXIMAL FEMUR Fracture   . Esophagogastroduodenoscopy Left 09/29/2014    Procedure: ESOPHAGOGASTRODUODENOSCOPY (EGD);  Surgeon: Laurence Spates, MD;  Location: [facility];  Service: Endoscopy;  Laterality: Left;   Social History:   reports that she quit smoking about 31 years ago. Her smoking use included Cigarettes. She has a 20 pack-year smoking  history. She has never used smokeless tobacco. She reports that she does not drink alcohol or use illicit drugs.  Family History  Problem Relation Age of Onset  . Hemochromatosis Sister   . Diabetes Sister   . Hypertension Sister   . Diabetes type II Mother   . Diabetes Mother   . Hypertension Mother   . Deep vein thrombosis Mother   . Hypertension Father   . Hypertension Brother   . Other Brother     Leg amputation-[place]  . Depression Brother   . Hemochromatosis Sister   . Heart attack Neg Hx   . Stroke Neg Hx   . Cancer Sister     Medications:   Medication List       This list is accurate as of: 10/06/14  2:01 PM.  Always use your most recent med list.               allopurinol 100 MG tablet  Commonly known as:  ZYLOPRIM  Take 100 mg by mouth every morning.     amiodarone 200 MG tablet  Commonly known as:  PACERONE  Take 1 tablet (200 mg total) by mouth daily.     atorvastatin 40 MG tablet  Commonly known as:  LIPITOR  Take 40 mg by mouth at bedtime.     budesonide-formoterol 160-4.5 MCG/ACT inhaler  Commonly known as:  SYMBICORT  Inhale 2 puffs into the lungs 2 (two) times daily.     cholecalciferol 1000 UNITS tablet  Commonly known as:  VITAMIN D  Take 1,000-2,000 Units by mouth 2 (two) times daily. Take 2000 units in the morning and 1000 units in the evening     clotrimazole 10 MG troche  Commonly known as:  MYCELEX  Take 1 tablet (10 mg total) by mouth 5 (five) times daily.     diltiazem 240 MG 24 hr capsule  Commonly known as:  TIAZAC  Take 240 mg by mouth daily.     feeding supplement (ENSURE ENLIVE) Liqd  Take 237 mLs by mouth 2 (two) times daily between meals.     ferrous sulfate 325 (65 FE) MG tablet  Take 325 mg by mouth daily.     FLORASTOR PO  Take 1 tablet by mouth 2 (two) times daily.     folic acid 1 MG tablet  Commonly known as:  FOLVITE  Take 1 tablet (1 mg total) by mouth daily.     furosemide 20 MG tablet  Commonly known  as:  LASIX  Take 1 tablet (20 mg total) by mouth daily.     levalbuterol 0.63 MG/3ML nebulizer solution  Commonly known as:  XOPENEX  Take 0.63 mg by nebulization every 4 (four) hours as needed for wheezing or shortness of breath.     levalbuterol 45 MCG/ACT inhaler  Commonly known as:  XOPENEX HFA  Inhale 2 puffs into the lungs every 6 (six) hours as needed for wheezing.     montelukast 10 MG tablet  Commonly known as:  SINGULAIR  Take 10 mg by mouth at bedtime.     multivitamin with minerals Tabs tablet  Take 1 tablet by mouth daily.     omeprazole 40 MG capsule  Commonly known as:  PRILOSEC  Take 40 mg by mouth every evening.     ondansetron 4 MG tablet  Commonly known as:  ZOFRAN  Take 1 tablet (4 mg total) by mouth every 6 (six) hours as needed for nausea.     polyethylene glycol packet  Commonly known as:  MIRALAX / GLYCOLAX  Take 17 g by mouth daily as needed for mild constipation.     raloxifene 60 MG tablet  Commonly known as:  EVISTA  Take 60 mg by mouth every morning.     STOOL SOFTENER PO  Take 1 tablet by mouth as needed (constipation).     thiamine 100 MG tablet  Take 1 tablet (100 mg total) by mouth daily.     traMADol 50 MG tablet  Commonly known as:  ULTRAM  Take 2 tablets (100 mg total) by mouth every 6 (six) hours as needed for moderate pain.         Physical Exam: Filed Vitals:   10/06/14 1358  BP: 111/56  Pulse: 81  Temp: 96.9 F (36.1 C)  Resp: 19  Weight: 157 lb 9.6 oz (71.487 kg)  SpO2: 97%    General- elderly female, frail and ill appearing, appears uncomfortable Head- normocephalic, atraumatic Nose- normal  nasal mucosa, no maxillary or frontal sinus tenderness, no nasal discharge Throat- moist mucus membrane Eyes- no pallor, no icterus, no discharge, normal conjunctiva, normal sclera Neck- no cervical lymphadenopathy, no jugular vein distension Chest- no chest wall deformities, no chest wall tenderness on  palpation Cardiovascular- normal s1,s2, no murmurs, palpable dorsalis pedis, 2+ leg edema Respiratory- bilateral poor air entry, on o2, no wheeze, no rhonchi, no crackles, no use of accessory muscles Abdomen- bowel sounds present, soft, distended with ascitic fluid Musculoskeletal- able to move all 4 extremities, generalized weakness Neurological- no focal deficit, alert and oriented to person, place and time Skin- warm and dry Psychiatry- normal mood and affect    Labs reviewed: Basic Metabolic Panel:  Recent Labs  02/27/14 0315  06/22/14 0702  09/23/14 0441  09/30/14 0438 10/01/14 0435 10/02/14 0550  NA 142  < > 139  < > 138  < > 134* 133* 135  K 4.3  < > 4.8  < > 3.5  < > 5.2* 5.9* 5.2*  CL 107  < > 102  < > 101  < > 100* 99* 101  CO2 29  < > 31  < > 28  < > 24 24 28   GLUCOSE 97  < > 92  < > 99  < > 78 79 115*  BUN 19  < > 24*  < > 30*  < > 48* 57* 60*  CREATININE 1.38*  < > 1.43*  < > 1.93*  < > 2.29* 2.70* 2.35*  CALCIUM 8.8  < > 8.4  < > 8.1*  < > 9.4 9.7 9.9  MG 2.2  --  2.3  --  2.0  --   --   --   --   PHOS  --   --  4.3  --  3.6  --   --   --   --   < > = values in this interval not displayed. Liver Function Tests:  Recent Labs  09/23/14 0441 09/29/14 0204 09/30/14 0438  AST 20 21 24   ALT 14 15 19   ALKPHOS 70 51 50  BILITOT 0.4 0.3 0.6  PROT 5.4* 5.1* 5.3*  ALBUMIN 2.3* 1.8* 1.7*    Recent Labs  09/12/14 1448 09/22/14 1654  LIPASE 29 29   No results for input(s): AMMONIA in the last 8760 hours. CBC:  Recent Labs  06/21/14 1440  09/12/14 1448 09/22/14 1654  09/30/14 0438 10/01/14 0435 10/02/14 0550  WBC 7.0  < > 7.9 9.9  < > 9.7 10.4 9.9  NEUTROABS 5.6  --  6.6 8.6*  --   --   --   --   HGB 7.8*  < > 11.0* 11.2*  < > 10.6* 10.5* 10.1*  HCT 26.8*  < > 35.1* 34.7*  < > 33.7* 33.6* 31.8*  MCV 101.1*  < > 91.4 89.2  < > 89.6 90.1 89.1  PLT 267  < > 238 274  < > 285 310 319  < > = values in this interval not displayed. Cardiac  Enzymes:  Recent Labs  06/22/14 1456 06/22/14 2105 09/12/14 1448  TROPONINI <0.03 <0.03 0.03   BNP: Invalid input(s): POCBNP CBG:  Recent Labs  10/01/14 0737 10/01/14 0816 10/01/14 0957  GLUCAP 66 68 74    Radiological Exams: US Paracentesis  09/23/2014   INDICATION: Recurrent ascites, request for diagnostic and therapeutic paracentesis.  EXAM: ULTRASOUND-GUIDED PARACENTESIS  COMPARISON:  Paracentesis 09/18/14.  MEDICATIONS: None.  COMPLICATIONS: None immediate  TECHNIQUE: Informed written consent was obtained from the patient after a discussion of the risks, benefits and alternatives to treatment. A timeout was performed prior to the initiation of the procedure.  Initial ultrasound scanning demonstrates a moderate amount of ascites within the right lower abdominal quadrant. The right lower abdomen was prepped and draped in the usual sterile fashion. 1% lidocaine was used for local anesthesia.  Under direct ultrasound guidance, a 19 gauge, 10-cm, Yueh catheter was introduced. An ultrasound image was saved for documentation purposed. The paracentesis was performed. The catheter was removed and a dressing was applied. The patient tolerated the procedure well without immediate post procedural complication.  FINDINGS: A total of approximately 3.1 liters of serous fluid was removed. Samples were sent to the laboratory as requested by the clinical team.  IMPRESSION: Successful ultrasound-guided paracentesis yielding 3.1 liters of peritoneal fluid.  Read By:  Tsosie Billing PA-C   Electronically Signed   By: Corrie Mckusick D.O.   On: 09/23/2014 15:33   Dg Chest Port 1 View  09/23/2014   CLINICAL DATA:  Shortness of breath.  Chest congestion.  EXAM: PORTABLE CHEST - 1 VIEW  COMPARISON:  Chest x-rays dated 06/21/2014, 02/26/2014, 07/24/2013 and 07/06/2013 and 04/12/2012  FINDINGS: There is cardiomegaly with new pulmonary vascular congestion and very slight accentuation of the interstitial markings. No  discrete effusions. No acute osseous abnormality.  IMPRESSION: Pulmonary vascular congestion with minimal interstitial edema.   Electronically Signed   By: Lorriane Shire M.D.   On: 09/23/2014 14:30   Dg Abd Portable 1v  09/23/2014   CLINICAL DATA:  Abdominal distention and pain.  Ascites.  EXAM: PORTABLE ABDOMEN - 1 VIEW  COMPARISON:  CT scan dated 09/12/2014  FINDINGS: Bowel gas pattern is normal. Residual contrast in the appendix from the recent CT scan. Diffuse degenerative changes and scoliosis in the lumbar spine. Chronic degenerative or posttraumatic changes of the right pubic body. No acute osseous abnormality. Healed left hip fracture.  IMPRESSION: No acute abnormality of the abdomen.   Electronically Signed   By: Lorriane Shire M.D.   On: 09/23/2014 14:43    Assessment/Plan   Physical deconditioning Will have patient work with PT/OT as tolerated to regain strength and restore function.  Fall precautions are in place.  Gastric adenocarcinoma With malignant ascites. Poor prognosis, goal of care is comfort care. Palliative care to see patient today  malignant ascites Pending IR appointment for tunneled catheter to help with therapeutic paracentesis for comfort. Have asked scheduler to see if this could be scheduled sonner than 10/09/14. Continue tramadol 100mg  q6h prn pain  Nausea and vomiting With fluid overload from ascites and compression from gastric adenocarcinoma. Give zofran 4 mg in x 1 now, NPO for now and advance diet with clear liquid and further upgrade as tolerated. Aspiration precautions- keep head end elevated. zofran 4 mg q6h prn im/po as tolerated for now  gerd Change protonix to 40 mg bid for now and reassess  Blood loss anemia Lab Results  Component Value Date   WBC 9.9 10/02/2014   HGB 10.1* 10/02/2014   HCT 31.8* 10/02/2014   MCV 89.1 10/02/2014   PLT 319 10/02/2014   Monitor h&h, continue feso4 325 mg daily for now  afib Rate controlled. Continue  amiodarone 200 mg daily with diltiazem 240 mg daily, start baby aspirin 24 hr post tunneled cath placement  COPD  Continue o2 with Symbicort bid and singulair daily with xopenex prn and monitor  ckd stage 3  Cr 2.35, monitor renal function  Protein calorie malnutrition Decline anticipated, monitor clinically and encourage po intale as tolerated, RD consult has been placed to assess for supplement  CHF EF 30-5%. With her worsening leg edema and this causing discomfot, increase lasix to 40 mg daily for now and reassess. Continue o2  Gout  No recent flare up. D/c allopurinol for now  Hyperlipidemia With her ongoing nausea and vomiting. D/c atorvastatin   Goals of care: short term rehabilitation   Labs/tests ordered: cbc with diff, cmp  Family/ staff Communication: reviewed care plan with patient, her daughter, charge nurse and spoke over the phone with palliative care and hospice team.    Blanchie Serve, MD  [place] [phone] (Monday-Friday 8 am - 5 pm) [phone] (afterhours)

## 2014-10-07 ENCOUNTER — Emergency Department (HOSPITAL_COMMUNITY): Payer: Medicare Other

## 2014-10-07 ENCOUNTER — Encounter (HOSPITAL_COMMUNITY): Payer: Self-pay

## 2014-10-07 ENCOUNTER — Inpatient Hospital Stay (HOSPITAL_COMMUNITY)
Admission: EM | Admit: 2014-10-07 | Discharge: 2014-10-10 | DRG: 375 | Disposition: A | Discharge status: Hospice | Payer: Medicare Other | Attending: Family Medicine | Admitting: Family Medicine

## 2014-10-07 DIAGNOSIS — R1011 Right upper quadrant pain: Secondary | ICD-10-CM | POA: Diagnosis present

## 2014-10-07 DIAGNOSIS — J449 Chronic obstructive pulmonary disease, unspecified: Secondary | ICD-10-CM | POA: Diagnosis present

## 2014-10-07 DIAGNOSIS — M810 Age-related osteoporosis without current pathological fracture: Secondary | ICD-10-CM | POA: Diagnosis present

## 2014-10-07 DIAGNOSIS — Z79891 Long term (current) use of opiate analgesic: Secondary | ICD-10-CM

## 2014-10-07 DIAGNOSIS — G893 Neoplasm related pain (acute) (chronic): Secondary | ICD-10-CM | POA: Diagnosis present

## 2014-10-07 DIAGNOSIS — I959 Hypotension, unspecified: Secondary | ICD-10-CM | POA: Diagnosis present

## 2014-10-07 DIAGNOSIS — I5042 Chronic combined systolic (congestive) and diastolic (congestive) heart failure: Secondary | ICD-10-CM | POA: Diagnosis present

## 2014-10-07 DIAGNOSIS — R64 Cachexia: Secondary | ICD-10-CM | POA: Diagnosis present

## 2014-10-07 DIAGNOSIS — R109 Unspecified abdominal pain: Secondary | ICD-10-CM | POA: Diagnosis present

## 2014-10-07 DIAGNOSIS — Z8249 Family history of ischemic heart disease and other diseases of the circulatory system: Secondary | ICD-10-CM

## 2014-10-07 DIAGNOSIS — I482 Chronic atrial fibrillation: Secondary | ICD-10-CM | POA: Diagnosis present

## 2014-10-07 DIAGNOSIS — R079 Chest pain, unspecified: Secondary | ICD-10-CM

## 2014-10-07 DIAGNOSIS — E785 Hyperlipidemia, unspecified: Secondary | ICD-10-CM | POA: Diagnosis present

## 2014-10-07 DIAGNOSIS — Z87891 Personal history of nicotine dependence: Secondary | ICD-10-CM | POA: Diagnosis not present

## 2014-10-07 DIAGNOSIS — G8929 Other chronic pain: Secondary | ICD-10-CM | POA: Diagnosis present

## 2014-10-07 DIAGNOSIS — R627 Adult failure to thrive: Secondary | ICD-10-CM | POA: Diagnosis present

## 2014-10-07 DIAGNOSIS — N179 Acute kidney failure, unspecified: Secondary | ICD-10-CM

## 2014-10-07 DIAGNOSIS — L8990 Pressure ulcer of unspecified site, unspecified stage: Secondary | ICD-10-CM | POA: Diagnosis present

## 2014-10-07 DIAGNOSIS — I480 Paroxysmal atrial fibrillation: Secondary | ICD-10-CM | POA: Diagnosis present

## 2014-10-07 DIAGNOSIS — K219 Gastro-esophageal reflux disease without esophagitis: Secondary | ICD-10-CM | POA: Diagnosis present

## 2014-10-07 DIAGNOSIS — Z79899 Other long term (current) drug therapy: Secondary | ICD-10-CM

## 2014-10-07 DIAGNOSIS — M549 Dorsalgia, unspecified: Secondary | ICD-10-CM | POA: Diagnosis present

## 2014-10-07 DIAGNOSIS — C189 Malignant neoplasm of colon, unspecified: Secondary | ICD-10-CM | POA: Diagnosis present

## 2014-10-07 DIAGNOSIS — R188 Other ascites: Secondary | ICD-10-CM

## 2014-10-07 DIAGNOSIS — I4891 Unspecified atrial fibrillation: Secondary | ICD-10-CM | POA: Diagnosis present

## 2014-10-07 DIAGNOSIS — I350 Nonrheumatic aortic (valve) stenosis: Secondary | ICD-10-CM | POA: Diagnosis present

## 2014-10-07 DIAGNOSIS — M1990 Unspecified osteoarthritis, unspecified site: Secondary | ICD-10-CM | POA: Diagnosis present

## 2014-10-07 DIAGNOSIS — Z880 Allergy status to penicillin: Secondary | ICD-10-CM

## 2014-10-07 DIAGNOSIS — E860 Dehydration: Secondary | ICD-10-CM | POA: Diagnosis present

## 2014-10-07 DIAGNOSIS — N189 Chronic kidney disease, unspecified: Secondary | ICD-10-CM | POA: Diagnosis present

## 2014-10-07 DIAGNOSIS — Z888 Allergy status to other drugs, medicaments and biological substances status: Secondary | ICD-10-CM | POA: Diagnosis not present

## 2014-10-07 DIAGNOSIS — R180 Malignant ascites: Secondary | ICD-10-CM

## 2014-10-07 DIAGNOSIS — I129 Hypertensive chronic kidney disease with stage 1 through stage 4 chronic kidney disease, or unspecified chronic kidney disease: Secondary | ICD-10-CM | POA: Diagnosis present

## 2014-10-07 DIAGNOSIS — I2510 Atherosclerotic heart disease of native coronary artery without angina pectoris: Secondary | ICD-10-CM | POA: Diagnosis present

## 2014-10-07 DIAGNOSIS — E875 Hyperkalemia: Secondary | ICD-10-CM | POA: Diagnosis present

## 2014-10-07 DIAGNOSIS — Z809 Family history of malignant neoplasm, unspecified: Secondary | ICD-10-CM | POA: Diagnosis not present

## 2014-10-07 DIAGNOSIS — R630 Anorexia: Secondary | ICD-10-CM | POA: Diagnosis present

## 2014-10-07 DIAGNOSIS — Z66 Do not resuscitate: Secondary | ICD-10-CM | POA: Diagnosis present

## 2014-10-07 DIAGNOSIS — Z885 Allergy status to narcotic agent status: Secondary | ICD-10-CM | POA: Diagnosis not present

## 2014-10-07 DIAGNOSIS — Z515 Encounter for palliative care: Secondary | ICD-10-CM | POA: Diagnosis not present

## 2014-10-07 DIAGNOSIS — Z833 Family history of diabetes mellitus: Secondary | ICD-10-CM | POA: Diagnosis not present

## 2014-10-07 DIAGNOSIS — M109 Gout, unspecified: Secondary | ICD-10-CM | POA: Diagnosis present

## 2014-10-07 DIAGNOSIS — R1010 Upper abdominal pain, unspecified: Secondary | ICD-10-CM | POA: Diagnosis present

## 2014-10-07 LAB — CBC WITH DIFFERENTIAL/PLATELET
Basophils Absolute: 0 10*3/uL (ref 0.0–0.1)
Basophils Relative: 0 % (ref 0–1)
EOS ABS: 0 10*3/uL (ref 0.0–0.7)
EOS PCT: 0 % (ref 0–5)
HCT: 31 % — ABNORMAL LOW (ref 36.0–46.0)
Hemoglobin: 9.9 g/dL — ABNORMAL LOW (ref 12.0–15.0)
LYMPHS ABS: 0.3 10*3/uL — AB (ref 0.7–4.0)
Lymphocytes Relative: 2 % — ABNORMAL LOW (ref 12–46)
MCH: 28.3 pg (ref 26.0–34.0)
MCHC: 31.9 g/dL (ref 30.0–36.0)
MCV: 88.6 fL (ref 78.0–100.0)
MONO ABS: 1.1 10*3/uL — AB (ref 0.1–1.0)
MONOS PCT: 7 % (ref 3–12)
Neutro Abs: 14.9 10*3/uL — ABNORMAL HIGH (ref 1.7–7.7)
Neutrophils Relative %: 91 % — ABNORMAL HIGH (ref 43–77)
PLATELETS: 333 10*3/uL (ref 150–400)
RBC: 3.5 MIL/uL — ABNORMAL LOW (ref 3.87–5.11)
RDW: 15.7 % — AB (ref 11.5–15.5)
WBC: 16.3 10*3/uL — AB (ref 4.0–10.5)

## 2014-10-07 LAB — COMPREHENSIVE METABOLIC PANEL
ALT: 34 U/L (ref 14–54)
AST: 43 U/L — ABNORMAL HIGH (ref 15–41)
Albumin: 1.7 g/dL — ABNORMAL LOW (ref 3.5–5.0)
Alkaline Phosphatase: 59 U/L (ref 38–126)
Anion gap: 9 (ref 5–15)
BILIRUBIN TOTAL: 0.4 mg/dL (ref 0.3–1.2)
BUN: 79 mg/dL — ABNORMAL HIGH (ref 6–20)
CO2: 25 mmol/L (ref 22–32)
Calcium: 8.7 mg/dL — ABNORMAL LOW (ref 8.9–10.3)
Chloride: 99 mmol/L — ABNORMAL LOW (ref 101–111)
Creatinine, Ser: 3.79 mg/dL — ABNORMAL HIGH (ref 0.44–1.00)
GFR calc Af Amer: 11 mL/min — ABNORMAL LOW (ref >60)
GFR calc non Af Amer: 10 mL/min — ABNORMAL LOW (ref >60)
Glucose, Bld: 91 mg/dL (ref 65–99)
Potassium: 5.4 mmol/L — ABNORMAL HIGH (ref 3.5–5.1)
SODIUM: 133 mmol/L — AB (ref 135–145)
Total Protein: 5.4 g/dL — ABNORMAL LOW (ref 6.5–8.1)

## 2014-10-07 LAB — LIPASE, BLOOD: Lipase: 42 U/L (ref 22–51)

## 2014-10-07 LAB — I-STAT CG4 LACTIC ACID, ED: Lactic Acid, Venous: 0.79 mmol/L (ref 0.5–2.0)

## 2014-10-07 LAB — LACTIC ACID, PLASMA: Lactic Acid, Venous: 0.9 mmol/L (ref 0.5–2.0)

## 2014-10-07 LAB — I-STAT TROPONIN, ED: Troponin i, poc: 0.04 ng/mL (ref 0.00–0.08)

## 2014-10-07 MED ORDER — SODIUM CHLORIDE 0.9 % IV SOLN
1000.0000 mL | Freq: Once | INTRAVENOUS | Status: AC
  Administered 2014-10-07: 1000 mL via INTRAVENOUS

## 2014-10-07 MED ORDER — ONDANSETRON HCL 4 MG/2ML IJ SOLN
4.0000 mg | Freq: Once | INTRAMUSCULAR | Status: AC
  Administered 2014-10-07: 4 mg via INTRAVENOUS
  Filled 2014-10-07: qty 2

## 2014-10-07 MED ORDER — SODIUM CHLORIDE 0.9 % IV BOLUS (SEPSIS)
1000.0000 mL | Freq: Once | INTRAVENOUS | Status: AC
  Administered 2014-10-08: 1000 mL via INTRAVENOUS

## 2014-10-07 MED ORDER — CEFTRIAXONE SODIUM 1 G IJ SOLR: 1.0000 g | Freq: Once | INTRAMUSCULAR | Status: DC

## 2014-10-07 MED ORDER — SODIUM CHLORIDE 0.9 % IV SOLN
Freq: Once | INTRAVENOUS | Status: AC
  Administered 2014-10-08: 01:00:00 via INTRAVENOUS

## 2014-10-07 MED ORDER — FENTANYL CITRATE (PF) 100 MCG/2ML IJ SOLN
25.0000 ug | Freq: Once | INTRAMUSCULAR | Status: AC
  Administered 2014-10-07: 25 ug via INTRAVENOUS
  Filled 2014-10-07: qty 2

## 2014-10-07 NOTE — ED Notes (Addendum)
Pt transported by [facility] for abdominal pain x 1day.  Pt was dx with stomach cancer approx 3-4wks ago.  Pt is not currently undergoing any treatment for cancer.  Pt had paracentesis approx 2wks ago and states she feels like she has increasing pressure/pain in abdomen over the last few days.

## 2014-10-07 NOTE — ED Notes (Signed)
RN to get labs 

## 2014-10-08 ENCOUNTER — Encounter (HOSPITAL_COMMUNITY): Payer: Self-pay | Admitting: Internal Medicine

## 2014-10-08 DIAGNOSIS — R180 Malignant ascites: Secondary | ICD-10-CM

## 2014-10-08 DIAGNOSIS — N179 Acute kidney failure, unspecified: Secondary | ICD-10-CM | POA: Diagnosis present

## 2014-10-08 DIAGNOSIS — Z515 Encounter for palliative care: Secondary | ICD-10-CM

## 2014-10-08 DIAGNOSIS — R627 Adult failure to thrive: Secondary | ICD-10-CM | POA: Diagnosis present

## 2014-10-08 DIAGNOSIS — R1011 Right upper quadrant pain: Secondary | ICD-10-CM

## 2014-10-08 DIAGNOSIS — G893 Neoplasm related pain (acute) (chronic): Secondary | ICD-10-CM | POA: Insufficient documentation

## 2014-10-08 DIAGNOSIS — L8990 Pressure ulcer of unspecified site, unspecified stage: Secondary | ICD-10-CM | POA: Insufficient documentation

## 2014-10-08 LAB — CREATININE, SERUM
Creatinine, Ser: 3.64 mg/dL — ABNORMAL HIGH (ref 0.44–1.00)
GFR calc Af Amer: 12 mL/min — ABNORMAL LOW (ref >60)
GFR, EST NON AFRICAN AMERICAN: 10 mL/min — AB (ref >60)

## 2014-10-08 LAB — CBC
HCT: 31.5 % — ABNORMAL LOW (ref 36.0–46.0)
Hemoglobin: 9.9 g/dL — ABNORMAL LOW (ref 12.0–15.0)
MCH: 27.9 pg (ref 26.0–34.0)
MCHC: 31.4 g/dL (ref 30.0–36.0)
MCV: 88.7 fL (ref 78.0–100.0)
PLATELETS: 313 10*3/uL (ref 150–400)
RBC: 3.55 MIL/uL — AB (ref 3.87–5.11)
RDW: 15.8 % — ABNORMAL HIGH (ref 11.5–15.5)
WBC: 15.4 10*3/uL — AB (ref 4.0–10.5)

## 2014-10-08 MED ORDER — ONDANSETRON HCL 4 MG/2ML IJ SOLN
4.0000 mg | Freq: Four times a day (QID) | INTRAMUSCULAR | Status: DC | PRN
  Administered 2014-10-08 – 2014-10-09 (×3): 4 mg via INTRAVENOUS
  Filled 2014-10-08 (×3): qty 2

## 2014-10-08 MED ORDER — BOOST / RESOURCE BREEZE PO LIQD
1.0000 | Freq: Three times a day (TID) | ORAL | Status: DC
  Administered 2014-10-08 – 2014-10-09 (×3): 1 via ORAL

## 2014-10-08 MED ORDER — DEXTROSE 5 % IV SOLN
1.0000 g | Freq: Once | INTRAVENOUS | Status: AC
  Administered 2014-10-08: 1 g via INTRAVENOUS
  Filled 2014-10-08: qty 10

## 2014-10-08 MED ORDER — ONDANSETRON HCL 4 MG PO TABS: 4.0000 mg | ORAL_TABLET | Freq: Four times a day (QID) | ORAL | Status: DC | PRN

## 2014-10-08 MED ORDER — ADULT MULTIVITAMIN W/MINERALS CH
1.0000 | ORAL_TABLET | Freq: Every day | ORAL | Status: DC
  Administered 2014-10-08: 1 via ORAL
  Filled 2014-10-08: qty 1

## 2014-10-08 MED ORDER — ACETAMINOPHEN 325 MG PO TABS: 650.0000 mg | ORAL_TABLET | Freq: Four times a day (QID) | ORAL | Status: DC | PRN

## 2014-10-08 MED ORDER — HYDROCORTISONE 1 % EX CREA
TOPICAL_CREAM | Freq: Three times a day (TID) | CUTANEOUS | Status: DC
  Administered 2014-10-08 – 2014-10-09 (×5): via TOPICAL
  Filled 2014-10-08: qty 28

## 2014-10-08 MED ORDER — BUDESONIDE-FORMOTEROL FUMARATE 160-4.5 MCG/ACT IN AERO
2.0000 | INHALATION_SPRAY | Freq: Two times a day (BID) | RESPIRATORY_TRACT | Status: DC
  Administered 2014-10-08 – 2014-10-10 (×5): 2 via RESPIRATORY_TRACT
  Filled 2014-10-08: qty 6

## 2014-10-08 MED ORDER — AMIODARONE HCL 200 MG PO TABS
200.0000 mg | ORAL_TABLET | Freq: Every day | ORAL | Status: DC
  Administered 2014-10-08 – 2014-10-10 (×3): 200 mg via ORAL
  Filled 2014-10-08 (×3): qty 1

## 2014-10-08 MED ORDER — VITAMIN B-1 100 MG PO TABS
100.0000 mg | ORAL_TABLET | Freq: Every day | ORAL | Status: DC
  Administered 2014-10-08: 100 mg via ORAL
  Filled 2014-10-08: qty 1

## 2014-10-08 MED ORDER — LEVALBUTEROL TARTRATE 45 MCG/ACT IN AERO: 2.0000 | INHALATION_SPRAY | Freq: Four times a day (QID) | RESPIRATORY_TRACT | Status: DC | PRN

## 2014-10-08 MED ORDER — POLYETHYLENE GLYCOL 3350 17 G PO PACK: 17.0000 g | PACK | Freq: Every day | ORAL | Status: DC | PRN

## 2014-10-08 MED ORDER — DEXAMETHASONE 2 MG PO TABS
2.0000 mg | ORAL_TABLET | Freq: Two times a day (BID) | ORAL | Status: DC
  Administered 2014-10-08 – 2014-10-10 (×5): 2 mg via ORAL
  Filled 2014-10-08 (×7): qty 1

## 2014-10-08 MED ORDER — FOLIC ACID 1 MG PO TABS
1.0000 mg | ORAL_TABLET | Freq: Every day | ORAL | Status: DC
  Administered 2014-10-08: 1 mg via ORAL
  Filled 2014-10-08: qty 1

## 2014-10-08 MED ORDER — LEVALBUTEROL HCL 0.63 MG/3ML IN NEBU
0.6300 mg | INHALATION_SOLUTION | RESPIRATORY_TRACT | Status: DC | PRN
  Administered 2014-10-08: 0.63 mg via RESPIRATORY_TRACT
  Filled 2014-10-08: qty 3

## 2014-10-08 MED ORDER — MONTELUKAST SODIUM 10 MG PO TABS
10.0000 mg | ORAL_TABLET | Freq: Every day | ORAL | Status: DC
  Filled 2014-10-08: qty 1

## 2014-10-08 MED ORDER — CEFTRIAXONE SODIUM 1 G IJ SOLR
1.0000 g | INTRAMUSCULAR | Status: DC
  Administered 2014-10-08 – 2014-10-09 (×2): 1 g via INTRAVENOUS
  Filled 2014-10-08 (×5): qty 10

## 2014-10-08 MED ORDER — ACETAMINOPHEN 650 MG RE SUPP: 650.0000 mg | Freq: Four times a day (QID) | RECTAL | Status: DC | PRN

## 2014-10-08 MED ORDER — SODIUM CHLORIDE 0.9 % IV SOLN
INTRAVENOUS | Status: DC
  Administered 2014-10-08: 12:00:00 via INTRAVENOUS

## 2014-10-08 MED ORDER — LORAZEPAM 0.5 MG PO TABS
0.5000 mg | ORAL_TABLET | Freq: Four times a day (QID) | ORAL | Status: DC | PRN
  Administered 2014-10-10: 0.5 mg via ORAL
  Filled 2014-10-08: qty 1

## 2014-10-08 MED ORDER — VITAMIN D3 25 MCG (1000 UNIT) PO TABS
2000.0000 [IU] | ORAL_TABLET | Freq: Every day | ORAL | Status: DC
  Administered 2014-10-08: 2000 [IU] via ORAL
  Filled 2014-10-08: qty 2

## 2014-10-08 MED ORDER — ENOXAPARIN SODIUM 30 MG/0.3ML ~~LOC~~ SOLN
30.0000 mg | Freq: Every day | SUBCUTANEOUS | Status: DC
  Filled 2014-10-08 (×2): qty 0.3

## 2014-10-08 MED ORDER — MORPHINE SULFATE (CONCENTRATE) 10 MG/0.5ML PO SOLN
5.0000 mg | ORAL | Status: DC | PRN
  Administered 2014-10-09: 5 mg via ORAL
  Filled 2014-10-08: qty 0.5

## 2014-10-08 MED ORDER — ENSURE ENLIVE PO LIQD
237.0000 mL | Freq: Two times a day (BID) | ORAL | Status: DC
  Administered 2014-10-08 – 2014-10-09 (×2): 237 mL via ORAL

## 2014-10-08 MED ORDER — SACCHAROMYCES BOULARDII 250 MG PO CAPS
250.0000 mg | ORAL_CAPSULE | Freq: Two times a day (BID) | ORAL | Status: DC
  Administered 2014-10-08 – 2014-10-10 (×6): 250 mg via ORAL
  Filled 2014-10-08 (×7): qty 1

## 2014-10-08 MED ORDER — FERROUS SULFATE 325 (65 FE) MG PO TABS
325.0000 mg | ORAL_TABLET | Freq: Every day | ORAL | Status: DC
  Administered 2014-10-08: 325 mg via ORAL
  Filled 2014-10-08: qty 1

## 2014-10-08 MED ORDER — PANTOPRAZOLE SODIUM 40 MG PO TBEC
40.0000 mg | DELAYED_RELEASE_TABLET | Freq: Every day | ORAL | Status: DC
  Administered 2014-10-08 – 2014-10-10 (×3): 40 mg via ORAL
  Filled 2014-10-08 (×3): qty 1

## 2014-10-08 MED ORDER — MORPHINE SULFATE 2 MG/ML IJ SOLN
1.0000 mg | INTRAMUSCULAR | Status: DC | PRN
  Administered 2014-10-08 – 2014-10-10 (×4): 1 mg via INTRAVENOUS
  Filled 2014-10-08 (×4): qty 1

## 2014-10-08 MED ORDER — VITAMIN D3 25 MCG (1000 UNIT) PO TABS
1000.0000 [IU] | ORAL_TABLET | Freq: Every day | ORAL | Status: DC
  Filled 2014-10-08: qty 1

## 2014-10-08 NOTE — Progress Notes (Signed)
ANTIBIOTIC CONSULT NOTE - INITIAL  Pharmacy Consult for Rocephin Indication: Intra-abdominal infection  Allergies  Allergen Reactions  . Baby Powder [Methylbenzethonium] Shortness Of Breath  . Demerol [Meperidine] Hives  . Fexofenadine Other (See Comments)    dizziness  . Penicillins Hives    Tolerates Ceftriaxone.  Marland Kitchen Spiriva Handihaler [Tiotropium Bromide Monohydrate]     Caused chest pains    Patient Measurements:   Wt=71 kg  Vital Signs: Temp: 97.6 F (36.4 C) (08/03 0124) Temp Source: Oral (08/03 0124) BP: 93/50 mmHg (08/03 0124) Pulse Rate: 97 (08/03 0124) Intake/Output from previous day:   Intake/Output from this shift:    Labs:  Recent Labs  10/07/14 2234 10/08/14 0318  WBC 16.3* 15.4*  HGB 9.9* 9.9*  PLT 333 313  CREATININE 3.79*  --    Estimated Creatinine Clearance: 9.3 mL/min (by C-G formula based on Cr of 3.79). No results for input(s): VANCOTROUGH, VANCOPEAK, VANCORANDOM, GENTTROUGH, GENTPEAK, GENTRANDOM, TOBRATROUGH, TOBRAPEAK, TOBRARND, AMIKACINPEAK, AMIKACINTROU, AMIKACIN in the last 72 hours.   Microbiology: Recent Results (from the past 720 hour(s))  Urine culture     Status: None   Collection Time: 09/12/14  2:56 PM  Result Value Ref Range Status   Specimen Description URINE, CLEAN CATCH  Final   Special Requests NONE  Final   Culture   Final    >=100,000 COLONIES/mL ESCHERICHIA COLI Performed at [facility]    Report Status 09/15/2014 FINAL  Final   Organism ID, Bacteria ESCHERICHIA COLI  Final      Susceptibility   Escherichia coli - MIC*    AMPICILLIN <=2 SENSITIVE Sensitive     CEFAZOLIN <=4 SENSITIVE Sensitive     CEFTRIAXONE <=1 SENSITIVE Sensitive     CIPROFLOXACIN >=4 RESISTANT Resistant     GENTAMICIN <=1 SENSITIVE Sensitive     IMIPENEM <=0.25 SENSITIVE Sensitive     NITROFURANTOIN <=16 SENSITIVE Sensitive     TRIMETH/SULFA <=20 SENSITIVE Sensitive     AMPICILLIN/SULBACTAM <=2 SENSITIVE Sensitive     PIP/TAZO  <=4 SENSITIVE Sensitive     * >=100,000 COLONIES/mL ESCHERICHIA COLI  Culture, body fluid-bottle     Status: None   Collection Time: 09/18/14 12:33 PM  Result Value Ref Range Status   Specimen Description FLUID ABDOMEN PERITONEAL  Final   Special Requests BAA 5MLS  Final   Culture NO GROWTH 5 DAYS  Final   Report Status 09/23/2014 FINAL  Final  Gram stain     Status: None   Collection Time: 09/18/14 12:33 PM  Result Value Ref Range Status   Specimen Description FLUID ABDOMEN PERITONEAL  Final   Special Requests NONE  Final   Gram Stain   Final    MODERATE WBC PRESENT, PREDOMINANTLY PMN NO ORGANISMS SEEN    Report Status 09/19/2014 FINAL  Final  AFB culture with smear     Status: None (Preliminary result)   Collection Time: 09/23/14 10:39 AM  Result Value Ref Range Status   Specimen Description ABDOMEN  Final   Special Requests NONE  Final   Acid Fast Smear   Final    NO ACID FAST BACILLI SEEN Performed at [facility]    Culture   Final    CULTURE WILL BE EXAMINED FOR 6 WEEKS BEFORE ISSUING A FINAL REPORT Performed at [facility]    Report Status PENDING  Incomplete  Culture, body fluid-bottle     Status: None   Collection Time: 09/23/14 10:39 AM  Result Value Ref Range  Status   Specimen Description FLUID ABDOMEN PERITONEAL  Final   Special Requests NONE  Final   Culture NO GROWTH 5 DAYS  Final   Report Status 09/28/2014 FINAL  Final  Gram stain     Status: None   Collection Time: 09/23/14 10:39 AM  Result Value Ref Range Status   Specimen Description FLUID ABDOMEN PERITONEAL  Final   Special Requests NONE  Final   Gram Stain   Final    ABUNDANT WBC PRESENT,BOTH PMN AND MONONUCLEAR NO ORGANISMS SEEN    Report Status 09/23/2014 FINAL  Final  Clostridium Difficile by PCR (not at [facility])     Status: None   Collection Time: 09/26/14 10:26 PM  Result Value Ref Range Status   Toxigenic C Difficile by pcr NEGATIVE NEGATIVE Final  Culture, Urine      Status: None   Collection Time: 09/28/14 11:36 PM  Result Value Ref Range Status   Specimen Description URINE, CLEAN CATCH  Final   Special Requests NONE  Final   Culture   Final    NO GROWTH 1 DAY Performed at [facility]    Report Status 09/30/2014 FINAL  Final    Medical History: Past Medical History  Diagnosis Date  . Hypertension   . Hyperlipidemia   . COPD (chronic obstructive pulmonary disease)   . Osteoporosis   . H/O hiatal hernia   . Chronic back pain   . CAD (coronary artery disease)     LHC 9/05:  pLAD less than 20%, D1 20-30%, ostial RCA 40-50%, proximal RCA 20%, EF 60%.  . C. difficile diarrhea   . Gout   . Atrial fibrillation     a. failed DCCV => b. amiodarone added => converted to NSR on Amiodarone;  c. Xarelto d/c'd 2/2 falling (wrist and hip Fx in 1/14)  . Carotid stenosis     s/p bilat CEA  . C. difficile colitis     10/2011;  11/2011  . Salmonella enteritis 08/2011    c/b septic shock  . Hx of echocardiogram     a. Echocardiogram 09/29/11: Mild LVH, EF 45-50%, diffuse HK, mild to moderate aortic stenosis, mean gradient 12 mmHg, moderate MAC, mild MR, moderate LAE, moderate RAE, question small secundum ASD with left to right flow not seen in 4 chamber view, PASP 35-39 (mild pulmonary hypertension). ;  b.  TEE on 10/04/11: Mild LVH, EF 55%, mild MR, no defect or PFO  . Pneumonia 11/2101  . PONV (postoperative nausea and vomiting)   . CHF (congestive heart failure)   . Chronic kidney disease   . GERD (gastroesophageal reflux disease)   . Hepatitis   . Anemia   . Hx of echocardiogram     Echo (09/2013): EF 55%, normal wall motion, mild aortic stenosis (mean 12 mm Hg), MAC, mild MR, mild LAE, mild PI, PASP 30 mm Hg  . Hx of cardiovascular stress test     Lexiscan Myoview (7/15):  No ischemia or infarct, EF 50%, Low Risk  . Arthritis     Neck  . PAF (paroxysmal atrial fibrillation)     Medications:  Prescriptions prior to admission  Medication Sig  Dispense Refill Last Dose  . amiodarone (PACERONE) 200 MG tablet Take 1 tablet (200 mg total) by mouth daily. 90 tablet 4 10/07/2014 at 0800  . budesonide-formoterol (SYMBICORT) 160-4.5 MCG/ACT inhaler Inhale 2 puffs into the lungs 2 (two) times daily.   10/06/2014 at 1700  . cholecalciferol (VITAMIN D)  1000 UNITS tablet Take 1,000-2,000 Units by mouth 2 (two) times daily. Take 2000 units in the morning and 1000 units in the evening   10/06/2014 at 1700  . clotrimazole (MYCELEX) 10 MG troche Take 1 tablet (10 mg total) by mouth 5 (five) times daily.   10/06/2014 at 2200  . diltiazem (TIAZAC) 240 MG 24 hr capsule Take 240 mg by mouth daily.   10/07/2014 at 0800  . Docusate Calcium (STOOL SOFTENER PO) Take 1 tablet by mouth as needed (constipation).   unknown at unknown  . feeding supplement, ENSURE ENLIVE, (ENSURE ENLIVE) LIQD Take 237 mLs by mouth 2 (two) times daily between meals. 237 mL 12 10/07/2014 at 0800  . ferrous sulfate 325 (65 FE) MG tablet Take 325 mg by mouth daily.    10/07/2014 at 0800  . folic acid (FOLVITE) 1 MG tablet Take 1 tablet (1 mg total) by mouth daily.   10/07/2014 at 0800  . furosemide (LASIX) 20 MG tablet Take 1 tablet (20 mg total) by mouth daily. (Patient taking differently: Take 40 mg by mouth daily as needed for fluid or edema. ) 30 tablet 11 10/07/2014 at 0800  . levalbuterol (XOPENEX HFA) 45 MCG/ACT inhaler Inhale 2 puffs into the lungs every 6 (six) hours as needed for wheezing.    10/02/2014 at unknown  . levalbuterol (XOPENEX) 0.63 MG/3ML nebulizer solution Take 0.63 mg by nebulization every 4 (four) hours as needed for wheezing or shortness of breath.   10/02/2014 at unknown  . montelukast (SINGULAIR) 10 MG tablet Take 10 mg by mouth at bedtime.   10/06/2014 at 2100  . Multiple Vitamin (MULTIVITAMIN WITH MINERALS) TABS tablet Take 1 tablet by mouth daily.   10/07/2014 at 0800  . omeprazole (PRILOSEC) 40 MG capsule Take 40 mg by mouth every evening.   10/06/2014 at 1630  . ondansetron  (ZOFRAN) 4 MG tablet Take 1 tablet (4 mg total) by mouth every 6 (six) hours as needed for nausea. 20 tablet 0 10/07/2014 at 2000  . polyethylene glycol (MIRALAX / GLYCOLAX) packet Take 17 g by mouth daily as needed for mild constipation.   unknown at unknown  . raloxifene (EVISTA) 60 MG tablet Take 60 mg by mouth every morning.    10/07/2014 at 0800  . Saccharomyces boulardii (FLORASTOR PO) Take 1 tablet by mouth 2 (two) times daily.   10/07/2014 at 1700  . thiamine 100 MG tablet Take 1 tablet (100 mg total) by mouth daily.   10/07/2014 at 0800  . traMADol (ULTRAM) 50 MG tablet Take 2 tablets (100 mg total) by mouth every 6 (six) hours as needed for moderate pain. (Patient taking differently: Take 25 mg by mouth every 6 (six) hours as needed for moderate pain. ) 30 tablet 0 10/07/2014 at 2000  . dexamethasone (DECADRON) 2 MG tablet Take 2 mg by mouth 2 (two) times daily with a meal.      Scheduled:  . amiodarone  200 mg Oral Daily  . budesonide-formoterol  2 puff Inhalation BID  . cefTRIAXone (ROCEPHIN)  IV  1 g Intravenous Q24H  . cholecalciferol  1,000 Units Oral QHS  . cholecalciferol  2,000 Units Oral Daily  . dexamethasone  2 mg Oral BID WC  . enoxaparin (LOVENOX) injection  30 mg Subcutaneous Daily  . feeding supplement (ENSURE ENLIVE)  237 mL Oral BID BM  . ferrous sulfate  325 mg Oral Daily  . folic acid  1 mg Oral Daily  . montelukast  10  mg Oral QHS  . multivitamin with minerals  1 tablet Oral Daily  . pantoprazole  40 mg Oral Daily  . saccharomyces boulardii  250 mg Oral BID  . thiamine  100 mg Oral Daily   Infusions:  . sodium chloride     Assessment: 89 yoF c/o abdominal pain.  Rocephin per Rx for intra-abdominal infection.  Goal of Therapy:  Treat infection  Plan:   Rocephin 1Gm IV q24h  F/u cultures  Dorrene German 10/08/2014,3:39 AM

## 2014-10-08 NOTE — ED Provider Notes (Signed)
CSN: [id number]     Arrival date & time 10/07/14  2041 History   First MD Initiated Contact with Patient 10/07/14 2054     Chief Complaint  Patient presents with  . Abdominal Pain    Abd pain x 1day - hx of stomach CA.       (Consider location/radiation/quality/duration/timing/severity/associated sxs/prior Treatment) Patient is a 81 y.o. female presenting with abdominal pain. The history is provided by the patient and a relative.  Abdominal Pain Pain location:  RUQ Pain quality: sharp and shooting   Pain radiates to:  Does not radiate Pain severity:  Moderate Onset quality:  Sudden Duration:  2 days Timing:  Intermittent Progression:  Waxing and waning Chronicity:  Recurrent Relieved by:  Nothing Worsened by:  Nothing tried Ineffective treatments:  None tried Associated symptoms: chest pain (R sided)   Associated symptoms: no anorexia, no chills, no dysuria, no fever, no nausea, no shortness of breath and no vomiting    81 yo F with chief complaints of right-sided chest wall pain. This been going on for a couple days. Patient has a history of adenocarcinoma of the stomach patient currently under palliative care. While at the nursing home patient was complaining of being extremely thirsty. Family was concerned that she wasn't getting proper care there and was not getting any fluids. They then called 911 and had the patient taken here for evaluation. They deny any fevers or chills any nausea or vomiting. Patient had a paracentesis performed a few weeks ago and was profoundly hypotensive.  Past Medical History  Diagnosis Date  . Hypertension   . Hyperlipidemia   . COPD (chronic obstructive pulmonary disease)   . Osteoporosis   . H/O hiatal hernia   . Chronic back pain   . CAD (coronary artery disease)     LHC 9/05:  pLAD less than 20%, D1 20-30%, ostial RCA 40-50%, proximal RCA 20%, EF 60%.  . C. difficile diarrhea   . Gout   . Atrial fibrillation     a. failed DCCV => b.  amiodarone added => converted to NSR on Amiodarone;  c. Xarelto d/c'd 2/2 falling (wrist and hip Fx in 1/14)  . Carotid stenosis     s/p bilat CEA  . C. difficile colitis     10/2011;  11/2011  . Salmonella enteritis 08/2011    c/b septic shock  . Hx of echocardiogram     a. Echocardiogram 09/29/11: Mild LVH, EF 45-50%, diffuse HK, mild to moderate aortic stenosis, mean gradient 12 mmHg, moderate MAC, mild MR, moderate LAE, moderate RAE, question small secundum ASD with left to right flow not seen in 4 chamber view, PASP 35-39 (mild pulmonary hypertension). ;  b.  TEE on 10/04/11: Mild LVH, EF 55%, mild MR, no defect or PFO  . Pneumonia 11/2101  . PONV (postoperative nausea and vomiting)   . CHF (congestive heart failure)   . Chronic kidney disease   . GERD (gastroesophageal reflux disease)   . Hepatitis   . Anemia   . Hx of echocardiogram     Echo (09/2013): EF 55%, normal wall motion, mild aortic stenosis (mean 12 mm Hg), MAC, mild MR, mild LAE, mild PI, PASP 30 mm Hg  . Hx of cardiovascular stress test     Lexiscan Myoview (7/15):  No ischemia or infarct, EF 50%, Low Risk  . Arthritis     Neck  . PAF (paroxysmal atrial fibrillation)    Past Surgical History  Procedure Laterality  Date  . Carotid endarterectomy      bilateral  . Cataract extraction, bilateral    . Joint replacement  2008    Right Total Knee  . Anal fistula repair    . Left parotidectomy    . Eye surgery    . Flexible sigmoidoscopy  09/03/2011    Procedure: FLEXIBLE SIGMOIDOSCOPY;  Surgeon: Beryle Beams, MD;  Location: [facility];  Service: Endoscopy;  Laterality: N/A;  . Tee without cardioversion  10/04/2011    Procedure: TRANSESOPHAGEAL ECHOCARDIOGRAM (TEE);  Surgeon: Larey Dresser, MD;  Location: [place];  Service: Cardiovascular;  Laterality: N/A;  to be carelinked here by 1230-verified 7/29/dl  . Cardioversion  10/04/2011    Procedure: CARDIOVERSION;  Surgeon: Larey Dresser, MD;  Location: [facility];   Service: Cardiovascular;  Laterality: N/A;  . Esophagogastroduodenoscopy  11/29/2011    Procedure: ESOPHAGOGASTRODUODENOSCOPY (EGD);  Surgeon: Cleotis Nipper, MD;  Location: [facility];  Service: Endoscopy;  Laterality: N/A;  recent h/o resp failure/pneumonia  . Left hip fracture repair  03/14/12  . Open reduction internal fixation (orif) scaphoid with distal radius graft  03/14/2012    Procedure: OPEN REDUCTION INTERNAL FIXATION (ORIF) SCAPHOID WITH DISTAL RADIUS GRAFT;  Surgeon: Jolyn Nap, MD;  Location: [facility];  Service: Orthopedics;  Laterality: Left;  DVR wrist fracture set/ hand innovation  . Hip pinning,cannulated  03/14/2012    Procedure: CANNULATED HIP PINNING;  Surgeon: Jolyn Nap, MD;  Location: [facility];  Service: Orthopedics;  Laterality: Left;  Biomet 6.5 cannulated screws  . Femur im nail Left 04/13/2012    Procedure: INTRAMEDULLARY (IM) NAIL FEMORAL;  Surgeon: Jolyn Nap, MD;  Location: [place];  Service: Orthopedics;  Laterality: Left;  OPEN REDUCTION INTERNAL FIXATION LEFT PROXIMAL FEMUR Fracture   . Esophagogastroduodenoscopy Left 09/29/2014    Procedure: ESOPHAGOGASTRODUODENOSCOPY (EGD);  Surgeon: Laurence Spates, MD;  Location: [facility];  Service: Endoscopy;  Laterality: Left;   Family History  Problem Relation Age of Onset  . Hemochromatosis Sister   . Diabetes Sister   . Hypertension Sister   . Diabetes type II Mother   . Diabetes Mother   . Hypertension Mother   . Deep vein thrombosis Mother   . Hypertension Father   . Hypertension Brother   . Other Brother     Leg amputation-Gun shot  . Depression Brother   . Hemochromatosis Sister   . Heart attack Neg Hx   . Stroke Neg Hx   . Cancer Sister    History  Substance Use Topics  . Smoking status: Former Smoker -- 1.00 packs/day for 20 years    Types: Cigarettes    Quit date: 09/01/1983  . Smokeless tobacco: Never Used  . Alcohol Use: No   OB History    No data available     Review of  Systems  Constitutional: Negative for fever and chills.  HENT: Negative for congestion and rhinorrhea.   Eyes: Negative for redness and visual disturbance.  Respiratory: Negative for shortness of breath and wheezing.   Cardiovascular: Positive for chest pain (R sided). Negative for palpitations.  Gastrointestinal: Positive for abdominal pain. Negative for nausea, vomiting and anorexia.  Genitourinary: Negative for dysuria and urgency.  Musculoskeletal: Negative for myalgias and arthralgias.  Skin: Negative for pallor and wound.  Neurological: Negative for dizziness and headaches.      Allergies  Baby powder; Demerol; Fexofenadine; Penicillins; and Spiriva handihaler  Home Medications   Prior to Admission medications  Medication Sig Start Date End Date Taking? Authorizing Provider  amiodarone (PACERONE) 200 MG tablet Take 1 tablet (200 mg total) by mouth daily. 04/07/14  Yes Luke K Kilroy, PA-C  budesonide-formoterol (SYMBICORT) 160-4.5 MCG/ACT inhaler Inhale 2 puffs into the lungs 2 (two) times daily.   Yes Historical Provider, MD  cholecalciferol (VITAMIN D) 1000 UNITS tablet Take 1,000-2,000 Units by mouth 2 (two) times daily. Take 2000 units in the morning and 1000 units in the evening   Yes Historical Provider, MD  clotrimazole (MYCELEX) 10 MG troche Take 1 tablet (10 mg total) by mouth 5 (five) times daily. 10/02/14  Yes Christina P Rama, MD  diltiazem (TIAZAC) 240 MG 24 hr capsule Take 240 mg by mouth daily.   Yes Historical Provider, MD  Docusate Calcium (STOOL SOFTENER PO) Take 1 tablet by mouth as needed (constipation).   Yes Historical Provider, MD  feeding supplement, ENSURE ENLIVE, (ENSURE ENLIVE) LIQD Take 237 mLs by mouth 2 (two) times daily between meals. 10/02/14  Yes Christina P Rama, MD  ferrous sulfate 325 (65 FE) MG tablet Take 325 mg by mouth daily.    Yes Historical Provider, MD  folic acid (FOLVITE) 1 MG tablet Take 1 tablet (1 mg total) by mouth daily. 10/02/14  Yes  Christina P Rama, MD  furosemide (LASIX) 20 MG tablet Take 1 tablet (20 mg total) by mouth daily. Patient taking differently: Take 40 mg by mouth daily as needed for fluid or edema.  07/25/14  Yes Burnell Blanks, MD  levalbuterol [facility] HFA) 45 MCG/ACT inhaler Inhale 2 puffs into the lungs every 6 (six) hours as needed for wheezing.    Yes Historical Provider, MD  levalbuterol Penne Lash) 0.63 MG/3ML nebulizer solution Take 0.63 mg by nebulization every 4 (four) hours as needed for wheezing or shortness of breath.   Yes Historical Provider, MD  montelukast (SINGULAIR) 10 MG tablet Take 10 mg by mouth at bedtime.   Yes Historical Provider, MD  Multiple Vitamin (MULTIVITAMIN WITH MINERALS) TABS tablet Take 1 tablet by mouth daily. 10/02/14  Yes Christina P Rama, MD  omeprazole (PRILOSEC) 40 MG capsule Take 40 mg by mouth every evening.   Yes Historical Provider, MD  ondansetron (ZOFRAN) 4 MG tablet Take 1 tablet (4 mg total) by mouth every 6 (six) hours as needed for nausea. 10/02/14  Yes Christina P Rama, MD  polyethylene glycol (MIRALAX / GLYCOLAX) packet Take 17 g by mouth daily as needed for mild constipation.   Yes Historical Provider, MD  raloxifene (EVISTA) 60 MG tablet Take 60 mg by mouth every morning.    Yes Historical Provider, MD  Saccharomyces boulardii (FLORASTOR PO) Take 1 tablet by mouth 2 (two) times daily.   Yes Historical Provider, MD  thiamine 100 MG tablet Take 1 tablet (100 mg total) by mouth daily. 10/02/14  Yes Venetia Maxon Rama, MD  traMADol (ULTRAM) 50 MG tablet Take 2 tablets (100 mg total) by mouth every 6 (six) hours as needed for moderate pain. Patient taking differently: Take 25 mg by mouth every 6 (six) hours as needed for moderate pain.  10/02/14  Yes Venetia Maxon Rama, MD  dexamethasone (DECADRON) 2 MG tablet Take 2 mg by mouth 2 (two) times daily with a meal.    Historical Provider, MD   BP 94/59 mmHg  Pulse 99  Temp(Src) 99 F (37.2 C) (Oral)  Resp 20  SpO2  98% Physical Exam  Constitutional: She is oriented to person, place, and time. She appears  cachectic. No distress.  HENT:  Head: Normocephalic and atraumatic.  Eyes: EOM are normal. Pupils are equal, round, and reactive to light.  Neck: Normal range of motion. Neck supple.  Cardiovascular: Normal rate and regular rhythm.  Exam reveals no gallop and no friction rub.   No murmur heard. Pulmonary/Chest: Effort normal. She has no wheezes. She has no rales. She exhibits no tenderness.  Abdominal: Soft. She exhibits distension. There is no tenderness. There is no rebound and no guarding.  + fluid wave  Musculoskeletal: She exhibits edema (marked distal edema, 4+ to the thighs). She exhibits no tenderness.  Neurological: She is alert and oriented to person, place, and time.  Skin: Skin is warm and dry. She is not diaphoretic.  Psychiatric: She has a normal mood and affect. Her behavior is normal.    ED Course  Procedures (including critical care time) Labs Review Labs Reviewed  CBC WITH DIFFERENTIAL/PLATELET - Abnormal; Notable for the following:    WBC 16.3 (*)    RBC 3.50 (*)    Hemoglobin 9.9 (*)    HCT 31.0 (*)    RDW 15.7 (*)    Neutrophils Relative % 91 (*)    Neutro Abs 14.9 (*)    Lymphocytes Relative 2 (*)    Lymphs Abs 0.3 (*)    Monocytes Absolute 1.1 (*)    All other components within normal limits  COMPREHENSIVE METABOLIC PANEL - Abnormal; Notable for the following:    Sodium 133 (*)    Potassium 5.4 (*)    Chloride 99 (*)    BUN 79 (*)    Creatinine, Ser 3.79 (*)    Calcium 8.7 (*)    Total Protein 5.4 (*)    Albumin 1.7 (*)    AST 43 (*)    GFR calc non Af Amer 10 (*)    GFR calc Af Amer 11 (*)    All other components within normal limits  LACTIC ACID, PLASMA  LIPASE, BLOOD  I-STAT CG4 LACTIC ACID, ED  I-STAT TROPOININ, ED    Imaging Review Ct Abdomen Pelvis Wo Contrast  10/07/2014   CLINICAL DATA:  Increasing pain and pressure in the abdomen over the  past few days. Paracentesis 2 weeks ago.  EXAM: CT CHEST, ABDOMEN AND PELVIS WITHOUT CONTRAST  TECHNIQUE: Multidetector CT imaging of the chest, abdomen and pelvis was performed following the standard protocol without IV contrast.  COMPARISON:  09/12/2014  FINDINGS: CT CHEST FINDINGS  No mediastinal or hilar adenopathy is evident, with mild study limitations due to the lack of intravenous contrast. Axillary regions appear unremarkable. No pulmonary metastases are evident. There are mild parenchymal lung opacities in the posterior lower lobes, adjacent to the small pleural effusions. This may be atelectatic.  CT ABDOMEN AND PELVIS FINDINGS  There is a moderately large volume peritoneal ascites, similar to the CT of 09/12/2014. There are unremarkable unenhanced appearances of the liver, with no evidence of hepatic metastasis. The spleen, pancreas, adrenals and kidneys appear unremarkable. Stomach is mildly distended, with mural thickening of the antral region. Small bowel is unremarkable. There is stranding opacity and a suggestion of nodularity in the omentum, suspicious for neoplastic involvement. The uterus and ovaries appear unremarkable.  No significant skeletal lesions are evident. Moderate kyphosis and moderate thoracolumbar degenerative disc changes are present. There is a grade 1 spondylolisthesis at L4-5 which appears degenerative. There also is minimal degenerative appearing retrolisthesis at L5-S1.  IMPRESSION: Moderately large volume peritoneal ascites. Suspicious stranding opacities in  the omentum, likely neoplastic. Mural thickening in the gastric antrum again noted.  Small pleural effusions with adjacent atelectatic appearing posterior lower lobe alveolar opacities.  No pulmonary or hepatic metastases are evident. No significant skeletal lesions are evident.   Electronically Signed   By: Andreas Newport M.D.   On: 10/07/2014 23:38   Ct Chest Wo Contrast  10/07/2014   CLINICAL DATA:  Increasing pain  and pressure in the abdomen over the past few days. Paracentesis 2 weeks ago.  EXAM: CT CHEST, ABDOMEN AND PELVIS WITHOUT CONTRAST  TECHNIQUE: Multidetector CT imaging of the chest, abdomen and pelvis was performed following the standard protocol without IV contrast.  COMPARISON:  09/12/2014  FINDINGS: CT CHEST FINDINGS  No mediastinal or hilar adenopathy is evident, with mild study limitations due to the lack of intravenous contrast. Axillary regions appear unremarkable. No pulmonary metastases are evident. There are mild parenchymal lung opacities in the posterior lower lobes, adjacent to the small pleural effusions. This may be atelectatic.  CT ABDOMEN AND PELVIS FINDINGS  There is a moderately large volume peritoneal ascites, similar to the CT of 09/12/2014. There are unremarkable unenhanced appearances of the liver, with no evidence of hepatic metastasis. The spleen, pancreas, adrenals and kidneys appear unremarkable. Stomach is mildly distended, with mural thickening of the antral region. Small bowel is unremarkable. There is stranding opacity and a suggestion of nodularity in the omentum, suspicious for neoplastic involvement. The uterus and ovaries appear unremarkable.  No significant skeletal lesions are evident. Moderate kyphosis and moderate thoracolumbar degenerative disc changes are present. There is a grade 1 spondylolisthesis at L4-5 which appears degenerative. There also is minimal degenerative appearing retrolisthesis at L5-S1.  IMPRESSION: Moderately large volume peritoneal ascites. Suspicious stranding opacities in the omentum, likely neoplastic. Mural thickening in the gastric antrum again noted.  Small pleural effusions with adjacent atelectatic appearing posterior lower lobe alveolar opacities.  No pulmonary or hepatic metastases are evident. No significant skeletal lesions are evident.   Electronically Signed   By: Andreas Newport M.D.   On: 10/07/2014 23:38   Dg Chest Port 1  View  10/07/2014   CLINICAL DATA:  Acute onset of generalized abdominal pain. Initial encounter.  EXAM: PORTABLE CHEST - 1 VIEW  COMPARISON:  Chest radiograph performed 09/23/2014  FINDINGS: The lungs are mildly hypoexpanded. Mild vascular congestion is noted. Mild bibasilar atelectasis or scarring is seen. There is no evidence of pleural effusion or pneumothorax.  The cardiomediastinal silhouette is mildly enlarged. No acute osseous abnormalities are seen. Clips are seen overlying the right side of the neck.  IMPRESSION: Mild vascular congestion and mild cardiomegaly noted. Mild bibasilar atelectasis or scarring seen. Lungs mildly hypoexpanded.   Electronically Signed   By: Garald Balding M.D.   On: 10/07/2014 21:38     EKG Interpretation None      MDM   Final diagnoses:  Chest pain  Ascites  AKI (acute kidney injury)    81 yo F on palliative care with adenocarcinoma comes in with a chief complaint of right-sided chest pain. CT scan of the chest abdomen and pelvis with marked ascites. Patient with hypertension on arrival the patient maintain alertness. Blood pressure improved transiently with a liter of fluids. Patient's renal function significantly worsening. Will start second liter started patient on a rate. Spoke with hospitalist will admit for pain control and fluids.  The patients results and plan were reviewed and discussed.   Any x-rays performed were independently reviewed by myself.  Differential diagnosis were considered with the presenting HPI.  Medications  sodium chloride 0.9 % bolus 1,000 mL (not administered)  0.9 %  sodium chloride infusion (not administered)  cefTRIAXone (ROCEPHIN) 1 g in dextrose 5 % 50 mL IVPB (not administered)  0.9 %  sodium chloride infusion (0 mLs Intravenous Stopped 10/07/14 2224)  ondansetron (ZOFRAN) injection 4 mg (4 mg Intravenous Given 10/07/14 2244)  fentaNYL (SUBLIMAZE) injection 25 mcg (25 mcg Intravenous Given 10/07/14 2244)    Filed  Vitals:   10/07/14 2057 10/07/14 2250  BP: 78/59 94/59  Pulse: 110 99  Temp: 99 F (37.2 C)   TempSrc: Oral   Resp: 24 20  SpO2: 95% 98%    Final diagnoses:  Chest pain  Ascites  AKI (acute kidney injury)    Admission/ observation were discussed with the admitting physician, patient and/or family and they are comfortable with the plan.      Deno Etienne, DO 10/08/14 [id number]

## 2014-10-08 NOTE — ED Notes (Signed)
Nurse on 3W unavailable for report and will call back shortly.

## 2014-10-08 NOTE — Consult Note (Signed)
Consultation Note Date: 10/08/2014   Patient Name: [name] [name]  DOB: [birth year]  MRN: [id number]  Age / Sex: 81 y.o., female   PCP: Carlos Levering, PA-C Referring Physician: Nita Sells, MD  Reason for Consultation: Establishing goals of care, Non pain symptom management, Pain control and Psychosocial/spiritual support  Palliative Care Assessment and Plan Summary of Established Goals of Care and Medical Treatment Preferences    Palliative Care Discussion Held Today:     This NP Wadie Lessen reviewed medical records, received report from team, assessed the patient and then meet at the patient's bedside along with her daughter [name]  to discuss diagnosis, prognosis, GOC, EOL wishes disposition and options.  A detailed discussion was had today regarding advanced directives.  Concepts specific to code status, artifical feeding and hydration, continued IV antibiotics and rehospitalization was had.  The difference between a aggressive medical intervention path  and a palliative comfort care path for this patient at this time was had.  Values and goals of care important to patient and family were attempted to be elicited.    MOST form was introduced  Concept of Hospice and Palliative Care were discussed  Natural trajectory and expectations at EOL were discussed.  Questions and concerns addressed.   Family encouraged to call with questions or concerns.  PMT will continue to support holistically.  Both patient and daughter understand the cancer diagnosis and support a full comfort approach.  Plan is for tunneled cath for intermittent paracentesis, discharge home with hospice once stable and transition to residential hospice when eligable.    Primary Decision Maker: Patient with support of her only living daughter  Goals of Care/Code Status/Advance Care Planning:   Code Status:  DNR/DNI  Hopeful for tunneled catheter for ongoing paracentesis, discussed risk of infection with  patient and family, this is a comfort measure in an EOL situation   Artificial feeding: none  Antibiotics: continue until discharge and then make decision regarding antibiotics if/when infection occurs  Minimize medications-vitamins dc  Rehospitalization: avoid   Symptom Management:    Pain/Dyspnea: Morphine 1 mg IV every 4 hrs prn/Roxanol 5 mg po/sl every 2 hrs prn  Ativan: 0.5 mg po/sl every 6 hrs prn  Tunneled cath for ascites/discussed with Dr Verlon Au  Psycho-social/Spiritual:   Patient was very vocal in sharing her life story 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  She shared stories of travel and her "wonderful life"    Support System: Theatre manager  Desire for further Chaplaincy support:yes-consulted  Prognosis: < 6 months, likely less that 2-3 months  Discharge Planning:  Home with Hospice       Chief Complaint: abdominal pain  History of Present Illness:   [name] [name] is a 81 y.o. female who was recently diagnosed with gastric adenocarcinoma with malignant ascites and was considered to have poor prognosis was brought to the ER from the nursing home after patient was complaining of abdominal pain and is having poor appetite. Patient abdomen pain is mostly in the upper quadrants. Patient also was found to have increasing abdominal girth consistent with worsening ascites. In the ER patient was found to be hypotensive. CT chest and abdomen was done which shows worsening ascites. Patient was given fluid boluses. Patient's creatinine has worsened from baseline. On exam patient is not in distress. Patient's daughter is at the bedside. Plan at this time is to keep patient comfortable and no aggressive measures.   Hopeful for tunneled cath for ascites and  home with hospice  Primary Diagnoses  Present on Admission:  . Abdominal pain . Failure to thrive in adult . Acute renal failure . Ascites . Atrial fibrillation . Chronic combined systolic  and diastolic heart failure  Palliative Review of Systems:    - abdominal pain concentrated on right side, weakness   I have reviewed the medical record, interviewed the patient and family, and examined the patient. The following aspects are pertinent.  Past Medical History  Diagnosis Date  . Hypertension   . Hyperlipidemia   . COPD (chronic obstructive pulmonary disease)   . Osteoporosis   . H/O hiatal hernia   . Chronic back pain   . CAD (coronary artery disease)     LHC 9/05:  pLAD less than 20%, D1 20-30%, ostial RCA 40-50%, proximal RCA 20%, EF 60%.  . C. difficile diarrhea   . Gout   . Atrial fibrillation     a. failed DCCV => b. amiodarone added => converted to NSR on Amiodarone;  c. Xarelto d/c'd 2/2 falling (wrist and hip Fx in 1/14)  . Carotid stenosis     s/p bilat CEA  . C. difficile colitis     10/2011;  11/2011  . Salmonella enteritis 08/2011    c/b septic shock  . Hx of echocardiogram     a. Echocardiogram 09/29/11: Mild LVH, EF 45-50%, diffuse HK, mild to moderate aortic stenosis, mean gradient 12 mmHg, moderate MAC, mild MR, moderate LAE, moderate RAE, question small secundum ASD with left to right flow not seen in 4 chamber view, PASP 35-39 (mild pulmonary hypertension). ;  b.  TEE on 10/04/11: Mild LVH, EF 55%, mild MR, no defect or PFO  . Pneumonia 11/2101  . PONV (postoperative nausea and vomiting)   . CHF (congestive heart failure)   . Chronic kidney disease   . GERD (gastroesophageal reflux disease)   . Hepatitis   . Anemia   . Hx of echocardiogram     Echo (09/2013): EF 55%, normal wall motion, mild aortic stenosis (mean 12 mm Hg), MAC, mild MR, mild LAE, mild PI, PASP 30 mm Hg  . Hx of cardiovascular stress test     Lexiscan Myoview (7/15):  No ischemia or infarct, EF 50%, Low Risk  . Arthritis     Neck  . PAF (paroxysmal atrial fibrillation)    History   Social History  . Marital Status: Widowed    Spouse Name: N/A  . Number of Children: N/A  .  Years of Education: N/A   Social History Main Topics  . Smoking status: Former Smoker -- 1.00 packs/day for 20 years    Types: Cigarettes    Quit date: 09/01/1983  . Smokeless tobacco: Never Used  . Alcohol Use: No  . Drug Use: No  . Sexual Activity: No   Other Topics Concern  . None   Social History Narrative   Family History  Problem Relation Age of Onset  . Hemochromatosis Sister   . Diabetes Sister   . Hypertension Sister   . Diabetes type II Mother   . Diabetes Mother   . Hypertension Mother   . Deep vein thrombosis Mother   . Hypertension Father   . Hypertension Brother   . Other Brother     Leg amputation-Gun shot  . Depression Brother   . Hemochromatosis Sister   . Heart attack Neg Hx   . Stroke Neg Hx   . Cancer Sister    Scheduled Meds: .  amiodarone  200 mg Oral Daily  . budesonide-formoterol  2 puff Inhalation BID  . cefTRIAXone (ROCEPHIN)  IV  1 g Intravenous Q24H  . cholecalciferol  1,000 Units Oral QHS  . cholecalciferol  2,000 Units Oral Daily  . dexamethasone  2 mg Oral BID WC  . enoxaparin (LOVENOX) injection  30 mg Subcutaneous Daily  . feeding supplement (ENSURE ENLIVE)  237 mL Oral BID BM  . ferrous sulfate  325 mg Oral Daily  . folic acid  1 mg Oral Daily  . montelukast  10 mg Oral QHS  . multivitamin with minerals  1 tablet Oral Daily  . pantoprazole  40 mg Oral Daily  . saccharomyces boulardii  250 mg Oral BID  . thiamine  100 mg Oral Daily   Continuous Infusions: . sodium chloride     PRN Meds:.acetaminophen **OR** acetaminophen, levalbuterol, morphine injection, ondansetron **OR** ondansetron (ZOFRAN) IV, ondansetron, polyethylene glycol Medications Prior to Admission:  Prior to Admission medications   Medication Sig Start Date End Date Taking? Authorizing Provider  amiodarone (PACERONE) 200 MG tablet Take 1 tablet (200 mg total) by mouth daily. 04/07/14  Yes Luke K Kilroy, PA-C  budesonide-formoterol (SYMBICORT) 160-4.5 MCG/ACT  inhaler Inhale 2 puffs into the lungs 2 (two) times daily.   Yes Historical Provider, MD  cholecalciferol (VITAMIN D) 1000 UNITS tablet Take 1,000-2,000 Units by mouth 2 (two) times daily. Take 2000 units in the morning and 1000 units in the evening   Yes Historical Provider, MD  clotrimazole (MYCELEX) 10 MG troche Take 1 tablet (10 mg total) by mouth 5 (five) times daily. 10/02/14  Yes Christina P Rama, MD  diltiazem (TIAZAC) 240 MG 24 hr capsule Take 240 mg by mouth daily.   Yes Historical Provider, MD  Docusate Calcium (STOOL SOFTENER PO) Take 1 tablet by mouth as needed (constipation).   Yes Historical Provider, MD  feeding supplement, ENSURE ENLIVE, (ENSURE ENLIVE) LIQD Take 237 mLs by mouth 2 (two) times daily between meals. 10/02/14  Yes Christina P Rama, MD  ferrous sulfate 325 (65 FE) MG tablet Take 325 mg by mouth daily.    Yes Historical Provider, MD  folic acid (FOLVITE) 1 MG tablet Take 1 tablet (1 mg total) by mouth daily. 10/02/14  Yes Christina P Rama, MD  furosemide (LASIX) 20 MG tablet Take 1 tablet (20 mg total) by mouth daily. Patient taking differently: Take 40 mg by mouth daily as needed for fluid or edema.  07/25/14  Yes Burnell Blanks, MD  levalbuterol [facility] HFA) 45 MCG/ACT inhaler Inhale 2 puffs into the lungs every 6 (six) hours as needed for wheezing.    Yes Historical Provider, MD  levalbuterol Penne Lash) 0.63 MG/3ML nebulizer solution Take 0.63 mg by nebulization every 4 (four) hours as needed for wheezing or shortness of breath.   Yes Historical Provider, MD  montelukast (SINGULAIR) 10 MG tablet Take 10 mg by mouth at bedtime.   Yes Historical Provider, MD  Multiple Vitamin (MULTIVITAMIN WITH MINERALS) TABS tablet Take 1 tablet by mouth daily. 10/02/14  Yes Christina P Rama, MD  omeprazole (PRILOSEC) 40 MG capsule Take 40 mg by mouth every evening.   Yes Historical Provider, MD  ondansetron (ZOFRAN) 4 MG tablet Take 1 tablet (4 mg total) by mouth every 6 (six) hours  as needed for nausea. 10/02/14  Yes Christina P Rama, MD  polyethylene glycol (MIRALAX / GLYCOLAX) packet Take 17 g by mouth daily as needed for mild constipation.   Yes Historical  Provider, MD  raloxifene (EVISTA) 60 MG tablet Take 60 mg by mouth every morning.    Yes Historical Provider, MD  Saccharomyces boulardii (FLORASTOR PO) Take 1 tablet by mouth 2 (two) times daily.   Yes Historical Provider, MD  thiamine 100 MG tablet Take 1 tablet (100 mg total) by mouth daily. 10/02/14  Yes Venetia Maxon Rama, MD  traMADol (ULTRAM) 50 MG tablet Take 2 tablets (100 mg total) by mouth every 6 (six) hours as needed for moderate pain. Patient taking differently: Take 25 mg by mouth every 6 (six) hours as needed for moderate pain.  10/02/14  Yes Christina P Rama, MD  dexamethasone (DECADRON) 2 MG tablet Take 2 mg by mouth 2 (two) times daily with a meal.    Historical Provider, MD   Allergies  Allergen Reactions  . Baby Powder [Methylbenzethonium] Shortness Of Breath  . Demerol [Meperidine] Hives  . Fexofenadine Other (See Comments)    dizziness  . Penicillins Hives    Tolerates Ceftriaxone.  Marland Kitchen Spiriva Handihaler [Tiotropium Bromide Monohydrate]     Caused chest pains   CBC:    Component Value Date/Time   WBC 15.4* 10/08/2014 0318   HGB 9.9* 10/08/2014 0318   HCT 31.5* 10/08/2014 0318   HCT 34.9 09/22/2014 2115   PLT 313 10/08/2014 0318   MCV 88.7 10/08/2014 0318   NEUTROABS 14.9* 10/07/2014 2234   LYMPHSABS 0.3* 10/07/2014 2234   MONOABS 1.1* 10/07/2014 2234   EOSABS 0.0 10/07/2014 2234   BASOSABS 0.0 10/07/2014 2234   Comprehensive Metabolic Panel:    Component Value Date/Time   NA 133* 10/07/2014 2234   K 5.4* 10/07/2014 2234   CL 99* 10/07/2014 2234   CO2 25 10/07/2014 2234   BUN 79* 10/07/2014 2234   CREATININE 3.64* 10/08/2014 0318   GLUCOSE 91 10/07/2014 2234   CALCIUM 8.7* 10/07/2014 2234   AST 43* 10/07/2014 2234   ALT 34 10/07/2014 2234   ALKPHOS 59 10/07/2014 2234    BILITOT 0.4 10/07/2014 2234   PROT 5.4* 10/07/2014 2234   ALBUMIN 1.7* 10/07/2014 2234    Physical Exam:  Vital Signs: BP 84/48 mmHg  Pulse 104  Temp(Src) 97.4 F (36.3 C) (Oral)  Resp 20  Ht 5\' 1"  (1.549 m)  Wt 71.623 kg (157 lb 14.4 oz)  BMI 29.85 kg/m2  SpO2 92%  LMP  (LMP Unknown) SpO2: SpO2: 92 % O2 Device: O2 Device: Nasal Cannula O2 Flow Rate: O2 Flow Rate (L/min): 2 L/min Intake/output summary: No intake or output data in the 24 hours ending 10/08/14 1006 LBM: Last BM Date: 10/08/14 Baseline Weight: Weight: 71.623 kg (157 lb 14.4 oz) Most recent weight: Weight: 71.623 kg (157 lb 14.4 oz)  Exam Findings:   General: chronically ill appearing, NAD HEENT: moist buccal membranes, no exudate CVS: tachycardic Resp: decreased in bases Abd: distended, tender on palpation Extrem: BLE + edema Skin: area on both feet around and between toes with redness and reported itch Neuro: alert and oriented X3           Palliative Performance Scale: 30  %                Additional Data Reviewed: Recent Labs     10/07/14  2234  10/08/14  0318  WBC  16.3*  15.4*  HGB  9.9*  9.9*  PLT  333  313  NA  133*   --   BUN  79*   --   CREATININE  3.79*  3.64*     Time In: 0900 Time Out: 1030 Time Total:  90 minutes  Greater than 50%  of this time was spent counseling and coordinating care related to the above assessment and plan.  Discussed with  Dr  Verlon Au  Signed by: Wadie Lessen, NP  Knox Royalty, NP  10/08/2014, 10:06 AM  Please contact Palliative Medicine Team phone at [phone] for questions and concerns.   See AMION for contact information

## 2014-10-08 NOTE — H&P (Signed)
[facility] History and Physical  [name] [name] BZJ:[id number] DOB: 1923-07-11 DOA: 10/07/2014  Referring physician: Dr.Floyd. PCP: [name]  Specialists: None.  Chief Complaint: Abdominal pain.  HPI: [name] [name] is a 81 y.o. female who was recently diagnosed with gastric adenocarcinoma with malignant ascites and was considered to have poor prognosis was brought to the ER from the nursing home after patient was complaining of abdominal pain and is having poor appetite. Patient abdomen pain is mostly in the upper quadrants. Patient also was found to have increasing abdominal girth consistent with worsening ascites. In the ER patient was found to be hypotensive. CT chest and abdomen was done which shows worsening ascites. Patient was given fluid boluses. Patient's creatinine has worsened from baseline. On exam patient is not in distress. Patient's daughter is at the bedside. Plan at this time is to keep patient comfortable and no aggressive measures. Patient is a DO NOT RESUSCITATE & is under hospice.   Review of Systems: As presented in the history of presenting illness, rest negative.  Past Medical History  Diagnosis Date  . Hypertension   . Hyperlipidemia   . COPD (chronic obstructive pulmonary disease)   . Osteoporosis   . H/O hiatal hernia   . Chronic back pain   . CAD (coronary artery disease)     LHC 9/05:  pLAD less than 20%, D1 20-30%, ostial RCA 40-50%, proximal RCA 20%, EF 60%.  . C. difficile diarrhea   . Gout   . Atrial fibrillation     a. failed DCCV => b. amiodarone added => converted to NSR on Amiodarone;  c. Xarelto d/c'd 2/2 falling (wrist and hip Fx in 1/14)  . Carotid stenosis     s/p bilat CEA  . C. difficile colitis     10/2011;  11/2011  . Salmonella enteritis 08/2011    c/b septic shock  . Hx of echocardiogram     a. Echocardiogram 09/29/11: Mild LVH, EF 45-50%, diffuse HK, mild to moderate aortic stenosis, mean gradient 12 mmHg, moderate MAC,  mild MR, moderate LAE, moderate RAE, question small secundum ASD with left to right flow not seen in 4 chamber view, PASP 35-39 (mild pulmonary hypertension). ;  b.  TEE on 10/04/11: Mild LVH, EF 55%, mild MR, no defect or PFO  . Pneumonia 11/2101  . PONV (postoperative nausea and vomiting)   . CHF (congestive heart failure)   . Chronic kidney disease   . GERD (gastroesophageal reflux disease)   . Hepatitis   . Anemia   . Hx of echocardiogram     Echo (09/2013): EF 55%, normal wall motion, mild aortic stenosis (mean 12 mm Hg), MAC, mild MR, mild LAE, mild PI, PASP 30 mm Hg  . Hx of cardiovascular stress test     Lexiscan Myoview (7/15):  No ischemia or infarct, EF 50%, Low Risk  . Arthritis     Neck  . PAF (paroxysmal atrial fibrillation)    Past Surgical History  Procedure Laterality Date  . Carotid endarterectomy      bilateral  . Cataract extraction, bilateral    . Joint replacement  2008    Right Total Knee  . Anal fistula repair    . Left parotidectomy    . Eye surgery    . Flexible sigmoidoscopy  09/03/2011    Procedure: FLEXIBLE SIGMOIDOSCOPY;  Surgeon: Beryle Beams, MD;  Location: [facility];  Service: Endoscopy;  Laterality: N/A;  . Tee without cardioversion  10/04/2011  Procedure: TRANSESOPHAGEAL ECHOCARDIOGRAM (TEE);  Surgeon: Larey Dresser, MD;  Location: [place];  Service: Cardiovascular;  Laterality: N/A;  to be carelinked here by 1230-verified 7/29/dl  . Cardioversion  10/04/2011    Procedure: CARDIOVERSION;  Surgeon: Larey Dresser, MD;  Location: [facility];  Service: Cardiovascular;  Laterality: N/A;  . Esophagogastroduodenoscopy  11/29/2011    Procedure: ESOPHAGOGASTRODUODENOSCOPY (EGD);  Surgeon: Cleotis Nipper, MD;  Location: [facility];  Service: Endoscopy;  Laterality: N/A;  recent h/o resp failure/pneumonia  . Left hip fracture repair  03/14/12  . Open reduction internal fixation (orif) scaphoid with distal radius graft  03/14/2012    Procedure:  OPEN REDUCTION INTERNAL FIXATION (ORIF) SCAPHOID WITH DISTAL RADIUS GRAFT;  Surgeon: Jolyn Nap, MD;  Location: [facility];  Service: Orthopedics;  Laterality: Left;  DVR wrist fracture set/ hand innovation  . Hip pinning,cannulated  03/14/2012    Procedure: CANNULATED HIP PINNING;  Surgeon: Jolyn Nap, MD;  Location: [facility];  Service: Orthopedics;  Laterality: Left;  Biomet 6.5 cannulated screws  . Femur im nail Left 04/13/2012    Procedure: INTRAMEDULLARY (IM) NAIL FEMORAL;  Surgeon: Jolyn Nap, MD;  Location: [place];  Service: Orthopedics;  Laterality: Left;  OPEN REDUCTION INTERNAL FIXATION LEFT PROXIMAL FEMUR Fracture   . Esophagogastroduodenoscopy Left 09/29/2014    Procedure: ESOPHAGOGASTRODUODENOSCOPY (EGD);  Surgeon: Laurence Spates, MD;  Location: [facility];  Service: Endoscopy;  Laterality: Left;   Social History:  reports that she quit smoking about 31 years ago. Her smoking use included Cigarettes. She has a 20 pack-year smoking history. She has never used smokeless tobacco. She reports that she does not drink alcohol or use illicit drugs. Where does patient live nursing home. Can patient participate in ADLs? No.  Allergies  Allergen Reactions  . Baby Powder [Methylbenzethonium] Shortness Of Breath  . Demerol [Meperidine] Hives  . Fexofenadine Other (See Comments)    dizziness  . Penicillins Hives    Tolerates Ceftriaxone.  Marland Kitchen Spiriva Handihaler [Tiotropium Bromide Monohydrate]     Caused chest pains    Family History:  Family History  Problem Relation Age of Onset  . Hemochromatosis Sister   . Diabetes Sister   . Hypertension Sister   . Diabetes type II Mother   . Diabetes Mother   . Hypertension Mother   . Deep vein thrombosis Mother   . Hypertension Father   . Hypertension Brother   . Other Brother     Leg amputation-Gun shot  . Depression Brother   . Hemochromatosis Sister   . Heart attack Neg Hx   . Stroke Neg Hx   . Cancer Sister       Prior to  Admission medications   Medication Sig Start Date End Date Taking? Authorizing Provider  amiodarone (PACERONE) 200 MG tablet Take 1 tablet (200 mg total) by mouth daily. 04/07/14  Yes Luke K Kilroy, PA-C  budesonide-formoterol (SYMBICORT) 160-4.5 MCG/ACT inhaler Inhale 2 puffs into the lungs 2 (two) times daily.   Yes Historical Provider, MD  cholecalciferol (VITAMIN D) 1000 UNITS tablet Take 1,000-2,000 Units by mouth 2 (two) times daily. Take 2000 units in the morning and 1000 units in the evening   Yes Historical Provider, MD  clotrimazole (MYCELEX) 10 MG troche Take 1 tablet (10 mg total) by mouth 5 (five) times daily. 10/02/14  Yes Christina P Rama, MD  diltiazem (TIAZAC) 240 MG 24 hr capsule Take 240 mg by mouth daily.   Yes Historical Provider, MD  Docusate Calcium (STOOL SOFTENER PO) Take 1 tablet by mouth as needed (constipation).   Yes Historical Provider, MD  feeding supplement, ENSURE ENLIVE, (ENSURE ENLIVE) LIQD Take 237 mLs by mouth 2 (two) times daily between meals. 10/02/14  Yes Christina P Rama, MD  ferrous sulfate 325 (65 FE) MG tablet Take 325 mg by mouth daily.    Yes Historical Provider, MD  folic acid (FOLVITE) 1 MG tablet Take 1 tablet (1 mg total) by mouth daily. 10/02/14  Yes Christina P Rama, MD  furosemide (LASIX) 20 MG tablet Take 1 tablet (20 mg total) by mouth daily. Patient taking differently: Take 40 mg by mouth daily as needed for fluid or edema.  07/25/14  Yes Burnell Blanks, MD  levalbuterol [facility] HFA) 45 MCG/ACT inhaler Inhale 2 puffs into the lungs every 6 (six) hours as needed for wheezing.    Yes Historical Provider, MD  levalbuterol Penne Lash) 0.63 MG/3ML nebulizer solution Take 0.63 mg by nebulization every 4 (four) hours as needed for wheezing or shortness of breath.   Yes Historical Provider, MD  montelukast (SINGULAIR) 10 MG tablet Take 10 mg by mouth at bedtime.   Yes Historical Provider, MD  Multiple Vitamin (MULTIVITAMIN WITH MINERALS) TABS tablet  Take 1 tablet by mouth daily. 10/02/14  Yes Christina P Rama, MD  omeprazole (PRILOSEC) 40 MG capsule Take 40 mg by mouth every evening.   Yes Historical Provider, MD  ondansetron (ZOFRAN) 4 MG tablet Take 1 tablet (4 mg total) by mouth every 6 (six) hours as needed for nausea. 10/02/14  Yes Christina P Rama, MD  polyethylene glycol (MIRALAX / GLYCOLAX) packet Take 17 g by mouth daily as needed for mild constipation.   Yes Historical Provider, MD  raloxifene (EVISTA) 60 MG tablet Take 60 mg by mouth every morning.    Yes Historical Provider, MD  Saccharomyces boulardii (FLORASTOR PO) Take 1 tablet by mouth 2 (two) times daily.   Yes Historical Provider, MD  thiamine 100 MG tablet Take 1 tablet (100 mg total) by mouth daily. 10/02/14  Yes Venetia Maxon Rama, MD  traMADol (ULTRAM) 50 MG tablet Take 2 tablets (100 mg total) by mouth every 6 (six) hours as needed for moderate pain. Patient taking differently: Take 25 mg by mouth every 6 (six) hours as needed for moderate pain.  10/02/14  Yes Christina P Rama, MD  dexamethasone (DECADRON) 2 MG tablet Take 2 mg by mouth 2 (two) times daily with a meal.    Historical Provider, MD    Physical Exam: Filed Vitals:   10/07/14 2057 10/07/14 2250 10/08/14 0030 10/08/14 0124  BP: 78/59 94/59 83/53  93/50  Pulse: 110 99 104 97  Temp: 99 F (37.2 C)   97.6 F (36.4 C)  TempSrc: Oral   Oral  Resp: 24 20 22 20   SpO2: 95% 98% 97% 97%     General:  Moderately built and poorly nourished.  Eyes: Anicteric no pallor.  ENT: No discharge from the ears eyes nose or mouth.  Neck: No mass felt.  Cardiovascular: S1-S2 heard.  Respiratory: No rhonchi or crepitations.  Abdomen: Distended nontender.  Skin: No rash.  Musculoskeletal: Bilateral lower extremity edema.  Psychiatric: Alert and awake.  Neurologic: Alert awake oriented to name and follows commands.  Labs on Admission:  Basic Metabolic Panel:  Recent Labs Lab 10/01/14 0435 10/02/14 0550  10/07/14 2234  NA 133* 135 133*  K 5.9* 5.2* 5.4*  CL 99* 101 99*  CO2 24 28 25  GLUCOSE 79 115* 91  BUN 57* 60* 79*  CREATININE 2.70* 2.35* 3.79*  CALCIUM 9.7 9.9 8.7*   Liver Function Tests:  Recent Labs Lab 10/07/14 2234  AST 43*  ALT 34  ALKPHOS 59  BILITOT 0.4  PROT 5.4*  ALBUMIN 1.7*    Recent Labs Lab 10/07/14 2234  LIPASE 42   No results for input(s): AMMONIA in the last 168 hours. CBC:  Recent Labs Lab 10/01/14 0435 10/02/14 0550 10/07/14 2234  WBC 10.4 9.9 16.3*  NEUTROABS  --   --  14.9*  HGB 10.5* 10.1* 9.9*  HCT 33.6* 31.8* 31.0*  MCV 90.1 89.1 88.6  PLT 310 319 333   Cardiac Enzymes: No results for input(s): CKTOTAL, CKMB, CKMBINDEX, TROPONINI in the last 168 hours.  BNP (last 3 results)  Recent Labs  02/26/14 1550 09/12/14 1448 09/22/14 1701  BNP 292.3* 360.2* 165.9*    ProBNP (last 3 results) No results for input(s): PROBNP in the last 8760 hours.  CBG:  Recent Labs Lab 10/01/14 0737 10/01/14 0816 10/01/14 0957  GLUCAP 66 68 74    Radiological Exams on Admission: Ct Abdomen Pelvis Wo Contrast  10/07/2014   CLINICAL DATA:  Increasing pain and pressure in the abdomen over the past few days. Paracentesis 2 weeks ago.  EXAM: CT CHEST, ABDOMEN AND PELVIS WITHOUT CONTRAST  TECHNIQUE: Multidetector CT imaging of the chest, abdomen and pelvis was performed following the standard protocol without IV contrast.  COMPARISON:  09/12/2014  FINDINGS: CT CHEST FINDINGS  No mediastinal or hilar adenopathy is evident, with mild study limitations due to the lack of intravenous contrast. Axillary regions appear unremarkable. No pulmonary metastases are evident. There are mild parenchymal lung opacities in the posterior lower lobes, adjacent to the small pleural effusions. This may be atelectatic.  CT ABDOMEN AND PELVIS FINDINGS  There is a moderately large volume peritoneal ascites, similar to the CT of 09/12/2014. There are unremarkable unenhanced  appearances of the liver, with no evidence of hepatic metastasis. The spleen, pancreas, adrenals and kidneys appear unremarkable. Stomach is mildly distended, with mural thickening of the antral region. Small bowel is unremarkable. There is stranding opacity and a suggestion of nodularity in the omentum, suspicious for neoplastic involvement. The uterus and ovaries appear unremarkable.  No significant skeletal lesions are evident. Moderate kyphosis and moderate thoracolumbar degenerative disc changes are present. There is a grade 1 spondylolisthesis at L4-5 which appears degenerative. There also is minimal degenerative appearing retrolisthesis at L5-S1.  IMPRESSION: Moderately large volume peritoneal ascites. Suspicious stranding opacities in the omentum, likely neoplastic. Mural thickening in the gastric antrum again noted.  Small pleural effusions with adjacent atelectatic appearing posterior lower lobe alveolar opacities.  No pulmonary or hepatic metastases are evident. No significant skeletal lesions are evident.   Electronically Signed   By: Andreas Newport M.D.   On: 10/07/2014 23:38   Ct Chest Wo Contrast  10/07/2014   CLINICAL DATA:  Increasing pain and pressure in the abdomen over the past few days. Paracentesis 2 weeks ago.  EXAM: CT CHEST, ABDOMEN AND PELVIS WITHOUT CONTRAST  TECHNIQUE: Multidetector CT imaging of the chest, abdomen and pelvis was performed following the standard protocol without IV contrast.  COMPARISON:  09/12/2014  FINDINGS: CT CHEST FINDINGS  No mediastinal or hilar adenopathy is evident, with mild study limitations due to the lack of intravenous contrast. Axillary regions appear unremarkable. No pulmonary metastases are evident. There are mild parenchymal lung opacities in the posterior lower lobes, adjacent  to the small pleural effusions. This may be atelectatic.  CT ABDOMEN AND PELVIS FINDINGS  There is a moderately large volume peritoneal ascites, similar to the CT of  09/12/2014. There are unremarkable unenhanced appearances of the liver, with no evidence of hepatic metastasis. The spleen, pancreas, adrenals and kidneys appear unremarkable. Stomach is mildly distended, with mural thickening of the antral region. Small bowel is unremarkable. There is stranding opacity and a suggestion of nodularity in the omentum, suspicious for neoplastic involvement. The uterus and ovaries appear unremarkable.  No significant skeletal lesions are evident. Moderate kyphosis and moderate thoracolumbar degenerative disc changes are present. There is a grade 1 spondylolisthesis at L4-5 which appears degenerative. There also is minimal degenerative appearing retrolisthesis at L5-S1.  IMPRESSION: Moderately large volume peritoneal ascites. Suspicious stranding opacities in the omentum, likely neoplastic. Mural thickening in the gastric antrum again noted.  Small pleural effusions with adjacent atelectatic appearing posterior lower lobe alveolar opacities.  No pulmonary or hepatic metastases are evident. No significant skeletal lesions are evident.   Electronically Signed   By: Andreas Newport M.D.   On: 10/07/2014 23:38   Dg Chest Port 1 View  10/07/2014   CLINICAL DATA:  Acute onset of generalized abdominal pain. Initial encounter.  EXAM: PORTABLE CHEST - 1 VIEW  COMPARISON:  Chest radiograph performed 09/23/2014  FINDINGS: The lungs are mildly hypoexpanded. Mild vascular congestion is noted. Mild bibasilar atelectasis or scarring is seen. There is no evidence of pleural effusion or pneumothorax.  The cardiomediastinal silhouette is mildly enlarged. No acute osseous abnormalities are seen. Clips are seen overlying the right side of the neck.  IMPRESSION: Mild vascular congestion and mild cardiomegaly noted. Mild bibasilar atelectasis or scarring seen. Lungs mildly hypoexpanded.   Electronically Signed   By: Garald Balding M.D.   On: 10/07/2014 21:38     Assessment/Plan Principal Problem:    Failure to thrive in adult Active Problems:   Atrial fibrillation   Chronic combined systolic and diastolic heart failure   Ascites   Abdominal pain   Acute renal failure   Abdominal pain, right upper quadrant   1. Failure to thrive in a patient with recent diagnosis of gastric adenocarcinoma with malignant ascites. 2. Acute renal failure probably from dehydration. 3. Hypotension probably from dehydration. 4. History of chronic systolic heart failure last EF measured was 30-35%. 5. Chronic atrial fibrillation presently not on anticoagulation in anticipation of possible procedures. 6. COPD presently not wheezing. 7. History of gout.  Plan - at this time after discussing the patient's daughter the main goal is to keep patient comfortable and no aggressive measures. For which I have not ordered any further labs. Patient has been empirically placed on ceftriaxone for possible SBP. If patient remains stable then tried to order paracentesis in a.m. Original plan was to have dominant paracentesis drain placed by radiologist this week. I have consulted hospice. We'll try to continue home medications if patient can take orally. Hold off antihypertensives because patient's blood pressure was in the lower side.   DVT Prophylaxis Lovenox.  Code Status: DO NOT RESUSCITATE.  Family Communication: Discussed daughter.  Disposition Plan: Admit to inpatient.    Tunisha Ruland N. [facility] Pager [phone].  If 7PM-7AM, please contact night-coverage [email or web address] Password [facility] 10/08/2014, 2:37 AM

## 2014-10-08 NOTE — Progress Notes (Signed)
NUTRITION NOTE  Nutrition Brief Note  Patient identified on the Malnutrition Screening Tool (MST) Report  Wt Readings from Last 15 Encounters:  10/08/14 157 lb 14.4 oz (71.623 kg)  10/06/14 157 lb 9.6 oz (71.487 kg)  10/02/14 155 lb (70.308 kg)  06/24/14 171 lb 15.3 oz (78 kg)  06/06/14 175 lb (79.379 kg)  04/28/14 172 lb (78.019 kg)  04/14/14 180 lb (81.647 kg)  03/31/14 176 lb (79.833 kg)  03/04/14 178 lb (80.74 kg)  02/28/14 180 lb (81.647 kg)  11/15/13 173 lb (78.472 kg)  09/26/13 172 lb (78.019 kg)  09/12/13 173 lb (78.472 kg)  11/12/12 161 lb (73.029 kg)  08/30/12 152 lb (68.947 kg)    Body mass index is 29.85 kg/(m^2). Patient meets criteria for overweight based on current BMI.   Positive MST. Stage 2 sacral ulcer noted. Pt was admitted from [facility] and per CSW note, pt's daughter wishes pt to d/c home with hospice services. Did not see pt for full assessment given POC at this time. Noted that Ensure Enlive has been ordered BID; will order Boost Breeze TID for pt on CLD should she wish to drink this supplement.   Current diet order is CLD with no intakes recorded since admission. Labs and medications reviewed.   No nutrition interventions warranted at this time. If nutrition issues arise, please consult RD.      Jarome Matin, RD, LDN Inpatient Clinical Dietitian Pager # [phone] After hours/weekend pager # [phone]

## 2014-10-08 NOTE — Consult Note (Signed)
Chief Complaint: Patient was seen in consultation today for tunneled peritoneal catheter placement Chief Complaint  Patient presents with  . Abdominal Pain    Abd pain x 1day - hx of stomach CA.       Referring Physician(s): [facility]  History of Present Illness: [name] [name] is a 81 y.o. female with history of recently diagnosed gastric adenocarcinoma and recurrent malignant ascites who was initially scheduled for placement of a tunneled peritoneal catheter as an outpatient on 10/09/14. Patient was transported to the ER from the nursing home on 10/08/14 secondary to abdominal pain and diminished appetite. In the ED patient was noted to be hypotensive and with worsening creatinine. In the interim she has received fluid boluses. She continues to complain of mild dyspnea as well as abdominal pain, nausea/ vomiting and weakness. Patient is currently under hospice care . Request has been received again from [facility]/palliative care for placement of tunneled peritoneal catheter.  Past Medical History  Diagnosis Date  . Hypertension   . Hyperlipidemia   . COPD (chronic obstructive pulmonary disease)   . Osteoporosis   . H/O hiatal hernia   . Chronic back pain   . CAD (coronary artery disease)     LHC 9/05:  pLAD less than 20%, D1 20-30%, ostial RCA 40-50%, proximal RCA 20%, EF 60%.  . C. difficile diarrhea   . Gout   . Atrial fibrillation     a. failed DCCV => b. amiodarone added => converted to NSR on Amiodarone;  c. Xarelto d/c'd 2/2 falling (wrist and hip Fx in 1/14)  . Carotid stenosis     s/p bilat CEA  . C. difficile colitis     10/2011;  11/2011  . Salmonella enteritis 08/2011    c/b septic shock  . Hx of echocardiogram     a. Echocardiogram 09/29/11: Mild LVH, EF 45-50%, diffuse HK, mild to moderate aortic stenosis, mean gradient 12 mmHg, moderate MAC, mild MR, moderate LAE, moderate RAE, question small secundum ASD with left to right flow not seen in 4 chamber view, PASP 35-39 (mild  pulmonary hypertension). ;  b.  TEE on 10/04/11: Mild LVH, EF 55%, mild MR, no defect or PFO  . Pneumonia 11/2101  . PONV (postoperative nausea and vomiting)   . CHF (congestive heart failure)   . Chronic kidney disease   . GERD (gastroesophageal reflux disease)   . Hepatitis   . Anemia   . Hx of echocardiogram     Echo (09/2013): EF 55%, normal wall motion, mild aortic stenosis (mean 12 mm Hg), MAC, mild MR, mild LAE, mild PI, PASP 30 mm Hg  . Hx of cardiovascular stress test     Lexiscan Myoview (7/15):  No ischemia or infarct, EF 50%, Low Risk  . Arthritis     Neck  . PAF (paroxysmal atrial fibrillation)     Past Surgical History  Procedure Laterality Date  . Carotid endarterectomy      bilateral  . Cataract extraction, bilateral    . Joint replacement  2008    Right Total Knee  . Anal fistula repair    . Left parotidectomy    . Eye surgery    . Flexible sigmoidoscopy  09/03/2011    Procedure: FLEXIBLE SIGMOIDOSCOPY;  Surgeon: Beryle Beams, MD;  Location: [facility] ENDOSCOPY;  Service: Endoscopy;  Laterality: N/A;  . Tee without cardioversion  10/04/2011    Procedure: TRANSESOPHAGEAL ECHOCARDIOGRAM (TEE);  Surgeon: Larey Dresser, MD;  Location: [facility]  ENDOSCOPY;  Service: Cardiovascular;  Laterality: N/A;  to be carelinked here by 1230-verified 7/29/dl  . Cardioversion  10/04/2011    Procedure: CARDIOVERSION;  Surgeon: Larey Dresser, MD;  Location: [facility] ENDOSCOPY;  Service: Cardiovascular;  Laterality: N/A;  . Esophagogastroduodenoscopy  11/29/2011    Procedure: ESOPHAGOGASTRODUODENOSCOPY (EGD);  Surgeon: Cleotis Nipper, MD;  Location: [facility] ENDOS[facility];  Service: Endoscopy;  Laterality: N/A;  recent h/o resp failure/pneumonia  . Left hip fracture repair  03/14/12  . Open reduction internal fixation (orif) scaphoid with distal radius graft  03/14/2012    Procedure: OPEN REDUCTION INTERNAL FIXATION (ORIF) SCAPHOID WITH DISTAL RADIUS GRAFT;  Surgeon: Jolyn Nap, MD;  Location: [facility] ORS;   Service: Orthopedics;  Laterality: Left;  DVR wrist fracture set/ hand innovation  . Hip pinning,cannulated  03/14/2012    Procedure: CANNULATED HIP PINNING;  Surgeon: Jolyn Nap, MD;  Location: [facility] ORS;  Service: Orthopedics;  Laterality: Left;  Biomet 6.5 cannulated screws  . Femur im nail Left 04/13/2012    Procedure: INTRAMEDULLARY (IM) NAIL FEMORAL;  Surgeon: Jolyn Nap, MD;  Location: [place];  Service: Orthopedics;  Laterality: Left;  OPEN REDUCTION INTERNAL FIXATION LEFT PROXIMAL FEMUR Fracture   . Esophagogastroduodenoscopy Left 09/29/2014    Procedure: ESOPHAGOGASTRODUODENOSCOPY (EGD);  Surgeon: Laurence Spates, MD;  Location: [facility] ENDOSCOPY;  Service: Endoscopy;  Laterality: Left;    Allergies: Baby powder; Demerol; Fexofenadine; Penicillins; and Spiriva handihaler  Medications: Prior to Admission medications   Medication Sig Start Date End Date Taking? Authorizing Provider  amiodarone (PACERONE) 200 MG tablet Take 1 tablet (200 mg total) by mouth daily. 04/07/14  Yes Luke K Kilroy, PA-C  budesonide-formoterol (SYMBICORT) 160-4.5 MCG/ACT inhaler Inhale 2 puffs into the lungs 2 (two) times daily.   Yes Historical Provider, MD  cholecalciferol (VITAMIN D) 1000 UNITS tablet Take 1,000-2,000 Units by mouth 2 (two) times daily. Take 2000 units in the morning and 1000 units in the evening   Yes Historical Provider, MD  clotrimazole (MYCELEX) 10 MG troche Take 1 tablet (10 mg total) by mouth 5 (five) times daily. 10/02/14  Yes Christina P Rama, MD  diltiazem (TIAZAC) 240 MG 24 hr capsule Take 240 mg by mouth daily.   Yes Historical Provider, MD  Docusate Calcium (STOOL SOFTENER PO) Take 1 tablet by mouth as needed (constipation).   Yes Historical Provider, MD  feeding supplement, ENSURE ENLIVE, (ENSURE ENLIVE) LIQD Take 237 mLs by mouth 2 (two) times daily between meals. 10/02/14  Yes Christina P Rama, MD  ferrous sulfate 325 (65 FE) MG tablet Take 325 mg by mouth daily.    Yes Historical  Provider, MD  folic acid (FOLVITE) 1 MG tablet Take 1 tablet (1 mg total) by mouth daily. 10/02/14  Yes Christina P Rama, MD  furosemide (LASIX) 20 MG tablet Take 1 tablet (20 mg total) by mouth daily. Patient taking differently: Take 40 mg by mouth daily as needed for fluid or edema.  07/25/14  Yes Burnell Blanks, MD  levalbuterol [facility] HFA) 45 MCG/ACT inhaler Inhale 2 puffs into the lungs every 6 (six) hours as needed for wheezing.    Yes Historical Provider, MD  levalbuterol Penne Lash) 0.63 MG/3ML nebulizer solution Take 0.63 mg by nebulization every 4 (four) hours as needed for wheezing or shortness of breath.   Yes Historical Provider, MD  montelukast (SINGULAIR) 10 MG tablet Take 10 mg by mouth at bedtime.   Yes Historical Provider, MD  Multiple Vitamin (MULTIVITAMIN WITH MINERALS) TABS tablet  Take 1 tablet by mouth daily. 10/02/14  Yes Christina P Rama, MD  omeprazole (PRILOSEC) 40 MG capsule Take 40 mg by mouth every evening.   Yes Historical Provider, MD  ondansetron (ZOFRAN) 4 MG tablet Take 1 tablet (4 mg total) by mouth every 6 (six) hours as needed for nausea. 10/02/14  Yes Christina P Rama, MD  polyethylene glycol (MIRALAX / GLYCOLAX) packet Take 17 g by mouth daily as needed for mild constipation.   Yes Historical Provider, MD  raloxifene (EVISTA) 60 MG tablet Take 60 mg by mouth every morning.    Yes Historical Provider, MD  Saccharomyces boulardii (FLORASTOR PO) Take 1 tablet by mouth 2 (two) times daily.   Yes Historical Provider, MD  thiamine 100 MG tablet Take 1 tablet (100 mg total) by mouth daily. 10/02/14  Yes Venetia Maxon Rama, MD  traMADol (ULTRAM) 50 MG tablet Take 2 tablets (100 mg total) by mouth every 6 (six) hours as needed for moderate pain. Patient taking differently: Take 25 mg by mouth every 6 (six) hours as needed for moderate pain.  10/02/14  Yes Christina P Rama, MD  dexamethasone (DECADRON) 2 MG tablet Take 2 mg by mouth 2 (two) times daily with a meal.     Historical Provider, MD     Family History  Problem Relation Age of Onset  . Hemochromatosis Sister   . Diabetes Sister   . Hypertension Sister   . Diabetes type II Mother   . Diabetes Mother   . Hypertension Mother   . Deep vein thrombosis Mother   . Hypertension Father   . Hypertension Brother   . Other Brother     Leg amputation-Gun shot  . Depression Brother   . Hemochromatosis Sister   . Heart attack Neg Hx   . Stroke Neg Hx   . Cancer Sister     History   Social History  . Marital Status: Widowed    Spouse Name: N/A  . Number of Children: N/A  . Years of Education: N/A   Social History Main Topics  . Smoking status: Former Smoker -- 1.00 packs/day for 20 years    Types: Cigarettes    Quit date: 09/01/1983  . Smokeless tobacco: Never Used  . Alcohol Use: No  . Drug Use: No  . Sexual Activity: No   Other Topics Concern  . None   Social History Narrative      Review of Systems see above  Vital Signs: BP 84/48 mmHg  Pulse 104  Temp(Src) 97.4 F (36.3 C) (Oral)  Resp 20  Ht 5\' 1"  (1.549 m)  Wt 157 lb 14.4 oz (71.623 kg)  BMI 29.85 kg/m2  SpO2 98%  LMP  (LMP Unknown)  Physical Exam patient awake, alert; chest-diminished breath sounds at the bases bilaterally; heart irregularly irregular; abdomen distended, positive bowel sounds, diffuse tenderness to palpation; lower extremities with 3+ edema bilaterally  Mallampati Score:     Imaging: Ct Abdomen Pelvis Wo Contrast  10/07/2014   CLINICAL DATA:  Increasing pain and pressure in the abdomen over the past few days. Paracentesis 2 weeks ago.  EXAM: CT CHEST, ABDOMEN AND PELVIS WITHOUT CONTRAST  TECHNIQUE: Multidetector CT imaging of the chest, abdomen and pelvis was performed following the standard protocol without IV contrast.  COMPARISON:  09/12/2014  FINDINGS: CT CHEST FINDINGS  No mediastinal or hilar adenopathy is evident, with mild study limitations due to the lack of intravenous contrast.  Axillary regions appear unremarkable. No pulmonary metastases  are evident. There are mild parenchymal lung opacities in the posterior lower lobes, adjacent to the small pleural effusions. This may be atelectatic.  CT ABDOMEN AND PELVIS FINDINGS  There is a moderately large volume peritoneal ascites, similar to the CT of 09/12/2014. There are unremarkable unenhanced appearances of the liver, with no evidence of hepatic metastasis. The spleen, pancreas, adrenals and kidneys appear unremarkable. Stomach is mildly distended, with mural thickening of the antral region. Small bowel is unremarkable. There is stranding opacity and a suggestion of nodularity in the omentum, suspicious for neoplastic involvement. The uterus and ovaries appear unremarkable.  No significant skeletal lesions are evident. Moderate kyphosis and moderate thoracolumbar degenerative disc changes are present. There is a grade 1 spondylolisthesis at L4-5 which appears degenerative. There also is minimal degenerative appearing retrolisthesis at L5-S1.  IMPRESSION: Moderately large volume peritoneal ascites. Suspicious stranding opacities in the omentum, likely neoplastic. Mural thickening in the gastric antrum again noted.  Small pleural effusions with adjacent atelectatic appearing posterior lower lobe alveolar opacities.  No pulmonary or hepatic metastases are evident. No significant skeletal lesions are evident.   Electronically Signed   By: Andreas Newport M.D.   On: 10/07/2014 23:38   Ct Abdomen Pelvis Wo Contrast  09/12/2014   CLINICAL DATA:  81 year old female with abdominal pain and distention.  EXAM: CT ABDOMEN AND PELVIS WITHOUT CONTRAST  TECHNIQUE: Multidetector CT imaging of the abdomen and pelvis was performed following the standard protocol without IV contrast.  COMPARISON:  11/03/2011 CT  FINDINGS: Please note that parenchymal abnormalities may be missed without intravenous contrast.  Lower chest: Cardiomegaly, tiny bilateral pleural  effusions and bibasilar atelectasis identified.  Hepatobiliary: The liver and gallbladder are unremarkable. There is no evidence of biliary dilatation.  Pancreas: Unremarkable  Spleen: Unremarkable  Adrenals/Urinary Tract: Bilateral renal atrophy identified. The adrenal glands and bladder are unremarkable.  Stomach/Bowel: There is possible circumferential wall thickening of the distal stomach. Colonic diverticulosis is identified without evidence of diverticulitis. No other focal bowel wall thickening is identified.  Vascular/Lymphatic: Abdominal aortic atherosclerotic calcifications are noted. There is no evidence of abdominal aortic aneurysm. No enlarged lymph nodes are noted.  Reproductive: The uterus and adnexal regions are unremarkable.  Other: A large amount of ascites is noted. Stranding in the omentum is present. No gross peritoneal nodules or masses are identified. There is no evidence of pneumoperitoneum.  Musculoskeletal: Surgical hardware within the proximal left femur is noted. Moderate -severe degenerative changes in the lumbar spine are identified. Grade 1 anterolisthesis of L4 on L5 is unchanged.  IMPRESSION: Large amount of ascites without identifiable cause. Omental stranding may represent edema but tumor involvement of the omentum is not entirely excluded. No adnexal masses identified.  Possible distal stomach wall thickening. Consider direct inspection as clinically indicated.  Bilateral renal atrophy.  Cardiomegaly, tiny bilateral pleural effusions and mild bibasilar atelectasis.   Electronically Signed   By: Margarette Canada M.D.   On: 09/12/2014 18:34   Ct Chest Wo Contrast  10/07/2014   CLINICAL DATA:  Increasing pain and pressure in the abdomen over the past few days. Paracentesis 2 weeks ago.  EXAM: CT CHEST, ABDOMEN AND PELVIS WITHOUT CONTRAST  TECHNIQUE: Multidetector CT imaging of the chest, abdomen and pelvis was performed following the standard protocol without IV contrast.  COMPARISON:   09/12/2014  FINDINGS: CT CHEST FINDINGS  No mediastinal or hilar adenopathy is evident, with mild study limitations due to the lack of intravenous contrast. Axillary regions appear unremarkable. No  pulmonary metastases are evident. There are mild parenchymal lung opacities in the posterior lower lobes, adjacent to the small pleural effusions. This may be atelectatic.  CT ABDOMEN AND PELVIS FINDINGS  There is a moderately large volume peritoneal ascites, similar to the CT of 09/12/2014. There are unremarkable unenhanced appearances of the liver, with no evidence of hepatic metastasis. The spleen, pancreas, adrenals and kidneys appear unremarkable. Stomach is mildly distended, with mural thickening of the antral region. Small bowel is unremarkable. There is stranding opacity and a suggestion of nodularity in the omentum, suspicious for neoplastic involvement. The uterus and ovaries appear unremarkable.  No significant skeletal lesions are evident. Moderate kyphosis and moderate thoracolumbar degenerative disc changes are present. There is a grade 1 spondylolisthesis at L4-5 which appears degenerative. There also is minimal degenerative appearing retrolisthesis at L5-S1.  IMPRESSION: Moderately large volume peritoneal ascites. Suspicious stranding opacities in the omentum, likely neoplastic. Mural thickening in the gastric antrum again noted.  Small pleural effusions with adjacent atelectatic appearing posterior lower lobe alveolar opacities.  No pulmonary or hepatic metastases are evident. No significant skeletal lesions are evident.   Electronically Signed   By: Andreas Newport M.D.   On: 10/07/2014 23:38   US Renal  10/02/2014   CLINICAL DATA:  Acute renal insufficiency  EXAM: RENAL / URINARY TRACT ULTRASOUND COMPLETE  COMPARISON:  None.  FINDINGS: Right Kidney:  Length: 9.7 cm. Echogenicity within normal limits. No mass or hydronephrosis visualized.  Left Kidney:  Length: 9.7 cm. Echogenicity within normal  limits. No mass or hydronephrosis visualized.  Bladder:  Appears normal for degree of bladder distention.  Moderate volume ascites noted.  IMPRESSION: Negative for hydronephrosis.  Ascites noted.   Electronically Signed   By: Andreas Newport M.D.   On: 10/02/2014 03:12   US Paracentesis  10/01/2014   INDICATION: Ascites, request for paracentesis.  EXAM: ULTRASOUND-GUIDED PARACENTESIS  COMPARISON:  Paracentesis 09/23/14.  MEDICATIONS: None.  COMPLICATIONS: None immediate  TECHNIQUE: Informed written consent was obtained from the patient after a discussion of the risks, benefits and alternatives to treatment. A timeout was performed prior to the initiation of the procedure.  Initial ultrasound scanning demonstrates a large amount of ascites within the right lower abdominal quadrant. The right lower abdomen was prepped and draped in the usual sterile fashion. 1% lidocaine was used for local anesthesia.  Under direct ultrasound guidance, a 19 gauge, 7-cm, Yueh catheter was introduced. An ultrasound image was saved for documentation purposed. The paracentesis was performed. The catheter was removed and a dressing was applied. The patient tolerated the procedure well without immediate post procedural complication.  FINDINGS: A total of approximately 4 liters of serous fluid was removed.  IMPRESSION: Successful ultrasound-guided paracentesis yielding 4 liters of peritoneal fluid.  Read By:  Tsosie Billing PA-C   Electronically Signed   By: Aletta Edouard M.D.   On: 10/01/2014 13:38   US Paracentesis  09/23/2014   INDICATION: Recurrent ascites, request for diagnostic and therapeutic paracentesis.  EXAM: ULTRASOUND-GUIDED PARACENTESIS  COMPARISON:  Paracentesis 09/18/14.  MEDICATIONS: None.  COMPLICATIONS: None immediate  TECHNIQUE: Informed written consent was obtained from the patient after a discussion of the risks, benefits and alternatives to treatment. A timeout was performed prior to the initiation of the  procedure.  Initial ultrasound scanning demonstrates a moderate amount of ascites within the right lower abdominal quadrant. The right lower abdomen was prepped and draped in the usual sterile fashion. 1% lidocaine was used for local anesthesia.  Under  direct ultrasound guidance, a 19 gauge, 10-cm, Yueh catheter was introduced. An ultrasound image was saved for documentation purposed. The paracentesis was performed. The catheter was removed and a dressing was applied. The patient tolerated the procedure well without immediate post procedural complication.  FINDINGS: A total of approximately 3.1 liters of serous fluid was removed. Samples were sent to the laboratory as requested by the clinical team.  IMPRESSION: Successful ultrasound-guided paracentesis yielding 3.1 liters of peritoneal fluid.  Read By:  Tsosie Billing PA-C   Electronically Signed   By: Corrie Mckusick D.O.   On: 09/23/2014 15:33   US Paracentesis  09/18/2014   INDICATION: Intra-abdominal ascites of uncertain etiology. Please perform ultrasound-guided paracentesis for diagnostic and therapeutic purposes.  EXAM: ULTRASOUND-GUIDED PARACENTESIS  COMPARISON:  None.  MEDICATIONS: 10 cc 1% lidocaine  COMPLICATIONS: None immediate  TECHNIQUE: Informed written consent was obtained from the patient after a discussion of the risks, benefits and alternatives to treatment. A timeout was performed prior to the initiation of the procedure.  Initial ultrasound scanning demonstrates a large amount of ascites within the right lower abdominal quadrant. The right lower abdomen was prepped and draped in the usual sterile fashion. 1% lidocaine with epinephrine was used for local anesthesia. Under direct ultrasound guidance, a 19 gauge, 7-cm, Yueh catheter was introduced. An ultrasound image was saved for documentation purposed. The paracentesis was performed. The catheter was removed and a dressing was applied. The patient tolerated the procedure well without immediate  post procedural complication.  FINDINGS: A total of approximately 3.8 liters of yellow fluid was removed. Samples were sent to the laboratory as requested by the clinical team.  IMPRESSION: Successful ultrasound-guided paracentesis yielding 3.8 liters of peritoneal fluid.  50 gr IV albumin during procedure per MD.  Read by:  Lavonia Drafts [facility]   Electronically Signed   By: Sandi Mariscal M.D.   On: 09/18/2014 15:46   Dg Chest Port 1 View  10/07/2014   CLINICAL DATA:  Acute onset of generalized abdominal pain. Initial encounter.  EXAM: PORTABLE CHEST - 1 VIEW  COMPARISON:  Chest radiograph performed 09/23/2014  FINDINGS: The lungs are mildly hypoexpanded. Mild vascular congestion is noted. Mild bibasilar atelectasis or scarring is seen. There is no evidence of pleural effusion or pneumothorax.  The cardiomediastinal silhouette is mildly enlarged. No acute osseous abnormalities are seen. Clips are seen overlying the right side of the neck.  IMPRESSION: Mild vascular congestion and mild cardiomegaly noted. Mild bibasilar atelectasis or scarring seen. Lungs mildly hypoexpanded.   Electronically Signed   By: Garald Balding M.D.   On: 10/07/2014 21:38   Dg Chest Port 1 View  09/23/2014   CLINICAL DATA:  Shortness of breath.  Chest congestion.  EXAM: PORTABLE CHEST - 1 VIEW  COMPARISON:  Chest x-rays dated 06/21/2014, 02/26/2014, 07/24/2013 and 07/06/2013 and 04/12/2012  FINDINGS: There is cardiomegaly with new pulmonary vascular congestion and very slight accentuation of the interstitial markings. No discrete effusions. No acute osseous abnormality.  IMPRESSION: Pulmonary vascular congestion with minimal interstitial edema.   Electronically Signed   By: Lorriane Shire M.D.   On: 09/23/2014 14:30   Dg Abd Portable 1v  09/23/2014   CLINICAL DATA:  Abdominal distention and pain.  Ascites.  EXAM: PORTABLE ABDOMEN - 1 VIEW  COMPARISON:  CT scan dated 09/12/2014  FINDINGS: Bowel gas pattern is normal. Residual contrast in  the appendix from the recent CT scan. Diffuse degenerative changes and scoliosis in the lumbar spine. Chronic degenerative  or posttraumatic changes of the right pubic body. No acute osseous abnormality. Healed left hip fracture.  IMPRESSION: No acute abnormality of the abdomen.   Electronically Signed   By: Lorriane Shire M.D.   On: 09/23/2014 14:43    Labs:  CBC:  Recent Labs  10/01/14 0435 10/02/14 0550 10/07/14 2234 10/08/14 0318  WBC 10.4 9.9 16.3* 15.4*  HGB 10.5* 10.1* 9.9* 9.9*  HCT 33.6* 31.8* 31.0* 31.5*  PLT 310 319 333 313    COAGS:  Recent Labs  09/23/14 0441  09/26/14 0335 09/26/14 1405 09/26/14 2156 09/27/14 0502  INR 1.36  --   --   --   --   --   APTT 36  < > 65* 46* 55* 72*  < > = values in this interval not displayed.  BMP:  Recent Labs  09/30/14 0438 10/01/14 0435 10/02/14 0550 10/07/14 2234 10/08/14 0318  NA 134* 133* 135 133*  --   K 5.2* 5.9* 5.2* 5.4*  --   CL 100* 99* 101 99*  --   CO2 24 24 28 25   --   GLUCOSE 78 79 115* 91  --   BUN 48* 57* 60* 79*  --   CALCIUM 9.4 9.7 9.9 8.7*  --   CREATININE 2.29* 2.70* 2.35* 3.79* 3.64*  GFRNONAA 18* 15* 17* 10* 10*  GFRAA 21* 17* 20* 11* 12*    LIVER FUNCTION TESTS:  Recent Labs  09/23/14 0441 09/29/14 0204 09/30/14 0438 10/07/14 2234  BILITOT 0.4 0.3 0.6 0.4  AST 20 21 24  43*  ALT 14 15 19  34  ALKPHOS 70 51 50 59  PROT 5.4* 5.1* 5.3* 5.4*  ALBUMIN 2.3* 1.8* 1.7* 1.7*    TUMOR MARKERS:  Recent Labs  09/24/14 0323  CEA 5.5*  CA199 8    Assessment and Plan: Patient with history of recently diagnosed gastric adenocarcinoma with recurrent symptomatic malignant ascites,  failure to thrive, currently on hospice care. Request made for tunneled peritoneal catheter placement. Patient currently afebrile but hypotensive. Latest  WBC 15.4, hemoglobin 9.9, platelets 313 K. Patient currently receiving Decadron, Lovenox and Rocephin. Details/risks of procedure, including but not limited  to, internal bleeding, infection/sepsis, inability to adequately drain fluid, discussed with patient and daughter with their understanding and consent. Procedure tent scheduled for 8/4.   Thank you for this interesting consult.  I greatly enjoyed meeting Letisia Basinski and look forward to participating in their care.  A copy of this report was sent to the requesting provider on this date.  Signed: D. Rowe Robert 10/08/2014, 3:33 PM   I spent a total of  20 minutes in face to face in clinical consultation, greater than 50% of which was counseling/coordinating care for tunneled peritoneal catheter placement

## 2014-10-08 NOTE — Clinical Social Work Note (Signed)
Clinical Social Work Assessment  Patient Details  Name: [name] [name] MRN: [id number] Date of Birth: 1923/09/25  Date of referral:  10/08/14               Reason for consult:  Discharge Planning                Permission sought to share information with:  Family Supports Permission granted to share information::  Yes, Verbal Permission Granted  Name::     [name]  Agency::     Relationship::  daughter  Contact Information:  [phone]  Housing/Transportation Living arrangements for the past 2 months:  [place] of Information:  Adult Children Patient Interpreter Needed:  None Criminal Activity/Legal Involvement Pertinent to Current Situation/Hospitalization:  No - Comment as needed Significant Relationships:  Adult Children Lives with:  Facility Resident Do you feel safe going back to the place where you live?  No Need for family participation in patient care:  Yes (Comment)  Care giving concerns:  CSW received referral that pt admitted from [facility] and palliative care has been consulted to address goals of care and disposition.   Social Worker assessment / plan:  CSW received referral that pt admitted from [facility].   CSW received notification from Palliative Medicine NP, Wadie Lessen that meeting was held this morning with pt and pt daughter and pt and pt daughter do not want pt to return to [facility] and would like for pt to discharge home to pt daughters home with hospice services.  CSW met with pt and pt daughter at bedside. Pt sleeping during time of visit. CSW familiar with pt and pt daughter from previous hospitalization. Pt daughter confirmed wishes for pt to discharge home with pt daughter and hospice services. CSW provided support as pt daughter discussed that pt experience at [facility] was not pleasant and pt daughter was surprised given that pt had been to [facility] in the past and had a positive experience the first time. Pt  daughter feels that pt needs will be better met at home with pt daughter and hospice services to follow. CSW notified pt daughter that RNCM will follow up with pt daughter to offer choice for hospice agencies and assist in arranging any equipment for the home. Pt daughter appreciative.   Pt daughter reports that she anticipates that pt will need ambulance transport to home upon discharge. CSW will continue to follow in order to arrange ambulance transport for when pt medically ready.   CSW to continue to follow to provide support and assist with ambulance transport to home with hospice when medically stable for discharge.  Employment status:  Retired Nurse, adult PT Recommendations:  Not assessed at this time Information / Referral to community resources:  Other (Comment Required) (Referral to [facility] for Home Hospice needs)  Patient/Family's Response to care:  Per chart, pt alert and oriented x 4, but sleeping during time of visit. Pt daughter supportive and actively involved in pt care. Pt daughter feels that pt needs will be best met at home with hospice care and pt daughter can assist with pt care at home.  Patient/Family's Understanding of and Emotional Response to Diagnosis, Current Treatment, and Prognosis:  Pt daughter displays understanding surrounding pt diagnosis and pt daughter main goal is for pt to be comfortable as pt daughter recognizes pt poor prognosis appropriateness to involve hospice at this time.   Emotional Assessment Appearance:  Appears stated age Attitude/Demeanor/Rapport:  Other (pt appropriate and sleeping during assessment) Affect (typically observed):  Other (pt sleeping during visit) Orientation:  Oriented to Self, Oriented to Place, Oriented to  Time, Oriented to Situation Alcohol / Substance use:  Not Applicable Psych involvement (Current and /or in the community):  No (Comment)  Discharge Needs  Concerns to be addressed:  Discharge  Planning Concerns Readmission within the last 30 days:  Yes Current discharge risk:  None Barriers to Discharge:  No Barriers Identified   [place], [place], LCSW 10/08/2014, 2:17 PM [phone]

## 2014-10-08 NOTE — Progress Notes (Signed)
4:50 PM I agree with HPI/GPe and A/P per Dr. Dr. Hal Hope      Nausea Had some emesis today Not toelrating diet well No recent stool feels bloated  Very poor overall prognosis Appreciate PMT input onceussed with daughter-Plan is to return home with Hopsice once Indwelling tunnelled cathter is placed on 10/09/14

## 2014-10-09 ENCOUNTER — Ambulatory Visit (HOSPITAL_COMMUNITY): Admission: RE | Admit: 2014-10-09 | Payer: Medicare Other | Source: Ambulatory Visit

## 2014-10-09 ENCOUNTER — Inpatient Hospital Stay (HOSPITAL_COMMUNITY)
Admit: 2014-10-09 | Discharge: 2014-10-09 | Disposition: A | Discharge status: Home / Self Care | Payer: Medicare Other | Attending: Internal Medicine | Admitting: Internal Medicine

## 2014-10-09 ENCOUNTER — Inpatient Hospital Stay (HOSPITAL_COMMUNITY): Payer: Medicare Other

## 2014-10-09 DIAGNOSIS — R180 Malignant ascites: Secondary | ICD-10-CM

## 2014-10-09 LAB — PROTIME-INR
INR: 1.23 (ref 0.00–1.49)
Prothrombin Time: 15.6 seconds — ABNORMAL HIGH (ref 11.6–15.2)

## 2014-10-09 LAB — CBC
HCT: 32.1 % — ABNORMAL LOW (ref 36.0–46.0)
Hemoglobin: 9.9 g/dL — ABNORMAL LOW (ref 12.0–15.0)
MCH: 27.7 pg (ref 26.0–34.0)
MCHC: 30.8 g/dL (ref 30.0–36.0)
MCV: 89.7 fL (ref 78.0–100.0)
PLATELETS: 367 10*3/uL (ref 150–400)
RBC: 3.58 MIL/uL — ABNORMAL LOW (ref 3.87–5.11)
RDW: 15.9 % — ABNORMAL HIGH (ref 11.5–15.5)
WBC: 17.2 10*3/uL — ABNORMAL HIGH (ref 4.0–10.5)

## 2014-10-09 LAB — BASIC METABOLIC PANEL
Anion gap: 14 (ref 5–15)
Anion gap: 9 (ref 5–15)
BUN: 79 mg/dL — AB (ref 6–20)
BUN: 77 mg/dL — ABNORMAL HIGH (ref 6–20)
CALCIUM: 8.8 mg/dL — AB (ref 8.9–10.3)
CHLORIDE: 102 mmol/L (ref 101–111)
CO2: 19 mmol/L — ABNORMAL LOW (ref 22–32)
CO2: 21 mmol/L — ABNORMAL LOW (ref 22–32)
Calcium: 8.6 mg/dL — ABNORMAL LOW (ref 8.9–10.3)
Chloride: 103 mmol/L (ref 101–111)
Creatinine, Ser: 3.28 mg/dL — ABNORMAL HIGH (ref 0.44–1.00)
Creatinine, Ser: 3.4 mg/dL — ABNORMAL HIGH (ref 0.44–1.00)
GFR calc Af Amer: 13 mL/min — ABNORMAL LOW (ref >60)
GFR calc non Af Amer: 12 mL/min — ABNORMAL LOW (ref >60)
GFR calc non Af Amer: 11 mL/min — ABNORMAL LOW (ref >60)
GFR, EST AFRICAN AMERICAN: 13 mL/min — AB (ref >60)
Glucose, Bld: 77 mg/dL (ref 65–99)
Glucose, Bld: 92 mg/dL (ref 65–99)
POTASSIUM: 6.5 mmol/L — AB (ref 3.5–5.1)
Potassium: 5.4 mmol/L — ABNORMAL HIGH (ref 3.5–5.1)
Sodium: 135 mmol/L (ref 135–145)
Sodium: 133 mmol/L — ABNORMAL LOW (ref 135–145)

## 2014-10-09 MED ORDER — VANCOMYCIN HCL 500 MG IV SOLR
500.0000 mg | INTRAVENOUS | Status: AC
  Administered 2014-10-09: 500 mg via INTRAVENOUS
  Filled 2014-10-09: qty 500

## 2014-10-09 MED ORDER — LIDOCAINE HCL 1 % IJ SOLN
INTRAMUSCULAR | Status: AC
  Filled 2014-10-09: qty 20

## 2014-10-09 MED ORDER — FENTANYL CITRATE (PF) 100 MCG/2ML IJ SOLN
INTRAMUSCULAR | Status: DC
Start: 2014-10-09 — End: 2014-10-09
  Filled 2014-10-09: qty 2

## 2014-10-09 MED ORDER — MIDAZOLAM HCL 2 MG/2ML IJ SOLN
INTRAMUSCULAR | Status: AC
  Filled 2014-10-09: qty 2

## 2014-10-09 MED ORDER — ENOXAPARIN SODIUM 30 MG/0.3ML ~~LOC~~ SOLN
30.0000 mg | Freq: Every day | SUBCUTANEOUS | Status: DC
  Administered 2014-10-10: 30 mg via SUBCUTANEOUS
  Filled 2014-10-09: qty 0.3

## 2014-10-09 MED ORDER — SODIUM POLYSTYRENE SULFONATE 15 GM/60ML PO SUSP
15.0000 g | Freq: Once | ORAL | Status: AC
  Administered 2014-10-09: 15 g via ORAL
  Filled 2014-10-09: qty 60

## 2014-10-09 NOTE — Procedures (Signed)
Successful RLQ TUNNELED PERITONEAL DRAIN ( PLEURX) NO COMP STABLE FULL REPORT IN PACS

## 2014-10-09 NOTE — Progress Notes (Signed)
Chaplain visited patient. Chaplain provided a presence as well as support. Patient gave chaplain a prayer request. Chaplain prayed a prayer of comfort regarding patient's request. Patient asked chaplain to come again. Chaplain will continue to follow.   10/09/14 1100  Clinical Encounter Type  Visited With Patient and family together  Visit Type Follow-up;Spiritual support;Social support  Referral From Palliative care team

## 2014-10-09 NOTE — Progress Notes (Signed)
[name] [name] NKN:[id number] DOB: [birth year] DOA: 10/07/2014 PCP: [name]  Brief narrative: 81 y/o ? recent Gastric adenoCa + malignant ascites = poor appetite Pallaitive care consulted to help and ultimately IR drain placed  Past medical history-As per Problem list Chart reviewed as below-   Consultants: IR Palliative  Procedures:  Antibiotics:    Subjective   alert pleasant oriented in nad   Objective    Interim History:   Telemetry:    Objective: Filed Vitals:   10/09/14 1308 10/09/14 1310 10/09/14 1316 10/09/14 1321  BP: 97/65 86/61 88/58  80/53  Pulse: 116 121 111 111  Temp:      TempSrc:      Resp: 22 21 22 23   Height:      Weight:      SpO2: 95% 95% 96% 96%    Intake/Output Summary (Last 24 hours) at 10/09/14 1416 Last data filed at 10/09/14 0600  Gross per 24 hour  Intake  877.5 ml  Output    180 ml  Net  697.5 ml    Exam:  General: eomi, ncat Cardiovascular:  s1 s2 no m/r/g Respiratory: clear no added sound Abdomen:  Soft nt nd no rebound Skin no le edema Neuro intact  Data Reviewed: Basic Metabolic Panel:  Recent Labs Lab 10/07/14 2234 10/08/14 0318 10/09/14 0400 10/09/14 0620  NA 133*  --  135 133*  K 5.4*  --  6.5* 5.4*  CL 99*  --  102 103  CO2 25  --  19* 21*  GLUCOSE 91  --  77 92  BUN 79*  --  79* 77*  CREATININE 3.79* 3.64* 3.28* 3.40*  CALCIUM 8.7*  --  8.8* 8.6*   Liver Function Tests:  Recent Labs Lab 10/07/14 2234  AST 43*  ALT 34  ALKPHOS 59  BILITOT 0.4  PROT 5.4*  ALBUMIN 1.7*    Recent Labs Lab 10/07/14 2234  LIPASE 42   No results for input(s): AMMONIA in the last 168 hours. CBC:  Recent Labs Lab 10/07/14 2234 10/08/14 0318 10/09/14 0400  WBC 16.3* 15.4* 17.2*  NEUTROABS 14.9*  --   --   HGB 9.9* 9.9* 9.9*  HCT 31.0* 31.5* 32.1*  MCV 88.6 88.7 89.7  PLT 333 313 367   Cardiac Enzymes: No results for input(s): CKTOTAL, CKMB, CKMBINDEX, TROPONINI in the last 168  hours. BNP: Invalid input(s): POCBNP CBG: No results for input(s): GLUCAP in the last 168 hours.  No results found for this or any previous visit (from the past 240 hour(s)).   Studies:              All Imaging reviewed and is as per above notation   Scheduled Meds: . amiodarone  200 mg Oral Daily  . budesonide-formoterol  2 puff Inhalation BID  . cefTRIAXone (ROCEPHIN)  IV  1 g Intravenous Q24H  . dexamethasone  2 mg Oral BID WC  . [START ON 10/10/2014] enoxaparin (LOVENOX) injection  30 mg Subcutaneous Daily  . feeding supplement  1 Container Oral TID BM  . feeding supplement (ENSURE ENLIVE)  237 mL Oral BID BM  . hydrocortisone cream   Topical TID  . lidocaine      . pantoprazole  40 mg Oral Daily  . saccharomyces boulardii  250 mg Oral BID  . sodium polystyrene  15 g Oral Once   Continuous Infusions:    Assessment/Plan:  End-stage metastatic Colon Ca with poor overall prognosis -Indwelling tunnel drain placed for  comfort -Hospice choice to be offered -likely can d/c home in the next 24-48 hours  Afib, Chr-CHad2Vasc2 >4 -continue Amio 200 daily  Hyperkalemia -2/2 to ascites physiology -give Kayexalate x 1 -stop checking labs as focus of car eis comfort  Code Status: DNR Family Communication: family +-discussed with daughter Disposition Plan: home c home hopsice DVT prophylaxis: SCD Consultants:   Verneita Griffes, MD  [facility] Pager [phone] 10/09/2014, 2:16 PM    LOS: 2 days

## 2014-10-09 NOTE — Progress Notes (Signed)
Notified by Marisue Ivan of family request for Hospice and [place] services at home after discharge. Chart and patient Information currently under review to confirm hospice eligibility.   Spoke with [name], via phone conversation, to initiate education related to hospice philosophy, services and team approach to care. Family verbalized understanding of the information provided. Per discussion plan is for discharge to daughter's home tomorrow.   Please send signed completed DNR form home with patient.  Patient will need prescriptions for discharge comfort medications.   DME needs discussed and family requested hospital bed with 1/2 rails, APP overlay, and bedside table for delivery to the home today.  [facility] equipment manager Jewel Ysidro Evert notified and will contact [place] to arrange delivery to the home.  The home address has been verified and is correct in the chart; [name] family member to be contacted to arrange time of delivery.   [facility] Referral [facility] aware of the above.  Completed discharge summary will need to be faxed to [facility] at [phone] when final.   Please notify [facility] when patient is ready to leave unit at discharge-call [phone].  [facility] information and contact numbers have been given to [address].    Please call with any questions.  Annia Belt RN, [place] [facility] Liaison  [phone]

## 2014-10-09 NOTE — Progress Notes (Signed)
Chaplain visited with patient and daughter. Chaplain provided a listening presence as well as support. Patient asked chaplain to pray. Chaplain prayed a silent prayer. Chaplain will continue to follow.   10/08/14 1100  Clinical Encounter Type  Visited With Patient and family together  Visit Type Initial;Spiritual support;Social support  Referral From Palliative care team

## 2014-10-09 NOTE — Care Management Important Message (Signed)
Important Message  Patient Details  Name: [name] MRN: [id number] Date of Birth: May 10, 1923   Medicare Important Message Given:  Yes-second notification given    Camillo Flaming 10/09/2014, 1:35 [place] Message  Patient Details  Name: [name] MRN: [id number] Date of Birth: 14-Sep-1923   Medicare Important Message Given:  Yes-second notification given    Camillo Flaming 10/09/2014, 1:35 PM

## 2014-10-09 NOTE — Progress Notes (Signed)
CRITICAL VALUE ALERT  Critical value received:K+ 6.5 (slight hemolysis per lab)  Date of notification:  10/09/14  Time of notification:  0510  Critical value read back:yes  Nurse who received alert:  Azzie Glatter, RN  MD notified (1st page): Schorr Time of first page:0512  MD notified (2nd page): n/a  Time of second page:n/a  Responding MD:  Schorr  Time MD responded: [phone] (orders to repeat lab)

## 2014-10-09 NOTE — Care Management Note (Signed)
Case Management Note  Patient Details  Name: [name] [name] MRN: [id number] Date of Birth: December 02, 1923  Subjective/Objective:                  Abdominal pain (Ascites)  Action/Plan: Discharge  planning  Expected Discharge Date:  10/09/14               Expected Discharge Plan:  Home w Hospice Care  In-House Referral:     Discharge planning Services  CM Consult  Post Acute Care Choice:    Choice offered to:  Adult Children  DME Arranged:  Hospital bed DME Agency:     HH Arranged:    HH Agency:  [facility]  Status of Service:  Completed, signed off  Medicare Important Message Given:    Date Medicare IM Given:    Medicare IM give by:    Date Additional Medicare IM Given:    Additional Medicare Important Message give by:     If discussed at [place] of Stay Meetings, dates discussed:    Additional Comments: CM met with pt and daughter, [name] [phone] and pt defers all decision making to daughter, [name].  Choice of Home hospice from [place] as she and pt wish to go to [facility] when pt is nearer to end of life.  Pt will be going home with [facility] [address] [place], [place] [address].  [name] states pt has wheelchair, cane and rolling walker and will NEED a hospital bed delivered to Cindy's home prior to discharge.  CM called Referral to [facility] and rep stated Lattie Haw will get in touch with daughter, [name] to make arrangements.  No other CM needs were communicated. Dellie Catholic, RN 10/09/2014, 12:21 PM

## 2014-10-10 MED ORDER — SODIUM POLYSTYRENE SULFONATE PO POWD: Freq: Every day | ORAL | Status: AC

## 2014-10-10 MED ORDER — LORAZEPAM 0.5 MG PO TABS: 0.5000 mg | ORAL_TABLET | Freq: Four times a day (QID) | ORAL | Status: AC | PRN

## 2014-10-10 MED ORDER — MORPHINE SULFATE (CONCENTRATE) 10 MG/0.5ML PO SOLN: 5.0000 mg | ORAL | Status: AC | PRN

## 2014-10-10 NOTE — Progress Notes (Signed)
Pt for discharge home with Hospice and [place].   CSW received notification from pt daughter that pt equipment had been delivered to pt daughter's home. CSW confirmed address with pt daughter.   CSW notified RN and arranged ambulance transport for pt to home.   No further social work needs identified at this time.  CSW signing off.   Alison Murray, MSW, [place] Work [phone]

## 2014-10-10 NOTE — Discharge Summary (Signed)
Physician Discharge Summary  [name] [name] DXA:[id number] DOB: [birth year] DOA: 10/07/2014  PCP: [name]  Admit date: 10/07/2014 Discharge date: 10/10/2014  Time spent: 25 minutes  Recommendations for Outpatient Follow-up:  1. Hospice to follow on d/c home  2. Needs 2 liters oxygen at all times as OP 3. Ativan and morphine hard-scripts provided at d/c 4. Hospice to manage in-dwelling abdominal drain   Discharge Diagnoses:  Principal Problem:   Failure to thrive in adult Active Problems:   Atrial fibrillation   Chronic combined systolic and diastolic heart failure   Ascites   Abdominal pain   Acute renal failure   Abdominal pain, right upper quadrant   Pressure ulcer   Cancer related pain   Discharge Condition: gaurded  Diet recommendation: comfort  Filed Weights   10/08/14 0124  Weight: 71.623 kg (157 lb 14.4 oz)    History of present illness:  81 y/o ? recent Gastric adenoCa + malignant ascites = poor appetite Pallaitive care consulted to help and ultimately IR drain placed Medications were delineated As she was hypotensive, her medications for a fib were narrowed to amiodarone alone She was somewhat hyperkalemic and this was treated in the hopsital We will provide Kayexalate on d/c home to take Family and hospice to determine going forward role for labwork, other supportive measures and will be d/c home with hopsice For COPD inhlaers can be continued for comfort Would NOT Rx iron/floic acid etc etc as goal is COMFORT   Discharge Exam: Filed Vitals:   10/10/14 0531  BP: 74/57  Pulse: 106  Temp: 97.5 F (36.4 C)  Resp: 20    General: eomi, ncat Cardiovascular: s1 s 2no m/r/g Respiratory: clear  Discharge Instructions   Discharge Instructions    Diet - low sodium heart healthy    Complete by:  As directed      Increase activity slowly    Complete by:  As directed           Current Discharge Medication List    START taking these  medications   Details  LORazepam (ATIVAN) 0.5 MG tablet Take 1 tablet (0.5 mg total) by mouth every 6 (six) hours as needed for anxiety. Qty: 30 tablet, Refills: 0    Morphine Sulfate (MORPHINE CONCENTRATE) 10 MG/0.5ML SOLN concentrated solution Take 0.25 mLs (5 mg total) by mouth every 2 (two) hours as needed for moderate pain, severe pain or shortness of breath. Qty: 42 mL, Refills: 0      CONTINUE these medications which have NOT CHANGED   Details  amiodarone (PACERONE) 200 MG tablet Take 1 tablet (200 mg total) by mouth daily. Qty: 90 tablet, Refills: 4   Associated Diagnoses: Atrial fibrillation, unspecified    budesonide-formoterol (SYMBICORT) 160-4.5 MCG/ACT inhaler Inhale 2 puffs into the lungs 2 (two) times daily.    cholecalciferol (VITAMIN D) 1000 UNITS tablet Take 1,000-2,000 Units by mouth 2 (two) times daily. Take 2000 units in the morning and 1000 units in the evening    clotrimazole (MYCELEX) 10 MG troche Take 1 tablet (10 mg total) by mouth 5 (five) times daily.    Docusate Calcium (STOOL SOFTENER PO) Take 1 tablet by mouth as needed (constipation).    furosemide (LASIX) 20 MG tablet Take 1 tablet (20 mg total) by mouth daily. Qty: 30 tablet, Refills: 11    levalbuterol (XOPENEX HFA) 45 MCG/ACT inhaler Inhale 2 puffs into the lungs every 6 (six) hours as needed for wheezing.     omeprazole (PRILOSEC)  40 MG capsule Take 40 mg by mouth every evening.    ondansetron (ZOFRAN) 4 MG tablet Take 1 tablet (4 mg total) by mouth every 6 (six) hours as needed for nausea. Qty: 20 tablet, Refills: 0    polyethylene glycol (MIRALAX / GLYCOLAX) packet Take 17 g by mouth daily as needed for mild constipation.    traMADol (ULTRAM) 50 MG tablet Take 2 tablets (100 mg total) by mouth every 6 (six) hours as needed for moderate pain. Qty: 30 tablet, Refills: 0    dexamethasone (DECADRON) 2 MG tablet Take 2 mg by mouth 2 (two) times daily with a meal.      STOP taking these  medications     diltiazem (TIAZAC) 240 MG 24 hr capsule      feeding supplement, ENSURE ENLIVE, (ENSURE ENLIVE) LIQD      ferrous sulfate 325 (65 FE) MG tablet      folic acid (FOLVITE) 1 MG tablet      levalbuterol (XOPENEX) 0.63 MG/3ML nebulizer solution      montelukast (SINGULAIR) 10 MG tablet      Multiple Vitamin (MULTIVITAMIN WITH MINERALS) TABS tablet      raloxifene (EVISTA) 60 MG tablet      Saccharomyces boulardii (FLORASTOR PO)      thiamine 100 MG tablet        Allergies  Allergen Reactions  . Baby Powder [Methylbenzethonium] Shortness Of Breath  . Demerol [Meperidine] Hives  . Fexofenadine Other (See Comments)    dizziness  . Penicillins Hives    Tolerates Ceftriaxone.  Marland Kitchen Spiriva Handihaler [Tiotropium Bromide Monohydrate]     Caused chest pains      The results of significant diagnostics from this hospitalization (including imaging, microbiology, ancillary and laboratory) are listed below for reference.    Significant Diagnostic Studies: Ct Abdomen Pelvis Wo Contrast  10/07/2014   CLINICAL DATA:  Increasing pain and pressure in the abdomen over the past few days. Paracentesis 2 weeks ago.  EXAM: CT CHEST, ABDOMEN AND PELVIS WITHOUT CONTRAST  TECHNIQUE: Multidetector CT imaging of the chest, abdomen and pelvis was performed following the standard protocol without IV contrast.  COMPARISON:  09/12/2014  FINDINGS: CT CHEST FINDINGS  No mediastinal or hilar adenopathy is evident, with mild study limitations due to the lack of intravenous contrast. Axillary regions appear unremarkable. No pulmonary metastases are evident. There are mild parenchymal lung opacities in the posterior lower lobes, adjacent to the small pleural effusions. This may be atelectatic.  CT ABDOMEN AND PELVIS FINDINGS  There is a moderately large volume peritoneal ascites, similar to the CT of 09/12/2014. There are unremarkable unenhanced appearances of the liver, with no evidence of hepatic  metastasis. The spleen, pancreas, adrenals and kidneys appear unremarkable. Stomach is mildly distended, with mural thickening of the antral region. Small bowel is unremarkable. There is stranding opacity and a suggestion of nodularity in the omentum, suspicious for neoplastic involvement. The uterus and ovaries appear unremarkable.  No significant skeletal lesions are evident. Moderate kyphosis and moderate thoracolumbar degenerative disc changes are present. There is a grade 1 spondylolisthesis at L4-5 which appears degenerative. There also is minimal degenerative appearing retrolisthesis at L5-S1.  IMPRESSION: Moderately large volume peritoneal ascites. Suspicious stranding opacities in the omentum, likely neoplastic. Mural thickening in the gastric antrum again noted.  Small pleural effusions with adjacent atelectatic appearing posterior lower lobe alveolar opacities.  No pulmonary or hepatic metastases are evident. No significant skeletal lesions are evident.   Electronically Signed  By: Andreas Newport M.D.   On: 10/07/2014 23:38   Ct Abdomen Pelvis Wo Contrast  09/12/2014   CLINICAL DATA:  81 year old female with abdominal pain and distention.  EXAM: CT ABDOMEN AND PELVIS WITHOUT CONTRAST  TECHNIQUE: Multidetector CT imaging of the abdomen and pelvis was performed following the standard protocol without IV contrast.  COMPARISON:  11/03/2011 CT  FINDINGS: Please note that parenchymal abnormalities may be missed without intravenous contrast.  Lower chest: Cardiomegaly, tiny bilateral pleural effusions and bibasilar atelectasis identified.  Hepatobiliary: The liver and gallbladder are unremarkable. There is no evidence of biliary dilatation.  Pancreas: Unremarkable  Spleen: Unremarkable  Adrenals/Urinary Tract: Bilateral renal atrophy identified. The adrenal glands and bladder are unremarkable.  Stomach/Bowel: There is possible circumferential wall thickening of the distal stomach. Colonic diverticulosis is  identified without evidence of diverticulitis. No other focal bowel wall thickening is identified.  Vascular/Lymphatic: Abdominal aortic atherosclerotic calcifications are noted. There is no evidence of abdominal aortic aneurysm. No enlarged lymph nodes are noted.  Reproductive: The uterus and adnexal regions are unremarkable.  Other: A large amount of ascites is noted. Stranding in the omentum is present. No gross peritoneal nodules or masses are identified. There is no evidence of pneumoperitoneum.  Musculoskeletal: Surgical hardware within the proximal left femur is noted. Moderate -severe degenerative changes in the lumbar spine are identified. Grade 1 anterolisthesis of L4 on L5 is unchanged.  IMPRESSION: Large amount of ascites without identifiable cause. Omental stranding may represent edema but tumor involvement of the omentum is not entirely excluded. No adnexal masses identified.  Possible distal stomach wall thickening. Consider direct inspection as clinically indicated.  Bilateral renal atrophy.  Cardiomegaly, tiny bilateral pleural effusions and mild bibasilar atelectasis.   Electronically Signed   By: Margarette Canada M.D.   On: 09/12/2014 18:34   Ct Chest Wo Contrast  10/07/2014   CLINICAL DATA:  Increasing pain and pressure in the abdomen over the past few days. Paracentesis 2 weeks ago.  EXAM: CT CHEST, ABDOMEN AND PELVIS WITHOUT CONTRAST  TECHNIQUE: Multidetector CT imaging of the chest, abdomen and pelvis was performed following the standard protocol without IV contrast.  COMPARISON:  09/12/2014  FINDINGS: CT CHEST FINDINGS  No mediastinal or hilar adenopathy is evident, with mild study limitations due to the lack of intravenous contrast. Axillary regions appear unremarkable. No pulmonary metastases are evident. There are mild parenchymal lung opacities in the posterior lower lobes, adjacent to the small pleural effusions. This may be atelectatic.  CT ABDOMEN AND PELVIS FINDINGS  There is a  moderately large volume peritoneal ascites, similar to the CT of 09/12/2014. There are unremarkable unenhanced appearances of the liver, with no evidence of hepatic metastasis. The spleen, pancreas, adrenals and kidneys appear unremarkable. Stomach is mildly distended, with mural thickening of the antral region. Small bowel is unremarkable. There is stranding opacity and a suggestion of nodularity in the omentum, suspicious for neoplastic involvement. The uterus and ovaries appear unremarkable.  No significant skeletal lesions are evident. Moderate kyphosis and moderate thoracolumbar degenerative disc changes are present. There is a grade 1 spondylolisthesis at L4-5 which appears degenerative. There also is minimal degenerative appearing retrolisthesis at L5-S1.  IMPRESSION: Moderately large volume peritoneal ascites. Suspicious stranding opacities in the omentum, likely neoplastic. Mural thickening in the gastric antrum again noted.  Small pleural effusions with adjacent atelectatic appearing posterior lower lobe alveolar opacities.  No pulmonary or hepatic metastases are evident. No significant skeletal lesions are evident.   Electronically Signed  By: Andreas Newport M.D.   On: 10/07/2014 23:38   US Renal  10/02/2014   CLINICAL DATA:  Acute renal insufficiency  EXAM: RENAL / URINARY TRACT ULTRASOUND COMPLETE  COMPARISON:  None.  FINDINGS: Right Kidney:  Length: 9.7 cm. Echogenicity within normal limits. No mass or hydronephrosis visualized.  Left Kidney:  Length: 9.7 cm. Echogenicity within normal limits. No mass or hydronephrosis visualized.  Bladder:  Appears normal for degree of bladder distention.  Moderate volume ascites noted.  IMPRESSION: Negative for hydronephrosis.  Ascites noted.   Electronically Signed   By: Andreas Newport M.D.   On: 10/02/2014 03:12   US Paracentesis  10/01/2014   INDICATION: Ascites, request for paracentesis.  EXAM: ULTRASOUND-GUIDED PARACENTESIS  COMPARISON:   Paracentesis 09/23/14.  MEDICATIONS: None.  COMPLICATIONS: None immediate  TECHNIQUE: Informed written consent was obtained from the patient after a discussion of the risks, benefits and alternatives to treatment. A timeout was performed prior to the initiation of the procedure.  Initial ultrasound scanning demonstrates a large amount of ascites within the right lower abdominal quadrant. The right lower abdomen was prepped and draped in the usual sterile fashion. 1% lidocaine was used for local anesthesia.  Under direct ultrasound guidance, a 19 gauge, 7-cm, Yueh catheter was introduced. An ultrasound image was saved for documentation purposed. The paracentesis was performed. The catheter was removed and a dressing was applied. The patient tolerated the procedure well without immediate post procedural complication.  FINDINGS: A total of approximately 4 liters of serous fluid was removed.  IMPRESSION: Successful ultrasound-guided paracentesis yielding 4 liters of peritoneal fluid.  Read By:  Tsosie Billing PA-C   Electronically Signed   By: Aletta Edouard M.D.   On: 10/01/2014 13:38   US Paracentesis  09/23/2014   INDICATION: Recurrent ascites, request for diagnostic and therapeutic paracentesis.  EXAM: ULTRASOUND-GUIDED PARACENTESIS  COMPARISON:  Paracentesis 09/18/14.  MEDICATIONS: None.  COMPLICATIONS: None immediate  TECHNIQUE: Informed written consent was obtained from the patient after a discussion of the risks, benefits and alternatives to treatment. A timeout was performed prior to the initiation of the procedure.  Initial ultrasound scanning demonstrates a moderate amount of ascites within the right lower abdominal quadrant. The right lower abdomen was prepped and draped in the usual sterile fashion. 1% lidocaine was used for local anesthesia.  Under direct ultrasound guidance, a 19 gauge, 10-cm, Yueh catheter was introduced. An ultrasound image was saved for documentation purposed. The paracentesis was  performed. The catheter was removed and a dressing was applied. The patient tolerated the procedure well without immediate post procedural complication.  FINDINGS: A total of approximately 3.1 liters of serous fluid was removed. Samples were sent to the laboratory as requested by the clinical team.  IMPRESSION: Successful ultrasound-guided paracentesis yielding 3.1 liters of peritoneal fluid.  Read By:  Tsosie Billing PA-C   Electronically Signed   By: Corrie Mckusick D.O.   On: 09/23/2014 15:33   US Paracentesis  09/18/2014   INDICATION: Intra-abdominal ascites of uncertain etiology. Please perform ultrasound-guided paracentesis for diagnostic and therapeutic purposes.  EXAM: ULTRASOUND-GUIDED PARACENTESIS  COMPARISON:  None.  MEDICATIONS: 10 cc 1% lidocaine  COMPLICATIONS: None immediate  TECHNIQUE: Informed written consent was obtained from the patient after a discussion of the risks, benefits and alternatives to treatment. A timeout was performed prior to the initiation of the procedure.  Initial ultrasound scanning demonstrates a large amount of ascites within the right lower abdominal quadrant. The right lower abdomen was prepped and  draped in the usual sterile fashion. 1% lidocaine with epinephrine was used for local anesthesia. Under direct ultrasound guidance, a 19 gauge, 7-cm, Yueh catheter was introduced. An ultrasound image was saved for documentation purposed. The paracentesis was performed. The catheter was removed and a dressing was applied. The patient tolerated the procedure well without immediate post procedural complication.  FINDINGS: A total of approximately 3.8 liters of yellow fluid was removed. Samples were sent to the laboratory as requested by the clinical team.  IMPRESSION: Successful ultrasound-guided paracentesis yielding 3.8 liters of peritoneal fluid.  50 gr IV albumin during procedure per MD.  Read by:  Lavonia Drafts [facility]   Electronically Signed   By: Sandi Mariscal M.D.   On:  09/18/2014 15:46   Ir Perc Athena Masse Perit Cath [facility]  10/09/2014   CLINICAL DATA:  Abdominal distention and discomfort. Malignant ascites, gastric adenocarcinoma.  EXAM: ULTRASOUND AND FLUOROSCOPICALLY GUIDED RIGHT LOWER QUADRANT TUNNELED PERITONEAL DRAIN ( PLEURX DRAIN )  Date:  8/4/20168/06/2014 2:22 pm  Radiologist:  M. Daryll Brod, MD  Guidance:  Ultrasound and fluoroscopic  FLUOROSCOPY TIME:  54 seconds, 3 mGy  MEDICATIONS AND MEDICAL HISTORY: 500 mg vancomycin administered within 1 hour of the procedure. 1% lidocaine locally.  ANESTHESIA/SEDATION: None.  Patient is hypotensive.  CONTRAST:  None.  COMPLICATIONS: None immediate  PROCEDURE: Informed consent was obtained from the patient following explanation of the procedure, risks, benefits and alternatives. The patient understands, agrees and consents for the procedure. All questions were addressed. A time out was performed.  Maximal barrier sterile technique utilized including caps, mask, sterile gowns, sterile gloves, large sterile drape, hand hygiene, and ChloraPrep.  Previous imaging reviewed. Survey ultrasound performed. A large pocket of abdominal ascites was demonstrated in the right lower quadrant. Overlying skin was marked.  Under sterile conditions and local anesthesia, ultrasound percutaneous needle access performed of the peritoneal cavity. Amplatz guidewire inserted across the abdominal cavity under fluoroscopy. In the adjacent soft tissues, a subcutaneous tunnel was created under sterile conditions and local anesthesia. The PleurX catheter was tunneled subcutaneously to the entry site. Tubing was advanced into the peritoneal cavity through a valved peel-away sheath. Position confirmed with fluoroscopy. Images obtained for documentation. Catheter secured with a Prolene suture. Entry site closed with subcuticular Monocryl and derma bond.  Catheter was connected to suction canisters and a 3.5 L paracentesis was immediately performed.  Sterile dressing  applied over the site. No immediate complication. Patient tolerated the procedure well.  IMPRESSION: Successful ultrasound and fluoroscopic tunneled peritoneal drain insertion (PleurX catheter)  3.5 L paracentesis performed after insertion.   Electronically Signed   By: Jerilynn Mages.  Shick M.D.   On: 10/09/2014 15:56   Dg Chest Port 1 View  10/07/2014   CLINICAL DATA:  Acute onset of generalized abdominal pain. Initial encounter.  EXAM: PORTABLE CHEST - 1 VIEW  COMPARISON:  Chest radiograph performed 09/23/2014  FINDINGS: The lungs are mildly hypoexpanded. Mild vascular congestion is noted. Mild bibasilar atelectasis or scarring is seen. There is no evidence of pleural effusion or pneumothorax.  The cardiomediastinal silhouette is mildly enlarged. No acute osseous abnormalities are seen. Clips are seen overlying the right side of the neck.  IMPRESSION: Mild vascular congestion and mild cardiomegaly noted. Mild bibasilar atelectasis or scarring seen. Lungs mildly hypoexpanded.   Electronically Signed   By: Garald Balding M.D.   On: 10/07/2014 21:38   Dg Chest Port 1 View  09/23/2014   CLINICAL DATA:  Shortness of breath.  Chest congestion.  EXAM: PORTABLE CHEST - 1 VIEW  COMPARISON:  Chest x-rays dated 06/21/2014, 02/26/2014, 07/24/2013 and 07/06/2013 and 04/12/2012  FINDINGS: There is cardiomegaly with new pulmonary vascular congestion and very slight accentuation of the interstitial markings. No discrete effusions. No acute osseous abnormality.  IMPRESSION: Pulmonary vascular congestion with minimal interstitial edema.   Electronically Signed   By: Lorriane Shire M.D.   On: 09/23/2014 14:30   Dg Abd Portable 1v  09/23/2014   CLINICAL DATA:  Abdominal distention and pain.  Ascites.  EXAM: PORTABLE ABDOMEN - 1 VIEW  COMPARISON:  CT scan dated 09/12/2014  FINDINGS: Bowel gas pattern is normal. Residual contrast in the appendix from the recent CT scan. Diffuse degenerative changes and scoliosis in the lumbar spine.  Chronic degenerative or posttraumatic changes of the right pubic body. No acute osseous abnormality. Healed left hip fracture.  IMPRESSION: No acute abnormality of the abdomen.   Electronically Signed   By: Lorriane Shire M.D.   On: 09/23/2014 14:43    Microbiology: No results found for this or any previous visit (from the past 240 hour(s)).   Labs: Basic Metabolic Panel:  Recent Labs Lab 10/07/14 2234 10/08/14 0318 10/09/14 0400 10/09/14 0620  NA 133*  --  135 133*  K 5.4*  --  6.5* 5.4*  CL 99*  --  102 103  CO2 25  --  19* 21*  GLUCOSE 91  --  77 92  BUN 79*  --  79* 77*  CREATININE 3.79* 3.64* 3.28* 3.40*  CALCIUM 8.7*  --  8.8* 8.6*   Liver Function Tests:  Recent Labs Lab 10/07/14 2234  AST 43*  ALT 34  ALKPHOS 59  BILITOT 0.4  PROT 5.4*  ALBUMIN 1.7*    Recent Labs Lab 10/07/14 2234  LIPASE 42   No results for input(s): AMMONIA in the last 168 hours. CBC:  Recent Labs Lab 10/07/14 2234 10/08/14 0318 10/09/14 0400  WBC 16.3* 15.4* 17.2*  NEUTROABS 14.9*  --   --   HGB 9.9* 9.9* 9.9*  HCT 31.0* 31.5* 32.1*  MCV 88.6 88.7 89.7  PLT 333 313 367   Cardiac Enzymes: No results for input(s): CKTOTAL, CKMB, CKMBINDEX, TROPONINI in the last 168 hours. BNP: BNP (last 3 results)  Recent Labs  02/26/14 1550 09/12/14 1448 09/22/14 1701  BNP 292.3* 360.2* 165.9*    ProBNP (last 3 results) No results for input(s): PROBNP in the last 8760 hours.  CBG: No results for input(s): GLUCAP in the last 168 hours.     SignedNita Sells  [facility] 10/10/2014, 8:36 AM

## 2014-10-10 NOTE — Care Management Note (Signed)
Case Management Note  Patient Details  Name: [name] [name] MRN: [id number] Date of Birth: 03-24-1923  Subjective/Objective:  Noted d/c order.TC [facility] left message w/Lisa Strandberg liason-informing of d/c today home w/[facility] services-hospital bed-they will manage d/c, & dme arrangements(via [facility]), & coordinate delivery w/family.                  Action/Plan:d/c plan home w/hospice services.   Expected Discharge Date:                  Expected Discharge Plan:  Home w Hospice Care  In-House Referral:     Discharge planning Services  CM Consult  Post Acute Care Choice:    Choice offered to:  Adult Children  DME Arranged:  Hospital bed DME Agency:     HH Arranged:    HH Agency:  [facility] [place]  Status of Service:  Completed, signed off  Medicare Important Message Given:  Yes-second notification given Date Medicare IM Given:    Medicare IM give by:    Date Additional Medicare IM Given:    Additional Medicare Important Message give by:     If discussed at [place] of Stay Meetings, dates discussed:    Additional Comments:  Dessa Phi, RN 10/10/2014, 9:09 AM

## 2014-10-20 ENCOUNTER — Encounter: Payer: Self-pay | Admitting: *Deleted

## 2014-10-20 NOTE — Progress Notes (Signed)
Patient expired 11/01/14. appts deleted from epic. Dr Alen Blew notified

## 2014-11-05 LAB — AFB CULTURE WITH SMEAR (NOT AT ARMC): Acid Fast Smear: NONE SEEN

## 2014-11-06 DEATH — deceased

## 2014-11-17 ENCOUNTER — Other Ambulatory Visit (HOSPITAL_COMMUNITY): Payer: Medicare Other

## 2014-11-17 ENCOUNTER — Ambulatory Visit: Payer: Medicare Other | Admitting: Family

## 2016-05-31 IMAGING — US US PARACENTESIS
1 series · 4 of 4 positions shown · non-contrast
Comparison: None.

MEDICATIONS:
10 cc 1% lidocaine

COMPLICATIONS:
None immediate

INDICATION: Intra-abdominal ascites of uncertain etiology. Please perform
ultrasound-guided paracentesis for diagnostic and therapeutic
purposes.

EXAM:
ULTRASOUND-GUIDED PARACENTESIS
TECHNIQUE: Informed written consent was obtained from the patient after a
discussion of the risks, benefits and alternatives to treatment. A
timeout was performed prior to the initiation of the procedure.

[Series 1: us paracentesis · 0.27mm/px · 4 of 4 slices shown]
[im 1/4]
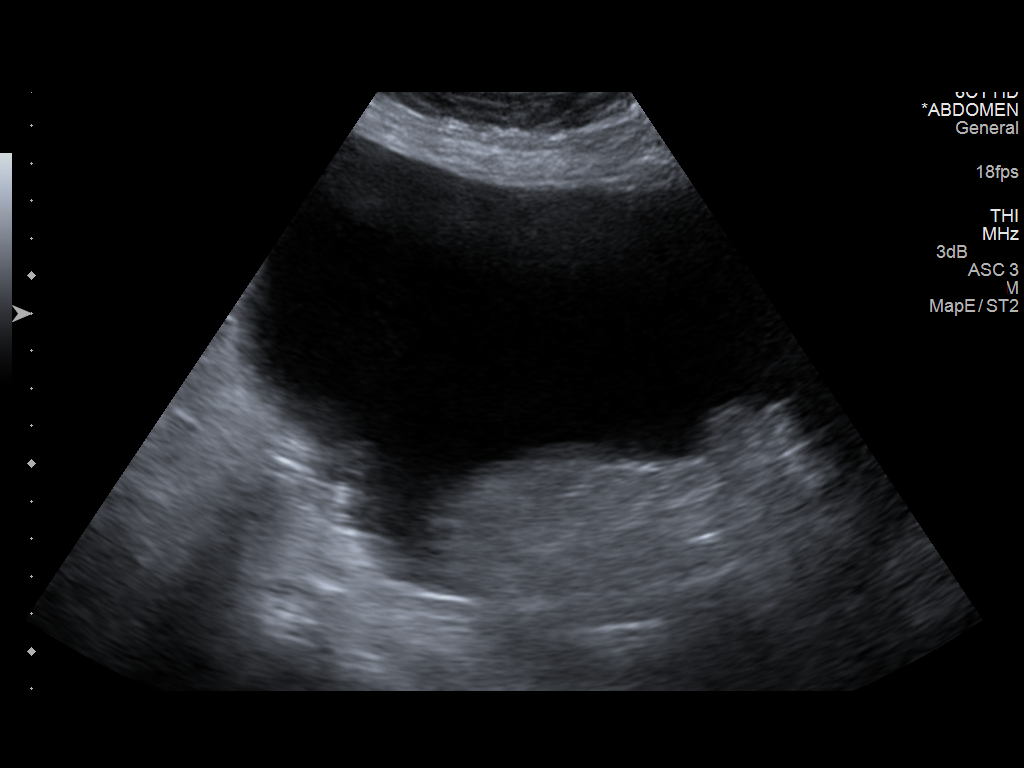
[im 2/4]
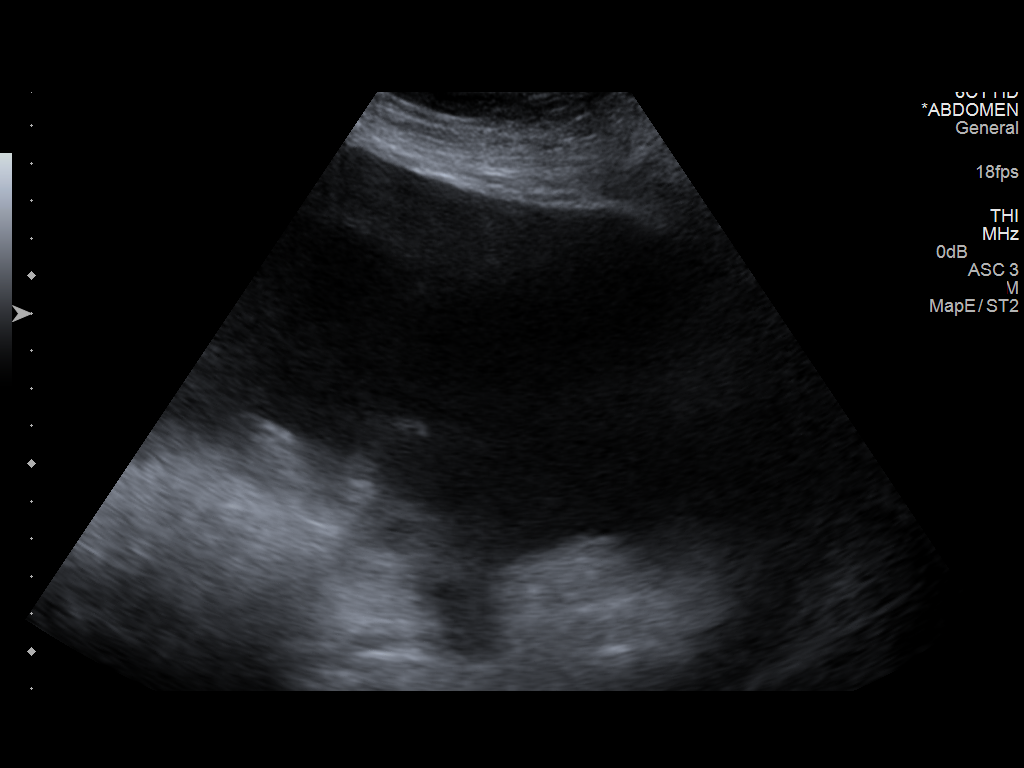
[im 3/4]
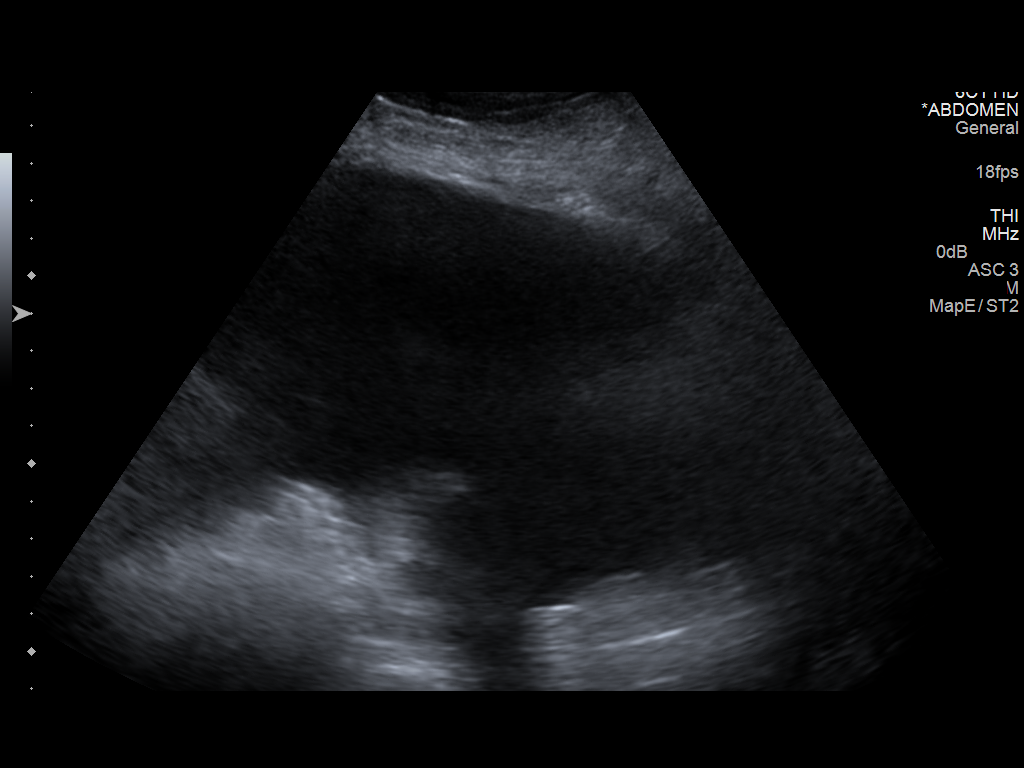
[im 4/4]
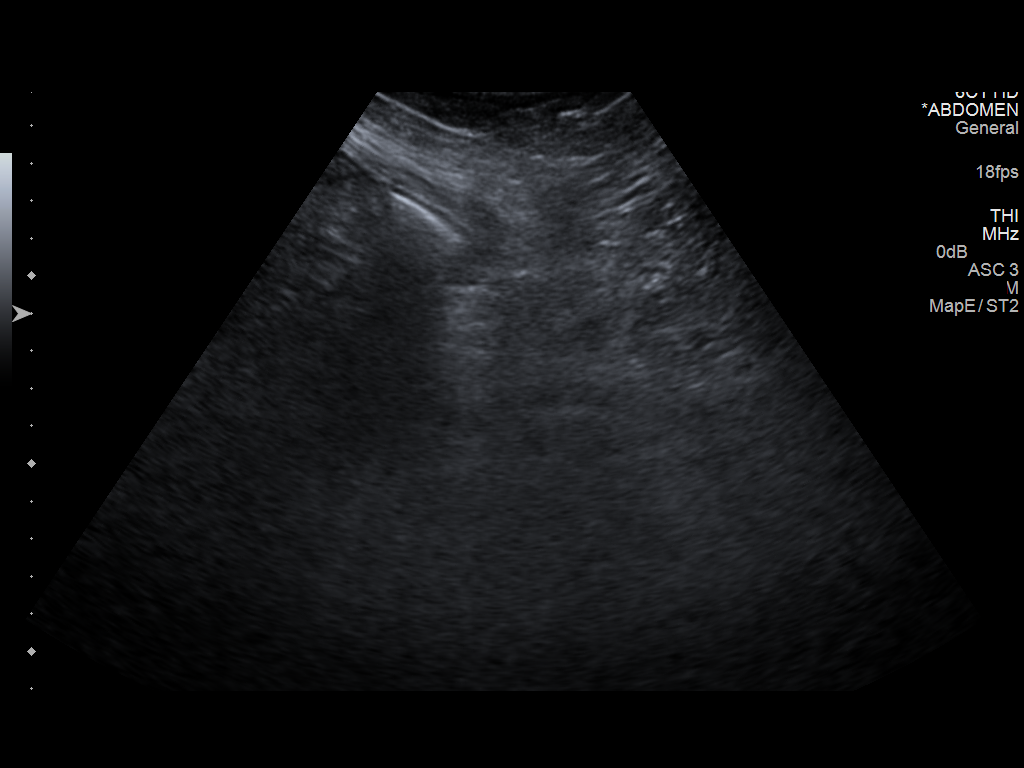

[4 of 4 positions shown; findings below may reference images not displayed]

Initial ultrasound scanning demonstrates a large amount of ascites
within the right lower abdominal quadrant. The right lower abdomen
was prepped and draped in the usual sterile fashion. 1% lidocaine
with epinephrine was used for local anesthesia. Under direct
ultrasound guidance, a 19 gauge, 7-cm, Yueh catheter was introduced.
An ultrasound image was saved for documentation purposed. The
paracentesis was performed. The catheter was removed and a dressing
was applied. The patient tolerated the procedure well without
immediate post procedural complication.
FINDINGS: A total of approximately 3.8 liters of yellow fluid was removed.
Samples were sent to the laboratory as requested by the clinical
team.
IMPRESSION: Successful ultrasound-guided paracentesis yielding 3.8 liters of
peritoneal fluid.

50 gr IV albumin during procedure per PANAYIA.

Read by:  Itz Classic Portable

## 2016-07-31 IMAGING — CR DG ABD PORTABLE 1V
1 series · 2 of 2 positions shown · non-contrast
Comparison: CT scan dated 09/12/2014

CLINICAL DATA: Abdominal distention and pain.  Ascites.

EXAM:
PORTABLE ABDOMEN - 1 VIEW

[Series 1: AP · U · 2 of 2 slices shown]
[im 1/2]
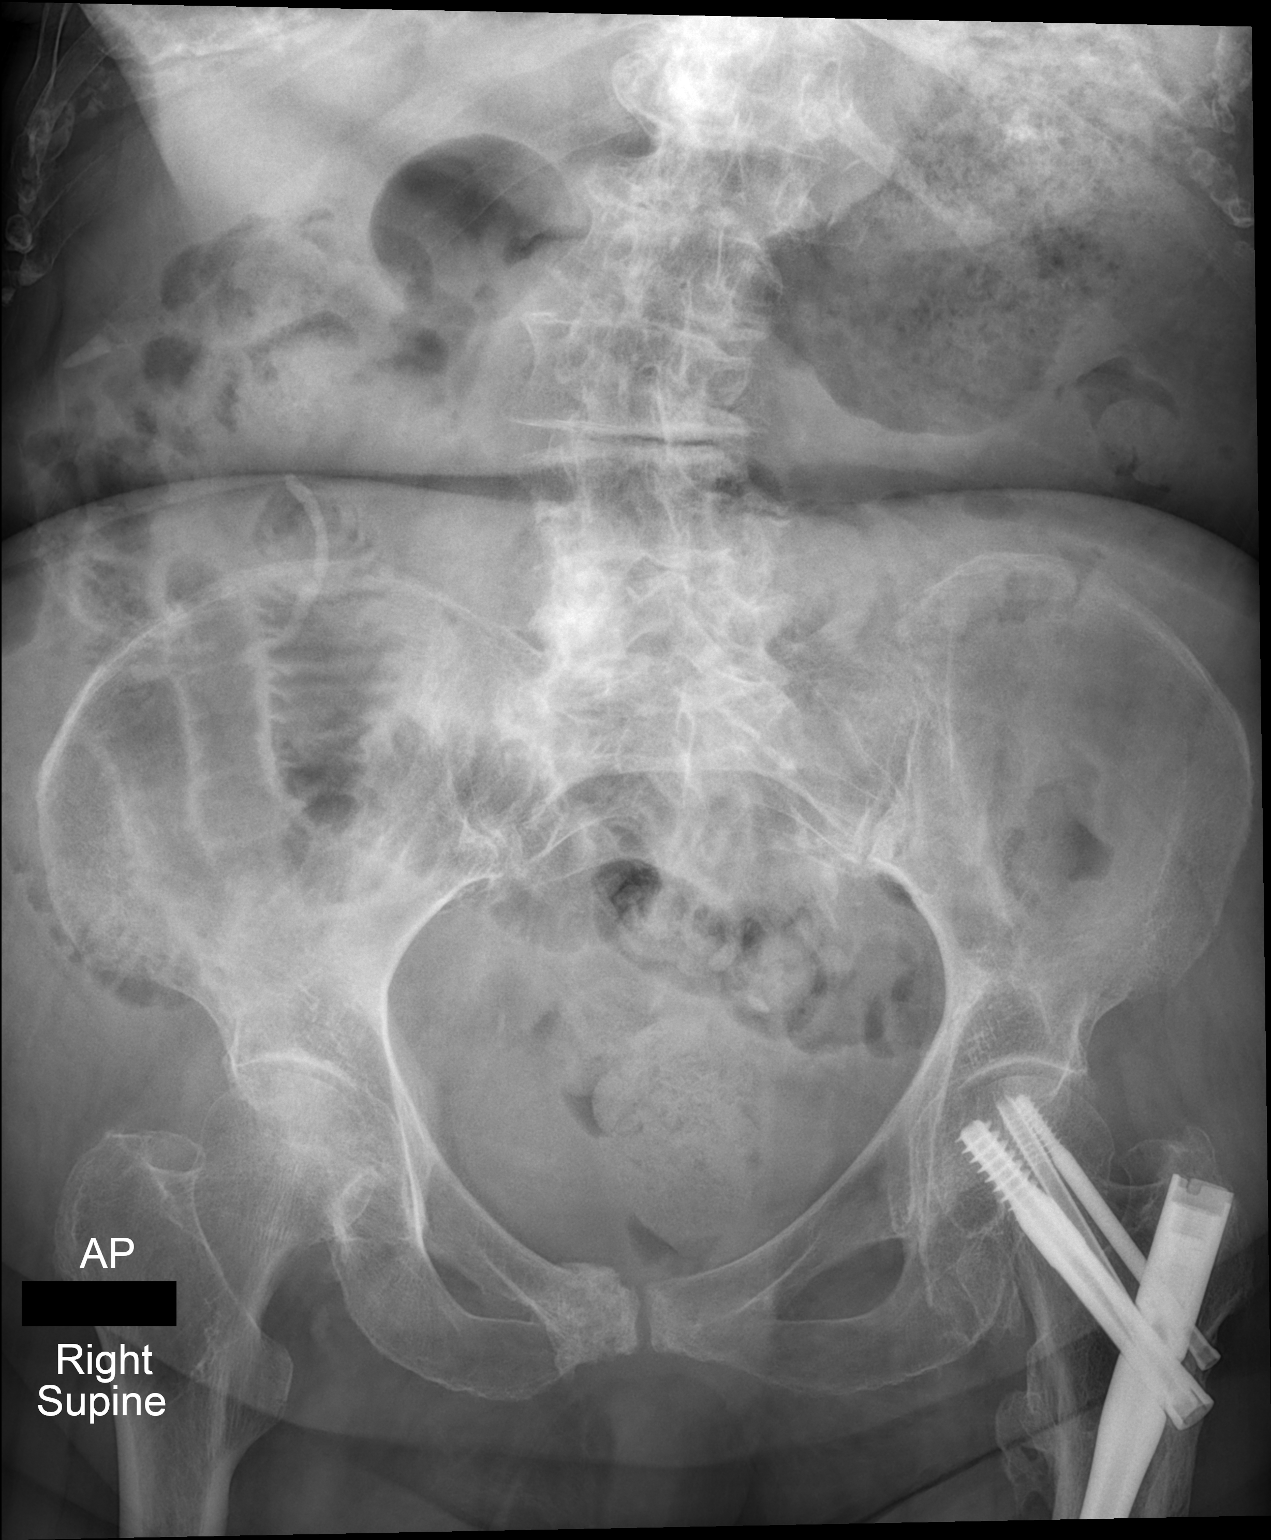
[im 2/2]
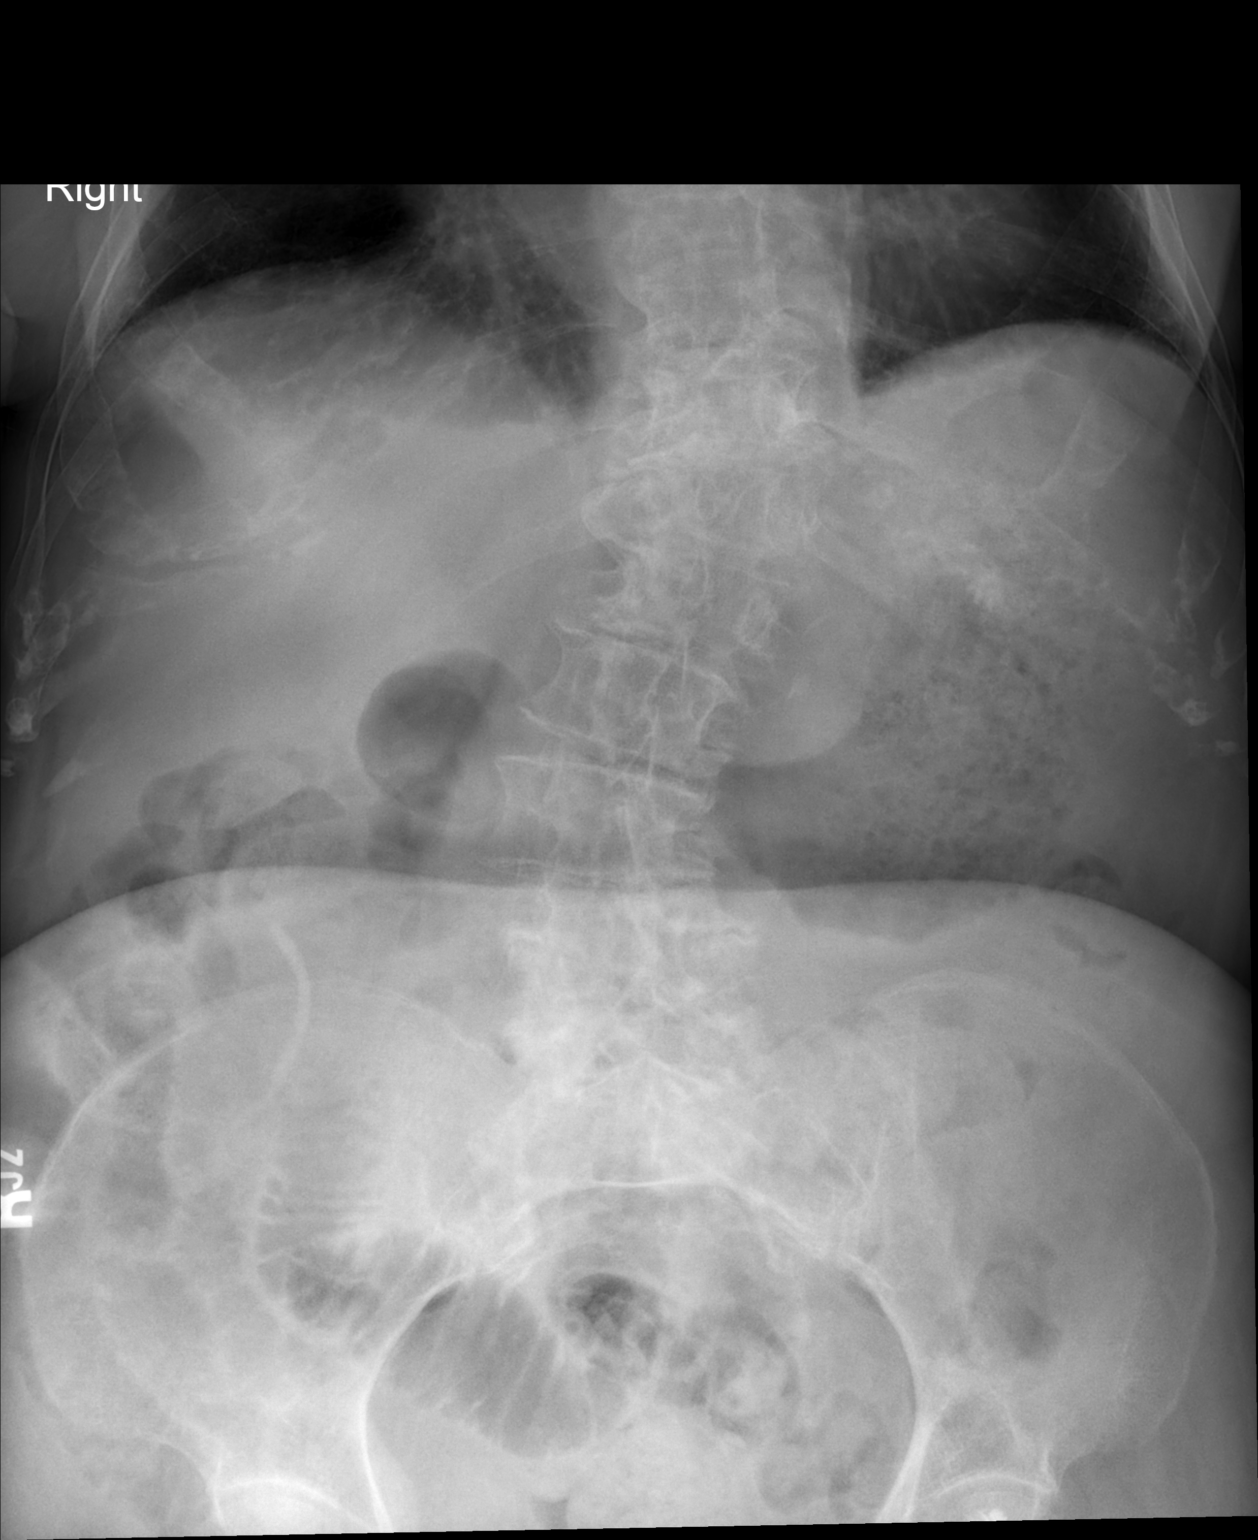

[2 of 2 positions shown; findings below may reference images not displayed]

FINDINGS: Bowel gas pattern is normal. Residual contrast in the appendix from
the recent CT scan. Diffuse degenerative changes and scoliosis in
the lumbar spine. Chronic degenerative or posttraumatic changes of
the right pubic body. No acute osseous abnormality. Healed left hip
fracture.
IMPRESSION: No acute abnormality of the abdomen.
